# Patient Record
Sex: Female | Born: 1945
Health system: Southern US, Community
[De-identification: ages and names within clinical notes are randomized; demographics above are authoritative.]

## PROBLEM LIST (undated history)

## (undated) DIAGNOSIS — F32A Depression, unspecified: Secondary | ICD-10-CM

## (undated) DIAGNOSIS — H269 Unspecified cataract: Secondary | ICD-10-CM

## (undated) DIAGNOSIS — R569 Unspecified convulsions: Secondary | ICD-10-CM

## (undated) DIAGNOSIS — R519 Headache, unspecified: Secondary | ICD-10-CM

## (undated) DIAGNOSIS — M8580 Other specified disorders of bone density and structure, unspecified site: Secondary | ICD-10-CM

## (undated) DIAGNOSIS — F419 Anxiety disorder, unspecified: Secondary | ICD-10-CM

## (undated) DIAGNOSIS — N189 Chronic kidney disease, unspecified: Secondary | ICD-10-CM

## (undated) DIAGNOSIS — R51 Headache: Secondary | ICD-10-CM

## (undated) DIAGNOSIS — M1711 Unilateral primary osteoarthritis, right knee: Secondary | ICD-10-CM

## (undated) DIAGNOSIS — F329 Major depressive disorder, single episode, unspecified: Secondary | ICD-10-CM

## (undated) DIAGNOSIS — E785 Hyperlipidemia, unspecified: Secondary | ICD-10-CM

## (undated) DIAGNOSIS — I10 Essential (primary) hypertension: Secondary | ICD-10-CM

## (undated) DIAGNOSIS — T7840XA Allergy, unspecified, initial encounter: Secondary | ICD-10-CM

## (undated) HISTORY — DX: Unspecified convulsions: R56.9

## (undated) HISTORY — DX: Unspecified cataract: H26.9

## (undated) HISTORY — DX: Chronic kidney disease, unspecified: N18.9

## (undated) HISTORY — DX: Allergy, unspecified, initial encounter: T78.40XA

## (undated) HISTORY — PX: EYE SURGERY: SHX253

## (undated) HISTORY — PX: FRACTURE SURGERY: SHX138

## (undated) HISTORY — PX: LASIK: SHX215

## (undated) HISTORY — PX: JOINT REPLACEMENT: SHX530

## (undated) HISTORY — PX: BREAST EXCISIONAL BIOPSY: SUR124

---

## 1978-02-13 HISTORY — PX: TUBAL LIGATION: SHX77

## 1995-02-14 HISTORY — PX: BREAST LUMPECTOMY: SHX2

## 2009-02-13 HISTORY — PX: MENISCUS REPAIR: SHX5179

## 2013-02-13 HISTORY — PX: CARPAL TUNNEL RELEASE: SHX101

## 2013-02-13 HISTORY — PX: OTHER SURGICAL HISTORY: SHX169

## 2013-02-25 DIAGNOSIS — J069 Acute upper respiratory infection, unspecified: Secondary | ICD-10-CM | POA: Diagnosis not present

## 2013-04-22 DIAGNOSIS — J0100 Acute maxillary sinusitis, unspecified: Secondary | ICD-10-CM | POA: Diagnosis not present

## 2013-06-11 DIAGNOSIS — L821 Other seborrheic keratosis: Secondary | ICD-10-CM | POA: Diagnosis not present

## 2013-06-11 DIAGNOSIS — D2330 Other benign neoplasm of skin of unspecified part of face: Secondary | ICD-10-CM | POA: Diagnosis not present

## 2013-06-30 DIAGNOSIS — M19049 Primary osteoarthritis, unspecified hand: Secondary | ICD-10-CM | POA: Diagnosis not present

## 2013-06-30 DIAGNOSIS — K219 Gastro-esophageal reflux disease without esophagitis: Secondary | ICD-10-CM | POA: Diagnosis not present

## 2013-06-30 DIAGNOSIS — G5600 Carpal tunnel syndrome, unspecified upper limb: Secondary | ICD-10-CM | POA: Diagnosis not present

## 2013-06-30 DIAGNOSIS — I10 Essential (primary) hypertension: Secondary | ICD-10-CM | POA: Diagnosis not present

## 2013-06-30 DIAGNOSIS — Z01810 Encounter for preprocedural cardiovascular examination: Secondary | ICD-10-CM | POA: Diagnosis not present

## 2013-07-01 DIAGNOSIS — K219 Gastro-esophageal reflux disease without esophagitis: Secondary | ICD-10-CM | POA: Diagnosis not present

## 2013-07-01 DIAGNOSIS — M6520 Calcific tendinitis, unspecified site: Secondary | ICD-10-CM | POA: Diagnosis not present

## 2013-07-01 DIAGNOSIS — M19049 Primary osteoarthritis, unspecified hand: Secondary | ICD-10-CM | POA: Diagnosis not present

## 2013-07-01 DIAGNOSIS — G5600 Carpal tunnel syndrome, unspecified upper limb: Secondary | ICD-10-CM | POA: Diagnosis not present

## 2013-07-01 DIAGNOSIS — I10 Essential (primary) hypertension: Secondary | ICD-10-CM | POA: Diagnosis not present

## 2013-09-16 DIAGNOSIS — J069 Acute upper respiratory infection, unspecified: Secondary | ICD-10-CM | POA: Diagnosis not present

## 2013-10-31 DIAGNOSIS — Z23 Encounter for immunization: Secondary | ICD-10-CM | POA: Diagnosis not present

## 2014-02-24 DIAGNOSIS — D235 Other benign neoplasm of skin of trunk: Secondary | ICD-10-CM | POA: Diagnosis not present

## 2014-03-02 DIAGNOSIS — Z1231 Encounter for screening mammogram for malignant neoplasm of breast: Secondary | ICD-10-CM | POA: Diagnosis not present

## 2014-06-21 LAB — LIPID PANEL
CHOLESTEROL: 132 mg/dL (ref 0–200)
HDL: 51 mg/dL (ref 35–70)
LDL CALC: 61 mg/dL

## 2014-06-21 LAB — BASIC METABOLIC PANEL
BUN: 15 mg/dL (ref 4–21)
CREATININE: 1 mg/dL (ref 0.5–1.1)
GLUCOSE: 102 mg/dL

## 2014-06-21 LAB — HEPATIC FUNCTION PANEL
ALT: 30 U/L (ref 7–35)
AST: 32 U/L (ref 13–35)

## 2014-07-01 DIAGNOSIS — J309 Allergic rhinitis, unspecified: Secondary | ICD-10-CM | POA: Diagnosis not present

## 2014-07-01 DIAGNOSIS — F418 Other specified anxiety disorders: Secondary | ICD-10-CM | POA: Diagnosis not present

## 2014-07-01 DIAGNOSIS — I10 Essential (primary) hypertension: Secondary | ICD-10-CM | POA: Diagnosis not present

## 2014-07-01 DIAGNOSIS — M150 Primary generalized (osteo)arthritis: Secondary | ICD-10-CM | POA: Diagnosis not present

## 2014-07-01 DIAGNOSIS — Z23 Encounter for immunization: Secondary | ICD-10-CM | POA: Diagnosis not present

## 2014-07-01 DIAGNOSIS — E785 Hyperlipidemia, unspecified: Secondary | ICD-10-CM | POA: Diagnosis not present

## 2014-07-01 DIAGNOSIS — G4700 Insomnia, unspecified: Secondary | ICD-10-CM | POA: Diagnosis not present

## 2014-07-01 DIAGNOSIS — K219 Gastro-esophageal reflux disease without esophagitis: Secondary | ICD-10-CM | POA: Diagnosis not present

## 2014-08-05 DIAGNOSIS — Z124 Encounter for screening for malignant neoplasm of cervix: Secondary | ICD-10-CM | POA: Diagnosis not present

## 2014-10-26 DIAGNOSIS — Z23 Encounter for immunization: Secondary | ICD-10-CM | POA: Diagnosis not present

## 2014-11-12 DIAGNOSIS — J0100 Acute maxillary sinusitis, unspecified: Secondary | ICD-10-CM | POA: Diagnosis not present

## 2014-11-12 DIAGNOSIS — R030 Elevated blood-pressure reading, without diagnosis of hypertension: Secondary | ICD-10-CM | POA: Diagnosis not present

## 2014-11-19 DIAGNOSIS — K219 Gastro-esophageal reflux disease without esophagitis: Secondary | ICD-10-CM | POA: Diagnosis not present

## 2014-11-19 DIAGNOSIS — F418 Other specified anxiety disorders: Secondary | ICD-10-CM | POA: Diagnosis not present

## 2014-11-19 DIAGNOSIS — M150 Primary generalized (osteo)arthritis: Secondary | ICD-10-CM | POA: Diagnosis not present

## 2014-11-19 DIAGNOSIS — J0101 Acute recurrent maxillary sinusitis: Secondary | ICD-10-CM | POA: Diagnosis not present

## 2014-11-19 DIAGNOSIS — J301 Allergic rhinitis due to pollen: Secondary | ICD-10-CM | POA: Diagnosis not present

## 2014-11-19 DIAGNOSIS — G4700 Insomnia, unspecified: Secondary | ICD-10-CM | POA: Diagnosis not present

## 2014-12-04 DIAGNOSIS — F3341 Major depressive disorder, recurrent, in partial remission: Secondary | ICD-10-CM | POA: Diagnosis not present

## 2014-12-04 DIAGNOSIS — F411 Generalized anxiety disorder: Secondary | ICD-10-CM | POA: Diagnosis not present

## 2014-12-16 DIAGNOSIS — F411 Generalized anxiety disorder: Secondary | ICD-10-CM | POA: Diagnosis not present

## 2014-12-16 DIAGNOSIS — F3341 Major depressive disorder, recurrent, in partial remission: Secondary | ICD-10-CM | POA: Diagnosis not present

## 2014-12-25 DIAGNOSIS — F3341 Major depressive disorder, recurrent, in partial remission: Secondary | ICD-10-CM | POA: Diagnosis not present

## 2014-12-25 DIAGNOSIS — F411 Generalized anxiety disorder: Secondary | ICD-10-CM | POA: Diagnosis not present

## 2015-01-01 DIAGNOSIS — J301 Allergic rhinitis due to pollen: Secondary | ICD-10-CM | POA: Diagnosis not present

## 2015-01-01 DIAGNOSIS — M8580 Other specified disorders of bone density and structure, unspecified site: Secondary | ICD-10-CM | POA: Diagnosis not present

## 2015-01-01 DIAGNOSIS — G4700 Insomnia, unspecified: Secondary | ICD-10-CM | POA: Diagnosis not present

## 2015-01-01 DIAGNOSIS — M150 Primary generalized (osteo)arthritis: Secondary | ICD-10-CM | POA: Diagnosis not present

## 2015-01-01 DIAGNOSIS — K219 Gastro-esophageal reflux disease without esophagitis: Secondary | ICD-10-CM | POA: Diagnosis not present

## 2015-01-01 DIAGNOSIS — E785 Hyperlipidemia, unspecified: Secondary | ICD-10-CM | POA: Diagnosis not present

## 2015-01-01 DIAGNOSIS — N951 Menopausal and female climacteric states: Secondary | ICD-10-CM | POA: Diagnosis not present

## 2015-01-01 DIAGNOSIS — I10 Essential (primary) hypertension: Secondary | ICD-10-CM | POA: Diagnosis not present

## 2015-01-01 DIAGNOSIS — F418 Other specified anxiety disorders: Secondary | ICD-10-CM | POA: Diagnosis not present

## 2015-03-10 DIAGNOSIS — I10 Essential (primary) hypertension: Secondary | ICD-10-CM | POA: Diagnosis not present

## 2015-03-10 DIAGNOSIS — K219 Gastro-esophageal reflux disease without esophagitis: Secondary | ICD-10-CM | POA: Diagnosis not present

## 2015-03-10 DIAGNOSIS — E785 Hyperlipidemia, unspecified: Secondary | ICD-10-CM | POA: Diagnosis not present

## 2015-03-10 DIAGNOSIS — M25561 Pain in right knee: Secondary | ICD-10-CM | POA: Diagnosis not present

## 2015-03-10 DIAGNOSIS — G4700 Insomnia, unspecified: Secondary | ICD-10-CM | POA: Diagnosis not present

## 2015-03-10 DIAGNOSIS — M150 Primary generalized (osteo)arthritis: Secondary | ICD-10-CM | POA: Diagnosis not present

## 2015-06-14 ENCOUNTER — Ambulatory Visit (INDEPENDENT_AMBULATORY_CARE_PROVIDER_SITE_OTHER): Payer: Medicare Other | Admitting: Family Medicine

## 2015-06-14 ENCOUNTER — Emergency Department (INDEPENDENT_AMBULATORY_CARE_PROVIDER_SITE_OTHER)
Admission: EM | Admit: 2015-06-14 | Discharge: 2015-06-14 | Payer: Medicare Other | Source: Home / Self Care | Attending: Emergency Medicine | Admitting: Emergency Medicine

## 2015-06-14 ENCOUNTER — Encounter: Payer: Self-pay | Admitting: Emergency Medicine

## 2015-06-14 ENCOUNTER — Encounter: Payer: Self-pay | Admitting: Family Medicine

## 2015-06-14 ENCOUNTER — Emergency Department (INDEPENDENT_AMBULATORY_CARE_PROVIDER_SITE_OTHER): Payer: Medicare Other

## 2015-06-14 VITALS — BP 151/84 | HR 86 | Wt 193.0 lb

## 2015-06-14 DIAGNOSIS — F419 Anxiety disorder, unspecified: Secondary | ICD-10-CM | POA: Insufficient documentation

## 2015-06-14 DIAGNOSIS — M25561 Pain in right knee: Secondary | ICD-10-CM | POA: Diagnosis not present

## 2015-06-14 DIAGNOSIS — G4700 Insomnia, unspecified: Secondary | ICD-10-CM | POA: Diagnosis not present

## 2015-06-14 DIAGNOSIS — F5105 Insomnia due to other mental disorder: Secondary | ICD-10-CM

## 2015-06-14 HISTORY — DX: Hyperlipidemia, unspecified: E78.5

## 2015-06-14 HISTORY — DX: Other specified disorders of bone density and structure, unspecified site: M85.80

## 2015-06-14 HISTORY — DX: Essential (primary) hypertension: I10

## 2015-06-14 MED ORDER — TRAZODONE HCL 50 MG PO TABS: 25.0000 mg | ORAL_TABLET | Freq: Every evening | ORAL | Status: DC | PRN

## 2015-06-14 NOTE — Progress Notes (Signed)
Subjective:    I'm seeing this patient as a consultation for:  Marcene Brawn PA-C  CC: Right knee pain  HPI: Patient is a 4 month history of right medial knee pain associated with swelling. She denies any injury but notes that her pain started after she moved house and a lot of packing bending and squatting pushing and pulling. She last had a knee injection in January and [place] at her former PCPs office. She's tried some over-the-counter medicines which helped only a little. She also notes a history of medial degenerative meniscus tear with arthroscopic partial meniscectomy. This helped for a little while as well. He denies any locking catching or giving way. Pain is daily and interfere with her ability to do normal activities.  Past medical history, Surgical history, Family history not pertinant except as noted below, Social history, Allergies, and medications have been entered into the medical record, reviewed, and no changes needed.   Review of Systems: No headache, visual changes, nausea, vomiting, diarrhea, constipation, dizziness, abdominal pain, skin rash, fevers, chills, night sweats, weight loss, swollen lymph nodes, body aches, joint swelling, muscle aches, chest pain, shortness of breath, mood changes, visual or auditory hallucinations.   Objective:    Filed Vitals:   06/14/15 0943  BP: 151/84  Pulse: 86   General: Well Developed, well nourished, and in no acute distress.  Neuro/Psych: Alert and oriented x3, extra-ocular muscles intact, able to move all 4 extremities, sensation grossly intact. Skin: Warm and dry, no rashes noted.  Respiratory: Not using accessory muscles, speaking in full sentences, trachea midline.  Cardiovascular: Pulses palpable, no extremity edema. Abdomen: Does not appear distended. MSK: Right knee is normal-appearing with mild effusion. Range of motion 0-120 with 1+ retropatellar crepitations. Tender to palpation medial joint line. Mildly  positive medial McMurray's test. Stable valgus and varus stress. Stable and negative anterior posterior drawer test. Normal gait.  Procedure: Real-time Ultrasound Guided Injection of right knee  Device: GE Logiq E  Images permanently stored and available for review in the ultrasound unit. Verbal informed consent obtained. Discussed risks and benefits of procedure. Warned about infection bleeding damage to structures skin hypopigmentation and fat atrophy among others. Patient expresses understanding and agreement Time-out conducted.  Noted no overlying erythema, induration, or other signs of local infection.  Skin prepped in a sterile fashion.  Local anesthesia: Topical Ethyl chloride.  With sterile technique and under real time ultrasound guidance: 80 mg of Kenalog and 4 mL of Marcaine injected easily.  Completed without difficulty  Pain immediately resolved suggesting accurate placement of the medication.  Advised to call if fevers/chills, erythema, induration, drainage, or persistent bleeding.  Images permanently stored and available for review in the ultrasound unit.  Impression: Technically successful ultrasound guided injection.    No results found for this or any previous visit (from the past 24 hour(s)). Dg Knee Complete 4 Views Right  06/14/2015  CLINICAL DATA:  RIGHT knee pain for a few months since packing and unpacking her house in January, history of meniscal tear and fracture EXAM: RIGHT KNEE - COMPLETE 4+ VIEW COMPARISON:  None FINDINGS: Osseous demineralization. Tricompartmental osteoarthritic changes with joint space narrowing and spur formation particularly at medial compartment. No acute fracture, dislocation or bone destruction. Calcified ossicle posteriorly at the knee joint on lateral view, likely at lateral aspect of medial compartment, question ununited ossicle versus old fracture fragment. No knee joint effusion or regional soft tissue abnormalities.  IMPRESSION: Osseous demineralization with tricompartmental osteoarthritic changes  as above. No acute abnormalities. Electronically Signed   By: Lavonia Dana M.D.   On: 06/14/2015 09:44    Impression and Recommendations:   This case required medical decision making of moderate complexity.

## 2015-06-14 NOTE — ED Notes (Signed)
Rt knee pain, Had meniscus repair 5 yrs ago, in January this year was packing boxes for move here, had injection for pain helped temporarily but hurts intermittently, affecting her life, painful, slightly swollen, feels full, 5/10

## 2015-06-14 NOTE — ED Provider Notes (Signed)
CSN: [id number]     Arrival date & time 06/14/15  [id number] History   First MD Initiated Contact with Patient 06/14/15 [phone]     Chief Complaint  Patient presents with  . Knee Pain   (Consider location/radiation/quality/duration/timing/severity/associated sxs/prior Treatment) Patient is a 70 y.o. female presenting with knee pain. The history is provided by the patient. No language interpreter was used.  Knee Pain Location:  Knee Time since incident:  4 months Injury: no   Knee location:  R knee Pain details:    Quality:  Aching   Radiates to:  Does not radiate   Severity:  Moderate   Onset quality:  Gradual   Timing:  Constant Chronicity:  New Dislocation: no   Foreign body present:  No foreign bodies Relieved by:  Nothing Worsened by:  Nothing tried Ineffective treatments:  None tried Associated symptoms: decreased ROM and stiffness   Risk factors: no concern for non-accidental trauma and no recent illness   pain is interferring with walking and activities.  Pt previously had a cortisone shot at her MD in [place].  Pt moved here.  Past Medical History  Diagnosis Date  . Hypertension   . Hyperlipidemia   . Osteopenia    Past Surgical History  Procedure Laterality Date  . Meniscus repair    . Trigger thumb    . Carpal tunnel release    . Breast lumpectomy     Family History  Problem Relation Age of Onset  . Heart failure Mother    Social History  Substance Use Topics  . Smoking status: Never Smoker   . Smokeless tobacco: None  . Alcohol Use: Yes   OB History    No data available     Review of Systems  Musculoskeletal: Positive for stiffness.  All other systems reviewed and are negative.   Allergies  Penicillins and Sulfa antibiotics  Home Medications   Prior to Admission medications   Medication Sig Start Date End Date Taking? Authorizing Provider  acetaminophen (TYLENOL) 325 MG tablet Take 650 mg by mouth every 6 (six) hours as needed.   Yes Historical  Provider, MD  atorvastatin (LIPITOR) 20 MG tablet Take 20 mg by mouth daily.   Yes Historical Provider, MD  fluticasone (FLONASE) 50 MCG/ACT nasal spray Place into both nostrils daily.   Yes Historical Provider, MD  ibuprofen (ADVIL,MOTRIN) 200 MG tablet Take 200 mg by mouth every 6 (six) hours as needed.   Yes Historical Provider, MD  raloxifene (EVISTA) 60 MG tablet Take 60 mg by mouth daily.   Yes Historical Provider, MD  triamterene-hydrochlorothiazide (DYAZIDE) 37.5-25 MG capsule Take 1 capsule by mouth daily.   Yes Historical Provider, MD  venlafaxine XR (EFFEXOR-XR) 150 MG 24 hr capsule Take 150 mg by mouth daily with breakfast.   Yes Historical Provider, MD   Meds Ordered and Administered this Visit  Medications - No data to display  BP 145/75 mmHg  Pulse 95  Temp(Src) 97.8 F (36.6 C) (Oral)  Ht 5\' 3"  (1.6 m)  Wt 190 lb (86.183 kg)  BMI 33.67 kg/m2  SpO2 98% No data found.   Physical Exam  Constitutional: She appears well-developed and well-nourished.  HENT:  Head: Normocephalic.  Musculoskeletal: Normal range of motion. She exhibits tenderness.  Neurological: She is alert.  Skin: Skin is warm.  Psychiatric: She has a normal mood and affect.  Nursing note and vitals reviewed.   ED Course  Procedures (including critical care time)  Labs Review Labs  Reviewed - No data to display  Imaging Review Dg Knee Complete 4 Views Right  06/14/2015  CLINICAL DATA:  RIGHT knee pain for a few months since packing and unpacking her house in January, history of meniscal tear and fracture EXAM: RIGHT KNEE - COMPLETE 4+ VIEW COMPARISON:  None FINDINGS: Osseous demineralization. Tricompartmental osteoarthritic changes with joint space narrowing and spur formation particularly at medial compartment. No acute fracture, dislocation or bone destruction. Calcified ossicle posteriorly at the knee joint on lateral view, likely at lateral aspect of medial compartment, question ununited ossicle  versus old fracture fragment. No knee joint effusion or regional soft tissue abnormalities. IMPRESSION: Osseous demineralization with tricompartmental osteoarthritic changes as above. No acute abnormalities. Electronically Signed   By: Lavonia Dana M.D.   On: 06/14/2015 09:44     Visual Acuity Review  Right Eye Distance:   Left Eye Distance:   Bilateral Distance:    Right Eye Near:   Left Eye Near:    Bilateral Near:         MDM Xray obtained,  Dr. Georgina Snell has appointment this am.   Pt to see Dr. Georgina Snell for evaluation now   1. Right knee pain    Discharge by Dr. Alphonzo Cruise, PA-C 06/14/15 [phone]

## 2015-06-14 NOTE — Assessment & Plan Note (Signed)
I believe the majority of the pain is due to exacerbation of his existing DJD. This highly possible that she has a degenerative medial meniscus tear but given her age and the existence of significant DJD I am doubtful that she would benefit much from arthroscopic partial meniscectomy. Plan for a trial of steroid injection today. Additionally will work on quad strengthening exercises. Recheck in about a month. If not better would consider viscous supplementation trial.

## 2015-06-14 NOTE — Patient Instructions (Signed)
Thank you for coming in today. Take trazodone for insomnia as needed.  Do the exercises for you leg.  Return in 1 month for recheck.  Follow up with Me or Dr Sheppard Coil to establish care for your medical problems.   Call or go to the ER if you develop a large red swollen joint with extreme pain or oozing puss.

## 2015-06-28 ENCOUNTER — Telehealth: Payer: Self-pay | Admitting: Family Medicine

## 2015-06-28 ENCOUNTER — Encounter: Payer: Self-pay | Admitting: Family Medicine

## 2015-06-28 ENCOUNTER — Ambulatory Visit (INDEPENDENT_AMBULATORY_CARE_PROVIDER_SITE_OTHER): Payer: Medicare Other | Admitting: Family Medicine

## 2015-06-28 VITALS — BP 139/83 | HR 100 | Wt 195.0 lb

## 2015-06-28 DIAGNOSIS — M25561 Pain in right knee: Secondary | ICD-10-CM | POA: Diagnosis not present

## 2015-06-28 NOTE — Patient Instructions (Signed)
Thank you for coming in today. We will try orthovisc.  Expect a call this week about the when it will be done.   Sodium Hyaluronate intra-articular injection What is this medicine? SODIUM HYALURONATE (SOE dee um hye al yoor ON ate) is used to treat pain in the knee due to osteoarthritis. This medicine may be used for other purposes; ask your health care provider or pharmacist if you have questions. What should I tell my health care provider before I take this medicine? They need to know if you have any of these conditions: -bleeding disorders -glaucoma -infection in the knee joint -skin conditions or sensitivity -skin infection -an unusual allergic reaction to sodium hyaluronate, other medicines, foods, dyes, or preservatives. Different brands of sodium hyaluronate contain different allergens. Some may contain egg. Talk to your doctor about your allergies to make sure that you get the right product. -pregnant or trying to get pregnant -breast-feeding How should I use this medicine? This medicine is for injection into the knee joint. It is given by a health care professional in a hospital or clinic setting. Talk to your pediatrician regarding the use of this medicine in children. Special care may be needed. Overdosage: If you think you have taken too much of this medicine contact a poison control center or emergency room at once. NOTE: This medicine is only for you. Do not share this medicine with others. What if I miss a dose? This does not apply. What may interact with this medicine? Interactions are not expected. This list may not describe all possible interactions. Give your health care provider a list of all the medicines, herbs, non-prescription drugs, or dietary supplements you use. Also tell them if you smoke, drink alcohol, or use illegal drugs. Some items may interact with your medicine. What should I watch for while using this medicine? Tell your doctor or healthcare  professional if your symptoms do not start to get better or if they get worse. If receiving this medicine for osteoarthritis, limit your activity after you receive your injection. Avoid physical activity for 48 hours following your injection to keep your knee from swelling. Do not stand on your feet for more than 1 hour at a time during the first 48 hours following your injection. Ask your doctor or healthcare professional about when you can begin major physical activity again. What side effects may I notice from receiving this medicine? Side effects that you should report to your doctor or health care professional as soon as possible: -allergic reactions like skin rash, itching or hives, swelling of the face, lips, or tongue -dizziness -facial flushing -pain, tingling, numbness in the hands or feet -vision changes if received this medicine during eye surgery Side effects that usually do not require medical attention (Report these to your doctor or health care professional if they continue or are bothersome.): -back pain -bruising at site where injected -chills -diarrhea -fever -headache -joint pain -joint stiffness -joint swelling -muscle cramps -muscle pain -nausea, vomiting -pain, redness, or irritation at site where injected -weak or tired This list may not describe all possible side effects. Call your doctor for medical advice about side effects. You may report side effects to FDA at 1-800-FDA-1088. Where should I keep my medicine? This drug is given in a hospital or clinic and will not be stored at home. NOTE: This sheet is a summary. It may not cover all possible information. If you have questions about this medicine, talk to your doctor, pharmacist, or health care  provider.    2016, Elsevier/Gold Standard. (2014-03-10 11:10:34)

## 2015-06-28 NOTE — Telephone Encounter (Signed)
Submitted for approval on Orthovisc. Awaiting confirmation.  

## 2015-06-28 NOTE — Progress Notes (Signed)
       [name] is a 70 y.o. female who presents to [facility] today for right knee pain. Patient was seen in early May for right knee pain due to DJD. She received a steroid injection which had very little benefit. She notes chronic daily pain is interfering with her quality of life.   Past Medical History  Diagnosis Date  . Hypertension   . Hyperlipidemia   . Osteopenia    Past Surgical History  Procedure Laterality Date  . Meniscus repair    . Trigger thumb    . Carpal tunnel release    . Breast lumpectomy     Social History  Substance Use Topics  . Smoking status: Never Smoker   . Smokeless tobacco: Not on file  . Alcohol Use: Yes   family history includes Heart failure in her mother.  ROS as above Medications: Current Outpatient Prescriptions  Medication Sig Dispense Refill  . acetaminophen (TYLENOL) 325 MG tablet Take 650 mg by mouth every 6 (six) hours as needed.    Marland Kitchen atorvastatin (LIPITOR) 20 MG tablet Take 20 mg by mouth daily.    . fluticasone (FLONASE) 50 MCG/ACT nasal spray Place into both nostrils daily.    Marland Kitchen ibuprofen (ADVIL,MOTRIN) 200 MG tablet Take 200 mg by mouth every 6 (six) hours as needed.    . raloxifene (EVISTA) 60 MG tablet Take 60 mg by mouth daily.    . traZODone (DESYREL) 50 MG tablet Take 0.5-1 tablets (25-50 mg total) by mouth at bedtime as needed for sleep. 30 tablet 3  . triamterene-hydrochlorothiazide (DYAZIDE) 37.5-25 MG capsule Take 1 capsule by mouth daily.    Marland Kitchen venlafaxine XR (EFFEXOR-XR) 150 MG 24 hr capsule Take 150 mg by mouth daily with breakfast.     No current facility-administered medications for this visit.   Allergies  Allergen Reactions  . Penicillins   . Sulfa Antibiotics      Exam:  BP 139/83 mmHg  Pulse 100  Wt 195 lb (88.451 kg) Gen: Well NAD Right knee normal-appearing no effusion. Normal range of motion  without patellar crepitations. Normal gait.  X-ray shows significant tricompartmental DJD.  No results found for this or any previous visit (from the past 24 hour(s)). No results found.   23-year-old woman with right knee pain due to DJD likely. Patient has failed steroid injection. Plan for trial of Orthovisc. Return in the near future for Orthovisc injection series.

## 2015-06-29 NOTE — Telephone Encounter (Signed)
Received the following information from OV benefits investigation:   AS PER MEDICARE GUIDELINES, A STANDARD COURSE OF TREATMENT FOR ORTHOVISC is 3-4 INJECTIONS PER KNEE IN A 6 MONTH PERIOD. ADDITIONAL INJECTIONS MAY NOT BE CONSIDERED REASONABLE AND NECESSARY AND MAY DENY. IN ORDER FOR SERVICES TO BE APPROVED THERE MUST BE RADIOLOGICAL EVIDENCE TO SUPPORT THE DIAGNOSIS OF OSTEOARTHRITIS; AND PATIENT MUST HAVE TRIED AND FAILED WITH CONSERVATIVE FORMS OF TREATMENT. PLAN MAY REQUEST MEDICAL NOTES UPON RECEIPT OF CLAIM. FOLLOWS ALL MEDICARE GUIDELINES.   Left information on husband Fritz Pickerel) VM. The phone number we have on file for Pt is not working.

## 2015-06-29 NOTE — Telephone Encounter (Signed)
Pt is OK to schedule for OV injections, can start anytime.

## 2015-07-02 ENCOUNTER — Encounter: Payer: Self-pay | Admitting: Family Medicine

## 2015-07-02 ENCOUNTER — Ambulatory Visit (INDEPENDENT_AMBULATORY_CARE_PROVIDER_SITE_OTHER): Payer: Medicare Other | Admitting: Family Medicine

## 2015-07-02 VITALS — BP 157/74 | HR 101 | Wt 196.0 lb

## 2015-07-02 DIAGNOSIS — M25561 Pain in right knee: Secondary | ICD-10-CM | POA: Diagnosis not present

## 2015-07-02 NOTE — Patient Instructions (Signed)
Thank you for coming in today. Return in 1 week.  Call or go to the ER if you develop a large red swollen joint with extreme pain or oozing puss.   

## 2015-07-02 NOTE — Progress Notes (Signed)
Patient presents to clinic today for previously arranged Orthovisc injection.  Procedure: Real-time Ultrasound Guided Injection of right knee Orthovisc 1/4 Device: GE Logiq E  Images permanently stored and available for review in the ultrasound unit. Verbal informed consent obtained. Discussed risks and benefits of procedure. Warned about infection bleeding damage to structures skin hypopigmentation and fat atrophy among others. Patient expresses understanding and agreement Time-out conducted.  Noted no overlying erythema, induration, or other signs of local infection.  Skin prepped in a sterile fashion.  Local anesthesia: Topical Ethyl chloride.  With sterile technique and under real time ultrasound guidance: Orthovisc injected easily.  Completed without difficulty   Advised to call if fevers/chills, erythema, induration, drainage, or persistent bleeding.  Images permanently stored and available for review in the ultrasound unit.  Impression: Technically successful ultrasound guided injection.  Ultrasound guidance needed due to body habitus and to ensure accurate placement of medication.  Return to clinic in one week for Orthovisc injection 2/4.

## 2015-07-09 ENCOUNTER — Encounter: Payer: Self-pay | Admitting: Family Medicine

## 2015-07-09 ENCOUNTER — Ambulatory Visit (INDEPENDENT_AMBULATORY_CARE_PROVIDER_SITE_OTHER): Payer: Medicare Other | Admitting: Family Medicine

## 2015-07-09 VITALS — BP 131/78 | HR 96 | Wt 195.0 lb

## 2015-07-09 DIAGNOSIS — M25561 Pain in right knee: Secondary | ICD-10-CM | POA: Diagnosis not present

## 2015-07-09 NOTE — Progress Notes (Signed)
  Patient presents to clinic today for previously arranged Orthovisc injection.  Procedure: Real-time Ultrasound Guided Injection of right knee Orthovisc 2/4 Device: GE Logiq E  Images permanently stored and available for review in the ultrasound unit. Verbal informed consent obtained. Discussed risks and benefits of procedure. Warned about infection bleeding damage to structures skin hypopigmentation and fat atrophy among others. Patient expresses understanding and agreement Time-out conducted.  Noted no overlying erythema, induration, or other signs of local infection.  Skin prepped in a sterile fashion.  Local anesthesia: Topical Ethyl chloride.  With sterile technique and under real time ultrasound guidance: Orthovisc injected easily.  Completed without difficulty   Advised to call if fevers/chills, erythema, induration, drainage, or persistent bleeding.  Images permanently stored and available for review in the ultrasound unit.  Impression: Technically successful ultrasound guided injection.  Ultrasound guidance needed due to body habitus and to ensure accurate placement of medication.  Return to clinic in one week for Orthovisc injection 3/4.

## 2015-07-09 NOTE — Patient Instructions (Addendum)
Thank you for coming in today. Call or go to the ER if you develop a large red swollen joint with extreme pain or oozing puss.  Return in 1 week for injection 3/4

## 2015-07-15 ENCOUNTER — Ambulatory Visit: Payer: Medicare Other | Admitting: Family Medicine

## 2015-07-16 ENCOUNTER — Ambulatory Visit (INDEPENDENT_AMBULATORY_CARE_PROVIDER_SITE_OTHER): Payer: Medicare Other | Admitting: Family Medicine

## 2015-07-16 DIAGNOSIS — M25561 Pain in right knee: Secondary | ICD-10-CM

## 2015-07-16 NOTE — Patient Instructions (Signed)
Thank you for coming in today. You should hear from [facility] soon.   Total Knee Replacement Total knee replacement is a procedure to replace your knee joint with an artificial knee joint (prosthetic knee joint). The purpose of this surgery is to reduce pain and improve your knee function. LET [facility] CARE PROVIDER KNOW ABOUT:   Any allergies you have.  All medicines you are taking, including vitamins, herbs, eye drops, creams, and over-the-counter medicines.  Previous problems you or members of your family have had with the use of anesthetics.  Any blood disorders you have.  Previous surgeries you have had.  Medical conditions you have. RISKS AND COMPLICATIONS  Generally, total knee replacement is a safe procedure. However, problems can occur, including:  Loss of range of motion of the knee or instability of the knee.  Loosening of the prosthesis.  Infection.  Persistent pain. BEFORE THE PROCEDURE   Plan to have someone take you home after the procedure.  Do not eat or drink anything after midnight on the night before the procedure or as directed by your health care provider.  Ask your health care provider about:  Changing or stopping your regular medicines. This is especially important if you are taking diabetes medicines or blood thinners.  Taking medicines such as aspirin and ibuprofen. These medicines can thin your blood. Do not take these medicines before your procedure if your health care provider asks you not to.  Ask your health care provider about how your surgical site will be marked or identified.  You may be given antibiotic medicines to help prevent infection. PROCEDURE   To reduce your risk of infection:  Your health care team will wash or sanitize their hands.  Your skin will be washed with soap.  An IV tube will be inserted into one of your veins. You will be given one or more of the following:  A medicine that makes you drowsy  (sedative).  A medicine that makes you fall asleep (general anesthetic).  A medicine injected into your spine that numbs your body below the waist (spinal anesthetic).  A medicine to block feeling in your leg (nerve block) to help ease pain after surgery.  An incision will be made in your knee. Your surgeon will take out any damaged cartilage and bone by sawing off the damaged surfaces.  The surgeon will then put a new metal liner over the sawed-off portion of your thigh bone (femur) and a plastic liner over the sawed-off portion of one of the bones of your lower leg (tibia). This is to restore alignment and function to your knee. A plastic piece is often used to restore the surface of your knee cap. AFTER THE PROCEDURE   You will stay in a recovery area until your medicines wear off.  You may have drainage tubes to drain excess fluid from your knee. These tubes attach to a device that removes these fluids.  Once you are awake, stable, and taking fluids well, you will be taken to your hospital room.  You may be directed to take actions to help prevent blood clots. These may include:  Walking shortly after surgery, with someone assisting you. Moving around after surgery helps to improve blood flow.  Wearing compression stockings or using different types of devices.  You will receive physical therapy as prescribed by your health care provider.   This information is not intended to replace advice given to you by your health care provider. Make sure  you discuss any questions you have with your health care provider.   Document Released: 05/08/2000 Document Revised: 10/21/2014 Document Reviewed: 03/12/2011 Elsevier Interactive Patient Education [employer].

## 2015-07-18 NOTE — Progress Notes (Signed)
       [name] is a 70 y.o. female who presents to [facility]: [place] today for right knee pain.   Patient has had 2/4 injections for orthovisc for left knee pain though to be due to DJD. She has failed steroid injection and feels that she is not ANY better with the orthovisc injection series. She would like to stop the injections because they are not helping.    Past Medical History  Diagnosis Date  . Hypertension   . Hyperlipidemia   . Osteopenia    Past Surgical History  Procedure Laterality Date  . Meniscus repair    . Trigger thumb    . Carpal tunnel release    . Breast lumpectomy     Social History  Substance Use Topics  . Smoking status: Never Smoker   . Smokeless tobacco: Not on file  . Alcohol Use: Yes   family history includes Heart failure in her mother.  ROS as above:  Medications: Current Outpatient Prescriptions  Medication Sig Dispense Refill  . acetaminophen (TYLENOL) 325 MG tablet Take 650 mg by mouth every 6 (six) hours as needed.    Marland Kitchen atorvastatin (LIPITOR) 20 MG tablet Take 20 mg by mouth daily.    . fluticasone (FLONASE) 50 MCG/ACT nasal spray Place into both nostrils daily.    Marland Kitchen ibuprofen (ADVIL,MOTRIN) 200 MG tablet Take 200 mg by mouth every 6 (six) hours as needed.    . raloxifene (EVISTA) 60 MG tablet Take 60 mg by mouth daily.    . traZODone (DESYREL) 50 MG tablet Take 0.5-1 tablets (25-50 mg total) by mouth at bedtime as needed for sleep. 30 tablet 3  . triamterene-hydrochlorothiazide (DYAZIDE) 37.5-25 MG capsule Take 1 capsule by mouth daily.    Marland Kitchen venlafaxine XR (EFFEXOR-XR) 150 MG 24 hr capsule Take 150 mg by mouth daily with breakfast.     No current facility-administered medications for this visit.   Allergies  Allergen Reactions  . Penicillins   . Sulfa Antibiotics      Exam:  Gen: Well NAD Right knee normal-appearing  no effusion. Normal range of motion without patellar crepitations. Normal gait.  X-ray shows significant tricompartmental DJD.  No results found for this or any previous visit (from the past 24 hour(s)). No results found.    Assessment and Plan: 70 y.o. female with Right knee pain. Likely due to DJD. Pt failed trial of conservative management including steroid and viscosupplementation injections. Plan to refer to orthopedics for consultation regarding joint replacement.  Discussed warning signs or symptoms. Please see discharge instructions. Patient expresses understanding.

## 2015-07-20 ENCOUNTER — Encounter: Payer: Self-pay | Admitting: Family Medicine

## 2015-07-21 ENCOUNTER — Ambulatory Visit: Payer: Self-pay | Admitting: Internal Medicine

## 2015-07-27 DIAGNOSIS — M1711 Unilateral primary osteoarthritis, right knee: Secondary | ICD-10-CM | POA: Diagnosis not present

## 2015-07-28 ENCOUNTER — Ambulatory Visit: Payer: Medicare Other | Admitting: Osteopathic Medicine

## 2015-07-30 DIAGNOSIS — M25561 Pain in right knee: Secondary | ICD-10-CM | POA: Diagnosis not present

## 2015-08-02 DIAGNOSIS — M25561 Pain in right knee: Secondary | ICD-10-CM | POA: Diagnosis not present

## 2015-08-04 ENCOUNTER — Encounter: Payer: Self-pay | Admitting: Osteopathic Medicine

## 2015-08-04 ENCOUNTER — Ambulatory Visit (INDEPENDENT_AMBULATORY_CARE_PROVIDER_SITE_OTHER): Payer: Medicare Other | Admitting: Osteopathic Medicine

## 2015-08-04 VITALS — BP 139/72 | HR 84 | Ht 63.0 in | Wt 197.0 lb

## 2015-08-04 DIAGNOSIS — F418 Other specified anxiety disorders: Secondary | ICD-10-CM

## 2015-08-04 DIAGNOSIS — E785 Hyperlipidemia, unspecified: Secondary | ICD-10-CM | POA: Diagnosis not present

## 2015-08-04 DIAGNOSIS — G4700 Insomnia, unspecified: Secondary | ICD-10-CM

## 2015-08-04 DIAGNOSIS — I10 Essential (primary) hypertension: Secondary | ICD-10-CM | POA: Insufficient documentation

## 2015-08-04 DIAGNOSIS — M8580 Other specified disorders of bone density and structure, unspecified site: Secondary | ICD-10-CM | POA: Diagnosis not present

## 2015-08-04 DIAGNOSIS — M25561 Pain in right knee: Secondary | ICD-10-CM

## 2015-08-04 MED ORDER — TEMAZEPAM 30 MG PO CAPS: 30.0000 mg | ORAL_CAPSULE | Freq: Every evening | ORAL | Status: DC | PRN

## 2015-08-04 MED ORDER — RALOXIFENE HCL 60 MG PO TABS: 60.0000 mg | ORAL_TABLET | Freq: Every day | ORAL | Status: DC

## 2015-08-04 MED ORDER — CLONAZEPAM 0.5 MG PO TABS: 0.5000 mg | ORAL_TABLET | Freq: Two times a day (BID) | ORAL | Status: DC | PRN

## 2015-08-04 NOTE — Patient Instructions (Signed)
DR. Redgie Grayer IMPORTANT  INFORMATION FOR NEW PATIENTS!  PLEASE REVIEW CAREFULLY!    FOLLOW-UP!   Let's plan to follow-up in the office in October/November for [facility].   Long term, let's plan to follow-up every 4 - 6 months for management/monitoring of chronic medical issues such as high blood pressure, as well as management of your prescription medications.   Please don't hesitate to make an appointment sooner if you're having any acute concerns or problems!  REGARDING PRESCRIPTION MEDICATIONS  Please let your pharmacy know when you are running low on medications/refills (do not wait until you are out of medicines). Your pharmacy will send our office a request for the appropriate medications. Please allow our office 2-3 business days to process the needed refills.   For controlled substances, you may be asked to schedule an office visit and/or undergo a random urine drug screen for continuation of such medications.   REGARDING ANY CARE YOU RECEIVE OUTSIDE OUR OFFICE  At any visits to any specialists, or if you receive vaccines anywhere outside our office, please provide our clinic information so that they can forward [place] any records, including any tests which are done, changes to your medications, or new vaccinations.   Also, if you are ever treated in an emergency room or if you are admitted to the hospital, please contact our office after your discharge. We encourage our patients to schedule a follow-up visit with their PCP any time they are treated for a serious illness or injury.   This allows all your physicians to communicate effectively, putting your primary care doctor at the center of your medical care and allowing [place] to effectively coordinate your care.  REGARDING PREVENTIVE CARE & WELLNESS, AKA "ANNUAL PHYSICAL"  Let's plan to follow-up here in the office in October, 2017 months for [place].   This visit is very important to make  sure we talk with our patients about all recommended cancer screenings, vaccines, birth control (if desired) and screenings for other diseases based on your personal/family history. Plus, this visit should be completely covered under your insurance!   We also like to talk about any changes to your health over the past year and make sure any chronic conditions are well-controlled.    If you would like to, you can get routine lab work done 2 - 3 days before your physical so that we can go over the results in person at your appointment. Please call our office a week before that appointment so we can make sure the lab has the appropriate orders for your blood draw.   Please note: insurance should completely cover one preventive care visit annually, and they should completely cover most tests associated with preventive care such as routine labs, mammograms, etc. If you have any other medical concerns to address, you may be asked to reschedule your annual physical, or schedule a separate visit to address other medical concerns. Or, you may be billed for care related to "problem-based visit" in addition to your "preventive care visit." If you have questions about this, please contact our office, your insurance company, or [facility] billing department.   REGARDING ANY FORMS NEEDING YOUR DOCTOR'S SIGNATURE  If you ever have any paperwork which needs to be completed by your PCP, you may be asked to come to the office if that paperwork requires a complex review of your medical history - we want to make sure everything is completed to your satisfaction and is completed  correctly the first time, particularly with FMLA or other employment/legal matters!    Please let us know if there is anything else we can do for you. Take care! -Dr. Loni Muse.

## 2015-08-04 NOTE — Progress Notes (Signed)
HPI: [name] is a 70 y.o. female who presents to [facility] today for chief complaint of:  Chief Complaint  Patient presents with  . Establish Care    INSOMNIA - On Temazepam for sleep and anxiety problems. Has previously been on other medications (perhaps barbiturates, definitely Ambien but didn't like this, other meds since her 25s). Clonazepam for anxiety Rx bid prn    DEPRESSION/ANXIETY - 20+ years ago was on Prozac, currently on Effexor, Zoloft caused dry mouth. Effexor now, was off for about a year and had mood issues so went back on this. Still having irritability/mood issues.   HYPERTENSION - fairly good control on current meds, no CP/SOB. Pain in knee makes BP go up/  OSTEOPENIA - On Vitamin D3 and Raloxifene.   HYPERLIPIDEMIA - On statin. Takes baby ASA.    Past medical, social and family history reviewed: Past Medical History  Diagnosis Date  . Hypertension   . Hyperlipidemia   . Osteopenia    Past Surgical History  Procedure Laterality Date  . Meniscus repair    . Trigger thumb    . Carpal tunnel release    . Breast lumpectomy     Social History  Substance Use Topics  . Smoking status: Never Smoker   . Smokeless tobacco: Not on file  . Alcohol Use: Yes   Family History  Problem Relation Age of Onset  . Heart failure Mother     Current Outpatient Prescriptions  Medication Sig Dispense Refill  . atorvastatin (LIPITOR) 20 MG tablet Take 20 mg by mouth daily.    . fluticasone (FLONASE) 50 MCG/ACT nasal spray Place into both nostrils daily.    Marland Kitchen ibuprofen (ADVIL,MOTRIN) 200 MG tablet Take 200 mg by mouth every 6 (six) hours as needed.    . raloxifene (EVISTA) 60 MG tablet Take 60 mg by mouth daily.    Marland Kitchen triamterene-hydrochlorothiazide (DYAZIDE) 37.5-25 MG capsule Take 1 capsule by mouth daily.    Marland Kitchen venlafaxine XR (EFFEXOR-XR) 150 MG 24 hr capsule Take 150 mg by mouth daily with breakfast.    . amLODipine-benazepril  (LOTREL) 5-20 MG capsule     . clonazePAM (KLONOPIN) 0.5 MG tablet     . predniSONE (DELTASONE) 5 MG tablet     . temazepam (RESTORIL) 30 MG capsule      No current facility-administered medications for this visit.   Allergies  Allergen Reactions  . Penicillins   . Sulfa Antibiotics       Review of Systems: CONSTITUTIONAL:  No  fever, no chills, No recent illness, No unintentional weight changes HEAD/EYES/EARS/NOSE/THROAT: No  headache, no vision change, no hearing change, No sore throat, No  sinus pressure CARDIAC: No  chest pain, No  pressure, No palpitations, No  orthopnea RESPIRATORY: No  cough, No  shortness of breath/wheeze GASTROINTESTINAL: No  nausea, No  vomiting, No  abdominal pain, No  blood in stool, No  diarrhea, No  constipation  MUSCULOSKELETAL: No  myalgia/arthralgia GENITOURINARY: No  incontinence, No  abnormal genital bleeding/discharge SKIN: No  rash/wounds/concerning lesions HEM/ONC: No  easy bruising/bleeding, No  abnormal lymph node ENDOCRINE: No polyuria/polydipsia/polyphagia, No  heat/cold intolerance  NEUROLOGIC: No  weakness, No  dizziness, No  slurred speech PSYCHIATRIC: No  concerns with depression, No  concerns with anxiety, No sleep problems  Exam:  BP 139/72 mmHg  Pulse 84  Ht 5\' 3"  (1.6 m)  Wt 197 lb (89.359 kg)  BMI 34.91 kg/m2 Constitutional: VS see above.  General Appearance: alert, well-developed, well-nourished, NAD Eyes: Normal lids and conjunctive Ears, Nose, Mouth, Throat: MMM, Normal external inspection ears/nares/mouth/lips/gums Neck: No masses, trachea midline. No thyroid enlargement. No tenderness/mass appreciated. No lymphadenopathy Respiratory: Normal respiratory effort. no wheeze, no rhonchi, no rales Cardiovascular: S1/S2 normal, no murmur, no rub/gallop auscultated. RRR. No lower extremity edema. Skin: warm, dry, intact. No rash/ulcer. Psychiatric: Normal judgment/insight. Normal mood and affect. Oriented x3.      ASSESSMENT/PLAN: Advised on long-term use of benzos and cautions re: sedation, delirium, dependence. Patient reports that these are the only meds which will help with her panic and insomnia and she has been taking them for many years. I'm ok to continue for now, but once recovered from upcoming surgery will encourage consideration of other medication options and possibly referral to psychiatry. If needs anything for pre-surgical clearance, let me know. Await previous records re: labs and other preventive care measures.   Insomnia - Plan: temazepam (RESTORIL) 30 MG capsule  Essential hypertension - Plan: amLODipine-benazepril (LOTREL) 5-20 MG capsule, aspirin 81 MG tablet  Hyperlipidemia  Osteopenia - Plan: raloxifene (EVISTA) 60 MG tablet  Anxiety associated with depression - Plan: clonazePAM (KLONOPIN) 0.5 MG tablet  Right knee pain - Plan: predniSONE (DELTASONE) 5 MG tablet     All questions were answered. Visit summary with medication list and pertinent instructions was printed for patient to review. ER/RTC precautions were reviewed with the patient. Return in about 4 months (around 12/04/2015), or sooner if needed, for [place] / LABS.

## 2015-08-12 ENCOUNTER — Telehealth: Payer: Self-pay | Admitting: Osteopathic Medicine

## 2015-08-12 NOTE — Telephone Encounter (Signed)
I received a request from orthopedic doctors for surgical clearance, I did not ever receive previous records to review this patient's labs or old EKG. If patient desires, we can redraw labs and then she will need to come to the office for a visit to discuss surgical clearance and have EKG done.

## 2015-08-13 NOTE — Telephone Encounter (Signed)
Spoke to patient advised her of this information and transferred her up front to schedule  appointment. Rhonda Cunningham,CMA

## 2015-08-16 ENCOUNTER — Ambulatory Visit (INDEPENDENT_AMBULATORY_CARE_PROVIDER_SITE_OTHER): Payer: Medicare Other | Admitting: Osteopathic Medicine

## 2015-08-16 VITALS — BP 134/78 | HR 90 | Ht 63.0 in | Wt 201.0 lb

## 2015-08-16 DIAGNOSIS — Z7189 Other specified counseling: Secondary | ICD-10-CM | POA: Diagnosis not present

## 2015-08-16 DIAGNOSIS — I10 Essential (primary) hypertension: Secondary | ICD-10-CM

## 2015-08-16 NOTE — Progress Notes (Signed)
HPI: [name] is a 70 y.o. female who presents to [facility] today for chief complaint of:  Chief Complaint  Patient presents with  . Other    Preoperative risk assessment requested by Dr. Elsie Saas for right total knee replacement    Planned Surgery and Indication: R total knee replacement for R knee OA Surgeon/Practice:Dr. Elsie Saas, [facility] Planned Date of Surgery: 09/20/2015  Previous surgical history: see below. Previous reactions to anesthesia: no previous complications   Predictors of major cardiac complications  High-risk type of surgery - no  History of ischemic heart disease - none, EKG consistent with possible old anteroseptal infarct, no significant Q waves, no angina/claudication symptoms, no known MI or coronary revascularization  History of heart failure - no  History of cerebrovascular disease  - no  Diabetes mellitus requiring treatment with insulin - no  Preoperative serum creatinine >2.0 mg/dL (177 micromol/L) - labs pending  Cardiac functional status based on estimation of metabolic equivalents, patient activity is limited due to significant knee pain, she denies shortness of breath going upstairs or up slight incline, is able to take care of all activities of daily living: Can do fairly heavy work around the house, climb two flights of stairs (between 4 and 10 METs)     Past medical, social and family history reviewed: Past Medical History  Diagnosis Date  . Hypertension   . Hyperlipidemia   . Osteopenia    Past Surgical History  Procedure Laterality Date  . Meniscus repair    . Trigger thumb    . Carpal tunnel release    . Breast lumpectomy     Social History  Substance Use Topics  . Smoking status: Never Smoker   . Smokeless tobacco: Not on file  . Alcohol Use: Yes   Family History  Problem Relation Age of Onset  . Heart failure Mother   . Alcohol abuse Maternal Aunt   .  Heart attack Mother   . Heart attack Father     Current Outpatient Prescriptions  Medication Sig Dispense Refill  . amLODipine-benazepril (LOTREL) 5-20 MG capsule     . aspirin 81 MG tablet Take 81 mg by mouth daily.    Marland Kitchen atorvastatin (LIPITOR) 20 MG tablet Take 20 mg by mouth daily.    . clonazePAM (KLONOPIN) 0.5 MG tablet Take 1 tablet (0.5 mg total) by mouth 2 (two) times daily as needed for anxiety. 60 tablet 0  . fluticasone (FLONASE) 50 MCG/ACT nasal spray Place into both nostrils daily.    . Multiple Vitamin (MULTIVITAMIN) tablet Take 1 tablet by mouth daily.    . predniSONE (DELTASONE) 5 MG tablet Take 5 mg by mouth daily.    . raloxifene (EVISTA) 60 MG tablet Take 1 tablet (60 mg total) by mouth daily. 90 tablet 3  . [START ON 08/30/2015] temazepam (RESTORIL) 30 MG capsule Take 1 capsule (30 mg total) by mouth at bedtime as needed for sleep. 30 capsule 0  . triamterene-hydrochlorothiazide (DYAZIDE) 37.5-25 MG capsule Take 1 capsule by mouth daily.    Marland Kitchen venlafaxine XR (EFFEXOR-XR) 150 MG 24 hr capsule Take 150 mg by mouth daily with breakfast.     No current facility-administered medications for this visit.   Allergies  Allergen Reactions  . Penicillins   . Sulfa Antibiotics       Review of Systems: CONSTITUTIONAL:  No  fever, no chills, No recent illness HEAD/EYES/EARS/NOSE/THROAT: No  headache, no vision change CARDIAC:  No  chest pain, No  pressure, No palpitations, No  orthopnea RESPIRATORY: No  cough, No  shortness of breath/wheeze GASTROINTESTINAL: No  nausea, No  vomiting, No  abdominal pain MUSCULOSKELETAL: No  Myalgia/arthralgia other than knee pain SKIN: No  rash/wounds/concerning lesions HEM/ONC: No  easy bruising/bleeding, No  abnormal lymph node   Exam:  BP 134/78 mmHg  Pulse 90  Ht '5\' 3"'  (1.6 m)  Wt 201 lb (91.173 kg)  BMI 35.61 kg/m2  Constitutional: VS see above. General Appearance: alert, well-developed, well-nourished, NAD Eyes: Normal lids and  conjunctive, non-icteric sclera Ears, Nose, Mouth, Throat: MMM, Normal external inspection ears/nares/mouth/lips/gums Neck: No masses, trachea midline. No thyroid enlargement. No tenderness/mass appreciated. No lymphadenopathy Respiratory: Normal respiratory effort. no wheeze, no rhonchi, no rales Cardiovascular: S1/S2 normal, no murmur, no rub/gallop auscultated. RRR. No carotid bruit or JVD. No lower extremity edema. Musculoskeletal: Gait antalgic, using cane to ambulate. No clubbing/cyanosis of digits.  Skin: warm, dry, intact. No rash/ulcer. No concerning nevi or subq nodules on limited exam.   Psychiatric: Normal judgment/insight. Normal mood and affect. Oriented x3.    EKG interpretation: Rate: 82 Rhythm: sinus No ST/T changes concerning for acute ischemia/infarct  Inverted T waves V1, V2, no significant Q waves noted   Recent Results (from the past 2160 hour(s))  CBC with Differential/Platelet     Status: Abnormal   Collection Time: 08/16/15  1:43 PM  Result Value Ref Range   WBC 10.4 3.8 - 10.8 K/uL   RBC 4.55 3.80 - 5.10 MIL/uL   Hemoglobin 12.3 11.7 - 15.5 g/dL   HCT 37.7 35.0 - 45.0 %   MCV 82.9 80.0 - 100.0 fL   MCH 27.0 27.0 - 33.0 pg   MCHC 32.6 32.0 - 36.0 g/dL   RDW 16.1 (H) 11.0 - 15.0 %   Platelets 474 (H) 140 - 400 K/uL   MPV 9.1 7.5 - 12.5 fL   Neutro Abs 7696 1500 - 7800 cells/uL   Lymphs Abs 2080 850 - 3900 cells/uL   Monocytes Absolute 416 200 - 950 cells/uL   Eosinophils Absolute 104 15 - 500 cells/uL   Basophils Absolute 104 0 - 200 cells/uL   Neutrophils Relative % 74 %   Lymphocytes Relative 20 %   Monocytes Relative 4 %   Eosinophils Relative 1 %   Basophils Relative 1 %   Smear Review Criteria for review not met     Comment: ** Please note change in unit of measure and reference range(s). **  COMPLETE METABOLIC PANEL WITH GFR     Status: Abnormal   Collection Time: 08/16/15  1:43 PM  Result Value Ref Range   Sodium 138 135 - 146 mmol/L    Potassium 5.1 3.5 - 5.3 mmol/L   Chloride 100 98 - 110 mmol/L   CO2 25 20 - 31 mmol/L   Glucose, Bld 98 65 - 99 mg/dL   BUN 19 7 - 25 mg/dL   Creat 0.95 (H) 0.60 - 0.93 mg/dL    Comment:   For patients > or = 71 years of age: The upper reference limit for Creatinine is approximately 13% higher for people identified as African-American.      Total Bilirubin 0.4 0.2 - 1.2 mg/dL   Alkaline Phosphatase 82 33 - 130 U/L   AST 25 10 - 35 U/L   ALT 36 (H) 6 - 29 U/L   Total Protein 7.2 6.1 - 8.1 g/dL   Albumin 4.7 3.6 - 5.1 g/dL  Calcium 9.9 8.6 - 10.4 mg/dL   GFR, Est African American 70 >=60 mL/min   GFR, Est Non African American 61 >=60 mL/min      ASSESSMENT/PLAN:  Cardiac risk counseling - Patient is low risk for low risk procedure, pending labs.   Essential hypertension - Plan: CBC with Differential/Platelet, COMPLETE METABOLIC PANEL WITH GFR   Rate of cardiac death, nonfatal myocardial infarction, and nonfatal cardiac arrest according to the number of predictors: One risk factor based on mild abnormality of EKG, no prior EKGs to compare- 1.0% (95% CI: 0.5-1.4). Rate of myocardial infarction, pulmonary edema, ventricular fibrillation, primary cardiac arrest, and complete heart block: One risk factor based on mild EKG abnormality - 1.3% (95% CI: 0.7-2.1)   All questions were answered. Visit summary with medication list and pertinent instructions was printed for patient to review. ER/RTC precautions were reviewed with the patient. Return sooner if needed, for [place] when due (sometime this fall) .

## 2015-08-17 LAB — COMPLETE METABOLIC PANEL WITH GFR
ALBUMIN: 4.7 g/dL (ref 3.6–5.1)
ALK PHOS: 82 U/L (ref 33–130)
ALT: 36 U/L — ABNORMAL HIGH (ref 6–29)
AST: 25 U/L (ref 10–35)
BUN: 19 mg/dL (ref 7–25)
CALCIUM: 9.9 mg/dL (ref 8.6–10.4)
CHLORIDE: 100 mmol/L (ref 98–110)
CO2: 25 mmol/L (ref 20–31)
CREATININE: 0.95 mg/dL — AB (ref 0.60–0.93)
GFR, EST AFRICAN AMERICAN: 70 mL/min (ref >=60)
GFR, Est Non African American: 61 mL/min (ref >=60)
Glucose, Bld: 98 mg/dL (ref 65–99)
POTASSIUM: 5.1 mmol/L (ref 3.5–5.3)
Sodium: 138 mmol/L (ref 135–146)
Total Bilirubin: 0.4 mg/dL (ref 0.2–1.2)
Total Protein: 7.2 g/dL (ref 6.1–8.1)

## 2015-08-17 LAB — CBC WITH DIFFERENTIAL/PLATELET
BASOS ABS: 104 {cells}/uL (ref 0–200)
Basophils Relative: 1 %
EOS ABS: 104 {cells}/uL (ref 15–500)
Eosinophils Relative: 1 %
HEMATOCRIT: 37.7 % (ref 35.0–45.0)
Hemoglobin: 12.3 g/dL (ref 11.7–15.5)
LYMPHS PCT: 20 %
Lymphs Abs: 2080 cells/uL (ref 850–3900)
MCH: 27 pg (ref 27.0–33.0)
MCHC: 32.6 g/dL (ref 32.0–36.0)
MCV: 82.9 fL (ref 80.0–100.0)
MONO ABS: 416 {cells}/uL (ref 200–950)
MPV: 9.1 fL (ref 7.5–12.5)
Monocytes Relative: 4 %
NEUTROS PCT: 74 %
Neutro Abs: 7696 cells/uL (ref 1500–7800)
Platelets: 474 10*3/uL — ABNORMAL HIGH (ref 140–400)
RBC: 4.55 MIL/uL (ref 3.80–5.10)
RDW: 16.1 % — AB (ref 11.0–15.0)
WBC: 10.4 10*3/uL (ref 3.8–10.8)

## 2015-09-07 ENCOUNTER — Encounter (HOSPITAL_COMMUNITY): Payer: Self-pay | Admitting: Physician Assistant

## 2015-09-07 DIAGNOSIS — M1711 Unilateral primary osteoarthritis, right knee: Secondary | ICD-10-CM | POA: Diagnosis present

## 2015-09-07 DIAGNOSIS — M25561 Pain in right knee: Secondary | ICD-10-CM | POA: Diagnosis not present

## 2015-09-07 NOTE — H&P (Signed)
TOTAL KNEE ADMISSION H&P  Patient is being admitted for right total knee arthroplasty.  Subjective:  Chief Complaint:right knee pain.  HPI: [name], 70 y.o. female, has a history of pain and functional disability in the right knee due to arthritis and has failed non-surgical conservative treatments for greater than 12 weeks to includeNSAID's and/or analgesics, corticosteriod injections, viscosupplementation injections, flexibility and strengthening excercises, supervised PT with diminished ADL's post treatment, use of assistive devices and activity modification.  Onset of symptoms was gradual, starting 6 years ago with gradually worsening course since that time. The patient noted prior procedures on the knee to include  arthroscopy and menisectomy on the right knee(s).  Patient currently rates pain in the right knee(s) at 10 out of 10 with activity. Patient has night pain, worsening of pain with activity and weight bearing, pain that interferes with activities of daily living, crepitus and joint swelling.  Patient has evidence of subchondral sclerosis, periarticular osteophytes and joint space narrowing by imaging studies.  There is no active infection.  Patient Active Problem List   Diagnosis Date Noted  . Primary localized osteoarthritis of right knee   . Cardiac risk counseling 08/16/2015  . Essential hypertension 08/04/2015  . Hyperlipidemia 08/04/2015  . Osteopenia 08/04/2015  . Anxiety associated with depression 08/04/2015  . Right knee pain 06/14/2015  . Insomnia 06/14/2015   Past Medical History:  Diagnosis Date  . Hyperlipidemia   . Hypertension   . Osteopenia   . Primary localized osteoarthritis of right knee     Past Surgical History:  Procedure Laterality Date  . BREAST LUMPECTOMY Right 1997   Done in [place]  . CARPAL TUNNEL RELEASE Right 2015   Ortho in [place]  . MENISCUS REPAIR Right 2011   Orthopedic in [place]  . Trigger Thumb Right 2015   Done in  [facility] at the same time as the carpal tunnel release  . TUBAL LIGATION  1980    No current facility-administered medications for this encounter.   Current Outpatient Prescriptions:  .  amLODipine-benazepril (LOTREL) 5-20 MG capsule, , Disp: , Rfl:  .  aspirin 81 MG tablet, Take 81 mg by mouth daily., Disp: , Rfl:  .  atorvastatin (LIPITOR) 20 MG tablet, Take 20 mg by mouth daily., Disp: , Rfl:  .  clonazePAM (KLONOPIN) 0.5 MG tablet, Take 1 tablet (0.5 mg total) by mouth 2 (two) times daily as needed for anxiety., Disp: 60 tablet, Rfl: 0 .  fluticasone (FLONASE) 50 MCG/ACT nasal spray, Place into both nostrils daily., Disp: , Rfl:  .  HYDROcodone-acetaminophen (NORCO/VICODIN) 5-325 MG tablet, Take 1-2 tablets by mouth every 4 (four) hours as needed for moderate pain. , Disp: , Rfl:  .  Multiple Vitamin (MULTIVITAMIN) tablet, Take 1 tablet by mouth daily., Disp: , Rfl:  .  predniSONE (DELTASONE) 5 MG tablet, Take 5 mg by mouth daily., Disp: , Rfl:  .  raloxifene (EVISTA) 60 MG tablet, Take 1 tablet (60 mg total) by mouth daily., Disp: 90 tablet, Rfl: 3 .  temazepam (RESTORIL) 30 MG capsule, Take 1 capsule (30 mg total) by mouth at bedtime as needed for sleep., Disp: 30 capsule, Rfl: 0 .  triamterene-hydrochlorothiazide (DYAZIDE) 37.5-25 MG capsule, Take 1 capsule by mouth daily., Disp: , Rfl:  .  venlafaxine XR (EFFEXOR-XR) 150 MG 24 hr capsule, Take 150 mg by mouth daily with breakfast., Disp: , Rfl:     Allergies  Allergen Reactions  . Penicillins   . Sulfa Antibiotics  Social History  Substance Use Topics  . Smoking status: Never Smoker  . Smokeless tobacco: Never Used  . Alcohol use Yes     Comment: 1 glass a month    Family History  Problem Relation Age of Onset  . Heart failure Mother   . Heart attack Mother   . Alcohol abuse Maternal Aunt   . Heart attack Father   . Irregular heart beat Sister   . Hypertension Sister   . Heart disease Brother      Review of  Systems  Constitutional: Negative.   HENT: Negative.   Eyes: Negative.   Respiratory: Negative.   Cardiovascular: Negative.   Gastrointestinal: Negative.   Genitourinary: Negative.   Musculoskeletal: Positive for back pain and joint pain.  Skin: Negative.   Neurological: Negative.   Endo/Heme/Allergies: Negative.   Psychiatric/Behavioral: Negative.     Objective:  Physical Exam  Constitutional: She is oriented to person, place, and time. She appears well-developed and well-nourished.  HENT:  Head: Normocephalic and atraumatic.  Mouth/Throat: Oropharynx is clear and moist.  Eyes: Conjunctivae and EOM are normal. Pupils are equal, round, and reactive to light.  Neck: Neck supple.  Cardiovascular: Normal rate, regular rhythm and normal heart sounds.   Respiratory: Effort normal and breath sounds normal.  GI: Soft. Bowel sounds are normal.  Genitourinary:  Genitourinary Comments: Not pertinent to current symptomatology therefore not examined.  Musculoskeletal:  Examination of her right knee reveals pain medially and laterally.  1+ crepitation.  1+ synovitis.  Full range of motion.  Knee is stable with normal patella tracking.  Examination of her left knee reveals full range of motion without pain, swelling, weakness or instability.  Vascular exam: Pulses are 2+ and symmetric.    Neurological: She is alert and oriented to person, place, and time.  Skin: Skin is warm and dry.  Psychiatric: She has a normal mood and affect. Her behavior is normal.    Vital signs in last 24 hours: Temp:  [98.2 F (36.8 C)] 98.2 F (36.8 C) (07/25 1400) Pulse Rate:  [96] 96 (07/25 1400) BP: (166)/(75) 166/75 (07/25 1400) SpO2:  [96 %] 96 % (07/25 1400) Weight:  [93 kg (205 lb)] 93 kg (205 lb) (07/25 1400)  Labs:   Estimated body mass index is 36.31 kg/m as calculated from the following:   Height as of this encounter: 5\' 3"  (1.6 m).   Weight as of this encounter: 93 kg (205 lb).   Imaging  Review Plain radiographs demonstrate severe degenerative joint disease of the right knee(s). The overall alignment ismild varus. The bone quality appears to be good for age and reported activity level.  Assessment/Plan:  End stage arthritis, right knee  Principal Problem:   Primary localized osteoarthritis of right knee Active Problems:   Insomnia   Essential hypertension   Hyperlipidemia   Osteopenia   Anxiety associated with depression   The patient history, physical examination, clinical judgment of the provider and imaging studies are consistent with end stage degenerative joint disease of the right knee(s) and total knee arthroplasty is deemed medically necessary. The treatment options including medical management, injection therapy arthroscopy and arthroplasty were discussed at length. The risks and benefits of total knee arthroplasty were presented and reviewed. The risks due to aseptic loosening, infection, stiffness, patella tracking problems, thromboembolic complications and other imponderables were discussed. The patient acknowledged the explanation, agreed to proceed with the plan and consent was signed. Patient is being admitted for inpatient treatment for  surgery, pain control, PT, OT, prophylactic antibiotics, VTE prophylaxis, progressive ambulation and ADL's and discharge planning. The patient is planning to be discharged home with home health services

## 2015-09-10 ENCOUNTER — Encounter (HOSPITAL_COMMUNITY)
Admission: RE | Admit: 2015-09-10 | Discharge: 2015-09-10 | Disposition: A | Discharge status: Home / Self Care | Payer: Medicare Other | Source: Ambulatory Visit | Attending: Orthopedic Surgery | Admitting: Orthopedic Surgery

## 2015-09-10 ENCOUNTER — Encounter (HOSPITAL_COMMUNITY): Payer: Self-pay

## 2015-09-10 ENCOUNTER — Other Ambulatory Visit: Payer: Self-pay | Admitting: Osteopathic Medicine

## 2015-09-10 DIAGNOSIS — Z882 Allergy status to sulfonamides status: Secondary | ICD-10-CM | POA: Diagnosis not present

## 2015-09-10 DIAGNOSIS — R9431 Abnormal electrocardiogram [ECG] [EKG]: Secondary | ICD-10-CM | POA: Diagnosis not present

## 2015-09-10 DIAGNOSIS — E785 Hyperlipidemia, unspecified: Secondary | ICD-10-CM | POA: Insufficient documentation

## 2015-09-10 DIAGNOSIS — F418 Other specified anxiety disorders: Secondary | ICD-10-CM

## 2015-09-10 DIAGNOSIS — Z79899 Other long term (current) drug therapy: Secondary | ICD-10-CM | POA: Insufficient documentation

## 2015-09-10 DIAGNOSIS — I10 Essential (primary) hypertension: Secondary | ICD-10-CM | POA: Insufficient documentation

## 2015-09-10 DIAGNOSIS — Z880 Allergy status to penicillin: Secondary | ICD-10-CM | POA: Diagnosis not present

## 2015-09-10 DIAGNOSIS — Z01818 Encounter for other preprocedural examination: Secondary | ICD-10-CM | POA: Diagnosis not present

## 2015-09-10 DIAGNOSIS — M1711 Unilateral primary osteoarthritis, right knee: Secondary | ICD-10-CM | POA: Insufficient documentation

## 2015-09-10 DIAGNOSIS — Z7982 Long term (current) use of aspirin: Secondary | ICD-10-CM | POA: Insufficient documentation

## 2015-09-10 DIAGNOSIS — Z01812 Encounter for preprocedural laboratory examination: Secondary | ICD-10-CM | POA: Insufficient documentation

## 2015-09-10 DIAGNOSIS — M8580 Other specified disorders of bone density and structure, unspecified site: Secondary | ICD-10-CM | POA: Diagnosis not present

## 2015-09-10 DIAGNOSIS — Z0183 Encounter for blood typing: Secondary | ICD-10-CM | POA: Insufficient documentation

## 2015-09-10 DIAGNOSIS — G4700 Insomnia, unspecified: Secondary | ICD-10-CM | POA: Diagnosis not present

## 2015-09-10 HISTORY — DX: Major depressive disorder, single episode, unspecified: F32.9

## 2015-09-10 HISTORY — DX: Depression, unspecified: F32.A

## 2015-09-10 HISTORY — DX: Headache, unspecified: R51.9

## 2015-09-10 HISTORY — DX: Headache: R51

## 2015-09-10 HISTORY — DX: Anxiety disorder, unspecified: F41.9

## 2015-09-10 LAB — COMPREHENSIVE METABOLIC PANEL
ALBUMIN: 4 g/dL (ref 3.5–5.0)
ALK PHOS: 82 U/L (ref 38–126)
ALT: 25 U/L (ref 14–54)
AST: 25 U/L (ref 15–41)
Anion gap: 12 (ref 5–15)
BILIRUBIN TOTAL: 0.4 mg/dL (ref 0.3–1.2)
BUN: 20 mg/dL (ref 6–20)
CALCIUM: 9.3 mg/dL (ref 8.9–10.3)
CO2: 23 mmol/L (ref 22–32)
CREATININE: 0.93 mg/dL (ref 0.44–1.00)
Chloride: 105 mmol/L (ref 101–111)
GFR calc Af Amer: >60 mL/min (ref >60)
GFR calc non Af Amer: >60 mL/min (ref >60)
GLUCOSE: 111 mg/dL — AB (ref 65–99)
Potassium: 4.2 mmol/L (ref 3.5–5.1)
SODIUM: 140 mmol/L (ref 135–145)
Total Protein: 6.8 g/dL (ref 6.5–8.1)

## 2015-09-10 LAB — CBC WITH DIFFERENTIAL/PLATELET
BASOS PCT: 0 %
Basophils Absolute: 0 10*3/uL (ref 0.0–0.1)
EOS ABS: 0 10*3/uL (ref 0.0–0.7)
Eosinophils Relative: 0 %
HEMATOCRIT: 36.7 % (ref 36.0–46.0)
HEMOGLOBIN: 11.6 g/dL — AB (ref 12.0–15.0)
LYMPHS ABS: 2.3 10*3/uL (ref 0.7–4.0)
Lymphocytes Relative: 18 %
MCH: 26.8 pg (ref 26.0–34.0)
MCHC: 31.6 g/dL (ref 30.0–36.0)
MCV: 84.8 fL (ref 78.0–100.0)
Monocytes Absolute: 0.5 10*3/uL (ref 0.1–1.0)
Monocytes Relative: 4 %
NEUTROS ABS: 9.7 10*3/uL — AB (ref 1.7–7.7)
NEUTROS PCT: 78 %
Platelets: 426 10*3/uL — ABNORMAL HIGH (ref 150–400)
RBC: 4.33 MIL/uL (ref 3.87–5.11)
RDW: 16.2 % — ABNORMAL HIGH (ref 11.5–15.5)
WBC: 12.6 10*3/uL — AB (ref 4.0–10.5)

## 2015-09-10 LAB — SURGICAL PCR SCREEN
MRSA, PCR: NEGATIVE
STAPHYLOCOCCUS AUREUS: POSITIVE — AB

## 2015-09-10 LAB — PROTIME-INR
INR: 1.03
Prothrombin Time: 13.6 seconds (ref 11.4–15.2)

## 2015-09-10 LAB — TYPE AND SCREEN
ABO/RH(D): A POS
Antibody Screen: NEGATIVE

## 2015-09-10 LAB — ABO/RH: ABO/RH(D): A POS

## 2015-09-10 LAB — MRSA PCR SCREENING: MRSA BY PCR: NEGATIVE

## 2015-09-10 LAB — APTT: aPTT: 25 seconds (ref 24–36)

## 2015-09-10 MED ORDER — CHLORHEXIDINE GLUCONATE CLOTH 2 % EX PADS: 6.0000 | MEDICATED_PAD | Freq: Every day | CUTANEOUS | Status: DC

## 2015-09-10 MED ORDER — MUPIROCIN 2 % EX OINT: 1.0000 "application " | TOPICAL_OINTMENT | Freq: Two times a day (BID) | CUTANEOUS | Status: DC

## 2015-09-10 MED ORDER — CLONAZEPAM 0.5 MG PO TABS: 0.5000 mg | ORAL_TABLET | Freq: Two times a day (BID) | ORAL | 0 refills | Status: DC | PRN

## 2015-09-10 NOTE — Progress Notes (Signed)
   09/10/15 1218  OBSTRUCTIVE SLEEP APNEA  Have you ever been diagnosed with sleep apnea through a sleep study? No  Do you snore loudly (loud enough to be heard through closed doors)?  1  Do you often feel tired, fatigued, or sleepy during the daytime (such as falling asleep during driving or talking to someone)? 0  Has anyone observed you stop breathing during your sleep? 0  Do you have, or are you being treated for high blood pressure? 1  BMI more than 35 kg/m2? 1  Age > 50 (1-yes) 1  Neck circumference greater than:Female 16 inches or larger, Female 17inches or larger? 1  Female Gender (Yes=1) 0  Obstructive Sleep Apnea Score 5  Score 5 or greater  Results sent to PCP

## 2015-09-10 NOTE — Pre-Procedure Instructions (Signed)
[name]  09/10/2015      [facility] [address] [place] [address] Phone: [phone] Fax: [phone]    Your procedure is scheduled on Monday 09/20/15  Report to [facility] at 1020 A.M.  Call this number if you have problems the morning of surgery:  [phone]   Remember:  Do not eat food or drink liquids after midnight.  Take these medicines the morning of surgery with A SIP OF WATER   CLONAZEPAM (KLONOPIN), HYDROCODONE, PREDNISONE, VENLAFAXINE (EFFEXOR)    (STOP 7 DAYS PRIOR TO SURGERY ASPIRIN, MULTIVITAMIN, IBUPROFEN/ ADVIL/ MOTRIN, ALEVE, GOODY POWDERS/ BCS, HERBAL MEDICINES)     Do not wear jewelry, make-up or nail polish.  Do not wear lotions, powders, or perfumes.  You may wear deoderant.  Do not shave 48 hours prior to surgery.  Men may shave face and neck.  Do not bring valuables to the hospital.  [facility] is not responsible for any belongings or valuables.  Contacts, dentures or bridgework may not be worn into surgery.  Leave your suitcase in the car.  After surgery it may be brought to your room.  For patients admitted to the hospital, discharge time will be determined by your treatment team.  Patients discharged the day of surgery will not be allowed to drive home.   Name and phone number of your driver:  Special instructions:  [facility] - Preparing for Surgery  Before surgery, you can play an important role.  Because skin is not sterile, your skin needs to be as free of germs as possible.  You can reduce the number of germs on you skin by washing with CHG (chlorahexidine gluconate) soap before surgery.  CHG is an antiseptic cleaner which kills germs and bonds with the skin to continue killing germs even after washing.  Please DO NOT use if you have an allergy to CHG or antibacterial soaps.  If your skin becomes reddened/irritated stop using the CHG and inform  your nurse when you arrive at Short Stay.  Do not shave (including legs and underarms) for at least 48 hours prior to the first CHG shower.  You may shave your face.  Please follow these instructions carefully:   1.  Shower with CHG Soap the night before surgery and the                                morning of Surgery.  2.  If you choose to wash your hair, wash your hair first as usual with your       normal shampoo.  3.  After you shampoo, rinse your hair and body thoroughly to remove the                      Shampoo.  4.  Use CHG as you would any other liquid soap.  You can apply chg directly       to the skin and wash gently with scrungie or a clean washcloth.  5.  Apply the CHG Soap to your body ONLY FROM THE NECK DOWN.        Do not use on open wounds or open sores.  Avoid contact with your eyes,       ears, mouth and genitals (private parts).  Wash genitals (private parts)  with your normal soap.  6.  Wash thoroughly, paying special attention to the area where your surgery        will be performed.  7.  Thoroughly rinse your body with warm water from the neck down.  8.  DO NOT shower/wash with your normal soap after using and rinsing off       the CHG Soap.  9.  Pat yourself dry with a clean towel.            10.  Wear clean pajamas.            11.  Place clean sheets on your bed the night of your first shower and do not        sleep with pets.  Day of Surgery  Do not apply any lotions/deoderants the morning of surgery.  Please wear clean clothes to the hospital/surgery center.    Please read over the following fact sheets that you were given. Pain Booklet, Coughing and Deep Breathing, Blood Transfusion Information, MRSA Information and Surgical Site Infection Prevention

## 2015-09-11 LAB — URINE CULTURE

## 2015-09-13 ENCOUNTER — Encounter: Payer: Self-pay | Admitting: Osteopathic Medicine

## 2015-09-15 NOTE — Addendum Note (Signed)
Addended by: Huel Cote on: 09/15/2015 08:50 AM   Modules accepted: Orders

## 2015-09-19 MED ORDER — CEFAZOLIN SODIUM-DEXTROSE 2-4 GM/100ML-% IV SOLN
2.0000 g | INTRAVENOUS | Status: DC
  Filled 2015-09-19: qty 100

## 2015-09-19 NOTE — Anesthesia Preprocedure Evaluation (Addendum)
Anesthesia Evaluation  Patient identified by MRN, date of birth, ID band Patient awake    Reviewed: Allergy & Precautions, NPO status , Patient's Chart, lab work & pertinent test results  History of Anesthesia Complications Negative for: history of anesthetic complications  Airway Mallampati: I  TM Distance: >3 FB     Dental   Pulmonary neg pulmonary ROS,    Pulmonary exam normal        Cardiovascular hypertension, Pt. on medications Normal cardiovascular exam     Neuro/Psych  Headaches, PSYCHIATRIC DISORDERS Anxiety Depression    GI/Hepatic   Endo/Other  Morbid obesity  Renal/GU      Musculoskeletal  (+) Arthritis , Osteoarthritis,  Chronic prednisone use, 5mg /day   Abdominal (+) + obese,   Peds  Hematology   Anesthesia Other Findings   Reproductive/Obstetrics negative OB ROS                           BP Readings from Last 3 Encounters:  09/10/15 (!) 164/74  08/16/15 134/78  08/04/15 139/72   Lab Results  Component Value Date   WBC 12.6 (H) 09/10/2015   HGB 11.6 (L) 09/10/2015   HCT 36.7 09/10/2015   MCV 84.8 09/10/2015   PLT 426 (H) 09/10/2015     Chemistry      Component Value Date/Time   NA 140 09/10/2015 1240   K 4.2 09/10/2015 1240   CL 105 09/10/2015 1240   CO2 23 09/10/2015 1240   BUN 20 09/10/2015 1240   CREATININE 0.93 09/10/2015 1240   CREATININE 0.95 (H) 08/16/2015 1343      Component Value Date/Time   CALCIUM 9.3 09/10/2015 1240   ALKPHOS 82 09/10/2015 1240   AST 25 09/10/2015 1240   ALT 25 09/10/2015 1240   BILITOT 0.4 09/10/2015 1240     Lab Results  Component Value Date   INR 1.03 09/10/2015   EKG 09/10/15: Normal sinus rhythm Low voltage QRS Septal infarct , age undetermined Left atrial enlargement    Anesthesia Physical Anesthesia Plan  ASA: II  Anesthesia Plan: General   Post-op Pain Management:    Induction: Intravenous  Airway  Management Planned: Oral ETT and LMA  Additional Equipment:   Intra-op Plan:   Post-operative Plan: Extubation in OR  Informed Consent: I have reviewed the patients History and Physical, chart, labs and discussed the procedure including the risks, benefits and alternatives for the proposed anesthesia with the patient or authorized representative who has indicated his/her understanding and acceptance.     Plan Discussed with: CRNA, Anesthesiologist and Surgeon  Anesthesia Plan Comments:         Anesthesia Quick Evaluation

## 2015-09-20 ENCOUNTER — Encounter (HOSPITAL_COMMUNITY)
Admission: RE | Disposition: A | Discharge status: Home Health | Payer: Self-pay | Source: Ambulatory Visit | Attending: Orthopedic Surgery

## 2015-09-20 ENCOUNTER — Inpatient Hospital Stay (HOSPITAL_COMMUNITY): Payer: Medicare Other | Admitting: Certified Registered Nurse Anesthetist

## 2015-09-20 ENCOUNTER — Encounter (HOSPITAL_COMMUNITY): Payer: Self-pay | Admitting: Anesthesiology

## 2015-09-20 ENCOUNTER — Inpatient Hospital Stay (HOSPITAL_COMMUNITY)
Admission: RE | Admit: 2015-09-20 | Discharge: 2015-09-22 | DRG: 470 | Disposition: A | Discharge status: Home Health | Payer: Medicare Other | Source: Ambulatory Visit | Attending: Orthopedic Surgery | Admitting: Orthopedic Surgery

## 2015-09-20 DIAGNOSIS — Z8249 Family history of ischemic heart disease and other diseases of the circulatory system: Secondary | ICD-10-CM

## 2015-09-20 DIAGNOSIS — M25561 Pain in right knee: Secondary | ICD-10-CM | POA: Diagnosis not present

## 2015-09-20 DIAGNOSIS — Z7982 Long term (current) use of aspirin: Secondary | ICD-10-CM | POA: Diagnosis not present

## 2015-09-20 DIAGNOSIS — E785 Hyperlipidemia, unspecified: Secondary | ICD-10-CM | POA: Diagnosis present

## 2015-09-20 DIAGNOSIS — G8918 Other acute postprocedural pain: Secondary | ICD-10-CM | POA: Diagnosis not present

## 2015-09-20 DIAGNOSIS — I10 Essential (primary) hypertension: Secondary | ICD-10-CM | POA: Diagnosis not present

## 2015-09-20 DIAGNOSIS — F418 Other specified anxiety disorders: Secondary | ICD-10-CM | POA: Diagnosis present

## 2015-09-20 DIAGNOSIS — Z881 Allergy status to other antibiotic agents status: Secondary | ICD-10-CM

## 2015-09-20 DIAGNOSIS — Z79899 Other long term (current) drug therapy: Secondary | ICD-10-CM

## 2015-09-20 DIAGNOSIS — M1711 Unilateral primary osteoarthritis, right knee: Secondary | ICD-10-CM | POA: Diagnosis not present

## 2015-09-20 DIAGNOSIS — Z7952 Long term (current) use of systemic steroids: Secondary | ICD-10-CM | POA: Diagnosis not present

## 2015-09-20 DIAGNOSIS — Z6836 Body mass index (BMI) 36.0-36.9, adult: Secondary | ICD-10-CM | POA: Diagnosis not present

## 2015-09-20 DIAGNOSIS — Z880 Allergy status to penicillin: Secondary | ICD-10-CM | POA: Diagnosis not present

## 2015-09-20 DIAGNOSIS — M179 Osteoarthritis of knee, unspecified: Secondary | ICD-10-CM | POA: Diagnosis not present

## 2015-09-20 DIAGNOSIS — F5105 Insomnia due to other mental disorder: Secondary | ICD-10-CM

## 2015-09-20 DIAGNOSIS — M8580 Other specified disorders of bone density and structure, unspecified site: Secondary | ICD-10-CM | POA: Diagnosis not present

## 2015-09-20 DIAGNOSIS — F419 Anxiety disorder, unspecified: Secondary | ICD-10-CM | POA: Diagnosis present

## 2015-09-20 DIAGNOSIS — G4700 Insomnia, unspecified: Secondary | ICD-10-CM | POA: Diagnosis present

## 2015-09-20 HISTORY — DX: Unilateral primary osteoarthritis, right knee: M17.11

## 2015-09-20 HISTORY — PX: TOTAL KNEE ARTHROPLASTY: SHX125

## 2015-09-20 LAB — CBC
HEMATOCRIT: 36.5 % (ref 36.0–46.0)
HEMOGLOBIN: 11.5 g/dL — AB (ref 12.0–15.0)
MCH: 26.9 pg (ref 26.0–34.0)
MCHC: 31.5 g/dL (ref 30.0–36.0)
MCV: 85.3 fL (ref 78.0–100.0)
Platelets: 378 10*3/uL (ref 150–400)
RBC: 4.28 MIL/uL (ref 3.87–5.11)
RDW: 16.1 % — ABNORMAL HIGH (ref 11.5–15.5)
WBC: 11.7 10*3/uL — AB (ref 4.0–10.5)

## 2015-09-20 LAB — CREATININE, SERUM
Creatinine, Ser: 0.89 mg/dL (ref 0.44–1.00)
GFR calc non Af Amer: >60 mL/min (ref >60)

## 2015-09-20 SURGERY — ARTHROPLASTY, KNEE, TOTAL
Anesthesia: Monitor Anesthesia Care | Site: Knee | Laterality: Right

## 2015-09-20 MED ORDER — DEXAMETHASONE SODIUM PHOSPHATE 10 MG/ML IJ SOLN
INTRAMUSCULAR | Status: AC
  Filled 2015-09-20: qty 1

## 2015-09-20 MED ORDER — BENAZEPRIL HCL 20 MG PO TABS
20.0000 mg | ORAL_TABLET | Freq: Every day | ORAL | Status: DC
  Administered 2015-09-21 – 2015-09-22 (×2): 20 mg via ORAL
  Filled 2015-09-20 (×2): qty 1

## 2015-09-20 MED ORDER — FENTANYL CITRATE (PF) 250 MCG/5ML IJ SOLN
INTRAMUSCULAR | Status: AC
  Filled 2015-09-20: qty 5

## 2015-09-20 MED ORDER — ALUM & MAG HYDROXIDE-SIMETH 200-200-20 MG/5ML PO SUSP: 30.0000 mL | ORAL | Status: DC | PRN

## 2015-09-20 MED ORDER — SUCCINYLCHOLINE CHLORIDE 200 MG/10ML IV SOSY
PREFILLED_SYRINGE | INTRAVENOUS | Status: AC
  Filled 2015-09-20: qty 10

## 2015-09-20 MED ORDER — EPHEDRINE 5 MG/ML INJ
INTRAVENOUS | Status: AC
  Filled 2015-09-20: qty 10

## 2015-09-20 MED ORDER — AMLODIPINE BESYLATE 5 MG PO TABS
5.0000 mg | ORAL_TABLET | Freq: Every day | ORAL | Status: DC
  Administered 2015-09-21 – 2015-09-22 (×2): 5 mg via ORAL
  Filled 2015-09-20 (×2): qty 1

## 2015-09-20 MED ORDER — POTASSIUM CHLORIDE IN NACL 20-0.9 MEQ/L-% IV SOLN
INTRAVENOUS | Status: DC
  Administered 2015-09-20: 14:00:00 via INTRAVENOUS
  Filled 2015-09-20 (×2): qty 1000

## 2015-09-20 MED ORDER — CLONAZEPAM 0.5 MG PO TABS
0.5000 mg | ORAL_TABLET | Freq: Three times a day (TID) | ORAL | Status: DC | PRN
  Administered 2015-09-20 – 2015-09-22 (×2): 0.5 mg via ORAL
  Filled 2015-09-20 (×2): qty 1

## 2015-09-20 MED ORDER — BUPIVACAINE-EPINEPHRINE (PF) 0.25% -1:200000 IJ SOLN
INTRAMUSCULAR | Status: AC
  Filled 2015-09-20: qty 30

## 2015-09-20 MED ORDER — BUPIVACAINE-EPINEPHRINE 0.25% -1:200000 IJ SOLN
INTRAMUSCULAR | Status: DC | PRN
  Administered 2015-09-20: 30 mL

## 2015-09-20 MED ORDER — FLUTICASONE PROPIONATE 50 MCG/ACT NA SUSP
2.0000 | Freq: Every day | NASAL | Status: DC
  Administered 2015-09-21 – 2015-09-22 (×2): 2 via NASAL
  Filled 2015-09-20: qty 16

## 2015-09-20 MED ORDER — METOCLOPRAMIDE HCL 5 MG/ML IJ SOLN: 5.0000 mg | Freq: Three times a day (TID) | INTRAMUSCULAR | Status: DC | PRN

## 2015-09-20 MED ORDER — GLYCOPYRROLATE 0.2 MG/ML IV SOSY
PREFILLED_SYRINGE | INTRAVENOUS | Status: AC
  Filled 2015-09-20: qty 3

## 2015-09-20 MED ORDER — POLYETHYLENE GLYCOL 3350 17 G PO PACK
17.0000 g | PACK | Freq: Two times a day (BID) | ORAL | Status: DC
  Administered 2015-09-20 – 2015-09-22 (×5): 17 g via ORAL
  Filled 2015-09-20 (×5): qty 1

## 2015-09-20 MED ORDER — PHENYLEPHRINE HCL 10 MG/ML IJ SOLN
INTRAMUSCULAR | Status: DC | PRN
  Administered 2015-09-20: 20 ug/min via INTRAVENOUS

## 2015-09-20 MED ORDER — PHENOL 1.4 % MT LIQD: 1.0000 | OROMUCOSAL | Status: DC | PRN

## 2015-09-20 MED ORDER — LACTATED RINGERS IV SOLN
INTRAVENOUS | Status: DC
  Administered 2015-09-20 (×2): via INTRAVENOUS

## 2015-09-20 MED ORDER — TEMAZEPAM 15 MG PO CAPS
30.0000 mg | ORAL_CAPSULE | Freq: Every evening | ORAL | Status: DC | PRN
  Administered 2015-09-21: 30 mg via ORAL
  Filled 2015-09-20: qty 2

## 2015-09-20 MED ORDER — PROPOFOL 10 MG/ML IV BOLUS
INTRAVENOUS | Status: DC | PRN
  Administered 2015-09-20 (×2): 20 mg via INTRAVENOUS

## 2015-09-20 MED ORDER — ONDANSETRON HCL 4 MG/2ML IJ SOLN
INTRAMUSCULAR | Status: AC
  Filled 2015-09-20: qty 2

## 2015-09-20 MED ORDER — CEFAZOLIN SODIUM 1 G IJ SOLR
INTRAMUSCULAR | Status: AC
  Filled 2015-09-20: qty 20

## 2015-09-20 MED ORDER — OXYCODONE HCL 5 MG PO TABS
ORAL_TABLET | ORAL | Status: AC
  Filled 2015-09-20: qty 1

## 2015-09-20 MED ORDER — MIDAZOLAM HCL 5 MG/5ML IJ SOLN
INTRAMUSCULAR | Status: DC | PRN
Start: 2015-09-20 — End: 2015-09-20
  Administered 2015-09-20 (×2): 1 mg via INTRAVENOUS

## 2015-09-20 MED ORDER — OXYCODONE HCL 5 MG PO TABS
5.0000 mg | ORAL_TABLET | ORAL | Status: DC | PRN
  Administered 2015-09-20: 10 mg via ORAL
  Administered 2015-09-20: 5 mg via ORAL
  Administered 2015-09-20 – 2015-09-22 (×9): 10 mg via ORAL
  Filled 2015-09-20 (×10): qty 2

## 2015-09-20 MED ORDER — ONDANSETRON HCL 4 MG PO TABS: 4.0000 mg | ORAL_TABLET | Freq: Four times a day (QID) | ORAL | Status: DC | PRN

## 2015-09-20 MED ORDER — ACETAMINOPHEN 650 MG RE SUPP: 650.0000 mg | Freq: Four times a day (QID) | RECTAL | Status: DC | PRN

## 2015-09-20 MED ORDER — 0.9 % SODIUM CHLORIDE (POUR BTL) OPTIME
TOPICAL | Status: DC | PRN
  Administered 2015-09-20: 1000 mL

## 2015-09-20 MED ORDER — VANCOMYCIN HCL IN DEXTROSE 1-5 GM/200ML-% IV SOLN
1000.0000 mg | INTRAVENOUS | Status: AC
  Administered 2015-09-20: 1000 mg via INTRAVENOUS
  Filled 2015-09-20: qty 200

## 2015-09-20 MED ORDER — ONDANSETRON HCL 4 MG/2ML IJ SOLN: 4.0000 mg | Freq: Once | INTRAMUSCULAR | Status: DC | PRN

## 2015-09-20 MED ORDER — AMLODIPINE BESY-BENAZEPRIL HCL 5-20 MG PO CAPS: 1.0000 | ORAL_CAPSULE | Freq: Every day | ORAL | Status: DC

## 2015-09-20 MED ORDER — DOCUSATE SODIUM 100 MG PO CAPS
100.0000 mg | ORAL_CAPSULE | Freq: Two times a day (BID) | ORAL | Status: DC
  Administered 2015-09-20 – 2015-09-22 (×5): 100 mg via ORAL
  Filled 2015-09-20 (×5): qty 1

## 2015-09-20 MED ORDER — ATORVASTATIN CALCIUM 20 MG PO TABS
20.0000 mg | ORAL_TABLET | Freq: Every day | ORAL | Status: DC
  Administered 2015-09-21 – 2015-09-22 (×2): 20 mg via ORAL
  Filled 2015-09-20 (×2): qty 1

## 2015-09-20 MED ORDER — DIPHENHYDRAMINE HCL 12.5 MG/5ML PO ELIX: 12.5000 mg | ORAL_SOLUTION | ORAL | Status: DC | PRN

## 2015-09-20 MED ORDER — DEXAMETHASONE SODIUM PHOSPHATE 10 MG/ML IJ SOLN
INTRAMUSCULAR | Status: DC | PRN
  Administered 2015-09-20: 10 mg via INTRAVENOUS

## 2015-09-20 MED ORDER — ROCURONIUM BROMIDE 10 MG/ML (PF) SYRINGE
PREFILLED_SYRINGE | INTRAVENOUS | Status: AC
  Filled 2015-09-20: qty 10

## 2015-09-20 MED ORDER — SODIUM CHLORIDE 0.9 % IR SOLN
Status: DC | PRN
  Administered 2015-09-20: 3000 mL

## 2015-09-20 MED ORDER — POVIDONE-IODINE 7.5 % EX SOLN
Freq: Once | CUTANEOUS | Status: DC
  Filled 2015-09-20: qty 118

## 2015-09-20 MED ORDER — LIDOCAINE 2% (20 MG/ML) 5 ML SYRINGE
INTRAMUSCULAR | Status: AC
  Filled 2015-09-20: qty 5

## 2015-09-20 MED ORDER — ENOXAPARIN SODIUM 30 MG/0.3ML ~~LOC~~ SOLN
30.0000 mg | Freq: Two times a day (BID) | SUBCUTANEOUS | Status: DC
  Administered 2015-09-21 – 2015-09-22 (×3): 30 mg via SUBCUTANEOUS
  Filled 2015-09-20 (×3): qty 0.3

## 2015-09-20 MED ORDER — METOCLOPRAMIDE HCL 5 MG PO TABS: 5.0000 mg | ORAL_TABLET | Freq: Three times a day (TID) | ORAL | Status: DC | PRN

## 2015-09-20 MED ORDER — PHENYLEPHRINE 40 MCG/ML (10ML) SYRINGE FOR IV PUSH (FOR BLOOD PRESSURE SUPPORT)
PREFILLED_SYRINGE | INTRAVENOUS | Status: AC
  Filled 2015-09-20: qty 10

## 2015-09-20 MED ORDER — ACETAMINOPHEN 325 MG PO TABS
650.0000 mg | ORAL_TABLET | Freq: Four times a day (QID) | ORAL | Status: DC | PRN
  Administered 2015-09-22: 650 mg via ORAL
  Filled 2015-09-20: qty 2

## 2015-09-20 MED ORDER — PROPOFOL 500 MG/50ML IV EMUL
INTRAVENOUS | Status: DC | PRN
  Administered 2015-09-20: 09:00:00 via INTRAVENOUS
  Administered 2015-09-20: 50 ug/kg/min via INTRAVENOUS

## 2015-09-20 MED ORDER — MENTHOL 3 MG MT LOZG: 1.0000 | LOZENGE | OROMUCOSAL | Status: DC | PRN

## 2015-09-20 MED ORDER — PREDNISONE 5 MG PO TABS
5.0000 mg | ORAL_TABLET | Freq: Every day | ORAL | Status: DC
  Administered 2015-09-21 – 2015-09-22 (×2): 5 mg via ORAL
  Filled 2015-09-20 (×2): qty 1

## 2015-09-20 MED ORDER — MIDAZOLAM HCL 2 MG/2ML IJ SOLN
INTRAMUSCULAR | Status: AC
  Filled 2015-09-20: qty 2

## 2015-09-20 MED ORDER — CHLORHEXIDINE GLUCONATE 4 % EX LIQD: 60.0000 mL | Freq: Once | CUTANEOUS | Status: DC

## 2015-09-20 MED ORDER — ONDANSETRON HCL 4 MG/2ML IJ SOLN
INTRAMUSCULAR | Status: DC | PRN
  Administered 2015-09-20: 4 mg via INTRAVENOUS

## 2015-09-20 MED ORDER — ONDANSETRON HCL 4 MG/2ML IJ SOLN: 4.0000 mg | Freq: Four times a day (QID) | INTRAMUSCULAR | Status: DC | PRN

## 2015-09-20 MED ORDER — VENLAFAXINE HCL ER 150 MG PO CP24
150.0000 mg | ORAL_CAPSULE | Freq: Every day | ORAL | Status: DC
  Administered 2015-09-21 – 2015-09-22 (×2): 150 mg via ORAL
  Filled 2015-09-20 (×2): qty 1

## 2015-09-20 MED ORDER — HYDROMORPHONE HCL 1 MG/ML IJ SOLN
1.0000 mg | INTRAMUSCULAR | Status: DC | PRN
  Administered 2015-09-20 – 2015-09-21 (×4): 1 mg via INTRAVENOUS
  Filled 2015-09-20 (×4): qty 1

## 2015-09-20 MED ORDER — HYDROMORPHONE HCL 1 MG/ML IJ SOLN: 0.5000 mg | INTRAMUSCULAR | Status: DC | PRN

## 2015-09-20 MED ORDER — DEXAMETHASONE SODIUM PHOSPHATE 10 MG/ML IJ SOLN
10.0000 mg | Freq: Three times a day (TID) | INTRAMUSCULAR | Status: AC
  Administered 2015-09-20 – 2015-09-21 (×4): 10 mg via INTRAVENOUS
  Filled 2015-09-20 (×4): qty 1

## 2015-09-20 MED ORDER — VANCOMYCIN HCL IN DEXTROSE 1-5 GM/200ML-% IV SOLN
1000.0000 mg | Freq: Two times a day (BID) | INTRAVENOUS | Status: AC
  Administered 2015-09-20: 1000 mg via INTRAVENOUS
  Filled 2015-09-20: qty 200

## 2015-09-20 SURGICAL SUPPLY — 63 items
BANDAGE ESMARK 6X9 LF (GAUZE/BANDAGES/DRESSINGS) ×1 IMPLANT
BENZOIN TINCTURE PRP APPL 2/3 (GAUZE/BANDAGES/DRESSINGS) ×2 IMPLANT
BLADE SAGITTAL 25.0X1.19X90 (BLADE) ×2 IMPLANT
BLADE SAW SGTL 13X75X1.27 (BLADE) ×2 IMPLANT
BLADE SURG 10 STRL SS (BLADE) ×4 IMPLANT
BNDG ELASTIC 6X15 VLCR STRL LF (GAUZE/BANDAGES/DRESSINGS) ×2 IMPLANT
BNDG ESMARK 6X9 LF (GAUZE/BANDAGES/DRESSINGS) ×2
BOWL SMART MIX CTS (DISPOSABLE) ×2 IMPLANT
CAPT KNEE TOTAL 3 ATTUNE ×2 IMPLANT
CEMENT HV SMART SET (Cement) ×4 IMPLANT
COVER SURGICAL LIGHT HANDLE (MISCELLANEOUS) ×2 IMPLANT
CUFF TOURNIQUET SINGLE 34IN LL (TOURNIQUET CUFF) ×2 IMPLANT
CUFF TOURNIQUET SINGLE 44IN (TOURNIQUET CUFF) IMPLANT
DECANTER SPIKE VIAL GLASS SM (MISCELLANEOUS) IMPLANT
DRAPE EXTREMITY T 121X128X90 (DRAPE) ×2 IMPLANT
DRAPE INCISE IOBAN 66X45 STRL (DRAPES) ×2 IMPLANT
DRAPE PROXIMA HALF (DRAPES) ×2 IMPLANT
DRAPE U-SHAPE 47X51 STRL (DRAPES) ×2 IMPLANT
DRSG AQUACEL AG ADV 3.5X14 (GAUZE/BANDAGES/DRESSINGS) ×2 IMPLANT
DURAPREP 26ML APPLICATOR (WOUND CARE) ×2 IMPLANT
ELECT CAUTERY BLADE 6.4 (BLADE) ×2 IMPLANT
ELECT REM PT RETURN 9FT ADLT (ELECTROSURGICAL) ×2
ELECTRODE REM PT RTRN 9FT ADLT (ELECTROSURGICAL) ×1 IMPLANT
FACESHIELD WRAPAROUND (MASK) ×2 IMPLANT
GLOVE BIO SURGEON STRL SZ7 (GLOVE) ×2 IMPLANT
GLOVE BIOGEL PI IND STRL 7.0 (GLOVE) ×1 IMPLANT
GLOVE BIOGEL PI IND STRL 7.5 (GLOVE) ×1 IMPLANT
GLOVE BIOGEL PI INDICATOR 7.0 (GLOVE) ×1
GLOVE BIOGEL PI INDICATOR 7.5 (GLOVE) ×1
GLOVE SS BIOGEL STRL SZ 7.5 (GLOVE) ×1 IMPLANT
GLOVE SUPERSENSE BIOGEL SZ 7.5 (GLOVE) ×1
GOWN STRL REUS W/ TWL LRG LVL3 (GOWN DISPOSABLE) ×1 IMPLANT
GOWN STRL REUS W/ TWL XL LVL3 (GOWN DISPOSABLE) ×2 IMPLANT
GOWN STRL REUS W/TWL LRG LVL3 (GOWN DISPOSABLE) ×1
GOWN STRL REUS W/TWL XL LVL3 (GOWN DISPOSABLE) ×2
HANDPIECE INTERPULSE COAX TIP (DISPOSABLE) ×1
HOOD PEEL AWAY FACE SHEILD DIS (HOOD) ×4 IMPLANT
IMMOBILIZER KNEE 22 UNIV (SOFTGOODS) ×2 IMPLANT
KIT BASIN OR (CUSTOM PROCEDURE TRAY) ×2 IMPLANT
KIT ROOM TURNOVER OR (KITS) ×2 IMPLANT
MANIFOLD NEPTUNE II (INSTRUMENTS) ×2 IMPLANT
MARKER SKIN DUAL TIP RULER LAB (MISCELLANEOUS) ×2 IMPLANT
NEEDLE 18GX1X1/2 (RX/OR ONLY) (NEEDLE) ×2 IMPLANT
NS IRRIG 1000ML POUR BTL (IV SOLUTION) ×2 IMPLANT
PACK TOTAL JOINT (CUSTOM PROCEDURE TRAY) ×2 IMPLANT
PAD ARMBOARD 7.5X6 YLW CONV (MISCELLANEOUS) ×4 IMPLANT
SET HNDPC FAN SPRY TIP SCT (DISPOSABLE) ×1 IMPLANT
STRIP CLOSURE SKIN 1/2X4 (GAUZE/BANDAGES/DRESSINGS) ×2 IMPLANT
SUCTION FRAZIER HANDLE 10FR (MISCELLANEOUS) ×1
SUCTION TUBE FRAZIER 10FR DISP (MISCELLANEOUS) ×1 IMPLANT
SUT MNCRL AB 3-0 PS2 18 (SUTURE) ×2 IMPLANT
SUT VIC AB 0 CT1 27 (SUTURE) ×3
SUT VIC AB 0 CT1 27XBRD ANBCTR (SUTURE) ×3 IMPLANT
SUT VIC AB 1 CT1 27 (SUTURE) ×1
SUT VIC AB 1 CT1 27XBRD ANBCTR (SUTURE) ×1 IMPLANT
SUT VIC AB 2-0 CT1 27 (SUTURE) ×3
SUT VIC AB 2-0 CT1 TAPERPNT 27 (SUTURE) ×3 IMPLANT
SYR 30ML LL (SYRINGE) ×2 IMPLANT
TOWEL OR 17X24 6PK STRL BLUE (TOWEL DISPOSABLE) ×2 IMPLANT
TOWEL OR 17X26 10 PK STRL BLUE (TOWEL DISPOSABLE) ×2 IMPLANT
TRAY FOLEY CATH 16FR SILVER (SET/KITS/TRAYS/PACK) ×2 IMPLANT
TUBE CONNECTING 12X1/4 (SUCTIONS) ×2 IMPLANT
YANKAUER SUCT BULB TIP NO VENT (SUCTIONS) ×2 IMPLANT

## 2015-09-20 NOTE — Interval H&P Note (Signed)
History and Physical Interval Note:  09/20/2015 7:11 AM  [name]  has presented today for surgery, with the diagnosis of Primary localized OA right knee  The various methods of treatment have been discussed with the patient and family. After consideration of risks, benefits and other options for treatment, the patient has consented to  Procedure(s): TOTAL KNEE ARTHROPLASTY (Right) as a surgical intervention .  The patient's history has been reviewed, patient examined, no change in status, stable for surgery.  I have reviewed the patient's chart and labs.  Questions were answered to the patient's satisfaction.     Elsie Saas A

## 2015-09-20 NOTE — Evaluation (Signed)
Physical Therapy Evaluation Patient Details Name: [name] MRN: [id number] DOB: 05/24/1945 Today's Date: 09/20/2015   History of Present Illness  Pt admitted 8/7 for elective R TKA.  Clinical Impression  Pt is s/p TKA resulting in the deficits listed below (see PT Problem List). Pt tolerated OOB well for first time getting up. Pt will benefit from skilled PT to increase their independence and safety with mobility to allow discharge to the venue listed below.      Follow Up Recommendations Home health PT;Supervision/Assistance - 24 hour    Equipment Recommendations  3in1 (PT)    Recommendations for Other Services       Precautions / Restrictions Precautions Precautions: Knee Precaution Booklet Issued: Yes (comment) Required Braces or Orthoses: Knee Immobilizer - Right Knee Immobilizer - Right: On when out of bed or walking Restrictions Weight Bearing Restrictions: Yes RLE Weight Bearing: Weight bearing as tolerated      Mobility  Bed Mobility Overal bed mobility: Needs Assistance Bed Mobility: Supine to Sit     Supine to sit: Min assist     General bed mobility comments: max directional v/c's, HOB elevated, increased time, assist to scoot hips to EOB  Transfers Overall transfer level: Needs assistance   Transfers: Sit to/from Stand Sit to Stand: Min assist         General transfer comment: max directional v/c's for hand placement, minA to steady pt during hand transition  Ambulation/Gait Ambulation/Gait assistance: Min assist Ambulation Distance (Feet): 2 Feet (to chair) Assistive device: Rolling walker (2 wheeled) Gait Pattern/deviations: Step-to pattern;Decreased step length - right;Decreased stance time - right Gait velocity: decreased Gait velocity interpretation: Below normal speed for age/gender General Gait Details: v/c's for sequencing, pt with "head sweat" pt reports this to be normal  Stairs            Wheelchair Mobility     Modified Rankin (Stroke Patients Only)       Balance Overall balance assessment: Needs assistance (just had surgery and needs RW for standing/ambualtion)                                           Pertinent Vitals/Pain Pain Assessment: 0-10 Pain Score: 3  Pain Location: R knee Pain Descriptors / Indicators: Aching Pain Intervention(s): Premedicated before session    Home Living Family/patient expects to be discharged to:: Private residence Living Arrangements: Spouse/significant other Available Help at Discharge: Family;Available 24 hours/day Type of Home: House Home Access: Stairs to enter Entrance Stairs-Rails: Right Entrance Stairs-Number of Steps: 3 (shallow steps) Home Layout: One level Home Equipment: Walker - 2 wheels;Cane - single point;Shower seat      Prior Function Level of Independence: Needs assistance   Gait / Transfers Assistance Needed: used cane  ADL's / Homemaking Assistance Needed: spouse doing housework and grocery shopping        Hand Dominance        Extremity/Trunk Assessment   Upper Extremity Assessment: Overall WFL for tasks assessed           Lower Extremity Assessment: RLE deficits/detail RLE Deficits / Details: pt able to complete quad set and hold and toelrate 50% WBing through it    Cervical / Trunk Assessment: Normal  Communication   Communication: No difficulties  Cognition Arousal/Alertness: Awake/alert Behavior During Therapy: Anxious (nervous) Overall Cognitive Status: Within Functional Limits for tasks assessed  General Comments      Exercises Total Joint Exercises Ankle Circles/Pumps: AROM;Both;10 reps;Supine Quad Sets: AROM;Right;10 reps;Supine Gluteal Sets: AROM;Both;10 reps;Seated Long Arc Quad: AROM;5 reps;Seated      Assessment/Plan    PT Assessment Patient needs continued PT services  PT Diagnosis Difficulty walking;Generalized weakness;Acute pain    PT Problem List Decreased strength;Decreased range of motion;Decreased activity tolerance;Decreased balance;Decreased coordination;Decreased mobility  PT Treatment Interventions DME instruction;Gait training;Stair training;Functional mobility training;Therapeutic activities;Therapeutic exercise   PT Goals (Current goals can be found in the Care Plan section) Acute Rehab PT Goals Patient Stated Goal: home PT Goal Formulation: With patient Time For Goal Achievement: 09/27/15 Potential to Achieve Goals: Good    Frequency 7X/week   Barriers to discharge        Co-evaluation               End of Session Equipment Utilized During Treatment: Gait belt Activity Tolerance: Patient tolerated treatment well Patient left: in chair;with call bell/phone within reach Nurse Communication: Mobility status         Time: [id number] PT Time Calculation (min) (ACUTE ONLY): 35 min   Charges:   PT Evaluation $PT Eval Moderate Complexity: 1 Procedure PT Treatments $Gait Training: 8-22 mins   PT G CodesKingsley [name] 09/20/2015, 4:46 PM  [name], PT, DPT Pager #: [phone] Office #: [phone]

## 2015-09-20 NOTE — Anesthesia Postprocedure Evaluation (Signed)
Anesthesia Post Note  Patient: [name]  Procedure(s) Performed: Procedure(s) (LRB): TOTAL KNEE ARTHROPLASTY (Right)  Patient location during evaluation: PACU Anesthesia Type: Spinal Level of consciousness: awake and alert and patient cooperative Pain management: pain level controlled Vital Signs Assessment: post-procedure vital signs reviewed and stable Respiratory status: spontaneous breathing Cardiovascular status: stable Postop Assessment: spinal receding Anesthetic complications: no    Last Vitals:  Vitals:   09/20/15 1030 09/20/15 1045  BP: (!) 110/45 (!) 118/59  Pulse: 97 100  Resp: 18 17  Temp:      Last Pain:  Vitals:   09/20/15 1030  TempSrc:   PainSc: 0-No pain                 Dashan Chizmar EDWARD

## 2015-09-20 NOTE — Anesthesia Procedure Notes (Signed)
Anesthesia Regional Block:  Adductor canal block  Pre-Anesthetic Checklist: ,, timeout performed, Correct Patient, Correct Site, Correct Laterality, Correct Procedure, Correct Position, site marked, Risks and benefits discussed,  Surgical consent,  Pre-op evaluation,  At surgeon's request and post-op pain management  Laterality: Right  Prep: Maximum Sterile Barrier Precautions used, chloraprep       Needles:  Injection technique: Single-shot  Needle Type: Stimulator Needle - 80          Additional Needles:  Procedures: ultrasound guided (picture in chart) Adductor canal block Narrative:  Start time: 09/20/2015 9:24 AM End time: 09/20/2015 9:28 AM Injection made incrementally with aspirations every 5 mL.  Performed by: Personally   Additional Notes: Pt accepts procedure w/ risks. Procedure w/o difficulty. GES

## 2015-09-20 NOTE — Op Note (Signed)
MRN:     [id number] DOB/AGE:    05/14/1945 / 70 y.o.       OPERATIVE REPORT    DATE OF PROCEDURE:  09/20/2015       PREOPERATIVE DIAGNOSIS:   Primary localized OA right knee      Estimated body mass index is 36.14 kg/m as calculated from the following:   Height as of 09/10/15: 5\' 3"  (1.6 m).   Weight as of this encounter: 92.5 kg (204 lb).                                                        POSTOPERATIVE DIAGNOSIS:   same                                                                      PROCEDURE:  Procedure(s): TOTAL KNEE ARTHROPLASTY Using Depuy Attune RP implants #4 Femur, #4Tibia, 43mm  RP bearing, 32 Patella     SURGEON: Shiv Shuey A    ASSISTANT:  Kirstin Shepperson PA-C   (Present and scrubbed throughout the case, critical for assistance with exposure, retraction, instrumentation, and closure.)         ANESTHESIA: Spinal with Adductor Nerve Block     TOURNIQUET TIME: [id number]   COMPLICATIONS:  None     SPECIMENS: None   INDICATIONS FOR PROCEDURE: The patient has  djd right knee, varus deformities, XR shows bone on bone arthritis. Patient has failed all conservative measures including anti-inflammatory medicines, narcotics, attempts at  exercise and weight loss, cortisone injections and viscosupplementation.  Risks and benefits of surgery have been discussed, questions answered.   DESCRIPTION OF PROCEDURE: The patient identified by armband, received  right femoral nerve block and IV antibiotics, in the holding area at [facility]. Patient taken to the operating room, appropriate anesthetic  monitors were attached General endotracheal anesthesia induced with  the patient in supine position, Foley catheter was inserted. Tourniquet  applied high to the operative thigh. Lateral post and foot positioner  applied to the table, the lower extremity was then prepped and draped  in usual sterile fashion from the ankle to the tourniquet. Time-out procedure was performed.  The limb was wrapped with an Esmarch bandage and the tourniquet inflated to 365 mmHg. We began the operation by making the anterior midline incision starting at handbreadth above the patella going over the patella 1 cm medial to and  4 cm distal to the tibial tubercle. Small bleeders in the skin and the  subcutaneous tissue identified and cauterized. Transverse retinaculum was incised and reflected medially and a medial parapatellar arthrotomy was accomplished. the patella was everted and theprepatellar fat pad resected. The superficial medial collateral  ligament was then elevated from anterior to posterior along the proximal  flare of the tibia and anterior half of the menisci resected. The knee was hyperflexed exposing bone on bone arthritis. Peripheral and notch osteophytes as well as the cruciate ligaments were then resected. We continued to  work our way around posteriorly along the proximal tibia, and externally  rotated the tibia subluxing it out  from underneath the femur. A McHale  retractor was placed through the notch and a lateral Hohmann retractor  placed, and we then drilled through the proximal tibia in line with the  axis of the tibia followed by an intramedullary guide rod and 2-degree  posterior slope cutting guide. The tibial cutting guide was pinned into place  allowing resection of 4 mm of bone medially and about 6 mm of bone  laterally because of her varus deformity. Satisfied with the tibial resection, we then  entered the distal femur 2 mm anterior to the PCL origin with the  intramedullary guide rod and applied the distal femoral cutting guide  set at 18mm, with 5 degrees of valgus. This was pinned along the  epicondylar axis. At this point, the distal femoral cut was accomplished without difficulty. We then sized for a #4 femoral component and pinned the guide in 3 degrees of external rotation.The chamfer cutting guide was pinned into place. The anterior, posterior, and  chamfer cuts were accomplished without difficulty followed by  the  RP box cutting guide and the box cut. We also removed posterior osteophytes from the posterior femoral condyles. At this  time, the knee was brought into full extension. We checked our  extension and flexion gaps and found them symmetric at 19mm.  The patella thickness measured at 21 mm. We set the cutting guide at 13 and removed the posterior 8 mm  of the patella sized for 32 button and drilled the lollipop. The knee  was then once again hyperflexed exposing the proximal tibia. We sized for a #4 tibial base plate, applied the smokestack and the conical reamer followed by the the Delta fin keel punch. We then hammered into place the  RP trial femoral component, inserted a 1 trial bearing, trial patellar button, and took the knee through range of motion from 0-130 degrees. No thumb pressure was required for patellar  tracking. At this point, all trial components were removed, a double batch of DePuy HV cement  was mixed and applied to all bony metallic mating surfaces except for the posterior condyles of the femur itself. In order, we  hammered into place the tibial tray and removed excess cement, the femoral component and removed excess cement, a 69mm  RP bearing  was inserted, and the knee brought to full extension with compression.  The patellar button was clamped into place, and excess cement  removed. While the cement cured the wound was irrigated out with normal saline solution pulse lavage.. Ligament stability and patellar tracking were checked and found to be excellent.. The parapatellar arthrotomy was closed with  #1 Vicryl suture. The subcutaneous tissue with 0 and 2-0 undyed  Vicryl suture, and 4-0 Monocryl.. A dressing of Aquaseal,  4 x 4, dressing sponges, Webril, and Ace wrap applied. Needle and sponge count were correct times 2.The patient awakened, extubated, and taken to recovery room without difficulty. Vascular status  was normal, pulses 2+ and symmetric.   [name] A 09/20/2015, 9:03 AM

## 2015-09-20 NOTE — Transfer of Care (Signed)
Immediate Anesthesia Transfer of Care Note  Patient: [name]  Procedure(s) Performed: Procedure(s): TOTAL KNEE ARTHROPLASTY (Right)  Patient Location: PACU  Anesthesia Type:Spinal and MAC combined with regional for post-op pain  Level of Consciousness: patient cooperative, lethargic and responds to stimulation  Airway & Oxygen Therapy: Patient Spontanous Breathing and Patient connected to nasal cannula oxygen  Post-op Assessment: Report given to RN and Post -op Vital signs reviewed and stable  Post vital signs: Reviewed and stable  Last Vitals:  Vitals:   09/07/15 1400 09/20/15 0603  BP: (!) 166/75 (!) 166/79  Pulse: 96 96  Resp:  20  Temp: 36.8 C 36.8 C    Last Pain:  Vitals:   09/20/15 0603  TempSrc: Oral      Patients Stated Pain Goal: 3 ([id number])  Complications: No apparent anesthesia complications

## 2015-09-20 NOTE — Progress Notes (Signed)
Orthopedic Tech Progress Note Patient Details:  [name] [birth year] [id number]  CPM Right Knee CPM Right Knee: On Right Knee Flexion (Degrees): 90 Right Knee Extension (Degrees): 0 Additional Comments: Trapeze bar and foot roll   [name] 09/20/2015, 10:11 AM

## 2015-09-20 NOTE — Anesthesia Procedure Notes (Signed)
Spinal  Start time: 09/20/2015 7:20 AM End time: 09/20/2015 7:24 AM Staffing Anesthesiologist: Kate Sable Performed: anesthesiologist  Preanesthetic Checklist Completed: patient identified, site marked, surgical consent, pre-op evaluation, timeout performed, IV checked, risks and benefits discussed and monitors and equipment checked Spinal Block Patient position: right lateral decubitus Prep: ChloraPrep Patient monitoring: cardiac monitor, continuous pulse ox, blood pressure and heart rate Approach: midline Location: L3-4 Injection technique: single-shot Needle Needle type: Whitacre  Needle gauge: 22 G Needle length: 9 cm Needle insertion depth: 4 cm Assessment Sensory level: T10 Additional Notes Pt accepts procedure w/ risks. CSF clear free flow 12mg  Marcaine w/ epi w/o difficulty. GES

## 2015-09-20 NOTE — Anesthesia Procedure Notes (Signed)
Procedure Name: MAC Date/Time: 09/20/2015 7:18 AM Performed by: Mervyn Gay Pre-anesthesia Checklist: Patient identified, Patient being monitored, Timeout performed, Emergency Drugs available and Suction available Patient Re-evaluated:Patient Re-evaluated prior to inductionOxygen Delivery Method: Simple face mask and Nasal cannula Preoxygenation: Pre-oxygenation with 100% oxygen Number of attempts: 1 Placement Confirmation: positive ETCO2 Dental Injury: Teeth and Oropharynx as per pre-operative assessment

## 2015-09-21 ENCOUNTER — Encounter (HOSPITAL_COMMUNITY): Payer: Self-pay | Admitting: Orthopedic Surgery

## 2015-09-21 LAB — CBC
HCT: 32.5 % — ABNORMAL LOW (ref 36.0–46.0)
Hemoglobin: 10.1 g/dL — ABNORMAL LOW (ref 12.0–15.0)
MCH: 26.3 pg (ref 26.0–34.0)
MCHC: 31.1 g/dL (ref 30.0–36.0)
MCV: 84.6 fL (ref 78.0–100.0)
Platelets: 385 10*3/uL (ref 150–400)
RBC: 3.84 MIL/uL — ABNORMAL LOW (ref 3.87–5.11)
RDW: 16 % — AB (ref 11.5–15.5)
WBC: 15.4 10*3/uL — ABNORMAL HIGH (ref 4.0–10.5)

## 2015-09-21 LAB — BASIC METABOLIC PANEL
Anion gap: 9 (ref 5–15)
BUN: 15 mg/dL (ref 6–20)
CALCIUM: 8.5 mg/dL — AB (ref 8.9–10.3)
CO2: 24 mmol/L (ref 22–32)
CREATININE: 0.89 mg/dL (ref 0.44–1.00)
Chloride: 106 mmol/L (ref 101–111)
GFR calc non Af Amer: >60 mL/min (ref >60)
Glucose, Bld: 143 mg/dL — ABNORMAL HIGH (ref 65–99)
Potassium: 4.2 mmol/L (ref 3.5–5.1)
SODIUM: 139 mmol/L (ref 135–145)

## 2015-09-21 MED ORDER — CEPHALEXIN 500 MG PO CAPS
500.0000 mg | ORAL_CAPSULE | Freq: Four times a day (QID) | ORAL | Status: DC
  Administered 2015-09-21 – 2015-09-22 (×4): 500 mg via ORAL
  Filled 2015-09-21 (×4): qty 1

## 2015-09-21 NOTE — Evaluation (Signed)
Occupational Therapy Evaluation and Discharge Patient Details Name: [name] MRN: [id number] DOB: [birth year] Today's Date: 09/21/2015    History of Present Illness Pt admitted 8/7 for elective R TKA.   Clinical Impression   Pt was independent in self care and ambulating with a cane prior to admission. She presents with moderate post operative pain, generalized weakness and impaired balance. Educated pt in use of AE and compensatory techniques  for LB bathing and dressing, multiple uses of 3 in 1, shower transfers, safe footwear and transporting items safely with RW. Pt indicating understanding of all education. No further OT needs.    Follow Up Recommendations  No OT follow up    Equipment Recommendations  None recommended by OT (3 in 1 in room)    Recommendations for Other Services       Precautions / Restrictions Precautions Precautions: Knee;Fall Precaution Booklet Issued: Yes (comment) Required Braces or Orthoses: Knee Immobilizer - Right Knee Immobilizer - Right: On when out of bed or walking Restrictions Weight Bearing Restrictions: Yes RLE Weight Bearing: Weight bearing as tolerated      Mobility Bed Mobility               General bed mobility comments: OOB in chair upon arrival  Transfers Overall transfer level: Needs assistance Equipment used: Rolling walker (2 wheeled) Transfers: Sit to/from Stand Sit to Stand: Min guard         General transfer comment: cues for safe hand placement and technique    Balance                                            ADL Overall ADL's : Needs assistance/impaired Eating/Feeding: Independent;Sitting   Grooming: Wash/dry hands;Set up;Sitting   Upper Body Bathing: Set up;Sitting   Lower Body Bathing: Minimal assistance;Sit to/from stand Lower Body Bathing Details (indicate cue type and reason): recommended long handled bath sponge Upper Body Dressing : Set up;Sitting   Lower Body  Dressing: Sit to/from stand;Moderate assistance Lower Body Dressing Details (indicate cue type and reason): instructed to use her reacher to doff socks and assist with starting pants over her feet dressing operated leg first, educated in safe footwear Toilet Transfer: Min guard;RW;Ambulation;BSC (over toilet)   Toileting- Water quality scientist and Hygiene: Min guard;Sit to/from Nurse, children's Details (indicate cue type and reason): educated in technique, placement of walker Functional mobility during ADLs: Min Scientist, research (physical sciences)     Praxis      Pertinent Vitals/Pain Pain Assessment: 0-10 Pain Score: 5  Pain Location: R knee Pain Descriptors / Indicators: Sore;Operative site guarding Pain Intervention(s): Monitored during session;Ice applied;Repositioned     Hand Dominance Right   Extremity/Trunk Assessment Upper Extremity Assessment Upper Extremity Assessment: Overall WFL for tasks assessed   Lower Extremity Assessment Lower Extremity Assessment: Defer to PT evaluation   Cervical / Trunk Assessment Cervical / Trunk Assessment: Normal   Communication Communication Communication: No difficulties   Cognition Arousal/Alertness: Awake/alert Behavior During Therapy: WFL for tasks assessed/performed Overall Cognitive Status: Within Functional Limits for tasks assessed                     General Comments       Exercises      Shoulder Instructions  Home Living Family/patient expects to be discharged to:: Private residence Living Arrangements: Spouse/significant other Available Help at Discharge: Family;Available 24 hours/day Type of Home: House Home Access: Stairs to enter [employer] of Steps: 3 Entrance Stairs-Rails: Right Home Layout: One level     Bathroom Shower/Tub: Occupational psychologist: Standard     Home Equipment: Environmental consultant - 2 wheels;Cane - single point;Shower  seat;Adaptive equipment Adaptive Equipment: Reacher        Prior Functioning/Environment Level of Independence: Needs assistance  Gait / Transfers Assistance Needed: used cane ADL's / Homemaking Assistance Needed: spouse doing housework and grocery shopping        OT Diagnosis: Generalized weakness;Acute pain   OT Problem List:     OT Treatment/Interventions:      OT Goals(Current goals can be found in the care plan section) Acute Rehab OT Goals Patient Stated Goal: home OT Goal Formulation: With patient  OT Frequency:     Barriers to D/C:            Co-evaluation              End of Session Equipment Utilized During Treatment: Gait belt;Rolling walker;Right knee immobilizer   Activity Tolerance: Patient tolerated treatment well Patient left: in chair;with call bell/phone within reach (in bone foam)   [id number] OT Time Calculation (min): 26 min Charges:  OT Evaluation $OT Eval Low Complexity: 1 Procedure OT Treatments $Self Care/Home Management : 8-22 mins G-Codes:    Malka So 09/21/2015, 2:33 PM  [phone]

## 2015-09-21 NOTE — Progress Notes (Signed)
Physical Therapy Treatment Patient Details Name: [name] MRN: [id number] DOB: [birth year] Today's Date: 09/21/2015    History of Present Illness Pt admitted 8/7 for elective R TKA.    PT Comments    Patient is progressing well toward mobility goals. Improved gait mechanics and WB on R LE this session. Tolerated gait/stair training well. Husband present for session. Current plan remains appropriate.   Follow Up Recommendations  Home health PT;Supervision/Assistance - 24 hour     Equipment Recommendations  3in1 (PT)    Recommendations for Other Services       Precautions / Restrictions Precautions Precautions: Knee Precaution Booklet Issued: Yes (comment) Required Braces or Orthoses: Knee Immobilizer - Right Knee Immobilizer - Right: On when out of bed or walking Restrictions Weight Bearing Restrictions: Yes RLE Weight Bearing: Weight bearing as tolerated    Mobility  Bed Mobility Overal bed mobility: Needs Assistance Bed Mobility: Sit to Supine       Sit to supine: Min guard   General bed mobility comments: OOB in chair upon arrival; min guard for safety to return to supine; no physical assist needed  Transfers Overall transfer level: Needs assistance Equipment used: Rolling walker (2 wheeled) Transfers: Sit to/from Stand Sit to Stand: Min guard         General transfer comment: carry over of safe hand placement  Ambulation/Gait Ambulation/Gait assistance: Supervision Ambulation Distance (Feet): 180 Feet Assistive device: Rolling walker (2 wheeled) Gait Pattern/deviations: Step-through pattern;Decreased weight shift to right;Decreased stance time - left;Decreased stride length Gait velocity: decreased   General Gait Details: pt with improved gait mechanics and increased WB on R LE this session; cues for R heel strike and flexion during swing phase   Stairs Stairs: Yes Stairs assistance: Min guard Stair Management: One rail Right;Sideways Number  of Stairs: 3 General stair comments: educated pt and husband on sequencing and technique; R hand rail at home; good safety awareness; min guard for safety; no physical assist needed  Wheelchair Mobility    Modified Rankin (Stroke Patients Only)       Balance Overall balance assessment: Needs assistance   Sitting balance-Leahy Scale: Good       Standing balance-Leahy Scale: Fair Standing balance comment:  (static stand without UE support)                    Cognition Arousal/Alertness: Awake/alert Behavior During Therapy: Anxious (nervous) Overall Cognitive Status: Within Functional Limits for tasks assessed                      Exercises Total Joint Exercises Quad Sets: AROM;Right;10 reps;Seated Heel Slides: AROM;Right;10 reps;Seated Straight Leg Raises: AROM;Right;10 reps;Seated Knee Flexion: AROM;Right;Seated;5 reps;Other (comment) (10 sec hold) Goniometric ROM: 3-60    General Comments        Pertinent Vitals/Pain Pain Assessment: 0-10 Pain Score: 3  Pain Location: R knee Pain Descriptors / Indicators: Sore Pain Intervention(s): Limited activity within patient's tolerance;Monitored during session;Premedicated before session;Repositioned    Home Living Family/patient expects to be discharged to:: Private residence Living Arrangements: Spouse/significant other Available Help at Discharge: Family;Available 24 hours/day Type of Home: House Home Access: Stairs to enter Entrance Stairs-Rails: Right Home Layout: One level Home Equipment: Walker - 2 wheels;Cane - single point;Shower seat;Adaptive equipment      Prior Function Level of Independence: Needs assistance  Gait / Transfers Assistance Needed: used cane ADL's / Homemaking Assistance Needed: spouse doing housework and grocery shopping     PT  Goals (current goals can now be found in the care plan section) Acute Rehab PT Goals Patient Stated Goal: home PT Goal Formulation: With  patient Time For Goal Achievement: 09/27/15 Potential to Achieve Goals: Good Progress towards PT goals: Progressing toward goals    Frequency  7X/week    PT Plan Current plan remains appropriate    Co-evaluation             End of Session Equipment Utilized During Treatment: Gait belt Activity Tolerance: Patient tolerated treatment well Patient left: with call bell/phone within reach;with family/visitor present;in bed;in CPM     Time: [id number] PT Time Calculation (min) (ACUTE ONLY): 28 min  Charges:  $Gait Training: 8-22 mins $Therapeutic Activity: 8-22 mins                    G Codes:      [place] April, PTA Pager: [phone]   09/21/2015, 3:53 PM

## 2015-09-21 NOTE — Progress Notes (Signed)
Orthopedic Tech Progress Note Patient Details:  [name] 13-Oct-1945 [id number]  Patient ID: Virl Cagey, female   DOB: 1945/03/17, 70 y.o.   MRN: [id number] Applied cpm 0-40. Pt had to much pain to go higher than 0-40. Asked rn to raise to 0-60 after giving pain meds.  Karolee Stamps 09/21/2015, 6:03 AM

## 2015-09-21 NOTE — Progress Notes (Signed)
Subjective: 1 Day Post-Op Procedure(s) (LRB): TOTAL KNEE ARTHROPLASTY (Right) Patient reports pain as 8 on 0-10 scale.    Objective: Vital signs in last 24 hours: Temp:  [97.5 F (36.4 C)-98.4 F (36.9 C)] 98.2 F (36.8 C) (08/08 0541) Pulse Rate:  [71-101] 81 (08/08 0541) Resp:  [15-20] 18 (08/08 0541) BP: (103-153)/(43-70) 131/64 (08/08 0541) SpO2:  [92 %-100 %] 92 % (08/08 0541)  Intake/Output from previous day: 08/07 0701 - 08/08 0700 In: 2120 [P.O.:720; I.V.:1400] Out: 2625 [Urine:2575; Blood:50] Intake/Output this shift: No intake/output data recorded.   Recent Labs  09/20/15 1121 09/21/15 0326  HGB 11.5* 10.1*    Recent Labs  09/20/15 1121 09/21/15 0326  WBC 11.7* 15.4*  RBC 4.28 3.84*  HCT 36.5 32.5*  PLT 378 385    Recent Labs  09/20/15 1121 09/21/15 0326  NA  --  139  K  --  4.2  CL  --  106  CO2  --  24  BUN  --  15  CREATININE 0.89 0.89  GLUCOSE  --  143*  CALCIUM  --  8.5*   No results for input(s): LABPT, INR in the last 72 hours.  ABD soft Neurovascular intact Sensation intact distally Intact pulses distally Incision: dressing C/D/I  Assessment/Plan: 1 Day Post-Op Procedure(s) (LRB): TOTAL KNEE ARTHROPLASTY (Right)  Principal Problem:   Primary localized osteoarthritis of right knee Active Problems:   Insomnia   Essential hypertension   Hyperlipidemia   Osteopenia   Anxiety associated with depression  Advance diet Up with therapy Plan for discharge tomorrow Discharge home with home health  [name] 09/21/2015, 9:17 AM

## 2015-09-21 NOTE — Progress Notes (Signed)
Orthopedic Tech Progress Note Patient Details:  [name] 05-13-1945 [id number]  CPM Left Knee CPM Left Knee: On CPM Right Knee CPM Right Knee: Off Right Knee Flexion (Degrees): 40 Right Knee Extension (Degrees): 0 Additional Comments: Trapeze bar and foot roll   [name] 09/21/2015, 1:43 PM

## 2015-09-21 NOTE — Care Management Important Message (Signed)
Important Message  Patient Details  Name: [name] MRN: [id number] Date of Birth: [birth year]   Medicare Important Message Given:  Yes    Loann Quill 09/21/2015, 8:37 AM

## 2015-09-21 NOTE — Discharge Planning (Signed)
Patient seen on rounds with PA. Doing very well. Up to bathroom and ambulated in the hall. Pain is biggest issue this am.  Patient is set up with [facility] for [place].  She has a rolling walker at home and 3n1 is to be delivered to her room from [facility].  Her follow appointment with Dr. Noemi Chapel is set for 10/04/15 @ 215p. She is scheduled to begin OPPT at [facility] on [facility] 10/04/15 @ 3p.  No other needs at this time.  Will follow as outpatient.   Ladell Heads, [place]  [facility]  [phone]

## 2015-09-21 NOTE — Progress Notes (Signed)
Orthopedic Tech Progress Note Patient Details:  [name] [birth year] [id number] Ortho visit put on cpm at 1825  Patient ID: [name], female   DOB: Mar 12, 1945, 70 y.o.   MRN: [id number]   [name] 09/21/2015, 6:27 PM

## 2015-09-21 NOTE — Progress Notes (Signed)
   09/21/15 1630  Clinical Encounter Type  Visited With Patient  Visit Type Initial  Spiritual Encounters  Spiritual Needs Emotional  chaplain visited patient on afternoon rounds.  Patient indicated she was feeling a little wonky.  Chaplain made further support available if needed.

## 2015-09-21 NOTE — Progress Notes (Signed)
Physical Therapy Treatment Patient Details Name: [name] MRN: [id number] DOB: 07/20/1945 Today's Date: 09/21/2015    History of Present Illness Pt admitted 8/7 for elective R TKA.    PT Comments    Patient is progressing toward mobility goals. Tolerated gait training and therex well. Continue to progress as tolerated with anticipated d/c home with HHPT.   Follow Up Recommendations  Home health PT;Supervision/Assistance - 24 hour     Equipment Recommendations  3in1 (PT)    Recommendations for Other Services       Precautions / Restrictions Precautions Precautions: Knee Precaution Booklet Issued: Yes (comment) Required Braces or Orthoses: Knee Immobilizer - Right Knee Immobilizer - Right: On when out of bed or walking Restrictions Weight Bearing Restrictions: Yes RLE Weight Bearing: Weight bearing as tolerated    Mobility  Bed Mobility               General bed mobility comments: OOB in chair upon arrival  Transfers Overall transfer level: Needs assistance Equipment used: Rolling walker (2 wheeled) Transfers: Sit to/from Stand Sit to Stand: Min guard         General transfer comment: cues for safe hand placement and technique  Ambulation/Gait Ambulation/Gait assistance: Min guard Ambulation Distance (Feet): 100 Feet Assistive device: Rolling walker (2 wheeled) Gait Pattern/deviations: Step-through pattern;Decreased stance time - right;Decreased step length - left;Decreased stride length Gait velocity: decreased   General Gait Details: slow, steady gait; cues for posture, sequencing, and increased knee flexion during swing phase; encouraged pt to increased WB on R LE   Stairs            Wheelchair Mobility    Modified Rankin (Stroke Patients Only)       Balance                                    Cognition Arousal/Alertness: Awake/alert Behavior During Therapy: Anxious (nervous) Overall Cognitive Status: Within  Functional Limits for tasks assessed                      Exercises Total Joint Exercises Quad Sets: AROM;Right;10 reps;Seated Heel Slides: AROM;Right;10 reps;Seated Straight Leg Raises: AROM;Right;10 reps;Seated Knee Flexion: AROM;Right;Seated;5 reps;Other (comment) (10 sec hold) Goniometric ROM: 3-60    General Comments        Pertinent Vitals/Pain Pain Assessment: 0-10 Pain Score: 6  Pain Location: R knee Pain Descriptors / Indicators: Grimacing;Guarding;Sore Pain Intervention(s): Limited activity within patient's tolerance;Monitored during session;Premedicated before session;Repositioned;Ice applied    Home Living                      Prior Function            PT Goals (current goals can now be found in the care plan section) Acute Rehab PT Goals Patient Stated Goal: home PT Goal Formulation: With patient Time For Goal Achievement: 09/27/15 Potential to Achieve Goals: Good Progress towards PT goals: Progressing toward goals    Frequency  7X/week    PT Plan Current plan remains appropriate    Co-evaluation             End of Session Equipment Utilized During Treatment: Gait belt Activity Tolerance: Patient tolerated treatment well Patient left: in chair;with call bell/phone within reach;with family/visitor present     Time: [id number] PT Time Calculation (min) (ACUTE ONLY): 26 min  Charges:  $Gait Training: 8-22  mins $Therapeutic Exercise: 8-22 mins                    G Codes:      Salina April, PTA Pager: [phone]   09/21/2015, 2:14 PM

## 2015-09-22 LAB — CBC
HCT: 36 % (ref 36.0–46.0)
Hemoglobin: 11.2 g/dL — ABNORMAL LOW (ref 12.0–15.0)
MCH: 26 pg (ref 26.0–34.0)
MCHC: 31.1 g/dL (ref 30.0–36.0)
MCV: 83.7 fL (ref 78.0–100.0)
PLATELETS: 319 10*3/uL (ref 150–400)
RBC: 4.3 MIL/uL (ref 3.87–5.11)
RDW: 16.2 % — ABNORMAL HIGH (ref 11.5–15.5)
WBC: 15.5 10*3/uL — ABNORMAL HIGH (ref 4.0–10.5)

## 2015-09-22 LAB — BASIC METABOLIC PANEL
Anion gap: 13 (ref 5–15)
BUN: 22 mg/dL — AB (ref 6–20)
CALCIUM: 8.7 mg/dL — AB (ref 8.9–10.3)
CO2: 26 mmol/L (ref 22–32)
CREATININE: 0.81 mg/dL (ref 0.44–1.00)
Chloride: 103 mmol/L (ref 101–111)
GFR calc Af Amer: >60 mL/min (ref >60)
Glucose, Bld: 138 mg/dL — ABNORMAL HIGH (ref 65–99)
POTASSIUM: 4.1 mmol/L (ref 3.5–5.1)
SODIUM: 142 mmol/L (ref 135–145)

## 2015-09-22 MED ORDER — OXYCODONE HCL 5 MG PO TABS: ORAL_TABLET | ORAL | 0 refills | Status: DC

## 2015-09-22 MED ORDER — DOCUSATE SODIUM 100 MG PO CAPS: ORAL_CAPSULE | ORAL | 0 refills | Status: DC

## 2015-09-22 MED ORDER — ENOXAPARIN SODIUM 30 MG/0.3ML ~~LOC~~ SOLN: SUBCUTANEOUS | 0 refills | Status: DC

## 2015-09-22 MED ORDER — CEPHALEXIN 500 MG PO CAPS: 500.0000 mg | ORAL_CAPSULE | Freq: Four times a day (QID) | ORAL | 0 refills | Status: DC

## 2015-09-22 MED ORDER — POLYETHYLENE GLYCOL 3350 17 G PO PACK: PACK | ORAL | 0 refills | Status: DC

## 2015-09-22 NOTE — Progress Notes (Signed)
Physical Therapy Treatment Patient Details Name: [name] MRN: [id number] DOB: May 18, 1945 Today's Date: 09/22/2015    History of Present Illness Pt admitted 8/7 for elective R TKA.    PT Comments    Patient is making good progress with PT.  From a mobility standpoint anticipate patient will be ready for DC home when medically ready.     Follow Up Recommendations  Home health PT;Supervision/Assistance - 24 hour     Equipment Recommendations  3in1 (PT)    Recommendations for Other Services       Precautions / Restrictions Precautions Precautions: Knee Precaution Booklet Issued: Yes (comment) Restrictions Weight Bearing Restrictions: Yes RLE Weight Bearing: Weight bearing as tolerated    Mobility  Bed Mobility Overal bed mobility: Modified Independent Bed Mobility: Supine to Sit       Sit to supine: Supervision   General bed mobility comments: increased time  Transfers Overall transfer level: Needs assistance Equipment used: Rolling walker (2 wheeled) Transfers: Sit to/from Stand Sit to Stand: Supervision         General transfer comment: safe hand placement and technique  Ambulation/Gait Ambulation/Gait assistance: Supervision Ambulation Distance (Feet): 24 Feet Assistive device: Rolling walker (2 wheeled) Gait Pattern/deviations: Step-through pattern;Decreased stride length Gait velocity: decreased   General Gait Details: pt continues to demo improved gait mechanics and with decreased reliance on UE support   Stairs            Wheelchair Mobility    Modified Rankin (Stroke Patients Only)       Balance     Sitting balance-Leahy Scale: Good       Standing balance-Leahy Scale: Fair                      Cognition Arousal/Alertness: Awake/alert Behavior During Therapy: Anxious (nervous) Overall Cognitive Status: Within Functional Limits for tasks assessed                      Exercises Total Joint  Exercises Quad Sets: AROM;Right;10 reps;Seated Heel Slides: AROM;Right;10 reps;Supine Hip ABduction/ADduction: AROM;Right;10 reps;Supine Straight Leg Raises: AROM;Right;10 reps;Supine Long Arc Quad: AROM;Right;10 reps;Seated Knee Flexion: AROM;Right;5 reps;Seated Goniometric ROM: 0-75    General Comments        Pertinent Vitals/Pain Pain Assessment: 0-10 Pain Score: 3  Pain Location: R knee with therex Pain Descriptors / Indicators: Sore Pain Intervention(s): Monitored during session;Premedicated before session;Repositioned;Limited activity within patient's tolerance    Home Living                      Prior Function            PT Goals (current goals can now be found in the care plan section) Acute Rehab PT Goals Patient Stated Goal: home PT Goal Formulation: With patient Time For Goal Achievement: 09/27/15 Potential to Achieve Goals: Good Progress towards PT goals: Progressing toward goals    Frequency  7X/week    PT Plan Current plan remains appropriate    Co-evaluation             End of Session Equipment Utilized During Treatment: Gait belt Activity Tolerance: Patient tolerated treatment well Patient left: with call bell/phone within reach;in bed;in CPM     Time: [id number] PT Time Calculation (min) (ACUTE ONLY): 28 min  Charges:   $Therapeutic Exercise: 8-22 mins $Therapeutic Activity: 8-22 mins  G Codes:      Salina April, PTA Pager: [phone]   09/22/2015, 11:30 AM

## 2015-09-22 NOTE — Progress Notes (Signed)
Orthopedic Tech Progress Note Patient Details:  [name] 1945-07-20 [id number]  Patient ID: [name], female   DOB: [birth year], 70 y.o.   MRN: [id number] Pt wants to wait till after pain meds to get in cpm.   [name] 09/22/2015, 5:57 AM

## 2015-09-22 NOTE — Progress Notes (Signed)
Pt discharge education and instructions completed with pt and spouse at bedside; both voices understanding and denies any questions. Pt IV removed; pt home DME equipments 3in1 delivered to pt at beside; pt right knee aquacel dsg remains clean, dry and intact; TEDs on; pt prescriptions handed to pt spouse by PA Elnita Maxwell. Pt discharge home with husband to transport her home. Pt declined pain medication prior to discharge when offered. Pt transported off unit via wheelchair with belongings to the side alone with spouse. Delia Heady RN

## 2015-09-22 NOTE — Discharge Summary (Signed)
Patient ID: [name] MRN: [id number] DOB/AGE: 1945-06-25 70 y.o.  Admit date: 09/20/2015 Discharge date: 09/22/2015  Admission Diagnoses:  Principal Problem:   Primary localized osteoarthritis of right knee Active Problems:   Insomnia   Essential hypertension   Hyperlipidemia   Osteopenia   Anxiety associated with depression   Discharge Diagnoses:  Same  Past Medical History:  Diagnosis Date  . Anxiety   . Depression   . Headache    SINUS  . Hyperlipidemia   . Hypertension   . Osteopenia   . Primary localized osteoarthritis of right knee     Surgeries: Procedure(s): TOTAL KNEE ARTHROPLASTY on 09/20/2015   Consultants:   Discharged Condition: Improved  Hospital Course: [name] is an 70 y.o. female who was admitted 09/20/2015 for operative treatment ofPrimary localized osteoarthritis of right knee. Patient has severe unremitting pain that affects sleep, daily activities, and work/hobbies. After pre-op clearance the patient was taken to the operating room on 09/20/2015 and underwent  Procedure(s): TOTAL KNEE ARTHROPLASTY.    Patient was given perioperative antibiotics: Anti-infectives    Start     Dose/Rate Route Frequency Ordered Stop   09/22/15 0000  cephALEXin (KEFLEX) 500 MG capsule     500 mg Oral Every 6 hours 09/22/15 0859     09/21/15 1800  cephALEXin (KEFLEX) capsule 500 mg     500 mg Oral Every 6 hours 09/21/15 1559     09/20/15 1945  vancomycin (VANCOCIN) IVPB 1000 mg/200 mL premix     1,000 mg 200 mL/hr over 60 Minutes Intravenous Every 12 hours 09/20/15 1144 09/20/15 2222   09/20/15 0745  vancomycin (VANCOCIN) IVPB 1000 mg/200 mL premix     1,000 mg 200 mL/hr over 60 Minutes Intravenous To Surgery 09/20/15 0738 09/20/15 0840   09/20/15 0700  ceFAZolin (ANCEF) IVPB 2g/100 mL premix  Status:  Discontinued     2 g 200 mL/hr over 30 Minutes Intravenous To ShortStay Surgical 09/19/15 1324 09/20/15 1143       Patient was given sequential compression  devices, early ambulation, and chemoprophylaxis to prevent DVT.  Patient benefited maximally from hospital stay and there were no complications.    Recent vital signs: Patient Vitals for the past 24 hrs:  BP Temp Temp src Pulse Resp SpO2  09/22/15 0817 (!) 145/62 98.8 F (37.1 C) Oral 90 18 96 %  09/22/15 0400 (!) 164/65 - - 92 - 98 %  09/21/15 2030 (!) 157/68 98.5 F (36.9 C) Oral (!) 110 - 98 %  09/21/15 1400 (!) 148/57 99.1 F (37.3 C) - 93 16 94 %     Recent laboratory studies:  Recent Labs  09/21/15 0326 09/22/15 0459  WBC 15.4* 15.5*  HGB 10.1* 11.2*  HCT 32.5* 36.0  PLT 385 319  NA 139 142  K 4.2 4.1  CL 106 103  CO2 24 26  BUN 15 22*  CREATININE 0.89 0.81  GLUCOSE 143* 138*  CALCIUM 8.5* 8.7*     Discharge Medications:     Medication List    STOP taking these medications   aspirin 81 MG tablet   HYDROcodone-acetaminophen 5-325 MG tablet Commonly known as:  NORCO/VICODIN     TAKE these medications   amLODipine-benazepril 5-20 MG capsule Commonly known as:  LOTREL   atorvastatin 20 MG tablet Commonly known as:  LIPITOR Take 20 mg by mouth daily.   cephALEXin 500 MG capsule Commonly known as:  KEFLEX Take 1 capsule (500 mg total) by mouth every  6 (six) hours.   clonazePAM 0.5 MG tablet Commonly known as:  KLONOPIN Take 1 tablet (0.5 mg total) by mouth 2 (two) times daily as needed for anxiety. DO NOT take with Temazepam   docusate sodium 100 MG capsule Commonly known as:  COLACE 1 tab 2 times a day while on narcotics.  STOOL SOFTENER   enoxaparin 30 MG/0.3ML injection Commonly known as:  LOVENOX 1 injection sub q every 12 hrs at 9 am and 9 pm to prevent blood clots   fluticasone 50 MCG/ACT nasal spray Commonly known as:  FLONASE Place into both nostrils daily.   multivitamin tablet Take 1 tablet by mouth daily.   oxyCODONE 5 MG immediate release tablet Commonly known as:  Oxy IR/ROXICODONE 1-2 tablets every 4-6 hrs as needed for  pain   polyethylene glycol packet Commonly known as:  MIRALAX / GLYCOLAX 17grams in 6 oz of water twice a day until bowel movement.  LAXITIVE.  Restart if two days since last bowel movement   predniSONE 5 MG tablet Commonly known as:  DELTASONE Take 5 mg by mouth daily.   raloxifene 60 MG tablet Commonly known as:  EVISTA Take 1 tablet (60 mg total) by mouth daily.   temazepam 30 MG capsule Commonly known as:  RESTORIL Take 1 capsule (30 mg total) by mouth at bedtime as needed for sleep.   triamterene-hydrochlorothiazide 37.5-25 MG capsule Commonly known as:  DYAZIDE Take 1 capsule by mouth daily.   venlafaxine XR 150 MG 24 hr capsule Commonly known as:  EFFEXOR-XR Take 150 mg by mouth daily with breakfast.       Diagnostic Studies: No results found.  Disposition: ED Dismiss - Diverted Elsewhere  Discharge Instructions    CPM    Complete by:  As directed   Continuous passive motion machine (CPM):      Use the CPM from 0 to 90 for 6 hours per day.       You may break it up into 2 or 3 sessions per day.      Use CPM for 2 weeks or until you are told to stop.   Call MD / Call 911    Complete by:  As directed   If you experience chest pain or shortness of breath, CALL 911 and be transported to the hospital emergency room.  If you develope a fever above 101 F, pus (white drainage) or increased drainage or redness at the wound, or calf pain, call your surgeon's office.   Change dressing    Complete by:  As directed   Change the gauze dressing daily with sterile 4 x 4 inch gauze and apply TED hose.  DO NOT REMOVE BANDAGE OVER SURGICAL INCISION.  [place] WHOLE LEG INCLUDING OVER THE WATERPROOF BANDAGE WITH SOAP AND WATER EVERY DAY.   Constipation Prevention    Complete by:  As directed   Drink plenty of fluids.  Prune juice may be helpful.  You may use a stool softener, such as Colace (over the counter) 100 mg twice a day.  Use MiraLax (over the counter) for constipation as needed.    Diet - low sodium heart healthy    Complete by:  As directed   Discharge instructions    Complete by:  As directed   INSTRUCTIONS AFTER JOINT REPLACEMENT   Remove items at home which could result in a fall. This includes throw rugs or furniture in walking pathways ICE to the affected joint every three hours while awake  for 30 minutes at a time, for at least the first 3-5 days, and then as needed for pain and swelling.  Continue to use ice for pain and swelling. You may notice swelling that will progress down to the foot and ankle.  This is normal after surgery.  Elevate your leg when you are not up walking on it.   Continue to use the breathing machine you got in the hospital (incentive spirometer) which will help keep your temperature down.  It is common for your temperature to cycle up and down following surgery, especially at night when you are not up moving around and exerting yourself.  The breathing machine keeps your lungs expanded and your temperature down.   DIET:  As you were doing prior to hospitalization, we recommend a well-balanced diet.  DRESSING / WOUND CARE / SHOWERING  Keep the surgical dressing until follow up.  The dressing is water proof, so you can shower without any extra covering.  IF THE DRESSING FALLS OFF or the wound gets wet inside, change the dressing with sterile gauze.  Please use good hand washing techniques before changing the dressing.  Do not use any lotions or creams on the incision until instructed by your surgeon.    ACTIVITY  Increase activity slowly as tolerated, but follow the weight bearing instructions below.   No driving for 6 weeks or until further direction given by your physician.  You cannot drive while taking narcotics.  No lifting or carrying greater than 10 lbs. until further directed by your surgeon. Avoid periods of inactivity such as sitting longer than an hour when not asleep. This helps prevent blood clots.  You may return to work once  you are authorized by your doctor.     WEIGHT BEARING   Weight bearing as tolerated with assist device (walker, cane, etc) as directed, use it as long as suggested by your surgeon or therapist, typically at least 2-3 weeks.   EXERCISES  Results after joint replacement surgery are often greatly improved when you follow the exercise, range of motion and muscle strengthening exercises prescribed by your doctor. Safety measures are also important to protect the joint from further injury. Any time any of these exercises cause you to have increased pain or swelling, decrease what you are doing until you are comfortable again and then slowly increase them. If you have problems or questions, call your caregiver or physical therapist for advice.   Rehabilitation is important following a joint replacement. After just a few days of immobilization, the muscles of the leg can become weakened and shrink (atrophy).  These exercises are designed to build up the tone and strength of the thigh and leg muscles and to improve motion. Often times heat used for twenty to thirty minutes before working out will loosen up your tissues and help with improving the range of motion but do not use heat for the first two weeks following surgery (sometimes heat can increase post-operative swelling).   These exercises can be done on a training (exercise) mat, on the floor, on a table or on a bed. Use whatever works the best and is most comfortable for you.    Use music or television while you are exercising so that the exercises are a pleasant break in your day. This will make your life better with the exercises acting as a break in your routine that you can look forward to.   Perform all exercises about fifteen times, three times per day  or as directed.  You should exercise both the operative leg and the other leg as well.   Exercises include:  Quad Sets - Tighten up the muscle on the front of the thigh (Quad) and hold for 5-10  seconds.   Straight Leg Raises - With your knee straight (if you were given a brace, keep it on), lift the leg to 60 degrees, hold for 3 seconds, and slowly lower the leg.  Perform this exercise against resistance later as your leg gets stronger.  Leg Slides: Lying on your back, slowly slide your foot toward your buttocks, bending your knee up off the floor (only go as far as is comfortable). Then slowly slide your foot back down until your leg is flat on the floor again.  Angel Wings: Lying on your back spread your legs to the side as far apart as you can without causing discomfort.  Hamstring Strength:  Lying on your back, push your heel against the floor with your leg straight by tightening up the muscles of your buttocks.  Repeat, but this time bend your knee to a comfortable angle, and push your heel against the floor.  You may put a pillow under the heel to make it more comfortable if necessary.   A rehabilitation program following joint replacement surgery can speed recovery and prevent re-injury in the future due to weakened muscles. Contact your doctor or a physical therapist for more information on knee rehabilitation.    CONSTIPATION  Constipation is defined medically as fewer than three stools per week and severe constipation as less than one stool per week.  Even if you have a regular bowel pattern at home, your normal regimen is likely to be disrupted due to multiple reasons following surgery.  Combination of anesthesia, postoperative narcotics, change in appetite and fluid intake all can affect your bowels.   YOU MUST use at least one of the following options; they are listed in order of increasing strength to get the job done.  They are all available over the counter, and you may need to use some, POSSIBLY even all of these options:    Drink plenty of fluids (prune juice may be helpful) and high fiber foods Colace 100 mg by mouth twice a day  Senokot for constipation as directed and  as needed Dulcolax (bisacodyl), take with full glass of water  Miralax (polyethylene glycol) once or twice a day as needed.  If you have tried all these things and are unable to have a bowel movement in the first 3-4 days after surgery call either your surgeon or your primary doctor.    If you experience loose stools or diarrhea, hold the medications until you stool forms back up.  If your symptoms do not get better within 1 week or if they get worse, check with your doctor.  If you experience "the worst abdominal pain ever" or develop nausea or vomiting, please contact the office immediately for further recommendations for treatment.   ITCHING:  If you experience itching with your medications, try taking only a single pain pill, or even half a pain pill at a time.  You can also use Benadryl over the counter for itching or also to help with sleep.   TED HOSE STOCKINGS:  Use stockings on both legs until for at least 2 weeks or as directed by physician office. They may be removed at night for sleeping.  MEDICATIONS:  See your medication summary on the "After Visit Summary" that nursing  will review with you.  You may have some home medications which will be placed on hold until you complete the course of blood thinner medication.  It is important for you to complete the blood thinner medication as prescribed.  PRECAUTIONS:  If you experience chest pain or shortness of breath - call 911 immediately for transfer to the hospital emergency department.   If you develop a fever greater that 101 F, purulent drainage from wound, increased redness or drainage from wound, foul odor from the wound/dressing, or calf pain - CONTACT YOUR SURGEON.                                                   FOLLOW-UP APPOINTMENTS:  If you do not already have a post-op appointment, please call the office for an appointment to be seen by your surgeon.  Guidelines for how soon to be seen are listed in your "After Visit Summary",  but are typically between 1-4 weeks after surgery.  OTHER INSTRUCTIONS:   Knee Replacement:  Do not place pillow under knee, focus on keeping the knee straight while resting. CPM instructions: 0-90 degrees, 2 hours in the morning, 2 hours in the afternoon, and 2 hours in the evening. Place foam block, curve side up under heel at all times except when in CPM or when walking.  DO NOT modify, tear, cut, or change the foam block in any way.  MAKE SURE YOU:  Understand these instructions.  Get help right away if you are not doing well or get worse.    Thank you for letting us be a part of your medical care team.  It is a privilege we respect greatly.  We hope these instructions will help you stay on track for a fast and full recovery!   Do not put a pillow under the knee. Place it under the heel.    Complete by:  As directed   Place gray foam block, curve side up under heel at all times except when in CPM or when walking.  DO NOT modify, tear, cut, or change in any way the gray foam block.   Increase activity slowly as tolerated    Complete by:  As directed   Patient may shower    Complete by:  As directed   Aquacel dressing is water proof    Wash over it and the whole leg with soap and water at the end of your shower   TED hose    Complete by:  As directed   Use stockings (TED hose) for 2 weeks on both leg(s).  You may remove them at night for sleeping.      Follow-up Information    Lorn Junes, MD Follow up on 10/04/2015.   Specialty:  Orthopedic Surgery Why:  appt time 2:15 pm Contact information: [place] [address] [phone]            Signed: Linda Hedges 09/22/2015, 9:09 AM

## 2015-09-22 NOTE — Care Management Note (Signed)
Case Management Note  Patient Details  Name: [name] MRN: [id number] Date of Birth: 1945/04/12  Subjective/Objective:      Right total knee arthroplasty             Action/Plan: Discharge Planning: AVS reviewed:  NCM spoke to pt. Pt preoperatively arranged with [facility] for [facility]. Pt agreeable to [facility] for [facility]. Requesting 3n1 for home. Contacted [facility] DME rep for 3n1. Has RW at home. [employer] has delivered CPM to home. [facility] aware of scheduled dc home with [facility] RN/PT. Pt states she does know how to administer Lovenox injections.    Expected Discharge Date:  09/22/2015              Expected Discharge Plan:  [place]  In-House Referral:  NA  Discharge planning Services  CM Consult  Post Acute Care Choice:  Home Health Choice offered to:  Patient  DME Arranged:  3-N-1 DME Agency:  [place]:  PT, OT [place] Agency:  [facility] (now [facility])  Status of Service:  Completed, signed off  If discussed at [place] of Stay Meetings, dates discussed:    Additional Comments:  Erenest Rasher, RN 09/22/2015, 1:05 PM

## 2015-09-22 NOTE — Progress Notes (Signed)
Physical Therapy Treatment Patient Details Name: Aneisa Blanche MRN: [id number] DOB: 04-Jun-1945 Today's Date: 09/22/2015    History of Present Illness Pt admitted 8/7 for elective R TKA.    PT Comments    Pt continues to progress toward PT goals. Tolerated gait training and therex well with no increase in pain. Continue to progress as tolerated with anticipated d/c home with HHPT.   Follow Up Recommendations  Home health PT;Supervision/Assistance - 24 hour     Equipment Recommendations  3in1 (PT)    Recommendations for Other Services       Precautions / Restrictions Precautions Precautions: Knee Precaution Booklet Issued: Yes (comment) Restrictions Weight Bearing Restrictions: Yes RLE Weight Bearing: Weight bearing as tolerated    Mobility  Bed Mobility Overal bed mobility: Needs Assistance Bed Mobility: Sit to Supine       Sit to supine: Supervision   General bed mobility comments: in chair upon arrival; supervision for return to supine  Transfers Overall transfer level: Needs assistance Equipment used: Rolling walker (2 wheeled) Transfers: Sit to/from Stand Sit to Stand: Supervision         General transfer comment: safe hand placement and technique  Ambulation/Gait Ambulation/Gait assistance: Supervision Ambulation Distance (Feet): 150 Feet Assistive device: Rolling walker (2 wheeled) Gait Pattern/deviations: Step-through pattern;Decreased stride length Gait velocity: decreased   General Gait Details: improved heel strike, knee extension, and WB this session; cues for increased R knee flexion during swing phase with some improvement noted   Stairs            Wheelchair Mobility    Modified Rankin (Stroke Patients Only)       Balance     Sitting balance-Leahy Scale: Good       Standing balance-Leahy Scale: Fair                      Cognition Arousal/Alertness: Awake/alert Behavior During Therapy: Anxious  (nervous) Overall Cognitive Status: Within Functional Limits for tasks assessed                      Exercises Total Joint Exercises Quad Sets: AROM;Right;10 reps;Seated Heel Slides: AROM;AAROM;Right;10 reps;Seated Straight Leg Raises: AROM;Right;10 reps;Seated Goniometric ROM: 0-75    General Comments        Pertinent Vitals/Pain Pain Assessment: 0-10 Pain Score: 4  Pain Location: R knee with mobility Pain Descriptors / Indicators: Sore Pain Intervention(s): Limited activity within patient's tolerance;Monitored during session;Repositioned;Patient requesting pain meds-RN notified;Premedicated before session    Home Living                      Prior Function            PT Goals (current goals can now be found in the care plan section) Acute Rehab PT Goals Patient Stated Goal: home PT Goal Formulation: With patient Time For Goal Achievement: 09/27/15 Potential to Achieve Goals: Good Progress towards PT goals: Progressing toward goals    Frequency  7X/week    PT Plan Current plan remains appropriate    Co-evaluation             End of Session Equipment Utilized During Treatment: Gait belt Activity Tolerance: Patient tolerated treatment well Patient left: with call bell/phone within reach;in bed;in CPM     Time: [id number] PT Time Calculation (min) (ACUTE ONLY): 26 min  Charges:  $Gait Training: 8-22 mins $Therapeutic Exercise: 8-22 mins  G Codes:      Salina April, PTA Pager: [phone]   09/22/2015, 9:56 AM

## 2015-09-24 DIAGNOSIS — M8580 Other specified disorders of bone density and structure, unspecified site: Secondary | ICD-10-CM | POA: Diagnosis not present

## 2015-09-24 DIAGNOSIS — I10 Essential (primary) hypertension: Secondary | ICD-10-CM | POA: Diagnosis not present

## 2015-09-24 DIAGNOSIS — Z471 Aftercare following joint replacement surgery: Secondary | ICD-10-CM | POA: Diagnosis not present

## 2015-09-24 DIAGNOSIS — F418 Other specified anxiety disorders: Secondary | ICD-10-CM | POA: Diagnosis not present

## 2015-09-24 DIAGNOSIS — Z96651 Presence of right artificial knee joint: Secondary | ICD-10-CM | POA: Diagnosis not present

## 2015-09-27 DIAGNOSIS — I10 Essential (primary) hypertension: Secondary | ICD-10-CM | POA: Diagnosis not present

## 2015-09-27 DIAGNOSIS — F418 Other specified anxiety disorders: Secondary | ICD-10-CM | POA: Diagnosis not present

## 2015-09-27 DIAGNOSIS — Z471 Aftercare following joint replacement surgery: Secondary | ICD-10-CM | POA: Diagnosis not present

## 2015-09-27 DIAGNOSIS — M8580 Other specified disorders of bone density and structure, unspecified site: Secondary | ICD-10-CM | POA: Diagnosis not present

## 2015-09-27 DIAGNOSIS — Z96651 Presence of right artificial knee joint: Secondary | ICD-10-CM | POA: Diagnosis not present

## 2015-09-29 ENCOUNTER — Other Ambulatory Visit: Payer: Self-pay | Admitting: Osteopathic Medicine

## 2015-09-29 DIAGNOSIS — G4700 Insomnia, unspecified: Secondary | ICD-10-CM

## 2015-09-29 DIAGNOSIS — Z471 Aftercare following joint replacement surgery: Secondary | ICD-10-CM | POA: Diagnosis not present

## 2015-09-29 DIAGNOSIS — I10 Essential (primary) hypertension: Secondary | ICD-10-CM | POA: Diagnosis not present

## 2015-09-29 DIAGNOSIS — M8580 Other specified disorders of bone density and structure, unspecified site: Secondary | ICD-10-CM | POA: Diagnosis not present

## 2015-09-29 DIAGNOSIS — Z96651 Presence of right artificial knee joint: Secondary | ICD-10-CM | POA: Diagnosis not present

## 2015-09-29 DIAGNOSIS — F418 Other specified anxiety disorders: Secondary | ICD-10-CM | POA: Diagnosis not present

## 2015-09-30 DIAGNOSIS — Z471 Aftercare following joint replacement surgery: Secondary | ICD-10-CM | POA: Diagnosis not present

## 2015-09-30 DIAGNOSIS — Z96651 Presence of right artificial knee joint: Secondary | ICD-10-CM | POA: Diagnosis not present

## 2015-09-30 DIAGNOSIS — M8580 Other specified disorders of bone density and structure, unspecified site: Secondary | ICD-10-CM | POA: Diagnosis not present

## 2015-09-30 DIAGNOSIS — I10 Essential (primary) hypertension: Secondary | ICD-10-CM | POA: Diagnosis not present

## 2015-09-30 DIAGNOSIS — F418 Other specified anxiety disorders: Secondary | ICD-10-CM | POA: Diagnosis not present

## 2015-09-30 MED ORDER — TEMAZEPAM 30 MG PO CAPS: 30.0000 mg | ORAL_CAPSULE | Freq: Every evening | ORAL | 0 refills | Status: DC | PRN

## 2015-10-01 DIAGNOSIS — I10 Essential (primary) hypertension: Secondary | ICD-10-CM | POA: Diagnosis not present

## 2015-10-01 DIAGNOSIS — Z471 Aftercare following joint replacement surgery: Secondary | ICD-10-CM | POA: Diagnosis not present

## 2015-10-01 DIAGNOSIS — Z96651 Presence of right artificial knee joint: Secondary | ICD-10-CM | POA: Diagnosis not present

## 2015-10-01 DIAGNOSIS — M8580 Other specified disorders of bone density and structure, unspecified site: Secondary | ICD-10-CM | POA: Diagnosis not present

## 2015-10-01 DIAGNOSIS — F418 Other specified anxiety disorders: Secondary | ICD-10-CM | POA: Diagnosis not present

## 2015-10-04 DIAGNOSIS — M25561 Pain in right knee: Secondary | ICD-10-CM | POA: Diagnosis not present

## 2015-10-04 DIAGNOSIS — M25661 Stiffness of right knee, not elsewhere classified: Secondary | ICD-10-CM | POA: Diagnosis not present

## 2015-10-04 DIAGNOSIS — M6281 Muscle weakness (generalized): Secondary | ICD-10-CM | POA: Diagnosis not present

## 2015-10-04 DIAGNOSIS — R262 Difficulty in walking, not elsewhere classified: Secondary | ICD-10-CM | POA: Diagnosis not present

## 2015-10-04 DIAGNOSIS — M1711 Unilateral primary osteoarthritis, right knee: Secondary | ICD-10-CM | POA: Diagnosis not present

## 2015-10-06 ENCOUNTER — Encounter: Payer: Self-pay | Admitting: Osteopathic Medicine

## 2015-10-06 DIAGNOSIS — M25561 Pain in right knee: Secondary | ICD-10-CM | POA: Diagnosis not present

## 2015-10-06 DIAGNOSIS — M6281 Muscle weakness (generalized): Secondary | ICD-10-CM | POA: Diagnosis not present

## 2015-10-06 DIAGNOSIS — M25661 Stiffness of right knee, not elsewhere classified: Secondary | ICD-10-CM | POA: Diagnosis not present

## 2015-10-06 DIAGNOSIS — R262 Difficulty in walking, not elsewhere classified: Secondary | ICD-10-CM | POA: Diagnosis not present

## 2015-10-08 DIAGNOSIS — M25661 Stiffness of right knee, not elsewhere classified: Secondary | ICD-10-CM | POA: Diagnosis not present

## 2015-10-08 DIAGNOSIS — M6281 Muscle weakness (generalized): Secondary | ICD-10-CM | POA: Diagnosis not present

## 2015-10-08 DIAGNOSIS — R262 Difficulty in walking, not elsewhere classified: Secondary | ICD-10-CM | POA: Diagnosis not present

## 2015-10-08 DIAGNOSIS — M25561 Pain in right knee: Secondary | ICD-10-CM | POA: Diagnosis not present

## 2015-10-11 DIAGNOSIS — M6281 Muscle weakness (generalized): Secondary | ICD-10-CM | POA: Diagnosis not present

## 2015-10-11 DIAGNOSIS — M25561 Pain in right knee: Secondary | ICD-10-CM | POA: Diagnosis not present

## 2015-10-11 DIAGNOSIS — R262 Difficulty in walking, not elsewhere classified: Secondary | ICD-10-CM | POA: Diagnosis not present

## 2015-10-11 DIAGNOSIS — M25661 Stiffness of right knee, not elsewhere classified: Secondary | ICD-10-CM | POA: Diagnosis not present

## 2015-10-13 DIAGNOSIS — M1711 Unilateral primary osteoarthritis, right knee: Secondary | ICD-10-CM | POA: Diagnosis not present

## 2015-10-13 DIAGNOSIS — M6281 Muscle weakness (generalized): Secondary | ICD-10-CM | POA: Diagnosis not present

## 2015-10-13 DIAGNOSIS — M25661 Stiffness of right knee, not elsewhere classified: Secondary | ICD-10-CM | POA: Diagnosis not present

## 2015-10-13 DIAGNOSIS — M25561 Pain in right knee: Secondary | ICD-10-CM | POA: Diagnosis not present

## 2015-10-15 DIAGNOSIS — M25661 Stiffness of right knee, not elsewhere classified: Secondary | ICD-10-CM | POA: Diagnosis not present

## 2015-10-15 DIAGNOSIS — M25561 Pain in right knee: Secondary | ICD-10-CM | POA: Diagnosis not present

## 2015-10-15 DIAGNOSIS — R262 Difficulty in walking, not elsewhere classified: Secondary | ICD-10-CM | POA: Diagnosis not present

## 2015-10-15 DIAGNOSIS — M6281 Muscle weakness (generalized): Secondary | ICD-10-CM | POA: Diagnosis not present

## 2015-10-19 ENCOUNTER — Telehealth: Payer: Self-pay | Admitting: Osteopathic Medicine

## 2015-10-19 DIAGNOSIS — M25561 Pain in right knee: Secondary | ICD-10-CM | POA: Diagnosis not present

## 2015-10-19 DIAGNOSIS — R262 Difficulty in walking, not elsewhere classified: Secondary | ICD-10-CM | POA: Diagnosis not present

## 2015-10-19 DIAGNOSIS — M6281 Muscle weakness (generalized): Secondary | ICD-10-CM | POA: Diagnosis not present

## 2015-10-19 DIAGNOSIS — M25661 Stiffness of right knee, not elsewhere classified: Secondary | ICD-10-CM | POA: Diagnosis not present

## 2015-10-19 DIAGNOSIS — I10 Essential (primary) hypertension: Secondary | ICD-10-CM

## 2015-10-19 MED ORDER — AMLODIPINE BESY-BENAZEPRIL HCL 5-20 MG PO CAPS: 1.0000 | ORAL_CAPSULE | Freq: Every day | ORAL | 1 refills | Status: DC

## 2015-10-19 NOTE — Telephone Encounter (Signed)
Signed Rx

## 2015-10-19 NOTE — Telephone Encounter (Signed)
BP pl last visit, ok to refill

## 2015-10-26 DIAGNOSIS — M25561 Pain in right knee: Secondary | ICD-10-CM | POA: Diagnosis not present

## 2015-10-26 DIAGNOSIS — M25661 Stiffness of right knee, not elsewhere classified: Secondary | ICD-10-CM | POA: Diagnosis not present

## 2015-10-26 DIAGNOSIS — M6281 Muscle weakness (generalized): Secondary | ICD-10-CM | POA: Diagnosis not present

## 2015-10-26 DIAGNOSIS — R262 Difficulty in walking, not elsewhere classified: Secondary | ICD-10-CM | POA: Diagnosis not present

## 2015-10-28 DIAGNOSIS — M6281 Muscle weakness (generalized): Secondary | ICD-10-CM | POA: Diagnosis not present

## 2015-10-28 DIAGNOSIS — M25561 Pain in right knee: Secondary | ICD-10-CM | POA: Diagnosis not present

## 2015-10-28 DIAGNOSIS — M25661 Stiffness of right knee, not elsewhere classified: Secondary | ICD-10-CM | POA: Diagnosis not present

## 2015-10-28 DIAGNOSIS — R262 Difficulty in walking, not elsewhere classified: Secondary | ICD-10-CM | POA: Diagnosis not present

## 2015-10-31 ENCOUNTER — Other Ambulatory Visit: Payer: Self-pay | Admitting: Osteopathic Medicine

## 2015-10-31 DIAGNOSIS — G4700 Insomnia, unspecified: Secondary | ICD-10-CM

## 2015-11-01 ENCOUNTER — Other Ambulatory Visit: Payer: Self-pay | Admitting: Osteopathic Medicine

## 2015-11-01 DIAGNOSIS — G4700 Insomnia, unspecified: Secondary | ICD-10-CM

## 2015-11-01 MED ORDER — TEMAZEPAM 30 MG PO CAPS: 30.0000 mg | ORAL_CAPSULE | Freq: Every evening | ORAL | 0 refills | Status: DC | PRN

## 2015-11-02 DIAGNOSIS — M25661 Stiffness of right knee, not elsewhere classified: Secondary | ICD-10-CM | POA: Diagnosis not present

## 2015-11-02 DIAGNOSIS — M6281 Muscle weakness (generalized): Secondary | ICD-10-CM | POA: Diagnosis not present

## 2015-11-02 DIAGNOSIS — M25561 Pain in right knee: Secondary | ICD-10-CM | POA: Diagnosis not present

## 2015-11-02 DIAGNOSIS — R262 Difficulty in walking, not elsewhere classified: Secondary | ICD-10-CM | POA: Diagnosis not present

## 2015-11-04 DIAGNOSIS — R262 Difficulty in walking, not elsewhere classified: Secondary | ICD-10-CM | POA: Diagnosis not present

## 2015-11-04 DIAGNOSIS — M6281 Muscle weakness (generalized): Secondary | ICD-10-CM | POA: Diagnosis not present

## 2015-11-04 DIAGNOSIS — M25561 Pain in right knee: Secondary | ICD-10-CM | POA: Diagnosis not present

## 2015-11-04 DIAGNOSIS — M25661 Stiffness of right knee, not elsewhere classified: Secondary | ICD-10-CM | POA: Diagnosis not present

## 2015-11-08 ENCOUNTER — Other Ambulatory Visit: Payer: Self-pay | Admitting: Osteopathic Medicine

## 2015-11-08 DIAGNOSIS — M25561 Pain in right knee: Secondary | ICD-10-CM | POA: Diagnosis not present

## 2015-11-08 DIAGNOSIS — F418 Other specified anxiety disorders: Secondary | ICD-10-CM

## 2015-11-08 MED ORDER — CLONAZEPAM 0.5 MG PO TABS: 0.5000 mg | ORAL_TABLET | Freq: Two times a day (BID) | ORAL | 0 refills | Status: DC | PRN

## 2015-11-09 ENCOUNTER — Encounter: Payer: Self-pay | Admitting: Osteopathic Medicine

## 2015-11-11 DIAGNOSIS — M6281 Muscle weakness (generalized): Secondary | ICD-10-CM | POA: Diagnosis not present

## 2015-11-11 DIAGNOSIS — M25561 Pain in right knee: Secondary | ICD-10-CM | POA: Diagnosis not present

## 2015-11-11 DIAGNOSIS — M25661 Stiffness of right knee, not elsewhere classified: Secondary | ICD-10-CM | POA: Diagnosis not present

## 2015-11-11 DIAGNOSIS — R262 Difficulty in walking, not elsewhere classified: Secondary | ICD-10-CM | POA: Diagnosis not present

## 2015-11-15 DIAGNOSIS — M1711 Unilateral primary osteoarthritis, right knee: Secondary | ICD-10-CM | POA: Diagnosis not present

## 2015-11-16 DIAGNOSIS — M25661 Stiffness of right knee, not elsewhere classified: Secondary | ICD-10-CM | POA: Diagnosis not present

## 2015-11-16 DIAGNOSIS — M6281 Muscle weakness (generalized): Secondary | ICD-10-CM | POA: Diagnosis not present

## 2015-11-16 DIAGNOSIS — R262 Difficulty in walking, not elsewhere classified: Secondary | ICD-10-CM | POA: Diagnosis not present

## 2015-11-16 DIAGNOSIS — M25561 Pain in right knee: Secondary | ICD-10-CM | POA: Diagnosis not present

## 2015-11-22 DIAGNOSIS — M25561 Pain in right knee: Secondary | ICD-10-CM | POA: Diagnosis not present

## 2015-11-29 ENCOUNTER — Other Ambulatory Visit: Payer: Self-pay | Admitting: Osteopathic Medicine

## 2015-11-29 DIAGNOSIS — G4700 Insomnia, unspecified: Secondary | ICD-10-CM

## 2015-11-30 ENCOUNTER — Other Ambulatory Visit: Payer: Self-pay | Admitting: Osteopathic Medicine

## 2015-11-30 DIAGNOSIS — G4700 Insomnia, unspecified: Secondary | ICD-10-CM

## 2015-12-01 ENCOUNTER — Ambulatory Visit: Payer: Self-pay | Admitting: Osteopathic Medicine

## 2015-12-01 MED ORDER — TEMAZEPAM 30 MG PO CAPS
30.0000 mg | ORAL_CAPSULE | Freq: Every evening | ORAL | 0 refills | Status: DC | PRN
Start: 2015-12-01 — End: 2015-12-07

## 2015-12-06 ENCOUNTER — Ambulatory Visit: Payer: Medicare Other | Admitting: Osteopathic Medicine

## 2015-12-07 ENCOUNTER — Encounter: Payer: Self-pay | Admitting: Osteopathic Medicine

## 2015-12-07 ENCOUNTER — Ambulatory Visit (INDEPENDENT_AMBULATORY_CARE_PROVIDER_SITE_OTHER): Payer: Medicare Other | Admitting: Osteopathic Medicine

## 2015-12-07 VITALS — BP 135/86 | HR 100 | Wt 201.0 lb

## 2015-12-07 DIAGNOSIS — Z23 Encounter for immunization: Secondary | ICD-10-CM

## 2015-12-07 DIAGNOSIS — I10 Essential (primary) hypertension: Secondary | ICD-10-CM

## 2015-12-07 DIAGNOSIS — F418 Other specified anxiety disorders: Secondary | ICD-10-CM

## 2015-12-07 DIAGNOSIS — M8580 Other specified disorders of bone density and structure, unspecified site: Secondary | ICD-10-CM

## 2015-12-07 DIAGNOSIS — IMO0001 Reserved for inherently not codable concepts without codable children: Secondary | ICD-10-CM | POA: Insufficient documentation

## 2015-12-07 DIAGNOSIS — F605 Obsessive-compulsive personality disorder: Secondary | ICD-10-CM

## 2015-12-07 DIAGNOSIS — G4700 Insomnia, unspecified: Secondary | ICD-10-CM | POA: Diagnosis not present

## 2015-12-07 LAB — CBC WITH DIFFERENTIAL/PLATELET
BASOS PCT: 0 %
Basophils Absolute: 0 cells/uL (ref 0–200)
Eosinophils Absolute: 89 cells/uL (ref 15–500)
Eosinophils Relative: 1 %
HCT: 37.3 % (ref 35.0–45.0)
Hemoglobin: 12.5 g/dL (ref 11.7–15.5)
LYMPHS PCT: 28 %
Lymphs Abs: 2492 cells/uL (ref 850–3900)
MCH: 27.1 pg (ref 27.0–33.0)
MCHC: 33.5 g/dL (ref 32.0–36.0)
MCV: 80.7 fL (ref 80.0–100.0)
MONO ABS: 534 {cells}/uL (ref 200–950)
MONOS PCT: 6 %
MPV: 9.1 fL (ref 7.5–12.5)
NEUTROS ABS: 5785 {cells}/uL (ref 1500–7800)
Neutrophils Relative %: 65 %
PLATELETS: 501 10*3/uL — AB (ref 140–400)
RBC: 4.62 MIL/uL (ref 3.80–5.10)
RDW: 15.3 % — AB (ref 11.0–15.0)
WBC: 8.9 10*3/uL (ref 3.8–10.8)

## 2015-12-07 LAB — COMPLETE METABOLIC PANEL WITH GFR
ALT: 16 U/L (ref 6–29)
AST: 21 U/L (ref 10–35)
Albumin: 4.5 g/dL (ref 3.6–5.1)
Alkaline Phosphatase: 100 U/L (ref 33–130)
BUN: 19 mg/dL (ref 7–25)
CHLORIDE: 102 mmol/L (ref 98–110)
CO2: 24 mmol/L (ref 20–31)
Calcium: 9.9 mg/dL (ref 8.6–10.4)
Creat: 0.99 mg/dL — ABNORMAL HIGH (ref 0.60–0.93)
GFR, Est African American: 66 mL/min (ref >=60)
GFR, Est Non African American: 58 mL/min — ABNORMAL LOW (ref >=60)
GLUCOSE: 105 mg/dL — AB (ref 65–99)
Potassium: 4.5 mmol/L (ref 3.5–5.3)
Sodium: 140 mmol/L (ref 135–146)
TOTAL PROTEIN: 7.3 g/dL (ref 6.1–8.1)
Total Bilirubin: 0.3 mg/dL (ref 0.2–1.2)

## 2015-12-07 LAB — TSH: TSH: 1.93 mIU/L

## 2015-12-07 MED ORDER — SUVOREXANT 20 MG PO TABS: 20.0000 mg | ORAL_TABLET | Freq: Every day | ORAL | 0 refills | Status: DC

## 2015-12-07 MED ORDER — SUVOREXANT 10 MG PO TABS: 10.0000 mg | ORAL_TABLET | Freq: Every day | ORAL | 0 refills | Status: DC

## 2015-12-07 NOTE — Patient Instructions (Addendum)
Hold temazepam for now.  Can continue clonazepam.  Trying new medicine for sleep: Belsomra (suvorexant)  Start at 10 mg at night for at least 3 - 5 nights  If not sleeping well, can increase to 20 mg   If not sleeping well after 1 - 2 weeks on the Belsomra, call me and we can try alternative medication. The next step after that would be referral to sleep medicine if a new prescription is still not helpful for you in addition to counseling.   Referral placed for counseling for insomnia as well as obsessive-compulsive

## 2015-12-07 NOTE — Progress Notes (Signed)
HPI: [name] is a 70 y.o. female  who presents to [facility] today, 12/07/15,  for chief complaint of:  Chief Complaint  Patient presents with  . Sleeping Problem    Insomnia:  Long-standing insomnia, ongoing since patient was in her 76s. Sleep onset as well as maintenance issues. Stress exacerbates these. She gets obsessive thoughts also about her insomnia to where she worries that she doesn't sleep, so she sleeps even worse. Has been hospitalized for this issue in the 1980s/1990s. At that time was placed on temazepam and clonazepam. Previously following with psychiatry. Now seems that the temazepam isn't working as well as it used to be.  Depression:  Previously on Prozac, then stopped, then was on  (+)FH depression and mood problems. Preivously on she thinks Mitrazapine but gained weight on this.   HTN:  Currently taking one half tablet of the triamterene/hydrochlorothiazide, our list appears inaccurate. Blood pressure well controlled today, no chest pain, pressure, shortness of breath.  Past medical, surgical, social and family history reviewed: Past Medical History:  Diagnosis Date  . Anxiety   . Depression   . Headache    SINUS  . Hyperlipidemia   . Hypertension   . Osteopenia   . Primary localized osteoarthritis of right knee    Past Surgical History:  Procedure Laterality Date  . BREAST LUMPECTOMY Right 1997   Done in [place]  . CARPAL TUNNEL RELEASE Right 2015   Ortho in [place]  . LASIK    . MENISCUS REPAIR Right 2011   Orthopedic in [place]  . TOTAL KNEE ARTHROPLASTY Right 09/20/2015  . TOTAL KNEE ARTHROPLASTY Right 09/20/2015   Procedure: TOTAL KNEE ARTHROPLASTY;  Surgeon: Elsie Saas, MD;  Location: [facility];  Service: Orthopedics;  Laterality: Right;  . Trigger Thumb Right 2015   Done in [facility] at the same time as the carpal tunnel release  . TUBAL LIGATION  1980   Social History  Substance Use Topics  .  Smoking status: Never Smoker  . Smokeless tobacco: Never Used  . Alcohol use Yes     Comment: 1 glass a month   Family History  Problem Relation Age of Onset  . Heart failure Mother   . Heart attack Mother   . Alcohol abuse Maternal Aunt   . Heart attack Father   . Irregular heart beat Sister   . Hypertension Sister   . Heart disease Brother      Current medication list and allergy/intolerance information reviewed:   Current Outpatient Prescriptions on File Prior to Visit  Medication Sig Dispense Refill  . amLODipine-benazepril (LOTREL) 5-20 MG capsule Take 1 capsule by mouth daily. 90 capsule 1  . atorvastatin (LIPITOR) 20 MG tablet Take 20 mg by mouth daily.    . cephALEXin (KEFLEX) 500 MG capsule Take 1 capsule (500 mg total) by mouth every 6 (six) hours. 40 capsule 0  . clonazePAM (KLONOPIN) 0.5 MG tablet Take 1 tablet (0.5 mg total) by mouth 2 (two) times daily as needed for anxiety. DO NOT take with Temazepam 60 tablet 0  . docusate sodium (COLACE) 100 MG capsule 1 tab 2 times a day while on narcotics.  STOOL SOFTENER 60 capsule 0  . enoxaparin (LOVENOX) 30 MG/0.3ML injection 1 injection sub q every 12 hrs at 9 am and 9 pm to prevent blood clots 30 Syringe 0  . fluticasone (FLONASE) 50 MCG/ACT nasal spray Place into both nostrils daily.    . Multiple Vitamin (MULTIVITAMIN)  tablet Take 1 tablet by mouth daily.    Marland Kitchen oxyCODONE (OXY IR/ROXICODONE) 5 MG immediate release tablet 1-2 tablets every 4-6 hrs as needed for pain 84 tablet 0  . polyethylene glycol (MIRALAX / GLYCOLAX) packet 17grams in 6 oz of water twice a day until bowel movement.  LAXITIVE.  Restart if two days since last bowel movement 14 each 0  . predniSONE (DELTASONE) 5 MG tablet Take 5 mg by mouth daily.    . raloxifene (EVISTA) 60 MG tablet Take 1 tablet (60 mg total) by mouth daily. 90 tablet 3  . temazepam (RESTORIL) 30 MG capsule Take 1 capsule (30 mg total) by mouth at bedtime as needed for sleep. 30 capsule 0   . triamterene-hydrochlorothiazide (DYAZIDE) 37.5-25 MG capsule Take 1 capsule by mouth daily.    Marland Kitchen venlafaxine XR (EFFEXOR-XR) 150 MG 24 hr capsule Take 150 mg by mouth daily with breakfast.     No current facility-administered medications on file prior to visit.    Allergies  Allergen Reactions  . Penicillins Other (See Comments)    UNSPECIFIED   . Sulfa Antibiotics Other (See Comments)    UNSPECIFIED       Review of Systems:  Constitutional: No recent illness  HEENT: No  headache, no vision change  Cardiac: No  chest pain, No  pressure, No palpitations  Respiratory:  No  shortness of breath. No  Cough  Neurologic: No  weakness, No  Dizziness  Psychiatric: +concerns with depression, +concerns with anxiety, +sleep problems  Exam:  BP 135/86   Pulse 100   Wt 201 lb (91.2 kg)   BMI 35.61 kg/m   Constitutional: VS see above. General Appearance: alert, well-developed, well-nourished, NAD  Eyes: Normal lids and conjunctive, non-icteric sclera  Ears, Nose, Mouth, Throat: MMM, Normal external inspection ears/nares/mouth/lips/gums.  Neck: No masses, trachea midline.   Respiratory: Normal respiratory effort. no wheeze, no rhonchi, no rales  Cardiovascular: S1/S2 normal, no murmur, no rub/gallop auscultated. RRR.   Musculoskeletal: Gait normal. Symmetric and independent movement of all extremities  Neurological: Normal balance/coordination. No tremor.  Skin: warm, dry, intact.   Psychiatric: Normal judgment/insight. Normal mood and affect. Oriented x3.      ASSESSMENT/PLAN: Risks versus benefits as sedatives, benzodiazepines, alternative medications as well as behavioral modifications were discussed at length with the patient. If current benzo regimen isn't working for sleep, would continue, as a p.m. during the day to prevent withdrawal symptoms, can stop the time of the pen and will initiate suvorexant as below. Consider Ambien CR or other if not helpful. May  need to consider sleep study/specialist referral given complicated history.  Insomnia, unspecified type - Plan: Suvorexant (BELSOMRA) 10 MG TABS, Suvorexant (BELSOMRA) 20 MG TABS, Ambulatory referral to Psychiatry, CBC with Differential/Platelet, COMPLETE METABOLIC PANEL WITH GFR, TSH  Obsessive-compulsive personality trait - Plan: Ambulatory referral to Psychiatry  Need for prophylactic vaccination and inoculation against influenza - Plan: Flu Vaccine QUAD 36+ mos IM  Osteopenia, unspecified location - Plan: Vitamin D (25 hydroxy)  Essential hypertension  Anxiety associated with depression    Patient Instructions  Hold temazepam for now.  Can continue clonazepam.  Trying new medicine for sleep: Belsomra (suvorexant)  Start at 10 mg at night for at least 3 - 5 nights  If not sleeping well, can increase to 20 mg   If not sleeping well after 1 - 2 weeks on the Belsomra, call me and we can try alternative medication. The next step after that would  be referral to sleep medicine if a new prescription is still not helpful for you in addition to counseling.   Referral placed for counseling for insomnia as well as obsessive-compulsive     Visit summary with medication list and pertinent instructions was printed for patient to review. All questions at time of visit were answered - patient instructed to contact office with any additional concerns. ER/RTC precautions were reviewed with the patient. Follow-up plan: Return in about 4 weeks (around 01/04/2016) for follow-up insomnia if no better.

## 2015-12-08 ENCOUNTER — Other Ambulatory Visit: Payer: Self-pay | Admitting: *Deleted

## 2015-12-08 ENCOUNTER — Ambulatory Visit: Payer: Medicare Other | Admitting: Osteopathic Medicine

## 2015-12-08 DIAGNOSIS — F418 Other specified anxiety disorders: Secondary | ICD-10-CM

## 2015-12-08 LAB — VITAMIN D 25 HYDROXY (VIT D DEFICIENCY, FRACTURES): VIT D 25 HYDROXY: 45 ng/mL (ref 30–100)

## 2015-12-08 MED ORDER — CLONAZEPAM 0.5 MG PO TABS: 0.5000 mg | ORAL_TABLET | Freq: Two times a day (BID) | ORAL | 0 refills | Status: DC | PRN

## 2015-12-09 ENCOUNTER — Encounter: Payer: Self-pay | Admitting: Osteopathic Medicine

## 2015-12-10 ENCOUNTER — Telehealth: Payer: Self-pay

## 2015-12-10 MED ORDER — ZOLPIDEM TARTRATE 5 MG PO TABS
5.0000 mg | ORAL_TABLET | Freq: Every evening | ORAL | 1 refills | Status: DC | PRN
Start: 2015-12-10 — End: 2015-12-27

## 2015-12-10 NOTE — Telephone Encounter (Signed)
Patient has been informed and Rx has been faxed to [facility]. Chenay Nesmith,CMA

## 2015-12-10 NOTE — Telephone Encounter (Signed)
Prescription printed, we can fax this over.

## 2015-12-10 NOTE — Telephone Encounter (Signed)
Patient called several times stated that the [place] is making her shaky and she is having night mares. Patient is requesting a Rx for Ambien. Please advise. Vickey Boak,CMA

## 2015-12-14 ENCOUNTER — Ambulatory Visit (INDEPENDENT_AMBULATORY_CARE_PROVIDER_SITE_OTHER): Payer: Medicare Other | Admitting: Licensed Clinical Social Worker

## 2015-12-14 DIAGNOSIS — F419 Anxiety disorder, unspecified: Secondary | ICD-10-CM | POA: Diagnosis not present

## 2015-12-14 DIAGNOSIS — F5105 Insomnia due to other mental disorder: Secondary | ICD-10-CM | POA: Diagnosis not present

## 2015-12-15 NOTE — Progress Notes (Signed)
Comprehensive Clinical Assessment (CCA) Note  12/15/2015 Hedy Siegrist [id number]  Visit Diagnosis:      ICD-9-CM ICD-10-CM   1. Hyposomnia, insomnia or sleeplessness associated with anxiety 293.84 F51.05    327.02 F41.9       CCA Part One  Part One has been completed on paper by the patient.  (See scanned document in Chart Review)  CCA Part Two A  Intake/Chief Complaint:  CCA Intake With Chief Complaint CCA Part Two Date: 12/14/15 CCA Part Two Time: 1411 Chief Complaint/Presenting Problem: Has had problems with Insomnia throughout most of her life.  Reports it has worsened in the past month and a half.  Tends to get stuck on worries about being able to fall asleep.  Patients Currently Reported Symptoms/Problems: When she doesn't sleep she feels "lousy" the next day.  Reports "It makes my depression worse...causes irritability."  Admits to vague SI at times when the insomnia is severe.  For example, thoughts like "If things get too bad there is a way out."  Denies intent or plan.  Individual's Strengths: "I'm pretty bright.  I get along with people well, although I'm kind of shy."  "I'm the one that always takes care of people."  Meticulous, organized.  "I dearly love my husband and grandchildren."  Able to admit when she needs help. Individual's Preferences: Wants to get better quality sleep and worry less about sleep Type of Services Patient Feels Are Needed: therapy Initial Clinical Notes/Concerns: Saw a therapist in the 1980s.  Not particularly helpful.  Indicated that it mostly involved exploring potential sources of trauma from her past.  Had to be hospitalized in 1990 after not being able to sleep for 4-5 days.      Mental Health Symptoms Depression:  Depression: Sleep (too much or little), Fatigue, Worthlessness, Hopelessness, Difficulty Concentrating, Increase/decrease in appetite, Irritability  Mania:  Mania: N/A  Anxiety:   Anxiety: Worrying, Tension, Sleep, Irritability,  Fatigue, Difficulty concentrating, Restlessness  Psychosis:  Psychosis: N/A  Trauma:  Trauma: N/A  Obsessions:  Obsessions: N/A  Compulsions:  Compulsions: N/A  Inattention:  Inattention: N/A  Hyperactivity/Impulsivity:  Hyperactivity/Impulsivity: N/A  Oppositional/Defiant Behaviors:  Oppositional/Defiant Behaviors: N/A  Borderline Personality:  Emotional Irregularity: N/A  Other Mood/Personality Symptoms:      Mental Status Exam Appearance and self-care  Stature:  Stature: Average  Weight:  Weight: Overweight  Clothing:  Clothing: Neat/clean  Grooming:  Grooming: Well-groomed  Cosmetic use:  Cosmetic Use: Age appropriate  Posture/gait:  Posture/Gait: Normal  Motor activity:  Motor Activity: Not Remarkable  Sensorium  Attention:  Attention: Normal  Concentration:  Concentration: Normal  Orientation:  Orientation: X5  Recall/memory:  Recall/Memory: Normal  Affect and Mood  Affect:  Affect: Appropriate  Mood:  Mood: Euthymic  Relating  Eye contact:  Eye Contact: Normal  Facial expression:  Facial Expression: Responsive  Attitude toward examiner:  Attitude Toward Examiner: Cooperative  Thought and Language  Speech flow: Speech Flow: Normal  Thought content:  Thought Content: Appropriate to mood and circumstances  Preoccupation:     Hallucinations:     Organization:     Transport planner of Knowledge:  Fund of Knowledge: Average  Intelligence:  Intelligence: Average  Abstraction:  Abstraction: Normal  Judgement:  Judgement: Normal  Reality Testing:  Reality Testing: Adequate  Insight:  Insight: Good  Decision Making:  Decision Making: Normal  Social Functioning  Social Maturity:  Social Maturity:  (No friends locally yet)  Social Judgement:  Social Judgement: Normal  Stress  Stressors:  Stressors: Transitions (Moved to [place] in February 2017.  Had knee replacement in August.)  Coping Ability:  Coping Ability: Exhausted  Skill Deficits:     Supports:      Family  and Psychosocial History: Family history Marital status: Married Number of Years Married: 63 What types of issues is patient dealing with in the relationship?: "We get along fine."   Additional relationship information: Planning to take a cruise for their 50th anniversary   Are you sexually active?: Yes What is your sexual orientation?: heterosexual Has your sexual activity been affected by drugs, alcohol, medication, or emotional stress?: no Does patient have children?: Yes How many children?: 1 How is patient's relationship with their children?: Son, Ysidro Evert (42) Very close.  Teaches at [employer].  He has 3 children (15F,65M, 40F)   She says he is "high strung" like she is.  Childhood History:  Childhood History By whom was/is the patient raised?: Both parents Additional childhood history information: "They were older than a lot of other parents."   Description of patient's relationship with caregiver when they were a child: "I always tagged along with my dad.  He was a sweetheart."  Mom-"She was a difficult woman.  She would have mood swings.  Had insomnia.  I've always understood her."  Both had a love of reading. Patient's description of current relationship with people who raised him/her: Dad died of a heart attack when she was 15.  Mom died in 7.    Does patient have siblings?: Yes Number of Siblings: 4 Description of patient's current relationship with siblings: 3 were older half-brothers from her dad's 49st marriage.  Sister, Baldo Ash (76)- "My sister and I are opposites."  Upset with sister for taking advantage of others financially.  She lives in [place], [place]. Did patient suffer any verbal/emotional/physical/sexual abuse as a child?: No Did patient suffer from severe childhood neglect?: No Has patient ever been sexually abused/assaulted/raped as an adolescent or adult?: No Was the patient ever a victim of a crime or a disaster?: No Witnessed domestic violence?: No Has  patient been effected by domestic violence as an adult?: No  CCA Part Two B  Employment/Work Situation: Employment / Work Copywriter, advertising Employment situation: Retired (Former Banker) Has patient ever been in the [employer]?: No Are There Guns or Chiropractor in [place]?: No  Education: Education Did Teacher, adult education From [employer]?: Yes Did Physicist, medical?: Yes What Type of College Degree Do you Have?: BA English/Library Did Heritage manager?: Yes What is Your Press photographer?: Masters in [employer] Did You Have Any Difficulty At [employer]?: No  Religion: Religion/Spirituality Are You A Religious Person?: No (I'd like to be more spiritual.  I guess you'd say I'm agnostic.)  Leisure/Recreation: Leisure / Recreation Leisure and Hobbies: Loves to read, takes walks, spend time with grandchildren  Exercise/Diet: Exercise/Diet Do You Exercise?: Yes What Type of Exercise Do You Do?: Run/Walk Have You Gained or Lost A Significant Amount of Weight in the Past Six Months?: No Do You Follow a Special Diet?: No Do You Have Any Trouble Sleeping?: Yes Explanation of Sleeping Difficulties: Trouble falling asleep  CCA Part Two C  Alcohol/Drug Use: Alcohol / Drug Use History of alcohol / drug use?: No history of alcohol / drug abuse                      CCA Part Three  ASAM's:  Six  Dimensions of Multidimensional Assessment  Dimension 1:  Acute Intoxication and/or Withdrawal Potential:     Dimension 2:  Biomedical Conditions and Complications:     Dimension 3:  Emotional, Behavioral, or Cognitive Conditions and Complications:     Dimension 4:  Readiness to Change:     Dimension 5:  Relapse, Continued use, or Continued Problem Potential:     Dimension 6:  Recovery/Living Environment:      Substance use Disorder (SUD)    Social Function:  Social Functioning Social Maturity:  (No friends locally yet) Social Judgement: Normal  Stress:   Stress Stressors: Transitions (Moved to [place] in February 2017.  Had knee replacement in August.) Coping Ability: Exhausted Patient Takes Medications The Way The Doctor Instructed?: Yes  Risk Assessment- Self-Harm Potential: Risk Assessment For Self-Harm Potential Thoughts of Self-Harm: No current thoughts Additional Comments for Self-Harm Potential: Denies any history of harm to self or active SI  Risk Assessment -Dangerous to Others Potential: Risk Assessment For Dangerous to Others Potential Method: No Plan Additional Comments for Danger to Others Potential: Denies any history of harm to others  DSM5 Diagnoses: Patient Active Problem List   Diagnosis Date Noted  . Obsessive-compulsive personality trait 12/07/2015  . Morbid obesity ([place]) 09/19/2015  . Primary localized osteoarthritis of right knee   . Cardiac risk counseling 08/16/2015  . Essential hypertension 08/04/2015  . Hyperlipidemia 08/04/2015  . Osteopenia 08/04/2015  . Anxiety associated with depression 08/04/2015  . Right knee pain 06/14/2015  . Hyposomnia, insomnia or sleeplessness associated with anxiety 06/14/2015      Recommendations for Services/Supports/Treatments: Recommendations for Services/Supports/Treatments Recommendations For Services/Supports/Treatments: Individual Therapy, Other (Comment) (Refer for sleep study)    Garnette Scheuermann

## 2015-12-16 ENCOUNTER — Encounter: Payer: Self-pay | Admitting: Osteopathic Medicine

## 2015-12-24 ENCOUNTER — Encounter: Payer: Self-pay | Admitting: Osteopathic Medicine

## 2015-12-27 ENCOUNTER — Other Ambulatory Visit: Payer: Self-pay | Admitting: Osteopathic Medicine

## 2015-12-27 MED ORDER — ZOLPIDEM TARTRATE 10 MG PO TABS: 5.0000 mg | ORAL_TABLET | Freq: Every evening | ORAL | 3 refills | Status: DC | PRN

## 2015-12-29 ENCOUNTER — Ambulatory Visit (INDEPENDENT_AMBULATORY_CARE_PROVIDER_SITE_OTHER): Payer: Medicare Other | Admitting: Licensed Clinical Social Worker

## 2015-12-29 DIAGNOSIS — F5105 Insomnia due to other mental disorder: Secondary | ICD-10-CM

## 2015-12-29 DIAGNOSIS — F419 Anxiety disorder, unspecified: Secondary | ICD-10-CM

## 2015-12-30 NOTE — Progress Notes (Signed)
   THERAPIST PROGRESS NOTE  Session Time: 1:05pm-2:00pm  Participation Level: Active  Behavioral Response: NeatAlertEuthymic  Type of Therapy: Individual Therapy  Treatment Goals addressed: Improve sleep and reduce anxiety related to sleep  Interventions: Treatment planning, sleep hygiene, mindfulness  Suicidal/Homicidal: Denied both  Therapist Interventions: Collaborated with patient to develop her treatment plan.   Reviewed a worksheet listing tips for better sleep.  Encouraged patient to implement the ones she has not tried before. Introduced patient to mindfulness.  Provided a definition.  Explained how it can be a useful way to cope with unhelpful thoughts.  Provided patient with 4 pages to read for homework about mindfulness.   Summary:  Developed the following treatment goal: [name] will report being able to fall asleep without the use of medication and a significant decrease in anxiety related to sleep. Reported that she has been getting more sleep lately as a result of her doctor increasing the dosage of her Ambien.  Getting approximately 6-7 hours on average.   Does have an appointment scheduled on January 6th with a sleep specialist. Has tried a variety of interventions for sleep including cutting off caffeine for the past year, seeing someone for accupuncture for a few sessions, avoiding alcohol, and implementing a relaxing bedtime routine.  Acknowledged that she has also practiced different relaxation exercises at times and those have been somewhat helpful.   Expressed interest in learning more about mindfulness and how to practice it.     Plan: Will expand on mindfulness and teach the 4-7-8 Breathing exercise  Diagnosis: Insomnia or sleeplessness associated with anxiety    Garnette Scheuermann, LCSW 12/29/2015

## 2016-01-02 ENCOUNTER — Encounter: Payer: Self-pay | Admitting: Osteopathic Medicine

## 2016-01-03 ENCOUNTER — Other Ambulatory Visit: Payer: Self-pay

## 2016-01-03 MED ORDER — TRIAMTERENE-HCTZ 37.5-25 MG PO CAPS: 1.0000 | ORAL_CAPSULE | Freq: Every day | ORAL | 3 refills | Status: DC

## 2016-01-03 NOTE — Telephone Encounter (Signed)
PATIENT REQUEST REFILL FOR tRIAMTERENE 37.5-25 MG. #90 3 REFILLS SENT TO PHARMACY. Jarissa Sheriff,CMA

## 2016-01-05 ENCOUNTER — Other Ambulatory Visit: Payer: Self-pay | Admitting: *Deleted

## 2016-01-05 DIAGNOSIS — F418 Other specified anxiety disorders: Secondary | ICD-10-CM

## 2016-01-05 MED ORDER — CLONAZEPAM 0.5 MG PO TABS: 0.5000 mg | ORAL_TABLET | Freq: Two times a day (BID) | ORAL | 0 refills | Status: DC | PRN

## 2016-01-18 ENCOUNTER — Ambulatory Visit (INDEPENDENT_AMBULATORY_CARE_PROVIDER_SITE_OTHER): Payer: Medicare Other | Admitting: Osteopathic Medicine

## 2016-01-18 ENCOUNTER — Encounter: Payer: Self-pay | Admitting: Osteopathic Medicine

## 2016-01-18 VITALS — BP 164/61 | HR 90 | Ht 63.0 in | Wt 201.0 lb

## 2016-01-18 DIAGNOSIS — I10 Essential (primary) hypertension: Secondary | ICD-10-CM

## 2016-01-18 DIAGNOSIS — G4700 Insomnia, unspecified: Secondary | ICD-10-CM | POA: Diagnosis not present

## 2016-01-18 DIAGNOSIS — F605 Obsessive-compulsive personality disorder: Secondary | ICD-10-CM

## 2016-01-18 DIAGNOSIS — M1990 Unspecified osteoarthritis, unspecified site: Secondary | ICD-10-CM

## 2016-01-18 DIAGNOSIS — F418 Other specified anxiety disorders: Secondary | ICD-10-CM

## 2016-01-18 DIAGNOSIS — IMO0001 Reserved for inherently not codable concepts without codable children: Secondary | ICD-10-CM

## 2016-01-18 MED ORDER — ZOLPIDEM TARTRATE ER 6.25 MG PO TBCR: 6.2500 mg | EXTENDED_RELEASE_TABLET | Freq: Every evening | ORAL | 1 refills | Status: DC | PRN

## 2016-01-18 NOTE — Progress Notes (Signed)
HPI: [name] is a 70 y.o. female  who presents to [facility] today, 01/18/16,  for chief complaint of:  Chief Complaint  Patient presents with  . Depression    Insomnia:   History and previous evaluation - Long-standing insomnia, ongoing since patient was in her 57s. Sleep onset as well as maintenance issues. Stress exacerbates these. She gets obsessive thoughts also about her insomnia to where she worries that she doesn't sleep, so she sleeps even worse. Has been hospitalized for this issue in the 1980s/1990s. At that time was placed on temazepam and clonazepam. Previously following with psychiatry.   Temazepam wasn't working as well as it used to, so at previous visit we initiated suvorexant but pt unable to tolerate the side effects of this so we initiated Ambien.   Followed up with psychology/therapist 12/14/15 for CBT insomnia and other OCD traits.   Sleep specialist referral was ordered 12/16/2015, has appointment upcoming in 02/2016  A lot of anxiety surrounding insomnia issues. Patient gets anxious about not sleeping, and then his anxiety seems to keep her awake, unfortunate cycle/positive feedback loop ensues. Reports that Ambien was working for a few nights but then she has been waking up in the middle of the night and having panic problems, not able to get back to sleep.  Depression:  Previously on Prozac, then stopped, then was on  (+)FH depression and mood problems. Preivously on she thinks Mitrazapine but gained weight on this. Currently on effexor, clonazepam. Psych referral placed 12/07/15 for CBT for this and insomnia, visit on 12/14/15 notes reviewed  HTN:  Currently taking one half tablet of the triamterene/hydrochlorothiazide. Blood pressure elevated today, pt thinks d/t anxiety/insomnia, no chest pain, pressure, shortness of breath.  Arthritis:  Hand have been hurting. Generic OTC medication tried, these "were sending me to  the bathroom." Advil and Tylenol weren't Causing side effects really helping either.    Past medical, surgical, social and family history reviewed: Past Medical History:  Diagnosis Date  . Anxiety   . Depression   . Headache    SINUS  . Hyperlipidemia   . Hypertension   . Osteopenia   . Primary localized osteoarthritis of right knee    Past Surgical History:  Procedure Laterality Date  . BREAST LUMPECTOMY Right 1997   Done in [place]  . CARPAL TUNNEL RELEASE Right 2015   Ortho in [place]  . LASIK    . MENISCUS REPAIR Right 2011   Orthopedic in [place]  . TOTAL KNEE ARTHROPLASTY Right 09/20/2015  . TOTAL KNEE ARTHROPLASTY Right 09/20/2015   Procedure: TOTAL KNEE ARTHROPLASTY;  Surgeon: Elsie Saas, MD;  Location: [facility];  Service: Orthopedics;  Laterality: Right;  . Trigger Thumb Right 2015   Done in [facility] at the same time as the carpal tunnel release  . TUBAL LIGATION  1980   Social History  Substance Use Topics  . Smoking status: Never Smoker  . Smokeless tobacco: Never Used  . Alcohol use Yes     Comment: 1 glass a month   Family History  Problem Relation Age of Onset  . Heart failure Mother   . Heart attack Mother   . Alcohol abuse Maternal Aunt   . Heart attack Father   . Irregular heart beat Sister   . Hypertension Sister   . Heart disease Brother      Current medication list and allergy/intolerance information reviewed:   Current Outpatient Prescriptions on File Prior to Visit  Medication Sig Dispense Refill  . amLODipine-benazepril (LOTREL) 5-20 MG capsule Take 1 capsule by mouth daily. 90 capsule 1  . atorvastatin (LIPITOR) 20 MG tablet Take 20 mg by mouth daily.    . clonazePAM (KLONOPIN) 0.5 MG tablet Take 1 tablet (0.5 mg total) by mouth 2 (two) times daily as needed for anxiety. DO NOT take with Temazepam; can fill on or after 01/08/16 60 tablet 0  . fluticasone (FLONASE) 50 MCG/ACT nasal spray Place into both nostrils daily.    . raloxifene  (EVISTA) 60 MG tablet Take 1 tablet (60 mg total) by mouth daily. 90 tablet 3  . triamterene-hydrochlorothiazide (DYAZIDE) 37.5-25 MG capsule Take 1 each (1 capsule total) by mouth daily. 90 capsule 3  . venlafaxine XR (EFFEXOR-XR) 150 MG 24 hr capsule Take 150 mg by mouth daily with breakfast.    . zolpidem (AMBIEN) 10 MG tablet Take 0.5-1 tablets (5-10 mg total) by mouth at bedtime as needed for sleep. Caution: do not take with opioid pain medications or other sedating substances 30 tablet 3   No current facility-administered medications on file prior to visit.    Allergies  Allergen Reactions  . Penicillins Other (See Comments)    UNSPECIFIED   . Sulfa Antibiotics Other (See Comments)    UNSPECIFIED       Review of Systems:  Constitutional: No recent illness  HEENT: No  headache, no vision change  Cardiac: No  chest pain, No  pressure, No palpitations  Respiratory:  No  shortness of breath. No  Cough  Neurologic: No  weakness, No  Dizziness  Psychiatric: +concerns with depression, +concerns with anxiety, +sleep problems  Exam:  BP (!) 164/61   Pulse 90   Ht 5\' 3"  (1.6 m)   Wt 201 lb (91.2 kg)   BMI 35.61 kg/m   Constitutional: VS see above. General Appearance: alert, well-developed, well-nourished, NAD  Neck: No masses, trachea midline.   Respiratory: Normal respiratory effort.    Musculoskeletal: Gait normal. Symmetric and independent movement of all extremities  Neurological: Normal balance/coordination. No tremor.  Skin: warm, dry, intact.   Psychiatric: Normal judgment/insight. Anxious mood and affect. Oriented x3.      ASSESSMENT/PLAN: Risks versus benefits as sedatives, benzodiazepines, alternative medications as well as behavioral modifications were discussed at length with the patient. Sleep specialist referral is in place. Patient is going to behavioral health downstairs for CBT/counseling regarding insomnia.  Insomnia, unspecified type -  Discontinue immediate release Ambien, initiate extended release. Cautioned patient, use lowest effective dose, certainly start at lowest dose.  Obsessive-compulsive personality trait - Following with counseling  Essential hypertension - Recheck at next visit, patient reports significant anxiety/insomnia today.  Anxiety associated with depression - Treating insomnia, may consider augmentation of effexor. Consider psychiatric consultation.   Osteoarthritis, unspecified osteoarthritis type, unspecified site - Advised can combine Tylenol/NSAID, keep at lower dose. Consider prescription anti-inflammatory.      Visit summary with medication list and pertinent instructions was printed for patient to review. All questions at time of visit were answered - patient instructed to contact office with any additional concerns. ER/RTC precautions were reviewed with the patient. Follow-up plan: Return in about 4 weeks (around 02/15/2016) for [place] .

## 2016-01-20 ENCOUNTER — Ambulatory Visit (HOSPITAL_COMMUNITY): Payer: Self-pay | Admitting: Licensed Clinical Social Worker

## 2016-02-01 ENCOUNTER — Emergency Department (INDEPENDENT_AMBULATORY_CARE_PROVIDER_SITE_OTHER)
Admission: EM | Admit: 2016-02-01 | Discharge: 2016-02-01 | Disposition: A | Discharge status: Home / Self Care | Payer: Medicare Other | Source: Home / Self Care | Attending: Family Medicine | Admitting: Family Medicine

## 2016-02-01 ENCOUNTER — Encounter: Payer: Self-pay | Admitting: *Deleted

## 2016-02-01 DIAGNOSIS — J0100 Acute maxillary sinusitis, unspecified: Secondary | ICD-10-CM | POA: Diagnosis not present

## 2016-02-01 MED ORDER — CEPHALEXIN 500 MG PO CAPS: 500.0000 mg | ORAL_CAPSULE | Freq: Three times a day (TID) | ORAL | 0 refills | Status: DC

## 2016-02-01 NOTE — Discharge Instructions (Signed)
Take plain guaifenesin (1200mg  extended release tabs such as Mucinex) twice daily, with plenty of water, for cough and congestion.  If blood pressure is not affected, may add Pseudoephedrine (30mg , one or two every 4 to 6 hours) for sinus congestion.  Get adequate rest.   May use Afrin nasal spray (or generic oxymetazoline) twice daily for about 5 days and then discontinue.  Also recommend using saline nasal spray several times daily and saline nasal irrigation (AYR is a common brand).  Use Flonase nasal spray each morning after using Afrin nasal spray and saline nasal irrigation. Try warm salt water gargles for sore throat.  Stop all antihistamines for now, and other non-prescription cough/cold preparations. May take Delsym Cough Suppressant at bedtime for nighttime cough.  Follow-up with family doctor if not improving about10 days.

## 2016-02-01 NOTE — ED Triage Notes (Signed)
Pt c/o productive cough and ears feeling clogged x 10 days. Denies fever. Reports some chills. She had a knee replacement in 8/17'.

## 2016-02-01 NOTE — ED Provider Notes (Signed)
[name]    CSN: [id number] Arrival date & time: 02/01/16  1423     History   Chief Complaint Chief Complaint  Patient presents with  . Cough    HPI November [name] is a 70 y.o. female.   Patient complains of 1.5 week history of sinus congestion that persists.  She has had a productive cough, facial pressure, and her ears feel clogged.  She has had chills during the past 3 days.   The history is provided by the patient.    Past Medical History:  Diagnosis Date  . Anxiety   . Depression   . Headache    SINUS  . Hyperlipidemia   . Hypertension   . Osteopenia   . Primary localized osteoarthritis of right knee     Patient Active Problem List   Diagnosis Date Noted  . Obsessive-compulsive personality trait 12/07/2015  . Morbid obesity ([place]) 09/19/2015  . Primary localized osteoarthritis of right knee   . Cardiac risk counseling 08/16/2015  . Essential hypertension 08/04/2015  . Hyperlipidemia 08/04/2015  . Osteopenia 08/04/2015  . Anxiety associated with depression 08/04/2015  . Right knee pain 06/14/2015  . Hyposomnia, insomnia or sleeplessness associated with anxiety 06/14/2015    Past Surgical History:  Procedure Laterality Date  . BREAST LUMPECTOMY Right 1997   Done in [place]  . CARPAL TUNNEL RELEASE Right 2015   Ortho in [place]  . LASIK    . MENISCUS REPAIR Right 2011   Orthopedic in [place]  . TOTAL KNEE ARTHROPLASTY Right 09/20/2015  . TOTAL KNEE ARTHROPLASTY Right 09/20/2015   Procedure: TOTAL KNEE ARTHROPLASTY;  Surgeon: Elsie Saas, MD;  Location: [facility];  Service: Orthopedics;  Laterality: Right;  . Trigger Thumb Right 2015   Done in [facility] at the same time as the carpal tunnel release  . TUBAL LIGATION  1980    OB History    No data available       Home Medications    Prior to Admission medications   Medication Sig Start Date End Date Taking? Authorizing Provider  amLODipine-benazepril (LOTREL) 5-20 MG capsule  Take 1 capsule by mouth daily. 10/19/15   Emeterio Reeve, DO  atorvastatin (LIPITOR) 20 MG tablet Take 20 mg by mouth daily.    Historical Provider, MD  cephALEXin (KEFLEX) 500 MG capsule Take 1 capsule (500 mg total) by mouth 3 (three) times daily. 02/01/16   Kandra Nicolas, MD  clonazePAM (KLONOPIN) 0.5 MG tablet Take 1 tablet (0.5 mg total) by mouth 2 (two) times daily as needed for anxiety. DO NOT take with Temazepam; can fill on or after 01/08/16 01/05/16   Emeterio Reeve, DO  fluticasone [facility]) 50 MCG/ACT nasal spray Place into both nostrils daily.    Historical Provider, MD  raloxifene (EVISTA) 60 MG tablet Take 1 tablet (60 mg total) by mouth daily. 08/04/15   Emeterio Reeve, DO  triamterene-hydrochlorothiazide (DYAZIDE) 37.5-25 MG capsule Take 1 each (1 capsule total) by mouth daily. 01/03/16   Emeterio Reeve, DO  venlafaxine XR (EFFEXOR-XR) 150 MG 24 hr capsule Take 150 mg by mouth daily with breakfast.    Historical Provider, MD  zolpidem (AMBIEN CR) 6.25 MG CR tablet Take 1-2 tablets (6.25-12.5 mg total) by mouth at bedtime as needed for sleep. Start with 6.25 mg at night. Use lowest effective dose. 01/18/16   Emeterio Reeve, DO    Family History Family History  Problem Relation Age of Onset  . Heart failure Mother   .  Heart attack Mother   . Alcohol abuse Maternal Aunt   . Heart attack Father   . Irregular heart beat Sister   . Hypertension Sister   . Heart disease Brother     Social History Social History  Substance Use Topics  . Smoking status: Never Smoker  . Smokeless tobacco: Never Used  . Alcohol use Yes     Comment: 1 glass a month     Allergies   Penicillins and Sulfa antibiotics   Review of Systems Review of Systems No sore throat + cough No pleuritic pain No wheezing + nasal congestion + post-nasal drainage + sinus pain/pressure No itchy/red eyes ? earache No hemoptysis No SOB No fever, + chills No nausea No vomiting No  abdominal pain No diarrhea No urinary symptoms No skin rash + fatigue No myalgias No headache Used OTC meds without relief   Physical Exam Triage Vital Signs ED Triage Vitals  Enc Vitals Group     BP 02/01/16 1457 147/86     Pulse Rate 02/01/16 1457 95     Resp 02/01/16 1457 18     Temp 02/01/16 1457 98.1 F (36.7 C)     Temp Source 02/01/16 1457 Oral     SpO2 02/01/16 1457 98 %     Weight 02/01/16 1458 200 lb (90.7 kg)     Height --      Head Circumference --      Peak Flow --      Pain Score 02/01/16 1459 0     Pain Loc --      Pain Edu? --      Excl. in [place]? --    No data found.   Updated Vital Signs BP 147/86 (BP Location: Left Arm)   Pulse 95   Temp 98.1 F (36.7 C) (Oral)   Resp 18   Wt 200 lb (90.7 kg)   SpO2 98%   BMI 35.43 kg/m   Visual Acuity Right Eye Distance:   Left Eye Distance:   Bilateral Distance:    Right Eye Near:   Left Eye Near:    Bilateral Near:     Physical Exam Nursing notes and Vital Signs reviewed. Appearance:  Patient appears stated age, and in no acute distress Eyes:  Pupils are equal, round, and reactive to light and accomodation.  Extraocular movement is intact.  Conjunctivae are not inflamed  Ears:  Canals normal.  Tympanic membranes normal.  Nose:   Congested turbinates, especially on the right.  Right maxillary sinus tenderness is present.  Pharynx:  Normal Neck:  Supple.  Tender enlarged posterior/lateral nodes are palpated bilaterally  Lungs:  Clear to auscultation.  Breath sounds are equal.  Moving air well. Heart:  Regular rate and rhythm without murmurs, rubs, or gallops.  Abdomen:  Nontender without masses or hepatosplenomegaly.  Bowel sounds are present.  No CVA or flank tenderness.  Extremities:  No edema.  Skin:  No rash present.    UC Treatments / Results  Labs (all labs ordered are listed, but only abnormal results are displayed) Labs Reviewed - No data to display Tympanometry:  Right ear tympanogram  "noisy," but otherwise normal; Left ear tympanogram normal EKG  EKG Interpretation None       Radiology No results found.  Procedures Procedures (including critical care time)  Medications Ordered in UC Medications - No data to display   Initial Impression / Assessment and Plan / UC Course  I have reviewed the triage vital  signs and the nursing notes.  Pertinent labs & imaging results that were available during my care of the patient were reviewed by me and considered in my medical decision making (see chart for details).  Clinical Course   Begin Keflex 500mg  TID (patient notes that she has had no adverse effects from Keflex). Take plain guaifenesin (1200mg  extended release tabs such as Mucinex) twice daily, with plenty of water, for cough and congestion.  If blood pressure is not affected, may add Pseudoephedrine (30mg , one or two every 4 to 6 hours) for sinus congestion.  Get adequate rest.   May use Afrin nasal spray (or generic oxymetazoline) twice daily for about 5 days and then discontinue.  Also recommend using saline nasal spray several times daily and saline nasal irrigation (AYR is a common brand).  Use Flonase nasal spray each morning after using Afrin nasal spray and saline nasal irrigation. Try warm salt water gargles for sore throat.  Stop all antihistamines for now, and other non-prescription cough/cold preparations. May take Delsym Cough Suppressant at bedtime for nighttime cough.  Follow-up with family doctor if not improving about10 days.      Final Clinical Impressions(s) / UC Diagnoses   Final diagnoses:  Acute maxillary sinusitis, recurrence not specified    New Prescriptions New Prescriptions   CEPHALEXIN (KEFLEX) 500 MG CAPSULE    Take 1 capsule (500 mg total) by mouth 3 (three) times daily.     Kandra Nicolas, MD 02/13/16 [phone]

## 2016-02-05 ENCOUNTER — Other Ambulatory Visit: Payer: Self-pay | Admitting: Osteopathic Medicine

## 2016-02-05 DIAGNOSIS — F418 Other specified anxiety disorders: Secondary | ICD-10-CM

## 2016-02-09 ENCOUNTER — Encounter: Payer: Self-pay | Admitting: Osteopathic Medicine

## 2016-02-09 ENCOUNTER — Ambulatory Visit (INDEPENDENT_AMBULATORY_CARE_PROVIDER_SITE_OTHER): Payer: Medicare Other | Admitting: Licensed Clinical Social Worker

## 2016-02-09 DIAGNOSIS — F5105 Insomnia due to other mental disorder: Secondary | ICD-10-CM

## 2016-02-09 DIAGNOSIS — F419 Anxiety disorder, unspecified: Secondary | ICD-10-CM | POA: Diagnosis not present

## 2016-02-09 NOTE — Progress Notes (Signed)
   THERAPIST PROGRESS NOTE  Session Time: 1:00pm-1:45pm  Participation Level: Active  Behavioral Response: NeatAlertEuthymic  Type of Therapy: Individual Therapy  Treatment Goals addressed: Improve sleep and reduce anxiety related to sleep  Interventions: Mindfulness  Suicidal/Homicidal: Denied both  Therapist Interventions: Expanded on the concept of mindfulness.  Talked about how you can learn to recognize unhelpful thinking and choose to let go of those thoughts and shift your focus to something in the present.   Recommended a particular breathing exercise called the 4-7-8 Breath.  Provided patient with a handout about the technique.     Summary:  Reported she has been having more success with falling asleep in a reasonable amount of time lately.  Her primary care doctor has her taking extended release Ambien.  Still has trouble getting back to sleep when she gets up in the middle of the night to use the bathroom. Reported feeling more relaxed overall.  Noted she has been doing meditation almost daily and setting aside a particular time each day to write about the worries on her mind.  Indicated that she has already been practicing a fairly effective breathing exercise which sounds similar to the one recommended.         Appointment with sleep specialist has been rescheduled for late January.    Plan: Patient would like to continue to work on incorporating the techniques she has learned about on her own.  Says she will call to make another appointment if she thinks she needs one.    Diagnosis: Insomnia or sleeplessness associated with anxiety    Armandina Stammer 02/09/2016

## 2016-02-15 ENCOUNTER — Encounter: Payer: Self-pay | Admitting: Osteopathic Medicine

## 2016-02-15 ENCOUNTER — Ambulatory Visit (INDEPENDENT_AMBULATORY_CARE_PROVIDER_SITE_OTHER): Payer: Medicare Other | Admitting: Osteopathic Medicine

## 2016-02-15 VITALS — BP 132/81 | HR 96 | Ht 65.0 in | Wt 157.0 lb

## 2016-02-15 DIAGNOSIS — F605 Obsessive-compulsive personality disorder: Secondary | ICD-10-CM | POA: Diagnosis not present

## 2016-02-15 DIAGNOSIS — F5101 Primary insomnia: Secondary | ICD-10-CM | POA: Diagnosis not present

## 2016-02-15 DIAGNOSIS — F418 Other specified anxiety disorders: Secondary | ICD-10-CM | POA: Diagnosis not present

## 2016-02-15 DIAGNOSIS — IMO0001 Reserved for inherently not codable concepts without codable children: Secondary | ICD-10-CM

## 2016-02-15 MED ORDER — TEMAZEPAM 30 MG PO CAPS: 30.0000 mg | ORAL_CAPSULE | Freq: Every day | ORAL | 0 refills | Status: DC

## 2016-02-15 NOTE — Patient Instructions (Addendum)
Plan:   Stop the Ambien medications  Will get back on the Temazepam   Will place referral for psychiatry  Plan to keep currently scheduled appointment with Sleep

## 2016-02-15 NOTE — Progress Notes (Signed)
HPI: [name] is a 71 y.o. female  who presents to [name] today, 02/15/16,  for chief complaint of:  Chief Complaint  Patient presents with  . Follow-up    ANXIETY AND SLEEP    Insomnia:   History and previous evaluation - Long-standing insomnia, ongoing since patient was in her 72s. Sleep onset as well as maintenance issues. Stress exacerbates these. She gets obsessive thoughts also about her insomnia to where she worries that she doesn't sleep, so she sleeps even worse. Has been hospitalized for this issue in the 1980s/1990s. At that time was placed on temazepam and clonazepam. Previously following with psychiatry.   A lot of anxiety surrounding insomnia issues. Patient gets anxious about not sleeping, and then his anxiety seems to keep her awake, unfortunate cycle/positive feedback loop ensues. Reports that Ambien was working for a few nights but then she has been waking up in the middle of the night and having panic problems, not able to get back to sleep. Since seeing me...  Temazepam wasn't working as well as it used to, so at 12/10/15 visit we initiated suvorexant but pt unable to tolerate the side effects of this so we initiated Ambien.   Followed up with psychology/therapist 12/14/15 for CBT insomnia and other OCD. Has been following since and found therapy moderately helpful  Sleep specialist referral was ordered 12/16/2015, has appointment upcoming in 02/2016  01/18/16 tried Ambien CR, was finding she was waking up even on this and taking a second one and finding there were problems with grogginess when she took two, so she went back to regular ambien. Requests to get back on the Temazepam at 02/15/15 visit    Depression:  Previously on Prozac, then stopped, then was on  (+)FH depression and mood problems. Preivously on she thinks Mitrazapine but gained weight on this. Currently on effexor, clonazepam. Psych referral placed 12/07/15  for CBT for this and insomnia, has helped somewhat but pt requests referral to psychiatrist      Past medical, surgical, social and family history reviewed: Past Medical History:  Diagnosis Date  . Anxiety   . Depression   . Headache    SINUS  . Hyperlipidemia   . Hypertension   . Osteopenia   . Primary localized osteoarthritis of right knee    Past Surgical History:  Procedure Laterality Date  . BREAST LUMPECTOMY Right 1997   Done in [place]  . CARPAL TUNNEL RELEASE Right 2015   Ortho in [place]  . LASIK    . MENISCUS REPAIR Right 2011   Orthopedic in [place]  . TOTAL KNEE ARTHROPLASTY Right 09/20/2015  . TOTAL KNEE ARTHROPLASTY Right 09/20/2015   Procedure: TOTAL KNEE ARTHROPLASTY;  Surgeon: Elsie Saas, MD;  Location: [facility];  Service: Orthopedics;  Laterality: Right;  . Trigger Thumb Right 2015   Done in [facility] at the same time as the carpal tunnel release  . TUBAL LIGATION  1980   Social History  Substance Use Topics  . Smoking status: Never Smoker  . Smokeless tobacco: Never Used  . Alcohol use Yes     Comment: 1 glass a month   Family History  Problem Relation Age of Onset  . Heart failure Mother   . Heart attack Mother   . Alcohol abuse Maternal Aunt   . Heart attack Father   . Irregular heart beat Sister   . Hypertension Sister   . Heart disease Brother      Current  medication list and allergy/intolerance information reviewed:   Current Outpatient Prescriptions on File Prior to Visit  Medication Sig Dispense Refill  . amLODipine-benazepril (LOTREL) 5-20 MG capsule Take 1 capsule by mouth daily. 90 capsule 1  . atorvastatin (LIPITOR) 20 MG tablet Take 20 mg by mouth daily.    . cephALEXin (KEFLEX) 500 MG capsule Take 1 capsule (500 mg total) by mouth 3 (three) times daily. 30 capsule 0  . clonazePAM (KLONOPIN) 0.5 MG tablet TAKE ONE TABLET BY MOUTH TWICE A  DAY AS NEEDED FOR ANXIETY. DO NOT TAKE WITH TEMAZEPAM 60 tablet 0  . fluticasone (FLONASE)  50 MCG/ACT nasal spray Place into both nostrils daily.    . raloxifene (EVISTA) 60 MG tablet Take 1 tablet (60 mg total) by mouth daily. 90 tablet 3  . triamterene-hydrochlorothiazide (DYAZIDE) 37.5-25 MG capsule Take 1 each (1 capsule total) by mouth daily. 90 capsule 3  . venlafaxine XR (EFFEXOR-XR) 150 MG 24 hr capsule Take 150 mg by mouth daily with breakfast.    . zolpidem (AMBIEN CR) 6.25 MG CR tablet Take 1-2 tablets (6.25-12.5 mg total) by mouth at bedtime as needed for sleep. Start with 6.25 mg at night. Use lowest effective dose. 30 tablet 1   No current facility-administered medications on file prior to visit.    Allergies  Allergen Reactions  . Penicillins Other (See Comments)    UNSPECIFIED   . Sulfa Antibiotics Other (See Comments)    UNSPECIFIED       Review of Systems:  Constitutional: No recent illness  Cardiac: No  chest pain, No  pressure, No palpitations  Respiratory:  No  shortness of breath. No  Cough  Neurologic: No  weakness, No  Dizziness  Psychiatric: +concerns with depression, +concerns with anxiety, +sleep problems  Exam:  BP 132/81   Pulse 96   Ht 5\' 5"  (1.651 m)   Wt 157 lb (71.2 kg)   BMI 26.13 kg/m   Constitutional: VS see above. General Appearance: alert, well-developed, well-nourished, NAD  Neck: No masses, trachea midline.   Respiratory: Normal respiratory effort.    Neurological: Normal balance/coordination. No tremor.  Skin: warm, dry, intact.   Psychiatric: Normal judgment/insight. Anxious mood and affect. Oriented x3.      ASSESSMENT/PLAN: Sleep specialist referral is in place. Patient is going to behavioral health downstairs for CBT/counseling regarding insomnia. Referral for psychiatry placed today.   Primary insomnia - Plan: temazepam (RESTORIL) 30 MG capsule, Ambulatory referral to Psychiatry  Obsessive-compulsive personality trait  Anxiety associated with depression      Visit summary with medication list and  pertinent instructions was printed for patient to review. All questions at time of visit were answered - patient instructed to contact office with any additional concerns. ER/RTC precautions were reviewed with the patient. Follow-up plan: Return for as needed follow-up after psychiatry and sleep medicine evaluation .

## 2016-02-21 ENCOUNTER — Encounter: Payer: Self-pay | Admitting: Osteopathic Medicine

## 2016-02-22 ENCOUNTER — Institutional Professional Consult (permissible substitution): Payer: Self-pay | Admitting: Pulmonary Disease

## 2016-03-06 ENCOUNTER — Encounter: Payer: Self-pay | Admitting: Osteopathic Medicine

## 2016-03-06 ENCOUNTER — Institutional Professional Consult (permissible substitution): Payer: Self-pay | Admitting: Pulmonary Disease

## 2016-03-06 MED ORDER — VENLAFAXINE HCL ER 150 MG PO CP24: 150.0000 mg | ORAL_CAPSULE | Freq: Every day | ORAL | 3 refills | Status: DC

## 2016-03-14 ENCOUNTER — Other Ambulatory Visit: Payer: Self-pay | Admitting: Osteopathic Medicine

## 2016-03-14 DIAGNOSIS — F5101 Primary insomnia: Secondary | ICD-10-CM

## 2016-03-23 DIAGNOSIS — M25561 Pain in right knee: Secondary | ICD-10-CM | POA: Diagnosis not present

## 2016-03-24 ENCOUNTER — Telehealth: Payer: Self-pay | Admitting: *Deleted

## 2016-03-24 NOTE — Telephone Encounter (Signed)
Key: [id number] pa submitted through covermymed

## 2016-03-29 ENCOUNTER — Ambulatory Visit (INDEPENDENT_AMBULATORY_CARE_PROVIDER_SITE_OTHER): Payer: Medicare Other | Admitting: Osteopathic Medicine

## 2016-03-29 VITALS — BP 159/65 | HR 83 | Temp 98.1°F | Wt 203.0 lb

## 2016-03-29 DIAGNOSIS — J40 Bronchitis, not specified as acute or chronic: Secondary | ICD-10-CM

## 2016-03-29 MED ORDER — IPRATROPIUM-ALBUTEROL 20-100 MCG/ACT IN AERS: 1.0000 | INHALATION_SPRAY | Freq: Four times a day (QID) | RESPIRATORY_TRACT | 5 refills | Status: DC | PRN

## 2016-03-29 MED ORDER — BENZONATATE 200 MG PO CAPS: 200.0000 mg | ORAL_CAPSULE | Freq: Three times a day (TID) | ORAL | 0 refills | Status: DC | PRN

## 2016-03-29 MED ORDER — AZITHROMYCIN 250 MG PO TABS: ORAL_TABLET | ORAL | 0 refills | Status: DC

## 2016-03-29 NOTE — Patient Instructions (Signed)

## 2016-03-29 NOTE — Progress Notes (Signed)
HPI: [name] is a 71 y.o. female who presents to [facility] 03/29/16 for chief complaint of:  Chief Complaint  Patient presents with  . Cough    Acute Illness: . Context: coughing, last week had cold-like symptoms . Location/Quality: feeling wheezing like it's moving into the chest    . Assoc signs/symptoms: see ROS . Duration: >7 days . Modifying factors: has tried the following OTC/Rx medications: OTC cold/flu formulations   Past medical, social and family history reviewed. Immune compromising conditions or other risk factors: HTN but no serious comorbid disease or history of respiratory problem  Current medications and allergies reviewed.     Review of Systems:  Constitutional: Yes  fever/chills  HEENT: Yes  headache, No  sore throat, No  swollen glands  Cardiovascular: No chest pain  Respiratory:Yes  cough, No  shortness of breath  Gastrointestinal: No  nausea, No  vomiting,  No  diarrhea  Musculoskeletal:   No  myalgia/arthralgia  Skin/Integument:  No  rash   Detailed Exam:  BP (!) 159/65   Pulse 83   Temp 98.1 F (36.7 C) (Oral)   Wt 203 lb (92.1 kg)   BMI 33.78 kg/m   Constitutional:   VSS, see above.   General Appearance: alert, well-developed, well-nourished, NAD  Eyes:   Normal lids and conjunctive, non-icteric sclera  Ears, Nose, Mouth, Throat:   Normal external inspection ears/nares  Normal mouth/lips/gums, MMM  normal TM  posterior pharynx without erythema, without exudate  nasal mucosa normal  Skin:  Normal inspection, no rash or concerning lesions noted on limited exam  Neck:   No masses, trachea midline. normal lymph nodes  Respiratory:   Normal respiratory effort.   Faint coarse breath sounds, no rhonchi/rales  Cardiovascular:   S1/S2 normal, no murmur/rub/gallop auscultated. RRR.    ASSESSMENT/PLAN: Mild abnormal breath sounds which get better with continued deep  breathing, however given chills and prolonged symptoms we'll go ahead and treat with antibiotics. Patient has had a flu shot this season  Bronchitis - Plan: benzonatate (TESSALON) 200 MG capsule, azithromycin (ZITHROMAX) 250 MG tablet, Ipratropium-Albuterol (COMBIVENT) 20-100 MCG/ACT AERS respimat     Patient Instructions  Acute Bronchitis, Adult Acute bronchitis is when air tubes (bronchi) in the lungs suddenly get swollen. The condition can make it hard to breathe. It can also cause these symptoms:  A cough.  Coughing up clear, yellow, or green mucus.  Wheezing.  Chest congestion.  Shortness of breath.  A fever.  Body aches.  Chills.  A sore throat. Follow these instructions at home: Medicines  Take over-the-counter and prescription medicines only as told by your doctor.  If you were prescribed an antibiotic medicine, take it as told by your doctor. Do not stop taking the antibiotic even if you start to feel better. General instructions  Rest.  Drink enough fluids to keep your pee (urine) clear or pale yellow.  Avoid smoking and secondhand smoke. If you smoke and you need help quitting, ask your doctor. Quitting will help your lungs heal faster.  Use an inhaler, cool mist vaporizer, or humidifier as told by your doctor.  Keep all follow-up visits as told by your doctor. This is important. How is this prevented? To lower your risk of getting this condition again:  Wash your hands often with soap and water. If you cannot use soap and water, use hand sanitizer.  Avoid contact with people who have cold symptoms.  Try not to touch your  hands to your mouth, nose, or eyes.  Make sure to get the flu shot every year. Contact a doctor if:  Your symptoms do not get better in 2 weeks. Get help right away if:  You cough up blood.  You have chest pain.  You have very bad shortness of breath.  You become dehydrated.  You faint (pass out) or keep feeling like you  are going to pass out.  You keep throwing up (vomiting).  You have a very bad headache.  Your fever or chills gets worse. This information is not intended to replace advice given to you by your health care provider. Make sure you discuss any questions you have with your health care provider. Document Released: 07/19/2007 Document Revised: 09/08/2015 Document Reviewed: 07/21/2015 Elsevier Interactive Patient Education  2017 [place].     Visit summary ws printed for the patient with medications and pertinent instructions for patient to review. ER/RTC precautions reviewed. All questions answered. Return if symptoms worsen or fail to improve.

## 2016-04-04 ENCOUNTER — Ambulatory Visit (INDEPENDENT_AMBULATORY_CARE_PROVIDER_SITE_OTHER): Payer: Medicare Other

## 2016-04-04 ENCOUNTER — Ambulatory Visit (INDEPENDENT_AMBULATORY_CARE_PROVIDER_SITE_OTHER): Payer: Medicare Other | Admitting: Osteopathic Medicine

## 2016-04-04 ENCOUNTER — Encounter: Payer: Self-pay | Admitting: Osteopathic Medicine

## 2016-04-04 VITALS — BP 134/65 | HR 94 | Ht 63.0 in | Wt 203.0 lb

## 2016-04-04 DIAGNOSIS — J40 Bronchitis, not specified as acute or chronic: Secondary | ICD-10-CM | POA: Diagnosis not present

## 2016-04-04 DIAGNOSIS — R05 Cough: Secondary | ICD-10-CM | POA: Diagnosis not present

## 2016-04-04 DIAGNOSIS — J019 Acute sinusitis, unspecified: Secondary | ICD-10-CM

## 2016-04-04 MED ORDER — DOXYCYCLINE HYCLATE 100 MG PO TABS: 100.0000 mg | ORAL_TABLET | Freq: Two times a day (BID) | ORAL | 0 refills | Status: DC

## 2016-04-04 NOTE — Progress Notes (Signed)
HPI: [name] is a 71 y.o. female who presents to [facility] 04/04/16 for chief complaint of:  Chief Complaint  Patient presents with  . Follow-up    BRONCHITIS    Acute Illness: . Context: coughing, 2 week ago had cold-like symptoms sinus symptoms, last week coughing/wheezing, now feels like still in ehst and throat, productive cough . Location/Quality: feeling wheezing like it's moving into the chest    . Assoc signs/symptoms: see ROS . Duration: >14 days . Modifying factors: has tried the following OTC/Rx medications: OTC cold/flu formulations. Treated with Z-pack last week, di dnot get nebulizer filled    Past medical, social and family history reviewed. Immune compromising conditions or other risk factors: HTN but no serious comorbid disease or history of respiratory problem  Current medications and allergies reviewed.     Review of Systems:  Constitutional: No fever/chills  HEENT: Yes  headache, No  sore throat, No  swollen glands, Yes sinus pressure  Cardiovascular: No chest pain  Respiratory:Yes  cough, No  shortness of breath    Detailed Exam:  BP 134/65   Pulse 94   Ht 5\' 3"  (1.6 m)   Wt 203 lb (92.1 kg)   BMI 35.96 kg/m   Constitutional:   VSS, see above.   General Appearance: alert, well-developed, well-nourished, NAD  Eyes:   Normal lids and conjunctive, non-icteric sclera  Ears, Nose, Mouth, Throat:   Normal external inspection ears/nares  Normal mouth/lips/gums, MMM  Neck:   No masses, trachea midline. normal lymph nodes  Respiratory:   Normal respiratory effort.   Faint coarse breath sounds, +wheeze RUL, no rhonchi/rales  Cardiovascular:   S1/S2 normal, no murmur/rub/gallop auscultated. RRR.   No results found.  Personal review of chest x-ray reveals nothing concerning, no mass/infiltrate noted.   ASSESSMENT/PLAN: Suspect possible lingering sinusitis, postviral cough syndrome also  possibility. Did not respond to azithromycin, I have low suspicion for serious bacterial infection but will cover for community-acquired pneumonia as well as persistent sinusitis just in case. Patient was advised of risks versus benefits of antibiotics and side effects that are possible, please let us know if any problems.  Bronchitis - Plan: DG Chest 2 View  Subacute sinusitis, unspecified location - Plan: doxycycline (VIBRA-TABS) 100 MG tablet    Visit summary ws printed for the patient with medications and pertinent instructions for patient to review. ER/RTC precautions reviewed. All questions answered. Return if symptoms worsen or fail to improve.

## 2016-04-06 ENCOUNTER — Other Ambulatory Visit: Payer: Self-pay

## 2016-04-06 ENCOUNTER — Other Ambulatory Visit: Payer: Self-pay | Admitting: Osteopathic Medicine

## 2016-04-06 DIAGNOSIS — F418 Other specified anxiety disorders: Secondary | ICD-10-CM

## 2016-04-06 DIAGNOSIS — I10 Essential (primary) hypertension: Secondary | ICD-10-CM

## 2016-04-06 MED ORDER — ATORVASTATIN CALCIUM 20 MG PO TABS: 20.0000 mg | ORAL_TABLET | Freq: Every day | ORAL | 3 refills | Status: DC

## 2016-04-06 NOTE — Telephone Encounter (Signed)
PATIENT REQUEST REFILL FOR ATORVASTAN 20 MG #90 3 REFILLS SENT TO [facility]. Rhonda Cunningham,CMA

## 2016-04-13 ENCOUNTER — Other Ambulatory Visit: Payer: Self-pay | Admitting: Osteopathic Medicine

## 2016-04-13 DIAGNOSIS — F5101 Primary insomnia: Secondary | ICD-10-CM

## 2016-04-25 ENCOUNTER — Institutional Professional Consult (permissible substitution): Payer: Self-pay | Admitting: Pulmonary Disease

## 2016-05-04 ENCOUNTER — Other Ambulatory Visit: Payer: Self-pay | Admitting: Osteopathic Medicine

## 2016-05-04 DIAGNOSIS — F418 Other specified anxiety disorders: Secondary | ICD-10-CM

## 2016-05-10 ENCOUNTER — Encounter: Payer: Self-pay | Admitting: Osteopathic Medicine

## 2016-05-10 ENCOUNTER — Other Ambulatory Visit: Payer: Self-pay | Admitting: Osteopathic Medicine

## 2016-05-10 DIAGNOSIS — F5101 Primary insomnia: Secondary | ICD-10-CM

## 2016-05-11 ENCOUNTER — Other Ambulatory Visit: Payer: Self-pay | Admitting: Osteopathic Medicine

## 2016-05-11 DIAGNOSIS — F5101 Primary insomnia: Secondary | ICD-10-CM

## 2016-05-26 ENCOUNTER — Encounter: Payer: Self-pay | Admitting: Osteopathic Medicine

## 2016-05-26 MED ORDER — ZOLPIDEM TARTRATE 10 MG PO TABS: 5.0000 mg | ORAL_TABLET | Freq: Every evening | ORAL | 0 refills | Status: DC | PRN

## 2016-06-04 ENCOUNTER — Other Ambulatory Visit: Payer: Self-pay | Admitting: Osteopathic Medicine

## 2016-06-04 DIAGNOSIS — F418 Other specified anxiety disorders: Secondary | ICD-10-CM

## 2016-06-05 ENCOUNTER — Other Ambulatory Visit: Payer: Self-pay | Admitting: Osteopathic Medicine

## 2016-06-05 DIAGNOSIS — F418 Other specified anxiety disorders: Secondary | ICD-10-CM

## 2016-06-06 ENCOUNTER — Other Ambulatory Visit: Payer: Self-pay | Admitting: Osteopathic Medicine

## 2016-06-06 DIAGNOSIS — F418 Other specified anxiety disorders: Secondary | ICD-10-CM

## 2016-06-09 ENCOUNTER — Ambulatory Visit: Payer: Self-pay | Admitting: Osteopathic Medicine

## 2016-06-11 ENCOUNTER — Other Ambulatory Visit: Payer: Self-pay | Admitting: Osteopathic Medicine

## 2016-06-11 DIAGNOSIS — F5101 Primary insomnia: Secondary | ICD-10-CM

## 2016-06-12 ENCOUNTER — Encounter: Payer: Self-pay | Admitting: Osteopathic Medicine

## 2016-06-12 ENCOUNTER — Ambulatory Visit (INDEPENDENT_AMBULATORY_CARE_PROVIDER_SITE_OTHER): Payer: Medicare Other | Admitting: Osteopathic Medicine

## 2016-06-12 VITALS — BP 155/67 | HR 86 | Ht 63.0 in | Wt 198.0 lb

## 2016-06-12 DIAGNOSIS — Z1211 Encounter for screening for malignant neoplasm of colon: Secondary | ICD-10-CM

## 2016-06-12 DIAGNOSIS — I10 Essential (primary) hypertension: Secondary | ICD-10-CM

## 2016-06-12 DIAGNOSIS — F5101 Primary insomnia: Secondary | ICD-10-CM | POA: Diagnosis not present

## 2016-06-12 DIAGNOSIS — F321 Major depressive disorder, single episode, moderate: Secondary | ICD-10-CM

## 2016-06-12 MED ORDER — MIRTAZAPINE 15 MG PO TABS: 7.5000 mg | ORAL_TABLET | Freq: Every day | ORAL | 1 refills | Status: DC

## 2016-06-12 NOTE — Progress Notes (Signed)
HPI: [name] is a 71 y.o. female  who presents to [facility] today, 06/12/16,  for chief complaint of:  Chief Complaint  Patient presents with  . Depression    Insomnia: Temazepam had quit working, we put her back on Ambien again which she used for a week and this helped but she is back on the Temazepam qhs since the Ambien causes daytime drowsiness. She remembers being on Mirtazapine in the past which helped a bit too but caused weight gain, she would like to get back on that medication if possible. Reports that Ambien was working for a few nights but then she has been waking up in the middle of the night and having panic problems, not able to get back to sleep. Had to cancel recent appt w/ psych but is set up to see them next month. Going on vacation to [place] soon and is very anxious about this trip.   History and previous evaluation - Long-standing insomnia, ongoing since patient was in her 80s. Sleep onset as well as maintenance issues. Stress exacerbates these. She gets obsessive thoughts also about her insomnia to where she worries that she doesn't sleep, so she sleeps even worse. Has been hospitalized for this issue in the 1980s/1990s. At that time was placed on temazepam and clonazepam. Previously following with psychiatry.   A lot of anxiety surrounding insomnia issues. Patient gets anxious about not sleeping, and then his anxiety seems to keep her awake, unfortunate cycle/positive feedback loop ensues.  Since seeing me...  Temazepam wasn't working as well as it used to, so at 12/10/15 visit we initiated suvorexant but pt unable to tolerate the side effects of this so we initiated Ambien.   Followed up with psychology/therapist 12/14/15 for CBT insomnia and other OCD. Has been following since and found therapy moderately helpful  Sleep specialist referral was ordered 12/16/2015, has appointment upcoming in 02/2016  01/18/16 tried Ambien CR,  was finding she was waking up even on this and taking a second one and finding there were problems with grogginess when she took two, so she went back to regular ambien. Requests to get back on the Temazepam at 02/15/15 visit  Depression: She has been using counseling/behavioral therapy techniques but these aren't helping much. Recognizes need for help. Reports she sometimes feels like with this insomnia isn't worth living but she has no intention or thoughts of suicide, just desperate for help.   Previously on Prozac, then stopped  (+)FH depression and mood problems.   Preivously on Mitrazapine but gained weight on this.   Currently on effexor, clonazepam. Psych referral placed 12/07/15 for CBT for this and insomnia, has helped somewhat.       Past medical, surgical, social and family history reviewed: Past Medical History:  Diagnosis Date  . Anxiety   . Depression   . Headache    SINUS  . Hyperlipidemia   . Hypertension   . Osteopenia   . Primary localized osteoarthritis of right knee    Past Surgical History:  Procedure Laterality Date  . BREAST LUMPECTOMY Right 1997   Done in [place]  . CARPAL TUNNEL RELEASE Right 2015   Ortho in [place]  . LASIK    . MENISCUS REPAIR Right 2011   Orthopedic in [place]  . TOTAL KNEE ARTHROPLASTY Right 09/20/2015  . TOTAL KNEE ARTHROPLASTY Right 09/20/2015   Procedure: TOTAL KNEE ARTHROPLASTY;  Surgeon: Elsie Saas, MD;  Location: [facility];  Service: Orthopedics;  Laterality:  Right;  . Trigger Thumb Right 2015   Done in [facility] at the same time as the carpal tunnel release  . TUBAL LIGATION  1980   Social History  Substance Use Topics  . Smoking status: Never Smoker  . Smokeless tobacco: Never Used  . Alcohol use Yes     Comment: 1 glass a month   Family History  Problem Relation Age of Onset  . Heart failure Mother   . Heart attack Mother   . Alcohol abuse Maternal Aunt   . Heart attack Father   . Irregular heart beat Sister    . Hypertension Sister   . Heart disease Brother      Current medication list and allergy/intolerance information reviewed:   Current Outpatient Prescriptions on File Prior to Visit  Medication Sig Dispense Refill  . amLODipine-benazepril (LOTREL) 5-20 MG capsule TAKE ONE CAPSULE BY MOUTH DAILY 90 capsule 0  . atorvastatin (LIPITOR) 20 MG tablet Take 1 tablet (20 mg total) by mouth daily. 90 tablet 3  . clonazePAM (KLONOPIN) 0.5 MG tablet TAKE ONE TABLET BY MOUTH TWO TIMES A DAY AS NEEDED FOR ANXIETY. DO NOT TAKE WITH TEMAZEPAM 60 tablet 0  . fluticasone (FLONASE) 50 MCG/ACT nasal spray Place into both nostrils daily.    . raloxifene (EVISTA) 60 MG tablet Take 1 tablet (60 mg total) by mouth daily. 90 tablet 3  . temazepam (RESTORIL) 30 MG capsule TAKE ONE CAPSULE BY MOUTH AT BEDTIME AS NEEDED FOR SLEEP. DO NOT TAKE WITH CLONAZEPAM. 30 capsule 0  . triamterene-hydrochlorothiazide (DYAZIDE) 37.5-25 MG capsule Take 1 each (1 capsule total) by mouth daily. 90 capsule 3  . venlafaxine XR (EFFEXOR-XR) 150 MG 24 hr capsule Take 1 capsule (150 mg total) by mouth daily with breakfast. 90 capsule 3  . zolpidem (AMBIEN) 10 MG tablet Take 0.5-1 tablets (5-10 mg total) by mouth at bedtime as needed for sleep. DO NOT take with Temazepam or Clonazepam 30 tablet 0   No current facility-administered medications on file prior to visit.    Allergies  Allergen Reactions  . Penicillins Other (See Comments)    UNSPECIFIED   . Sulfa Antibiotics Other (See Comments)    UNSPECIFIED       Review of Systems:  Constitutional: No recent illness  Cardiac: No  chest pain, No  pressure, No palpitations  Respiratory:  No  shortness of breath. No  Cough  Neurologic: No  weakness, No  Dizziness  Psychiatric: +concerns with depression, +concerns with anxiety, +sleep problems  Exam:  BP (!) 155/67   Pulse 86   Ht 5\' 3"  (1.6 m)   Wt 198 lb (89.8 kg)   BMI 35.07 kg/m   Constitutional: VS see above.  General Appearance: alert, well-developed, well-nourished, NAD  Neck: No masses, trachea midline.   Respiratory: Normal respiratory effort.    Neurological: Normal balance/coordination. No tremor.  Skin: warm, dry, intact.   Psychiatric: Normal judgment/insight. Anxious mood and affect. Oriented x3.      ASSESSMENT/PLAN: Has appt w/ psych set up. Ok to continue following regimen:  Effexor daily, adding Mirtazapine at low dose w caution - pt educated on s/s serotonin syndrome  Clonazepam in daytime, Tempazepam OR Ambien qhs - pt is aware not to take both together, no hx accidental or intentional OD  Blood pressure borderline, patient reports significant stress and lack of sleep, will address further at future visit. May need to consider home blood pressure cuff.   Depression, major, single episode, moderate (  [place]) - Plan: mirtazapine (REMERON) 15 MG tablet  Primary insomnia  Colon cancer screening - Plan: Cologuard  Essential hypertension      Visit summary with medication list and pertinent instructions was printed for patient to review. All questions at time of visit were answered - patient instructed to contact office with any additional concerns. ER/RTC precautions were reviewed with the patient. Follow-up plan: Return if symptoms worsen or fail to improve.

## 2016-06-12 NOTE — Patient Instructions (Signed)
Serotonin Syndrome Serotonin is a brain chemical that regulates the nervous system, which includes the brain, spinal cord, and nerves. Serotonin appears to play a role in all types of behavior, including appetite, emotions, movement, thinking, and response to stress. Excessively high levels of serotonin in the body can cause serotonin syndrome, which is a very dangerous condition. What are the causes? This condition can be caused by taking medicines or drugs that increase the level of serotonin in your body. These include:  Antidepressant medicines.  Migraine medicines.  Certain pain medicines.  Certain recreational drugs, including ecstasy, LSD, cocaine, and amphetamines.  Over-the-counter cough or cold medicines that contain dextromethorphan.  Certain herbal supplements, including St. John's wort, ginseng, and nutmeg. This condition usually occurs when you take these medicines or drugs in combination, but it can also happen with a high dose of a single medicine or drug. What increases the risk? This condition is more likely to develop in:  People who have recently increased the dosage of medicine that increases the serotonin level.  People who just started taking medicine that increases the serotonin level. What are the signs or symptoms? Symptoms of this condition usually happens within several hours of a medicine change. Symptoms include:  Headache.  Muscle twitching or stiffness.  Diarrhea.  Confusion.  Restlessness or agitation.  Shivering or goose bumps.  Loss of muscle coordination.  Rapid heart rate.  Sweating. Severe cases of serotonin syndromecan cause:  Irregular heartbeat.  Seizures.  Loss of consciousness.  High fever. How is this diagnosed? This condition is diagnosed with a medical history and physical exam. You will be asked aboutyour symptoms and your use of medicines and recreational drugs. Your health care provider may also order lab work or  additional tests to rule out other causes of your symptoms. How is this treated? The treatment for this condition depends on the severity of your symptoms. For mild cases, stopping the medicine that caused your condition is usually all that is needed. For moderate to severe cases, hospitalization is required to monitor you and to prevent further muscle damage. Follow these instructions at home:  Take over-the-counter and prescription medicines only as told by your health care provider. This is important.  Check with your health care provider before you start taking any new prescriptions, over-the-counter medicines, herbs, or supplements.  Avoid combining any medicines that can cause this condition to occur.  Keep all follow-up visits as told by your health care provider.This is important.  Maintain a healthy lifestyle.  Eat healthy foods.  Get plenty of sleep.  Exercise regularly.  Do not drink alcohol.  Do not use recreational drugs. Contact a health care provider if:  Medicines do not seem to be helping.  Your symptoms do not improve or they get worse.  You have trouble taking care of yourself. Get help right away if:  You have worsening confusion, severe headache, chest pain, high fever, seizures, or loss of consciousness.  You have serious thoughts about hurting yourself or others.  You experience serious side effects of medicine, such as swelling of your face, lips, tongue, or throat. This information is not intended to replace advice given to you by your health care provider. Make sure you discuss any questions you have with your health care provider. Document Released: 03/09/2004 Document Revised: 09/25/2015 Document Reviewed: 02/12/2014 Elsevier Interactive Patient Education  2017 [employer]  

## 2016-07-02 ENCOUNTER — Other Ambulatory Visit: Payer: Self-pay | Admitting: Osteopathic Medicine

## 2016-07-02 DIAGNOSIS — F418 Other specified anxiety disorders: Secondary | ICD-10-CM

## 2016-07-03 ENCOUNTER — Other Ambulatory Visit: Payer: Self-pay | Admitting: Osteopathic Medicine

## 2016-07-03 DIAGNOSIS — I10 Essential (primary) hypertension: Secondary | ICD-10-CM

## 2016-07-04 DIAGNOSIS — Z1211 Encounter for screening for malignant neoplasm of colon: Secondary | ICD-10-CM | POA: Diagnosis not present

## 2016-07-04 DIAGNOSIS — Z1212 Encounter for screening for malignant neoplasm of rectum: Secondary | ICD-10-CM | POA: Diagnosis not present

## 2016-07-05 ENCOUNTER — Ambulatory Visit (INDEPENDENT_AMBULATORY_CARE_PROVIDER_SITE_OTHER): Payer: Medicare Other | Admitting: Psychiatry

## 2016-07-05 ENCOUNTER — Encounter (HOSPITAL_COMMUNITY): Payer: Self-pay | Admitting: Psychiatry

## 2016-07-05 VITALS — BP 124/70 | HR 88 | Resp 16 | Ht 62.0 in | Wt 200.0 lb

## 2016-07-05 DIAGNOSIS — Z818 Family history of other mental and behavioral disorders: Secondary | ICD-10-CM

## 2016-07-05 DIAGNOSIS — Z880 Allergy status to penicillin: Secondary | ICD-10-CM | POA: Diagnosis not present

## 2016-07-05 DIAGNOSIS — F419 Anxiety disorder, unspecified: Secondary | ICD-10-CM | POA: Diagnosis not present

## 2016-07-05 DIAGNOSIS — F418 Other specified anxiety disorders: Secondary | ICD-10-CM

## 2016-07-05 DIAGNOSIS — F5105 Insomnia due to other mental disorder: Secondary | ICD-10-CM

## 2016-07-05 DIAGNOSIS — Z882 Allergy status to sulfonamides status: Secondary | ICD-10-CM

## 2016-07-05 DIAGNOSIS — Z79899 Other long term (current) drug therapy: Secondary | ICD-10-CM | POA: Diagnosis not present

## 2016-07-05 DIAGNOSIS — F320 Major depressive disorder, single episode, mild: Secondary | ICD-10-CM | POA: Diagnosis not present

## 2016-07-05 MED ORDER — BUSPIRONE HCL 7.5 MG PO TABS: 7.5000 mg | ORAL_TABLET | Freq: Three times a day (TID) | ORAL | 0 refills | Status: DC

## 2016-07-05 NOTE — Patient Instructions (Signed)
Avoid reading in bed or lying down Worry time during the day Warm milk and meditation Refer to sleep study

## 2016-07-05 NOTE — Progress Notes (Signed)
Psychiatric Initial Adult Assessment   Patient Identification: [name] MRN:  [id number] Date of Evaluation:  07/05/2016 Referral Source: primary care Chief Complaint:   Chief Complaint    Establish Care     Visit Diagnosis:    ICD-9-CM ICD-10-CM   1. Anxiety associated with depression 300.4 F41.8   2. Hyposomnia, insomnia or sleeplessness associated with anxiety 293.84 F51.05    327.02 F41.9     History of Present Illness:  71 years old married Caucasian female referred by primary care physician for management of anxiety and insomnia  Patient has had insomnia for the last 40-50 years has difficulty falling asleep mind races at nighttime she worries about things and then she worries mostly about her sleep and if she can't sleep much she worries more about it the next day other anxiety is related to difficult relationship with her sister who went crossroad due to trying to get money out of her mom's account when she was in the nursing home She is otherwise happily married supportive husband who has been alarmed at the both are retired There has been history of depression and anxiety but mostly her depression is also related not able to sleep and then havinga miserable day next time Have tried different meds, currently on temazapam, effexor and remeron. 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 She is worried about having a sleep study that he recommend so that we can rule out any of the possibility for insomnia During the day she keeps herself busy Working in therapy  Modifying factor: husband. Son   Associated Signs/Symptoms: Depression Symptoms:  insomnia, fatigue, (Hypo) Manic Symptoms:  Distractibility, Anxiety Symptoms:  Excessive Worry, Psychotic Symptoms:  denies PTSD Symptoms: NA  Past Psychiatric History: anxiety, insomnia   Previous Psychotropic  Medications: Yes   Substance Abuse History in the last 12 months:  No.  Consequences of Substance Abuse: NA  Past Medical History:  Past Medical History:  Diagnosis Date  . Anxiety   . Depression   . Headache    SINUS  . Hyperlipidemia   . Hypertension   . Osteopenia   . Primary localized osteoarthritis of right knee     Past Surgical History:  Procedure Laterality Date  . BREAST LUMPECTOMY Right 1997   Done in [place]  . CARPAL TUNNEL RELEASE Right 2015   Ortho in [place]  . LASIK    . MENISCUS REPAIR Right 2011   Orthopedic in [place]  . TOTAL KNEE ARTHROPLASTY Right 09/20/2015  . TOTAL KNEE ARTHROPLASTY Right 09/20/2015   Procedure: TOTAL KNEE ARTHROPLASTY;  Surgeon: Elsie Saas, MD;  Location: [facility];  Service: Orthopedics;  Laterality: Right;  . Trigger Thumb Right 2015   Done in [facility] at the same time as the carpal tunnel release  . TUBAL LIGATION  1980    Family Psychiatric History: mom : anxiety, insomnia. worries  Family History:  Family History  Problem Relation Age of Onset  . Heart failure Mother   . Heart attack Mother   . Alcohol abuse Maternal Aunt   . Heart attack Father   . Irregular heart beat Sister   . Hypertension Sister   . Heart disease Brother     Social History:   Social History   Social History  . Marital status: Married    Spouse name: N/A  . Number of children: N/A  . Years of  education: N/A   Occupational History  . retired     Licensed conveyancer   Social History Main Topics  . Smoking status: Never Smoker  . Smokeless tobacco: Never Used  . Alcohol use No     Comment: 1 glass a month  . Drug use: No  . Sexual activity: Not Currently   Other Topics Concern  . None   Social History Narrative  . None    Additional Social History: grew up with her parents no trauma or abuse. Married for 50 years. One grown son. Retired as a a Licensed conveyancer. No legal issues   Allergies:   Allergies  Allergen Reactions  . Penicillins  Other (See Comments)    UNSPECIFIED   . Sulfa Antibiotics Other (See Comments)    UNSPECIFIED     Metabolic Disorder Labs: No results found for: HGBA1C, MPG No results found for: PROLACTIN Lab Results  Component Value Date   CHOL 132 06/21/2014   HDL 51 06/21/2014   LDLCALC 61 06/21/2014     Current Medications: Current Outpatient Prescriptions  Medication Sig Dispense Refill  . amLODipine-benazepril (LOTREL) 5-20 MG capsule TAKE ONE CAPSULE BY MOUTH DAILY 90 capsule 0  . atorvastatin (LIPITOR) 20 MG tablet Take 1 tablet (20 mg total) by mouth daily. 90 tablet 3  . clonazePAM (KLONOPIN) 0.5 MG tablet TAKE ONE TABLET BY MOUTH TWO TIMES A DAY AS NEEDED FOR ANXIETY. DO NOT TAKE WITH TEMAZEPAM 60 tablet 0  . fluticasone (FLONASE) 50 MCG/ACT nasal spray Place into both nostrils daily.    . mirtazapine (REMERON) 15 MG tablet Take 0.5-1 tablets (7.5-15 mg total) by mouth at bedtime. 30 tablet 1  . raloxifene (EVISTA) 60 MG tablet Take 1 tablet (60 mg total) by mouth daily. 90 tablet 3  . temazepam (RESTORIL) 30 MG capsule TAKE ONE CAPSULE BY MOUTH AT BEDTIME AS NEEDED FOR SLEEP. DO NOT TAKE WITH CLONAZEPAM. 30 capsule 0  . triamterene-hydrochlorothiazide (DYAZIDE) 37.5-25 MG capsule Take 1 each (1 capsule total) by mouth daily. 90 capsule 3  . venlafaxine XR (EFFEXOR-XR) 150 MG 24 hr capsule Take 1 capsule (150 mg total) by mouth daily with breakfast. 90 capsule 3  . busPIRone (BUSPAR) 7.5 MG tablet Take 1 tablet (7.5 mg total) by mouth 3 (three) times daily. 30 tablet 0   No current facility-administered medications for this visit.     Neurologic: Headache: No Seizure: No Paresthesias:No  Musculoskeletal: Strength & Muscle Tone: within normal limits Gait & Station: normal Patient leans: no lean  Psychiatric Specialty Exam: Review of Systems  Cardiovascular: Negative for chest pain.  Skin: Negative for rash.  Psychiatric/Behavioral: The patient is nervous/anxious and has  insomnia.     Blood pressure 124/70, pulse 88, resp. rate 16, height 5\' 2"  (1.575 m), weight 200 lb (90.7 kg), SpO2 98 %.Body mass index is 36.58 kg/m.  General Appearance: Casual  Eye Contact:  Fair  Speech:  Normal Rate  Volume:  Normal  Mood:  Anxious  Affect:  Congruent  Thought Process:  Goal Directed  Orientation:  Full (Time, Place, and Person)  Thought Content:  Rumination  Suicidal Thoughts:  No  Homicidal Thoughts:  No  Memory:  Immediate;   Fair Recent;   Fair  Judgement:  Fair  Insight:  Fair  Psychomotor Activity:  Decreased  Concentration:  Concentration: Fair and Attention Span: Fair  Recall:  [employer] of Knowledge:Fair  Language: Fair  Akathisia:  Negative  Handed:  Right  AIMS (if  indicated):    Assets:  Desire for Improvement  ADL's:  Intact  Cognition: WNL  Sleep:  poor    Treatment Plan Summary: Medication management and Plan as follows  1. Anxiety with depression/ major depressive disorder mild: continue effexor, remeron 2. Insomnia. Primary: continue restoril Reviewed sleep hygiene in detail Avoid lying in bed and readin Refer to sleep study to rule out sleep apnea Assign worry time during the  Day Add buspirone 7.5mg  qd prn or half bid for anxiety  Continue therapy FU 4 weeks. Patient need to work on changing the habits to help her sleep    Merian Capron, MD 5/23/20189:39 AM

## 2016-07-09 LAB — COLOGUARD: Cologuard: NEGATIVE

## 2016-07-10 ENCOUNTER — Other Ambulatory Visit: Payer: Self-pay | Admitting: Osteopathic Medicine

## 2016-07-10 DIAGNOSIS — F5101 Primary insomnia: Secondary | ICD-10-CM

## 2016-07-13 ENCOUNTER — Other Ambulatory Visit (HOSPITAL_COMMUNITY): Payer: Self-pay | Admitting: Psychiatry

## 2016-07-17 ENCOUNTER — Encounter: Payer: Self-pay | Admitting: Pulmonary Disease

## 2016-07-17 ENCOUNTER — Telehealth (HOSPITAL_COMMUNITY): Payer: Self-pay | Admitting: Psychiatry

## 2016-07-17 MED ORDER — BUSPIRONE HCL 7.5 MG PO TABS: 7.5000 mg | ORAL_TABLET | Freq: Every day | ORAL | 0 refills | Status: DC

## 2016-07-17 NOTE — Telephone Encounter (Signed)
Have prescribed last visit 7.5mg  buspar prn . Can send again if not received

## 2016-07-17 NOTE — Telephone Encounter (Signed)
Pt needs refill on buspar.  Pharmacy sent request on 5/31 and has not heard anything.   Please advise.

## 2016-07-17 NOTE — Telephone Encounter (Signed)
Per Dr. De Nurse, Buspar 7.5mg , #30 was authorize. Prescription was sent to pharmacy. Called and informed patient of refill status. Patient's next apt is schedule on 07/24/16. Patient verbalizes understanding.

## 2016-07-20 ENCOUNTER — Ambulatory Visit (INDEPENDENT_AMBULATORY_CARE_PROVIDER_SITE_OTHER): Payer: Medicare Other | Admitting: Pulmonary Disease

## 2016-07-20 ENCOUNTER — Encounter: Payer: Self-pay | Admitting: Pulmonary Disease

## 2016-07-20 VITALS — BP 132/74 | HR 85 | Ht 62.0 in | Wt 201.2 lb

## 2016-07-20 DIAGNOSIS — G4700 Insomnia, unspecified: Secondary | ICD-10-CM | POA: Diagnosis not present

## 2016-07-20 DIAGNOSIS — F5105 Insomnia due to other mental disorder: Secondary | ICD-10-CM

## 2016-07-20 DIAGNOSIS — F419 Anxiety disorder, unspecified: Secondary | ICD-10-CM | POA: Diagnosis not present

## 2016-07-20 DIAGNOSIS — R0683 Snoring: Secondary | ICD-10-CM

## 2016-07-20 MED ORDER — FLUTICASONE-UMECLIDIN-VILANT 100-62.5-25 MCG/INH IN AEPB: 1.0000 | INHALATION_SPRAY | Freq: Every day | RESPIRATORY_TRACT | 0 refills | Status: DC

## 2016-07-20 NOTE — Patient Instructions (Signed)
Home sleep study will be scheduled   Rules of sleep hygiene were discussed  - light exercise -avoid caffeinated beverages - no more than 20 mins staying awake in bed, if not asleep, get out of bed & reading or light music - No TV or computer games at bedtime.  -Trial of melatonin 3-5 mg at bedtime  Referral to cognitive behavioral therapist for sleep restriction therapy

## 2016-07-20 NOTE — Assessment & Plan Note (Signed)
Predominantly, psychophysiologic-long-standing mostly insomnia of sleep onset,     Rules of sleep hygiene were discussed  - light exercise -avoid caffeinated beverages - no more than 20 mins staying awake in bed, if not asleep, get out of bed & reading or light music - No TV or computer games at bedtime.  -Trial of melatonin 3-5 mg at bedtime  Referral to cognitive behavioral therapist for sleep restriction therapy

## 2016-07-20 NOTE — Assessment & Plan Note (Signed)
Given , narrow pharyngeal exam, witnessed apneas & loud snoring, obstructive sleep apnea is possible & an overnight polysomnogram will be scheduled as a home study. The pathophysiology of obstructive sleep apnea , it's cardiovascular consequences & modes of treatment including CPAP were discused with the patient in detail & they evidenced understanding.

## 2016-07-20 NOTE — Progress Notes (Signed)
Subjective:    Patient ID: [name], female    DOB: 1945-05-21, 71 y.o.   MRN: [id number]  HPI  71 year old hypertensive presents for evaluation of insomnia and snoring. She has been referred by her psychiatrist for evaluation of obstructive sleep apnea with a sleep study. She reports long-standing anxiety and insomnia since her 4s. She relates this to stressors at work when she was working and Training and development officer in [place]. 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 She underwent total knee replacement in 09/2015. She has been on temazepam for more than 10 years due to insomnia of sleep onset. She is maintained on Remeron for depression and also clonazepam as listed for anxiety on her medications as she does not seem to be taking this on a daily basis. She denies excessive daytime somnolence, Epworth sleepiness score is 2. Bedtime is between 11 PM and midnight, sleep latency is variable and long sometimes lasting hours. She does have a TV in the bedroom and greens in bed. She sleeps on her side side with one pillow, reports usually one bathroom visit and is out of bed by 9 AM although many times she is awake by 8 AM but stays in bed. She does have a long period of uninterrupted sleep when she finally is able to fall asleep. She denies dryness of mouth, headaches on waking up. She's gained about 15 pounds to her current weight of 201 pounds. Her husband has OSA and uses a CPAP machine and has noted loud snoring and occasional pauses in her breathing  There is no history suggestive of cataplexy, sleep paralysis or parasomnias  She admits to a sedentary lifestyle   Past Medical History:  Diagnosis Date  . Anxiety   . Depression   . Headache    SINUS  . Hyperlipidemia   . Hypertension   . Osteopenia   . Primary localized osteoarthritis of right knee    Past Surgical History:  Procedure Laterality Date  .  BREAST LUMPECTOMY Right 1997   Done in [place]  . CARPAL TUNNEL RELEASE Right 2015   Ortho in [place]  . LASIK    . MENISCUS REPAIR Right 2011   Orthopedic in [place]  . TOTAL KNEE ARTHROPLASTY Right 09/20/2015  . TOTAL KNEE ARTHROPLASTY Right 09/20/2015   Procedure: TOTAL KNEE ARTHROPLASTY;  Surgeon: Elsie Saas, MD;  Location: [facility];  Service: Orthopedics;  Laterality: Right;  . Trigger Thumb Right 2015   Done in [facility] at the same time as the carpal tunnel release  . TUBAL LIGATION  1980    Allergies  Allergen Reactions  . Penicillins Other (See Comments)    UNSPECIFIED   . Sulfa Antibiotics Other (See Comments)    UNSPECIFIED       Social History   Social History  . Marital status: Married    Spouse name: N/A  . Number of children: N/A  . Years of education: N/A   Occupational History  . retired     Licensed conveyancer   Social History Main Topics  . Smoking status: Never Smoker  . Smokeless tobacco: Never Used  . Alcohol use No     Comment: 1 glass a month  . Drug use: No  . Sexual activity: Not Currently   Other Topics Concern  . Not on file   Social History Narrative  . No narrative on file  Family History  Problem Relation Age of Onset  . Heart failure Mother   . Heart attack Mother   . Alcohol abuse Maternal Aunt   . Heart attack Father   . Irregular heart beat Sister   . Hypertension Sister   . Heart disease Brother      Review of Systems Constitutional: negative for anorexia, fevers and sweats  Eyes: negative for irritation, redness and visual disturbance  Ears, nose, mouth, throat, and face: negative for earaches, epistaxis, nasal congestion and sore throat  Respiratory: negative for cough, dyspnea on exertion, sputum and wheezing  Cardiovascular: negative for chest pain, dyspnea, lower extremity edema, orthopnea, palpitations and syncope  Gastrointestinal: negative for abdominal pain, constipation, diarrhea, melena, nausea and  vomiting  Genitourinary:negative for dysuria, frequency and hematuria  Hematologic/lymphatic: negative for bleeding, easy bruising and lymphadenopathy  Musculoskeletal:negative for arthralgias, muscle weakness and stiff joints  Neurological: negative for coordination problems, gait problems, headaches and weakness  Endocrine: negative for diabetic symptoms including polydipsia, polyuria and weight loss     Objective:   Physical Exam  Gen. Pleasant, obese, in no distress, normal affect ENT - no lesions, no post nasal drip, class 2 airway Neck: No JVD, no thyromegaly, no carotid bruits Lungs: no use of accessory muscles, no dullness to percussion, decreased without rales or rhonchi  Cardiovascular: Rhythm regular, heart sounds  normal, no murmurs or gallops, no peripheral edema Abdomen: soft and non-tender, no hepatosplenomegaly, BS normal. Musculoskeletal: No deformities, no cyanosis or clubbing Neuro:  alert, non focal, no tremors       Assessment & Plan:

## 2016-07-21 ENCOUNTER — Encounter: Payer: Self-pay | Admitting: Osteopathic Medicine

## 2016-07-21 NOTE — Progress Notes (Signed)
07/09/2016 

## 2016-07-24 ENCOUNTER — Ambulatory Visit (INDEPENDENT_AMBULATORY_CARE_PROVIDER_SITE_OTHER): Payer: Medicare Other | Admitting: Psychiatry

## 2016-07-24 ENCOUNTER — Encounter (HOSPITAL_COMMUNITY): Payer: Self-pay | Admitting: Psychiatry

## 2016-07-24 VITALS — BP 120/72 | HR 100 | Resp 18 | Ht 62.0 in | Wt 200.0 lb

## 2016-07-24 DIAGNOSIS — F418 Other specified anxiety disorders: Secondary | ICD-10-CM | POA: Diagnosis not present

## 2016-07-24 DIAGNOSIS — Z818 Family history of other mental and behavioral disorders: Secondary | ICD-10-CM

## 2016-07-24 DIAGNOSIS — F5105 Insomnia due to other mental disorder: Secondary | ICD-10-CM | POA: Diagnosis not present

## 2016-07-24 DIAGNOSIS — F329 Major depressive disorder, single episode, unspecified: Secondary | ICD-10-CM

## 2016-07-24 DIAGNOSIS — F419 Anxiety disorder, unspecified: Secondary | ICD-10-CM

## 2016-07-24 MED ORDER — BUSPIRONE HCL 7.5 MG PO TABS: 7.5000 mg | ORAL_TABLET | Freq: Every day | ORAL | 1 refills | Status: DC

## 2016-07-24 NOTE — Progress Notes (Signed)
[facility] Outpatient Follow up visit   Patient Identification: [name] MRN:  [id number] Date of Evaluation:  07/24/2016 Referral Source: primary care Chief Complaint:   Chief Complaint    Follow-up     Visit Diagnosis:    ICD-10-CM   1. Anxiety associated with depression F41.8   2. Hyposomnia, insomnia or sleeplessness associated with anxiety F51.05    F41.9     History of Present Illness:  71 years old married Caucasian female initially referred by primary care physician for management of anxiety and insomnia  As per initial note " Patient has had insomnia for the last 40-50 years has difficulty falling asleep mind races at nighttime she worries about things and then she worries mostly about her sleep and if she can't sleep "  We talked about sleep hygiene in detail referred her for a sleep study. 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  Consequences of Substance Abuse: NA  Past Medical History:  Past Medical History:  Diagnosis Date  . Anxiety   . Depression   . Headache    SINUS  . Hyperlipidemia   . Hypertension   . Osteopenia   . Primary localized osteoarthritis of right knee     Past Surgical History:  Procedure Laterality Date  . BREAST LUMPECTOMY Right 1997   Done in [place]  . CARPAL TUNNEL RELEASE Right 2015   Ortho in [place]  . LASIK    . MENISCUS REPAIR Right 2011   Orthopedic in [place]  . TOTAL KNEE ARTHROPLASTY Right 09/20/2015  . TOTAL KNEE  ARTHROPLASTY Right 09/20/2015   Procedure: TOTAL KNEE ARTHROPLASTY;  Surgeon: Elsie Saas, MD;  Location: [facility];  Service: Orthopedics;  Laterality: Right;  . Trigger Thumb Right 2015   Done in [facility] at the same time as the carpal tunnel release  . TUBAL LIGATION  1980    Family Psychiatric History: mom : anxiety, insomnia. worries  Family History:  Family History  Problem Relation Age of Onset  . Heart failure Mother   . Heart attack Mother   . Alcohol abuse Maternal Aunt   . Heart attack Father   . Irregular heart beat Sister   . Hypertension Sister   . Heart disease Brother     Social History:   Social History   Social History  . Marital status: Married    Spouse name: N/A  . Number of children: N/A  . Years of education: N/A   Occupational History  . retired     Licensed conveyancer   Social History Main Topics  . Smoking status: Never Smoker  . Smokeless tobacco: Never Used  . Alcohol use No     Comment: 1 glass a month  . Drug use: No  . Sexual activity: Not Currently   Other Topics Concern  . None   Social History Narrative  . None      Allergies:   Allergies  Allergen Reactions  . Penicillins Other (  See Comments)    UNSPECIFIED   . Sulfa Antibiotics Other (See Comments)    UNSPECIFIED     Metabolic Disorder Labs: No results found for: HGBA1C, MPG No results found for: PROLACTIN Lab Results  Component Value Date   CHOL 132 06/21/2014   HDL 51 06/21/2014   LDLCALC 61 06/21/2014     Current Medications: Current Outpatient Prescriptions  Medication Sig Dispense Refill  . amLODipine-benazepril (LOTREL) 5-20 MG capsule TAKE ONE CAPSULE BY MOUTH DAILY 90 capsule 0  . atorvastatin (LIPITOR) 20 MG tablet Take 1 tablet (20 mg total) by mouth daily. 90 tablet 3  . busPIRone (BUSPAR) 7.5 MG tablet Take 1 tablet (7.5 mg total) by mouth daily. 30 tablet 1  . clonazePAM (KLONOPIN) 0.5 MG tablet TAKE ONE TABLET BY MOUTH TWO TIMES A DAY AS NEEDED FOR  ANXIETY. DO NOT TAKE WITH TEMAZEPAM 60 tablet 0  . fluticasone (FLONASE) 50 MCG/ACT nasal spray Place into both nostrils daily.    . mirtazapine (REMERON) 15 MG tablet Take 0.5-1 tablets (7.5-15 mg total) by mouth at bedtime. 30 tablet 1  . raloxifene (EVISTA) 60 MG tablet Take 1 tablet (60 mg total) by mouth daily. 90 tablet 3  . temazepam (RESTORIL) 30 MG capsule TAKE ONE CAPSULE BY MOUTH AT BEDTIME AS NEEDED FOR SLEEP. DO NOT TAKE WITH CLONAZEPAM 30 capsule 0  . triamterene-hydrochlorothiazide (DYAZIDE) 37.5-25 MG capsule Take 1 each (1 capsule total) by mouth daily. 90 capsule 3  . venlafaxine XR (EFFEXOR-XR) 150 MG 24 hr capsule Take 1 capsule (150 mg total) by mouth daily with breakfast. 90 capsule 3   No current facility-administered medications for this visit.       Psychiatric Specialty Exam: Review of Systems  Cardiovascular: Negative for palpitations.  Skin: Negative for rash.  Psychiatric/Behavioral: The patient has insomnia.     Blood pressure 120/72, pulse 100, resp. rate 18, height 5\' 2"  (1.575 m), weight 200 lb (90.7 kg), SpO2 96 %.Body mass index is 36.58 kg/m.  General Appearance: Casual  Eye Contact:  Fair  Speech:  Normal Rate  Volume:  Normal  Mood:  Less anxious  Affect:  Fair and more reactive  Thought Process:  Goal Directed  Orientation:  Full (Time, Place, and Person)  Thought Content:  Rumination  Suicidal Thoughts:  No  Homicidal Thoughts:  No  Memory:  Immediate;   Fair Recent;   Fair  Judgement:  Fair  Insight:  Fair  Psychomotor Activity:  Decreased  Concentration:  Concentration: Fair and Attention Span: Fair  Recall:  [employer] of Knowledge:Fair  Language: Fair  Akathisia:  Negative  Handed:  Right  AIMS (if indicated):    Assets:  Desire for Improvement  ADL's:  Intact  Cognition: WNL  Sleep:  poor    Treatment Plan Summary: Medication management and Plan as follows  1. Anxiety with depression/ major depressive disorder mild: not  worse. Continue effexor and buspar.  2. Insomnia. Primary: improved. Continue working on sleep hygiene  Reviewed sleep hygiene again. Sleep study pending. Side effects reviewed FU 2 months  Merian Capron, MD 6/11/201811:22 AM

## 2016-08-03 ENCOUNTER — Other Ambulatory Visit: Payer: Self-pay | Admitting: Osteopathic Medicine

## 2016-08-03 DIAGNOSIS — F418 Other specified anxiety disorders: Secondary | ICD-10-CM

## 2016-08-10 ENCOUNTER — Encounter: Payer: Self-pay | Admitting: Osteopathic Medicine

## 2016-08-10 ENCOUNTER — Other Ambulatory Visit: Payer: Self-pay | Admitting: Osteopathic Medicine

## 2016-08-10 DIAGNOSIS — F5101 Primary insomnia: Secondary | ICD-10-CM

## 2016-08-10 DIAGNOSIS — F321 Major depressive disorder, single episode, moderate: Secondary | ICD-10-CM

## 2016-08-11 ENCOUNTER — Telehealth: Payer: Self-pay | Admitting: Osteopathic Medicine

## 2016-08-11 NOTE — Telephone Encounter (Signed)
To staff - do we know where this Rx might be and whether it's been faxed? See below Temazepam - I signed off on this yesterday    Our fax machines were not working yesterday due to phone lines being down near the office. I did sign the prescription and I will see if I can track it down and find out if it's been faxed today, we were still having some fax problems last I heard. If fax not working, we can leave the prescription up front for you to pick up. Someone should call you by end of day.  Apologies for our technical difficulties!   ===View-only below this line===   ----- Message -----    From: [name]    Sent: 08/10/2016  6:05 PM EDT      To: Emeterio Reeve, DO Subject: Non-Urgent Medical Question  I called in two prescriptions last night.  The mirtazaine was refilled and the pharmacy said they had received a message that a new prescription for temazepam waas being sent.  They never received if. Can you sort this out? Thank you. [name].

## 2016-08-11 NOTE — Telephone Encounter (Signed)
Called pt's pharmacy and spoke w/ben he stated that her medication is ready for p/u.[name]  Called pt and lvm informing her of this.[name]'

## 2016-08-18 ENCOUNTER — Other Ambulatory Visit: Payer: Self-pay | Admitting: Osteopathic Medicine

## 2016-08-18 DIAGNOSIS — M8580 Other specified disorders of bone density and structure, unspecified site: Secondary | ICD-10-CM

## 2016-08-21 DIAGNOSIS — G4733 Obstructive sleep apnea (adult) (pediatric): Secondary | ICD-10-CM | POA: Diagnosis not present

## 2016-08-25 ENCOUNTER — Telehealth: Payer: Self-pay | Admitting: Pulmonary Disease

## 2016-08-25 ENCOUNTER — Other Ambulatory Visit: Payer: Self-pay | Admitting: *Deleted

## 2016-08-25 DIAGNOSIS — G4733 Obstructive sleep apnea (adult) (pediatric): Secondary | ICD-10-CM | POA: Diagnosis not present

## 2016-08-25 DIAGNOSIS — G4700 Insomnia, unspecified: Secondary | ICD-10-CM

## 2016-08-25 NOTE — Telephone Encounter (Signed)
Per RA, HST showed on average 10 events per hour. Treatment options include weight loss alone or CPAP therapy or oral appliance. Will need office visit if she wants to discuss anything further.

## 2016-08-25 NOTE — Telephone Encounter (Signed)
Spoke with patient. She is aware of her results. She stated that she will work on her weight loss at this time.

## 2016-08-30 ENCOUNTER — Other Ambulatory Visit: Payer: Self-pay | Admitting: Osteopathic Medicine

## 2016-08-30 DIAGNOSIS — F418 Other specified anxiety disorders: Secondary | ICD-10-CM

## 2016-09-07 ENCOUNTER — Other Ambulatory Visit: Payer: Self-pay | Admitting: Osteopathic Medicine

## 2016-09-07 DIAGNOSIS — F321 Major depressive disorder, single episode, moderate: Secondary | ICD-10-CM

## 2016-09-07 DIAGNOSIS — F5101 Primary insomnia: Secondary | ICD-10-CM

## 2016-09-15 ENCOUNTER — Ambulatory Visit (HOSPITAL_COMMUNITY): Payer: Self-pay | Admitting: Psychiatry

## 2016-09-21 DIAGNOSIS — M25561 Pain in right knee: Secondary | ICD-10-CM | POA: Diagnosis not present

## 2016-09-27 ENCOUNTER — Other Ambulatory Visit: Payer: Self-pay | Admitting: Osteopathic Medicine

## 2016-09-27 DIAGNOSIS — I10 Essential (primary) hypertension: Secondary | ICD-10-CM

## 2016-09-28 ENCOUNTER — Other Ambulatory Visit: Payer: Self-pay | Admitting: Osteopathic Medicine

## 2016-09-28 DIAGNOSIS — F418 Other specified anxiety disorders: Secondary | ICD-10-CM

## 2016-10-04 ENCOUNTER — Other Ambulatory Visit: Payer: Self-pay | Admitting: Osteopathic Medicine

## 2016-10-04 DIAGNOSIS — F321 Major depressive disorder, single episode, moderate: Secondary | ICD-10-CM

## 2016-10-06 ENCOUNTER — Other Ambulatory Visit: Payer: Self-pay | Admitting: Osteopathic Medicine

## 2016-10-06 DIAGNOSIS — F321 Major depressive disorder, single episode, moderate: Secondary | ICD-10-CM

## 2016-10-11 ENCOUNTER — Other Ambulatory Visit (HOSPITAL_COMMUNITY): Payer: Self-pay | Admitting: Psychiatry

## 2016-10-22 ENCOUNTER — Encounter: Payer: Self-pay | Admitting: Osteopathic Medicine

## 2016-10-23 ENCOUNTER — Other Ambulatory Visit: Payer: Self-pay | Admitting: Osteopathic Medicine

## 2016-10-23 MED ORDER — ZOLPIDEM TARTRATE 10 MG PO TABS: 5.0000 mg | ORAL_TABLET | Freq: Every evening | ORAL | 3 refills | Status: DC | PRN

## 2016-10-23 NOTE — Progress Notes (Signed)
Faxing Ambien

## 2016-10-26 ENCOUNTER — Other Ambulatory Visit: Payer: Self-pay

## 2016-10-26 DIAGNOSIS — F418 Other specified anxiety disorders: Secondary | ICD-10-CM

## 2016-10-26 MED ORDER — CLONAZEPAM 0.5 MG PO TABS: ORAL_TABLET | ORAL | 0 refills | Status: DC

## 2016-10-26 NOTE — Telephone Encounter (Signed)
Patient request  Refill for Clonazepam 0.5 mg. #50 0 refills sent to [facility]. Rhonda Cunningham,CMA

## 2016-10-31 ENCOUNTER — Other Ambulatory Visit (HOSPITAL_COMMUNITY): Payer: Self-pay | Admitting: Psychiatry

## 2016-11-01 NOTE — Telephone Encounter (Signed)
Medication refill- received fax from [facility] requesting a refill for Buspar. Per Dr. De Nurse, refill request is denied. Pt will need an apt. Lvm for pt to contact office.

## 2016-11-02 ENCOUNTER — Other Ambulatory Visit: Payer: Self-pay | Admitting: Osteopathic Medicine

## 2016-11-02 DIAGNOSIS — F321 Major depressive disorder, single episode, moderate: Secondary | ICD-10-CM

## 2016-11-14 ENCOUNTER — Other Ambulatory Visit: Payer: Self-pay | Admitting: Osteopathic Medicine

## 2016-11-14 DIAGNOSIS — M8580 Other specified disorders of bone density and structure, unspecified site: Secondary | ICD-10-CM

## 2016-11-25 ENCOUNTER — Other Ambulatory Visit: Payer: Self-pay | Admitting: Osteopathic Medicine

## 2016-11-25 DIAGNOSIS — F418 Other specified anxiety disorders: Secondary | ICD-10-CM

## 2016-11-27 ENCOUNTER — Other Ambulatory Visit: Payer: Self-pay | Admitting: Osteopathic Medicine

## 2016-11-27 DIAGNOSIS — F418 Other specified anxiety disorders: Secondary | ICD-10-CM

## 2016-11-28 ENCOUNTER — Other Ambulatory Visit: Payer: Self-pay | Admitting: Osteopathic Medicine

## 2016-11-28 ENCOUNTER — Encounter: Payer: Self-pay | Admitting: Osteopathic Medicine

## 2016-11-28 ENCOUNTER — Other Ambulatory Visit (HOSPITAL_COMMUNITY): Payer: Self-pay | Admitting: Psychiatry

## 2016-11-28 DIAGNOSIS — F418 Other specified anxiety disorders: Secondary | ICD-10-CM

## 2016-12-01 ENCOUNTER — Other Ambulatory Visit: Payer: Self-pay | Admitting: Osteopathic Medicine

## 2016-12-01 DIAGNOSIS — F321 Major depressive disorder, single episode, moderate: Secondary | ICD-10-CM

## 2016-12-06 ENCOUNTER — Ambulatory Visit: Payer: Medicare Other | Admitting: Physician Assistant

## 2016-12-14 DIAGNOSIS — Z23 Encounter for immunization: Secondary | ICD-10-CM | POA: Diagnosis not present

## 2016-12-18 DIAGNOSIS — H04123 Dry eye syndrome of bilateral lacrimal glands: Secondary | ICD-10-CM | POA: Diagnosis not present

## 2016-12-18 DIAGNOSIS — H2512 Age-related nuclear cataract, left eye: Secondary | ICD-10-CM | POA: Diagnosis not present

## 2016-12-18 DIAGNOSIS — H5212 Myopia, left eye: Secondary | ICD-10-CM | POA: Diagnosis not present

## 2016-12-18 DIAGNOSIS — H5202 Hypermetropia, left eye: Secondary | ICD-10-CM | POA: Diagnosis not present

## 2016-12-23 ENCOUNTER — Other Ambulatory Visit: Payer: Self-pay | Admitting: Osteopathic Medicine

## 2016-12-23 DIAGNOSIS — I10 Essential (primary) hypertension: Secondary | ICD-10-CM

## 2016-12-25 ENCOUNTER — Other Ambulatory Visit: Payer: Self-pay | Admitting: Osteopathic Medicine

## 2016-12-25 DIAGNOSIS — I10 Essential (primary) hypertension: Secondary | ICD-10-CM

## 2016-12-26 ENCOUNTER — Other Ambulatory Visit: Payer: Self-pay | Admitting: Osteopathic Medicine

## 2016-12-26 DIAGNOSIS — F418 Other specified anxiety disorders: Secondary | ICD-10-CM

## 2016-12-28 ENCOUNTER — Other Ambulatory Visit: Payer: Self-pay | Admitting: Osteopathic Medicine

## 2016-12-28 ENCOUNTER — Ambulatory Visit (INDEPENDENT_AMBULATORY_CARE_PROVIDER_SITE_OTHER): Payer: Medicare Other | Admitting: Osteopathic Medicine

## 2016-12-28 ENCOUNTER — Encounter: Payer: Self-pay | Admitting: Osteopathic Medicine

## 2016-12-28 VITALS — BP 154/69 | HR 100 | Temp 98.6°F | Ht 62.0 in | Wt 203.0 lb

## 2016-12-28 DIAGNOSIS — J0100 Acute maxillary sinusitis, unspecified: Secondary | ICD-10-CM

## 2016-12-28 DIAGNOSIS — F418 Other specified anxiety disorders: Secondary | ICD-10-CM

## 2016-12-28 MED ORDER — IPRATROPIUM BROMIDE 0.06 % NA SOLN: 2.0000 | Freq: Four times a day (QID) | NASAL | 1 refills | Status: DC

## 2016-12-28 MED ORDER — CEPHALEXIN 500 MG PO CAPS: 500.0000 mg | ORAL_CAPSULE | Freq: Three times a day (TID) | ORAL | 0 refills | Status: DC

## 2016-12-28 NOTE — Progress Notes (Signed)
HPI: [name] is a 71 y.o. female who  has a past medical history of Anxiety, Depression, Headache, Hyperlipidemia, Hypertension, Osteopenia, and Primary localized osteoarthritis of right knee.  she presents to [facility] today, 12/28/16,  for chief complaint of:  Chief Complaint  Patient presents with  . Sinus Problem    Sinus congestion for about 2 weeks total. Initially cold type symptoms but sinus pressure and drainage seem to be persisting. No fever/chills. Over-the-counter medications have not helped.   Past medical, surgical, social and family history reviewed:  Patient Active Problem List   Diagnosis Date Noted  . Snoring 07/20/2016  . Obsessive-compulsive personality trait ([place]) 12/07/2015  . Morbid obesity ([place]) 09/19/2015  . Primary localized osteoarthritis of right knee   . Cardiac risk counseling 08/16/2015  . Essential hypertension 08/04/2015  . Hyperlipidemia 08/04/2015  . Osteopenia 08/04/2015  . Anxiety associated with depression 08/04/2015  . Right knee pain 06/14/2015  . Hyposomnia, insomnia or sleeplessness associated with anxiety 06/14/2015    Past Surgical History:  Procedure Laterality Date  . BREAST LUMPECTOMY Right 1997   Done in [place]  . CARPAL TUNNEL RELEASE Right 2015   Ortho in [place]  . LASIK    . MENISCUS REPAIR Right 2011   Orthopedic in [place]  . TOTAL KNEE ARTHROPLASTY Right 09/20/2015  . TOTAL KNEE ARTHROPLASTY Right 09/20/2015   Procedure: TOTAL KNEE ARTHROPLASTY;  Surgeon: Elsie Saas, MD;  Location: [facility];  Service: Orthopedics;  Laterality: Right;  . Trigger Thumb Right 2015   Done in [facility] at the same time as the carpal tunnel release  . TUBAL LIGATION  1980    Social History   Tobacco Use  . Smoking status: Never Smoker  . Smokeless tobacco: Never Used  Substance Use Topics  . Alcohol use: No    Comment: 1 glass a month    Family History  Problem Relation Age of Onset   . Heart failure Mother   . Heart attack Mother   . Alcohol abuse Maternal Aunt   . Heart attack Father   . Irregular heart beat Sister   . Hypertension Sister   . Heart disease Brother      Current medication list and allergy/intolerance information reviewed:    Current Outpatient Medications  Medication Sig Dispense Refill  . amLODipine-benazepril (LOTREL) 5-20 MG capsule TAKE ONE CAPSULE BY MOUTH DAILY 90 capsule 0  . atorvastatin (LIPITOR) 20 MG tablet Take 1 tablet (20 mg total) by mouth daily. 90 tablet 3  . busPIRone (BUSPAR) 7.5 MG tablet Take 1 tablet (7.5 mg total) by mouth daily. 30 tablet 1  . clonazePAM (KLONOPIN) 0.5 MG tablet TAKE ONE TABLET BY MOUTH TWICE A DAY AS NEEDED FOR ANXIETY *DO NOT TAKE WITH TEMAZEPAM* 60 tablet 0  . fluticasone (FLONASE) 50 MCG/ACT nasal spray Place into both nostrils daily.    . mirtazapine (REMERON) 15 MG tablet Take 0.5-1 tablets (7.5-15 mg total) by mouth at bedtime. Due for follow up visit 15 tablet 0  . raloxifene (EVISTA) 60 MG tablet TAKE ONE TABLET BY MOUTH DAILY 90 tablet 0  . temazepam (RESTORIL) 30 MG capsule TAKE ONE CAPSULE BY MOUTH AT BEDTIME AS NEEDED FOR SLEEP *DO NOT TAKE WITH AMBIEN* 30 capsule 0  . triamterene-hydrochlorothiazide (DYAZIDE) 37.5-25 MG capsule TAKE ONE CAPSULE BY MOUTH DAILY 83 capsule 2  . venlafaxine XR (EFFEXOR-XR) 150 MG 24 hr capsule Take 1 capsule (150 mg total) by mouth daily with breakfast.  90 capsule 3  . zolpidem (AMBIEN) 10 MG tablet Take 0.5-1 tablets (5-10 mg total) by mouth at bedtime as needed for sleep. Caution: do not take with opioid pain medications or other sedating substances 30 tablet 3   No current facility-administered medications for this visit.     Allergies  Allergen Reactions  . Penicillins Other (See Comments)    UNSPECIFIED   . Sulfa Antibiotics Other (See Comments)    UNSPECIFIED       Review of Systems:  Constitutional:  No  fever, +chills, +recent illness, No  unintentional weight changes. No significant fatigue.   HEENT: No  headache, no vision change, no hearing change, No sore throat, +sinus pressure  Cardiac: No  chest pain, No  pressure, No palpitations, No  Orthopnea  Respiratory:  No  shortness of breath. +mild Cough  Neurologic: No  weakness, No  dizziness  Exam:  BP (!) 154/69   Pulse 100   Temp 98.6 F (37 C) (Oral)   Ht 5\' 2"  (1.575 m)   Wt 203 lb (92.1 kg)   BMI 37.13 kg/m   Constitutional: VS see above. General Appearance: alert, well-developed, well-nourished, NAD  Eyes: Normal lids and conjunctive, non-icteric sclera  Ears, Nose, Mouth, Throat: MMM, Normal external inspection ears/nares/mouth/lips/gums.  Pharynx/tonsils no erythema, no exudate. Nasal mucosa normal.   Neck: No masses, trachea midline. No thyroid enlargement. No tenderness/mass appreciated. No lymphadenopathy  Respiratory: Normal respiratory effort. no wheeze, no rhonchi, no rales  Cardiovascular: S1/S2 normal, no murmur, no rub/gallop auscultated. RRR.   Musculoskeletal: Gait normal.   Neurological: Normal balance/coordination. No tremor.  Psychiatric: Normal judgment/insight. Normal mood and affect. Oriented x3.     ASSESSMENT/PLAN:   Acute maxillary sinusitis, recurrence not specified - Plan: ipratropium (ATROVENT) 0.06 % nasal spray, cephALEXin (KEFLEX) 500 MG capsule    Patient Instructions  Over-the-Counter Medications & Home Remedies for Upper Respiratory Illness  Note: the following list assumes no pregnancy, normal liver & kidney function and no other drug interactions. Dr. Sheppard Coil has highlighted medications which are safe for you to use, but these may not be appropriate for everyone. Always ask a pharmacist or qualified medical provider if you have any questions!   Aches/Pains, Fever, Headache Acetaminophen (Tylenol) 500 mg tablets - take max 2 tablets (1000 mg) every 6 hours (4 times per day)  Ibuprofen (Motrin) 200 mg tablets  - take max 4 tablets (800 mg) every 6 hours*  Sinus Congestion Prescription Atrovent as directed Nasal Saline if desired Oxymetolazone (Afrin, others) sparing use due to rebound congestion, NEVER use in kids Phenylephrine (Sudafed) 10 mg tablets every 4 hours (or the 12-hour formulation)* Diphenhydramine (Benadryl) 25 mg tablets - take max 2 tablets every 4 hours  Cough & Sore Throat Prescription cough pills or syrups as directed Dextromethorphan (Robitussin, others) - cough suppressant Guaifenesin (Robitussin, Mucinex, others) - expectorant (helps cough up mucus) (Dextromethorphan and Guaifenesin also come in a combination tablet) Lozenges w/ Benzocaine + Menthol (Cepacol) Honey - as much as you want! Teas which "coat the throat" - look for ingredients Elm Bark, Licorice Root, Marshmallow Root  Other Antibiotics if these are prescribed - take ALL, even if you're feeling better  Zinc Lozenges within 24 hours of symptoms onset - mixed evidence this shortens the duration of the common cold Don't waste your money on Vitamin C or Echinacea  *Caution in patients with high blood pressure       Visit summary with medication list and pertinent instructions  was printed for patient to review. All questions at time of visit were answered - patient instructed to contact office with any additional concerns. ER/RTC precautions were reviewed with the patient. Follow-up plan: Return if symptoms worsen or fail to improve.    Please note: voice recognition software was used to produce this document, and typos may escape review. Please contact Dr. Sheppard Coil for any needed clarifications.

## 2016-12-28 NOTE — Patient Instructions (Signed)
Over-the-Counter Medications & Home Remedies for Upper Respiratory Illness  Note: the following list assumes no pregnancy, normal liver & kidney function and no other drug interactions. Dr. Sharea Guinther has highlighted medications which are safe for you to use, but these may not be appropriate for everyone. Always ask a pharmacist or qualified medical provider if you have any questions!   Aches/Pains, Fever, Headache Acetaminophen (Tylenol) 500 mg tablets - take max 2 tablets (1000 mg) every 6 hours (4 times per day)  Ibuprofen (Motrin) 200 mg tablets - take max 4 tablets (800 mg) every 6 hours*  Sinus Congestion Prescription Atrovent as directed Nasal Saline if desired Oxymetolazone (Afrin, others) sparing use due to rebound congestion, NEVER use in kids Phenylephrine (Sudafed) 10 mg tablets every 4 hours (or the 12-hour formulation)* Diphenhydramine (Benadryl) 25 mg tablets - take max 2 tablets every 4 hours  Cough & Sore Throat Prescription cough pills or syrups as directed Dextromethorphan (Robitussin, others) - cough suppressant Guaifenesin (Robitussin, Mucinex, others) - expectorant (helps cough up mucus) (Dextromethorphan and Guaifenesin also come in a combination tablet) Lozenges w/ Benzocaine + Menthol (Cepacol) Honey - as much as you want! Teas which "coat the throat" - look for ingredients Elm Bark, Licorice Root, Marshmallow Root  Other Antibiotics if these are prescribed - take ALL, even if you're feeling better  Zinc Lozenges within 24 hours of symptoms onset - mixed evidence this shortens the duration of the common cold Don't waste your money on Vitamin C or Echinacea  *Caution in patients with high blood pressure   

## 2016-12-29 ENCOUNTER — Other Ambulatory Visit: Payer: Self-pay

## 2016-12-29 MED ORDER — BUSPIRONE HCL 7.5 MG PO TABS: 7.5000 mg | ORAL_TABLET | Freq: Every day | ORAL | 1 refills | Status: DC

## 2016-12-29 NOTE — Telephone Encounter (Signed)
Patient requested refill for Buspar 7.5 mg. #30 1 refill sent to [facility]. Nimco Bivens,CMA

## 2017-01-01 ENCOUNTER — Ambulatory Visit (INDEPENDENT_AMBULATORY_CARE_PROVIDER_SITE_OTHER): Payer: Medicare Other | Admitting: Osteopathic Medicine

## 2017-01-01 ENCOUNTER — Encounter: Payer: Self-pay | Admitting: Osteopathic Medicine

## 2017-01-01 VITALS — BP 124/71 | HR 92 | Temp 98.7°F | Resp 18 | Wt 205.9 lb

## 2017-01-01 DIAGNOSIS — Z0000 Encounter for general adult medical examination without abnormal findings: Secondary | ICD-10-CM | POA: Diagnosis not present

## 2017-01-01 DIAGNOSIS — L821 Other seborrheic keratosis: Secondary | ICD-10-CM

## 2017-01-01 DIAGNOSIS — M8588 Other specified disorders of bone density and structure, other site: Secondary | ICD-10-CM

## 2017-01-01 DIAGNOSIS — R0609 Other forms of dyspnea: Secondary | ICD-10-CM

## 2017-01-01 DIAGNOSIS — M1991 Primary osteoarthritis, unspecified site: Secondary | ICD-10-CM | POA: Diagnosis not present

## 2017-01-01 MED ORDER — DICLOFENAC SODIUM 1 % TD GEL: 2.0000 g | Freq: Four times a day (QID) | TRANSDERMAL | 11 refills | Status: DC

## 2017-01-01 MED ORDER — ZOSTER VAC RECOMB ADJUVANTED 50 MCG/0.5ML IM SUSR: 0.5000 mL | Freq: Once | INTRAMUSCULAR | 1 refills | Status: AC

## 2017-01-01 NOTE — Progress Notes (Signed)
HPI: [name] is a 71 y.o. female who  has a past medical history of Anxiety, Depression, Headache, Hyperlipidemia, Hypertension, Osteopenia, and Primary localized osteoarthritis of right knee.  she presents to [facility] today, 01/01/17,  for chief complaint of:  Chief Complaint  Patient presents with  . Annual Exam    No Medicare Wellness      Patient here for annual physical / wellness exam.  See preventive care reviewed as below.  Recent labs reviewed in detail with the patient.    Additional concerns today include:   Requests Stress test - new complaint. concerned since she is starting an exercise regimen and feeling a lot of shortness of breath when she is exercising, no shortness of breath with minimal activity/daily activities such as climbing a flight of stairs, walking across a parking lot. She has been going to the [facility]. No chest pain on exertion. No lightheadedness or falling/LOC. Her dad had a lot of heart problems so she is concerned.  Arthritis: Getting a bit worse especially in the hands/fingers, wants know if there is anything else that she can take for this.  Skin concern: Rough patch of skin on the left side of the face, new complaint. Not irritated/itchy or otherwise bothersome. No discoloration. She states at some point it was frozen off but seems to have recurred. She is nervous to get a wider biopsy, would like to avoid severe scarring.     Past medical, surgical, social and family history reviewed:  Patient Active Problem List   Diagnosis Date Noted  . Snoring 07/20/2016  . Obsessive-compulsive personality trait ([place]) 12/07/2015  . Morbid obesity ([place]) 09/19/2015  . Primary localized osteoarthritis of right knee   . Cardiac risk counseling 08/16/2015  . Essential hypertension 08/04/2015  . Hyperlipidemia 08/04/2015  . Osteopenia 08/04/2015  . Anxiety associated with depression 08/04/2015  . Right knee pain  06/14/2015  . Hyposomnia, insomnia or sleeplessness associated with anxiety 06/14/2015    Past Surgical History:  Procedure Laterality Date  . BREAST LUMPECTOMY Right 1997   Done in [place]  . CARPAL TUNNEL RELEASE Right 2015   Ortho in [place]  . LASIK    . MENISCUS REPAIR Right 2011   Orthopedic in [place]  . TOTAL KNEE ARTHROPLASTY Right 09/20/2015  . TOTAL KNEE ARTHROPLASTY Right 09/20/2015   Performed by Elsie Saas, MD at [place]  . Trigger Thumb Right 2015   Done in [facility] at the same time as the carpal tunnel release  . TUBAL LIGATION  1980    Social History   Tobacco Use  . Smoking status: Never Smoker  . Smokeless tobacco: Never Used  Substance Use Topics  . Alcohol use: No    Comment: 1 glass a month    Family History  Problem Relation Age of Onset  . Heart failure Mother   . Heart attack Mother   . Alcohol abuse Maternal Aunt   . Heart attack Father   . Irregular heart beat Sister   . Hypertension Sister   . Heart disease Brother      Current medication list and allergy/intolerance information reviewed:    Current Outpatient Medications  Medication Sig Dispense Refill  . amLODipine-benazepril (LOTREL) 5-20 MG capsule TAKE ONE CAPSULE BY MOUTH DAILY 90 capsule 0  . atorvastatin (LIPITOR) 20 MG tablet Take 1 tablet (20 mg total) by mouth daily. 90 tablet 3  . cephALEXin (KEFLEX) 500 MG capsule Take 1 capsule (500 mg  total) 3 (three) times daily by mouth. 21 capsule 0  . clonazePAM (KLONOPIN) 0.5 MG tablet TAKE ONE TABLET BY MOUTH TWICE A DAY AS NEEDED FOR ANXIETY *DO NOT TAKE WITH TEMAZEPAM* 60 tablet 0  . fluticasone (FLONASE) 50 MCG/ACT nasal spray Place into both nostrils daily.    Marland Kitchen FLUZONE HIGH-DOSE 0.5 ML injection Inject 0.5 mLs as directed into the muscle.  0  . ipratropium (ATROVENT) 0.06 % nasal spray Place 2 sprays 4 (four) times daily into both nostrils. As needed for rhinitis 15 mL 1  . mirtazapine (REMERON) 15 MG tablet Take 0.5-1  tablets (7.5-15 mg total) by mouth at bedtime. Due for follow up visit 15 tablet 0  . raloxifene (EVISTA) 60 MG tablet TAKE ONE TABLET BY MOUTH DAILY 90 tablet 0  . temazepam (RESTORIL) 30 MG capsule TAKE ONE CAPSULE BY MOUTH AT BEDTIME AS NEEDED FOR SLEEP *DO NOT TAKE WITH AMBIEN* 30 capsule 0  . triamterene-hydrochlorothiazide (DYAZIDE) 37.5-25 MG capsule TAKE ONE CAPSULE BY MOUTH DAILY 83 capsule 2  . venlafaxine XR (EFFEXOR-XR) 150 MG 24 hr capsule Take 1 capsule (150 mg total) by mouth daily with breakfast. 90 capsule 3  . zolpidem (AMBIEN) 10 MG tablet Take 0.5-1 tablets (5-10 mg total) by mouth at bedtime as needed for sleep. Caution: do not take with opioid pain medications or other sedating substances 30 tablet 3   No current facility-administered medications for this visit.     Allergies  Allergen Reactions  . Penicillins Other (See Comments)    UNSPECIFIED   . Sulfa Antibiotics Other (See Comments)    UNSPECIFIED       Review of Systems:  Constitutional:  No  fever, no chills, No recent illness, No unintentional weight changes. No significant fatigue.   HEENT: No  headache, no vision change, no hearing change, No sore throat, No  sinus pressure  Cardiac: No  chest pain, No  pressure, No palpitations, No  Orthopnea  Respiratory:  +shortness of breath. No  Cough  Gastrointestinal: No  abdominal pain, No  nausea  Musculoskeletal: No new myalgia/arthralgia  Skin: +Rash, No other wounds/concerning lesions  Hem/Onc: No  easy bruising/bleeding  Endocrine: No cold intolerance,  No heat intolerance  Neurologic: No  weakness, No  dizziness, No  slurred speech/focal weakness/facial droop  Psychiatric: No  concerns with depression, No  concerns with anxiety, No sleep problems, No mood problems  Exam:  BP 124/71 (BP Location: Left Arm, Patient Position: Sitting, Cuff Size: Large)   Pulse 92   Temp 98.7 F (37.1 C) (Oral)   Resp 18   Wt 205 lb 14.4 oz (93.4 kg)   SpO2  99%   BMI 37.66 kg/m   Constitutional: VS see above. General Appearance: alert, well-developed, well-nourished, NAD  Eyes: Normal lids and conjunctive, non-icteric sclera  Ears, Nose, Mouth, Throat: MMM, Normal external inspection ears/nares/mouth/lips/gums.  Neck: No masses, trachea midline. No thyroid enlargement. No tenderness/mass appreciated. No lymphadenopathy  Respiratory: Normal respiratory effort. no wheeze, no rhonchi, no rales  Cardiovascular: S1/S2 normal, no murmur, no rub/gallop auscultated. RRR. No lower extremity edema. Pedal pulse II/IV bilaterally DP and PT.   Gastrointestinal: Nontender, no masses. No hepatomegaly, no splenomegaly.   Musculoskeletal: Gait normal.  Neurological: Normal balance/coordination. No tremor  Skin: warm, dry, intact. Left cheek small rough macule bit more consistent with seborrheic keratosis then anything concerning for malignancy or other neoplasm  Psychiatric: Normal judgment/insight. Normal mood and affect. Oriented x3.    ASSESSMENT/PLAN:  Physical exam - Preventive care reviewed as below. Patient got flu shot today, will think about updating shingles vaccine. Not yet due for mammogram, bone density - Plan: FLUZONE HIGH-DOSE 0.5 ML injection, CBC, COMPLETE METABOLIC PANEL WITH GFR, Lipid panel, TSH, VITAMIN D 25 Hydroxy (Vit-D Deficiency, Fractures), Zoster Vaccine Adjuvanted [facility]) injection  Dyspnea on exertion - No angina, claudication or other concerning symptoms. We'll go ahead and get exercise stress test and refer to cardiology if any abnormality - Plan: Exercise Tolerance Test  Osteopenia of lumbar spine - Advise calcium/vitamin D, not yet due for repeat bone density test - Plan: VITAMIN D 25 Hydroxy (Vit-D Deficiency, Fractures)  Seborrheic keratoses - Offered referral to dermatology for second opinion/biopsy if needed, patient will think about this  Primary osteoarthritis, unspecified site - Plan: diclofenac sodium  (VOLTAREN) 1 % GEL     FEMALE PREVENTIVE CARE Updated 01/01/17   ANNUAL SCREENING/COUNSELING  Diet/Exercise - HEALTHY HABITS DISCUSSED TO DECREASE CV RISK Social History   Tobacco Use  Smoking Status Never Smoker  Smokeless Tobacco Never Used   Social History   Substance and Sexual Activity  Alcohol Use No   Comment: 1 glass a month   Depression screen PHQ 2/9 12/28/2016  Decreased Interest 1  Down, Depressed, Hopeless 1  PHQ - 2 Score 2  Altered sleeping 1  Tired, decreased energy 1  Change in appetite 0  Feeling bad or failure about yourself  1  Trouble concentrating 0  Moving slowly or fidgety/restless 0  Suicidal thoughts 0  PHQ-9 Score 5  Difficult doing work/chores -  Some encounter information is confidential and restricted. Go to Review Flowsheets activity to see all data.    Domestic violence concerns - no  HTN SCREENING - SEE VITALS  SEXUAL HEALTH  Need/want STI testing today? - no  Concerns about libido or pain with sex? - no  INFECTIOUS DISEASE SCREENING  HIV - does not need  GC/CT - does not need  HepC - DOB [birth year] - needs - declined  TB - does not need  DISEASE SCREENING  Lipid - needs  DM2 - needs  Osteoporosis - women age 80+ - does not need  CANCER SCREENING  Cervical - does not need  Breast - does not need  Lung - does not need  Colon - does not need  ADULT VACCINATION  Influenza - annual vaccine recommended  Td - booster every 10 years   Zoster - Shingrix recommended 50+  PCV13 - already has  PPSV23 - already has Immunization History  Administered Date(s) Administered  . Influenza,inj,Quad PF,6+ Mos 12/07/2015  . Pneumococcal Conjugate-13 07/01/2014  . Pneumococcal Polysaccharide-23 07/07/2010  . Tdap 08/18/2008, 12/05/2011  . Zoster 08/22/2010    OTHER  Fall - exercise and Vit D age 71+ - does not need     Visit summary with medication list and pertinent instructions was printed for patient to  review. All questions at time of visit were answered - patient instructed to contact office with any additional concerns. ER/RTC precautions were reviewed with the patient. Follow-up plan: Return in about 6 months (around 07/01/2017) for recheck chronic medical conditions, BP, insomnia.  Note: Total time spent on problem base portion of the encounter was 25 minutes, greater than 50% of the visit was spent face-to-face counseling and coordinating care for the following: The primary encounter diagnosis was Dyspnea on exertion. Diagnoses of , Osteopenia of lumbar spine, Seborrheic keratoses, and Primary osteoarthritis, unspecified site were also pertinent to  this visit.Marland Kitchen  Please note: voice recognition software was used to produce this document, and typos may escape review. Please contact Dr. Sheppard Coil for any needed clarifications.

## 2017-01-08 DIAGNOSIS — D649 Anemia, unspecified: Secondary | ICD-10-CM | POA: Diagnosis not present

## 2017-01-08 DIAGNOSIS — Z0000 Encounter for general adult medical examination without abnormal findings: Secondary | ICD-10-CM | POA: Diagnosis not present

## 2017-01-08 DIAGNOSIS — M8588 Other specified disorders of bone density and structure, other site: Secondary | ICD-10-CM | POA: Diagnosis not present

## 2017-01-08 DIAGNOSIS — R739 Hyperglycemia, unspecified: Secondary | ICD-10-CM | POA: Diagnosis not present

## 2017-01-10 ENCOUNTER — Other Ambulatory Visit: Payer: Self-pay | Admitting: Osteopathic Medicine

## 2017-01-10 DIAGNOSIS — D509 Iron deficiency anemia, unspecified: Secondary | ICD-10-CM

## 2017-01-10 DIAGNOSIS — R7301 Impaired fasting glucose: Secondary | ICD-10-CM

## 2017-01-10 NOTE — Progress Notes (Signed)
Added labs

## 2017-01-11 ENCOUNTER — Telehealth (HOSPITAL_COMMUNITY): Payer: Self-pay

## 2017-01-11 NOTE — Telephone Encounter (Signed)
Encounter complete. 

## 2017-01-12 ENCOUNTER — Encounter: Payer: Self-pay | Admitting: Osteopathic Medicine

## 2017-01-12 DIAGNOSIS — R7303 Prediabetes: Secondary | ICD-10-CM | POA: Insufficient documentation

## 2017-01-12 LAB — IRON, TOTAL/TOTAL IRON BINDING CAP
%SAT: 14 % (ref 11–50)
Iron: 58 ug/dL (ref 45–160)
TIBC: 418 mcg/dL (calc) (ref 250–450)

## 2017-01-12 LAB — COMPLETE METABOLIC PANEL WITH GFR
AG Ratio: 1.5 (calc) (ref 1.0–2.5)
ALBUMIN MSPROF: 4.2 g/dL (ref 3.6–5.1)
ALKALINE PHOSPHATASE (APISO): 94 U/L (ref 33–130)
ALT: 17 U/L (ref 6–29)
AST: 18 U/L (ref 10–35)
BUN / CREAT RATIO: 23 (calc) — AB (ref 6–22)
BUN: 24 mg/dL (ref 7–25)
CO2: 24 mmol/L (ref 20–32)
CREATININE: 1.03 mg/dL — AB (ref 0.60–0.93)
Calcium: 9.2 mg/dL (ref 8.6–10.4)
Chloride: 104 mmol/L (ref 98–110)
GFR, EST AFRICAN AMERICAN: 63 mL/min/{1.73_m2} (ref >=60)
GFR, Est Non African American: 54 mL/min/{1.73_m2} — ABNORMAL LOW (ref >=60)
GLUCOSE: 105 mg/dL — AB (ref 65–99)
Globulin: 2.8 g/dL (calc) (ref 1.9–3.7)
Potassium: 4.3 mmol/L (ref 3.5–5.3)
Sodium: 141 mmol/L (ref 135–146)
TOTAL PROTEIN: 7 g/dL (ref 6.1–8.1)
Total Bilirubin: 0.3 mg/dL (ref 0.2–1.2)

## 2017-01-12 LAB — TEST AUTHORIZATION

## 2017-01-12 LAB — CBC
HEMATOCRIT: 34.1 % — AB (ref 35.0–45.0)
HEMOGLOBIN: 11.3 g/dL — AB (ref 11.7–15.5)
MCH: 26.3 pg — ABNORMAL LOW (ref 27.0–33.0)
MCHC: 33.1 g/dL (ref 32.0–36.0)
MCV: 79.5 fL — AB (ref 80.0–100.0)
MPV: 10.2 fL (ref 7.5–12.5)
Platelets: 440 10*3/uL — ABNORMAL HIGH (ref 140–400)
RBC: 4.29 10*6/uL (ref 3.80–5.10)
RDW: 15.6 % — AB (ref 11.0–15.0)
WBC: 7.5 10*3/uL (ref 3.8–10.8)

## 2017-01-12 LAB — LIPID PANEL
CHOL/HDL RATIO: 3.2 (calc) (ref <5.0)
CHOLESTEROL: 180 mg/dL (ref <200)
HDL: 56 mg/dL (ref >50)
LDL CHOLESTEROL (CALC): 97 mg/dL
Non-HDL Cholesterol (Calc): 124 mg/dL (calc) (ref <130)
TRIGLYCERIDES: 168 mg/dL — AB (ref <150)

## 2017-01-12 LAB — TSH: TSH: 4.01 m[IU]/L (ref 0.40–4.50)

## 2017-01-12 LAB — FERRITIN: FERRITIN: 32 ng/mL (ref 20–288)

## 2017-01-12 LAB — HEMOGLOBIN A1C W/OUT EAG: HEMOGLOBIN A1C: 6.1 %{Hb} — AB (ref <5.7)

## 2017-01-12 LAB — VITAMIN D 25 HYDROXY (VIT D DEFICIENCY, FRACTURES): Vit D, 25-Hydroxy: 35 ng/mL (ref 30–100)

## 2017-01-16 ENCOUNTER — Ambulatory Visit (HOSPITAL_COMMUNITY)
Admission: RE | Admit: 2017-01-16 | Discharge: 2017-01-16 | Disposition: A | Discharge status: Home / Self Care | Payer: Medicare Other | Source: Ambulatory Visit | Attending: Cardiology | Admitting: Cardiology

## 2017-01-16 DIAGNOSIS — R0609 Other forms of dyspnea: Secondary | ICD-10-CM

## 2017-01-16 DIAGNOSIS — R0600 Dyspnea, unspecified: Secondary | ICD-10-CM | POA: Insufficient documentation

## 2017-01-16 LAB — EXERCISE TOLERANCE TEST
CHL CUP RESTING HR STRESS: 107 {beats}/min
CHL RATE OF PERCEIVED EXERTION: 18
CSEPEW: 7 METS
Exercise duration (min): 5 min
Exercise duration (sec): 26 s
MPHR: 148 {beats}/min
Peak HR: 153 {beats}/min
Percent HR: 103 %

## 2017-01-18 ENCOUNTER — Other Ambulatory Visit: Payer: Self-pay

## 2017-01-24 ENCOUNTER — Other Ambulatory Visit: Payer: Self-pay | Admitting: Osteopathic Medicine

## 2017-01-24 DIAGNOSIS — F418 Other specified anxiety disorders: Secondary | ICD-10-CM

## 2017-01-24 DIAGNOSIS — F321 Major depressive disorder, single episode, moderate: Secondary | ICD-10-CM

## 2017-02-07 ENCOUNTER — Encounter: Payer: Self-pay | Admitting: Osteopathic Medicine

## 2017-02-07 MED ORDER — ZOLPIDEM TARTRATE 10 MG PO TABS: 5.0000 mg | ORAL_TABLET | Freq: Every evening | ORAL | 3 refills | Status: DC | PRN

## 2017-02-21 ENCOUNTER — Other Ambulatory Visit: Payer: Self-pay | Admitting: Osteopathic Medicine

## 2017-02-21 DIAGNOSIS — M8580 Other specified disorders of bone density and structure, unspecified site: Secondary | ICD-10-CM

## 2017-03-06 ENCOUNTER — Encounter: Payer: Self-pay | Admitting: Osteopathic Medicine

## 2017-03-07 ENCOUNTER — Encounter: Payer: Self-pay | Admitting: Osteopathic Medicine

## 2017-03-07 MED ORDER — VENLAFAXINE HCL ER 150 MG PO CP24: 150.0000 mg | ORAL_CAPSULE | Freq: Every day | ORAL | 3 refills | Status: DC

## 2017-03-15 ENCOUNTER — Encounter: Payer: Self-pay | Admitting: Osteopathic Medicine

## 2017-03-16 ENCOUNTER — Encounter: Payer: Self-pay | Admitting: Osteopathic Medicine

## 2017-03-16 DIAGNOSIS — F5101 Primary insomnia: Secondary | ICD-10-CM

## 2017-03-16 MED ORDER — TEMAZEPAM 30 MG PO CAPS: ORAL_CAPSULE | ORAL | 2 refills | Status: DC

## 2017-03-20 ENCOUNTER — Encounter: Payer: Self-pay | Admitting: Physician Assistant

## 2017-03-20 ENCOUNTER — Ambulatory Visit (INDEPENDENT_AMBULATORY_CARE_PROVIDER_SITE_OTHER): Payer: Medicare Other | Admitting: Physician Assistant

## 2017-03-20 VITALS — BP 137/78 | HR 89 | Temp 98.1°F | Wt 209.0 lb

## 2017-03-20 DIAGNOSIS — J019 Acute sinusitis, unspecified: Secondary | ICD-10-CM

## 2017-03-20 MED ORDER — CEPHALEXIN 500 MG PO CAPS: 500.0000 mg | ORAL_CAPSULE | Freq: Two times a day (BID) | ORAL | 0 refills | Status: AC

## 2017-03-20 NOTE — Patient Instructions (Signed)

## 2017-03-20 NOTE — Progress Notes (Signed)
HPI:                                                                [name] is a 72 y.o. female who presents to [place]: [place] today for sinus problem  Sinus Problem  This is a recurrent problem. The current episode started 1 to 4 weeks ago. The problem is unchanged. There has been no fever. Associated symptoms include chills, congestion and sinus pressure. Pertinent negatives include no coughing or shortness of breath. (+ PND) Past treatments include oral decongestants. The treatment provided no relief.      Depression screen [facility] 2/9 12/28/2016 02/15/2016 01/18/2016 12/07/2015  Decreased Interest 1 1 1  0  Down, Depressed, Hopeless 1 2 2  0  PHQ - 2 Score 2 3 3  0  Altered sleeping 1 3 3  -  Tired, decreased energy 1 2 3  -  Change in appetite 0 1 2 -  Feeling bad or failure about yourself  1 1 3  -  Trouble concentrating 0 1 1 -  Moving slowly or fidgety/restless 0 0 0 -  Suicidal thoughts 0 1 1 -  PHQ-9 Score 5 12 16  -  Difficult doing work/chores - Very difficult - -  Some encounter information is confidential and restricted. Go to Review Flowsheets activity to see all data.    GAD 7 : Generalized Anxiety Score 02/15/2016 01/18/2016  Nervous, Anxious, on Edge 2 3  Control/stop worrying 2 3  Worry too much - different things 2 3  Trouble relaxing 2 3  Restless 1 2  Easily annoyed or irritable 1 2  Afraid - awful might happen 1 3  Total GAD 7 Score 11 19  Anxiety Difficulty Somewhat difficult Very difficult  Some encounter information is confidential and restricted. Go to Review Flowsheets activity to see all data.      Past Medical History:  Diagnosis Date  . Anxiety   . Depression   . Headache    SINUS  . Hyperlipidemia   . Hypertension   . Osteopenia   . Primary localized osteoarthritis of right knee    Past Surgical History:  Procedure Laterality Date  . BREAST LUMPECTOMY Right 1997   Done in [place]  .  CARPAL TUNNEL RELEASE Right 2015   Ortho in [place]  . LASIK    . MENISCUS REPAIR Right 2011   Orthopedic in [place]  . TOTAL KNEE ARTHROPLASTY Right 09/20/2015  . TOTAL KNEE ARTHROPLASTY Right 09/20/2015   Procedure: TOTAL KNEE ARTHROPLASTY;  Surgeon: Elsie Saas, MD;  Location: [place];  Service: Orthopedics;  Laterality: Right;  . Trigger Thumb Right 2015   Done in [facility] at the same time as the carpal tunnel release  . TUBAL LIGATION  1980   Social History   Tobacco Use  . Smoking status: Never Smoker  . Smokeless tobacco: Never Used  Substance Use Topics  . Alcohol use: No    Comment: 1 glass a month   family history includes Alcohol abuse in her maternal aunt; Heart attack in her father and mother; Heart disease in her brother; Heart failure in her mother; Hypertension in her sister; Irregular heart beat in her sister.    ROS: negative except as noted in the HPI  Medications: Current Outpatient Medications  Medication Sig Dispense Refill  . amLODipine-benazepril (LOTREL) 5-20 MG capsule TAKE ONE CAPSULE BY MOUTH DAILY 90 capsule 0  . atorvastatin (LIPITOR) 20 MG tablet Take 1 tablet (20 mg total) by mouth daily. 90 tablet 3  . clonazePAM (KLONOPIN) 0.5 MG tablet TAKE ONE TABLET BY MOUTH TWICE A DAY AS NEEDED FOR ANXIETY *DO NOT TAKE WITH TEMAZEPAM* 60 tablet 3  . diclofenac sodium (VOLTAREN) 1 % GEL Apply 2-4 g 4 (four) times daily topically. 100 g 11  . fluticasone (FLONASE) 50 MCG/ACT nasal spray Place into both nostrils daily.    Marland Kitchen FLUZONE HIGH-DOSE 0.5 ML injection Inject 0.5 mLs as directed into the muscle.  0  . ipratropium (ATROVENT) 0.06 % nasal spray Place 2 sprays 4 (four) times daily into both nostrils. As needed for rhinitis 15 mL 1  . mirtazapine (REMERON) 15 MG tablet TAKE ONE-HALF TO ONE TABLET BY MOUTH AT BEDTIME 90 tablet 3  . raloxifene (EVISTA) 60 MG tablet TAKE ONE TABLET BY MOUTH DAILY 90 tablet 1  . temazepam (RESTORIL) 30 MG capsule TAKE ONE CAPSULE  BY MOUTH AT BEDTIME AS NEEDED FOR SLEEP *DO NOT TAKE WITH AMBIEN* 30 capsule 2  . triamterene-hydrochlorothiazide (DYAZIDE) 37.5-25 MG capsule TAKE ONE CAPSULE BY MOUTH DAILY 83 capsule 2  . venlafaxine XR (EFFEXOR-XR) 150 MG 24 hr capsule Take 1 capsule (150 mg total) by mouth daily with breakfast. 90 capsule 3  . zolpidem (AMBIEN) 10 MG tablet Take 0.5-1 tablets (5-10 mg total) by mouth at bedtime as needed for sleep. Caution: do not take with other sedating substances 30 tablet 3   No current facility-administered medications for this visit.    Allergies  Allergen Reactions  . Penicillins Other (See Comments)    UNSPECIFIED   . Sulfa Antibiotics Other (See Comments)    UNSPECIFIED        Objective:  BP (!) 145/77   Pulse 89   Temp 98.1 F (36.7 C) (Oral)   Wt 209 lb (94.8 kg)   SpO2 98%   BMI 38.23 kg/m  Gen:  alert, not ill-appearing, no distress, appropriate for age 5: head normocephalic without obvious abnormality, conjunctiva and cornea clear, nasal mucosa edematous, scant amount of dried blood in the right nasal septum, there is b/l frontal sinus tenderness, oropharynx clear, moist mucous membranes, neck supple, no adenopathy, trachea midline Pulm: Normal work of breathing, normal phonation, clear to auscultation bilaterally, no wheezes, rales or rhonchi CV: Normal rate, regular rhythm, s1 and s2 distinct, no murmurs, clicks or rubs  Neuro: alert and oriented x 3, no tremor MSK: extremities atraumatic, normal gait and station Skin: intact, no rashes on exposed skin, no jaundice, no cyanosis   No results found for this or any previous visit (from the past 72 hour(s)). No results found.    Assessment and Plan: 72 y.o. female with   1. Acute non-recurrent sinusitis, unspecified location - continue Flonase - cephALEXin (KEFLEX) 500 MG capsule; Take 1 capsule (500 mg total) by mouth 2 (two) times daily for 10 days.  Dispense: 20 capsule; Refill: 0   Patient  education and anticipatory guidance given Patient agrees with treatment plan Follow-up as needed if symptoms worsen or fail to improve  Darlyne Russian PA-C

## 2017-04-08 ENCOUNTER — Other Ambulatory Visit: Payer: Self-pay | Admitting: Osteopathic Medicine

## 2017-04-08 DIAGNOSIS — I10 Essential (primary) hypertension: Secondary | ICD-10-CM

## 2017-04-20 ENCOUNTER — Ambulatory Visit (INDEPENDENT_AMBULATORY_CARE_PROVIDER_SITE_OTHER): Payer: Medicare Other | Admitting: Osteopathic Medicine

## 2017-04-20 ENCOUNTER — Encounter: Payer: Self-pay | Admitting: Osteopathic Medicine

## 2017-04-20 VITALS — BP 154/60 | HR 90 | Temp 98.2°F | Wt 206.0 lb

## 2017-04-20 DIAGNOSIS — J0100 Acute maxillary sinusitis, unspecified: Secondary | ICD-10-CM

## 2017-04-20 MED ORDER — BENZONATATE 200 MG PO CAPS: 200.0000 mg | ORAL_CAPSULE | Freq: Three times a day (TID) | ORAL | 0 refills | Status: DC | PRN

## 2017-04-20 MED ORDER — DOXYCYCLINE HYCLATE 100 MG PO TABS: 100.0000 mg | ORAL_TABLET | Freq: Two times a day (BID) | ORAL | 0 refills | Status: DC

## 2017-04-20 MED ORDER — IPRATROPIUM BROMIDE 0.06 % NA SOLN: 2.0000 | Freq: Four times a day (QID) | NASAL | 1 refills | Status: DC

## 2017-04-20 NOTE — Progress Notes (Signed)
HPI: [name] is a 72 y.o. female who  has a past medical history of Anxiety, Depression, Headache, Hyperlipidemia, Hypertension, Osteopenia, and Primary localized osteoarthritis of right knee.  she presents to [facility] today, 04/20/17,  for chief complaint of: Sinus concern  Came to clinic about a month ago for sinus concerns, was given abx and these helped some but sinus congestion never really resolved. Coughing now, occasionally productive, worse with deep breaths. No fever. No SOB. Feels worse on L side of sinuses/face.      Past medical, surgical, social and family history reviewed:  Patient Active Problem List   Diagnosis Date Noted  . Prediabetes 01/12/2017  . Snoring 07/20/2016  . Obsessive-compulsive personality trait ([place]) 12/07/2015  . Morbid obesity ([place]) 09/19/2015  . Primary localized osteoarthritis of right knee   . Cardiac risk counseling 08/16/2015  . Essential hypertension 08/04/2015  . Hyperlipidemia 08/04/2015  . Osteopenia 08/04/2015  . Anxiety associated with depression 08/04/2015  . Right knee pain 06/14/2015  . Hyposomnia, insomnia or sleeplessness associated with anxiety 06/14/2015    Past Surgical History:  Procedure Laterality Date  . BREAST LUMPECTOMY Right 1997   Done in [place]  . CARPAL TUNNEL RELEASE Right 2015   Ortho in [place]  . LASIK    . MENISCUS REPAIR Right 2011   Orthopedic in [place]  . TOTAL KNEE ARTHROPLASTY Right 09/20/2015  . TOTAL KNEE ARTHROPLASTY Right 09/20/2015   Procedure: TOTAL KNEE ARTHROPLASTY;  Surgeon: Elsie Saas, MD;  Location: [place];  Service: Orthopedics;  Laterality: Right;  . Trigger Thumb Right 2015   Done in [facility] at the same time as the carpal tunnel release  . TUBAL LIGATION  1980    Social History   Tobacco Use  . Smoking status: Never Smoker  . Smokeless tobacco: Never Used  Substance Use Topics  . Alcohol use: No    Comment: 1 glass a month     Family History  Problem Relation Age of Onset  . Heart failure Mother   . Heart attack Mother   . Alcohol abuse Maternal Aunt   . Heart attack Father   . Irregular heart beat Sister   . Hypertension Sister   . Heart disease Brother      Current medication list and allergy/intolerance information reviewed:    Current Outpatient Medications  Medication Sig Dispense Refill  . amLODipine-benazepril (LOTREL) 5-20 MG capsule TAKE ONE CAPSULE BY MOUTH DAILY 90 capsule 0  . amLODipine-benazepril (LOTREL) 5-20 MG capsule TAKE ONE CAPSULE BY MOUTH DAILY 90 capsule 0  . atorvastatin (LIPITOR) 20 MG tablet Take 1 tablet (20 mg total) by mouth daily. 90 tablet 3  . clonazePAM (KLONOPIN) 0.5 MG tablet TAKE ONE TABLET BY MOUTH TWICE A DAY AS NEEDED FOR ANXIETY *DO NOT TAKE WITH TEMAZEPAM* 60 tablet 3  . fluticasone (FLONASE) 50 MCG/ACT nasal spray Place into both nostrils daily.    . mirtazapine (REMERON) 15 MG tablet TAKE ONE-HALF TO ONE TABLET BY MOUTH AT BEDTIME 90 tablet 3  . raloxifene (EVISTA) 60 MG tablet TAKE ONE TABLET BY MOUTH DAILY 90 tablet 1  . temazepam (RESTORIL) 30 MG capsule TAKE ONE CAPSULE BY MOUTH AT BEDTIME AS NEEDED FOR SLEEP *DO NOT TAKE WITH AMBIEN* 30 capsule 2  . triamterene-hydrochlorothiazide (DYAZIDE) 37.5-25 MG capsule TAKE ONE CAPSULE BY MOUTH DAILY 83 capsule 2  . venlafaxine XR (EFFEXOR-XR) 150 MG 24 hr capsule Take 1 capsule (150 mg total) by mouth daily  with breakfast. 90 capsule 3  . zolpidem (AMBIEN) 10 MG tablet Take 0.5-1 tablets (5-10 mg total) by mouth at bedtime as needed for sleep. Caution: do not take with other sedating substances 30 tablet 3   No current facility-administered medications for this visit.     Allergies  Allergen Reactions  . Penicillins Hives    Tolerates Cephalosporins   . Sulfa Antibiotics Hives           Review of Systems:  Constitutional:  No  fever, no chills, +recent illness, No unintentional weight changes.  +significant fatigue.   HEENT: No  headache, no vision change, no hearing change, No sore throat, +sinus pressure  Cardiac: No  chest pain, No  pressure, No palpitations  Respiratory:  No  shortness of breath. +Cough  Gastrointestinal: No  abdominal pain, No  nausea, No  vomiting,  No  blood in stool, No  diarrhea  Musculoskeletal: No new myalgia/arthralgia  Skin: No  Rash  Neurologic: No  weakness, No  dizziness  Exam:  BP (!) 154/60   Pulse 90   Temp 98.2 F (36.8 C) (Oral)   Wt 206 lb 0.6 oz (93.5 kg)   BMI 37.69 kg/m   Constitutional: VS see above. General Appearance: alert, well-developed, well-nourished, NAD  Head, Eyes: Normal lids and conjunctive, non-icteric sclera. (+)tenderness L maxillary/frontal sinuses   Ears, Nose, Mouth, Throat: MMM, Normal external inspection ears/nares/mouth/lips/gums. TM normal bilaterally. Pharynx/tonsils no erythema, no exudate. Nasal mucosa normal.   Neck: No masses, trachea midline. No tenderness/mass appreciated. No lymphadenopathy  Respiratory: Normal respiratory effort. no wheeze, no rhonchi, no rales  Cardiovascular: S1/S2 normal, no murmur, no rub/gallop auscultated. RRR. No lower extremity edema.   Gastrointestinal: Nontender, no masses. Bowel sounds normal.  Musculoskeletal: Gait normal.   Neurological: Normal balance/coordination. No tremor.   Skin: warm, dry, intact.   Psychiatric: Normal judgment/insight. Normal mood and affect.     ASSESSMENT/PLAN:   Acute maxillary sinusitis, recurrence not specified   Meds ordered this encounter  Medications  . doxycycline (VIBRA-TABS) 100 MG tablet    Sig: Take 1 tablet (100 mg total) by mouth 2 (two) times daily.    Dispense:  14 tablet    Refill:  0  . ipratropium (ATROVENT) 0.06 % nasal spray    Sig: Place 2 sprays into both nostrils 4 (four) times daily.    Dispense:  15 mL    Refill:  1  . benzonatate (TESSALON) 200 MG capsule    Sig: Take 1 capsule (200 mg  total) by mouth 3 (three) times daily as needed for cough.    Dispense:  30 capsule    Refill:  0       Visit summary with medication list and pertinent instructions was printed for patient to review. All questions at time of visit were answered - patient instructed to contact office with any additional concerns. ER/RTC precautions were reviewed with the patient.   Follow-up plan: Return if symptoms worsen or fail to improve, for medicare annual wellness chekcup in 3 months or so.    Please note: voice recognition software was used to produce this document, and typos may escape review. Please contact Dr. Sheppard Coil for any needed clarifications.

## 2017-05-14 ENCOUNTER — Other Ambulatory Visit: Payer: Self-pay | Admitting: Osteopathic Medicine

## 2017-05-14 ENCOUNTER — Encounter: Payer: Self-pay | Admitting: Osteopathic Medicine

## 2017-05-14 DIAGNOSIS — M8580 Other specified disorders of bone density and structure, unspecified site: Secondary | ICD-10-CM

## 2017-05-14 DIAGNOSIS — Z780 Asymptomatic menopausal state: Secondary | ICD-10-CM | POA: Insufficient documentation

## 2017-05-14 DIAGNOSIS — Z1231 Encounter for screening mammogram for malignant neoplasm of breast: Secondary | ICD-10-CM

## 2017-05-14 DIAGNOSIS — Z13820 Encounter for screening for osteoporosis: Secondary | ICD-10-CM

## 2017-05-16 ENCOUNTER — Other Ambulatory Visit: Payer: Self-pay | Admitting: Osteopathic Medicine

## 2017-05-16 DIAGNOSIS — F418 Other specified anxiety disorders: Secondary | ICD-10-CM

## 2017-05-21 ENCOUNTER — Telehealth: Payer: Self-pay | Admitting: Osteopathic Medicine

## 2017-05-21 DIAGNOSIS — Z13820 Encounter for screening for osteoporosis: Secondary | ICD-10-CM

## 2017-05-21 NOTE — Telephone Encounter (Signed)
DEXA scan

## 2017-05-27 ENCOUNTER — Encounter: Payer: Self-pay | Admitting: Osteopathic Medicine

## 2017-05-27 ENCOUNTER — Other Ambulatory Visit: Payer: Self-pay | Admitting: Osteopathic Medicine

## 2017-05-28 ENCOUNTER — Encounter: Payer: Self-pay | Admitting: Osteopathic Medicine

## 2017-05-28 MED ORDER — ATORVASTATIN CALCIUM 20 MG PO TABS: 20.0000 mg | ORAL_TABLET | Freq: Every day | ORAL | 2 refills | Status: DC

## 2017-05-28 NOTE — Telephone Encounter (Signed)
Left a brief vm msg for pt regarding med refill. Call back information provided.

## 2017-05-28 NOTE — Telephone Encounter (Signed)
[place] requesting med refill for zolpidem.

## 2017-06-04 ENCOUNTER — Ambulatory Visit: Payer: Self-pay

## 2017-06-09 ENCOUNTER — Other Ambulatory Visit: Payer: Self-pay

## 2017-06-09 ENCOUNTER — Emergency Department (INDEPENDENT_AMBULATORY_CARE_PROVIDER_SITE_OTHER)
Admission: EM | Admit: 2017-06-09 | Discharge: 2017-06-09 | Disposition: A | Discharge status: Home / Self Care | Payer: Medicare Other | Source: Home / Self Care

## 2017-06-09 DIAGNOSIS — J324 Chronic pansinusitis: Secondary | ICD-10-CM | POA: Diagnosis not present

## 2017-06-09 DIAGNOSIS — J069 Acute upper respiratory infection, unspecified: Secondary | ICD-10-CM

## 2017-06-09 MED ORDER — CEFDINIR 300 MG PO CAPS: 600.0000 mg | ORAL_CAPSULE | Freq: Every day | ORAL | 0 refills | Status: DC

## 2017-06-09 MED ORDER — HYDROCODONE-HOMATROPINE 5-1.5 MG/5ML PO SYRP: 5.0000 mL | ORAL_SOLUTION | Freq: Three times a day (TID) | ORAL | 0 refills | Status: DC

## 2017-06-09 MED ORDER — PREDNISONE 20 MG PO TABS: 40.0000 mg | ORAL_TABLET | Freq: Every day | ORAL | 0 refills | Status: DC

## 2017-06-09 NOTE — ED Provider Notes (Signed)
[facility]    CSN: [id number] Arrival date & time: 06/09/17  8676   History   Chief Complaint Chief Complaint  Patient presents with  . Nasal Congestion  . Cough  . Facial Pain    HPI [name] is a 72 y.o. female.  Presents today with a complaint of recurrent sinus infection and a barky type cough.  She reports that since December she has had intermittent sinusitis and has been treated with Keflex and doxycycline just only temporarily relieved symptoms.  She denies any known history of environmental or seasonal allergies, asthma, or COPD. She reports facial pressure, persistent dull headache, sore throat, and occasionally productive cough she characterizes as barky. She endorses chills and sweats. Although has been negative of fever, nausea, vomiting, diarrhea, abdominal pain, body aches, wheezing, shortness of breath, or chest tightness.  She has tried over-the-counter antihistamine preparations without relief of congestion and  prescribed benzonatate for cough without improvement of symptoms. Past Medical History:  Diagnosis Date  . Anxiety   . Depression   . Headache    SINUS  . Hyperlipidemia   . Hypertension   . Osteopenia   . Primary localized osteoarthritis of right knee     Patient Active Problem List   Diagnosis Date Noted  . Post-menopausal 05/14/2017  . Prediabetes 01/12/2017  . Snoring 07/20/2016  . Obsessive-compulsive personality trait 12/07/2015  . Morbid obesity ([place]) 09/19/2015  . Primary localized osteoarthritis of right knee   . Cardiac risk counseling 08/16/2015  . Essential hypertension 08/04/2015  . Hyperlipidemia 08/04/2015  . Osteopenia 08/04/2015  . Anxiety associated with depression 08/04/2015  . Right knee pain 06/14/2015  . Hyposomnia, insomnia or sleeplessness associated with anxiety 06/14/2015    Past Surgical History:  Procedure Laterality Date  . BREAST LUMPECTOMY Right 1997   Done in [place]  . CARPAL TUNNEL  RELEASE Right 2015   Ortho in [place]  . LASIK    . MENISCUS REPAIR Right 2011   Orthopedic in [place]  . TOTAL KNEE ARTHROPLASTY Right 09/20/2015  . TOTAL KNEE ARTHROPLASTY Right 09/20/2015   Procedure: TOTAL KNEE ARTHROPLASTY;  Surgeon: Elsie Saas, MD;  Location: [facility];  Service: Orthopedics;  Laterality: Right;  . Trigger Thumb Right 2015   Done in [facility] at the same time as the carpal tunnel release  . TUBAL LIGATION  1980    OB History   None      Home Medications    Prior to Admission medications   Medication Sig Start Date End Date Taking? Authorizing Provider  amLODipine-benazepril (LOTREL) 5-20 MG capsule TAKE ONE CAPSULE BY MOUTH DAILY 12/26/16   Emeterio Reeve, DO  amLODipine-benazepril (LOTREL) 5-20 MG capsule TAKE ONE CAPSULE BY MOUTH DAILY 04/09/17   Emeterio Reeve, DO  atorvastatin (LIPITOR) 20 MG tablet Take 1 tablet (20 mg total) by mouth daily. 05/28/17   Emeterio Reeve, DO  benzonatate (TESSALON) 200 MG capsule Take 1 capsule (200 mg total) by mouth 3 (three) times daily as needed for cough. 04/20/17   Emeterio Reeve, DO  clonazePAM (KLONOPIN) 0.5 MG tablet TAKE 1 TABLET BY MOUTH TWICE DAILY AS NEEDED FOR ANXIETY( DO NOT TAKE WITH TEMAZEPAM) 05/17/17   Silverio Decamp, MD  doxycycline (VIBRA-TABS) 100 MG tablet Take 1 tablet (100 mg total) by mouth 2 (two) times daily. 04/20/17   Emeterio Reeve, DO  fluticasone [facility]) 50 MCG/ACT nasal spray Place into both nostrils daily.    [provider]  ipratropium (ATROVENT) 0.06 %  nasal spray Place 2 sprays into both nostrils 4 (four) times daily. 04/20/17   Emeterio Reeve, DO  mirtazapine (REMERON) 15 MG tablet TAKE ONE-HALF TO ONE TABLET BY MOUTH AT BEDTIME 01/24/17   Emeterio Reeve, DO  raloxifene (EVISTA) 60 MG tablet TAKE ONE TABLET BY MOUTH DAILY 02/21/17   Emeterio Reeve, DO  temazepam (RESTORIL) 30 MG capsule TAKE ONE CAPSULE BY MOUTH AT BEDTIME AS NEEDED FOR SLEEP *DO NOT  TAKE WITH AMBIEN* 03/16/17   Emeterio Reeve, DO  triamterene-hydrochlorothiazide (DYAZIDE) 37.5-25 MG capsule TAKE ONE CAPSULE BY MOUTH DAILY 12/26/16   Emeterio Reeve, DO  venlafaxine XR (EFFEXOR-XR) 150 MG 24 hr capsule Take 1 capsule (150 mg total) by mouth daily with breakfast. 03/07/17   Emeterio Reeve, DO  zolpidem (AMBIEN) 10 MG tablet TAKE ONE-HALF TO ONE TABLET BY MOUTH AT BEDTIME AS NEEDED FOR SLEEP **CAUTION: DO NOT TAKE WITH OTHER SEDATING SUBSTANCES** 05/28/17   Emeterio Reeve, DO    Family History Family History  Problem Relation Age of Onset  . Heart failure Mother   . Heart attack Mother   . Alcohol abuse Maternal Aunt   . Heart attack Father   . Irregular heart beat Sister   . Hypertension Sister   . Heart disease Brother     Social History Social History   Tobacco Use  . Smoking status: Never Smoker  . Smokeless tobacco: Never Used  Substance Use Topics  . Alcohol use: No    Comment: 1 glass a month  . Drug use: No     Allergies   Penicillins and Sulfa antibiotics   Review of Systems Review of Systems  Constitutional: Positive for chills and fatigue.  HENT: Positive for congestion, postnasal drip, rhinorrhea, sinus pressure, sneezing and sore throat.   Respiratory: Positive for cough. Negative for chest tightness, shortness of breath and wheezing.   Cardiovascular: Negative.   Gastrointestinal: Negative.   Musculoskeletal: Negative.   Skin: Negative.   Neurological: Positive for headaches.  Hematological: Negative.   Psychiatric/Behavioral: Negative.   Physical Exam Triage Vital Signs ED Triage Vitals  Enc Vitals Group     BP 06/09/17 0923 139/78     Pulse Rate 06/09/17 0923 86     Resp 06/09/17 0923 18     Temp 06/09/17 0923 98.4 F (36.9 C)     Temp Source 06/09/17 0923 Oral     SpO2 06/09/17 0923 97 %     Weight 06/09/17 0925 200 lb (90.7 kg)     Height 06/09/17 0925 5\' 2"  (1.575 m)     Head Circumference --      Peak Flow  --      Pain Score 06/09/17 0924 2     Pain Loc --      Pain Edu? --      Excl. in [place]? --    No data found.  Updated Vital Signs BP 139/78 (BP Location: Right Arm)   Pulse 86   Temp 98.4 F (36.9 C) (Oral)   Resp 18   Ht 5\' 2"  (1.575 m)   Wt 200 lb (90.7 kg)   SpO2 97%   BMI 36.58 kg/m   Visual Acuity Right Eye Distance:   Left Eye Distance:   Bilateral Distance:    Right Eye Near:   Left Eye Near:    Bilateral Near:     Physical Exam  Constitutional: She is oriented to person, place, and time. She appears well-nourished. She appears ill. No distress.  HENT:  Head: Normocephalic and atraumatic.  Right Ear: External ear normal.  Left Ear: External ear normal.  Nose: Mucosal edema and rhinorrhea present. Right sinus exhibits maxillary sinus tenderness. Left sinus exhibits maxillary sinus tenderness.  Mouth/Throat: Oropharynx is clear and moist.  Cardiovascular: Normal rate, regular rhythm and normal heart sounds.  Pulmonary/Chest: Effort normal. No respiratory distress. She has no wheezes. She exhibits no tenderness.  Coarse lungs sound noted at bases of lungs bilaterally. Harsh barky non-productive cough noted on exam  Abdominal: Soft. Bowel sounds are normal.  Musculoskeletal: Normal range of motion.  Lymphadenopathy:    She has no cervical adenopathy.  Neurological: She is alert and oriented to person, place, and time.  Skin: Skin is warm and dry. She is not diaphoretic.   UC Treatments / Results  Labs (all labs ordered are listed, but only abnormal results are displayed) Labs Reviewed - No data to display  EKG None Radiology No results found.  Procedures Procedures (including critical care time)  Medications Ordered in UC Medications - No data to display   Initial Impression / Assessment and Plan / UC Course  I have reviewed the triage vital signs and the nursing notes.  Pertinent labs & imaging results that were available during my care of the  patient were reviewed by me and considered in my medical decision making (see chart for details).   Patient appears nontoxic although is ill-appearing.  She has suffered from sinus infections periodically over the course of the last 5 months and has been treated with 2 rounds of broad-spectrum antibiotics including Keflex and doxycycline.  Last treatment occurred in early March of this year.  Patient had relief after antibiotic treatment however symptoms return. Suspect patient has chronic sinusitis which is exacerbated by current high levels of pollen.  She is also experiencing a worrisome cough is occasionally productive and on auscultation of lungs I did note rhonchi and coarse lung sounds which I suspect secondary to persistent postnasal drainage. Coarseness of lung sounds cleared with cough.  She has remained afebrile although complains of some chills and mild fatigue, although severity is not interfering with routine ADLs. At this point I am not concerned for pneumonia or influenza. Coarseness of the lung sounds I suspect patient could be developing acute bronchitis, therefore I will add prednisone 40 mg with breakfast x5 days.  However due to the chronicity of sinus symptoms I will trial again of broad-spectrum antibiotic-cefdinir 300 mg twice daily x10 days. Patient has a penicillin allergy although reports that she can tolerate cephalosporins and she has taken Keflex within the last 5 months without issue. The patient was given clear instructions to go to ER or return to medical center if symptoms do not improve, worsen or new problems develop. The patient verbalized understanding. Final Clinical Impressions(s) / UC Diagnoses   Final diagnoses:  Chronic pansinusitis  Viral upper respiratory tract infection    ED Discharge Orders        Ordered    cefdinir (OMNICEF) 300 MG capsule  Daily     06/09/17 0934    predniSONE (DELTASONE) 20 MG tablet  Daily with breakfast     06/09/17 0934     HYDROcodone-homatropine (HYCODAN) 5-1.5 MG/5ML syrup  Every 8 hours     06/09/17 0934       Controlled Substance Prescriptions [place] Controlled Substance Registry consulted? Yes, I have consulted the  Controlled Substances Registry for this patient, and feel the risk/benefit ratio today is favorable for  proceeding with this prescription for a controlled substance.   Scot Jun, FNP 06/09/17 1045

## 2017-06-09 NOTE — Discharge Instructions (Addendum)
If symptoms worsens or do not improve, follow-up immediately with your PCP or return here for care. Take medication as prescribed. Rest and hydrate well with water.

## 2017-06-09 NOTE — ED Triage Notes (Signed)
Reports 6 days of congestion, facial pressure, cough, chills and some nausea. No OTC today.

## 2017-06-13 ENCOUNTER — Ambulatory Visit
Admission: RE | Admit: 2017-06-13 | Discharge: 2017-06-13 | Disposition: A | Discharge status: Home / Self Care | Payer: Medicare Other | Source: Ambulatory Visit | Attending: Physician Assistant | Admitting: Physician Assistant

## 2017-06-13 ENCOUNTER — Ambulatory Visit
Admission: RE | Admit: 2017-06-13 | Discharge: 2017-06-13 | Disposition: A | Discharge status: Home / Self Care | Payer: Medicare Other | Source: Ambulatory Visit | Attending: Osteopathic Medicine | Admitting: Osteopathic Medicine

## 2017-06-13 ENCOUNTER — Other Ambulatory Visit: Payer: Self-pay | Admitting: Sports Medicine

## 2017-06-13 DIAGNOSIS — Z1231 Encounter for screening mammogram for malignant neoplasm of breast: Secondary | ICD-10-CM | POA: Diagnosis not present

## 2017-06-13 DIAGNOSIS — F418 Other specified anxiety disorders: Secondary | ICD-10-CM

## 2017-06-13 DIAGNOSIS — M8589 Other specified disorders of bone density and structure, multiple sites: Secondary | ICD-10-CM | POA: Diagnosis not present

## 2017-06-14 ENCOUNTER — Encounter: Payer: Self-pay | Admitting: Osteopathic Medicine

## 2017-06-14 ENCOUNTER — Ambulatory Visit (INDEPENDENT_AMBULATORY_CARE_PROVIDER_SITE_OTHER): Payer: Medicare Other | Admitting: Osteopathic Medicine

## 2017-06-14 ENCOUNTER — Ambulatory Visit (INDEPENDENT_AMBULATORY_CARE_PROVIDER_SITE_OTHER): Payer: Medicare Other

## 2017-06-14 VITALS — BP 121/59 | HR 83 | Temp 98.5°F | Ht 62.0 in | Wt 204.0 lb

## 2017-06-14 DIAGNOSIS — J0100 Acute maxillary sinusitis, unspecified: Secondary | ICD-10-CM

## 2017-06-14 DIAGNOSIS — R05 Cough: Secondary | ICD-10-CM

## 2017-06-14 DIAGNOSIS — R059 Cough, unspecified: Secondary | ICD-10-CM

## 2017-06-14 DIAGNOSIS — J329 Chronic sinusitis, unspecified: Secondary | ICD-10-CM | POA: Diagnosis not present

## 2017-06-14 MED ORDER — HYDROCODONE-HOMATROPINE 5-1.5 MG/5ML PO SYRP: 5.0000 mL | ORAL_SOLUTION | Freq: Four times a day (QID) | ORAL | 0 refills | Status: DC | PRN

## 2017-06-14 MED ORDER — LEVOFLOXACIN 500 MG PO TABS: 500.0000 mg | ORAL_TABLET | Freq: Every day | ORAL | 0 refills | Status: DC

## 2017-06-14 NOTE — Patient Instructions (Addendum)
Plan:  Stop Omnicef antibiotic  Start Levofloxacin antibiotic   Continue cough medicine and home remedies as below  Chest Xray today to rule out pneumonia  CT sinuses to evaluate possible causes of recurrent infection  ENT referral for further evaluation based on CT results  Continue daily Flonase

## 2017-06-14 NOTE — Progress Notes (Signed)
HPI: [name] is a 72 y.o. female who  has a past medical history of Anxiety, Depression, Headache, Hyperlipidemia, Hypertension, Osteopenia, and Primary localized osteoarthritis of right knee.  she presents to [facility] today, 06/14/17,  for chief complaint of:  Persistent sinus illness  06/09/17 was in urgent care for similar. Treated w/ omnicef, prednisone, hycodan. No CXR was needed.  Presents today with persistent sinus issues and cough, UC treatments seem to not have made any difference. No fever/chills. Cough and sinus pressure still bothering her.   Has been having sinus problems on and off since this winter. Abx have provided some relief but symptoms persist/recur.     Past medical history, surgical history, and family history reviewed.  Current medication list and allergy/intolerance information reviewed.   (See remainder of HPI, as well as ROS, PE below)   CXR reviewed:  Possible LLL PNA, await radiology over-read   Dg Chest 2 View  Result Date: 06/14/2017 CLINICAL DATA:  Cough for 5 days, history of recurrent sinusitis EXAM: CHEST - 2 VIEW COMPARISON:  Chest x-ray of 04/04/2016 FINDINGS: No active infiltrate or effusion is seen. There is some peribronchial thickening however which may indicate bronchitis. Mediastinal and hilar contours are unremarkable. The heart is within upper limits of normal. No acute bony abnormality is seen. IMPRESSION: 1. No pneumonia or effusion. 2. Cannot exclude bronchitis. Electronically Signed   By: Ivar Drape M.D.   On: 06/14/2017 11:40      ASSESSMENT/PLAN: The primary encounter diagnosis was Recurrent sinus infections. Diagnoses of Acute maxillary sinusitis, recurrence not specified and Cough were also pertinent to this visit.  CXR ok per radiology, levaquin will cover PNA either way.    Meds ordered this encounter  Medications  . HYDROcodone-homatropine (HYCODAN) 5-1.5 MG/5ML syrup    Sig:  Take 5 mLs by mouth every 6 (six) hours as needed for cough.    Dispense:  120 mL    Refill:  0  . levofloxacin (LEVAQUIN) 500 MG tablet    Sig: Take 1 tablet (500 mg total) by mouth daily.    Dispense:  7 tablet    Refill:  0    Patient Instructions  Plan:  Stop Omnicef antibiotic  Start Levofloxacin antibiotic   Continue cough medicine and home remedies as below  Chest Xray today to rule out pneumonia  CT sinuses to evaluate possible causes of recurrent infection  ENT referral for further evaluation based on CT results  Continue daily Flonase    Follow-up plan: Return if symptoms worsen or fail to improve.          ################################ ################################ ################################   Outpatient Encounter Medications as of 06/14/2017  Medication Sig  . amLODipine-benazepril (LOTREL) 5-20 MG capsule TAKE ONE CAPSULE BY MOUTH DAILY  . amLODipine-benazepril (LOTREL) 5-20 MG capsule TAKE ONE CAPSULE BY MOUTH DAILY  . atorvastatin (LIPITOR) 20 MG tablet Take 1 tablet (20 mg total) by mouth daily.  . cefdinir (OMNICEF) 300 MG capsule Take 2 capsules (600 mg total) by mouth daily.  . clonazePAM (KLONOPIN) 0.5 MG tablet TAKE 1 TABLET BY MOUTH TWICE DAILY AS NEEDED FOR ANXIETY. DO NOT TAKE WITH TEMAZEPAM  . fluticasone (FLONASE) 50 MCG/ACT nasal spray Place into both nostrils daily.  Marland Kitchen HYDROcodone-homatropine (HYCODAN) 5-1.5 MG/5ML syrup Take 5 mLs by mouth every 8 (eight) hours.  Marland Kitchen ipratropium (ATROVENT) 0.06 % nasal spray Place 2 sprays into both nostrils 4 (four) times daily.  . mirtazapine (REMERON) 15  MG tablet TAKE ONE-HALF TO ONE TABLET BY MOUTH AT BEDTIME  . predniSONE (DELTASONE) 20 MG tablet Take 2 tablets (40 mg total) by mouth daily with breakfast for 5 days.  . raloxifene (EVISTA) 60 MG tablet TAKE ONE TABLET BY MOUTH DAILY  . temazepam (RESTORIL) 30 MG capsule TAKE ONE CAPSULE BY MOUTH AT BEDTIME AS NEEDED FOR SLEEP *DO NOT  TAKE WITH AMBIEN*  . triamterene-hydrochlorothiazide (DYAZIDE) 37.5-25 MG capsule TAKE ONE CAPSULE BY MOUTH DAILY  . venlafaxine XR (EFFEXOR-XR) 150 MG 24 hr capsule Take 1 capsule (150 mg total) by mouth daily with breakfast.  . zolpidem (AMBIEN) 10 MG tablet TAKE ONE-HALF TO ONE TABLET BY MOUTH AT BEDTIME AS NEEDED FOR SLEEP **CAUTION: DO NOT TAKE WITH OTHER SEDATING SUBSTANCES**   No facility-administered encounter medications on file as of 06/14/2017.    Allergies  Allergen Reactions  . Penicillins Hives    Tolerates Cephalosporins   . Sulfa Antibiotics Hives           Review of Systems:  Constitutional: +recent illness  HEENT: +headache, no vision change, +ENT issues as per HPI  Cardiac: No  chest pain, No  pressure, No palpitations  Respiratory:  No  shortness of breath. +Cough  Gastrointestinal: No  abdominal pain, no change on bowel habits  Musculoskeletal: No new myalgia/arthralgia  Skin: No  Rash   Exam:  BP (!) 121/59   Pulse 83   Temp 98.5 F (36.9 C)   Ht 5\' 2"  (1.575 m)   Wt 204 lb (92.5 kg)   SpO2 97%   BMI 37.31 kg/m   Constitutional: VS see above. General Appearance: alert, well-developed, well-nourished, NAD  Eyes: Normal lids and conjunctive, non-icteric sclera  Ears, Nose, Mouth, Throat: MMM, Normal external inspection ears/nares/mouth/lips/gums. TM normal bilateally. Pharynx normal.   Neck: No masses, trachea midline. Ny lymphadenopathy.   Respiratory: Normal respiratory effort. no wheeze, no rhonchi, no rales. Coarse breath sounds very faint bilaterally.   Cardiovascular: S1/S2 normal, no murmur, no rub/gallop auscultated. RRR.   Musculoskeletal: Gait normal. Symmetric and independent movement of all extremities  Neurological: Normal balance/coordination. No tremor.  Skin: warm, dry, intact.   Psychiatric: Normal judgment/insight. Normal mood and affect. Oriented x3.   Visit summary with medication list and pertinent instructions  was printed for patient to review, alert [place] if any changes needed. All questions at time of visit were answered - patient instructed to contact office with any additional concerns. ER/RTC precautions were reviewed with the patient and understanding verbalized.   Follow-up plan: Return if symptoms worsen or fail to improve.   Please note: voice recognition software was used to produce this document, and typos may escape review. Please contact Dr. Sheppard Coil for any needed clarifications.

## 2017-06-14 NOTE — Telephone Encounter (Signed)
To PCP

## 2017-06-15 NOTE — Addendum Note (Signed)
Addended by: Maryla Morrow on: 06/15/2017 12:50 PM   Modules accepted: Orders

## 2017-06-21 DIAGNOSIS — J322 Chronic ethmoidal sinusitis: Secondary | ICD-10-CM | POA: Diagnosis not present

## 2017-06-21 DIAGNOSIS — J320 Chronic maxillary sinusitis: Secondary | ICD-10-CM | POA: Diagnosis not present

## 2017-06-21 DIAGNOSIS — J410 Simple chronic bronchitis: Secondary | ICD-10-CM | POA: Diagnosis not present

## 2017-06-22 ENCOUNTER — Other Ambulatory Visit: Payer: Self-pay | Admitting: Osteopathic Medicine

## 2017-06-26 DIAGNOSIS — J410 Simple chronic bronchitis: Secondary | ICD-10-CM | POA: Diagnosis not present

## 2017-06-26 DIAGNOSIS — J322 Chronic ethmoidal sinusitis: Secondary | ICD-10-CM | POA: Diagnosis not present

## 2017-06-26 DIAGNOSIS — J320 Chronic maxillary sinusitis: Secondary | ICD-10-CM | POA: Diagnosis not present

## 2017-06-28 ENCOUNTER — Other Ambulatory Visit: Payer: Self-pay | Admitting: Osteopathic Medicine

## 2017-06-28 ENCOUNTER — Encounter: Payer: Self-pay | Admitting: Osteopathic Medicine

## 2017-06-28 DIAGNOSIS — F5101 Primary insomnia: Secondary | ICD-10-CM

## 2017-06-28 NOTE — Telephone Encounter (Signed)
Left a detailed vm msg for pt re: med RF. Call back information provided.

## 2017-06-28 NOTE — Telephone Encounter (Signed)
[facility] pharmacy requesting med RF for temazepam.

## 2017-07-15 ENCOUNTER — Other Ambulatory Visit: Payer: Self-pay | Admitting: Osteopathic Medicine

## 2017-07-15 DIAGNOSIS — F418 Other specified anxiety disorders: Secondary | ICD-10-CM

## 2017-07-16 NOTE — Telephone Encounter (Signed)
[facility] requesting RF on pt's Clonazepam.   Last refilled 06-14-17 for #60 with no RF.  RX pended, please review and send if appropriate.  Thanks!

## 2017-07-16 NOTE — Telephone Encounter (Signed)
Sent. Thanks.   

## 2017-07-26 ENCOUNTER — Other Ambulatory Visit: Payer: Self-pay | Admitting: Osteopathic Medicine

## 2017-07-26 DIAGNOSIS — F5101 Primary insomnia: Secondary | ICD-10-CM

## 2017-07-26 NOTE — Telephone Encounter (Signed)
[facility] pharmacy requesting med RFs for temazepam. Previous request in May was denied. Pls advise, thanks.

## 2017-07-27 NOTE — Telephone Encounter (Signed)
Pt has been updated.  

## 2017-08-12 ENCOUNTER — Other Ambulatory Visit: Payer: Self-pay | Admitting: Osteopathic Medicine

## 2017-08-12 DIAGNOSIS — F418 Other specified anxiety disorders: Secondary | ICD-10-CM

## 2017-08-22 ENCOUNTER — Other Ambulatory Visit: Payer: Self-pay | Admitting: Osteopathic Medicine

## 2017-08-22 DIAGNOSIS — F5101 Primary insomnia: Secondary | ICD-10-CM

## 2017-08-22 NOTE — Telephone Encounter (Signed)
[facility] requesting RF on Temazepam 30mg .   Last RX sent 07-26-17 with no RF.  Pt also has Clonazepam and Ambien on her med list.   RX pended. Please review and send refill if appropriate

## 2017-08-24 ENCOUNTER — Other Ambulatory Visit: Payer: Self-pay | Admitting: Osteopathic Medicine

## 2017-08-24 DIAGNOSIS — M8580 Other specified disorders of bone density and structure, unspecified site: Secondary | ICD-10-CM

## 2017-09-10 ENCOUNTER — Encounter: Payer: Self-pay | Admitting: Osteopathic Medicine

## 2017-09-10 MED ORDER — ZOLPIDEM TARTRATE 10 MG PO TABS: 10.0000 mg | ORAL_TABLET | Freq: Every evening | ORAL | 2 refills | Status: DC | PRN

## 2017-09-11 ENCOUNTER — Other Ambulatory Visit: Payer: Self-pay | Admitting: Osteopathic Medicine

## 2017-09-11 DIAGNOSIS — F418 Other specified anxiety disorders: Secondary | ICD-10-CM

## 2017-09-17 ENCOUNTER — Ambulatory Visit (INDEPENDENT_AMBULATORY_CARE_PROVIDER_SITE_OTHER): Payer: Medicare Other | Admitting: Osteopathic Medicine

## 2017-09-17 ENCOUNTER — Encounter: Payer: Self-pay | Admitting: Osteopathic Medicine

## 2017-09-17 VITALS — BP 133/53 | HR 73 | Temp 98.5°F | Wt 202.7 lb

## 2017-09-17 DIAGNOSIS — M8580 Other specified disorders of bone density and structure, unspecified site: Secondary | ICD-10-CM | POA: Diagnosis not present

## 2017-09-17 DIAGNOSIS — M19049 Primary osteoarthritis, unspecified hand: Secondary | ICD-10-CM | POA: Diagnosis not present

## 2017-09-17 DIAGNOSIS — D509 Iron deficiency anemia, unspecified: Secondary | ICD-10-CM

## 2017-09-17 DIAGNOSIS — F418 Other specified anxiety disorders: Secondary | ICD-10-CM | POA: Diagnosis not present

## 2017-09-17 DIAGNOSIS — I10 Essential (primary) hypertension: Secondary | ICD-10-CM

## 2017-09-17 DIAGNOSIS — F5101 Primary insomnia: Secondary | ICD-10-CM | POA: Diagnosis not present

## 2017-09-17 DIAGNOSIS — R7301 Impaired fasting glucose: Secondary | ICD-10-CM | POA: Diagnosis not present

## 2017-09-17 NOTE — Patient Instructions (Signed)
OK to increase Ibuprofen to 400 mg few times per day  OK to come off Mirtazapine  Let me know if you need another shingles vaccine Rx Will monitor basic labs today

## 2017-09-17 NOTE — Progress Notes (Signed)
HPI: [name] is a 72 y.o. female who  has a past medical history of Anxiety, Depression, Headache, Hyperlipidemia, Hypertension, Osteopenia, and Primary localized osteoarthritis of right knee.  she presents to [facility] today, 09/17/17,  for chief complaint of:  Recheck insomnia, continue medications  See headings below  Insomnia: Currently controlled on medications as below, switching back and forth between Ambien and temazepam seems to be working pretty well for her.  Depression/weight gain: Would like to come off of the Remeron.  She has been tapering down a bit on this already.  Hand arthritis: Has been taking 200 mg of ibuprofen a few times a day, this is helping a bit but she would like to know she can go up on the dose of this medicine.  Recheck sinuses:  CT Sinus 06/14/17 slight maxillary edema but nothing else pertinent on imaging. ENT referral at that time.  She was able to get set up with ENT, no major issues since  Other:  We were keeping an eye on mild anemia/iron levels and pre-DM range A1C.      Past medical history, surgical history, and family history reviewed.  Current medication list and allergy/intolerance information reviewed.   (See remainder of HPI, ROS, Phys Exam below)    ASSESSMENT/PLAN: The primary encounter diagnosis was Primary insomnia. Diagnoses of Elevated fasting glucose, Microcytic anemia, Osteopenia, unspecified location, Essential hypertension, Anxiety associated with depression, and Hand arthritis were also pertinent to this visit.  Orders Placed This Encounter  Procedures  . Hemoglobin A1c  . COMPLETE METABOLIC PANEL WITH GFR  . CBC  . Fe+TIBC+Fer    Patient Instructions  OK to increase Ibuprofen to 400 mg few times per day  OK to come off Mirtazapine  Let me know if you need another shingles vaccine Rx Will monitor basic labs today    Follow-up plan: Return in about 6 months (around  03/20/2018) for annual physical, sooner if needed. Flu shot recommneded in the fall .                ##################################################### ###################################################### ####################################################### ########################################################    Outpatient Encounter Medications as of 09/17/2017  Medication Sig  . amLODipine-benazepril (LOTREL) 5-20 MG capsule TAKE ONE CAPSULE BY MOUTH DAILY  . atorvastatin (LIPITOR) 20 MG tablet Take 1 tablet (20 mg total) by mouth daily.  . clonazePAM (KLONOPIN) 0.5 MG tablet TAKE 1 TABLET BY MOUTH TWICE DAILY AS NEEDED FOR ANXIETY. DO NOT TAKE WITH TEMAZEPAM  . fluticasone (FLONASE) 50 MCG/ACT nasal spray Place into both nostrils daily.  . mirtazapine (REMERON) 15 MG tablet TAKE ONE-HALF TO ONE TABLET BY MOUTH AT BEDTIME  . raloxifene (EVISTA) 60 MG tablet TAKE ONE TABLET BY MOUTH DAILY  . temazepam (RESTORIL) 30 MG capsule TAKE 1 CAPSULE BY MOUTH AT BEDTIME AS NEEDED FOR SLEEP. DO NOT TAKE WITH AMBIEN!!!  . triamterene-hydrochlorothiazide (DYAZIDE) 37.5-25 MG capsule TAKE ONE CAPSULE BY MOUTH DAILY  . venlafaxine XR (EFFEXOR-XR) 150 MG 24 hr capsule Take 1 capsule (150 mg total) by mouth daily with breakfast.  . zolpidem (AMBIEN) 10 MG tablet Take 1 tablet (10 mg total) by mouth at bedtime as needed for sleep.  . [DISCONTINUED] cefdinir (OMNICEF) 300 MG capsule Take 2 capsules (600 mg total) by mouth daily. (Patient not taking: Reported on 09/17/2017)  . [DISCONTINUED] HYDROcodone-homatropine (HYCODAN) 5-1.5 MG/5ML syrup Take 5 mLs by mouth every 6 (six) hours as needed for cough. (Patient not taking: Reported on 09/17/2017)  . [  DISCONTINUED] ipratropium (ATROVENT) 0.06 % nasal spray Place 2 sprays into both nostrils 4 (four) times daily. (Patient not taking: Reported on 09/17/2017)  . [DISCONTINUED] levofloxacin (LEVAQUIN) 500 MG tablet Take 1 tablet (500 mg total) by  mouth daily. (Patient not taking: Reported on 09/17/2017)   No facility-administered encounter medications on file as of 09/17/2017.    Allergies  Allergen Reactions  . Penicillins Hives    Tolerates Cephalosporins   . Sulfa Antibiotics Hives           Review of Systems:  Constitutional: No recent illness, + weight gain   HEENT: No  headache  Cardiac: No  chest pain, No  pressure, No palpitations  Respiratory:  No  shortness of breath. No  Cough  Gastrointestinal: No  abdominal pain  Musculoskeletal: + myalgia/arthralgia  Skin: No  Rash  Neurologic: No  weakness, No  Dizziness  Psychiatric: No  concerns with depression, No  concerns with anxiety, sleeping problems are controlled  Exam:  BP (!) 133/53 (BP Location: Left Arm, Patient Position: Sitting, Cuff Size: Large)   Pulse 73   Temp 98.5 F (36.9 C) (Oral)   Wt 202 lb 11.2 oz (91.9 kg)   BMI 37.07 kg/m   Constitutional: VS see above. General Appearance: alert, well-developed, well-nourished, NAD  Eyes: Normal lids and conjunctive, non-icteric sclera  Ears, Nose, Mouth, Throat: MMM, Normal external inspection ears/nares/mouth/lips/gums.  Neck: No masses, trachea midline.   Respiratory: Normal respiratory effort. no wheeze, no rhonchi, no rales  Cardiovascular: S1/S2 normal, no murmur, no rub/gallop auscultated. RRR.   Musculoskeletal: Gait normal. Symmetric and independent movement of all extremities  Neurological: Normal balance/coordination. No tremor.  Skin: warm, dry, intact.   Psychiatric: Normal judgment/insight. Normal mood and affect. Oriented x3.   Visit summary with medication list and pertinent instructions was printed for patient to review, advised to alert [place] if any changes needed. All questions at time of visit were answered - patient instructed to contact office with any additional concerns. ER/RTC precautions were reviewed with the patient and understanding verbalized.   Follow-up plan:  Return in about 6 months (around 03/20/2018) for annual physical, sooner if needed. Flu shot recommneded in the fall .  Note: Total time spent 25 minutes, greater than 50% of the visit was spent face-to-face counseling and coordinating care for the following: The primary encounter diagnosis was Primary insomnia. Diagnoses of Elevated fasting glucose, Microcytic anemia, Osteopenia, unspecified location, Essential hypertension, Anxiety associated with depression, and Hand arthritis were also pertinent to this visit.Marland Kitchen  Please note: voice recognition software was used to produce this document, and typos may escape review. Please contact Dr. Sheppard Coil for any needed clarifications.

## 2017-09-18 LAB — COMPLETE METABOLIC PANEL WITH GFR
AG RATIO: 1.5 (calc) (ref 1.0–2.5)
ALBUMIN MSPROF: 4.5 g/dL (ref 3.6–5.1)
ALKALINE PHOSPHATASE (APISO): 98 U/L (ref 33–130)
ALT: 16 U/L (ref 6–29)
AST: 17 U/L (ref 10–35)
BILIRUBIN TOTAL: 0.3 mg/dL (ref 0.2–1.2)
BUN/Creatinine Ratio: 20 (calc) (ref 6–22)
BUN: 24 mg/dL (ref 7–25)
CHLORIDE: 101 mmol/L (ref 98–110)
CO2: 26 mmol/L (ref 20–32)
Calcium: 9.9 mg/dL (ref 8.6–10.4)
Creat: 1.23 mg/dL — ABNORMAL HIGH (ref 0.60–0.93)
GFR, Est African American: 51 mL/min/{1.73_m2} — ABNORMAL LOW (ref >=60)
GFR, Est Non African American: 44 mL/min/{1.73_m2} — ABNORMAL LOW (ref >=60)
GLOBULIN: 3 g/dL (ref 1.9–3.7)
Glucose, Bld: 101 mg/dL — ABNORMAL HIGH (ref 65–99)
POTASSIUM: 4.3 mmol/L (ref 3.5–5.3)
SODIUM: 141 mmol/L (ref 135–146)
Total Protein: 7.5 g/dL (ref 6.1–8.1)

## 2017-09-18 LAB — IRON,TIBC AND FERRITIN PANEL
%SAT: 10 % (calc) — ABNORMAL LOW (ref 16–45)
Ferritin: 39 ng/mL (ref 16–288)
IRON: 43 ug/dL — AB (ref 45–160)
TIBC: 425 mcg/dL (calc) (ref 250–450)

## 2017-09-18 LAB — HEMOGLOBIN A1C
Hgb A1c MFr Bld: 6.3 % of total Hgb — ABNORMAL HIGH (ref <5.7)
Mean Plasma Glucose: 134 (calc)
eAG (mmol/L): 7.4 (calc)

## 2017-09-18 LAB — CBC
HCT: 34 % — ABNORMAL LOW (ref 35.0–45.0)
HEMOGLOBIN: 11.1 g/dL — AB (ref 11.7–15.5)
MCH: 26.7 pg — AB (ref 27.0–33.0)
MCHC: 32.6 g/dL (ref 32.0–36.0)
MCV: 81.7 fL (ref 80.0–100.0)
MPV: 10 fL (ref 7.5–12.5)
Platelets: 456 10*3/uL — ABNORMAL HIGH (ref 140–400)
RBC: 4.16 10*6/uL (ref 3.80–5.10)
RDW: 15.3 % — ABNORMAL HIGH (ref 11.0–15.0)
WBC: 9.3 10*3/uL (ref 3.8–10.8)

## 2017-09-20 ENCOUNTER — Other Ambulatory Visit: Payer: Self-pay | Admitting: Osteopathic Medicine

## 2017-09-20 DIAGNOSIS — I10 Essential (primary) hypertension: Secondary | ICD-10-CM

## 2017-09-21 ENCOUNTER — Encounter: Payer: Self-pay | Admitting: Osteopathic Medicine

## 2017-10-10 ENCOUNTER — Other Ambulatory Visit: Payer: Self-pay | Admitting: Osteopathic Medicine

## 2017-10-10 DIAGNOSIS — F418 Other specified anxiety disorders: Secondary | ICD-10-CM

## 2017-10-10 NOTE — Telephone Encounter (Signed)
[facility] pharmacy requesting med RF for clonazepam.

## 2017-10-11 NOTE — Telephone Encounter (Signed)
Pt has been updated.  

## 2017-10-25 ENCOUNTER — Other Ambulatory Visit: Payer: Self-pay | Admitting: Osteopathic Medicine

## 2017-10-25 ENCOUNTER — Encounter: Payer: Self-pay | Admitting: Osteopathic Medicine

## 2017-10-25 DIAGNOSIS — F418 Other specified anxiety disorders: Secondary | ICD-10-CM

## 2017-11-23 ENCOUNTER — Other Ambulatory Visit: Payer: Self-pay | Admitting: Osteopathic Medicine

## 2017-11-23 DIAGNOSIS — F418 Other specified anxiety disorders: Secondary | ICD-10-CM

## 2017-11-23 DIAGNOSIS — M8580 Other specified disorders of bone density and structure, unspecified site: Secondary | ICD-10-CM

## 2017-11-23 NOTE — Telephone Encounter (Signed)
This pt seen at [facility] not [place] at [facility].  Rx request

## 2017-11-23 NOTE — Telephone Encounter (Signed)
Rx request  This pt seen at [facility] not [place] at [facility].

## 2017-11-23 NOTE — Telephone Encounter (Signed)
[facility] for clonazepam.

## 2017-11-23 NOTE — Telephone Encounter (Signed)
Left a brief vm for pt regarding med refill sent to local pharmacy. Call back info provided.

## 2017-11-28 ENCOUNTER — Ambulatory Visit (INDEPENDENT_AMBULATORY_CARE_PROVIDER_SITE_OTHER): Payer: Medicare Other | Admitting: Osteopathic Medicine

## 2017-11-28 ENCOUNTER — Encounter: Payer: Self-pay | Admitting: Osteopathic Medicine

## 2017-11-28 DIAGNOSIS — G5602 Carpal tunnel syndrome, left upper limb: Secondary | ICD-10-CM

## 2017-11-28 DIAGNOSIS — R7301 Impaired fasting glucose: Secondary | ICD-10-CM | POA: Diagnosis not present

## 2017-11-28 DIAGNOSIS — Z23 Encounter for immunization: Secondary | ICD-10-CM | POA: Diagnosis not present

## 2017-11-28 DIAGNOSIS — I10 Essential (primary) hypertension: Secondary | ICD-10-CM

## 2017-11-28 DIAGNOSIS — D509 Iron deficiency anemia, unspecified: Secondary | ICD-10-CM | POA: Diagnosis not present

## 2017-11-28 DIAGNOSIS — F418 Other specified anxiety disorders: Secondary | ICD-10-CM | POA: Diagnosis not present

## 2017-11-28 DIAGNOSIS — F5101 Primary insomnia: Secondary | ICD-10-CM | POA: Diagnosis not present

## 2017-11-28 DIAGNOSIS — M1991 Primary osteoarthritis, unspecified site: Secondary | ICD-10-CM | POA: Diagnosis not present

## 2017-11-28 MED ORDER — TEMAZEPAM 30 MG PO CAPS: 30.0000 mg | ORAL_CAPSULE | Freq: Every evening | ORAL | 2 refills | Status: DC | PRN

## 2017-11-28 MED ORDER — MELOXICAM 7.5 MG PO TABS: 7.5000 mg | ORAL_TABLET | Freq: Every day | ORAL | 0 refills | Status: DC

## 2017-11-28 NOTE — Progress Notes (Signed)
HPI: [name] is a 72 y.o. female who  has a past medical history of Anxiety, Depression, Headache, Hyperlipidemia, Hypertension, Osteopenia, and Primary localized osteoarthritis of right knee.  she presents to [facility] today, 11/28/17,  for chief complaint of:  Recheck insomnia, continue medications  See headings below  Insomnia: Currently controlled on medications as below, switching back and forth between Ambien and temazepam for some time now, seems to be working pretty well for her.  Depression/weight gain: Would like to come off of the Remeron.  She has been tapering down a bit on this already.  She is at half a tablet daily.  Noticed some weight gain on this.  About possibly coming off of the Effexor; when she decreases Remeron too much she has increased crying spells.  She has previously been on Zoloft and Prozac and did not find significant relief with this medicine either  Hand arthritis/carpal tunnel: Had been taking 200 mg of ibuprofen a few times a day, at this point has titrated up to 800 mg 3 times a day.  Would like to try different prescription anti-inflammatory if possible and requests referral for EMG prior to carpal tunnel surgery on the left hand.  Right hand has already been repaired in the left is starting to get bad.  Iron deficiency: Not tolerating oral iron tablets very well, has altered diet a bit to consume more iron rich foods and would like to recheck levels.  Other:  We were keeping an eye on mild anemia/iron levels and pre-DM range A1C.      Past medical history, surgical history, and family history reviewed.  Current medication list and allergy/intolerance information reviewed.   (See remainder of HPI, ROS, Phys Exam below)    ASSESSMENT/PLAN: Diagnoses of Needs flu shot, Microcytic anemia, Primary insomnia, Anxiety associated with depression, Essential hypertension, Elevated fasting glucose, Primary  osteoarthritis, unspecified site, and Carpal tunnel syndrome on left were pertinent to this visit.   Referral for EMG/carpal tunnel Hold ibuprofen, start Mobic Consider taper off Remeron Advised let me know if want to stop Effexor, will taper off  Consider starting augmentation (abilify, other) or switch    Orders Placed This Encounter  Procedures  . Flu vaccine HIGH DOSE PF (Fluzone High dose)  . CBC  . Hemoglobin A1c  . Fe+TIBC+Fer  . BASIC METABOLIC PANEL WITH GFR  . Nerve conduction test    Scheduling Instructions:     Electromyography, nerve conduction study left upper extremity for carpal tunnel syndrome    Order Specific Question:   Where should this test be performed?    Answer:   [facility]    Meds ordered this encounter  Medications  . temazepam (RESTORIL) 30 MG capsule    Sig: Take 1 capsule (30 mg total) by mouth at bedtime as needed for sleep.    Dispense:  30 capsule    Refill:  2  . meloxicam (MOBIC) 7.5 MG tablet    Sig: Take 1-2 tablets (7.5-15 mg total) by mouth daily. For pain    Dispense:  60 tablet    Refill:  0     Follow-up plan: Return in about 3 months (around 02/28/2018) for Maintain prescription medications.  Lab visit only in 6 weeks, recheck iron, sugars, kidneys.                ##################################################### ###################################################### ####################################################### ########################################################    Outpatient Encounter Medications as of 11/28/2017  Medication Sig  . amLODipine-benazepril (LOTREL)  5-20 MG capsule TAKE ONE CAPSULE BY MOUTH DAILY  . atorvastatin (LIPITOR) 20 MG tablet Take 1 tablet (20 mg total) by mouth daily.  . clonazePAM (KLONOPIN) 0.5 MG tablet TAKE 1 TABLET TWICE DAILY AS NEEDED FOR ANXIETY. DO NOT TAKE WITH TEMAZEPAM  . fluticasone (FLONASE) 50 MCG/ACT nasal spray Place into both nostrils daily.  . mirtazapine  (REMERON) 15 MG tablet TAKE ONE-HALF TO ONE TABLET BY MOUTH AT BEDTIME  . raloxifene (EVISTA) 60 MG tablet TAKE ONE TABLET BY MOUTH DAILY  . temazepam (RESTORIL) 30 MG capsule Take 1 capsule (30 mg total) by mouth at bedtime as needed for sleep.  Marland Kitchen triamterene-hydrochlorothiazide (DYAZIDE) 37.5-25 MG capsule TAKE ONE CAPSULE BY MOUTH DAILY  . venlafaxine XR (EFFEXOR-XR) 150 MG 24 hr capsule Take 1 capsule (150 mg total) by mouth daily with breakfast.  . zolpidem (AMBIEN) 10 MG tablet Take 1 tablet (10 mg total) by mouth at bedtime as needed for sleep.  . [DISCONTINUED] temazepam (RESTORIL) 30 MG capsule TAKE 1 CAPSULE BY MOUTH AT BEDTIME AS NEEDED FOR SLEEP. DO NOT TAKE WITH AMBIEN!!!  . meloxicam (MOBIC) 7.5 MG tablet Take 1-2 tablets (7.5-15 mg total) by mouth daily. For pain   No facility-administered encounter medications on file as of 11/28/2017.    Allergies  Allergen Reactions  . Penicillins Hives    Tolerates Cephalosporins   . Sulfa Antibiotics Hives           Review of Systems:  Constitutional: No recent illness, + weight gain   HEENT: No  headache  Cardiac: No  chest pain, No  pressure, No palpitations  Respiratory:  No  shortness of breath. No  Cough  Gastrointestinal: No  abdominal pain  Musculoskeletal: + myalgia/arthralgia  Skin: No  Rash  Neurologic: No  weakness, No  Dizziness  Psychiatric: No  concerns with depression, No  concerns with anxiety, sleeping problems are controlled  Exam:  BP (!) 156/55   Pulse 97   Temp 98.1 F (36.7 C) (Oral)   Wt 201 lb (91.2 kg)   BMI 36.76 kg/m   Constitutional: VS see above. General Appearance: alert, well-developed, well-nourished, NAD  Eyes: Normal lids and conjunctive, non-icteric sclera  Ears, Nose, Mouth, Throat: MMM, Normal external inspection ears/nares/mouth/lips/gums.  Neck: No masses, trachea midline.   Respiratory: Normal respiratory effort. no wheeze, no rhonchi, no rales  Cardiovascular:  S1/S2 normal, no murmur, no rub/gallop auscultated. RRR.   Musculoskeletal: Gait normal. Symmetric and independent movement of all extremities  Neurological: Normal balance/coordination. No tremor.  Skin: warm, dry, intact.   Psychiatric: Normal judgment/insight. Normal mood and affect. Oriented x3.   Visit summary with medication list and pertinent instructions was printed for patient to review, advised to alert [place] if any changes needed. All questions at time of visit were answered - patient instructed to contact office with any additional concerns. ER/RTC precautions were reviewed with the patient and understanding verbalized.   Follow-up plan: Return in about 3 months (around 02/28/2018) for Maintain prescription medications.  Lab visit only in 6 weeks, recheck iron, sugars, kidneys.  Note: Total time spent 25 minutes, greater than 50% of the visit was spent face-to-face counseling and coordinating care for the following: Diagnoses of Needs flu shot, Microcytic anemia, Primary insomnia, Anxiety associated with depression, Essential hypertension, Elevated fasting glucose, Primary osteoarthritis, unspecified site, and Carpal tunnel syndrome on left were pertinent to this visit.Marland Kitchen  Please note: voice recognition software was used to produce this document, and typos may  escape review. Please contact Dr. Sheppard Coil for any needed clarifications.

## 2017-11-29 LAB — BASIC METABOLIC PANEL WITH GFR
BUN/Creatinine Ratio: 18 (calc) (ref 6–22)
BUN: 26 mg/dL — AB (ref 7–25)
CALCIUM: 9.8 mg/dL (ref 8.6–10.4)
CO2: 23 mmol/L (ref 20–32)
CREATININE: 1.42 mg/dL — AB (ref 0.60–0.93)
Chloride: 101 mmol/L (ref 98–110)
GFR, EST AFRICAN AMERICAN: 42 mL/min/{1.73_m2} — AB (ref >=60)
GFR, EST NON AFRICAN AMERICAN: 37 mL/min/{1.73_m2} — AB (ref >=60)
Glucose, Bld: 112 mg/dL — ABNORMAL HIGH (ref 65–99)
POTASSIUM: 4 mmol/L (ref 3.5–5.3)
Sodium: 138 mmol/L (ref 135–146)

## 2017-11-29 LAB — HEMOGLOBIN A1C
HEMOGLOBIN A1C: 6.1 %{Hb} — AB (ref <5.7)
MEAN PLASMA GLUCOSE: 128 (calc)
eAG (mmol/L): 7.1 (calc)

## 2017-11-29 LAB — IRON,TIBC AND FERRITIN PANEL
%SAT: 11 % (calc) — ABNORMAL LOW (ref 16–45)
Ferritin: 35 ng/mL (ref 16–288)
Iron: 51 ug/dL (ref 45–160)
TIBC: 470 mcg/dL (calc) — ABNORMAL HIGH (ref 250–450)

## 2017-11-29 LAB — CBC
HCT: 35.4 % (ref 35.0–45.0)
Hemoglobin: 11.9 g/dL (ref 11.7–15.5)
MCH: 26.8 pg — ABNORMAL LOW (ref 27.0–33.0)
MCHC: 33.6 g/dL (ref 32.0–36.0)
MCV: 79.7 fL — ABNORMAL LOW (ref 80.0–100.0)
MPV: 10 fL (ref 7.5–12.5)
PLATELETS: 468 10*3/uL — AB (ref 140–400)
RBC: 4.44 10*6/uL (ref 3.80–5.10)
RDW: 15.2 % — ABNORMAL HIGH (ref 11.0–15.0)
WBC: 7.9 10*3/uL (ref 3.8–10.8)

## 2017-12-19 ENCOUNTER — Other Ambulatory Visit: Payer: Self-pay | Admitting: Osteopathic Medicine

## 2017-12-19 DIAGNOSIS — I10 Essential (primary) hypertension: Secondary | ICD-10-CM

## 2017-12-20 ENCOUNTER — Other Ambulatory Visit: Payer: Self-pay | Admitting: Osteopathic Medicine

## 2017-12-20 DIAGNOSIS — F418 Other specified anxiety disorders: Secondary | ICD-10-CM

## 2017-12-22 ENCOUNTER — Other Ambulatory Visit: Payer: Self-pay | Admitting: Osteopathic Medicine

## 2017-12-22 DIAGNOSIS — F418 Other specified anxiety disorders: Secondary | ICD-10-CM

## 2017-12-24 NOTE — Telephone Encounter (Signed)
Ok will disregard refill request then

## 2017-12-24 NOTE — Telephone Encounter (Signed)
Yes, pharmacy did get Rx from 12/20/17 and Pt picked up on 12/22/17.

## 2017-12-24 NOTE — Telephone Encounter (Signed)
Refill already sent last week 12/20/17 to [facility] - can we confirm the pharmacy received this?

## 2018-01-02 ENCOUNTER — Other Ambulatory Visit: Payer: Self-pay

## 2018-01-08 ENCOUNTER — Ambulatory Visit: Payer: Self-pay

## 2018-01-15 ENCOUNTER — Other Ambulatory Visit: Payer: Self-pay | Admitting: Osteopathic Medicine

## 2018-01-18 ENCOUNTER — Other Ambulatory Visit: Payer: Self-pay

## 2018-01-18 DIAGNOSIS — F418 Other specified anxiety disorders: Secondary | ICD-10-CM

## 2018-01-18 MED ORDER — CLONAZEPAM 0.5 MG PO TABS: ORAL_TABLET | ORAL | 0 refills | Status: DC

## 2018-01-18 NOTE — Telephone Encounter (Signed)
Pt advised RX sent.

## 2018-01-18 NOTE — Telephone Encounter (Signed)
Requesting RF on Clonazepam  Last RX sent 12/20/17 for #60 no RF  RX pended, please send if appropriate

## 2018-01-24 ENCOUNTER — Other Ambulatory Visit: Payer: Self-pay | Admitting: Osteopathic Medicine

## 2018-01-24 DIAGNOSIS — F321 Major depressive disorder, single episode, moderate: Secondary | ICD-10-CM

## 2018-02-12 NOTE — Progress Notes (Signed)
Subjective:   [name] is a 72 y.o. female who presents for an Initial Medicare Annual Wellness Visit.  Review of Systems    No ROS.  Medicare Wellness Visit. Additional risk factors are reflected in the social history.   Cardiac Risk Factors include: advanced age (>1men, >53 women);dyslipidemia;hypertension;sedentary lifestyle Sleep patterns: Getting 6-7 hours of sleep at night. Wakes up 1-2 times a night to go to the bathroom. Wakes up feeling relaxed and ready to go.   Home Safety/Smoke Alarms: Feels safe in home. Smoke alarms in place.  Living environment; Lives with husband in a 1 story home steps have a handrail in place. Shower is a walk in shower and grab bars are in place. Seat Belt Safety/Bike Helmet: Wears seat belt.   Female:   Pap- aged out unless necessary      Mammo-   utd    Dexa scan-  utd      CCS- utd       Objective:    There were no vitals filed for this visit. There is no height or weight on file to calculate BMI.  Advanced Directives 02/18/2018 09/20/2015 09/10/2015 08/04/2015  Does Patient Have a Medical Advance Directive? Yes Yes Yes Yes  Type of Paramedic of [place];Out of facility DNR (pink MOST or yellow form);Living will [place];Living will [place];Living will Out of facility DNR (pink MOST or yellow form);Living will;Healthcare Power of Attorney  Does patient want to make changes to medical advance directive? No - Patient declined - - No - Patient declined  Copy of [place] in Chart? No - copy requested Yes No - copy requested No - copy requested    Current Medications (verified) Outpatient Encounter Medications as of 02/18/2018  Medication Sig  . amLODipine-benazepril (LOTREL) 5-20 MG capsule TAKE ONE CAPSULE BY MOUTH DAILY  . atorvastatin (LIPITOR) 20 MG tablet Take 1 tablet (20 mg total) by mouth daily.  . clonazePAM (KLONOPIN) 0.5 MG tablet TAKE 1 TABLET  BY MOUTH TWICE DAILY AS NEEDED FOR ANXIETY. DO NOT TAKE WITH TEMAZEPAM.  . fluticasone (FLONASE) 50 MCG/ACT nasal spray Place into both nostrils daily.  . meloxicam (MOBIC) 7.5 MG tablet TAKE ONE TO TWO TABLET BY MOUTH DAILY FOR PAIN  . mirtazapine (REMERON) 15 MG tablet TAKE ONE-HALF TO ONE TABLET BY MOUTH AT BEDTIME  . raloxifene (EVISTA) 60 MG tablet TAKE ONE TABLET BY MOUTH DAILY  . temazepam (RESTORIL) 30 MG capsule Take 1 capsule (30 mg total) by mouth at bedtime as needed for sleep.  Marland Kitchen triamterene-hydrochlorothiazide (DYAZIDE) 37.5-25 MG capsule TAKE ONE CAPSULE BY MOUTH DAILY  . triamterene-hydrochlorothiazide (DYAZIDE) 37.5-25 MG capsule TAKE ONE CAPSULE BY MOUTH DAILY  . venlafaxine XR (EFFEXOR-XR) 150 MG 24 hr capsule TAKE ONE CAPSULE BY MOUTH EVERY MORNING WITH BREAKFAST  . zolpidem (AMBIEN) 10 MG tablet Take 1 tablet (10 mg total) by mouth at bedtime as needed for sleep.  . [DISCONTINUED] clonazePAM (KLONOPIN) 0.5 MG tablet TAKE 1 TABLET BY MOUTH TWICE DAILY AS NEEDED FOR ANXIETY. DO NOT TAKE WITH TEMAZEPAM  . [DISCONTINUED] meloxicam (MOBIC) 7.5 MG tablet TAKE ONE OR TWO TABLETS BY MOUTH DAILY FOR PAIN  . [DISCONTINUED] venlafaxine XR (EFFEXOR-XR) 150 MG 24 hr capsule Take 1 capsule (150 mg total) by mouth daily with breakfast.  . [DISCONTINUED] zolpidem (AMBIEN) 10 MG tablet Take 1 tablet (10 mg total) by mouth at bedtime as needed for sleep.   No facility-administered  encounter medications on file as of 02/18/2018.     Allergies (verified) Penicillins and Sulfa antibiotics   History: Past Medical History:  Diagnosis Date  . Anxiety   . Depression   . Headache    SINUS  . Hyperlipidemia   . Hypertension   . Osteopenia   . Primary localized osteoarthritis of right knee    Past Surgical History:  Procedure Laterality Date  . BREAST LUMPECTOMY Right 1997   Done in [place]  . CARPAL TUNNEL RELEASE Right 2015   Ortho in [place]  . LASIK    . MENISCUS REPAIR Right  2011   Orthopedic in [place]  . TOTAL KNEE ARTHROPLASTY Right 09/20/2015  . TOTAL KNEE ARTHROPLASTY Right 09/20/2015   Procedure: TOTAL KNEE ARTHROPLASTY;  Surgeon: Elsie Saas, MD;  Location: [facility];  Service: Orthopedics;  Laterality: Right;  . Trigger Thumb Right 2015   Done in [facility] at the same time as the carpal tunnel release  . TUBAL LIGATION  1980   Family History  Problem Relation Age of Onset  . Heart failure Mother   . Heart attack Mother   . Alcohol abuse Maternal Aunt   . Heart attack Father   . Irregular heart beat Sister   . Hypertension Sister   . Heart disease Brother    Social History   Socioeconomic History  . Marital status: Married    Spouse name: Purcell Nails  . Number of children: 1  . Years of education: 17.5  . Highest education level: Master's degree (e.g., MA, MS, MEng, MEd, MSW, MBA)  Occupational History  . Occupation: retired    Comment: Automotive engineer Needs  . Financial resource strain: Not hard at all  . Food insecurity:    Worry: Never true    Inability: Never true  . Transportation needs:    Medical: No    Non-medical: No  Tobacco Use  . Smoking status: Never Smoker  . Smokeless tobacco: Never Used  Substance and Sexual Activity  . Alcohol use: No    Comment: 1 glass a month  . Drug use: No  . Sexual activity: Yes  Lifestyle  . Physical activity:    Days per week: 3 days    Minutes per session: 30 min  . Stress: Not at all  Relationships  . Social connections:    Talks on phone: More than three times a week    Gets together: Twice a week    Attends religious service: Never    Active member of club or organization: Yes    Attends meetings of clubs or organizations: More than 4 times per year    Relationship status: Married  Other Topics Concern  . Not on file  Social History Narrative   Reads paper, walks, housework errands. Caffeine use daily    Tobacco Counseling Counseling given: Not Answered   Clinical  Intake:  Pre-visit preparation completed: Yes  Pain : No/denies pain     Nutritional Risks: None Diabetes: No  How often do you need to have someone help you when you read instructions, pamphlets, or other written materials from your doctor or pharmacy?: 1 - Never What is the last grade level you completed in school?: 17.5  Interpreter Needed?: No  Information entered by :: Orlie Dakin, LPN   Activities of Daily Living In your present state of health, do you have any difficulty performing the following activities: 02/18/2018  Hearing? N  Comment wearing hearing aids daily  Vision? N  Difficulty concentrating or making decisions? N  Walking or climbing stairs? N  Dressing or bathing? N  Doing errands, shopping? N  Preparing Food and eating ? N  Using the Toilet? N  In the past six months, have you accidently leaked urine? N  Do you have problems with loss of bowel control? N  Managing your Medications? N  Managing your Finances? N  Housekeeping or managing your Housekeeping? N  Some recent data might be hidden     Immunizations and Health Maintenance Immunization History  Administered Date(s) Administered  . Influenza, High Dose Seasonal PF 12/14/2016, 11/28/2017  . Influenza,inj,Quad PF,6+ Mos 12/07/2015  . Pneumococcal Conjugate-13 07/01/2014  . Pneumococcal Polysaccharide-23 07/07/2010  . Tdap 08/18/2008, 12/05/2011  . Zoster 08/22/2010   Health Maintenance Due  Topic Date Due  . Hepatitis C Screening  01/27/1946    Patient Care Team: Emeterio Reeve, DO as PCP - General (Osteopathic Medicine)  Indicate any recent Medical Services you may have received from other than [facility] providers in the past year (date may be approximate).     Assessment:   This is a routine wellness examination for [name].Physical assessment deferred to PCP.   Hearing/Vision screen  Visual Acuity Screening   Right eye Left eye Both eyes  Without correction: 20/40 20/25 20/25    With correction:     Hearing Screening Comments: Whisper test done and patient reported all 3 words back correctly.  Dietary issues and exercise activities discussed: Current Exercise Habits: Home exercise routine, Type of exercise: walking, Time (Minutes): 30, Frequency (Times/Week): 3, Weekly Exercise (Minutes/Week): 90, Intensity: Mild, Exercise limited by: cardiac condition(s) Diet  Eating a healthy diet for the most part. Does snack. Breakfast: Bagel and fruit Lunch: 1/2 sandwich, fruit and kettle chipsDinner:     Dinner: meat, 2 vegetables, rice or potato. Try to watch your salt intake  Goals    . Weight (lb) < 200 lb (90.7 kg)     Wants to loose around 20-25 lbs. Is a member of weight watchers      Depression Screen PHQ 2/9 Scores 02/18/2018 09/17/2017 12/28/2016 02/15/2016 01/18/2016 12/07/2015  PHQ - 2 Score 0 1 2 3 3  0  PHQ- 9 Score - 4 5 12 16  -  Some encounter information is confidential and restricted. Go to Review Flowsheets activity to see all data.    Fall Risk Fall Risk  02/18/2018 01/02/2018 01/18/2017 12/28/2016 12/07/2015  Falls in the past year? 0 0 No No No  Comment - Emmi Telephone Survey: data to providers prior to load Emmi Telephone Survey: data to providers prior to load - -  Follow up Falls evaluation completed - - - -    Is the patient's home free of loose throw rugs in walkways, pet beds, electrical cords, etc?   yes      Grab bars in the bathroom? yes      Handrails on the stairs?   yes      Adequate lighting?   yes   Cognitive Function:     6CIT Screen 02/18/2018  What Year? 0 points  What month? 0 points  What time? 0 points  Count back from 20 2 points  Months in reverse 0 points  Repeat phrase 0 points  Total Score 2    Screening Tests Health Maintenance  Topic Date Due  . Hepatitis C Screening  May 03, 1945  . MAMMOGRAM  06/14/2019  . Fecal DNA (Cologuard)  07/10/2019  . TETANUS/TDAP  12/04/2021  .  INFLUENZA VACCINE  Completed  . DEXA  SCAN  Completed  . PNA vac Low Risk Adult  Completed      Plan:      [name] , Thank you for taking time to come for your Medicare Wellness Visit. I appreciate your ongoing commitment to your health goals. Please review the following plan we discussed and let me know if I can assist you in the future.  Please schedule your next medicare wellness visit with me in 1 yr. Continue doing brain stimulating activities (puzzles, reading, adult coloring books, staying active) to keep memory sharp.  Bring a copy of your living will and/or healthcare power of attorney to your next office visit.   These are the goals we discussed: Goals    . Weight (lb) < 200 lb (90.7 kg)     Wants to loose around 20-25 lbs. Is a member of weight watchers       This is a list of the screening recommended for you and due dates:  Health Maintenance  Topic Date Due  .  Hepatitis C: One time screening is recommended by Center for Disease Control  (CDC) for  adults born from [birth year] through 1966.   1946-02-07  . Mammogram  06/14/2019  . Cologuard (Stool DNA test)  07/10/2019  . Tetanus Vaccine  12/04/2021  . Flu Shot  Completed  . DEXA scan (bone density measurement)  Completed  . Pneumonia vaccines  Completed     I have personally reviewed and noted the following in the patient's chart:   . Medical and social history . Use of alcohol, tobacco or illicit drugs  . Current medications and supplements . Functional ability and status . Nutritional status . Physical activity . Advanced directives . List of other physicians . Hospitalizations, surgeries, and ER visits in previous 12 months . Vitals . Screenings to include cognitive, depression, and falls . Referrals and appointments  In addition, I have reviewed and discussed with patient certain preventive protocols, quality metrics, and best practice recommendations. A written personalized care plan for preventive services as well as general preventive  health recommendations were provided to patient.     Joanne Chars, LPN   08/18/1605

## 2018-02-13 ENCOUNTER — Other Ambulatory Visit: Payer: Self-pay | Admitting: Osteopathic Medicine

## 2018-02-13 ENCOUNTER — Encounter: Payer: Self-pay | Admitting: Osteopathic Medicine

## 2018-02-13 DIAGNOSIS — F418 Other specified anxiety disorders: Secondary | ICD-10-CM

## 2018-02-14 MED ORDER — ZOLPIDEM TARTRATE 10 MG PO TABS: 10.0000 mg | ORAL_TABLET | Freq: Every evening | ORAL | 2 refills | Status: DC | PRN

## 2018-02-14 NOTE — Telephone Encounter (Signed)
[facility] store requesting med refill for clonazepam. Pt has an upcoming appt on 02/18/18 for f/u w/provider.

## 2018-02-14 NOTE — Telephone Encounter (Signed)
Pt has been updated. No other inquiries during call.  

## 2018-02-15 ENCOUNTER — Telehealth: Payer: Self-pay | Admitting: Family Medicine

## 2018-02-15 NOTE — Telephone Encounter (Signed)
Talked with Tahjanae's husband Fritz Pickerel about MMR titers. He is going to get the titer.

## 2018-02-18 ENCOUNTER — Ambulatory Visit (INDEPENDENT_AMBULATORY_CARE_PROVIDER_SITE_OTHER): Payer: Medicare Other | Admitting: *Deleted

## 2018-02-18 VITALS — BP 128/60 | HR 76 | Ht 62.0 in | Wt 200.0 lb

## 2018-02-18 DIAGNOSIS — Z0000 Encounter for general adult medical examination without abnormal findings: Secondary | ICD-10-CM

## 2018-02-18 NOTE — Patient Instructions (Signed)
Ms. Rode , Thank you for taking time to come for your Medicare Wellness Visit. I appreciate your ongoing commitment to your health goals. Please review the following plan we discussed and let me know if I can assist you in the future.  Please schedule your next medicare wellness visit with me in 1 yr. Continue doing brain stimulating activities (puzzles, reading, adult coloring books, staying active) to keep memory sharp.  Bring a copy of your living will and/or healthcare power of attorney to your next office visit. These are the goals we discussed: Goals    . Weight (lb) < 200 lb (90.7 kg)     Wants to loose around 20-25 lbs. Is a member of weight watchers

## 2018-02-19 ENCOUNTER — Other Ambulatory Visit: Payer: Self-pay | Admitting: Osteopathic Medicine

## 2018-02-25 DIAGNOSIS — H25013 Cortical age-related cataract, bilateral: Secondary | ICD-10-CM | POA: Diagnosis not present

## 2018-02-25 DIAGNOSIS — H35033 Hypertensive retinopathy, bilateral: Secondary | ICD-10-CM | POA: Diagnosis not present

## 2018-02-25 DIAGNOSIS — H2513 Age-related nuclear cataract, bilateral: Secondary | ICD-10-CM | POA: Diagnosis not present

## 2018-02-25 DIAGNOSIS — H43811 Vitreous degeneration, right eye: Secondary | ICD-10-CM | POA: Diagnosis not present

## 2018-03-07 ENCOUNTER — Encounter: Payer: Self-pay | Admitting: Osteopathic Medicine

## 2018-03-07 MED ORDER — ESZOPICLONE 2 MG PO TABS: 1.0000 mg | ORAL_TABLET | Freq: Every evening | ORAL | 0 refills | Status: DC | PRN

## 2018-03-15 ENCOUNTER — Other Ambulatory Visit: Payer: Self-pay | Admitting: Osteopathic Medicine

## 2018-03-15 DIAGNOSIS — F418 Other specified anxiety disorders: Secondary | ICD-10-CM

## 2018-03-15 NOTE — Telephone Encounter (Signed)
Last RF 02/14/18  Last OV 11/28/2017  No upcoming appt scheduled  RX pended with note stating pt need appt for further refills.. please review and send if appropriate

## 2018-03-26 ENCOUNTER — Other Ambulatory Visit: Payer: Self-pay | Admitting: Osteopathic Medicine

## 2018-04-01 ENCOUNTER — Encounter: Payer: Self-pay | Admitting: Osteopathic Medicine

## 2018-04-01 DIAGNOSIS — G5600 Carpal tunnel syndrome, unspecified upper limb: Secondary | ICD-10-CM

## 2018-04-04 ENCOUNTER — Other Ambulatory Visit: Payer: Self-pay | Admitting: Osteopathic Medicine

## 2018-04-04 NOTE — Telephone Encounter (Signed)
[facility] requesting med refill for [facility].

## 2018-04-05 NOTE — Telephone Encounter (Signed)
Pt has been updated that med refill sent to local pharmacy. No other inquiries during call.  

## 2018-04-09 ENCOUNTER — Encounter (INDEPENDENT_AMBULATORY_CARE_PROVIDER_SITE_OTHER): Payer: Self-pay | Admitting: Orthopaedic Surgery

## 2018-04-09 ENCOUNTER — Ambulatory Visit (INDEPENDENT_AMBULATORY_CARE_PROVIDER_SITE_OTHER): Payer: Medicare Other | Admitting: Orthopaedic Surgery

## 2018-04-09 VITALS — Ht 62.0 in | Wt 200.0 lb

## 2018-04-09 DIAGNOSIS — G5602 Carpal tunnel syndrome, left upper limb: Secondary | ICD-10-CM | POA: Diagnosis not present

## 2018-04-09 DIAGNOSIS — M65321 Trigger finger, right index finger: Secondary | ICD-10-CM | POA: Diagnosis not present

## 2018-04-09 MED ORDER — METHYLPREDNISOLONE ACETATE 40 MG/ML IJ SUSP
13.3300 mg | INTRAMUSCULAR | Status: AC | PRN
  Administered 2018-04-09: 13.33 mg

## 2018-04-09 MED ORDER — LIDOCAINE HCL 1 % IJ SOLN
0.3000 mL | INTRAMUSCULAR | Status: AC | PRN
  Administered 2018-04-09: .3 mL

## 2018-04-09 MED ORDER — BUPIVACAINE HCL 0.5 % IJ SOLN
0.3300 mL | INTRAMUSCULAR | Status: AC | PRN
  Administered 2018-04-09: .33 mL

## 2018-04-09 NOTE — Progress Notes (Signed)
Office Visit Note   Patient: [name]           Date of Birth: 07-28-1945           MRN: [id number] Visit Date: 04/09/2018              Requested by: Emeterio Reeve, [address] [place], [place] [id number] PCP: Emeterio Reeve, DO   Assessment & Plan: Visit Diagnoses:  1. Trigger finger, right index finger   2. Left carpal tunnel syndrome     Plan: Impression is right index trigger finger and suspected left carpal tunnel syndrome.  We injected the right index trigger finger today which patient tolerated well.  We will obtain nerve conduction studies to evaluate for the left carpal tunnel syndrome.  We will see her back to review the results of this nerve conduction study and to check the response to the injection.  Follow-Up Instructions: Return if symptoms worsen or fail to improve.   Orders:  Orders Placed This Encounter  Procedures  . Ambulatory referral to Physical Medicine Rehab   No orders of the defined types were placed in this encounter.     Procedures: Hand/UE Inj: R index A1 for trigger finger on 04/09/2018 11:14 AM Indications: pain Details: 25 G needle Medications: 0.3 mL lidocaine 1 %; 0.33 mL bupivacaine 0.5 %; 13.33 mg methylPREDNISolone acetate 40 MG/ML Outcome: tolerated well, no immediate complications Consent was given by the patient. Patient was prepped and draped in the usual sterile fashion.       Clinical Data: No additional findings.   Subjective: Chief Complaint  Patient presents with  . Right Hand - Pain    Index finger triggering   . Left Wrist - Pain    [name] is a 73 year old female comes in with 2 separate issues.  The first issue is her right index trigger finger.  She previously had a right trigger thumb which was treated surgically and did well from this.  She states that the index finger is triggering on a daily basis that is worse in the morning and with use of the right hand.  She denies history  of rheumatoid arthritis.  Her second issue is left carpal tunnel syndrome.  She had a right carpal tunnel release about 5 years ago and did well but now she is experiencing some symptoms reminiscent of this in her left hand.  She endorses constant pins-and-needles with prolonged use of the left hand.  She does not endorse any significant pain.   Review of Systems  Constitutional: Negative.   HENT: Negative.   Eyes: Negative.   Respiratory: Negative.   Cardiovascular: Negative.   Endocrine: Negative.   Musculoskeletal: Negative.   Neurological: Negative.   Hematological: Negative.   Psychiatric/Behavioral: Negative.   All other systems reviewed and are negative.    Objective: Vital Signs: Ht 5\' 2"  (1.575 m)   Wt 200 lb (90.7 kg)   BMI 36.58 kg/m   Physical Exam Vitals signs and nursing note reviewed.  Constitutional:      Appearance: She is well-developed.  HENT:     Head: Normocephalic and atraumatic.  Neck:     Musculoskeletal: Neck supple.  Pulmonary:     Effort: Pulmonary effort is normal.  Abdominal:     Palpations: Abdomen is soft.  Skin:    General: Skin is warm.     Capillary Refill: Capillary refill takes less than 2 seconds.  Neurological:     Mental  Status: She is alert and oriented to person, place, and time.  Psychiatric:        Behavior: Behavior normal.        Thought Content: Thought content normal.        Judgment: Judgment normal.     Ortho Exam Right hand exam shows a triggering right index finger.  No soft tissue crepitus. Left hand exam shows negative carpal tunnel compressive signs.  No atrophy of thenar muscles. Specialty Comments:  No specialty comments available.  Imaging: No results found.   PMFS History: Patient Active Problem List   Diagnosis Date Noted  . Post-menopausal 05/14/2017  . Prediabetes 01/12/2017  . Snoring 07/20/2016  . Obsessive-compulsive personality trait 12/07/2015  . Morbid obesity ([place]) 09/19/2015  .  Primary localized osteoarthritis of right knee   . Cardiac risk counseling 08/16/2015  . Essential hypertension 08/04/2015  . Hyperlipidemia 08/04/2015  . Osteopenia 08/04/2015  . Anxiety associated with depression 08/04/2015  . Right knee pain 06/14/2015  . Hyposomnia, insomnia or sleeplessness associated with anxiety 06/14/2015   Past Medical History:  Diagnosis Date  . Anxiety   . Depression   . Headache    SINUS  . Hyperlipidemia   . Hypertension   . Osteopenia   . Primary localized osteoarthritis of right knee     Family History  Problem Relation Age of Onset  . Heart failure Mother   . Heart attack Mother   . Alcohol abuse Maternal Aunt   . Heart attack Father   . Irregular heart beat Sister   . Hypertension Sister   . Heart disease Brother     Past Surgical History:  Procedure Laterality Date  . BREAST LUMPECTOMY Right 1997   Done in [place]  . CARPAL TUNNEL RELEASE Right 2015   Ortho in [place]  . LASIK    . MENISCUS REPAIR Right 2011   Orthopedic in [place]  . TOTAL KNEE ARTHROPLASTY Right 09/20/2015  . TOTAL KNEE ARTHROPLASTY Right 09/20/2015   Procedure: TOTAL KNEE ARTHROPLASTY;  Surgeon: Elsie Saas, MD;  Location: [facility];  Service: Orthopedics;  Laterality: Right;  . Trigger Thumb Right 2015   Done in [facility] at the same time as the carpal tunnel release  . TUBAL LIGATION  1980   Social History   Occupational History  . Occupation: retired    Comment: Licensed conveyancer  Tobacco Use  . Smoking status: Never Smoker  . Smokeless tobacco: Never Used  Substance and Sexual Activity  . Alcohol use: No    Comment: 1 glass a month  . Drug use: No  . Sexual activity: Yes

## 2018-04-12 ENCOUNTER — Other Ambulatory Visit: Payer: Self-pay | Admitting: Osteopathic Medicine

## 2018-04-12 DIAGNOSIS — F418 Other specified anxiety disorders: Secondary | ICD-10-CM

## 2018-04-12 NOTE — Telephone Encounter (Signed)
[facility] store requesting med refill for clonazepam.

## 2018-04-15 NOTE — Telephone Encounter (Signed)
Left a detailed vm msg for pt regarding med refill sent to local pharmacy on file. Call back info provided.

## 2018-04-25 ENCOUNTER — Encounter (INDEPENDENT_AMBULATORY_CARE_PROVIDER_SITE_OTHER): Payer: Medicare Other | Admitting: Physical Medicine and Rehabilitation

## 2018-04-25 ENCOUNTER — Encounter (INDEPENDENT_AMBULATORY_CARE_PROVIDER_SITE_OTHER): Payer: Self-pay

## 2018-04-28 ENCOUNTER — Other Ambulatory Visit: Payer: Self-pay | Admitting: Osteopathic Medicine

## 2018-04-29 NOTE — Telephone Encounter (Signed)
Pt has been updated of med refills for multiple meds. No other inquiries during call.

## 2018-04-29 NOTE — Telephone Encounter (Signed)
[name] requesting med refills for lunesta and meloxicam.

## 2018-04-30 ENCOUNTER — Ambulatory Visit (INDEPENDENT_AMBULATORY_CARE_PROVIDER_SITE_OTHER): Payer: Self-pay | Admitting: Orthopaedic Surgery

## 2018-05-12 ENCOUNTER — Other Ambulatory Visit: Payer: Self-pay | Admitting: Osteopathic Medicine

## 2018-05-12 ENCOUNTER — Encounter: Payer: Self-pay | Admitting: Osteopathic Medicine

## 2018-05-12 DIAGNOSIS — F321 Major depressive disorder, single episode, moderate: Secondary | ICD-10-CM

## 2018-05-12 DIAGNOSIS — F418 Other specified anxiety disorders: Secondary | ICD-10-CM

## 2018-05-17 ENCOUNTER — Other Ambulatory Visit: Payer: Self-pay | Admitting: Osteopathic Medicine

## 2018-05-17 DIAGNOSIS — M8580 Other specified disorders of bone density and structure, unspecified site: Secondary | ICD-10-CM

## 2018-06-02 ENCOUNTER — Other Ambulatory Visit: Payer: Self-pay | Admitting: Osteopathic Medicine

## 2018-06-02 ENCOUNTER — Encounter: Payer: Self-pay | Admitting: Osteopathic Medicine

## 2018-06-04 ENCOUNTER — Other Ambulatory Visit: Payer: Self-pay | Admitting: Osteopathic Medicine

## 2018-06-04 DIAGNOSIS — F5101 Primary insomnia: Secondary | ICD-10-CM

## 2018-06-04 MED ORDER — TEMAZEPAM 30 MG PO CAPS: 30.0000 mg | ORAL_CAPSULE | Freq: Every evening | ORAL | 2 refills | Status: DC | PRN

## 2018-06-10 ENCOUNTER — Other Ambulatory Visit: Payer: Self-pay | Admitting: Osteopathic Medicine

## 2018-06-10 DIAGNOSIS — F418 Other specified anxiety disorders: Secondary | ICD-10-CM

## 2018-06-10 NOTE — Telephone Encounter (Signed)
Please advise 

## 2018-06-17 ENCOUNTER — Other Ambulatory Visit: Payer: Self-pay | Admitting: Osteopathic Medicine

## 2018-06-17 DIAGNOSIS — I10 Essential (primary) hypertension: Secondary | ICD-10-CM

## 2018-07-01 ENCOUNTER — Other Ambulatory Visit: Payer: Self-pay | Admitting: Osteopathic Medicine

## 2018-07-01 DIAGNOSIS — M8580 Other specified disorders of bone density and structure, unspecified site: Secondary | ICD-10-CM

## 2018-07-01 NOTE — Telephone Encounter (Signed)
Please review for refill- patient at [facility] 

## 2018-07-08 ENCOUNTER — Other Ambulatory Visit: Payer: Self-pay | Admitting: Osteopathic Medicine

## 2018-07-08 DIAGNOSIS — F418 Other specified anxiety disorders: Secondary | ICD-10-CM

## 2018-07-08 DIAGNOSIS — F321 Major depressive disorder, single episode, moderate: Secondary | ICD-10-CM

## 2018-07-08 NOTE — Telephone Encounter (Signed)
Is this okay to refill? 

## 2018-07-09 ENCOUNTER — Other Ambulatory Visit: Payer: Self-pay | Admitting: Osteopathic Medicine

## 2018-07-09 DIAGNOSIS — F321 Major depressive disorder, single episode, moderate: Secondary | ICD-10-CM

## 2018-07-09 NOTE — Telephone Encounter (Signed)
Last RX Clonazepam sent 06/11/18 for #[id number]  RX pended, please advise

## 2018-07-17 ENCOUNTER — Ambulatory Visit (INDEPENDENT_AMBULATORY_CARE_PROVIDER_SITE_OTHER): Payer: Medicare Other | Admitting: Osteopathic Medicine

## 2018-07-17 ENCOUNTER — Encounter: Payer: Self-pay | Admitting: Osteopathic Medicine

## 2018-07-17 VITALS — BP 148/80 | HR 84 | Temp 98.4°F | Wt 206.2 lb

## 2018-07-17 DIAGNOSIS — R7301 Impaired fasting glucose: Secondary | ICD-10-CM | POA: Diagnosis not present

## 2018-07-17 DIAGNOSIS — F418 Other specified anxiety disorders: Secondary | ICD-10-CM

## 2018-07-17 DIAGNOSIS — R7303 Prediabetes: Secondary | ICD-10-CM

## 2018-07-17 DIAGNOSIS — F5101 Primary insomnia: Secondary | ICD-10-CM | POA: Diagnosis not present

## 2018-07-17 DIAGNOSIS — I10 Essential (primary) hypertension: Secondary | ICD-10-CM

## 2018-07-17 LAB — POCT GLYCOSYLATED HEMOGLOBIN (HGB A1C): Hemoglobin A1C: 5.9 % — AB (ref 4.0–5.6)

## 2018-07-17 NOTE — Progress Notes (Signed)
HPI: [name] is a 73 y.o. female who  has a past medical history of Anxiety, Depression, Headache, Hyperlipidemia, Hypertension, Osteopenia, and Primary localized osteoarthritis of right knee.  she presents to [facility] today, 07/17/18,  for chief complaint of:  Medications - insomnia   PDMP reviewed: Clonazepam: consistently every month or so. 6 fills in 2020 Alternating between Ambien, Lunesta, Temazepam qhs for sleep   Has tried to come off Remeron but having issues with this, would really like to stop it however given concerns about weight gain.    At today's visit 07/17/18 ... PMH, PSH, FH reviewed and updated as needed.  Current medication list and allergy/intolerance hx reviewed and updated as needed. (See remainder of HPI, ROS, Phys Exam below)   No results found.  Results for orders placed or performed in visit on 07/17/18 (from the past 72 hour(s))  POCT HgB A1C     Status: Abnormal   Collection Time: 07/17/18 11:13 AM  Result Value Ref Range   Hemoglobin A1C 5.9 (A) 4.0 - 5.6 %   HbA1c POC (<> result, manual entry)     HbA1c, POC (prediabetic range)     HbA1c, POC (controlled diabetic range)            ASSESSMENT/PLAN: The primary encounter diagnosis was Primary insomnia. Diagnoses of Elevated fasting glucose, Anxiety associated with depression, Prediabetes, and Essential hypertension were also pertinent to this visit.   Orders Placed This Encounter  Procedures  . POCT HgB A1C     No orders of the defined types were placed in this encounter.   Patient Instructions  Half dose Remeron for a month Then try stopping this   Can see how you do coming off this, can consider Ozempic Rx for weight loss/ sugars.         Follow-up plan: Return in about 6 months (around 01/16/2019) for annual / labs w/ Dr Sheppard Coil. Sooner if needed.  .                                                 ################################################# ################################################# ################################################# #################################################    No outpatient medications have been marked as taking for the 07/17/18 encounter (Office Visit) with Emeterio Reeve, DO.    Allergies  Allergen Reactions  . Penicillins Hives    Tolerates Cephalosporins   . Sulfa Antibiotics Hives            Review of Systems:  Constitutional: No recent illness, +fatigue, +weight gain   HEENT: No  headache, no vision change  Cardiac: No  chest pain, No  pressure, No palpitations  Respiratory:  No  shortness of breath. No  Cough  Gastrointestinal: No  abdominal pain,  Musculoskeletal: No new myalgia/arthralgia  Psychiatric: +concerns with depression, No  concerns with anxiety  Exam:  BP (!) 148/80 (BP Location: Left Arm, Patient Position: Sitting, Cuff Size: Large)   Pulse 84   Temp 98.4 F (36.9 C) (Oral)   Wt 206 lb 3.2 oz (93.5 kg)   BMI 37.71 kg/m   Constitutional: VS see above. General Appearance: alert, well-developed, well-nourished, NAD  Eyes: Normal lids and conjunctive, non-icteric sclera  Ears, Nose, Mouth, Throat: MMM, Normal external inspection ears/nares/mouth/lips/gums.  Neck: No masses, trachea midline.   Respiratory: Normal respiratory effort. no wheeze, no rhonchi, no rales  Cardiovascular:  S1/S2 normal, no murmur, no rub/gallop auscultated. RRR.   Musculoskeletal: Gait normal. Symmetric and independent movement of all extremities  Neurological: Normal balance/coordination. No tremor.  Skin: warm, dry, intact.   Psychiatric: Normal judgment/insight. Normal mood and affect. Oriented x3.       Visit summary with medication list and pertinent instructions was printed for patient to review, patient was  advised to alert [place] if any updates are needed. All questions at time of visit were answered - patient instructed to contact office with any additional concerns. ER/RTC precautions were reviewed with the patient and understanding verbalized.   Note: Total time spent 25 minutes, greater than 50% of the visit was spent face-to-face counseling and coordinating care for the following: The primary encounter diagnosis was Primary insomnia. Diagnoses of Elevated fasting glucose, Anxiety associated with depression, Prediabetes, and Essential hypertension were also pertinent to this visit.Marland Kitchen  Please note: voice recognition software was used to produce this document, and typos may escape review. Please contact Dr. Sheppard Coil for any needed clarifications.    Follow up plan: Return in about 6 months (around 01/16/2019) for annual / labs w/ Dr Sheppard Coil. Sooner if needed. Marland Kitchen

## 2018-07-17 NOTE — Patient Instructions (Signed)
Half dose Remeron for a month Then try stopping this   Can see how you do coming off this, can consider Ozempic Rx for weight loss/ sugars.

## 2018-07-19 ENCOUNTER — Encounter: Payer: Self-pay | Admitting: Osteopathic Medicine

## 2018-07-20 ENCOUNTER — Other Ambulatory Visit: Payer: Self-pay | Admitting: Osteopathic Medicine

## 2018-07-20 DIAGNOSIS — M8580 Other specified disorders of bone density and structure, unspecified site: Secondary | ICD-10-CM

## 2018-08-07 ENCOUNTER — Encounter: Payer: Self-pay | Admitting: Physical Medicine and Rehabilitation

## 2018-08-07 ENCOUNTER — Ambulatory Visit (INDEPENDENT_AMBULATORY_CARE_PROVIDER_SITE_OTHER): Payer: Medicare Other | Admitting: Physical Medicine and Rehabilitation

## 2018-08-07 ENCOUNTER — Other Ambulatory Visit: Payer: Self-pay

## 2018-08-07 DIAGNOSIS — R202 Paresthesia of skin: Secondary | ICD-10-CM

## 2018-08-07 NOTE — Progress Notes (Signed)
  Numeric Pain Rating Scale and Functional Assessment Average Pain 3   In the last MONTH (on 0-10 scale) has pain interfered with the following?  1. General activity like being  able to carry out your everyday physical activities such as walking, climbing stairs, carrying groceries, or moving a chair?  Rating(2)

## 2018-08-08 ENCOUNTER — Other Ambulatory Visit: Payer: Self-pay | Admitting: Osteopathic Medicine

## 2018-08-08 DIAGNOSIS — F321 Major depressive disorder, single episode, moderate: Secondary | ICD-10-CM

## 2018-08-08 NOTE — Telephone Encounter (Signed)
Forwarding medication refill to PCP for review. 

## 2018-08-12 NOTE — Procedures (Signed)
EMG & NCV Findings: All nerve conduction studies (as indicated in the following tables) were within normal limits.    All examined muscles (as indicated in the following table) showed no evidence of electrical instability.    Impression: Essentially NORMAL electrodiagnostic study of the left upper limb.    There is no significant electrodiagnostic evidence of nerve entrapment, brachial plexopathy or cervical radiculopathy.  As you know, purely sensory or demyelinating radiculopathies and chemical radiculitis may not be detected with this particular electrodiagnostic study.  Recommendations: 1.  Follow-up with referring physician. 2.  Continue current management of symptoms. 3.  Continue use of resting splint at night-time and as needed during the day.  ___________________________ Wonda Olds Board Certified, American Board of Physical Medicine and Rehabilitation    Nerve Conduction Studies Anti Sensory Summary Table   Stim Site NR Peak (ms) Norm Peak (ms) P-T Amp (V) Norm P-T Amp Site1 Site2 Delta-P (ms) Dist (cm) Vel (m/s) Norm Vel (m/s)  Left Median Acr Palm Anti Sensory (2nd Digit)  32.5C  Wrist    3.6 <3.6 25.6 >10 Wrist Palm 1.8 0.0    Palm    1.8 <2.0 25.1         Left Radial Anti Sensory (Base 1st Digit)  31.6C  Wrist    2.2 <3.1 35.9  Wrist Base 1st Digit 2.2 0.0    Left Ulnar Anti Sensory (5th Digit)  32.7C  Wrist    3.2 <3.7 20.6 >15.0 Wrist 5th Digit 3.2 14.0 44 >38   Motor Summary Table   Stim Site NR Onset (ms) Norm Onset (ms) O-P Amp (mV) Norm O-P Amp Site1 Site2 Delta-0 (ms) Dist (cm) Vel (m/s) Norm Vel (m/s)  Left Median Motor (Abd Poll Brev)  32.1C  Wrist    3.4 <4.2 5.5 >5 Elbow Wrist 3.9 19.5 50 >50  Elbow    7.3  1.5         Left Ulnar Motor (Abd Dig Min)  32C  Wrist    2.9 <4.2 9.5 >3 B Elbow Wrist 3.1 18.5 60 >53  B Elbow    6.0  7.8  A Elbow B Elbow 1.4 9.5 68 >53  A Elbow    7.4  10.3          EMG   Side Muscle Nerve Root Ins Act Fibs  Psw Amp Dur Poly Recrt Int Fraser Din Comment  Left Abd Poll Brev Median C8-T1 Nml Nml Nml Nml Nml 0 Nml Nml   Left 1stDorInt Ulnar C8-T1 Nml Nml Nml Nml Nml 0 Nml Nml   Left PronatorTeres Median C6-7 Nml Nml Nml Nml Nml 0 Nml Nml   Left Biceps Musculocut C5-6 Nml Nml Nml Nml Nml 0 Nml Nml   Left Deltoid Axillary C5-6 Nml Nml Nml Nml Nml 0 Nml Nml     Nerve Conduction Studies Anti Sensory Left/Right Comparison   Stim Site L Lat (ms) R Lat (ms) L-R Lat (ms) L Amp (V) R Amp (V) L-R Amp (%) Site1 Site2 L Vel (m/s) R Vel (m/s) L-R Vel (m/s)  Median Acr Palm Anti Sensory (2nd Digit)  32.5C  Wrist 3.6   25.6   Wrist Palm     Palm 1.8   25.1         Radial Anti Sensory (Base 1st Digit)  31.6C  Wrist 2.2   35.9   Wrist Base 1st Digit     Ulnar Anti Sensory (5th Digit)  32.7C  Wrist 3.2  20.6   Wrist 5th Digit 44     Motor Left/Right Comparison   Stim Site L Lat (ms) R Lat (ms) L-R Lat (ms) L Amp (mV) R Amp (mV) L-R Amp (%) Site1 Site2 L Vel (m/s) R Vel (m/s) L-R Vel (m/s)  Median Motor (Abd Poll Brev)  32.1C  Wrist 3.4   5.5   Elbow Wrist 50    Elbow 7.3   1.5         Ulnar Motor (Abd Dig Min)  32C  Wrist 2.9   9.5   B Elbow Wrist 60    B Elbow 6.0   7.8   A Elbow B Elbow 68    A Elbow 7.4   10.3            Waveforms:

## 2018-08-12 NOTE — Progress Notes (Signed)
[name] - 73 y.o. female MRN [id number]  Date of birth: [birth year]  Office Visit Note: Visit Date: 08/07/2018 PCP: Emeterio Reeve, DO Referred by: Emeterio Reeve, DO  Subjective: Chief Complaint  Patient presents with  . Left Hand - Numbness   HPI:  [name] is a 73 y.o. female who comes in today At the request of Dr. Eduard Roux for electrodiagnostic study of the left upper limb.  Patient is right-hand dominant with a history of prior right carpal tunnel release in 2015 in [place].  She reports similar symptoms now on the left.  No frank radicular symptoms.  No history of diabetes.  She endorses tingling and numbness with sitting with her arms on armrest.  She also gets symptoms when she first wakes up in the morning.  If she really had to pick its the second finger on the left that is worse than the rest of the fingers.  She also has a lot of pain from arthritis.  ROS Otherwise per HPI.  Assessment & Plan: Visit Diagnoses:  1. Paresthesia of skin     Plan:   Meds & Orders: No orders of the defined types were placed in this encounter.   Orders Placed This Encounter  Procedures  . NCV with EMG (electromyography)    Follow-up: Return if symptoms worsen or fail to improve, for  Eduard Roux, M.D..   Procedures: No procedures performed  EMG & NCV Findings: All nerve conduction studies (as indicated in the following tables) were within normal limits.    All examined muscles (as indicated in the following table) showed no evidence of electrical instability.    Impression: Essentially NORMAL electrodiagnostic study of the left upper limb.    There is no significant electrodiagnostic evidence of nerve entrapment, brachial plexopathy or cervical radiculopathy.  As you know, purely sensory or demyelinating radiculopathies and chemical radiculitis may not be detected with this particular electrodiagnostic study.  Recommendations: 1.  Follow-up with  referring physician. 2.  Continue current management of symptoms. 3.  Continue use of resting splint at night-time and as needed during the day.  ___________________________ Wonda Olds Board Certified, American Board of Physical Medicine and Rehabilitation    Nerve Conduction Studies Anti Sensory Summary Table   Stim Site NR Peak (ms) Norm Peak (ms) P-T Amp (V) Norm P-T Amp Site1 Site2 Delta-P (ms) Dist (cm) Vel (m/s) Norm Vel (m/s)  Left Median Acr Palm Anti Sensory (2nd Digit)  32.5C  Wrist    3.6 <3.6 25.6 >10 Wrist Palm 1.8 0.0    Palm    1.8 <2.0 25.1         Left Radial Anti Sensory (Base 1st Digit)  31.6C  Wrist    2.2 <3.1 35.9  Wrist Base 1st Digit 2.2 0.0    Left Ulnar Anti Sensory (5th Digit)  32.7C  Wrist    3.2 <3.7 20.6 >15.0 Wrist 5th Digit 3.2 14.0 44 >38   Motor Summary Table   Stim Site NR Onset (ms) Norm Onset (ms) O-P Amp (mV) Norm O-P Amp Site1 Site2 Delta-0 (ms) Dist (cm) Vel (m/s) Norm Vel (m/s)  Left Median Motor (Abd Poll Brev)  32.1C  Wrist    3.4 <4.2 5.5 >5 Elbow Wrist 3.9 19.5 50 >50  Elbow    7.3  1.5         Left Ulnar Motor (Abd Dig Min)  32C  Wrist    2.9 <4.2 9.5 >3 B Elbow Wrist  3.1 18.5 60 >53  B Elbow    6.0  7.8  A Elbow B Elbow 1.4 9.5 68 >53  A Elbow    7.4  10.3          EMG   Side Muscle Nerve Root Ins Act Fibs Psw Amp Dur Poly Recrt Int Fraser Din Comment  Left Abd Poll Brev Median C8-T1 Nml Nml Nml Nml Nml 0 Nml Nml   Left 1stDorInt Ulnar C8-T1 Nml Nml Nml Nml Nml 0 Nml Nml   Left PronatorTeres Median C6-7 Nml Nml Nml Nml Nml 0 Nml Nml   Left Biceps Musculocut C5-6 Nml Nml Nml Nml Nml 0 Nml Nml   Left Deltoid Axillary C5-6 Nml Nml Nml Nml Nml 0 Nml Nml     Nerve Conduction Studies Anti Sensory Left/Right Comparison   Stim Site L Lat (ms) R Lat (ms) L-R Lat (ms) L Amp (V) R Amp (V) L-R Amp (%) Site1 Site2 L Vel (m/s) R Vel (m/s) L-R Vel (m/s)  Median Acr Palm Anti Sensory (2nd Digit)  32.5C  Wrist 3.6   25.6   Wrist  Palm     Palm 1.8   25.1         Radial Anti Sensory (Base 1st Digit)  31.6C  Wrist 2.2   35.9   Wrist Base 1st Digit     Ulnar Anti Sensory (5th Digit)  32.7C  Wrist 3.2   20.6   Wrist 5th Digit 44     Motor Left/Right Comparison   Stim Site L Lat (ms) R Lat (ms) L-R Lat (ms) L Amp (mV) R Amp (mV) L-R Amp (%) Site1 Site2 L Vel (m/s) R Vel (m/s) L-R Vel (m/s)  Median Motor (Abd Poll Brev)  32.1C  Wrist 3.4   5.5   Elbow Wrist 50    Elbow 7.3   1.5         Ulnar Motor (Abd Dig Min)  32C  Wrist 2.9   9.5   B Elbow Wrist 60    B Elbow 6.0   7.8   A Elbow B Elbow 68    A Elbow 7.4   10.3            Waveforms:            Clinical History: No specialty comments available.     Objective:  VS:  HT:    WT:   BMI:     BP:   HR: bpm  TEMP: ( )  RESP:  Physical Exam Musculoskeletal:        General: No swelling, tenderness or deformity.     Comments: Inspection reveals no atrophy of the bilateral APB or FDI or hand intrinsics. There is no swelling, color changes, allodynia or dystrophic changes. There is 5 out of 5 strength in the bilateral wrist extension, finger abduction and long finger flexion. There is intact sensation to light touch in all dermatomal and peripheral nerve distributions.  There is a negative Hoffmann's test bilaterally.  Skin:    General: Skin is warm and dry.     Findings: No erythema or rash.  Neurological:     General: No focal deficit present.     Mental Status: She is alert and oriented to person, place, and time.     Motor: No weakness or abnormal muscle tone.     Coordination: Coordination normal.  Psychiatric:        Mood and Affect: Mood normal.  Behavior: Behavior normal.     Ortho Exam Imaging: No results found.

## 2018-08-13 ENCOUNTER — Ambulatory Visit: Payer: Medicare Other | Admitting: Orthopaedic Surgery

## 2018-08-19 ENCOUNTER — Other Ambulatory Visit: Payer: Self-pay | Admitting: Osteopathic Medicine

## 2018-08-20 NOTE — Telephone Encounter (Signed)
Please advise 

## 2018-08-22 ENCOUNTER — Encounter: Payer: Self-pay | Admitting: Osteopathic Medicine

## 2018-08-22 MED ORDER — ZOLPIDEM TARTRATE 10 MG PO TABS: 10.0000 mg | ORAL_TABLET | Freq: Every evening | ORAL | 2 refills | Status: DC | PRN

## 2018-09-04 ENCOUNTER — Other Ambulatory Visit: Payer: Self-pay

## 2018-09-04 ENCOUNTER — Encounter: Payer: Self-pay | Admitting: Orthopaedic Surgery

## 2018-09-04 ENCOUNTER — Ambulatory Visit (INDEPENDENT_AMBULATORY_CARE_PROVIDER_SITE_OTHER): Payer: Medicare Other | Admitting: Orthopaedic Surgery

## 2018-09-04 DIAGNOSIS — M65321 Trigger finger, right index finger: Secondary | ICD-10-CM | POA: Diagnosis not present

## 2018-09-04 DIAGNOSIS — G5602 Carpal tunnel syndrome, left upper limb: Secondary | ICD-10-CM

## 2018-09-04 MED ORDER — METHYLPREDNISOLONE ACETATE 40 MG/ML IJ SUSP
13.3300 mg | INTRAMUSCULAR | Status: AC | PRN
  Administered 2018-09-04: 13.33 mg

## 2018-09-04 MED ORDER — LIDOCAINE HCL 1 % IJ SOLN
0.3000 mL | INTRAMUSCULAR | Status: AC | PRN
  Administered 2018-09-04: .3 mL

## 2018-09-04 MED ORDER — BUPIVACAINE HCL 0.5 % IJ SOLN
0.3300 mL | INTRAMUSCULAR | Status: AC | PRN
  Administered 2018-09-04: .33 mL

## 2018-09-04 NOTE — Progress Notes (Signed)
Office Visit Note   Patient: [name]           Date of Birth: [birth year]           MRN: [id number] Visit Date: 09/04/2018              Requested by: Emeterio Reeve, [address] [place],  [place] [id number] PCP: Emeterio Reeve, DO   Assessment & Plan: Visit Diagnoses:  1. Trigger finger, right index finger   2. Left carpal tunnel syndrome     Plan: Impression is recurrent right index trigger finger and questionable mild left carpal tunnel syndrome.  For the trigger finger we repeated the injection today.  She understands that if the injections fail to provide her with long-term relief that surgery would be the more definitive solution.  For the left hand she would like to start with nighttime bracing.  She politely declined a carpal tunnel injection today.  We will see her back as needed.  Follow-Up Instructions: Return if symptoms worsen or fail to improve.   Orders:  No orders of the defined types were placed in this encounter.  No orders of the defined types were placed in this encounter.     Procedures: Hand/UE Inj: R index A1 for trigger finger on 09/04/2018 5:28 PM Indications: pain Details: 25 G needle Medications: 0.3 mL lidocaine 1 %; 0.33 mL bupivacaine 0.5 %; 13.33 mg methylPREDNISolone acetate 40 MG/ML Outcome: tolerated well, no immediate complications Consent was given by the patient. Patient was prepped and draped in the usual sterile fashion.       Clinical Data: No additional findings.   Subjective: Chief Complaint  Patient presents with  . Left Arm - Follow-up    [name] follows up today for nerve conduction studies and recurrent right trigger finger.  She has had 1 previous injection back in February but this has recurred.  Her nerve conduction studies were negative but she is states that she does endorse burning numbing pain that is worse at night and in the morning.   Review of Systems  Constitutional: Negative.    HENT: Negative.   Eyes: Negative.   Respiratory: Negative.   Cardiovascular: Negative.   Endocrine: Negative.   Musculoskeletal: Negative.   Neurological: Negative.   Hematological: Negative.   Psychiatric/Behavioral: Negative.   All other systems reviewed and are negative.    Objective: Vital Signs: There were no vitals taken for this visit.  Physical Exam Vitals signs and nursing note reviewed.  Constitutional:      Appearance: She is well-developed.  Pulmonary:     Effort: Pulmonary effort is normal.  Skin:    General: Skin is warm.     Capillary Refill: Capillary refill takes less than 2 seconds.  Neurological:     Mental Status: She is alert and oriented to person, place, and time.  Psychiatric:        Behavior: Behavior normal.        Thought Content: Thought content normal.        Judgment: Judgment normal.     Ortho Exam Right hand exam shows tender nodule near the A1 pulley of the index finger.  There is reproducible triggering. Left hand exam is unchanged. Specialty Comments:  No specialty comments available.  Imaging: No results found.   PMFS History: Patient Active Problem List   Diagnosis Date Noted  . Post-menopausal 05/14/2017  . Prediabetes 01/12/2017  . Snoring 07/20/2016  . Obsessive-compulsive personality trait  12/07/2015  . Morbid obesity ([place]) 09/19/2015  . Primary localized osteoarthritis of right knee   . Cardiac risk counseling 08/16/2015  . Essential hypertension 08/04/2015  . Hyperlipidemia 08/04/2015  . Osteopenia 08/04/2015  . Anxiety associated with depression 08/04/2015  . Right knee pain 06/14/2015  . Hyposomnia, insomnia or sleeplessness associated with anxiety 06/14/2015   Past Medical History:  Diagnosis Date  . Anxiety   . Depression   . Headache    SINUS  . Hyperlipidemia   . Hypertension   . Osteopenia   . Primary localized osteoarthritis of right knee     Family History  Problem Relation Age of Onset  .  Heart failure Mother   . Heart attack Mother   . Alcohol abuse Maternal Aunt   . Heart attack Father   . Irregular heart beat Sister   . Hypertension Sister   . Heart disease Brother     Past Surgical History:  Procedure Laterality Date  . BREAST LUMPECTOMY Right 1997   Done in [place]  . CARPAL TUNNEL RELEASE Right 2015   Ortho in [place]  . LASIK    . MENISCUS REPAIR Right 2011   Orthopedic in [place]  . TOTAL KNEE ARTHROPLASTY Right 09/20/2015  . TOTAL KNEE ARTHROPLASTY Right 09/20/2015   Procedure: TOTAL KNEE ARTHROPLASTY;  Surgeon: Elsie Saas, MD;  Location: [facility];  Service: Orthopedics;  Laterality: Right;  . Trigger Thumb Right 2015   Done in [facility] at the same time as the carpal tunnel release  . TUBAL LIGATION  1980   Social History   Occupational History  . Occupation: retired    Comment: Licensed conveyancer  Tobacco Use  . Smoking status: Never Smoker  . Smokeless tobacco: Never Used  Substance and Sexual Activity  . Alcohol use: No    Comment: 1 glass a month  . Drug use: No  . Sexual activity: Yes

## 2018-09-15 ENCOUNTER — Other Ambulatory Visit: Payer: Self-pay | Admitting: Osteopathic Medicine

## 2018-09-15 DIAGNOSIS — I10 Essential (primary) hypertension: Secondary | ICD-10-CM

## 2018-09-18 ENCOUNTER — Other Ambulatory Visit: Payer: Self-pay | Admitting: Osteopathic Medicine

## 2018-09-19 ENCOUNTER — Telehealth: Payer: Medicare Other | Admitting: Physician Assistant

## 2018-09-19 DIAGNOSIS — B9689 Other specified bacterial agents as the cause of diseases classified elsewhere: Secondary | ICD-10-CM

## 2018-09-19 DIAGNOSIS — J019 Acute sinusitis, unspecified: Secondary | ICD-10-CM | POA: Diagnosis not present

## 2018-09-19 MED ORDER — DOXYCYCLINE HYCLATE 100 MG PO CAPS: 100.0000 mg | ORAL_CAPSULE | Freq: Two times a day (BID) | ORAL | 0 refills | Status: DC

## 2018-09-19 NOTE — Progress Notes (Signed)
I have spent 5 minutes in review of e-visit questionnaire, review and updating patient chart, medical decision making and response to patient.   Torin Modica Cody Abbrielle Batts, PA-C    

## 2018-09-19 NOTE — Progress Notes (Signed)

## 2018-09-26 ENCOUNTER — Other Ambulatory Visit: Payer: Self-pay | Admitting: Osteopathic Medicine

## 2018-09-26 DIAGNOSIS — I10 Essential (primary) hypertension: Secondary | ICD-10-CM

## 2018-09-26 DIAGNOSIS — F321 Major depressive disorder, single episode, moderate: Secondary | ICD-10-CM

## 2018-09-26 NOTE — Telephone Encounter (Signed)
Please advise 

## 2018-10-03 ENCOUNTER — Encounter: Payer: Self-pay | Admitting: Osteopathic Medicine

## 2018-10-03 ENCOUNTER — Ambulatory Visit (INDEPENDENT_AMBULATORY_CARE_PROVIDER_SITE_OTHER): Payer: Medicare Other | Admitting: Osteopathic Medicine

## 2018-10-03 ENCOUNTER — Other Ambulatory Visit: Payer: Self-pay

## 2018-10-03 VITALS — Temp 97.7°F

## 2018-10-03 DIAGNOSIS — J019 Acute sinusitis, unspecified: Secondary | ICD-10-CM | POA: Diagnosis not present

## 2018-10-03 DIAGNOSIS — F418 Other specified anxiety disorders: Secondary | ICD-10-CM

## 2018-10-03 MED ORDER — CLONAZEPAM 0.5 MG PO TABS: 0.2500 mg | ORAL_TABLET | Freq: Two times a day (BID) | ORAL | 2 refills | Status: DC | PRN

## 2018-10-03 MED ORDER — LEVOFLOXACIN 500 MG PO TABS: 500.0000 mg | ORAL_TABLET | Freq: Every day | ORAL | 0 refills | Status: DC

## 2018-10-03 MED ORDER — IPRATROPIUM BROMIDE 0.06 % NA SOLN: 2.0000 | Freq: Four times a day (QID) | NASAL | 12 refills | Status: DC

## 2018-10-03 NOTE — Patient Instructions (Signed)
  Antibiotics sent to pharmacy.  Instead of refilling doxycycline, will try Levaquin.  If this is not helpful after 4 to 5 days, please let me know and we may need to repeat a CT scan of the sinuses.  Can use the ipratropium/Atrovent as needed for sinus drainage/congestion, can use nasal saline spray to rinse.  Any questions or concerns, please let me know!

## 2018-10-03 NOTE — Progress Notes (Signed)
Virtual Visit via Video (App used: Doximity) Note  I connected with      [name] on 10/03/18 at 9:50 AM by a telemedicine application and verified that I am speaking with the correct person using two identifiers.  Patient is at home I am in office    I discussed the limitations of evaluation and management by telemedicine and the availability of in person appointments. The patient expressed understanding and agreed to proceed.  History of Present Illness: [name] is a 73 y.o. female who would like to discuss sinus concern    . Location: sinuses bilaterally, pain also behind R eye  . Quality: soreness/pressure, drainage  . Duration: about a month . Modifying factors: course of doxycycline helped but now congestion has returned  . Had CT of the sinuses 06/2016, showed some edema in the mucosa but no other anatomic pathology      Observations/Objective: Temp 97.7 F (36.5 C)  BP Readings from Last 3 Encounters:  07/17/18 (!) 148/80  02/18/18 128/60  11/28/17 (!) 156/55   Exam: Normal Speech.  NAD  Lab and Radiology Results No results found for this or any previous visit (from the past 72 hour(s)). No results found.     Assessment and Plan: 73 y.o. female with The primary encounter diagnosis was Subacute sinusitis, unspecified location. A diagnosis of Anxiety associated with depression was also pertinent to this visit.   PDMP not reviewed this encounter. No orders of the defined types were placed in this encounter.  Meds ordered this encounter  Medications  . clonazePAM (KLONOPIN) 0.5 MG tablet    Sig: Take 0.5-1 tablets (0.25-0.5 mg total) by mouth 2 (two) times daily as needed for anxiety. DO NOT TAKE WITH TEMAZEPAM OR AMBIEN    Dispense:  60 tablet    Refill:  2  . levofloxacin (LEVAQUIN) 500 MG tablet    Sig: Take 1 tablet (500 mg total) by mouth daily.    Dispense:  7 tablet    Refill:  0  . ipratropium (ATROVENT) 0.06 % nasal spray     Sig: Place 2 sprays into both nostrils 4 (four) times daily.    Dispense:  15 mL    Refill:  12   Patient Instructions   Antibiotics sent to pharmacy.  Instead of refilling doxycycline, will try Levaquin.  If this is not helpful after 4 to 5 days, please let me know and we may need to repeat a CT scan of the sinuses.  Can use the ipratropium/Atrovent as needed for sinus drainage/congestion, can use nasal saline spray to rinse.  Any questions or concerns, please let me know!    Instructions sent via MyChart. If MyChart not available, pt was given option for info via personal e-mail w/ no guarantee of protected health info over unsecured e-mail communication, and MyChart sign-up instructions were included.   Follow Up Instructions: Return if symptoms worsen or fail to improve.    I discussed the assessment and treatment plan with the patient. The patient was provided an opportunity to ask questions and all were answered. The patient agreed with the plan and demonstrated an understanding of the instructions.   The patient was advised to call back or seek an in-person evaluation if any new concerns, if symptoms worsen or if the condition fails to improve as anticipated.  25 minutes of non-face-to-face time was provided during this encounter.  Historical information moved to improve visibility of documentation.  Past Medical History:  Diagnosis Date  . Anxiety   . Depression   . Headache    SINUS  . Hyperlipidemia   . Hypertension   . Osteopenia   . Primary localized osteoarthritis of right knee    Past Surgical History:  Procedure Laterality Date  . BREAST LUMPECTOMY Right 1997   Done in [place]  . CARPAL TUNNEL RELEASE Right 2015   Ortho in [place]  . LASIK    . MENISCUS REPAIR Right 2011   Orthopedic in [place]  . TOTAL KNEE ARTHROPLASTY Right 09/20/2015  . TOTAL KNEE ARTHROPLASTY Right 09/20/2015   Procedure: TOTAL KNEE  ARTHROPLASTY;  Surgeon: Elsie Saas, MD;  Location: [facility];  Service: Orthopedics;  Laterality: Right;  . Trigger Thumb Right 2015   Done in [facility] at the same time as the carpal tunnel release  . TUBAL LIGATION  1980   Social History   Tobacco Use  . Smoking status: Never Smoker  . Smokeless tobacco: Never Used  Substance Use Topics  . Alcohol use: No    Comment: 1 glass a month   family history includes Alcohol abuse in her maternal aunt; Heart attack in her father and mother; Heart disease in her brother; Heart failure in her mother; Hypertension in her sister; Irregular heart beat in her sister.  Medications: Current Outpatient Medications  Medication Sig Dispense Refill  . amLODipine-benazepril (LOTREL) 5-20 MG capsule TAKE ONE CAPSULE BY MOUTH DAILY 90 capsule 1  . atorvastatin (LIPITOR) 20 MG tablet Take 1 tablet (20 mg total) by mouth daily. Needs lab work 90 tablet 0  . clonazePAM (KLONOPIN) 0.5 MG tablet Take 0.5-1 tablets (0.25-0.5 mg total) by mouth 2 (two) times daily as needed for anxiety. DO NOT TAKE WITH TEMAZEPAM OR AMBIEN 60 tablet 2  . doxycycline (VIBRAMYCIN) 100 MG capsule Take 1 capsule (100 mg total) by mouth 2 (two) times daily. 20 capsule 0  . fluticasone (FLONASE) 50 MCG/ACT nasal spray Place into both nostrils daily.    . meloxicam (MOBIC) 7.5 MG tablet TAKE 1 TO 2 TABLETS BY MOUTH DAILY AS DIRECTED FOR PAIN 60 tablet 3  . mirtazapine (REMERON) 15 MG tablet TAKE ONE-HALF TO ONE TABLET BY MOUTH AT BEDTIME 90 tablet 1  . raloxifene (EVISTA) 60 MG tablet TAKE ONE TABLET BY MOUTH DAILY NEED F/U VISIT WITH PCP 90 tablet 1  . temazepam (RESTORIL) 30 MG capsule Take 1 capsule (30 mg total) by mouth at bedtime as needed for sleep. 30 capsule 2  . venlafaxine XR (EFFEXOR-XR) 150 MG 24 hr capsule TAKE ONE CAPSULE BY MOUTH EVERY MORNING WITH BREAKFAST 90 capsule 2  . zolpidem (AMBIEN) 10 MG tablet Take 1 tablet (10 mg total) by mouth at bedtime as needed for sleep. 30  tablet 2  . ipratropium (ATROVENT) 0.06 % nasal spray Place 2 sprays into both nostrils 4 (four) times daily. 15 mL 12  . levofloxacin (LEVAQUIN) 500 MG tablet Take 1 tablet (500 mg total) by mouth daily. 7 tablet 0   No current facility-administered medications for this visit.    Allergies  Allergen Reactions  . Penicillins Hives    Tolerates Cephalosporins   . Sulfa Antibiotics Hives         PDMP not reviewed this encounter. No orders of the defined types were placed in this encounter.  Meds ordered this encounter  Medications  . clonazePAM (KLONOPIN) 0.5 MG tablet    Sig:  Take 0.5-1 tablets (0.25-0.5 mg total) by mouth 2 (two) times daily as needed for anxiety. DO NOT TAKE WITH TEMAZEPAM OR AMBIEN    Dispense:  60 tablet    Refill:  2  . levofloxacin (LEVAQUIN) 500 MG tablet    Sig: Take 1 tablet (500 mg total) by mouth daily.    Dispense:  7 tablet    Refill:  0  . ipratropium (ATROVENT) 0.06 % nasal spray    Sig: Place 2 sprays into both nostrils 4 (four) times daily.    Dispense:  15 mL    Refill:  12

## 2018-10-16 ENCOUNTER — Other Ambulatory Visit: Payer: Self-pay | Admitting: Osteopathic Medicine

## 2018-10-30 ENCOUNTER — Ambulatory Visit (INDEPENDENT_AMBULATORY_CARE_PROVIDER_SITE_OTHER): Payer: Medicare Other | Admitting: Osteopathic Medicine

## 2018-10-30 DIAGNOSIS — Z23 Encounter for immunization: Secondary | ICD-10-CM

## 2018-11-04 ENCOUNTER — Encounter: Payer: Self-pay | Admitting: Osteopathic Medicine

## 2018-11-14 ENCOUNTER — Encounter: Payer: Self-pay | Admitting: Osteopathic Medicine

## 2018-11-14 DIAGNOSIS — F5101 Primary insomnia: Secondary | ICD-10-CM

## 2018-11-15 MED ORDER — TEMAZEPAM 30 MG PO CAPS: 30.0000 mg | ORAL_CAPSULE | Freq: Every evening | ORAL | 0 refills | Status: DC | PRN

## 2018-11-17 ENCOUNTER — Other Ambulatory Visit: Payer: Self-pay | Admitting: Osteopathic Medicine

## 2018-12-12 ENCOUNTER — Encounter: Payer: Self-pay | Admitting: Osteopathic Medicine

## 2018-12-13 MED ORDER — ZOLPIDEM TARTRATE 10 MG PO TABS: 10.0000 mg | ORAL_TABLET | Freq: Every evening | ORAL | 2 refills | Status: DC | PRN

## 2018-12-26 ENCOUNTER — Other Ambulatory Visit: Payer: Self-pay | Admitting: Osteopathic Medicine

## 2018-12-26 DIAGNOSIS — F418 Other specified anxiety disorders: Secondary | ICD-10-CM

## 2018-12-26 NOTE — Telephone Encounter (Signed)
Requested medication (s) are due for refill today: yes  Requested medication (s) are on the active medication list: yes  Last refill:  11/28/2018  Future visit scheduled: yes  Notes to clinic:  Refill cannot be delegated    Requested Prescriptions  Pending Prescriptions Disp Refills   clonazePAM (KLONOPIN) 0.5 MG tablet [Pharmacy Med Name: clonazePAM 0.5 MG TABLET] 60 tablet 1    Sig: TAKE ONE-HALF TO ONE TABLET BY MOUTH TWO TIMES A DAY AS NEEDED FOR ANXIETY. DO NOT TAKE WITH TEMAZEPAM OR AMBIEN     Not Delegated - Psychiatry:  Anxiolytics/Hypnotics Failed - 12/26/2018 11:22 AM      Failed - This refill cannot be delegated      Failed - Urine Drug Screen completed in last 360 days.      Failed - Valid encounter within last 6 months    Recent Outpatient Visits          2 months ago Subacute sinusitis, unspecified location   [facility], Lanelle Bal, DO   5 months ago Primary insomnia   [facility], Lanelle Bal, DO   1 year ago Needs flu shot   [facility], Lanelle Bal, DO   1 year ago Primary insomnia   [facility], Lanelle Bal, DO   1 year ago Recurrent sinus infections   [facility] [facility], Lanelle Bal, DO      Future Appointments            In 2 months [facility]

## 2019-01-12 ENCOUNTER — Other Ambulatory Visit: Payer: Self-pay | Admitting: Osteopathic Medicine

## 2019-01-13 NOTE — Telephone Encounter (Signed)
Needs office visit- BP

## 2019-01-14 ENCOUNTER — Other Ambulatory Visit: Payer: Self-pay | Admitting: Osteopathic Medicine

## 2019-01-14 DIAGNOSIS — F419 Anxiety disorder, unspecified: Secondary | ICD-10-CM

## 2019-01-14 DIAGNOSIS — E782 Mixed hyperlipidemia: Secondary | ICD-10-CM

## 2019-01-14 DIAGNOSIS — F418 Other specified anxiety disorders: Secondary | ICD-10-CM

## 2019-01-14 DIAGNOSIS — I10 Essential (primary) hypertension: Secondary | ICD-10-CM

## 2019-01-14 DIAGNOSIS — M8580 Other specified disorders of bone density and structure, unspecified site: Secondary | ICD-10-CM

## 2019-01-14 DIAGNOSIS — R7303 Prediabetes: Secondary | ICD-10-CM

## 2019-01-14 DIAGNOSIS — Z0000 Encounter for general adult medical examination without abnormal findings: Secondary | ICD-10-CM

## 2019-01-14 DIAGNOSIS — Z8639 Personal history of other endocrine, nutritional and metabolic disease: Secondary | ICD-10-CM

## 2019-01-14 NOTE — Progress Notes (Signed)
Lab orders are in

## 2019-01-16 ENCOUNTER — Encounter: Payer: Self-pay | Admitting: Osteopathic Medicine

## 2019-01-16 ENCOUNTER — Ambulatory Visit (INDEPENDENT_AMBULATORY_CARE_PROVIDER_SITE_OTHER): Payer: Medicare Other | Admitting: Osteopathic Medicine

## 2019-01-16 VITALS — Temp 98.1°F | Wt 212.0 lb

## 2019-01-16 DIAGNOSIS — E782 Mixed hyperlipidemia: Secondary | ICD-10-CM

## 2019-01-16 DIAGNOSIS — F419 Anxiety disorder, unspecified: Secondary | ICD-10-CM | POA: Diagnosis not present

## 2019-01-16 DIAGNOSIS — F5105 Insomnia due to other mental disorder: Secondary | ICD-10-CM | POA: Diagnosis not present

## 2019-01-16 DIAGNOSIS — R7303 Prediabetes: Secondary | ICD-10-CM

## 2019-01-16 DIAGNOSIS — I10 Essential (primary) hypertension: Secondary | ICD-10-CM | POA: Diagnosis not present

## 2019-01-16 DIAGNOSIS — F418 Other specified anxiety disorders: Secondary | ICD-10-CM

## 2019-01-16 NOTE — Patient Instructions (Addendum)
Plan:  Okay to stop the mirtazapine.  If you are not feeling well off of this over the next month, we do not need to make any changes.  If you are feeling worse mentally, please call the office to schedule a visit so that we can discuss alternative medications to help with depression/anxiety.  If you would like me to place a referral for a therapist/counselor, please let me know!  Okay to stop the meloxicam.  Okay to take ibuprofen as needed, can also add Tylenol to this, maximum dose of Tylenol is 1000 mg up to 4 times per day.  Get labs done and will see what A1c/prediabetes levels are looking like.  We will probably go ahead and start Metformin.   Work on reduced carbohydrate/reduced fat diet and try to increase exercise as tolerated/able!  Return for RECHECK PENDING LAB RESULTS /pressure follow-up in 6 months (around 07/17/2019) .

## 2019-01-16 NOTE — Progress Notes (Signed)
Virtual Visit via Video (App used: Doximity) Note  I connected with      [name] on 01/16/19 at 11:30 by a telemedicine application and verified that I am speaking with the correct person using two identifiers.  Patient is at home I am in office   I discussed the limitations of evaluation and management by telemedicine and the availability of in person appointments. The patient expressed understanding and agreed to proceed.  History of Present Illness: [name] is a 73 y.o. female who would like to discuss medications - insomnia, depression    Patient overall doing well since last visit, we have been switching back and forth between Ambien and temazepam for sleep, patient has chronic debilitating insomnia.  She would like to stop the Remeron, this does not seem to be making much difference in her mood or sleep, she is concerned it might be causing some weight gain.   Arthritis has been bothering her a bit more lately.  She does not think the meloxicam is really doing much to help.  Ibuprofen here and there helped somewhat.    Observations/Objective: Temp 98.1 F (36.7 C) (Oral)   Wt 212 lb (96.2 kg)   BMI 38.78 kg/m  BP Readings from Last 3 Encounters:  07/17/18 (!) 148/80  02/18/18 128/60  11/28/17 (!) 156/55   Exam: Normal Speech.  NAD  Lab and Radiology Results No results found for this or any previous visit (from the past 72 hour(s)). No results found.     Assessment and Plan: 73 y.o. female with The primary encounter diagnosis was Prediabetes. Diagnoses of Mixed hyperlipidemia, Essential hypertension, Anxiety associated with depression, and Hyposomnia, insomnia or sleeplessness associated with anxiety were also pertinent to this visit.   PDMP not reviewed this encounter. No orders of the defined types were placed in this encounter.  No orders of the defined types were placed in this encounter.  Patient Instructions  Plan:  Okay to stop the  mirtazapine.  If you are not feeling well off of this over the next month, we do not need to make any changes.  If you are feeling worse mentally, please call the office to schedule a visit so that we can discuss alternative medications to help with depression/anxiety.  If you would like me to place a referral for a therapist/counselor, please let me know!  Okay to stop the meloxicam.  Okay to take ibuprofen as needed, can also add Tylenol to this, maximum dose of Tylenol is 1000 mg up to 4 times per day.  Get labs done and will see what A1c/prediabetes levels are looking like.  We will probably go ahead and start Metformin.   Work on reduced carbohydrate/reduced fat diet and try to increase exercise as tolerated/able!     Instructions sent via MyChart. If MyChart not available, pt was given option for info via personal e-mail w/ no guarantee of protected health info over unsecured e-mail communication, and MyChart sign-up instructions were sent to patient.   Follow Up Instructions: Return for RECHECK PENDING LAB RESULTS /pressure follow-up in 6 months (around 07/17/2019) .    I discussed the assessment and treatment plan with the patient. The patient was provided an opportunity to ask questions and all were answered. The patient agreed with the plan and demonstrated an understanding of the instructions.   The patient was advised to call back or seek an in-person evaluation if any new concerns, if symptoms worsen or if the condition fails  to improve as anticipated.  25 minutes of non-face-to-face time was provided during this encounter.      . . . . . . . . . . . . . Marland Kitchen                   Historical information moved to improve visibility of documentation.  Past Medical History:  Diagnosis Date  . Anxiety   . Depression   . Headache    SINUS  . Hyperlipidemia   . Hypertension   . Osteopenia   . Primary localized osteoarthritis of right knee    Past  Surgical History:  Procedure Laterality Date  . BREAST LUMPECTOMY Right 1997   Done in [place]  . CARPAL TUNNEL RELEASE Right 2015   Ortho in [place]  . LASIK    . MENISCUS REPAIR Right 2011   Orthopedic in [place]  . TOTAL KNEE ARTHROPLASTY Right 09/20/2015  . TOTAL KNEE ARTHROPLASTY Right 09/20/2015   Procedure: TOTAL KNEE ARTHROPLASTY;  Surgeon: Elsie Saas, MD;  Location: [facility];  Service: Orthopedics;  Laterality: Right;  . Trigger Thumb Right 2015   Done in [facility] at the same time as the carpal tunnel release  . TUBAL LIGATION  1980   Social History   Tobacco Use  . Smoking status: Never Smoker  . Smokeless tobacco: Never Used  Substance Use Topics  . Alcohol use: No    Comment: 1 glass a month   family history includes Alcohol abuse in her maternal aunt; Heart attack in her father and mother; Heart disease in her brother; Heart failure in her mother; Hypertension in her sister; Irregular heart beat in her sister.  Medications: Current Outpatient Medications  Medication Sig Dispense Refill  . amLODipine-benazepril (LOTREL) 5-20 MG capsule TAKE ONE CAPSULE BY MOUTH DAILY 90 capsule 1  . atorvastatin (LIPITOR) 20 MG tablet TAKE ONE TABLET BY MOUTH DAILY. *NEEDS LAB WORK* 90 tablet 0  . clonazePAM (KLONOPIN) 0.5 MG tablet TAKE ONE-HALF TO ONE TABLET BY MOUTH TWO TIMES A DAY AS NEEDED FOR ANXIETY. DO NOT TAKE WITH TEMAZEPAM OR AMBIEN 60 tablet 1  . fluticasone (FLONASE) 50 MCG/ACT nasal spray Place into both nostrils daily.    . meloxicam (MOBIC) 7.5 MG tablet TAKE 1 TO 2 TABLETS BY MOUTH DAILY AS DIRECTED FOR PAIN 60 tablet 3  . raloxifene (EVISTA) 60 MG tablet TAKE ONE TABLET BY MOUTH DAILY 90 tablet 3  . temazepam (RESTORIL) 30 MG capsule Take 1 capsule (30 mg total) by mouth at bedtime as needed for sleep. 30 capsule 0  . triamterene-hydrochlorothiazide (DYAZIDE) 37.5-25 MG capsule Take 1 each (1 capsule total) by mouth daily. NEEDS OFFICE VISIT 30 capsule 0  .  venlafaxine XR (EFFEXOR-XR) 150 MG 24 hr capsule TAKE ONE CAPSULE BY MOUTH EVERY MORNING WITH BREAKFAST 90 capsule 2  . zolpidem (AMBIEN) 10 MG tablet Take 1 tablet (10 mg total) by mouth at bedtime as needed for sleep. 30 tablet 2   No current facility-administered medications for this visit.    Allergies  Allergen Reactions  . Penicillins Hives    Tolerates Cephalosporins   . Sulfa Antibiotics Hives

## 2019-01-18 LAB — CBC
HCT: 34.3 % — ABNORMAL LOW (ref 35.0–45.0)
Hemoglobin: 11.2 g/dL — ABNORMAL LOW (ref 11.7–15.5)
MCH: 26.6 pg — ABNORMAL LOW (ref 27.0–33.0)
MCHC: 32.7 g/dL (ref 32.0–36.0)
MCV: 81.5 fL (ref 80.0–100.0)
MPV: 10 fL (ref 7.5–12.5)
Platelets: 440 10*3/uL — ABNORMAL HIGH (ref 140–400)
RBC: 4.21 10*6/uL (ref 3.80–5.10)
RDW: 15.6 % — ABNORMAL HIGH (ref 11.0–15.0)
WBC: 8.7 10*3/uL (ref 3.8–10.8)

## 2019-01-18 LAB — COMPLETE METABOLIC PANEL WITH GFR
AG Ratio: 1.7 (calc) (ref 1.0–2.5)
ALT: 21 U/L (ref 6–29)
AST: 22 U/L (ref 10–35)
Albumin: 4.4 g/dL (ref 3.6–5.1)
Alkaline phosphatase (APISO): 105 U/L (ref 37–153)
BUN/Creatinine Ratio: 22 (calc) (ref 6–22)
BUN: 27 mg/dL — ABNORMAL HIGH (ref 7–25)
CO2: 25 mmol/L (ref 20–32)
Calcium: 9.4 mg/dL (ref 8.6–10.4)
Chloride: 103 mmol/L (ref 98–110)
Creat: 1.23 mg/dL — ABNORMAL HIGH (ref 0.60–0.93)
GFR, Est African American: 50 mL/min/{1.73_m2} — ABNORMAL LOW (ref >=60)
GFR, Est Non African American: 43 mL/min/{1.73_m2} — ABNORMAL LOW (ref >=60)
Globulin: 2.6 g/dL (calc) (ref 1.9–3.7)
Glucose, Bld: 103 mg/dL — ABNORMAL HIGH (ref 65–99)
Potassium: 4.2 mmol/L (ref 3.5–5.3)
Sodium: 142 mmol/L (ref 135–146)
Total Bilirubin: 0.3 mg/dL (ref 0.2–1.2)
Total Protein: 7 g/dL (ref 6.1–8.1)

## 2019-01-18 LAB — HEMOGLOBIN A1C
Hgb A1c MFr Bld: 6.2 % of total Hgb — ABNORMAL HIGH (ref <5.7)
Mean Plasma Glucose: 131 (calc)
eAG (mmol/L): 7.3 (calc)

## 2019-01-18 LAB — LIPID PANEL
Cholesterol: 158 mg/dL (ref <200)
HDL: 56 mg/dL (ref >=50)
LDL Cholesterol (Calc): 81 mg/dL (calc)
Non-HDL Cholesterol (Calc): 102 mg/dL (calc) (ref <130)
Total CHOL/HDL Ratio: 2.8 (calc) (ref <5.0)
Triglycerides: 110 mg/dL (ref <150)

## 2019-01-21 ENCOUNTER — Encounter: Payer: Self-pay | Admitting: Osteopathic Medicine

## 2019-01-22 MED ORDER — METFORMIN HCL ER 500 MG PO TB24: 500.0000 mg | ORAL_TABLET | Freq: Every day | ORAL | 1 refills | Status: DC

## 2019-02-11 ENCOUNTER — Other Ambulatory Visit: Payer: Self-pay | Admitting: Osteopathic Medicine

## 2019-02-11 NOTE — Telephone Encounter (Signed)
Please advise if ok to fill?  

## 2019-02-11 NOTE — Telephone Encounter (Signed)
Requested medication (s) are due for refill today: yes  Requested medication (s) are on the active medication list: yes  Last refill:  01/14/2019  Future visit scheduled: yes  Notes to clinic:  review for refill   Requested Prescriptions  Pending Prescriptions Disp Refills   triamterene-hydrochlorothiazide (DYAZIDE) 37.5-25 MG capsule [Pharmacy Med Name: TRIAMTERENE-HCTZ 37.5-25 MG CP] 30 capsule 0    Sig: TAKE ONE CAPSULE BY MOUTH DAILY      Cardiovascular: Diuretic Combos Failed - 02/11/2019  2:44 PM      Failed - Cr in normal range and within 360 days    Creat  Date Value Ref Range Status  01/17/2019 1.23 (H) 0.60 - 0.93 mg/dL Final    Comment:    For patients >64 years of age, the reference limit for Creatinine is approximately 13% higher for people identified as African-American. .           Failed - Last BP in normal range    BP Readings from Last 1 Encounters:  07/17/18 (!) 148/80          Failed - Valid encounter within last 6 months    Recent Outpatient Visits           3 weeks ago Prediabetes   [facility] [facility], [place], DO   4 months ago Subacute sinusitis, unspecified location   [facility], Lanelle Bal, DO   6 months ago Primary insomnia   [place] [facility], Lanelle Bal, DO   1 year ago Needs flu shot   [facility], Lanelle Bal, DO   1 year ago Primary insomnia   [place] [facility], Lanelle Bal, DO       Future Appointments             In 1 week  [facility] At [place] in normal range and within 360 days    Potassium  Date Value Ref Range Status  01/17/2019 4.2 3.5 - 5.3 mmol/L Final          Passed - Na in normal range and within 360 days    Sodium  Date Value Ref Range Status  01/17/2019 142  135 - 146 mmol/L Final          Passed - Ca in normal range and within 360 days    Calcium  Date Value Ref Range Status  01/17/2019 9.4 8.6 - 10.4 mg/dL Final

## 2019-02-13 ENCOUNTER — Other Ambulatory Visit: Payer: Self-pay | Admitting: Osteopathic Medicine

## 2019-02-13 DIAGNOSIS — F321 Major depressive disorder, single episode, moderate: Secondary | ICD-10-CM

## 2019-02-13 NOTE — Telephone Encounter (Signed)
Requested medication (s) are due for refill today:yes  Requested medication (s) are on the active medication list: yes  Last refill:  12/1/202  Future visit scheduled:yes  Notes to clinic:  review medication for refill  Requested Prescriptions  Pending Prescriptions Disp Refills   mirtazapine (REMERON) 15 MG tablet [Pharmacy Med Name: MIRTAZAPINE 15 MG TABLET] 90 tablet 0    Sig: TAKE ONE-HALF TO [name] AT BEDTIME      Psychiatry: Antidepressants - mirtazapine Failed - 02/13/2019 11:21 AM      Failed - Valid encounter within last 6 months    Recent Outpatient Visits           4 weeks ago Prediabetes   [place] [facility], Lanelle Bal, DO   4 months ago Subacute sinusitis, unspecified location   [facility], Lanelle Bal, DO   7 months ago Primary insomnia   [place], Lanelle Bal, DO   1 year ago Needs flu shot   [place], Lanelle Bal, DO   1 year ago Primary insomnia   [place] [facility], Lanelle Bal, DO       Future Appointments             In 1 week [place] - AST in normal range and within 360 days    AST  Date Value Ref Range Status  01/17/2019 22 10 - 35 U/L Final          Passed - ALT in normal range and within 360 days    ALT  Date Value Ref Range Status  01/17/2019 21 6 - 29 U/L Final          Passed - Triglycerides in normal range and within 360 days    Triglycerides  Date Value Ref Range Status  01/17/2019 110 <150 mg/dL Final          Passed - Total Cholesterol in normal range and within 360 days    Cholesterol  Date Value Ref Range Status  01/17/2019 158 <200 mg/dL Final          Passed - WBC in normal range and within 360 days    WBC  Date Value Ref Range Status   01/17/2019 8.7 3.8 - 10.8 Thousand/uL Final          Passed - Completed PHQ-2 or PHQ-9 in the last 360 days.        triamterene-hydrochlorothiazide (DYAZIDE) 37.5-25 MG capsule [Pharmacy Med Name: TRIAMTERENE-HCTZ 37.5-25 MG CP] 30 capsule 0    Sig: TAKE ONE CAPSULE BY MOUTH DAILY      Cardiovascular: Diuretic Combos Failed - 02/13/2019 11:21 AM      Failed - Cr in normal range and within 360 days    Creat  Date Value Ref Range Status  01/17/2019 1.23 (H) 0.60 - 0.93 mg/dL Final    Comment:    For patients >11 years of age, the reference limit for Creatinine is approximately 13% higher for people identified as African-American. .           Failed - Last BP in normal range    BP Readings from Last 1 Encounters:  07/17/18 (!) 148/80          Failed - Valid encounter within  last 6 months    Recent Outpatient Visits           4 weeks ago Prediabetes   [place] [facility], [place], DO   4 months ago Subacute sinusitis, unspecified location   [facility], Lanelle Bal, DO   7 months ago Primary insomnia   [place] [facility], Lanelle Bal, DO   1 year ago Needs flu shot   [facility], Lanelle Bal, DO   1 year ago Primary insomnia   [place] [facility], Lanelle Bal, DO       Future Appointments             In 1 week  [facility] [place] in normal range and within 360 days    Potassium  Date Value Ref Range Status  01/17/2019 4.2 3.5 - 5.3 mmol/L Final          Passed - Na in normal range and within 360 days    Sodium  Date Value Ref Range Status  01/17/2019 142 135 - 146 mmol/L Final          Passed - Ca in normal range and within 360 days    Calcium  Date Value Ref Range Status  01/17/2019 9.4 8.6 - 10.4 mg/dL  Final

## 2019-02-17 DIAGNOSIS — H35033 Hypertensive retinopathy, bilateral: Secondary | ICD-10-CM | POA: Diagnosis not present

## 2019-02-17 DIAGNOSIS — H43392 Other vitreous opacities, left eye: Secondary | ICD-10-CM | POA: Diagnosis not present

## 2019-02-17 DIAGNOSIS — H43811 Vitreous degeneration, right eye: Secondary | ICD-10-CM | POA: Diagnosis not present

## 2019-02-17 DIAGNOSIS — H2513 Age-related nuclear cataract, bilateral: Secondary | ICD-10-CM | POA: Diagnosis not present

## 2019-02-17 NOTE — Progress Notes (Deleted)
Subjective:   [name] is a 74 y.o. female who presents for Medicare Annual (Subsequent) preventive examination.  Review of Systems:  No ROS.  Medicare Wellness Virtual Visit.  Visual/audio telehealth visit, UTA vital signs.   See social history for additional risk factors.      Sleep patterns:    Home Safety/Smoke Alarms: Feels safe in home. Smoke alarms in place.  Living environment;  Seat Belt Safety/Bike Helmet: Wears seat belt.   Female:   Pap- Aged out      Mammo-  UTD     Dexa scan- UTD       CCS- UTD     Objective:     Vitals: There were no vitals taken for this visit.  There is no height or weight on file to calculate BMI.  Advanced Directives 02/18/2018 09/20/2015 09/10/2015 08/04/2015  Does Patient Have a Medical Advance Directive? Yes Yes Yes Yes  Type of Paramedic of [place];Out of facility DNR (pink MOST or yellow form);Living will [place];Living will [place];Living will Out of facility DNR (pink MOST or yellow form);Living will;Healthcare Power of Attorney  Does patient want to make changes to medical advance directive? No - Patient declined - - No - Patient declined  Copy of [place] in Chart? No - copy requested Yes No - copy requested No - copy requested    Tobacco Social History   Tobacco Use  Smoking Status Never Smoker  Smokeless Tobacco Never Used     Counseling given: Not Answered   Clinical Intake:                       Past Medical History:  Diagnosis Date  . Anxiety   . Depression   . Headache    SINUS  . Hyperlipidemia   . Hypertension   . Osteopenia   . Primary localized osteoarthritis of right knee    Past Surgical History:  Procedure Laterality Date  . BREAST LUMPECTOMY Right 1997   Done in [place]  . CARPAL TUNNEL RELEASE Right 2015   Ortho in [place]  . LASIK    . MENISCUS REPAIR Right 2011   Orthopedic in [place]   . TOTAL KNEE ARTHROPLASTY Right 09/20/2015  . TOTAL KNEE ARTHROPLASTY Right 09/20/2015   Procedure: TOTAL KNEE ARTHROPLASTY;  Surgeon: Elsie Saas, MD;  Location: [facility];  Service: Orthopedics;  Laterality: Right;  . Trigger Thumb Right 2015   Done in [facility] at the same time as the carpal tunnel release  . TUBAL LIGATION  1980   Family History  Problem Relation Age of Onset  . Heart failure Mother   . Heart attack Mother   . Alcohol abuse Maternal Aunt   . Heart attack Father   . Irregular heart beat Sister   . Hypertension Sister   . Heart disease Brother    Social History   Socioeconomic History  . Marital status: Married    Spouse name: Purcell Nails  . Number of children: 1  . Years of education: 17.5  . Highest education level: Master's degree (e.g., MA, MS, MEng, MEd, MSW, MBA)  Occupational History  . Occupation: retired    Comment: Licensed conveyancer  Tobacco Use  . Smoking status: Never Smoker  . Smokeless tobacco: Never Used  Substance and Sexual Activity  . Alcohol use: No    Comment: 1 glass a month  . Drug use: No  . Sexual  activity: Yes  Other Topics Concern  . Not on file  Social History Narrative   Reads paper, walks, housework errands. Caffeine use daily   Social Determinants of Health   Financial Resource Strain: Low Risk   . Difficulty of Paying Living Expenses: Not hard at all  Food Insecurity: No Food Insecurity  . Worried About Charity fundraiser in the Last Year: Never true  . Ran Out of Food in the Last Year: Never true  Transportation Needs: No Transportation Needs  . Lack of Transportation (Medical): No  . Lack of Transportation (Non-Medical): No  Physical Activity: Insufficiently Active  . Days of Exercise per Week: 3 days  . Minutes of Exercise per Session: 30 min  Stress: No Stress Concern Present  . Feeling of Stress : Not at all  Social Connections: Slightly Isolated  . Frequency of Communication with Friends and Family: More than three  times a week  . Frequency of Social Gatherings with Friends and Family: Twice a week  . Attends Religious Services: Never  . Active Member of Clubs or Organizations: Yes  . Attends Archivist Meetings: More than 4 times per year  . Marital Status: Married    Outpatient Encounter Medications as of 02/24/2019  Medication Sig  . triamterene-hydrochlorothiazide (DYAZIDE) 37.5-25 MG capsule TAKE ONE CAPSULE BY MOUTH DAILY  . amLODipine-benazepril (LOTREL) 5-20 MG capsule TAKE ONE CAPSULE BY MOUTH DAILY  . atorvastatin (LIPITOR) 20 MG tablet TAKE ONE TABLET BY MOUTH DAILY. *NEEDS LAB WORK*  . clonazePAM (KLONOPIN) 0.5 MG tablet TAKE ONE-HALF TO ONE TABLET BY MOUTH TWO TIMES A DAY AS NEEDED FOR ANXIETY. DO NOT TAKE WITH TEMAZEPAM OR AMBIEN  . fluticasone (FLONASE) 50 MCG/ACT nasal spray Place into both nostrils daily.  . meloxicam (MOBIC) 7.5 MG tablet TAKE 1 TO 2 TABLETS BY MOUTH DAILY AS DIRECTED FOR PAIN  . metFORMIN (GLUCOPHAGE-XR) 500 MG 24 hr tablet Take 1 tablet (500 mg total) by mouth daily with breakfast.  . raloxifene (EVISTA) 60 MG tablet TAKE ONE TABLET BY MOUTH DAILY  . temazepam (RESTORIL) 30 MG capsule Take 1 capsule (30 mg total) by mouth at bedtime as needed for sleep.  Marland Kitchen venlafaxine XR (EFFEXOR-XR) 150 MG 24 hr capsule TAKE ONE CAPSULE BY MOUTH EVERY MORNING WITH BREAKFAST  . zolpidem (AMBIEN) 10 MG tablet Take 1 tablet (10 mg total) by mouth at bedtime as needed for sleep.   No facility-administered encounter medications on file as of 02/24/2019.    Activities of Daily Living In your present state of health, do you have any difficulty performing the following activities: 02/18/2018  Hearing? N  Comment wearing hearing aids daily  Vision? N  Difficulty concentrating or making decisions? N  Walking or climbing stairs? N  Dressing or bathing? N  Doing errands, shopping? N  Preparing Food and eating ? N  Using the Toilet? N  In the past six months, have you  accidently leaked urine? N  Do you have problems with loss of bowel control? N  Managing your Medications? N  Managing your Finances? N  Housekeeping or managing your Housekeeping? N  Some recent data might be hidden    Patient Care Team: Emeterio Reeve, DO as PCP - General (Osteopathic Medicine)    Assessment:   This is a routine wellness examination for [place].Physical assessment deferred to PCP.   Exercise Activities and Dietary recommendations   Diet  Breakfast: Lunch:  Dinner:       Goals    .  Weight (lb) < 200 lb (90.7 kg)     Wants to loose around 20-25 lbs. Is a member of weight watchers       Fall Risk Fall Risk  02/18/2018 01/02/2018 01/18/2017 12/28/2016 12/07/2015  Falls in the past year? 0 0 No No No  Comment - Emmi Telephone Survey: data to providers prior to load Emmi Telephone Survey: data to providers prior to load - -  Follow up Falls evaluation completed - - - -   Is the patient's home free of loose throw rugs in walkways, pet beds, electrical cords, etc?   {Blank single:19197::"yes","no"}      Grab bars in the bathroom? {Blank single:19197::"yes","no"}      Handrails on the stairs?   {Blank single:19197::"yes","no"}      Adequate lighting?   {Blank single:19197::"yes","no"}   Depression Screen PHQ 2/9 Scores 02/18/2018 09/17/2017 12/28/2016 02/15/2016  PHQ - 2 Score 0 1 2 3   PHQ- 9 Score - 4 5 12   Some encounter information is confidential and restricted. Go to Review Flowsheets activity to see all data.     Cognitive Function     6CIT Screen 02/18/2018  What Year? 0 points  What month? 0 points  What time? 0 points  Count back from 20 2 points  Months in reverse 0 points  Repeat phrase 0 points  Total Score 2    Immunization History  Administered Date(s) Administered  . Fluad Quad(high Dose 65+) 10/30/2018  . Influenza, High Dose Seasonal PF 12/14/2016, 11/28/2017  . Influenza,inj,Quad PF,6+ Mos 12/07/2015  . Pneumococcal Conjugate-13  07/01/2014  . Pneumococcal Polysaccharide-23 07/07/2010  . Tdap 08/18/2008, 12/05/2011  . Zoster 08/22/2010  . Zoster Recombinat (Shingrix) 09/22/2017, 03/11/2018    Screening Tests Health Maintenance  Topic Date Due  . Hepatitis C Screening  Jul 19, 1945  . MAMMOGRAM  06/14/2019  . Fecal DNA (Cologuard)  07/10/2019  . TETANUS/TDAP  12/04/2021  . INFLUENZA VACCINE  Completed  . DEXA SCAN  Completed  . PNA vac Low Risk Adult  Completed       Plan:   ***   I have personally reviewed and noted the following in the patient's chart:   . Medical and social history . Use of alcohol, tobacco or illicit drugs  . Current medications and supplements . Functional ability and status . Nutritional status . Physical activity . Advanced directives . List of other physicians . Hospitalizations, surgeries, and ER visits in previous 12 months . Vitals . Screenings to include cognitive, depression, and falls . Referrals and appointments  In addition, I have reviewed and discussed with patient certain preventive protocols, quality metrics, and best practice recommendations. A written personalized care plan for preventive services as well as general preventive health recommendations were provided to patient.     Joanne Chars, LPN  [id number]

## 2019-02-19 ENCOUNTER — Other Ambulatory Visit: Payer: Self-pay | Admitting: Osteopathic Medicine

## 2019-02-19 DIAGNOSIS — F418 Other specified anxiety disorders: Secondary | ICD-10-CM

## 2019-02-19 NOTE — Telephone Encounter (Signed)
Requested medication (s) are due for refill today: yes  Requested medication (s) are on the active medication list: yes  Last refill:  11/18/2018  Future visit scheduled: yes  Notes to clinic:  review for refill   Requested Prescriptions  Pending Prescriptions Disp Refills   atorvastatin (LIPITOR) 20 MG tablet [Pharmacy Med Name: ATORVASTATIN 20 MG TABLET] 90 tablet 0    Sig: TAKE ONE TABLET BY MOUTH DAILY      Cardiovascular:  Antilipid - Statins Failed - 02/19/2019  4:01 AM      Failed - Valid encounter within last 12 months    Recent Outpatient Visits           1 month ago Prediabetes   [place] [facility], Lanelle Bal, DO   4 months ago Subacute sinusitis, unspecified location   [place], Lanelle Bal, DO   7 months ago Primary insomnia   [place], Lanelle Bal, DO   1 year ago Needs flu shot   [place], Lanelle Bal, DO   1 year ago Primary insomnia   [place] [facility], Lanelle Bal, DO       Future Appointments             In 5 days [place] [facility] - Total Cholesterol in normal range and within 360 days    Cholesterol  Date Value Ref Range Status  01/17/2019 158 <200 mg/dL Final          Passed - LDL in normal range and within 360 days    LDL Cholesterol (Calc)  Date Value Ref Range Status  01/17/2019 81 mg/dL (calc) Final    Comment:    Reference range: <100 . Desirable range <100 mg/dL for primary prevention;   <70 mg/dL for patients with CHD or diabetic patients  with > or = 2 CHD risk factors. Marland Kitchen LDL-C is now calculated using the Martin-Hopkins  calculation, which is a validated novel method providing  better accuracy than the Friedewald equation in the  estimation of LDL-C.  Cresenciano Genre et al. Annamaria Helling.  [id number]): [id number]  (http://education.[employer].com/faq/FAQ164)           Passed - HDL in normal range and within 360 days    HDL  Date Value Ref Range Status  01/17/2019 56 > OR = 50 mg/dL Final          Passed - Triglycerides in normal range and within 360 days    Triglycerides  Date Value Ref Range Status  01/17/2019 110 <150 mg/dL Final          Passed - Patient is not pregnant        clonazePAM (KLONOPIN) 0.5 MG tablet [Pharmacy Med Name: clonazePAM 0.5 MG TABLET] 60 tablet 0    Sig: TAKE ONE-HALF TO ONE TABLET BY MOUTH TWO TIMES A DAY AS NEEDED FOR ANXIETY. DO NOT TAKE WITH TEMAZEPAM OR AMBIEN      Not Delegated - Psychiatry:  Anxiolytics/Hypnotics Failed - 02/19/2019  4:01 AM      Failed - This refill cannot be delegated      Failed - Urine Drug Screen completed in last 360 days.      Failed - Valid encounter within last 6 months    Recent Outpatient Visits  1 month ago Prediabetes   [place] [facility], [place], DO   4 months ago Subacute sinusitis, unspecified location   [facility], Lanelle Bal, DO   7 months ago Primary insomnia    [facility], Lanelle Bal, DO   1 year ago Needs flu shot   [facility], Lanelle Bal, DO   1 year ago Primary insomnia    [facility] [place], Lanelle Bal, DO       Future Appointments             In 5 days [facility]

## 2019-02-24 ENCOUNTER — Ambulatory Visit: Payer: Self-pay

## 2019-03-07 ENCOUNTER — Other Ambulatory Visit: Payer: Self-pay | Admitting: Osteopathic Medicine

## 2019-03-11 ENCOUNTER — Other Ambulatory Visit: Payer: Self-pay | Admitting: Osteopathic Medicine

## 2019-03-11 DIAGNOSIS — I10 Essential (primary) hypertension: Secondary | ICD-10-CM

## 2019-03-11 NOTE — Telephone Encounter (Signed)
Requested medication (s) are due for refill today: yes  Requested medication (s) are on the active medication list: yes  Last refill:  01/15/2019  Future visit scheduled: no  Notes to clinic:  review for refill   Requested Prescriptions  Pending Prescriptions Disp Refills   amLODipine-benazepril (LOTREL) 5-20 MG capsule [Pharmacy Med Name: amLODIPine-BENAZEPRIL 5-20 MG CAP] 90 capsule 0    Sig: TAKE ONE CAPSULE BY MOUTH DAILY      Cardiovascular: CCB + ACEI Combos Failed - 03/11/2019 12:20 PM      Failed - Cr in normal range and within 180 days    Creat  Date Value Ref Range Status  01/17/2019 1.23 (H) 0.60 - 0.93 mg/dL Final    Comment:    For patients >61 years of age, the reference limit for Creatinine is approximately 13% higher for people identified as African-American. .           Failed - Last BP in normal range    BP Readings from Last 1 Encounters:  07/17/18 (!) 148/80          Failed - Valid encounter within last 6 months    Recent Outpatient Visits           1 month ago Prediabetes   [place] [facility], [place], DO   5 months ago Subacute sinusitis, unspecified location   [facility], Lanelle Bal, DO   7 months ago Primary insomnia   [place] [facility] [place], Lanelle Bal, DO   1 year ago Needs flu shot   [facility], Lanelle Bal, DO   1 year ago Primary insomnia   [place] [facility], Lanelle Bal, DO              Passed - K in normal range and within 180 days    Potassium  Date Value Ref Range Status  01/17/2019 4.2 3.5 - 5.3 mmol/L Final          Passed - Patient is not pregnant

## 2019-03-16 ENCOUNTER — Encounter: Payer: Self-pay | Admitting: Osteopathic Medicine

## 2019-03-16 DIAGNOSIS — F321 Major depressive disorder, single episode, moderate: Secondary | ICD-10-CM

## 2019-03-17 MED ORDER — MIRTAZAPINE 15 MG PO TABS: ORAL_TABLET | ORAL | 1 refills | Status: DC

## 2019-03-19 ENCOUNTER — Other Ambulatory Visit: Payer: Self-pay | Admitting: Physician Assistant

## 2019-03-19 DIAGNOSIS — F418 Other specified anxiety disorders: Secondary | ICD-10-CM

## 2019-04-08 ENCOUNTER — Encounter: Payer: Self-pay | Admitting: Orthopaedic Surgery

## 2019-04-08 ENCOUNTER — Ambulatory Visit (INDEPENDENT_AMBULATORY_CARE_PROVIDER_SITE_OTHER): Payer: Medicare Other | Admitting: Orthopaedic Surgery

## 2019-04-08 ENCOUNTER — Other Ambulatory Visit: Payer: Self-pay

## 2019-04-08 DIAGNOSIS — M65321 Trigger finger, right index finger: Secondary | ICD-10-CM | POA: Diagnosis not present

## 2019-04-08 DIAGNOSIS — M65342 Trigger finger, left ring finger: Secondary | ICD-10-CM | POA: Diagnosis not present

## 2019-04-08 MED ORDER — LIDOCAINE HCL 1 % IJ SOLN
1.0000 mL | INTRAMUSCULAR | Status: AC | PRN
  Administered 2019-04-08: 13:00:00 1 mL

## 2019-04-08 MED ORDER — BUPIVACAINE HCL 0.25 % IJ SOLN
0.3300 mL | INTRAMUSCULAR | Status: AC | PRN
  Administered 2019-04-08: 13:00:00 .33 mL

## 2019-04-08 MED ORDER — METHYLPREDNISOLONE ACETATE 40 MG/ML IJ SUSP
13.3300 mg | INTRAMUSCULAR | Status: AC | PRN
  Administered 2019-04-08: 13:00:00 13.33 mg

## 2019-04-08 NOTE — Progress Notes (Signed)
Office Visit Note   Patient: [name]           Date of Birth: Nov 25, 1945           MRN: [id number] Visit Date: 04/08/2019              Requested by: Emeterio Reeve, [place],  [place] [id number] PCP: Emeterio Reeve, DO   Assessment & Plan: Visit Diagnoses:  1. Trigger finger, left ring finger   2. Trigger index finger of right hand     Plan: Impression is left ring and right index trigger fingers.  In regards to the left ring finger, we will inject this with cortisone today.  In regards to the right index finger, she is already had 2 of these injections but is not quite ready to proceed with surgical intervention.  She thinks she will be ready in the next 1 to 2 months.  She will follow-up with [place] at that point for surgical scheduling.  Follow-Up Instructions: Return if symptoms worsen or fail to improve.   Orders:  Orders Placed This Encounter  Procedures  . Hand/UE Inj: L ring A1   No orders of the defined types were placed in this encounter.     Procedures: Hand/UE Inj: L ring A1 for trigger finger on 04/08/2019 1:24 PM Indications: pain Details: 25 G needle Medications: 1 mL lidocaine 1 %; 0.33 mL bupivacaine 0.25 %; 13.33 mg methylPREDNISolone acetate 40 MG/ML      Clinical Data: No additional findings.   Subjective: Chief Complaint  Patient presents with  . Left Hand - Pain    HPI patient is a pleasant 74 year old female who comes in today with recurrent right index trigger finger and new onset left ring trigger finger.  In regards to the right index finger, she has had this injected in February 2020 and then again in July 2020.  Great relief of symptoms until recently.  Her triggering has started to return.  She is aware that we are only able to proceed with 2 trigger finger injections before recommending surgical intervention.  She is not quite ready for that at this point.  In regards to the left ring finger, she  recently noticed pain and triggering there.  No previous cortisone injection.  Of note, she is not diabetic.  Review of Systems as detailed in HPI.  All others reviewed and are negative.   Objective: Vital Signs: There were no vitals taken for this visit.  Physical Exam well-developed and well-nourished female in no acute distress.  Alert and oriented x3.  Ortho Exam examination of the knee right index and left ring fingers show tender palpable nodules A1 pulley.  She does have active triggering.  Fingers are warm and well-perfused.  She is neurovascular intact distally.  Specialty Comments:  No specialty comments available.  Imaging: No new imaging   PMFS History: Patient Active Problem List   Diagnosis Date Noted  . Post-menopausal 05/14/2017  . Prediabetes 01/12/2017  . Snoring 07/20/2016  . Obsessive-compulsive personality trait 12/07/2015  . Morbid obesity ([place]) 09/19/2015  . Primary localized osteoarthritis of right knee   . Cardiac risk counseling 08/16/2015  . Essential hypertension 08/04/2015  . Hyperlipidemia 08/04/2015  . Osteopenia 08/04/2015  . Anxiety associated with depression 08/04/2015  . Right knee pain 06/14/2015  . Hyposomnia, insomnia or sleeplessness associated with anxiety 06/14/2015   Past Medical History:  Diagnosis Date  . Anxiety   . Depression   .  Headache    SINUS  . Hyperlipidemia   . Hypertension   . Osteopenia   . Primary localized osteoarthritis of right knee     Family History  Problem Relation Age of Onset  . Heart failure Mother   . Heart attack Mother   . Alcohol abuse Maternal Aunt   . Heart attack Father   . Irregular heart beat Sister   . Hypertension Sister   . Heart disease Brother     Past Surgical History:  Procedure Laterality Date  . BREAST LUMPECTOMY Right 1997   Done in [place]  . CARPAL TUNNEL RELEASE Right 2015   Ortho in [place]  . LASIK    . MENISCUS REPAIR Right 2011   Orthopedic in [place]  .  TOTAL KNEE ARTHROPLASTY Right 09/20/2015  . TOTAL KNEE ARTHROPLASTY Right 09/20/2015   Procedure: TOTAL KNEE ARTHROPLASTY;  Surgeon: Elsie Saas, MD;  Location: [place];  Service: Orthopedics;  Laterality: Right;  . Trigger Thumb Right 2015   Done in [facility] at the same time as the carpal tunnel release  . TUBAL LIGATION  1980   Social History   Occupational History  . Occupation: retired    Comment: Licensed conveyancer  Tobacco Use  . Smoking status: Never Smoker  . Smokeless tobacco: Never Used  Substance and Sexual Activity  . Alcohol use: No    Comment: 1 glass a month  . Drug use: No  . Sexual activity: Yes

## 2019-04-18 ENCOUNTER — Other Ambulatory Visit: Payer: Self-pay | Admitting: Osteopathic Medicine

## 2019-04-18 DIAGNOSIS — F418 Other specified anxiety disorders: Secondary | ICD-10-CM

## 2019-04-18 NOTE — Telephone Encounter (Signed)
[name] is requesting med refill for clonazepam.

## 2019-05-02 ENCOUNTER — Other Ambulatory Visit: Payer: Self-pay | Admitting: Osteopathic Medicine

## 2019-05-02 DIAGNOSIS — Z1231 Encounter for screening mammogram for malignant neoplasm of breast: Secondary | ICD-10-CM

## 2019-05-07 ENCOUNTER — Encounter: Payer: Self-pay | Admitting: Osteopathic Medicine

## 2019-05-07 DIAGNOSIS — F5101 Primary insomnia: Secondary | ICD-10-CM

## 2019-05-07 MED ORDER — TEMAZEPAM 30 MG PO CAPS: 30.0000 mg | ORAL_CAPSULE | Freq: Every evening | ORAL | 2 refills | Status: DC | PRN

## 2019-05-16 ENCOUNTER — Other Ambulatory Visit: Payer: Self-pay | Admitting: Physician Assistant

## 2019-05-16 ENCOUNTER — Other Ambulatory Visit: Payer: Self-pay | Admitting: Osteopathic Medicine

## 2019-05-16 DIAGNOSIS — F418 Other specified anxiety disorders: Secondary | ICD-10-CM

## 2019-05-19 ENCOUNTER — Other Ambulatory Visit: Payer: Self-pay

## 2019-05-19 DIAGNOSIS — F418 Other specified anxiety disorders: Secondary | ICD-10-CM

## 2019-05-19 MED ORDER — VENLAFAXINE HCL ER 150 MG PO CP24: 150.0000 mg | ORAL_CAPSULE | Freq: Every day | ORAL | 1 refills | Status: DC

## 2019-05-19 NOTE — Telephone Encounter (Signed)
[place] requesting med refill for clonazepam. Rx pended.

## 2019-05-20 ENCOUNTER — Ambulatory Visit (INDEPENDENT_AMBULATORY_CARE_PROVIDER_SITE_OTHER): Payer: Medicare Other | Admitting: Osteopathic Medicine

## 2019-05-20 ENCOUNTER — Other Ambulatory Visit: Payer: Self-pay

## 2019-05-20 ENCOUNTER — Encounter: Payer: Self-pay | Admitting: Osteopathic Medicine

## 2019-05-20 VITALS — BP 171/81 | HR 96 | Ht 62.0 in | Wt 212.0 lb

## 2019-05-20 DIAGNOSIS — I10 Essential (primary) hypertension: Secondary | ICD-10-CM | POA: Diagnosis not present

## 2019-05-20 DIAGNOSIS — R7303 Prediabetes: Secondary | ICD-10-CM

## 2019-05-20 DIAGNOSIS — E8881 Metabolic syndrome: Secondary | ICD-10-CM | POA: Diagnosis not present

## 2019-05-20 DIAGNOSIS — M545 Low back pain, unspecified: Secondary | ICD-10-CM

## 2019-05-20 DIAGNOSIS — M25569 Pain in unspecified knee: Secondary | ICD-10-CM

## 2019-05-20 DIAGNOSIS — Z13820 Encounter for screening for osteoporosis: Secondary | ICD-10-CM

## 2019-05-20 DIAGNOSIS — E669 Obesity, unspecified: Secondary | ICD-10-CM

## 2019-05-20 MED ORDER — CLONAZEPAM 0.5 MG PO TABS: ORAL_TABLET | ORAL | 0 refills | Status: DC

## 2019-05-20 MED ORDER — OZEMPIC (0.25 OR 0.5 MG/DOSE) 2 MG/1.5ML ~~LOC~~ SOPN: PEN_INJECTOR | SUBCUTANEOUS | 3 refills | Status: DC

## 2019-05-20 NOTE — Patient Instructions (Addendum)
If back/knee pain is not better or if it gets worse, I would recommend follow-up with one of sports medicine specialist (Dr. Patton Salles Dr. Darene Lamer) for further evaluation in 4 weeks. Just call our office and ask for an appointment for sports medicine!   In the meantime, let's try physical therapy, let me know if medications needed.   Will work on weight loss. Let's try to get Ozempic covered for you.   Watch blood pressure! Goal <140/90.

## 2019-05-20 NOTE — Addendum Note (Signed)
Addended by: Maryla Morrow on: 05/20/2019 04:17 PM   Modules accepted: Orders

## 2019-05-20 NOTE — Progress Notes (Signed)
[name] is a 74 y.o. female who presents to  [place] at [facility]  today, 05/20/19, seeking care for the following: Chief Complaint  Patient presents with  . Back Pain    back and bilateral knee pain when walking around the block  Would like to hold off on pain Rx Working on weight loss, remeron works well for mental health but increases appetite         ASSESSMENT & PLAN with other pertinent history/findings:  The primary encounter diagnosis was Low back pain, unspecified back pain laterality, unspecified chronicity, unspecified whether sciatica present. Diagnoses of Knee pain, unspecified chronicity, unspecified laterality, Abdominal obesity and metabolic syndrome, Essential hypertension, and Prediabetes were also pertinent to this visit.    Patient Instructions  If back/knee pain is not better or if it gets worse, I would recommend follow-up with one of sports medicine specialist (Dr. Patton Salles Dr. Darene Lamer) for further evaluation in 4 weeks. Just call our office and ask for an appointment for sports medicine!   In the meantime, let's try physical therapy, let me know if medications needed.   Will work on weight loss. Let's try to get Ozempic covered for you.   Watch blood pressure! Goal <140/90.       No orders of the defined types were placed in this encounter.   Meds ordered this encounter  Medications  . Semaglutide,0.25 or 0.5MG /DOS, (OZEMPIC, 0.25 OR 0.5 MG/DOSE,) 2 MG/1.5ML SOPN    Sig: Inject 0.25 mg into the skin once a week for 28 days, THEN 0.5 mg once a week.    Dispense:  2 pen    Refill:  3       Follow-up instructions: Return if symptoms worsen or fail to improve, for VISIT WITH SPORTS MEDICINE FOR ORTHOPEDIC ISSUE.                                         BP (!) 171/81   Pulse 96   Ht 5\' 2"  (1.575 m)   Wt 212 lb (96.2 kg)   SpO2 99%   BMI 38.78 kg/m    Current Meds  Medication Sig  . amLODipine-benazepril (LOTREL) 5-20 MG capsule TAKE ONE CAPSULE BY MOUTH DAILY  . atorvastatin (LIPITOR) 20 MG tablet Take 1 tablet (20 mg total) by mouth daily. Needs appt and labs.  . clonazePAM (KLONOPIN) 0.5 MG tablet TAKE 1/2 TO 1 TABLET BY MOUTH TWO TIMES A DAY AS NEEDED FOR ANXIETY. DO NOT TAKE WITH TEMAZEPAM OR AMBIEN.  . fluticasone (FLONASE) 50 MCG/ACT nasal spray Place into both nostrils daily.  . mirtazapine (REMERON) 15 MG tablet TAKE ONE-HALF TO ONE TABLET BY MOUTH AT BEDTIME  . raloxifene (EVISTA) 60 MG tablet TAKE ONE TABLET BY MOUTH DAILY  . temazepam (RESTORIL) 30 MG capsule Take 1 capsule (30 mg total) by mouth at bedtime as needed for sleep.  Marland Kitchen triamterene-hydrochlorothiazide (DYAZIDE) 37.5-25 MG capsule TAKE ONE CAPSULE BY MOUTH DAILY  . venlafaxine XR (EFFEXOR-XR) 150 MG 24 hr capsule Take 1 capsule (150 mg total) by mouth daily with breakfast.    No results found for this or any previous visit (from the past 72 hour(s)).  No results found.  Depression screen [facility] 2/9 05/20/2019 02/18/2018 09/17/2017  Decreased Interest 0 0 0  Down, Depressed, Hopeless 0 0 1  PHQ - 2 Score 0  0 1  Altered sleeping - - 1  Tired, decreased energy - - 1  Change in appetite - - 0  Feeling bad or failure about yourself  - - 1  Trouble concentrating - - 0  Moving slowly or fidgety/restless - - 0  Suicidal thoughts - - 0  PHQ-9 Score - - 4  Difficult doing work/chores - - Not difficult at all  Some encounter information is confidential and restricted. Go to Review Flowsheets activity to see all data.    GAD 7 : Generalized Anxiety Score 09/17/2017 02/15/2016 01/18/2016  Nervous, Anxious, on Edge 1 2 3   Control/stop worrying 0 2 3  Worry too much - different things 0 2 3  Trouble relaxing 1 2 3   Restless 0 1 2  Easily annoyed or irritable 0 1 2  Afraid - awful might happen 0 1 3  Total GAD 7 Score 2 11 19   Anxiety Difficulty Not difficult at all Somewhat  difficult Very difficult  Some encounter information is confidential and restricted. Go to Review Flowsheets activity to see all data.      All questions at time of visit were answered - patient instructed to contact office with any additional concerns or updates.  ER/RTC precautions were reviewed with the patient.  Please note: voice recognition software was used to produce this document, and typos may escape review. Please contact Dr. Sheppard Coil for any needed clarifications.

## 2019-05-28 ENCOUNTER — Ambulatory Visit
Admission: RE | Admit: 2019-05-28 | Discharge: 2019-05-28 | Disposition: A | Discharge status: Home / Self Care | Payer: Medicare Other | Source: Ambulatory Visit | Attending: Osteopathic Medicine | Admitting: Osteopathic Medicine

## 2019-05-28 ENCOUNTER — Other Ambulatory Visit: Payer: Self-pay

## 2019-05-28 DIAGNOSIS — Z1231 Encounter for screening mammogram for malignant neoplasm of breast: Secondary | ICD-10-CM | POA: Diagnosis not present

## 2019-06-05 ENCOUNTER — Encounter: Payer: Self-pay | Admitting: Osteopathic Medicine

## 2019-06-05 DIAGNOSIS — G8929 Other chronic pain: Secondary | ICD-10-CM

## 2019-06-05 DIAGNOSIS — M79606 Pain in leg, unspecified: Secondary | ICD-10-CM

## 2019-06-05 NOTE — Telephone Encounter (Signed)
Routing to provider  

## 2019-06-17 ENCOUNTER — Other Ambulatory Visit: Payer: Self-pay | Admitting: Osteopathic Medicine

## 2019-06-17 DIAGNOSIS — F418 Other specified anxiety disorders: Secondary | ICD-10-CM

## 2019-06-18 ENCOUNTER — Other Ambulatory Visit: Payer: Self-pay | Admitting: Physician Assistant

## 2019-06-30 ENCOUNTER — Ambulatory Visit (INDEPENDENT_AMBULATORY_CARE_PROVIDER_SITE_OTHER): Payer: Medicare Other | Admitting: Osteopathic Medicine

## 2019-06-30 ENCOUNTER — Other Ambulatory Visit: Payer: Self-pay

## 2019-06-30 ENCOUNTER — Encounter: Payer: Self-pay | Admitting: Osteopathic Medicine

## 2019-06-30 VITALS — BP 136/75 | HR 89 | Temp 98.1°F | Wt 206.1 lb

## 2019-06-30 DIAGNOSIS — F5101 Primary insomnia: Secondary | ICD-10-CM | POA: Diagnosis not present

## 2019-06-30 DIAGNOSIS — I10 Essential (primary) hypertension: Secondary | ICD-10-CM | POA: Diagnosis not present

## 2019-06-30 DIAGNOSIS — R7303 Prediabetes: Secondary | ICD-10-CM | POA: Diagnosis not present

## 2019-06-30 DIAGNOSIS — E8881 Metabolic syndrome: Secondary | ICD-10-CM | POA: Diagnosis not present

## 2019-06-30 DIAGNOSIS — E669 Obesity, unspecified: Secondary | ICD-10-CM

## 2019-06-30 LAB — LIPID PANEL
Cholesterol: 157 mg/dL (ref <200)
HDL: 56 mg/dL (ref >=50)
LDL Cholesterol (Calc): 75 mg/dL (calc)
Non-HDL Cholesterol (Calc): 101 mg/dL (calc) (ref <130)
Total CHOL/HDL Ratio: 2.8 (calc) (ref <5.0)
Triglycerides: 161 mg/dL — ABNORMAL HIGH (ref <150)

## 2019-06-30 LAB — POCT GLYCOSYLATED HEMOGLOBIN (HGB A1C): Hemoglobin A1C: 6.3 % — AB (ref 4.0–5.6)

## 2019-06-30 LAB — COMPLETE METABOLIC PANEL WITH GFR
AG Ratio: 1.7 (calc) (ref 1.0–2.5)
ALT: 19 U/L (ref 6–29)
AST: 20 U/L (ref 10–35)
Albumin: 4.5 g/dL (ref 3.6–5.1)
Alkaline phosphatase (APISO): 110 U/L (ref 37–153)
BUN/Creatinine Ratio: 21 (calc) (ref 6–22)
BUN: 26 mg/dL — ABNORMAL HIGH (ref 7–25)
CO2: 21 mmol/L (ref 20–32)
Calcium: 9.5 mg/dL (ref 8.6–10.4)
Chloride: 101 mmol/L (ref 98–110)
Creat: 1.25 mg/dL — ABNORMAL HIGH (ref 0.60–0.93)
GFR, Est African American: 49 mL/min/{1.73_m2} — ABNORMAL LOW (ref >=60)
GFR, Est Non African American: 42 mL/min/{1.73_m2} — ABNORMAL LOW (ref >=60)
Globulin: 2.7 g/dL (calc) (ref 1.9–3.7)
Glucose, Bld: 117 mg/dL — ABNORMAL HIGH (ref 65–99)
Potassium: 3.8 mmol/L (ref 3.5–5.3)
Sodium: 140 mmol/L (ref 135–146)
Total Bilirubin: 0.4 mg/dL (ref 0.2–1.2)
Total Protein: 7.2 g/dL (ref 6.1–8.1)

## 2019-06-30 MED ORDER — ZOLPIDEM TARTRATE ER 12.5 MG PO TBCR: 12.5000 mg | EXTENDED_RELEASE_TABLET | Freq: Every evening | ORAL | 0 refills | Status: DC | PRN

## 2019-06-30 NOTE — Progress Notes (Signed)
[name] is a 74 y.o. female who presents to  [place] at [facility]  today, 06/30/19, seeking care for the following: . Follow-up prediabetes, insomnia, weight - exercise is a challenge given leg/hip pain. Weight better. Insomnia still an issue - longstanding, see previous notes. Alternating between Temazepam and Ambien. Very bad few days of insomnia last week.  . Ozempic causing nausea since she went up from 0.25 to 0.5 mg, has had 3 doses, more mild/shorter duration past dose.    BP Readings from Last 3 Encounters:  06/30/19 136/75  05/20/19 (!) 171/81  07/17/18 (!) 148/80   Wt Readings from Last 3 Encounters:  06/30/19 206 lb 1.9 oz (93.5 kg)  05/20/19 212 lb (96.2 kg)  01/16/19 212 lb (96.2 kg)   Labs stable 01/2019: Lipids, CBC, CMP see chart      ASSESSMENT & PLAN with other pertinent history/findings:  The primary encounter diagnosis was Prediabetes. Diagnoses of Essential hypertension, Primary insomnia, and Abdominal obesity and metabolic syndrome were also pertinent to this visit.  Routine labs Will trial Ambien CR for severe symptoms    Patient Instructions   A1C looking good Today 6.3 Last check   Weight trending downward:  Wt Readings from Last 3 Encounters:  06/30/19 206 lb 1.9 oz (93.5 kg)  05/20/19 212 lb (96.2 kg)  01/16/19 212 lb (96.2 kg)   Blood pressure looking better!  BP Readings from Last 3 Encounters:  06/30/19 136/75  05/20/19 (!) 171/81  07/17/18 (!) 148/80   Will keep on the Ozmepic for now - let me know if still causing nausea problems  Will try Ambien Cr 12.5 mg as needed for severe insomnia - let mw know how it works!        Orders Placed This Encounter  Procedures  . COMPLETE METABOLIC PANEL WITH GFR  . Lipid panel  . POCT HgB A1C    Meds ordered this encounter  Medications  . zolpidem (AMBIEN CR) 12.5 MG CR tablet    Sig: Take 1 tablet (12.5 mg total) by mouth at  bedtime as needed (severe insomnia).    Dispense:  30 tablet    Refill:  0       Follow-up instructions: Return in about 6 months (around 12/31/2019) for ANNUAL (call week prior to visit for lab orders).                                         BP 136/75 (BP Location: Left Arm, Patient Position: Sitting, Cuff Size: Large)   Pulse 89   Temp 98.1 F (36.7 C) (Oral)   Wt 206 lb 1.9 oz (93.5 kg)   BMI 37.70 kg/m   Current Meds  Medication Sig  . amLODipine-benazepril (LOTREL) 5-20 MG capsule TAKE ONE CAPSULE BY MOUTH DAILY  . atorvastatin (LIPITOR) 20 MG tablet Take 1 tablet (20 mg total) by mouth daily. **PATIENT NEEDS OFFICE VISIT FOR ADDITIONAL REFILLS**  . clonazePAM (KLONOPIN) 0.5 MG tablet TAKE 1/2 TO 1 TABLET BY MOUTH TWO TIMES A DAY AS NEEDED FOR ANXIETY. DO NOT TAKE WITH TEMAZEPAM OR AMBIEN  . fluticasone (FLONASE) 50 MCG/ACT nasal spray Place into both nostrils daily.  . mirtazapine (REMERON) 15 MG tablet TAKE ONE-HALF TO ONE TABLET BY MOUTH AT BEDTIME  . raloxifene (EVISTA) 60 MG tablet TAKE ONE TABLET BY MOUTH DAILY  . Semaglutide,0.25  or 0.5MG /DOS, (OZEMPIC, 0.25 OR 0.5 MG/DOSE,) 2 MG/1.5ML SOPN Inject 0.25 mg into the skin once a week for 28 days, THEN 0.5 mg once a week.  . temazepam (RESTORIL) 30 MG capsule Take 1 capsule (30 mg total) by mouth at bedtime as needed for sleep.  Marland Kitchen triamterene-hydrochlorothiazide (DYAZIDE) 37.5-25 MG capsule TAKE ONE CAPSULE BY MOUTH DAILY  . venlafaxine XR (EFFEXOR-XR) 150 MG 24 hr capsule Take 1 capsule (150 mg total) by mouth daily with breakfast.  . zolpidem (AMBIEN) 10 MG tablet TAKE ONE TABLET BY MOUTH EVERY NIGHT AT BEDTIME AS NEEDED FOR SLEEP    Results for orders placed or performed in visit on 06/30/19 (from the past 72 hour(s))  POCT HgB A1C     Status: Abnormal   Collection Time: 06/30/19 11:06 AM  Result Value Ref Range   Hemoglobin A1C 6.3 (A) 4.0 - 5.6 %   HbA1c POC (<> result, manual  entry)     HbA1c, POC (prediabetic range)     HbA1c, POC (controlled diabetic range)      No results found.  Depression screen [facility] 2/9 05/20/2019 02/18/2018 09/17/2017  Decreased Interest 0 0 0  Down, Depressed, Hopeless 0 0 1  PHQ - 2 Score 0 0 1  Altered sleeping - - 1  Tired, decreased energy - - 1  Change in appetite - - 0  Feeling bad or failure about yourself  - - 1  Trouble concentrating - - 0  Moving slowly or fidgety/restless - - 0  Suicidal thoughts - - 0  PHQ-9 Score - - 4  Difficult doing work/chores - - Not difficult at all  Some encounter information is confidential and restricted. Go to Review Flowsheets activity to see all data.    GAD 7 : Generalized Anxiety Score 09/17/2017 02/15/2016 01/18/2016  Nervous, Anxious, on Edge 1 2 3   Control/stop worrying 0 2 3  Worry too much - different things 0 2 3  Trouble relaxing 1 2 3   Restless 0 1 2  Easily annoyed or irritable 0 1 2  Afraid - awful might happen 0 1 3  Total GAD 7 Score 2 11 19   Anxiety Difficulty Not difficult at all Somewhat difficult Very difficult  Some encounter information is confidential and restricted. Go to Review Flowsheets activity to see all data.      All questions at time of visit were answered - patient instructed to contact office with any additional concerns or updates.  ER/RTC precautions were reviewed with the patient.  Please note: voice recognition software was used to produce this document, and typos may escape review. Please contact Dr. Sheppard Coil for any needed clarifications.

## 2019-06-30 NOTE — Patient Instructions (Addendum)
A1C looking good Today 6.3 Last check   Weight trending downward:  Wt Readings from Last 3 Encounters:  06/30/19 206 lb 1.9 oz (93.5 kg)  05/20/19 212 lb (96.2 kg)  01/16/19 212 lb (96.2 kg)   Blood pressure looking better!  BP Readings from Last 3 Encounters:  06/30/19 136/75  05/20/19 (!) 171/81  07/17/18 (!) 148/80   Will keep on the Ozmepic for now - let me know if still causing nausea problems  Will try Ambien Cr 12.5 mg as needed for severe insomnia - let mw know how it works!

## 2019-07-11 ENCOUNTER — Other Ambulatory Visit: Payer: Self-pay | Admitting: Medical-Surgical

## 2019-07-11 ENCOUNTER — Other Ambulatory Visit: Payer: Self-pay

## 2019-07-11 MED ORDER — ATORVASTATIN CALCIUM 20 MG PO TABS: 20.0000 mg | ORAL_TABLET | Freq: Every day | ORAL | 1 refills | Status: DC

## 2019-07-11 NOTE — Telephone Encounter (Signed)
Routing to PCP

## 2019-07-14 ENCOUNTER — Encounter: Payer: Self-pay | Admitting: Osteopathic Medicine

## 2019-07-16 ENCOUNTER — Other Ambulatory Visit: Payer: Self-pay | Admitting: Medical-Surgical

## 2019-07-16 DIAGNOSIS — F418 Other specified anxiety disorders: Secondary | ICD-10-CM

## 2019-07-16 MED ORDER — ZOLPIDEM TARTRATE 10 MG PO TABS: ORAL_TABLET | ORAL | 5 refills | Status: DC

## 2019-07-16 NOTE — Telephone Encounter (Signed)
Routing to PCP

## 2019-07-22 ENCOUNTER — Ambulatory Visit: Payer: Medicare Other

## 2019-08-01 ENCOUNTER — Encounter: Payer: Self-pay | Admitting: Osteopathic Medicine

## 2019-08-04 NOTE — Telephone Encounter (Signed)
Let's try calling again If no answer, will need to contact [facility] and get safety check

## 2019-08-04 NOTE — Telephone Encounter (Signed)
I called [phone] and reached patient.  I explained to patient we had been trying to reach her after the My Chart message she sent with the number provided; [phone].  Patient is doing better but still has concerns about this circular motion she is on with no sleeping.  Patient advised she wanted to speak with someone now concerning the issue.  I advised the patient to go the our new [facility] located at [address], [place], [place]  [address] before 5pm today (there hours are 8-5pm).  She told me she may not be able to go today before 5pm but she would definitely go tomorrow.  I ask her to call us back after she went.

## 2019-08-05 ENCOUNTER — Other Ambulatory Visit: Payer: Self-pay

## 2019-08-05 ENCOUNTER — Ambulatory Visit
Admission: RE | Admit: 2019-08-05 | Discharge: 2019-08-05 | Disposition: A | Discharge status: Home / Self Care | Payer: Medicare Other | Source: Ambulatory Visit | Attending: Osteopathic Medicine | Admitting: Osteopathic Medicine

## 2019-08-05 ENCOUNTER — Ambulatory Visit (HOSPITAL_COMMUNITY)
Admission: EM | Admit: 2019-08-05 | Discharge: 2019-08-05 | Disposition: A | Discharge status: Home / Self Care | Payer: Medicare Other | Attending: Psychiatry | Admitting: Psychiatry

## 2019-08-05 DIAGNOSIS — G4700 Insomnia, unspecified: Secondary | ICD-10-CM | POA: Diagnosis not present

## 2019-08-05 DIAGNOSIS — R45851 Suicidal ideations: Secondary | ICD-10-CM | POA: Insufficient documentation

## 2019-08-05 DIAGNOSIS — Z79899 Other long term (current) drug therapy: Secondary | ICD-10-CM | POA: Insufficient documentation

## 2019-08-05 DIAGNOSIS — F419 Anxiety disorder, unspecified: Secondary | ICD-10-CM | POA: Insufficient documentation

## 2019-08-05 DIAGNOSIS — F411 Generalized anxiety disorder: Secondary | ICD-10-CM

## 2019-08-05 MED ORDER — QUETIAPINE FUMARATE 25 MG PO TABS
25.0000 mg | ORAL_TABLET | Freq: Every day | ORAL | 0 refills | Status: DC
Start: 2019-08-05 — End: 2020-01-01

## 2019-08-05 NOTE — BH Assessment (Signed)
Comprehensive Clinical Assessment (CCA) Note  08/05/2019 [name] [id number]  Patient is a 74 year old female with a significant history of insomnia, that has worsened recently causing significant anxiety and vague SI.  Patient was hospitalized once in 1989 after 4-5 nights of no sleep.  She was prescribed an antidepressant at that time.  Patient reports this current episode of insomnia seems "worse" and she is experiencing significant anxiety as a result.  She states she is hyper focused on not being able to get to sleep and then fears falling asleep.  She denies current SI, however states she does not want to live like this.  She denies having a plan or intent to harm herself. She denies HI and AVH.    Per [name], NP patient does not meet criteria for inpatient treatment.   The recommendation is follow up with [name].  Patient was provided with an Rx for seroquel and she has been scheduled in [name] for outpatient treatment.  Appt info included in the AVS to be provided to pt upon d/c.     Visit Diagnosis:      ICD-10-CM   1. Insomnia, unspecified type  G47.00   2. Generalized anxiety disorder  F41.1       CCA Screening, Triage and Referral (STR)  Patient Reported Information How did you hear about [place]? Self  Referral name: No data recorded Referral phone number: No data recorded  Whom do you see for routine medical problems? Primary Care  Practice[facility]Facility Name: [facility]  Practice/Facility Phone Number: [phone]  Name of Contact: Dr. Anne Fu Number: same  Contact Fax Number: Unknown  Prescriber Name: Same  Prescriber Address (if known): Same   What Is the Reason for Your Visit/Call Today? Insomnia, worsening anxiety  How Long Has This Been Causing You Problems? 1-6 months  What Do You Feel Would Help You the Most Today? Medication;Therapy   Have You Recently Been in Any Inpatient Treatment  (Hospital/Detox/Crisis Center/28-Day Program)? No  Name/Location of Program/Hospital:No data recorded How Long Were You There? No data recorded When Were You Discharged? No data recorded  Have You Ever Received Services From [facility] Before? No  Who Do You See at [facility]? No data recorded  Have You Recently Had Any Thoughts About Hurting Yourself? Yes (vague, intermittent SI)  Are You Planning to Commit Suicide/Harm Yourself At This time? No   Have you Recently Had Thoughts About [place]? No  Explanation: No data recorded  Have You Used Any Alcohol or Drugs in the Past 24 Hours? No  How Long Ago Did You Use Drugs or Alcohol? No data recorded What Did You Use and How Much? No data recorded  Do You Currently Have a Therapist/Psychiatrist? No  Name of Therapist/Psychiatrist: No data recorded  Have You Been Recently Discharged From Any Office Practice or Programs? No  Explanation of Discharge From Practice/Program: No data recorded    CCA Screening Triage Referral Assessment Type of Contact: Face-to-Face  Is this Initial or Reassessment? No data recorded Date Telepsych consult ordered in [facility]:  No data recorded Time Telepsych consult ordered in [facility]:  No data recorded  Patient Reported Information Reviewed? Yes  Patient Left Without Being Seen? No data recorded Reason for Not Completing Assessment: No data recorded  Collateral Involvement: N/A   Does Patient Have a Court Appointed Legal Guardian? No data recorded Name and Contact of Legal Guardian: No data recorded If  Minor and Not Living with Parent(s), Who has Custody? No data recorded Is CPS involved or ever been involved? Never  Is APS involved or ever been involved? Never   Patient Determined To Be At Risk for Harm To Self or Others Based on Review of Patient Reported Information or Presenting Complaint? No  Method: No data recorded Availability of Means: No data recorded Intent: No data  recorded Notification Required: No data recorded Additional Information for Danger to Others Potential: No data recorded Additional Comments for Danger to Others Potential: No data recorded Are There Guns or Other Weapons in Your Home? No data recorded Types of Guns/Weapons: No data recorded Are These Weapons Safely Secured?                            No data recorded Who Could Verify You Are Able To Have These Secured: No data recorded Do You Have any Outstanding Charges, Pending Court Dates, Parole/Probation? No data recorded Contacted To Inform of Risk of Harm To Self or Others: No data recorded  Location of Assessment: [facility] Assessment Services   Does Patient Present under Involuntary Commitment? No  IVC Papers Initial File Date: No data recorded  [place] of Residence: [place]   Patient Currently Receiving the Following Services: Not Receiving Services   Determination of Need: Routine (7 days)   Options For Referral: Medication Management;Outpatient Therapy     CCA Biopsychosocial  Intake/Chief Complaint:  CCA Intake With Chief Complaint CCA Part Two Date: 08/05/19 CCA Part Two Time: 1213 Chief Complaint/Presenting Problem: Patient has battled insomnia much of her life, on and off.  She reports it has worsened recently and is causing worsening anxiety and vague SI. Patient's Currently Reported Symptoms/Problems: Patient states insomnia is causing anxiety and depression, with vague fleeting SI. Individual's Strengths: N/A Individual's Preferences: Wants to improve sleep to improve overall mood. Individual's Abilities: N/A Type of Services Patient Feels Are Needed: Medication management, therapy Initial Clinical Notes/Concerns: Patient has had outpatient treatment in 1980s, however she didn't find it helpful.  She was hospitalized once in 1990 due to no sleep for 4-5 days.  Mental Health Symptoms Depression:  Depression: Sleep (too much or little)  Mania:  Mania: N/A   Anxiety:   Anxiety: Worrying, Tension, Sleep, Difficulty concentrating  Psychosis:  Psychosis: None  Trauma:  Trauma: None  Obsessions:  Obsessions: None  Compulsions:  Compulsions: None  Inattention:     Hyperactivity/Impulsivity:  Hyperactivity/Impulsivity: N/A  Oppositional/Defiant Behaviors:  Oppositional/Defiant Behaviors: N/A  Emotional Irregularity:  Emotional Irregularity: N/A  Other Mood/Personality Symptoms:      Mental Status Exam Appearance and self-care  Stature:  Stature: Average  Weight:  Weight: Overweight  Clothing:  Clothing: Neat/clean  Grooming:  Grooming: Well-groomed  Cosmetic use:  Cosmetic Use: Age appropriate  Posture/gait:  Posture/Gait: Normal  Motor activity:  Motor Activity: Not Remarkable  Sensorium  Attention:  Attention: Normal  Concentration:  Concentration: Normal  Orientation:  Orientation: X5  Recall/memory:  Recall/Memory: Normal  Affect and Mood  Affect:  Affect: Appropriate  Mood:  Mood: Anxious  Relating  Eye contact:  Eye Contact: Normal  Facial expression:  Facial Expression: Responsive  Attitude toward examiner:  Attitude Toward Examiner: Cooperative  Thought and Language  Speech flow: Speech Flow: Normal  Thought content:  Thought Content: Appropriate to Mood and Circumstances  Preoccupation:  Preoccupations: None  Hallucinations:  Hallucinations: None  Organization:     Community education officer  Fund of Knowledge:  Fund of Knowledge: Average  Intelligence:  Intelligence: Average  Abstraction:  Abstraction: Normal  Judgement:  Judgement: Normal  Reality Testing:  Reality Testing: Adequate  Insight:  Insight: Good  Decision Making:  Decision Making: Normal  Social Functioning  Social Maturity:  Social Maturity: Responsible  Social Judgement:  Social Judgement: Normal  Stress  Stressors:  Stressors:  (sleep problems)  Coping Ability:  Coping Ability: English as a second language teacher Deficits:  Skill Deficits: None  Supports:  Supports:  Family     Religion: Religion/Spirituality Are You A Religious Person?: No  Leisure/Recreation:    Exercise/Diet: Exercise/Diet Do You Exercise?: No Have You Gained or Lost A Significant Amount of Weight in the Past Six Months?: No Do You Follow a Special Diet?: No Do You Have Any Trouble Sleeping?: Yes Explanation of Sleeping Difficulties: Trouble falling asleep and staying asleep.   CCA Employment/Education  Employment/Work Situation: Employment / Work Copywriter, advertising Employment situation: Retired Archivist job has been impacted by current illness: No What is the longest time patient has a held a job?: N/A Where was the patient employed at that time?: N/A Has patient ever been in the [employer]?: No  Education: Education Is Patient Currently Attending School?: No Did Teacher, adult education From [employer]?: Yes Did Physicist, medical?: Yes What Type of College Degree Do you Have?: BA English/Library Did You Attend Graduate School?: Yes What is Your Press photographer?: Master's 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, contracting Did You Have An Individualized Education Program (IIEP): No Did You Have Any Difficulty At [employer]?: No Patient's Education Has Been Impacted by Current Illness: No   CCA Family/Childhood History  Family and Relationship History: Family history Marital status: Married Number of Years Married: 39 What types of issues is patient dealing with in the relationship?: N/A Additional relationship information: N/A What is your sexual orientation?: heterosexual Has your sexual activity been affected by drugs, alcohol, medication, or emotional stress?: no Does patient have children?: Yes How many children?: 1 How is patient's relationship with their children?: 19 y.o.son, close relationshiop.  3 grandchilren.  Childhood History:  Childhood History By whom was/is the patient raised?: Both parents Description of patient's relationship with caregiver when they were a  child: N/A Patient's description of current relationship with people who raised him/her: N/A How were you disciplined when you got in trouble as a child/adolescent?: N/A Did patient suffer any verbal/emotional/physical/sexual abuse as a child?: No Has patient ever been sexually abused/assaulted/raped as an adolescent or adult?: No Was the patient ever a victim of a crime or a disaster?: No Witnessed domestic violence?: No Has patient been affected by domestic violence as an adult?: No  Child/Adolescent Assessment:     CCA Substance Use  Alcohol/Drug Use: Alcohol / Drug Use Pain Medications: None Prescriptions: None Over the Counter: None History of alcohol / drug use?: No history of alcohol / drug abuse       ASAM's:  Six Dimensions of Multidimensional Assessment  Dimension 1:  Acute Intoxication and/or Withdrawal Potential:      Dimension 2:  Biomedical Conditions and Complications:      Dimension 3:  Emotional, Behavioral, or Cognitive Conditions and Complications:     Dimension 4:  Readiness to Change:     Dimension 5:  Relapse, Continued use, or Continued Problem Potential:     Dimension 6:  Recovery/Living Environment:     ASAM Severity Score:    ASAM Recommended Level of Treatment:     Substance  use Disorder (SUD) None    Recommendations for Services/Supports/Treatments:    DSM5 Diagnoses: Patient Active Problem List   Diagnosis Date Noted  . Post-menopausal 05/14/2017  . Prediabetes 01/12/2017  . Snoring 07/20/2016  . Obsessive-compulsive personality trait 12/07/2015  . Morbid obesity ([place]) 09/19/2015  . Primary localized osteoarthritis of right knee   . Cardiac risk counseling 08/16/2015  . Essential hypertension 08/04/2015  . Hyperlipidemia 08/04/2015  . Osteopenia 08/04/2015  . Anxiety associated with depression 08/04/2015  . Right knee pain 06/14/2015  . Hyposomnia, insomnia or sleeplessness associated with anxiety 06/14/2015    Patient  Centered Plan: Patient is on the following Treatment Plan(s):  Anxiety   Disposition: Per [name], NP patient does not meet inpatient treatment criteria.  The recommendation is follow up with [facility].  She was provided with an Rx for seroquel and she has been scheduled in [facility] for outpatient treatment.  Appt info included in the AVS to be provided to pt upon d/c.    Laurell Roof, [facility]

## 2019-08-05 NOTE — Discharge Instructions (Addendum)
Start Seroquel 25 mg by mouth at bedtime Do not use Ambien and Seroquel at the same time unless directed to do so by your outpatient provider

## 2019-08-05 NOTE — ED Provider Notes (Signed)
Behavioral Health Medical Screening Exam  [name] is a 74 y.o. female, married, who presented with complaints of insomnia, anxiety, passive suicidal thoughts, guilt, and anxiety. She reported a history of insomnia for 50 years. She was hospitalized in 1989 for "really bad" insomnia and was started on an antidepressant. She reports having increase anxiety because she cannot sleep. She reports focusing on sleep at night, feeling afraid to go to sleep and feeling intense because she's unable to sleep. She endorses passive suicidal thoughts, stating "I don't want to live this way." She denies having  a suicide plan or intent. She is able to contract for safety if she's able to get some help today. Patient lives at home with her husband of 74 years and he is very supportive. Patient continues to state that she is not going to harm herself and just wants some sleep. Denies any firearms in the house.  Per the patient, she is currently prescribed Klonopin, Ambien, and Effexor by her PCP.   Total Time spent with patient: 30 minutes  Psychiatric Specialty Exam   Presentation  General Appearance:Appropriate for Environment  Eye Contact:Good  Speech:Clear and Coherent;Normal Rate  Speech Volume:Normal  Handedness:Right   Mood and Affect  Mood:Anxious  Affect:Congruent   Thought Process  Thought Processes:Coherent  Descriptions of Associations:Intact  Orientation:Full (Time, Place and Person)  Thought Content:WDL  Hallucinations:None  Ideas of Reference:None  Suicidal Thoughts:Yes, Passive ("I don't want to live this way but I feel safe if I can get some help.") Without Intent;Without Plan  Homicidal Thoughts:No   Sensorium  Memory:Immediate Fair;Recent Fair;Remote Fair  Judgment:Fair  Insight:No data recorded  Executive Functions  Concentration:Fair  Attention Span:Fair  [place]   Psychomotor Activity  Psychomotor  Activity:Normal   Assets  Assets:Communication Skills;Desire for Improvement;Financial Resources/Insurance;Housing;Intimacy;Leisure Time;Transportation;Physical Health   Sleep  Sleep:Poor  Number of hours: No data recorded  Physical Exam: Physical Exam Vitals and nursing note reviewed.  Constitutional:      Appearance: She is well-developed.  Cardiovascular:     Rate and Rhythm: Normal rate.  Pulmonary:     Effort: Pulmonary effort is normal.  Musculoskeletal:        General: Normal range of motion.  Skin:    General: Skin is warm.  Neurological:     Mental Status: She is alert and oriented to person, place, and time.    Review of Systems  Constitutional: Negative.   HENT: Negative.   Eyes: Negative.   Respiratory: Negative.   Cardiovascular: Negative.   Gastrointestinal: Negative.   Genitourinary: Negative.   Musculoskeletal: Negative.   Skin: Negative.   Neurological: Negative.   Endo/Heme/Allergies: Negative.   Psychiatric/Behavioral: Positive for suicidal ideas. The patient is nervous/anxious and has insomnia.    Blood pressure (!) 154/70, pulse (!) 105, temperature 98.1 F (36.7 C), temperature source Oral, resp. rate 18, SpO2 98 %. There is no height or weight on file to calculate BMI.  Musculoskeletal: Strength & Muscle Tone: within normal limits Gait & Station: normal Patient leans: Right   Recommendations:  Based on my evaluation the patient does not appear to have an emergency medical condition.  Start Seroquel 25 mg PO at bedtime for insomnia. Do not take Ambien while taking the Seroquel.  Follow up with psychiatry for outpatient services for medication management and therapy. Appointment has been made.   H[place], FNP 08/05/2019, 11:55 AM

## 2019-08-06 ENCOUNTER — Encounter: Payer: Self-pay | Admitting: Osteopathic Medicine

## 2019-08-08 ENCOUNTER — Ambulatory Visit (INDEPENDENT_AMBULATORY_CARE_PROVIDER_SITE_OTHER): Payer: Medicare Other | Admitting: Orthopaedic Surgery

## 2019-08-08 ENCOUNTER — Encounter: Payer: Self-pay | Admitting: Orthopaedic Surgery

## 2019-08-08 VITALS — Ht 62.0 in | Wt 195.0 lb

## 2019-08-08 DIAGNOSIS — M65342 Trigger finger, left ring finger: Secondary | ICD-10-CM | POA: Diagnosis not present

## 2019-08-08 MED ORDER — BUPIVACAINE HCL 0.5 % IJ SOLN
0.3300 mL | INTRAMUSCULAR | Status: AC | PRN
  Administered 2019-08-08: .33 mL

## 2019-08-08 MED ORDER — METHYLPREDNISOLONE ACETATE 40 MG/ML IJ SUSP
13.3300 mg | INTRAMUSCULAR | Status: AC | PRN
  Administered 2019-08-08: 13.33 mg

## 2019-08-08 MED ORDER — LIDOCAINE HCL 1 % IJ SOLN
0.3000 mL | INTRAMUSCULAR | Status: AC | PRN
Start: 2019-08-08 — End: 2019-08-08
  Administered 2019-08-08: .3 mL

## 2019-08-08 NOTE — Progress Notes (Signed)
Office Visit Note   Patient: [name]           Date of Birth: 08-18-1945           MRN: [id number] Visit Date: 08/08/2019              Requested by: Emeterio Reeve, [address] [place],  [place] [address] PCP: Emeterio Reeve, DO   Assessment & Plan: Visit Diagnoses:  1. Trigger finger, left ring finger     Plan: We repeated the trigger finger injection today into her left ring finger.  We will see her back as needed.  Follow-Up Instructions: No follow-ups on file.   Orders:  No orders of the defined types were placed in this encounter.  No orders of the defined types were placed in this encounter.     Procedures: Hand/UE Inj: L ring A1 for trigger finger on 08/08/2019 10:19 PM Indications: pain Details: 25 G needle Medications: 0.3 mL lidocaine 1 %; 0.33 mL bupivacaine 0.5 %; 13.33 mg methylPREDNISolone acetate 40 MG/ML Outcome: tolerated well, no immediate complications Consent was given by the patient. Patient was prepped and draped in the usual sterile fashion.       Clinical Data: No additional findings.   Subjective: Chief Complaint  Patient presents with  . Left Hand - Pain    Left ring trigger finger    [name] returns today for recurrent left ring trigger finger.  Previous injection was 4 months ago with good relief but the started bothering her last week.  She would like to have another injection.   Review of Systems   Objective: Vital Signs: Ht 5\' 2"  (1.575 m)   Wt 195 lb (88.5 kg)   BMI 35.67 kg/m   Physical Exam  Ortho Exam Left hand exam is unchanged and shows recurrent ring trigger finger Specialty Comments:  No specialty comments available.  Imaging: No results found.   PMFS History: Patient Active Problem List   Diagnosis Date Noted  . Trigger finger, left ring finger 08/08/2019  . Post-menopausal 05/14/2017  . Prediabetes 01/12/2017  . Snoring 07/20/2016  . Obsessive-compulsive  personality trait 12/07/2015  . Morbid obesity ([place]) 09/19/2015  . Primary localized osteoarthritis of right knee   . Cardiac risk counseling 08/16/2015  . Essential hypertension 08/04/2015  . Hyperlipidemia 08/04/2015  . Osteopenia 08/04/2015  . Anxiety associated with depression 08/04/2015  . Right knee pain 06/14/2015  . Hyposomnia, insomnia or sleeplessness associated with anxiety 06/14/2015   Past Medical History:  Diagnosis Date  . Anxiety   . Depression   . Headache    SINUS  . Hyperlipidemia   . Hypertension   . Osteopenia   . Primary localized osteoarthritis of right knee     Family History  Problem Relation Age of Onset  . Heart failure Mother   . Heart attack Mother   . Alcohol abuse Maternal Aunt   . Heart attack Father   . Irregular heart beat Sister   . Hypertension Sister   . Heart disease Brother     Past Surgical History:  Procedure Laterality Date  . BREAST EXCISIONAL BIOPSY Right    benign cyst removal   . CARPAL TUNNEL RELEASE Right 2015   Ortho in [place]  . LASIK    . MENISCUS REPAIR Right 2011   Orthopedic in [place]  . TOTAL KNEE ARTHROPLASTY Right 09/20/2015  . TOTAL KNEE ARTHROPLASTY Right 09/20/2015   Procedure: TOTAL KNEE ARTHROPLASTY;  Surgeon: Elsie Saas, MD;  Location: [facility];  Service: Orthopedics;  Laterality: Right;  . Trigger Thumb Right 2015   Done in [facility] at the same time as the carpal tunnel release  . TUBAL LIGATION  1980   Social History   Occupational History  . Occupation: retired    Comment: Licensed conveyancer  Tobacco Use  . Smoking status: Never Smoker  . Smokeless tobacco: Never Used  Vaping Use  . Vaping Use: Never used  Substance and Sexual Activity  . Alcohol use: No    Comment: 1 glass a month  . Drug use: No  . Sexual activity: Yes

## 2019-08-10 ENCOUNTER — Other Ambulatory Visit: Payer: Self-pay | Admitting: Osteopathic Medicine

## 2019-08-10 DIAGNOSIS — I10 Essential (primary) hypertension: Secondary | ICD-10-CM

## 2019-08-11 ENCOUNTER — Other Ambulatory Visit: Payer: Self-pay | Admitting: Osteopathic Medicine

## 2019-08-11 ENCOUNTER — Telehealth (HOSPITAL_COMMUNITY): Payer: Self-pay | Admitting: *Deleted

## 2019-08-11 ENCOUNTER — Telehealth: Payer: Self-pay | Admitting: Orthopaedic Surgery

## 2019-08-11 NOTE — Telephone Encounter (Signed)
Call from patient stating the Seroquel she was prescribed thru urgent care is not sedating as planned but is making her very anxious so she stopped taking it. Called her back, she stopped the Seroquel on Sat HS and resumed taking her Ambien on Sunday night, and slept. She has enough of the Ambien for now. She has an appt with a therapist on 7/2 and PA on 7/20. She is aware of them both and intends to keep them both. Encouraged to call back if any further problems.

## 2019-08-11 NOTE — Telephone Encounter (Signed)
error 

## 2019-08-12 ENCOUNTER — Other Ambulatory Visit: Payer: Self-pay | Admitting: Osteopathic Medicine

## 2019-08-12 DIAGNOSIS — F418 Other specified anxiety disorders: Secondary | ICD-10-CM

## 2019-08-12 NOTE — Telephone Encounter (Signed)
Last refill- 07/16/2019 Last Ov-06/30/2019

## 2019-08-15 ENCOUNTER — Other Ambulatory Visit: Payer: Self-pay

## 2019-08-15 ENCOUNTER — Ambulatory Visit (INDEPENDENT_AMBULATORY_CARE_PROVIDER_SITE_OTHER): Payer: Medicare Other | Admitting: Clinical

## 2019-08-15 DIAGNOSIS — F419 Anxiety disorder, unspecified: Secondary | ICD-10-CM | POA: Diagnosis not present

## 2019-08-15 DIAGNOSIS — F5101 Primary insomnia: Secondary | ICD-10-CM | POA: Diagnosis not present

## 2019-08-16 DIAGNOSIS — F419 Anxiety disorder, unspecified: Secondary | ICD-10-CM | POA: Insufficient documentation

## 2019-08-16 DIAGNOSIS — F5101 Primary insomnia: Secondary | ICD-10-CM | POA: Insufficient documentation

## 2019-08-16 NOTE — Progress Notes (Signed)
   THERAPIST PROGRESS NOTE  Session Time: 40 minutes  Participation Level: Active  Behavioral Response: CasualAlertEuthymic  Type of Therapy: Individual Therapy  Treatment Goals addressed: Anxiety  Interventions: CBT  Summary:  [name] is a 74 y.o. female who presents with symptoms of insomnia and anxiety. Client is a discharge from [facility] for anxiety related to inability to sleep. Client presented oriented times five, appropriately dressed, and friendly. Client denied hallucinations and delusions. Client reported a history of issues related to insomnia since the 90's. Client reported her mother also had issues related to insomnia.  Client reported in earlier years she and her husband moved frequently due to her husbands work with the government. 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 Client reported other treatments for her anxiety due did not find it to be helpful.  Client reported leading up to her treatment in urgent care her anxiety worsened because she was focused on not being able to sleep. Client stated, "I get focused on not sleeping and it gets worse". Client reported she was tried on Seroquel but it "hyped me up". Client reported she was switched to Ambien and it seems to be working better for her. Client reported she currently is doing well and does not have other stressors affecting her symptoms. Client reported she enjoys her time at home doing her hobbies and spending time with her grandchildren. Client reported she has existing coping skills are breathing skills and listening to meditation CD's.        Suicidal/Homicidal: Nowithout intent/plan  Therapist Response:  Therapist reviewed the clients assessment from the [facility] with the client and the information that was provided. Therapist discussed confidentiality. Therapist made introductions. Therapist allowed time to focus on the clients thoughts and feelings related  to how she is currently doing. Therapist collaborated with the client to discuss her history with anxiety and insomnia.  Therapist assessed the clients current medication compliance and current symptoms. Therapist completed treatment plan for anxiety.    Plan: Return again for medication management as scheduled. Therapist offered therapy but the client declined. Client reported she doe not have contributing factors that affect her insomnia that she would need addressed in therapy.     Diagnosis: Anxiety disorder,unspecified   Insomnia   Birdena Jubilee Josephina Melcher, LCSW 08/16/2019

## 2019-09-02 ENCOUNTER — Encounter (HOSPITAL_COMMUNITY): Payer: Self-pay | Admitting: Psychiatry

## 2019-09-02 ENCOUNTER — Other Ambulatory Visit: Payer: Self-pay

## 2019-09-02 ENCOUNTER — Ambulatory Visit (INDEPENDENT_AMBULATORY_CARE_PROVIDER_SITE_OTHER): Payer: Medicare Other | Admitting: Psychiatry

## 2019-09-02 DIAGNOSIS — F5105 Insomnia due to other mental disorder: Secondary | ICD-10-CM

## 2019-09-02 DIAGNOSIS — F419 Anxiety disorder, unspecified: Secondary | ICD-10-CM

## 2019-09-02 DIAGNOSIS — F3340 Major depressive disorder, recurrent, in remission, unspecified: Secondary | ICD-10-CM | POA: Insufficient documentation

## 2019-09-02 DIAGNOSIS — F329 Major depressive disorder, single episode, unspecified: Secondary | ICD-10-CM | POA: Insufficient documentation

## 2019-09-02 DIAGNOSIS — F331 Major depressive disorder, recurrent, moderate: Secondary | ICD-10-CM

## 2019-09-02 DIAGNOSIS — F411 Generalized anxiety disorder: Secondary | ICD-10-CM | POA: Insufficient documentation

## 2019-09-02 MED ORDER — VENLAFAXINE HCL ER 37.5 MG PO CP24: 37.5000 mg | ORAL_CAPSULE | Freq: Every day | ORAL | 1 refills | Status: DC

## 2019-09-02 MED ORDER — ZOLPIDEM TARTRATE 10 MG PO TABS: ORAL_TABLET | ORAL | 0 refills | Status: DC

## 2019-09-02 NOTE — Progress Notes (Signed)
Psychiatric Initial Adult Assessment   Patient Identification: [name] MRN:  [id number] Date of Evaluation:  09/02/2019 Referral Source: [facility] Chief Complaint:   Visit Diagnosis:    ICD-10-CM   1. Hyposomnia, insomnia or sleeplessness associated with anxiety  F51.05 zolpidem (AMBIEN) 10 MG tablet   F41.9   2. Moderate episode of recurrent major depressive disorder (HCC)  F33.1 venlafaxine XR (EFFEXOR-XR) 37.5 MG 24 hr capsule    History of Present Illness: 74 year old female seen today for initial psychiatric evaluation.  Patient was referred to outpatient psychiatry by [facility] where she was seen on 08/05/2019.  At that time she was complaining of symptoms of insomnia, anxiety, and passive SI.  She has a psychiatric history of insomnia, anxiety, depression, and passive SI.  She is currently being managed on Seroquel 25 mg at bedtime, Effexor 150 mg daily, Klonopin 0.5 twice daily (perscribed by PCP), and Remeron 15 mg at bedtime.  She notes that Seroquel was ineffective in managing her sleep and notes that she discontinued after taking it for 4 days.  She notes that Seroquel worsened her insomnia.  She informed Probation officer that she restarted taking Ambien and notes that it has been more effective.  Patient also reports that she dislikes Remeron because it decreases her appetite/cause weight gain and she is wanting to discontinue the medication at this time.  Today patient endorses depressive symptoms such as insomnia, fatigue, feelings of guilt, anxiety, weight gain, and passive SI.  She informed Probation officer that when she has not slept for 2 to 3 days she feels that it would be better off if she was dead.  At this time she contracts for safety and notes that she does not have a plan.  She reports that she could never end her life because she cares too much about her husband, son, and grandchildren.  Patient reports that her insomnia drives anxiety.  She informed provider that over the last week  she had difficulty sleeping and doubled her Ambien dose.  She notes that now she is running low and is in need of a refill as her prescriptions will not be filled until the July 30th.  She informed Probation officer that her sleep hygiene is to take a warm shower, read, do deep breathing, and occasionally drink Diet Coke prior to getting a bed.  Writer informed patient to try to avoid caffeinated beverages before bed, exercise during the day, avoiding television/stimulating activities, and eat a balanced meal to enhance sleep hygiene.  She endorsed understanding and agreed.  She is agreeable to increasing her Effexor from 150 to 187.5 mg to help with symptoms of depression and anxiety. She has a refills on Effexor 150 mg for three months. Provider prescribed Effexor 37.5 mg and sent to preferred pharmacy.  She is also agreeable to discontinuing Seroquel and restarting Ambien. Provider gave patient partial refill because patient has a prescription that will be filled on 09/12/2019.  Patient is also also agreeable to discontinue  Remeron. She will continue receiving her Klonopin for her PCP.  No other concerns noted at this time.    Associated Signs/Symptoms: Depression Symptoms:  insomnia, fatigue, feelings of worthlessness/guilt, recurrent thoughts of death, weight gain, (Hypo) Manic Symptoms:  Denies Anxiety Symptoms:  Excessive Worry, Psychotic Symptoms:  Denies PTSD Symptoms: NA  Past Psychiatric History: Insomnia, Depression, and anxiety  Previous Psychotropic Medications: Yes trialed zoloft and prozac  Substance Abuse History in the last 12 months:  No.  Consequences of Substance Abuse: NA  Past Medical History:  Past Medical History:  Diagnosis Date  . Anxiety   . Depression   . Headache    SINUS  . Hyperlipidemia   . Hypertension   . Osteopenia   . Primary localized osteoarthritis of right knee     Past Surgical History:  Procedure Laterality Date  . BREAST EXCISIONAL BIOPSY Right     benign cyst removal   . CARPAL TUNNEL RELEASE Right 2015   Ortho in [place]  . LASIK    . MENISCUS REPAIR Right 2011   Orthopedic in [place]  . TOTAL KNEE ARTHROPLASTY Right 09/20/2015  . TOTAL KNEE ARTHROPLASTY Right 09/20/2015   Procedure: TOTAL KNEE ARTHROPLASTY;  Surgeon: Elsie Saas, MD;  Location: [facility];  Service: Orthopedics;  Laterality: Right;  . Trigger Thumb Right 2015   Done in [facility] at the same time as the carpal tunnel release  . TUBAL LIGATION  1980    Family Psychiatric History: Mother anxiety, maternal aunts alcohol use, maternal aunt anorexia  Family History:  Family History  Problem Relation Age of Onset  . Heart failure Mother   . Heart attack Mother   . Alcohol abuse Maternal Aunt   . Heart attack Father   . Irregular heart beat Sister   . Hypertension Sister   . Heart disease Brother     Social History:   Social History   Socioeconomic History  . Marital status: Married    Spouse name: Purcell Nails  . Number of children: 1  . Years of education: 17.5  . Highest education level: Master's degree (e.g., MA, MS, MEng, MEd, MSW, MBA)  Occupational History  . Occupation: retired    Comment: Licensed conveyancer  Tobacco Use  . Smoking status: Never Smoker  . Smokeless tobacco: Never Used  Vaping Use  . Vaping Use: Never used  Substance and Sexual Activity  . Alcohol use: No    Comment: 1 glass a month  . Drug use: No  . Sexual activity: Yes  Other Topics Concern  . Not on file  Social History Narrative   Reads paper, walks, housework errands. Caffeine use daily   Social Determinants of Health   Financial Resource Strain:   . Difficulty of Paying Living Expenses:   Food Insecurity:   . Worried About Charity fundraiser in the Last Year:   . Arboriculturist in the Last Year:   Transportation Needs:   . Film/video editor (Medical):   Marland Kitchen Lack of Transportation (Non-Medical):   Physical Activity:   . Days of Exercise per Week:   . Minutes of  Exercise per Session:   Stress:   . Feeling of Stress :   Social Connections:   . Frequency of Communication with Friends and Family:   . Frequency of Social Gatherings with Friends and Family:   . Attends Religious Services:   . Active Member of Clubs or Organizations:   . Attends Archivist Meetings:   Marland Kitchen Marital Status:     Additional Social History: Patient resided in [place] with her husband.  She has 1 son.  She is a retired Licensed conveyancer.  She denies alcohol, tobacco, or illicit drug use.  Allergies:   Allergies  Allergen Reactions  . Penicillins Hives    Tolerates Cephalosporins   . Sulfa Antibiotics Hives         Metabolic Disorder Labs: Lab Results  Component Value Date   HGBA1C 6.3 (A) 06/30/2019   MPG 131 01/17/2019  MPG 128 11/28/2017   No results found for: PROLACTIN Lab Results  Component Value Date   CHOL 157 06/30/2019   TRIG 161 (H) 06/30/2019   HDL 56 06/30/2019   CHOLHDL 2.8 06/30/2019   LDLCALC 75 06/30/2019   [place] 81 01/17/2019   Lab Results  Component Value Date   TSH 4.01 01/08/2017    Therapeutic Level Labs: No results found for: LITHIUM No results found for: CBMZ No results found for: VALPROATE  Current Medications: Current Outpatient Medications  Medication Sig Dispense Refill  . amLODipine-benazepril (LOTREL) 5-20 MG capsule TAKE ONE CAPSULE BY MOUTH DAILY 30 capsule 0  . atorvastatin (LIPITOR) 20 MG tablet TAKE ONE TABLET BY MOUTH DAILY **OFFICE VISIT NEEDED FOR ADDITIONAL REFILLS** 90 tablet 3  . atorvastatin (LIPITOR) 20 MG tablet Take 1 tablet (20 mg total) by mouth daily. 90 tablet 1  . clonazePAM (KLONOPIN) 0.5 MG tablet TAKE 1/2 TO 1 TABLET BY MOUTH TWO TIMES A DAY AS NEEDED FOR ANXIETY. DO NOT TAKE WITH TEMAZEPAM OR AMBIEN. #60 for 30 days 60 tablet 0  . fluticasone (FLONASE) 50 MCG/ACT nasal spray Place into both nostrils daily.    . QUEtiapine (SEROQUEL) 25 MG tablet Take 1 tablet (25 mg total) by mouth at  bedtime. 30 tablet 0  . raloxifene (EVISTA) 60 MG tablet TAKE ONE TABLET BY MOUTH DAILY 90 tablet 3  . Semaglutide,0.25 or 0.5MG /DOS, (OZEMPIC, 0.25 OR 0.5 MG/DOSE,) 2 MG/1.5ML SOPN Inject 0.25 mg into the skin once a week for 28 days, THEN 0.5 mg once a week. 2 pen 3  . triamterene-hydrochlorothiazide (DYAZIDE) 37.5-25 MG capsule TAKE ONE CAPSULE BY MOUTH DAILY 90 capsule 0  . venlafaxine XR (EFFEXOR-XR) 150 MG 24 hr capsule Take 1 capsule (150 mg total) by mouth daily with breakfast. 90 capsule 1  . venlafaxine XR (EFFEXOR-XR) 37.5 MG 24 hr capsule Take 1 capsule (37.5 mg total) by mouth daily with breakfast. 30 capsule 1  . zolpidem (AMBIEN) 10 MG tablet TAKE ONE TABLET BY MOUTH EVERY NIGHT AT BEDTIME AS NEEDED FOR SLEEP 10 tablet 0   No current facility-administered medications for this visit.    Musculoskeletal: Strength & Muscle Tone: within normal limits Gait & Station: normal Patient leans: N/A  Psychiatric Specialty Exam: Review of Systems  There were no vitals taken for this visit.There is no height or weight on file to calculate BMI.  General Appearance: Well Groomed  Eye Contact:  Good  Speech:  Clear and Coherent and Normal Rate  Volume:  Normal  Mood:  Anxious and Depressed  Affect:  Congruent  Thought Process:  Coherent, Goal Directed and Linear  Orientation:  Full (Time, Place, and Person)  Thought Content:  WDL and Logical  Suicidal Thoughts:  No  Homicidal Thoughts:  No  Memory:  Immediate;   Good Recent;   Good Remote;   Good  Judgement:  Good  Insight:  Good  Psychomotor Activity:  Normal  Concentration:  Concentration: Good and Attention Span: Good  Recall:  Good  Fund of Knowledge:Good  Language: Good  Akathisia:  No  Handed:  Right  AIMS (if indicated):  Not Done  Assets:  Communication Skills Desire for Improvement Financial Resources/Insurance Housing Social Support  ADL's:  Intact  Cognition: WNL  Sleep:  Fair   Screenings: GAD-7      Office Visit from 09/17/2017 in [facility] Office Visit from 02/15/2016 in [place] Office Visit  from 01/18/2016 in [place] Visit from 12/14/2015 in [place]  Total GAD-7 Score 2 11 19 13     PHQ2-9     Office Visit from 05/20/2019 in [place] from 02/18/2018 in [place] Visit from 09/17/2017 in [place] Office Visit from 12/28/2016 in [place] Office Visit from 02/15/2016 in [place]  PHQ-2 Total Score 0 0 1 2 3   PHQ-9 Total Score -- -- 4 5 12       Assessment and Plan: Patient endorses symptoms anxiety and depression. She is agreeable to increasing her Effexor from 150 to 187.5 mg. She has a refills on Effexor 150 mg for three months. Provider prescribed Effexor 37.5 mg and sent to preferred pharmacy.  She notes that Seroquel was ineffective in managing her sleep and reports that she restarted Ambien. Patient notes that she took extra doses of Ambien and is in need of a refill. Provider gave patient partial refill.  Patient also notes that Remeron caused her to eat excessively. She would like to DC the medication at this time. She will continue receiving her Klonopin for her PCP.  1. Moderate episode of recurrent major depressive disorder (HCC)  Increased- venlafaxine XR (EFFEXOR-XR) 37.5 MG 24 hr capsule; Take 1 capsule (37.5 mg total) by mouth daily with breakfast.  Dispense: 30 capsule; Refill: 1  2. Hyposomnia, insomnia or sleeplessness associated with anxiety  Increased- zolpidem (AMBIEN) 10 MG tablet; TAKE ONE TABLET BY MOUTH EVERY NIGHT AT BEDTIME AS NEEDED FOR SLEEP  Dispense: 10 tablet; Refill: 0    Follow-up in 2  months     Salley Slaughter, NP 7/20/202111:39 AM

## 2019-09-04 ENCOUNTER — Encounter: Payer: Self-pay | Admitting: Osteopathic Medicine

## 2019-09-04 DIAGNOSIS — Z1211 Encounter for screening for malignant neoplasm of colon: Secondary | ICD-10-CM

## 2019-09-08 ENCOUNTER — Other Ambulatory Visit: Payer: Self-pay | Admitting: Osteopathic Medicine

## 2019-09-08 DIAGNOSIS — F321 Major depressive disorder, single episode, moderate: Secondary | ICD-10-CM

## 2019-09-08 DIAGNOSIS — I10 Essential (primary) hypertension: Secondary | ICD-10-CM

## 2019-09-09 ENCOUNTER — Other Ambulatory Visit: Payer: Self-pay | Admitting: Osteopathic Medicine

## 2019-09-09 DIAGNOSIS — F418 Other specified anxiety disorders: Secondary | ICD-10-CM

## 2019-09-14 ENCOUNTER — Other Ambulatory Visit: Payer: Self-pay | Admitting: Osteopathic Medicine

## 2019-09-14 DIAGNOSIS — F321 Major depressive disorder, single episode, moderate: Secondary | ICD-10-CM

## 2019-09-23 ENCOUNTER — Encounter: Payer: Self-pay | Admitting: Osteopathic Medicine

## 2019-09-23 ENCOUNTER — Encounter: Payer: Self-pay | Admitting: Physical Therapy

## 2019-09-23 ENCOUNTER — Other Ambulatory Visit: Payer: Self-pay

## 2019-09-23 ENCOUNTER — Ambulatory Visit: Payer: Medicare Other | Attending: Osteopathic Medicine | Admitting: Physical Therapy

## 2019-09-23 ENCOUNTER — Telehealth (INDEPENDENT_AMBULATORY_CARE_PROVIDER_SITE_OTHER): Payer: Medicare Other | Admitting: Osteopathic Medicine

## 2019-09-23 VITALS — Wt 195.0 lb

## 2019-09-23 DIAGNOSIS — G8929 Other chronic pain: Secondary | ICD-10-CM | POA: Insufficient documentation

## 2019-09-23 DIAGNOSIS — R293 Abnormal posture: Secondary | ICD-10-CM | POA: Insufficient documentation

## 2019-09-23 DIAGNOSIS — J0110 Acute frontal sinusitis, unspecified: Secondary | ICD-10-CM | POA: Diagnosis not present

## 2019-09-23 DIAGNOSIS — M545 Low back pain: Secondary | ICD-10-CM | POA: Diagnosis not present

## 2019-09-23 DIAGNOSIS — M6281 Muscle weakness (generalized): Secondary | ICD-10-CM | POA: Diagnosis not present

## 2019-09-23 MED ORDER — AZITHROMYCIN 250 MG PO TABS: ORAL_TABLET | ORAL | 0 refills | Status: DC

## 2019-09-23 NOTE — Therapy (Signed)
[facility] [address] [place], [place], [address] Phone: [phone]   Fax:  [phone]  Physical Therapy Evaluation  Patient Details  Name: [name] MRN: [id number] Date of Birth: 19-May-1945 Referring Provider (PT): Emeterio Reeve, DO   Encounter Date: 09/23/2019   PT End of Session - 09/23/19 1437    Visit Number 1    Date for PT Re-Evaluation 11/18/19    PT Start Time 1101    PT Stop Time 1143    PT Time Calculation (min) 42 min    Activity Tolerance Patient tolerated treatment well    Behavior During Therapy [facility] for tasks assessed/performed           Past Medical History:  Diagnosis Date  . Anxiety   . Depression   . Headache    SINUS  . Hyperlipidemia   . Hypertension   . Osteopenia   . Primary localized osteoarthritis of right knee     Past Surgical History:  Procedure Laterality Date  . BREAST EXCISIONAL BIOPSY Right    benign cyst removal   . CARPAL TUNNEL RELEASE Right 2015   Ortho in [place]  . LASIK    . MENISCUS REPAIR Right 2011   Orthopedic in [place]  . TOTAL KNEE ARTHROPLASTY Right 09/20/2015  . TOTAL KNEE ARTHROPLASTY Right 09/20/2015   Procedure: TOTAL KNEE ARTHROPLASTY;  Surgeon: Elsie Saas, MD;  Location: [place];  Service: Orthopedics;  Laterality: Right;  . Trigger Thumb Right 2015   Done in [facility] at the same time as the carpal tunnel release  . TUBAL LIGATION  1980    There were no vitals filed for this visit.    Subjective Assessment - 09/23/19 1103    Subjective Pt states she mostly walks around the house and in stores.  Pt states she was walking daily but it has been about a year. Pt states that losing the stomach would help with the pain because it feels like a lot because it pulls on the back.    Pertinent History Rt TKA    How long can you sit comfortably? no pain    How long can you stand comfortably? 30-45 min    How long can you walk  comfortably? 30-45 min    Patient Stated Goals be able to walk more and know how to strengthen and stretch to reduce    Currently in Pain? Yes    Pain Score 6     Pain Location Back    Pain Orientation Lower;Right;Left    Pain Descriptors / Indicators Tightness;Aching    Pain Onset More than a month ago    Pain Frequency Intermittent    Aggravating Factors  walking or doing a lot of house work    Pain Relieving Factors sitting    Multiple Pain Sites No              [facility] PT Assessment - 09/23/19 0001      Assessment   Medical Diagnosis M79.606 (ICD-10-CM) - Pain of lower extremity, unspecified laterality;M54.5,G89.29 (ICD-10-CM) - Chronic bilateral low back pain without sciatica    Referring Provider (PT) Emeterio Reeve, DO      Precautions   Precautions None      Restrictions   Weight Bearing Restrictions No      Balance Screen   Has the patient fallen in the past 6 months No      [place] residence  Living Arrangements Spouse/significant other      Prior Function   Level of Independence Independent    Vocation Retired      Associate Professor   Overall Cognitive Status Within Functional Limits for tasks assessed      Observation/Other Assessments   Focus on Therapeutic Outcomes (FOTO)  53% limited      Posture/Postural Control   Posture/Postural Control Postural limitations    Postural Limitations Rounded Shoulders;Forward head;Increased thoracic kyphosis;Anterior pelvic tilt      ROM / Strength   AROM / PROM / Strength AROM;Strength;PROM      AROM   Overall AROM Comments lumbar flexion 75%       PROM   Overall PROM Comments hip IR 50% limited Rt      Strength   Overall Strength Comments hip abduction 4/5 bil      Flexibility   Soft Tissue Assessment /Muscle Length yes    Hamstrings Lt 75%; Rt 85%      Palpation   Palpation comment lumbar erectors and gluteals tight bil      Special Tests   Other special tests  ASLR difficulty stabilizing pelvis      Ambulation/Gait   Gait Pattern Decreased stride length                      Objective measurements completed on examination: See above findings.               PT Education - 09/23/19 1447    Education Details Access Code: [email or web address]    Person(s) Educated Patient    Methods Explanation;Demonstration;Verbal cues;Handout    Comprehension Verbalized understanding;Returned demonstration            PT Short Term Goals - 09/23/19 1603      PT SHORT TERM GOAL #1   Title STG=LTG             PT Long Term Goals - 09/23/19 1603      PT LONG TERM GOAL #1   Title Pt will be ind with advnaced HEP    Time 8    Period Weeks    Status New    Target Date 11/18/19      PT LONG TERM GOAL #2   Title Pt will report she can go for 10 min walk in the neighborhood without increased pain    Time 8    Period Weeks    Status New    Target Date 11/18/19      PT LONG TERM GOAL #3   Title Pt will be able to feel confident to increase her walking distance as part of her healthy routine    Time 8    Period Weeks    Status New    Target Date 11/18/19      PT LONG TERM GOAL #4   Title Pt will report overall 50% less pain    Time 8    Period Weeks    Status New    Target Date 11/18/19                  Plan - 09/23/19 1552    Clinical Impression Statement Pt presented to clinic today due to chronic back pain.  It was feeling better but has returned.  Pt reports it is even worse now due to gaining weight around her midsection.  Pt feels better with lumbar flexion stretches.  pt has tension in lumbar fascia and extensor and gluteal  muscles bil.  pt demonstrates some hip weakness bil as mentioned above.  She has posture abnormalities as mentioned.  Pt has the goal of stretching and strengthening at home so she can return to walking without pain for weight management.  pt will benefit from skilled PT to address these  impairments and return to healthy activities.    Personal Factors and Comorbidities Fitness    Examination-Activity Limitations Stand    Examination-Participation Restrictions Community Activity;Cleaning    Stability/Clinical Decision Making Stable/Uncomplicated    Clinical Decision Making Low    Rehab Potential Excellent    PT Frequency 2x / week    PT Duration 8 weeks    PT Treatment/Interventions ADLs/Self Care Home Management;Biofeedback;Cryotherapy;Electrical Stimulation;Moist Heat;Traction;Therapeutic activities;Neuromuscular re-education;Therapeutic exercise;Patient/family education;Manual techniques;Passive range of motion;Dry needling;Taping    PT Next Visit Plan review intitial HEP, lumbar and gluteal STM and ROM    PT Home Exercise Plan Access Code: [email or web address]    Consulted and Agree with Plan of Care Patient           Patient will benefit from skilled therapeutic intervention in order to improve the following deficits and impairments:  Difficulty walking, Increased muscle spasms, Pain, Postural dysfunction, Impaired flexibility, Increased fascial restricitons, Decreased strength  Visit Diagnosis: Abnormal posture  Muscle weakness (generalized)  Chronic low back pain, unspecified back pain laterality, unspecified whether sciatica present     Problem List Patient Active Problem List   Diagnosis Date Noted  . Moderate episode of recurrent major depressive disorder ([place]) 09/02/2019  . Generalized anxiety disorder 09/02/2019  . Anxiety disorder 08/16/2019  . Primary insomnia 08/16/2019  . Trigger finger, left ring finger 08/08/2019  . Post-menopausal 05/14/2017  . Prediabetes 01/12/2017  . Snoring 07/20/2016  . Obsessive-compulsive personality trait 12/07/2015  . Morbid obesity ([place]) 09/19/2015  . Primary localized osteoarthritis of right knee   . Cardiac risk counseling 08/16/2015  . Essential hypertension 08/04/2015  . Hyperlipidemia 08/04/2015  . Osteopenia  08/04/2015  . Anxiety associated with depression 08/04/2015  . Right knee pain 06/14/2015  . Hyposomnia, insomnia or sleeplessness associated with anxiety 06/14/2015    Jule Ser, PT 09/23/2019, 4:15 PM  [facility] [address], [place], [place], [address] Phone: [phone]   Fax:  [phone]  Name: [name] MRN: [id number] Date of Birth: 1945/02/29

## 2019-09-23 NOTE — Patient Instructions (Addendum)
Constipation:   Basic lifestyle modifications: hydration and exercise!   High fiber diet and/or fiber supplementation with Metamucil, Citrucel or similar   MiraLax daily if needed   Senokot stimulat laxative 1-2 times per week, AVOID daily use   Sinus  Antibiotics sent   Let us know if no better

## 2019-09-23 NOTE — Patient Instructions (Signed)
Access Code: [email or web address] URL: [email or web address] Date: 09/23/2019 Prepared by: Jari Favre  Exercises Supine Figure 4 Piriformis Stretch - 1 x daily - 7 x weekly - 3 reps - 1 sets - 30 sec hold Seated Hamstring Stretch - 1 x daily - 7 x weekly - 3 reps - 1 sets - 30 sec hold Standing Lumbar Spine Flexion Stretch Counter - 1 x daily - 7 x weekly - 5 reps - 1 sets - 10 hold Seated Flexion Stretch - 1 x daily - 7 x weekly - 3 sets - 10 reps

## 2019-09-23 NOTE — Therapy (Signed)
[facility] [address] [place], [place], [address] Phone: [phone]   Fax:  [phone]  Physical Therapy Evaluation  Patient Details  Name: [name] MRN: [id number] Date of Birth: [birth year] Referring Provider (PT): Emeterio Reeve, DO   Encounter Date: 09/23/2019   PT End of Session - 09/23/19 1437    Visit Number 1    Date for PT Re-Evaluation 11/18/19    PT Start Time 1101    PT Stop Time 1143    PT Time Calculation (min) 42 min    Activity Tolerance Patient tolerated treatment well    Behavior During Therapy [facility] for tasks assessed/performed           Past Medical History:  Diagnosis Date  . Anxiety   . Depression   . Headache    SINUS  . Hyperlipidemia   . Hypertension   . Osteopenia   . Primary localized osteoarthritis of right knee     Past Surgical History:  Procedure Laterality Date  . BREAST EXCISIONAL BIOPSY Right    benign cyst removal   . CARPAL TUNNEL RELEASE Right 2015   Ortho in [place]  . LASIK    . MENISCUS REPAIR Right 2011   Orthopedic in [place]  . TOTAL KNEE ARTHROPLASTY Right 09/20/2015  . TOTAL KNEE ARTHROPLASTY Right 09/20/2015   Procedure: TOTAL KNEE ARTHROPLASTY;  Surgeon: Elsie Saas, MD;  Location: [place];  Service: Orthopedics;  Laterality: Right;  . Trigger Thumb Right 2015   Done in [facility] at the same time as the carpal tunnel release  . TUBAL LIGATION  1980    There were no vitals filed for this visit.    Subjective Assessment - 09/23/19 1103    Subjective Pt states she mostly walks around the house and in stores.  Pt states she was walking daily but it has been about a year. Pt states that losing the stomach would help with the pain because it feels like a lot because it pulls on the back.    Pertinent History Rt TKA    How long can you sit comfortably? no pain    How long can you stand comfortably? 30-45 min    How long can you walk  comfortably? 30-45 min    Patient Stated Goals be able to walk more and know how to strengthen and stretch to reduce    Currently in Pain? Yes    Pain Score 6     Pain Location Back    Pain Orientation Lower;Right;Left    Pain Descriptors / Indicators Tightness;Aching    Pain Onset More than a month ago    Pain Frequency Intermittent    Aggravating Factors  walking or doing a lot of house work    Pain Relieving Factors sitting    Multiple Pain Sites No              [facility] PT Assessment - 09/23/19 0001      Assessment   Medical Diagnosis M79.606 (ICD-10-CM) - Pain of lower extremity, unspecified laterality;M54.5,G89.29 (ICD-10-CM) - Chronic bilateral low back pain without sciatica    Referring Provider (PT) Emeterio Reeve, DO      Precautions   Precautions None      Restrictions   Weight Bearing Restrictions No      Balance Screen   Has the patient fallen in the past 6 months No      [place] residence  Living Arrangements Spouse/significant other      Prior Function   Level of Independence Independent    Vocation Retired      Associate Professor   Overall Cognitive Status Within Functional Limits for tasks assessed                      Objective measurements completed on examination: See above findings.               PT Education - 09/23/19 1447    Education Details Access Code: [email or web address]    Person(s) Educated Patient    Methods Explanation;Demonstration;Verbal cues;Handout    Comprehension Verbalized understanding;Returned demonstration            PT Short Term Goals - 09/23/19 1603      PT SHORT TERM GOAL #1   Title STG=LTG             PT Long Term Goals - 09/23/19 1603      PT LONG TERM GOAL #1   Title Pt will be ind with advnaced HEP    Time 8    Period Weeks    Status New    Target Date 11/18/19      PT LONG TERM GOAL #2   Title Pt will report she can go for 10 min walk in the  neighborhood without increased pain    Time 8    Period Weeks    Status New    Target Date 11/18/19      PT LONG TERM GOAL #3   Title Pt will be able to feel confident to increase her walking distance as part of her healthy routine    Time 8    Period Weeks    Status New    Target Date 11/18/19      PT LONG TERM GOAL #4   Title Pt will report overall 50% less pain    Time 8    Period Weeks    Status New    Target Date 11/18/19                  Plan - 09/23/19 1552    Clinical Impression Statement Pt presented to clinic today due to chronic back pain.  It was feeling better but has returned.  Pt reports it is even worse now due to gaining weight around her midsection.  Pt feels better with lumbar flexion stretches.  pt has tension in lumbar fascia and extensor and gluteal muscles bil.  pt demonstrates some hip weakness bil as mentioned above.  She has posture abnormalities as mentioned.  Pt has the goal of stretching and strengthening at home so she can return to walking without pain for weight management.  pt will benefit from skilled PT to address these impairments and return to healthy activities.    Personal Factors and Comorbidities Fitness    Examination-Activity Limitations Stand    Examination-Participation Restrictions Community Activity;Cleaning    Stability/Clinical Decision Making Stable/Uncomplicated    Clinical Decision Making Low    Rehab Potential Excellent    PT Frequency 2x / week    PT Duration 8 weeks    PT Treatment/Interventions ADLs/Self Care Home Management;Biofeedback;Cryotherapy;Electrical Stimulation;Moist Heat;Traction;Therapeutic activities;Neuromuscular re-education;Therapeutic exercise;Patient/family education;Manual techniques;Passive range of motion;Dry needling;Taping    PT Next Visit Plan review intitial HEP, lumbar and gluteal STM and ROM    PT Home Exercise Plan Access Code: [email or web address]    Consulted and Agree with Plan of Care Patient  Patient will benefit from skilled therapeutic intervention in order to improve the following deficits and impairments:  Difficulty walking, Increased muscle spasms, Pain, Postural dysfunction, Impaired flexibility, Increased fascial restricitons, Decreased strength  Visit Diagnosis: Abnormal posture  Muscle weakness (generalized)  Chronic low back pain, unspecified back pain laterality, unspecified whether sciatica present     Problem List Patient Active Problem List   Diagnosis Date Noted  . Moderate episode of recurrent major depressive disorder ([place]) 09/02/2019  . Generalized anxiety disorder 09/02/2019  . Anxiety disorder 08/16/2019  . Primary insomnia 08/16/2019  . Trigger finger, left ring finger 08/08/2019  . Post-menopausal 05/14/2017  . Prediabetes 01/12/2017  . Snoring 07/20/2016  . Obsessive-compulsive personality trait 12/07/2015  . Morbid obesity ([place]) 09/19/2015  . Primary localized osteoarthritis of right knee   . Cardiac risk counseling 08/16/2015  . Essential hypertension 08/04/2015  . Hyperlipidemia 08/04/2015  . Osteopenia 08/04/2015  . Anxiety associated with depression 08/04/2015  . Right knee pain 06/14/2015  . Hyposomnia, insomnia or sleeplessness associated with anxiety 06/14/2015    Jule Ser, PT 09/23/2019, 4:07 PM  [facility] [address] [place], [place], [address] Phone: [phone]   Fax:  [phone]  Name: [name] MRN: [id number] Date of Birth: 01-24-1946

## 2019-09-23 NOTE — Addendum Note (Signed)
Addended by: Su Hoff on: 09/23/2019 04:16 PM   Modules accepted: Orders

## 2019-09-23 NOTE — Progress Notes (Signed)
Virtual Visit via Video (App used: MyChart) Note  I connected with      [name] on 37/[birth year] at 2:49 PM  by a telemedicine application and verified that I am speaking with the correct person using two identifiers.  Patient is at home I am in office   I discussed the limitations of evaluation and management by telemedicine and the availability of in person appointments. The patient expressed understanding and agreed to proceed.  History of Present Illness: [name] is a 74 y.o. female who would like to discuss sinus problem and GI issues - constipation since on Ozempic   Sinus problem x2.5 weeks Pressure around eyes Hx recurrent sinus infections, was referred to ENT in the past  Has had few doses Keflex at dentist recently over the past few months       Observations/Objective: Wt 195 lb (88.5 kg)   BMI 35.67 kg/m  BP Readings from Last 3 Encounters:  06/30/19 136/75  05/20/19 (!) 171/81  07/17/18 (!) 148/80   Exam: Normal Speech.  NAD  Lab and Radiology Results No results found for this or any previous visit (from the past 72 hour(s)). No results found.     Assessment and Plan: 74 y.o. female with The encounter diagnosis was Subacute frontal sinusitis.   PDMP not reviewed this encounter. No orders of the defined types were placed in this encounter.  Meds ordered this encounter  Medications  . azithromycin (ZITHROMAX) 250 MG tablet    Sig: 2 tabs po x1 on Day 1, then 1 tab po daily on Days 2 - 5    Dispense:  6 tablet    Refill:  0   Patient Instructions  Constipation:   Basic lifestyle modifications: hydration and exercise!   High fiber diet and/or fiber supplementation with Metamucil, Citrucel or similar   MiraLax daily if needed   Senokot stimulat laxative 1-2 times per week, AVOID daily use   Sinus  Antibiotics sent   Let us know if no better      Instructions sent via MyChart. If MyChart not available, pt was  given option for info via personal e-mail w/ no guarantee of protected health info over unsecured e-mail communication, and MyChart sign-up instructions were sent to patient.   Follow Up Instructions: Return if symptoms worsen or fail to improve.    I discussed the assessment and treatment plan with the patient. The patient was provided an opportunity to ask questions and all were answered. The patient agreed with the plan and demonstrated an understanding of the instructions.   The patient was advised to call back or seek an in-person evaluation if any new concerns, if symptoms worsen or if the condition fails to improve as anticipated.  30 minutes of non-face-to-face time was provided during this encounter.      . . . . . . . . . . . . . Marland Kitchen                   Historical information moved to improve visibility of documentation.  Past Medical History:  Diagnosis Date  . Anxiety   . Depression   . Headache    SINUS  . Hyperlipidemia   . Hypertension   . Osteopenia   . Primary localized osteoarthritis of right knee    Past Surgical History:  Procedure Laterality Date  . BREAST EXCISIONAL BIOPSY Right    benign cyst removal   . CARPAL TUNNEL RELEASE  Right 2015   Ortho in [place]  . LASIK    . MENISCUS REPAIR Right 2011   Orthopedic in [place]  . TOTAL KNEE ARTHROPLASTY Right 09/20/2015  . TOTAL KNEE ARTHROPLASTY Right 09/20/2015   Procedure: TOTAL KNEE ARTHROPLASTY;  Surgeon: Elsie Saas, MD;  Location: [facility];  Service: Orthopedics;  Laterality: Right;  . Trigger Thumb Right 2015   Done in [facility] at the same time as the carpal tunnel release  . TUBAL LIGATION  1980   Social History   Tobacco Use  . Smoking status: Never Smoker  . Smokeless tobacco: Never Used  Substance Use Topics  . Alcohol use: No    Comment: 1 glass a month   family history includes Alcohol abuse in her maternal aunt; Heart attack in her father and mother; Heart  disease in her brother; Heart failure in her mother; Hypertension in her sister; Irregular heart beat in her sister.  Medications: Current Outpatient Medications  Medication Sig Dispense Refill  . amLODipine-benazepril (LOTREL) 5-20 MG capsule TAKE ONE CAPSULE BY MOUTH DAILY 30 capsule 0  . atorvastatin (LIPITOR) 20 MG tablet TAKE ONE TABLET BY MOUTH DAILY **OFFICE VISIT NEEDED FOR ADDITIONAL REFILLS** 90 tablet 3  . clindamycin (CLEOCIN) 300 MG capsule Take 300 mg by mouth 2 (two) times daily.    . clonazePAM (KLONOPIN) 0.5 MG tablet TAKE 1/2 TO 1 TABLET BY MOUTH TWO TIMES A DAY AS NEEDED FOR ANXIETY (DO NOT TAKE WITH TEMAZEPAM OR AMBIEN) 60 tablet 0  . fluticasone (FLONASE) 50 MCG/ACT nasal spray Place into both nostrils daily.    . raloxifene (EVISTA) 60 MG tablet TAKE ONE TABLET BY MOUTH DAILY 90 tablet 3  . triamterene-hydrochlorothiazide (DYAZIDE) 37.5-25 MG capsule TAKE ONE CAPSULE BY MOUTH DAILY 90 capsule 0  . venlafaxine XR (EFFEXOR-XR) 150 MG 24 hr capsule Take 1 capsule (150 mg total) by mouth daily with breakfast. 90 capsule 1  . venlafaxine XR (EFFEXOR-XR) 37.5 MG 24 hr capsule Take 1 capsule (37.5 mg total) by mouth daily with breakfast. 30 capsule 1  . zolpidem (AMBIEN) 10 MG tablet TAKE ONE TABLET BY MOUTH EVERY NIGHT AT BEDTIME AS NEEDED FOR SLEEP 10 tablet 0  . atorvastatin (LIPITOR) 20 MG tablet Take 1 tablet (20 mg total) by mouth daily. 90 tablet 1  . azithromycin (ZITHROMAX) 250 MG tablet 2 tabs po x1 on Day 1, then 1 tab po daily on Days 2 - 5 6 tablet 0  . QUEtiapine (SEROQUEL) 25 MG tablet Take 1 tablet (25 mg total) by mouth at bedtime. 30 tablet 0   No current facility-administered medications for this visit.   Allergies  Allergen Reactions  . Penicillins Hives    Tolerates Cephalosporins   . Sulfa Antibiotics Hives

## 2019-09-29 DIAGNOSIS — H25013 Cortical age-related cataract, bilateral: Secondary | ICD-10-CM | POA: Diagnosis not present

## 2019-09-29 DIAGNOSIS — H2513 Age-related nuclear cataract, bilateral: Secondary | ICD-10-CM | POA: Diagnosis not present

## 2019-09-29 DIAGNOSIS — H524 Presbyopia: Secondary | ICD-10-CM | POA: Diagnosis not present

## 2019-09-30 ENCOUNTER — Encounter: Payer: Medicare Other | Admitting: Physical Therapy

## 2019-10-02 ENCOUNTER — Encounter: Payer: Self-pay | Admitting: Physical Therapy

## 2019-10-02 ENCOUNTER — Other Ambulatory Visit: Payer: Self-pay

## 2019-10-02 ENCOUNTER — Ambulatory Visit: Payer: Medicare Other | Admitting: Physical Therapy

## 2019-10-02 DIAGNOSIS — M6281 Muscle weakness (generalized): Secondary | ICD-10-CM

## 2019-10-02 DIAGNOSIS — G8929 Other chronic pain: Secondary | ICD-10-CM | POA: Diagnosis not present

## 2019-10-02 DIAGNOSIS — M545 Low back pain, unspecified: Secondary | ICD-10-CM

## 2019-10-02 DIAGNOSIS — R293 Abnormal posture: Secondary | ICD-10-CM

## 2019-10-02 NOTE — Therapy (Signed)
[facility] [address] [place], [place], [address] Phone: [phone]   Fax:  [phone]  Physical Therapy Treatment  Patient Details  Name: [name] MRN: [id number] Date of Birth: 1945-03-21 Referring Provider (PT): Emeterio Reeve, DO   Encounter Date: 10/02/2019   PT End of Session - 10/02/19 0931    Visit Number 2    Date for PT Re-Evaluation 11/18/19    PT Start Time 0928    PT Stop Time 1008    PT Time Calculation (min) 40 min    Activity Tolerance Patient tolerated treatment well    Behavior During Therapy [facility] for tasks assessed/performed           Past Medical History:  Diagnosis Date  . Anxiety   . Depression   . Headache    SINUS  . Hyperlipidemia   . Hypertension   . Osteopenia   . Primary localized osteoarthritis of right knee     Past Surgical History:  Procedure Laterality Date  . BREAST EXCISIONAL BIOPSY Right    benign cyst removal   . CARPAL TUNNEL RELEASE Right 2015   Ortho in [place]  . LASIK    . MENISCUS REPAIR Right 2011   Orthopedic in [place]  . TOTAL KNEE ARTHROPLASTY Right 09/20/2015  . TOTAL KNEE ARTHROPLASTY Right 09/20/2015   Procedure: TOTAL KNEE ARTHROPLASTY;  Surgeon: Elsie Saas, MD;  Location: [facility];  Service: Orthopedics;  Laterality: Right;  . Trigger Thumb Right 2015   Done in [facility] at the same time as the carpal tunnel release  . TUBAL LIGATION  1980    There were no vitals filed for this visit.   Subjective Assessment - 10/02/19 0929    Subjective Pt states have been doing    Patient Stated Goals be able to walk more and know how to strengthen and stretch to reduce    Currently in Pain? Yes    Pain Score 3     Pain Location Back    Pain Orientation Right;Left;Lower    Pain Descriptors / Indicators Tightness;Aching    Pain Type Chronic pain    Pain Onset More than a month ago    Pain Frequency Intermittent    Multiple Pain Sites  No                             [facility] Adult PT Treatment/Exercise - 10/02/19 0001      Exercises   Exercises Lumbar      Lumbar Exercises: Stretches   Active Hamstring Stretch Right;Left;1 rep;20 seconds      Lumbar Exercises: Seated   Long Arc Quad on Chair Strengthening;Both;20 reps    Hip Flexion on Ball Strengthening;20 reps    Other Seated Lumbar Exercises ball squeeze - 20x      Lumbar Exercises: Supine   Other Supine Lumbar Exercises leg lengthening - 10 x 5 sec      Manual Therapy   Manual Therapy Soft tissue mobilization    Soft tissue mobilization bil gluteals and lumbar                  PT Education - 10/02/19 1007    Education Details Access Code: [email or web address]    Methods Explanation;Demonstration;Verbal cues;Handout;Tactile cues    Comprehension Verbalized understanding;Returned demonstration            PT Short Term Goals - 09/23/19 1603  PT SHORT TERM GOAL #1   Title STG=LTG             PT Long Term Goals - 09/23/19 1603      PT LONG TERM GOAL #1   Title Pt will be ind with advnaced HEP    Time 8    Period Weeks    Status New    Target Date 11/18/19      PT LONG TERM GOAL #2   Title Pt will report she can go for 10 min walk in the neighborhood without increased pain    Time 8    Period Weeks    Status New    Target Date 11/18/19      PT LONG TERM GOAL #3   Title Pt will be able to feel confident to increase her walking distance as part of her healthy routine    Time 8    Period Weeks    Status New    Target Date 11/18/19      PT LONG TERM GOAL #4   Title Pt will report overall 50% less pain    Time 8    Period Weeks    Status New    Target Date 11/18/19                 Plan - 10/02/19 1012    Clinical Impression Statement Pt did well with initial HEP.  She responded well to exercises today with cues to activate core.  Pt will benefit from skilled PT to continue working on core strength    PT  Treatment/Interventions ADLs/Self Care Home Management;Biofeedback;Cryotherapy;Electrical Stimulation;Moist Heat;Traction;Therapeutic activities;Neuromuscular re-education;Therapeutic exercise;Patient/family education;Manual techniques;Passive range of motion;Dry needling;Taping    PT Next Visit Plan HEP, STM to gluteal and lumbar if needed; progress lumbar and hip ROM and stretch and core strength; posture    PT Home Exercise Plan Access Code: [email or web address]    Consulted and Agree with Plan of Care Patient           Patient will benefit from skilled therapeutic intervention in order to improve the following deficits and impairments:  Difficulty walking, Increased muscle spasms, Pain, Postural dysfunction, Impaired flexibility, Increased fascial restricitons, Decreased strength  Visit Diagnosis: Abnormal posture  Muscle weakness (generalized)  Chronic low back pain, unspecified back pain laterality, unspecified whether sciatica present     Problem List Patient Active Problem List   Diagnosis Date Noted  . Moderate episode of recurrent major depressive disorder ([place]) 09/02/2019  . Generalized anxiety disorder 09/02/2019  . Anxiety disorder 08/16/2019  . Primary insomnia 08/16/2019  . Trigger finger, left ring finger 08/08/2019  . Post-menopausal 05/14/2017  . Prediabetes 01/12/2017  . Snoring 07/20/2016  . Obsessive-compulsive personality trait 12/07/2015  . Morbid obesity ([place]) 09/19/2015  . Primary localized osteoarthritis of right knee   . Cardiac risk counseling 08/16/2015  . Essential hypertension 08/04/2015  . Hyperlipidemia 08/04/2015  . Osteopenia 08/04/2015  . Anxiety associated with depression 08/04/2015  . Right knee pain 06/14/2015  . Hyposomnia, insomnia or sleeplessness associated with anxiety 06/14/2015    Jule Ser, PT 10/02/2019, 11:09 AM  [facility] [address] [place], [place],  [address] Phone: [phone]   Fax:  [phone]  Name: [name] MRN: [id number] Date of Birth: [birth year]

## 2019-10-02 NOTE — Patient Instructions (Signed)
Access Code: [email or web address] URL: [email or web address] Date: 10/02/2019 Prepared by: Jari Favre  Exercises Supine Figure 4 Piriformis Stretch - 1 x daily - 7 x weekly - 3 reps - 1 sets - 30 sec hold Seated Hamstring Stretch - 1 x daily - 7 x weekly - 3 reps - 1 sets - 30 sec hold Standing Lumbar Spine Flexion Stretch Counter - 1 x daily - 7 x weekly - 5 reps - 1 sets - 10 hold Seated Flexion Stretch - 1 x daily - 7 x weekly - 3 sets - 10 reps Seated Hip Adduction Isometrics with Ball - 1 x daily - 7 x weekly - 3 sets - 10 reps Seated Long Arc Quad - 1 x daily - 7 x weekly - 3 sets - 10 reps Seated Hip Abduction with Resistance - 1 x daily - 7 x weekly - 3 sets - 10 reps Supine Quad Set - 1 x daily - 7 x weekly - 1 sets - 10 reps - 5 hold Hooklying Small March - 1 x daily - 7 x weekly - 10 reps - 2 sets

## 2019-10-07 ENCOUNTER — Ambulatory Visit: Payer: Medicare Other | Admitting: Physical Therapy

## 2019-10-07 ENCOUNTER — Encounter: Payer: Self-pay | Admitting: Physical Therapy

## 2019-10-07 ENCOUNTER — Other Ambulatory Visit: Payer: Self-pay

## 2019-10-07 DIAGNOSIS — G8929 Other chronic pain: Secondary | ICD-10-CM

## 2019-10-07 DIAGNOSIS — M6281 Muscle weakness (generalized): Secondary | ICD-10-CM

## 2019-10-07 DIAGNOSIS — R293 Abnormal posture: Secondary | ICD-10-CM | POA: Diagnosis not present

## 2019-10-07 DIAGNOSIS — M545 Low back pain, unspecified: Secondary | ICD-10-CM

## 2019-10-07 NOTE — Therapy (Signed)
[facility] [address], [place], [place], [address] Phone: [phone]   Fax:  [phone]  Physical Therapy Treatment  Patient Details  Name: [name] MRN: [id number] Date of Birth: 1945-07-07 Referring Provider (PT): Emeterio Reeve, DO   Encounter Date: 10/07/2019   PT End of Session - 10/07/19 1109    Visit Number 3    Date for PT Re-Evaluation 11/18/19    PT Start Time 1103    PT Stop Time 1143    PT Time Calculation (min) 40 min    Activity Tolerance Patient tolerated treatment well    Behavior During Therapy [facility] for tasks assessed/performed           Past Medical History:  Diagnosis Date  . Anxiety   . Depression   . Headache    SINUS  . Hyperlipidemia   . Hypertension   . Osteopenia   . Primary localized osteoarthritis of right knee     Past Surgical History:  Procedure Laterality Date  . BREAST EXCISIONAL BIOPSY Right    benign cyst removal   . CARPAL TUNNEL RELEASE Right 2015   Ortho in [place]  . LASIK    . MENISCUS REPAIR Right 2011   Orthopedic in [place]  . TOTAL KNEE ARTHROPLASTY Right 09/20/2015  . TOTAL KNEE ARTHROPLASTY Right 09/20/2015   Procedure: TOTAL KNEE ARTHROPLASTY;  Surgeon: Elsie Saas, MD;  Location: [place];  Service: Orthopedics;  Laterality: Right;  . Trigger Thumb Right 2015   Done in [facility] at the same time as the carpal tunnel release  . TUBAL LIGATION  1980    There were no vitals filed for this visit.   Subjective Assessment - 10/07/19 1106    Subjective Pt states she has been doing better.  Pt denies pain currently just feels it more at the end of the day.    Patient Stated Goals be able to walk more and know how to strengthen and stretch to reduce    Currently in Pain? No/denies                             [facility] Adult PT Treatment/Exercise - 10/07/19 0001      Neuro Re-ed    Neuro Re-ed Details  TrA activation       Lumbar Exercises: Stretches   Active Hamstring Stretch Right;Left;1 rep;20 seconds      Lumbar Exercises: Aerobic   UBE (Upper Arm Bike) L1 3/3      Lumbar Exercises: Standing   Row Strengthening;20 reps;Theraband    Row Limitations green band    Shoulder Extension Strengthening;Theraband;20 reps    Shoulder Extension Limitations red band    Other Standing Lumbar Exercises march and hip abduction 10x each side    Other Standing Lumbar Exercises yellow band hold at sides      Lumbar Exercises: Seated   Sit to Stand 10 reps   elevated mat table     Lumbar Exercises: Supine   Ab Set 10 reps    Clam 20 reps    Clam Limitations yellow loop    Bent Knee Raise 20 reps    Bent Knee Raise Limitations yellow loop                    PT Short Term Goals - 09/23/19 1603      PT SHORT TERM GOAL #1   Title STG=LTG  PT Long Term Goals - 09/23/19 1603      PT LONG TERM GOAL #1   Title Pt will be ind with advnaced HEP    Time 8    Period Weeks    Status New    Target Date 11/18/19      PT LONG TERM GOAL #2   Title Pt will report she can go for 10 min walk in the neighborhood without increased pain    Time 8    Period Weeks    Status New    Target Date 11/18/19      PT LONG TERM GOAL #3   Title Pt will be able to feel confident to increase her walking distance as part of her healthy routine    Time 8    Period Weeks    Status New    Target Date 11/18/19      PT LONG TERM GOAL #4   Title Pt will report overall 50% less pain    Time 8    Period Weeks    Status New    Target Date 11/18/19                 Plan - 10/07/19 1723    Clinical Impression Statement Pt states she is feeling better and stronger. She was able to progress exercises today. Pt needs cues throughout to activate core and keep pelvis neutral.  She will benefit from skilled PT to continue to progress core strength    PT Treatment/Interventions ADLs/Self Care Home  Management;Biofeedback;Cryotherapy;Electrical Stimulation;Moist Heat;Traction;Therapeutic activities;Neuromuscular re-education;Therapeutic exercise;Patient/family education;Manual techniques;Passive range of motion;Dry needling;Taping    PT Next Visit Plan HEP, STM to gluteal and lumbar if needed; progress lumbar and hip ROM and stretch and core strength; posture    PT Home Exercise Plan Access Code: [email or web address]    Consulted and Agree with Plan of Care Patient           Patient will benefit from skilled therapeutic intervention in order to improve the following deficits and impairments:  Difficulty walking, Increased muscle spasms, Pain, Postural dysfunction, Impaired flexibility, Increased fascial restricitons, Decreased strength  Visit Diagnosis: Abnormal posture  Muscle weakness (generalized)  Chronic low back pain, unspecified back pain laterality, unspecified whether sciatica present     Problem List Patient Active Problem List   Diagnosis Date Noted  . Moderate episode of recurrent major depressive disorder ([place]) 09/02/2019  . Generalized anxiety disorder 09/02/2019  . Anxiety disorder 08/16/2019  . Primary insomnia 08/16/2019  . Trigger finger, left ring finger 08/08/2019  . Post-menopausal 05/14/2017  . Prediabetes 01/12/2017  . Snoring 07/20/2016  . Obsessive-compulsive personality trait 12/07/2015  . Morbid obesity ([place]) 09/19/2015  . Primary localized osteoarthritis of right knee   . Cardiac risk counseling 08/16/2015  . Essential hypertension 08/04/2015  . Hyperlipidemia 08/04/2015  . Osteopenia 08/04/2015  . Anxiety associated with depression 08/04/2015  . Right knee pain 06/14/2015  . Hyposomnia, insomnia or sleeplessness associated with anxiety 06/14/2015    Jule Ser, PT 10/07/2019, 5:28 PM  [facility] [address] [place], [place], [address] Phone: [phone]   Fax:   [phone]  Name: [name] MRN: [id number] Date of Birth: March 19, 1945

## 2019-10-08 ENCOUNTER — Other Ambulatory Visit (HOSPITAL_COMMUNITY): Payer: Self-pay | Admitting: Psychiatry

## 2019-10-08 ENCOUNTER — Other Ambulatory Visit: Payer: Self-pay | Admitting: Osteopathic Medicine

## 2019-10-08 DIAGNOSIS — I10 Essential (primary) hypertension: Secondary | ICD-10-CM

## 2019-10-08 DIAGNOSIS — F418 Other specified anxiety disorders: Secondary | ICD-10-CM

## 2019-10-08 DIAGNOSIS — F331 Major depressive disorder, recurrent, moderate: Secondary | ICD-10-CM

## 2019-10-08 NOTE — Telephone Encounter (Signed)
Medication refill request  Last Refill 09/10/2019 Last Ov- 06/30/2019

## 2019-10-09 ENCOUNTER — Encounter: Payer: Self-pay | Admitting: Osteopathic Medicine

## 2019-10-09 DIAGNOSIS — H25013 Cortical age-related cataract, bilateral: Secondary | ICD-10-CM | POA: Diagnosis not present

## 2019-10-09 DIAGNOSIS — E119 Type 2 diabetes mellitus without complications: Secondary | ICD-10-CM | POA: Diagnosis not present

## 2019-10-09 DIAGNOSIS — H2513 Age-related nuclear cataract, bilateral: Secondary | ICD-10-CM | POA: Diagnosis not present

## 2019-10-09 DIAGNOSIS — H04123 Dry eye syndrome of bilateral lacrimal glands: Secondary | ICD-10-CM | POA: Diagnosis not present

## 2019-10-09 LAB — HM DIABETES EYE EXAM

## 2019-10-16 ENCOUNTER — Encounter: Payer: Self-pay | Admitting: Physical Therapy

## 2019-10-16 ENCOUNTER — Other Ambulatory Visit: Payer: Self-pay

## 2019-10-16 ENCOUNTER — Ambulatory Visit: Payer: Medicare Other | Attending: Osteopathic Medicine | Admitting: Physical Therapy

## 2019-10-16 DIAGNOSIS — M545 Low back pain, unspecified: Secondary | ICD-10-CM

## 2019-10-16 DIAGNOSIS — R293 Abnormal posture: Secondary | ICD-10-CM

## 2019-10-16 DIAGNOSIS — G8929 Other chronic pain: Secondary | ICD-10-CM | POA: Diagnosis not present

## 2019-10-16 DIAGNOSIS — M6281 Muscle weakness (generalized): Secondary | ICD-10-CM | POA: Diagnosis not present

## 2019-10-16 NOTE — Therapy (Signed)
[facility] [address] [place], [place], [address] Phone: [phone]   Fax:  [phone]  Physical Therapy Treatment  Patient Details  Name: [name] MRN: [id number] Date of Birth: September 03, 1945 Referring Provider (PT): Emeterio Reeve, DO   Encounter Date: 10/16/2019   PT End of Session - 10/16/19 1312    Visit Number 4    Date for PT Re-Evaluation 11/18/19    PT Start Time 8841    PT Stop Time 1312    PT Time Calculation (min) 41 min    Activity Tolerance Patient tolerated treatment well    Behavior During Therapy [facility] for tasks assessed/performed           Past Medical History:  Diagnosis Date  . Anxiety   . Depression   . Headache    SINUS  . Hyperlipidemia   . Hypertension   . Osteopenia   . Primary localized osteoarthritis of right knee     Past Surgical History:  Procedure Laterality Date  . BREAST EXCISIONAL BIOPSY Right    benign cyst removal   . CARPAL TUNNEL RELEASE Right 2015   Ortho in [place]  . LASIK    . MENISCUS REPAIR Right 2011   Orthopedic in [place]  . TOTAL KNEE ARTHROPLASTY Right 09/20/2015  . TOTAL KNEE ARTHROPLASTY Right 09/20/2015   Procedure: TOTAL KNEE ARTHROPLASTY;  Surgeon: Elsie Saas, MD;  Location: [place];  Service: Orthopedics;  Laterality: Right;  . Trigger Thumb Right 2015   Done in [facility] at the same time as the carpal tunnel release  . TUBAL LIGATION  1980    There were no vitals filed for this visit.   Subjective Assessment - 10/16/19 1236    Subjective Pt states she is feeling better accept when doing housework like vacuuming and lifting thing.  It gets up to 5/10 but taking breaks helps    Patient Stated Goals be able to walk more and know how to strengthen and stretch to reduce    Currently in Pain? No/denies                             [facility] Adult PT Treatment/Exercise - 10/16/19 0001      Lumbar Exercises:  Stretches   Active Hamstring Stretch Right;Left;1 rep;20 seconds      Lumbar Exercises: Aerobic   UBE (Upper Arm Bike) L1 3/3 - PT present and cue for posutre and activation of core    Nustep L3 x 6 min for endurance end of session      Lumbar Exercises: Standing   Other Standing Lumbar Exercises pelvic tilt at thewall    Other Standing Lumbar Exercises hip hinge - was using lumbar for hinging mostly      Lumbar Exercises: Seated   Other Seated Lumbar Exercises fwd flexion stretch      Lumbar Exercises: Supine   Pelvic Tilt 20 reps;5 seconds    Bent Knee Raise 20 reps   with pelvic tilt   Bridge 20 reps    Bridge Limitations pelvic tilt                  PT Education - 10/16/19 1254    Education Details Access Code: [email or web address]    Person(s) Educated Patient    Methods Explanation;Demonstration;Tactile cues;Verbal cues;Handout    Comprehension Verbalized understanding;Returned demonstration            PT Short  Term Goals - 09/23/19 1603      PT SHORT TERM GOAL #1   Title STG=LTG             PT Long Term Goals - 09/23/19 1603      PT LONG TERM GOAL #1   Title Pt will be ind with advnaced HEP    Time 8    Period Weeks    Status New    Target Date 11/18/19      PT LONG TERM GOAL #2   Title Pt will report she can go for 10 min walk in the neighborhood without increased pain    Time 8    Period Weeks    Status New    Target Date 11/18/19      PT LONG TERM GOAL #3   Title Pt will be able to feel confident to increase her walking distance as part of her healthy routine    Time 8    Period Weeks    Status New    Target Date 11/18/19      PT LONG TERM GOAL #4   Title Pt will report overall 50% less pain    Time 8    Period Weeks    Status New    Target Date 11/18/19                 Plan - 10/16/19 1254    Clinical Impression Statement Pt is making steady progress.  Today's session focused on posterior pelvic tilt for improved posture.  She  had a hard time doing this in standing today but was able to progress with supine exercises.  Pt will benefit from skilled PT to continue working on functional movements such as lifting with imporved posture so she can return to functoinal activities with less pain and greater safety due to osteopenia.    PT Treatment/Interventions ADLs/Self Care Home Management;Biofeedback;Cryotherapy;Electrical Stimulation;Moist Heat;Traction;Therapeutic activities;Neuromuscular re-education;Therapeutic exercise;Patient/family education;Manual techniques;Passive range of motion;Dry needling;Taping    PT Next Visit Plan pelvic tilt and hip hinge progressions; HEP, RE-EVAL    PT Home Exercise Plan Access Code: [email or web address]    Consulted and Agree with Plan of Care Patient           Patient will benefit from skilled therapeutic intervention in order to improve the following deficits and impairments:  Difficulty walking, Increased muscle spasms, Pain, Postural dysfunction, Impaired flexibility, Increased fascial restricitons, Decreased strength  Visit Diagnosis: Abnormal posture  Muscle weakness (generalized)  Chronic low back pain, unspecified back pain laterality, unspecified whether sciatica present     Problem List Patient Active Problem List   Diagnosis Date Noted  . Moderate episode of recurrent major depressive disorder ([place]) 09/02/2019  . Generalized anxiety disorder 09/02/2019  . Anxiety disorder 08/16/2019  . Primary insomnia 08/16/2019  . Trigger finger, left ring finger 08/08/2019  . Post-menopausal 05/14/2017  . Prediabetes 01/12/2017  . Snoring 07/20/2016  . Obsessive-compulsive personality trait 12/07/2015  . Morbid obesity ([place]) 09/19/2015  . Primary localized osteoarthritis of right knee   . Cardiac risk counseling 08/16/2015  . Essential hypertension 08/04/2015  . Hyperlipidemia 08/04/2015  . Osteopenia 08/04/2015  . Anxiety associated with depression 08/04/2015  . Right knee pain  06/14/2015  . Hyposomnia, insomnia or sleeplessness associated with anxiety 06/14/2015    Jule Ser, PT 10/16/2019, 1:13 PM  [facility] [address], [place], [place], [address] Phone: [phone]   Fax:  [phone]  Name:  [name] MRN: [id number] Date of Birth: 1945-07-25

## 2019-10-16 NOTE — Patient Instructions (Signed)
Access Code: [email or web address] URL: [email or web address] Date: 10/16/2019 Prepared by: Jari Favre  Exercises Supine Figure 4 Piriformis Stretch - 1 x daily - 7 x weekly - 3 reps - 1 sets - 30 sec hold Seated Hamstring Stretch - 1 x daily - 7 x weekly - 3 reps - 1 sets - 30 sec hold Standing Lumbar Spine Flexion Stretch Counter - 1 x daily - 7 x weekly - 5 reps - 1 sets - 10 hold Seated Flexion Stretch - 1 x daily - 7 x weekly - 3 sets - 10 reps Seated Hip Adduction Isometrics with Ball - 1 x daily - 7 x weekly - 3 sets - 10 reps Seated Long Arc Quad - 1 x daily - 7 x weekly - 3 sets - 10 reps Seated Hip Abduction with Resistance - 1 x daily - 7 x weekly - 3 sets - 10 reps Supine Quad Set - 1 x daily - 7 x weekly - 1 sets - 10 reps - 5 hold Hooklying Small March - 1 x daily - 7 x weekly - 10 reps - 2 sets Supine Lower Trunk Rotation - 1 x daily - 7 x weekly - 3 sets - 10 reps Sit to Stand - 1 x daily - 7 x weekly - 2 sets - 10 reps Shoulder extension with resistance - Neutral - 1 x daily - 7 x weekly - 3 sets - 10 reps Supine Posterior Pelvic Tilt - 1 x daily - 7 x weekly - 3 sets - 10 reps - 5 sec hold

## 2019-10-21 ENCOUNTER — Ambulatory Visit: Payer: Medicare Other | Admitting: Physical Therapy

## 2019-10-21 ENCOUNTER — Encounter: Payer: Self-pay | Admitting: Physical Therapy

## 2019-10-21 ENCOUNTER — Other Ambulatory Visit: Payer: Self-pay

## 2019-10-21 DIAGNOSIS — M545 Low back pain, unspecified: Secondary | ICD-10-CM

## 2019-10-21 DIAGNOSIS — G8929 Other chronic pain: Secondary | ICD-10-CM | POA: Diagnosis not present

## 2019-10-21 DIAGNOSIS — R293 Abnormal posture: Secondary | ICD-10-CM | POA: Diagnosis not present

## 2019-10-21 DIAGNOSIS — M6281 Muscle weakness (generalized): Secondary | ICD-10-CM

## 2019-10-21 NOTE — Therapy (Signed)
[facility] [address] [place], [place], [address] Phone: [phone]   Fax:  [phone]  Physical Therapy Treatment  Patient Details  Name: [name] MRN: [id number] Date of Birth: 12/11/1945 Referring Provider (PT): Emeterio Reeve, DO   Encounter Date: 10/21/2019   PT End of Session - 10/21/19 1021    Visit Number 5    Date for PT Re-Evaluation 11/18/19    PT Start Time 1018    PT Stop Time 1058    PT Time Calculation (min) 40 min    Activity Tolerance Patient tolerated treatment well    Behavior During Therapy [facility] for tasks assessed/performed           Past Medical History:  Diagnosis Date  . Anxiety   . Depression   . Headache    SINUS  . Hyperlipidemia   . Hypertension   . Osteopenia   . Primary localized osteoarthritis of right knee     Past Surgical History:  Procedure Laterality Date  . BREAST EXCISIONAL BIOPSY Right    benign cyst removal   . CARPAL TUNNEL RELEASE Right 2015   Ortho in [place]  . LASIK    . MENISCUS REPAIR Right 2011   Orthopedic in [place]  . TOTAL KNEE ARTHROPLASTY Right 09/20/2015  . TOTAL KNEE ARTHROPLASTY Right 09/20/2015   Procedure: TOTAL KNEE ARTHROPLASTY;  Surgeon: Elsie Saas, MD;  Location: [facility];  Service: Orthopedics;  Laterality: Right;  . Trigger Thumb Right 2015   Done in [facility] at the same time as the carpal tunnel release  . TUBAL LIGATION  1980    There were no vitals filed for this visit.   Subjective Assessment - 10/21/19 1021    Subjective Pt states she walked a few block and the Rt leg got very sore.  No back pain though so that was good, but the Rt hip/groin hurt.  Next time she walked half as much and still a little sore but not as much.    Pertinent History Rt TKA    How long can you sit comfortably? no pain    How long can you walk comfortably? 15 minutes    Patient Stated Goals be able to walk more and know how to  strengthen and stretch to reduce    Currently in Pain? No/denies              [facility] PT Assessment - 10/21/19 0001      Strength   Overall Strength Comments Rt hip 4+/5; Lt hip 4/5 abduction/adduction                         [facility] Adult PT Treatment/Exercise - 10/21/19 0001      Lumbar Exercises: Stretches   Hip Flexor Stretch Right;Left;3 reps;20 seconds      Lumbar Exercises: Aerobic   Nustep L3 x 10 min for endurance PT present for status      Lumbar Exercises: Standing   Other Standing Lumbar Exercises hip flex march and hip abduction - 20x each - cues to activate glutes and stay upright      Lumbar Exercises: Supine   Pelvic Tilt 20 reps;5 seconds    Clam 20 reps    Clam Limitations red band single leg    Other Supine Lumbar Exercises ball squeeze with pelvic tilt adductor strength - 20x  PT Short Term Goals - 09/23/19 1603      PT SHORT TERM GOAL #1   Title STG=LTG             PT Long Term Goals - 10/21/19 1028      PT LONG TERM GOAL #1   Title Pt will be ind with advnaced HEP    Status On-going      PT LONG TERM GOAL #2   Title Pt will report she can go for 10 min walk in the neighborhood without increased pain    Baseline no back pain, 15 min walk. does have hip pain    Status Partially Met      PT LONG TERM GOAL #3   Title Pt will be able to feel confident to increase her walking distance as part of her healthy routine    Baseline not ready due to hip pain    Status On-going      PT LONG TERM GOAL #4   Title Pt will report overall 50% less pain    Baseline 70-80%    Status Achieved                 Plan - 10/21/19 1058    Clinical Impression Statement Pt has made excellent progress with 70-80% less back pain.  She has began walking again but now experiencing some hip/groin pain.  Pt demosntrates hip weakness as mentioned Lt>Rt.  Pt will benefit from skilled PT to progress strength in order to  return to walking program to the point that she feels comfortable  progressing herself back to her 30 minute walks.    PT Treatment/Interventions ADLs/Self Care Home Management;Biofeedback;Cryotherapy;Electrical Stimulation;Moist Heat;Traction;Therapeutic activities;Neuromuscular re-education;Therapeutic exercise;Patient/family education;Manual techniques;Passive range of motion;Dry needling;Taping    PT Next Visit Plan pelvic tilt and hip hinge progressions; HEP, RE-EVAL, d/c likely2-3visits if able to progress walk distance    PT Home Exercise Plan Access Code: [email or web address]    Consulted and Agree with Plan of Care Patient           Patient will benefit from skilled therapeutic intervention in order to improve the following deficits and impairments:  Difficulty walking, Increased muscle spasms, Pain, Postural dysfunction, Impaired flexibility, Increased fascial restricitons, Decreased strength  Visit Diagnosis: Abnormal posture  Muscle weakness (generalized)  Chronic low back pain, unspecified back pain laterality, unspecified whether sciatica present     Problem List Patient Active Problem List   Diagnosis Date Noted  . Moderate episode of recurrent major depressive disorder ([place]) 09/02/2019  . Generalized anxiety disorder 09/02/2019  . Anxiety disorder 08/16/2019  . Primary insomnia 08/16/2019  . Trigger finger, left ring finger 08/08/2019  . Post-menopausal 05/14/2017  . Prediabetes 01/12/2017  . Snoring 07/20/2016  . Obsessive-compulsive personality trait 12/07/2015  . Morbid obesity ([place]) 09/19/2015  . Primary localized osteoarthritis of right knee   . Cardiac risk counseling 08/16/2015  . Essential hypertension 08/04/2015  . Hyperlipidemia 08/04/2015  . Osteopenia 08/04/2015  . Anxiety associated with depression 08/04/2015  . Right knee pain 06/14/2015  . Hyposomnia, insomnia or sleeplessness associated with anxiety 06/14/2015    Jule Ser, PT 10/21/2019, 11:44  AM  [facility] [address] [place], [place], [address] Phone: [phone]   Fax:  [phone]  Name: [name] MRN: [id number] Date of Birth: [birth year]

## 2019-10-21 NOTE — Therapy (Addendum)
[facility] [address], [place], [place], [address] Phone: [phone]   Fax:  [phone]  Physical Therapy Treatment  Patient Details  Name: [name] MRN: [id number] Date of Birth: 1945/05/22 Referring Provider (PT): Emeterio Reeve, DO   Encounter Date: 10/21/2019   PT End of Session - 10/21/19 1021    Visit Number 5    Date for PT Re-Evaluation 11/18/19    PT Start Time 1018    PT Stop Time 1058    PT Time Calculation (min) 40 min    Activity Tolerance Patient tolerated treatment well    Behavior During Therapy [facility] for tasks assessed/performed           Past Medical History:  Diagnosis Date  . Anxiety   . Depression   . Headache    SINUS  . Hyperlipidemia   . Hypertension   . Osteopenia   . Primary localized osteoarthritis of right knee     Past Surgical History:  Procedure Laterality Date  . BREAST EXCISIONAL BIOPSY Right    benign cyst removal   . CARPAL TUNNEL RELEASE Right 2015   Ortho in [place]  . LASIK    . MENISCUS REPAIR Right 2011   Orthopedic in [place]  . TOTAL KNEE ARTHROPLASTY Right 09/20/2015  . TOTAL KNEE ARTHROPLASTY Right 09/20/2015   Procedure: TOTAL KNEE ARTHROPLASTY;  Surgeon: Elsie Saas, MD;  Location: [place];  Service: Orthopedics;  Laterality: Right;  . Trigger Thumb Right 2015   Done in [facility] at the same time as the carpal tunnel release  . TUBAL LIGATION  1980    There were no vitals filed for this visit.   Subjective Assessment - 10/21/19 1021    Subjective Pt states she walked a few block and the Rt leg got very sore.  No back pain though so that was good, but the Rt hip/groin hurt.  Next time she walked half as much and still a little sore but not as much.    Pertinent History Rt TKA    How long can you sit comfortably? no pain    How long can you walk comfortably? 15 minutes    Patient Stated Goals be able to walk more and know how to  strengthen and stretch to reduce    Currently in Pain? No/denies              [facility] PT Assessment - 10/21/19 0001      Observation/Other Assessments   Focus on Therapeutic Outcomes (FOTO)  37% - improved met goal       Strength   Overall Strength Comments Rt hip 4+/5; Lt hip 4/5 abduction/adduction                         [facility] Adult PT Treatment/Exercise - 10/21/19 0001      Lumbar Exercises: Stretches   Hip Flexor Stretch Right;Left;3 reps;20 seconds      Lumbar Exercises: Aerobic   Nustep L3 x 10 min for endurance PT present for status      Lumbar Exercises: Standing   Other Standing Lumbar Exercises hip flex march and hip abduction - 20x each - cues to activate glutes and stay upright      Lumbar Exercises: Supine   Pelvic Tilt 20 reps;5 seconds    Clam 20 reps    Clam Limitations red band single leg    Other Supine Lumbar Exercises ball squeeze  with pelvic tilt adductor strength - 20x                    PT Short Term Goals - 09/23/19 1603      PT SHORT TERM GOAL #1   Title STG=LTG             PT Long Term Goals - 10/21/19 1028      PT LONG TERM GOAL #1   Title Pt will be ind with advnaced HEP    Status On-going      PT LONG TERM GOAL #2   Title Pt will report she can go for 10 min walk in the neighborhood without increased pain    Baseline no back pain, 15 min walk. does have hip pain    Status Partially Met      PT LONG TERM GOAL #3   Title Pt will be able to feel confident to increase her walking distance as part of her healthy routine    Baseline not ready due to hip pain    Status On-going      PT LONG TERM GOAL #4   Title Pt will report overall 50% less pain    Baseline 70-80%    Status Achieved                 Plan - 10/21/19 1058    Clinical Impression Statement Pt has made excellent progress with 70-80% less back pain.  FOTO captured and has met goal of improvement on that measure.  She has began  walking again but now experiencing some hip/groin pain.  Pt demosntrates hip weakness as mentioned Lt>Rt.  Pt will benefit from skilled PT to progress strength in order to return to walking program to the point that she feels comfortable  progressing herself back to her 30 minute walks.    PT Treatment/Interventions ADLs/Self Care Home Management;Biofeedback;Cryotherapy;Electrical Stimulation;Moist Heat;Traction;Therapeutic activities;Neuromuscular re-education;Therapeutic exercise;Patient/family education;Manual techniques;Passive range of motion;Dry needling;Taping    PT Next Visit Plan pelvic tilt and hip hinge progressions; HEP, RE-EVAL, d/c likely2-3visits if able to progress walk distance    PT Home Exercise Plan Access Code: [email or web address]    Consulted and Agree with Plan of Care Patient           Patient will benefit from skilled therapeutic intervention in order to improve the following deficits and impairments:  Difficulty walking, Increased muscle spasms, Pain, Postural dysfunction, Impaired flexibility, Increased fascial restricitons, Decreased strength  Visit Diagnosis: Abnormal posture  Muscle weakness (generalized)  Chronic low back pain, unspecified back pain laterality, unspecified whether sciatica present     Problem List Patient Active Problem List   Diagnosis Date Noted  . Moderate episode of recurrent major depressive disorder ([place]) 09/02/2019  . Generalized anxiety disorder 09/02/2019  . Anxiety disorder 08/16/2019  . Primary insomnia 08/16/2019  . Trigger finger, left ring finger 08/08/2019  . Post-menopausal 05/14/2017  . Prediabetes 01/12/2017  . Snoring 07/20/2016  . Obsessive-compulsive personality trait 12/07/2015  . Morbid obesity ([place]) 09/19/2015  . Primary localized osteoarthritis of right knee   . Cardiac risk counseling 08/16/2015  . Essential hypertension 08/04/2015  . Hyperlipidemia 08/04/2015  . Osteopenia 08/04/2015  . Anxiety associated with  depression 08/04/2015  . Right knee pain 06/14/2015  . Hyposomnia, insomnia or sleeplessness associated with anxiety 06/14/2015    Jule Ser, PT 10/21/2019, 12:01 PM  [facility] [address] [place], [place], [address] Phone: [phone]  Fax:  [phone]  Name: Saran Laviolette MRN: [id number] Date of Birth: [birth year]  PHYSICAL THERAPY DISCHARGE SUMMARY  Visits from Start of Care: 5  Current functional level related to goals / functional outcomes: See above goals   Remaining deficits: See above   Education / Equipment: HEP  Plan: Patient agrees to discharge.  Patient goals were partially met. Patient is being discharged due to the patient's request.  ?????    Gustavus Bryant, PT 12/08/19 9:34 AM

## 2019-10-26 DIAGNOSIS — Z23 Encounter for immunization: Secondary | ICD-10-CM | POA: Diagnosis not present

## 2019-10-28 DIAGNOSIS — Z1211 Encounter for screening for malignant neoplasm of colon: Secondary | ICD-10-CM | POA: Diagnosis not present

## 2019-10-29 DIAGNOSIS — Z9889 Other specified postprocedural states: Secondary | ICD-10-CM | POA: Diagnosis not present

## 2019-10-29 DIAGNOSIS — H2511 Age-related nuclear cataract, right eye: Secondary | ICD-10-CM | POA: Diagnosis not present

## 2019-10-29 DIAGNOSIS — H25811 Combined forms of age-related cataract, right eye: Secondary | ICD-10-CM | POA: Diagnosis not present

## 2019-10-30 ENCOUNTER — Encounter: Payer: Medicare Other | Admitting: Physical Therapy

## 2019-11-03 LAB — COLOGUARD: COLOGUARD: NEGATIVE

## 2019-11-04 ENCOUNTER — Encounter: Payer: Medicare Other | Admitting: Physical Therapy

## 2019-11-04 DIAGNOSIS — H2512 Age-related nuclear cataract, left eye: Secondary | ICD-10-CM | POA: Diagnosis not present

## 2019-11-04 DIAGNOSIS — H25012 Cortical age-related cataract, left eye: Secondary | ICD-10-CM | POA: Diagnosis not present

## 2019-11-05 ENCOUNTER — Ambulatory Visit (HOSPITAL_COMMUNITY): Payer: Medicare Other | Admitting: Psychiatry

## 2019-11-06 ENCOUNTER — Encounter: Payer: Self-pay | Admitting: Osteopathic Medicine

## 2019-11-06 LAB — COLOGUARD: Cologuard: NEGATIVE

## 2019-11-06 NOTE — Progress Notes (Signed)
Patient notified of negative results, voiced her understanding

## 2019-11-07 ENCOUNTER — Other Ambulatory Visit: Payer: Self-pay | Admitting: Osteopathic Medicine

## 2019-11-11 ENCOUNTER — Other Ambulatory Visit: Payer: Self-pay | Admitting: Osteopathic Medicine

## 2019-11-12 DIAGNOSIS — Z9889 Other specified postprocedural states: Secondary | ICD-10-CM | POA: Diagnosis not present

## 2019-11-12 DIAGNOSIS — H2512 Age-related nuclear cataract, left eye: Secondary | ICD-10-CM | POA: Diagnosis not present

## 2019-11-12 DIAGNOSIS — H25012 Cortical age-related cataract, left eye: Secondary | ICD-10-CM | POA: Diagnosis not present

## 2019-11-12 DIAGNOSIS — H25812 Combined forms of age-related cataract, left eye: Secondary | ICD-10-CM | POA: Diagnosis not present

## 2019-11-21 ENCOUNTER — Telehealth (HOSPITAL_COMMUNITY): Payer: Self-pay | Admitting: Psychiatry

## 2019-11-24 NOTE — Telephone Encounter (Signed)
Provider called patient who noted that her sleep has improved since she called. At this time she notes that she would like to continue her medications as prescribed.  She will follow-up with provider on 12/01/2019 for further evaluation.  No other concerns noted at this time.

## 2019-11-26 ENCOUNTER — Ambulatory Visit: Payer: Medicare Other

## 2019-12-01 ENCOUNTER — Ambulatory Visit (INDEPENDENT_AMBULATORY_CARE_PROVIDER_SITE_OTHER): Payer: Medicare Other | Admitting: Psychiatry

## 2019-12-01 ENCOUNTER — Encounter (HOSPITAL_COMMUNITY): Payer: Self-pay | Admitting: Psychiatry

## 2019-12-01 ENCOUNTER — Other Ambulatory Visit: Payer: Self-pay

## 2019-12-01 DIAGNOSIS — F331 Major depressive disorder, recurrent, moderate: Secondary | ICD-10-CM

## 2019-12-01 DIAGNOSIS — F419 Anxiety disorder, unspecified: Secondary | ICD-10-CM | POA: Diagnosis not present

## 2019-12-01 DIAGNOSIS — F5105 Insomnia due to other mental disorder: Secondary | ICD-10-CM | POA: Diagnosis not present

## 2019-12-01 MED ORDER — ZOLPIDEM TARTRATE ER 12.5 MG PO TBCR: 12.5000 mg | EXTENDED_RELEASE_TABLET | Freq: Every evening | ORAL | 1 refills | Status: DC | PRN

## 2019-12-01 MED ORDER — VENLAFAXINE HCL ER 75 MG PO CP24: 75.0000 mg | ORAL_CAPSULE | Freq: Every day | ORAL | 2 refills | Status: DC

## 2019-12-01 MED ORDER — VENLAFAXINE HCL ER 150 MG PO CP24: 150.0000 mg | ORAL_CAPSULE | Freq: Every day | ORAL | 2 refills | Status: DC

## 2019-12-01 NOTE — Progress Notes (Signed)
[place] MD/PA/NP OP Progress Note Virtual Visit via Telephone Note  I connected with [name] on [birth year] at  4:30 PM EDT by telephone and verified that I am speaking with the correct person using two identifiers.  Location: Patient: home Provider: Clinic   I discussed the limitations, risks, security and privacy concerns of performing an evaluation and management service by telephone and the availability of in person appointments. I also discussed with the patient that there may be a patient responsible charge related to this service. The patient expressed understanding and agreed to proceed.   I provided 30 minutes of non-face-to-face time during this encounter.   12/01/2019 3:54 PM [name]  MRN:  [id number]  Chief Complaint:  The Ambien is not working and i'm feeling low"  HPI: 74 year old female seen today for follow up psychiatric evaluation.   She has a psychiatric history of insomnia, anxiety, depression, and passive SI.  She is currently being managed on, Effexor 187.5 mg daily, Klonopin 0.5 twice daily (perscribed by PCP), and Ambien 10 mg (prescribed by PCP).   Patient notes that she would like provider to take over prescribing Ambien.  Provider endorsed understanding and notes that she would prescribe medications.   Today she is pleasant, cooperative, and engaged in conversation.  She describes her mood as depressed and anxious.   Provider conducted a PHQ-9 and patient scored an 11.Patient notes that her anxiety and depression are worsened when she does not sleep.  She informed provider that for the past week she has not been sleeping well.  She notes that when she does not sleep she has passive suicidal ideation.  She denies wanting to hurt herself today and denies SI/HI/VH or paranoia.    Patient notes that Seroquel was ineffective in the past noteing that she did not give it a chance because she stopped it after taking it for 4 days.  She asked provider if  Ambien can be increased or if there is other options.  Patient agreeable to increasing Ambien 10 mg to 12.5 mg.  She is also agreeable to increasing Effexor 187.5 to 25 mg daily to help manage symptoms of anxiety and depression.  Patient notes that she has just refilled her Effexor and would like to complete her pills before picking up a new prescription.  She notes that she will double Effexor 37.5 mg.  She will continue all other medications as prescribed.  No other concerns noted at this time.  Visit Diagnosis:    ICD-10-CM   1. Hyposomnia, insomnia or sleeplessness associated with anxiety  F51.05 zolpidem (AMBIEN CR) 12.5 MG CR tablet   F41.9   2. Moderate episode of recurrent major depressive disorder (HCC)  F33.1 venlafaxine XR (EFFEXOR-XR) 150 MG 24 hr capsule    venlafaxine XR (EFFEXOR-XR) 75 MG 24 hr capsule    Past Psychiatric History:  insomnia, anxiety, depression, and passive SI.  Past Medical History:  Past Medical History:  Diagnosis Date  . Anxiety   . Depression   . Headache    SINUS  . Hyperlipidemia   . Hypertension   . Osteopenia   . Primary localized osteoarthritis of right knee     Past Surgical History:  Procedure Laterality Date  . BREAST EXCISIONAL BIOPSY Right    benign cyst removal   . CARPAL TUNNEL RELEASE Right 2015   Ortho in [place]  . LASIK    . MENISCUS REPAIR Right 2011   Orthopedic in [place]  . TOTAL  KNEE ARTHROPLASTY Right 09/20/2015  . TOTAL KNEE ARTHROPLASTY Right 09/20/2015   Procedure: TOTAL KNEE ARTHROPLASTY;  Surgeon: Elsie Saas, MD;  Location: [place];  Service: Orthopedics;  Laterality: Right;  . Trigger Thumb Right 2015   Done in [facility] at the same time as the carpal tunnel release  . TUBAL LIGATION  1980    Family Psychiatric History: Mother anxiety, maternal aunts alcohol use, maternal aunt anorexia Family History:  Family History  Problem Relation Age of Onset  . Heart failure Mother   . Heart attack Mother   . Alcohol  abuse Maternal Aunt   . Heart attack Father   . Irregular heart beat Sister   . Hypertension Sister   . Heart disease Brother     Social History:  Social History   Socioeconomic History  . Marital status: Married    Spouse name: Purcell Nails  . Number of children: 1  . Years of education: 17.5  . Highest education level: Master's degree (e.g., MA, MS, MEng, MEd, MSW, MBA)  Occupational History  . Occupation: retired    Comment: Licensed conveyancer  Tobacco Use  . Smoking status: Never Smoker  . Smokeless tobacco: Never Used  Vaping Use  . Vaping Use: Never used  Substance and Sexual Activity  . Alcohol use: No    Comment: 1 glass a month  . Drug use: No  . Sexual activity: Yes  Other Topics Concern  . Not on file  Social History Narrative   Reads paper, walks, housework errands. Caffeine use daily   Social Determinants of Health   Financial Resource Strain:   . Difficulty of Paying Living Expenses: Not on file  Food Insecurity:   . Worried About Charity fundraiser in the Last Year: Not on file  . Ran Out of Food in the Last Year: Not on file  Transportation Needs:   . Lack of Transportation (Medical): Not on file  . Lack of Transportation (Non-Medical): Not on file  Physical Activity:   . Days of Exercise per Week: Not on file  . Minutes of Exercise per Session: Not on file  Stress:   . Feeling of Stress : Not on file  Social Connections:   . Frequency of Communication with Friends and Family: Not on file  . Frequency of Social Gatherings with Friends and Family: Not on file  . Attends Religious Services: Not on file  . Active Member of Clubs or Organizations: Not on file  . Attends Archivist Meetings: Not on file  . Marital Status: Not on file    Allergies:  Allergies  Allergen Reactions  . Penicillins Hives    Tolerates Cephalosporins   . Sulfa Antibiotics Hives         Metabolic Disorder Labs: Lab Results  Component Value Date   HGBA1C 6.3 (A)  06/30/2019   MPG 131 01/17/2019   MPG 128 11/28/2017   No results found for: PROLACTIN Lab Results  Component Value Date   CHOL 157 06/30/2019   TRIG 161 (H) 06/30/2019   HDL 56 06/30/2019   CHOLHDL 2.8 06/30/2019   [place] 75 06/30/2019   [place] 81 01/17/2019   Lab Results  Component Value Date   TSH 4.01 01/08/2017   TSH 1.93 12/07/2015    Therapeutic Level Labs: No results found for: LITHIUM No results found for: VALPROATE No components found for:  CBMZ  Current Medications: Current Outpatient Medications  Medication Sig Dispense Refill  . amLODipine-benazepril (LOTREL) 5-20 MG  capsule TAKE ONE CAPSULE BY MOUTH DAILY 90 capsule 0  . atorvastatin (LIPITOR) 20 MG tablet TAKE ONE TABLET BY MOUTH DAILY **OFFICE VISIT NEEDED FOR ADDITIONAL REFILLS** 90 tablet 3  . atorvastatin (LIPITOR) 20 MG tablet TAKE ONE TABLET BY MOUTH DAILY 90 tablet 0  . azithromycin (ZITHROMAX) 250 MG tablet 2 tabs po x1 on Day 1, then 1 tab po daily on Days 2 - 5 6 tablet 0  . clindamycin (CLEOCIN) 300 MG capsule Take 300 mg by mouth 2 (two) times daily.    . clonazePAM (KLONOPIN) 0.5 MG tablet TAKE 1/2 TO 1 TABLET BY MOUTH TWO TIMES A DAY AS NEEDED FOR ANXIETY (DO NOT TAKE WITH TEMAZEPAM OR AMBIEN) 60 tablet 5  . fluticasone (FLONASE) 50 MCG/ACT nasal spray Place into both nostrils daily.    . QUEtiapine (SEROQUEL) 25 MG tablet Take 1 tablet (25 mg total) by mouth at bedtime. 30 tablet 0  . raloxifene (EVISTA) 60 MG tablet TAKE ONE TABLET BY MOUTH DAILY 90 tablet 3  . triamterene-hydrochlorothiazide (DYAZIDE) 37.5-25 MG capsule TAKE ONE CAPSULE BY MOUTH DAILY 90 capsule 0  . venlafaxine XR (EFFEXOR-XR) 150 MG 24 hr capsule Take 1 capsule (150 mg total) by mouth daily with breakfast. 90 capsule 2  . venlafaxine XR (EFFEXOR-XR) 75 MG 24 hr capsule Take 1 capsule (75 mg total) by mouth daily with breakfast. 30 capsule 2  . zolpidem (AMBIEN CR) 12.5 MG CR tablet Take 1 tablet (12.5 mg total) by mouth at  bedtime as needed for sleep. 30 tablet 1   No current facility-administered medications for this visit.     Musculoskeletal: Strength & Muscle Tone: Unable to assess due to telephone visit Gait & Station: Unable to assess due to telephone visit Patient leans: N/A  Psychiatric Specialty Exam: Review of Systems  There were no vitals taken for this visit.There is no height or weight on file to calculate BMI.  General Appearance: Unable to assess due to telephone visit  Eye Contact:  Unable to assess due to telephone visit  Speech:  Clear and Coherent and Normal Rate  Volume:  Normal  Mood:  Anxious and Depressed  Affect:  Congruent  Thought Process:  Coherent, Goal Directed and Linear  Orientation:  Full (Time, Place, and Person)  Thought Content: WDL and Logical   Suicidal Thoughts:  No  Homicidal Thoughts:  No  Memory:  Immediate;   Good Recent;   Good Remote;   Good  Judgement:  Good  Insight:  Good  Psychomotor Activity:  Normal  Concentration:  Concentration: Good and Attention Span: Good  Recall:  Good  Fund of Knowledge: Good  Language: Good  Akathisia:  No  Handed:  Right  AIMS (if indicated): Not done  Assets:  Communication Skills Desire for Improvement Financial Resources/Insurance Housing Social Support  ADL's:  Intact  Cognition: WNL  Sleep:  Poor   Screenings: GAD-7     Office Visit from 09/17/2017 in [facility] Office Visit from 02/15/2016 in [facility] Office Visit from 01/18/2016 in [place] Visit from 12/14/2015 in [place]  Total GAD-7 Score 2 11 19 13     PHQ2-9     Office Visit from 12/01/2019 in [facility] Video Visit from 09/23/2019 in [facility] Visit from 05/20/2019 in [place]  [place] Support from 02/18/2018 in [place] Office Visit from 09/17/2017 in [facility]  PHQ-2 Total Score 4 1 0 0 1  PHQ-9 Total Score 11 -- -- -- 4       Assessment and Plan: Patient endorses symptoms of anxiety, depression, insomnia, and passive SI.  She notes that she would like provider to take over her Ambien prescription which she is currently receiving from her PCP.  She is agreeable to increasing Ambien 10 mg to 12.5 mg for sleep.  She is also agreeable to increasing Effexor 187.5 mg to 225 mg daily to manage symptoms of depression and anxiety.  She will continue all other medications prescribed.  1. Hyposomnia, insomnia or sleeplessness associated with anxiety  Increased- zolpidem (AMBIEN CR) 12.5 MG CR tablet; Take 1 tablet (12.5 mg total) by mouth at bedtime as needed for sleep.  Dispense: 30 tablet; Refill: 1  2. Moderate episode of recurrent major depressive disorder (HCC)  Increased- venlafaxine XR (EFFEXOR-XR) 150 MG 24 hr capsule; Take 1 capsule (150 mg total) by mouth daily with breakfast.  Dispense: 90 capsule; Refill: 2 Increased- venlafaxine XR (EFFEXOR-XR) 75 MG 24 hr capsule; Take 1 capsule (75 mg total) by mouth daily with breakfast.  Dispense: 30 capsule; Refill: 2  Follow-up in 2 months   Salley Slaughter, NP 12/01/2019, 2:29 PM

## 2019-12-04 ENCOUNTER — Encounter: Payer: Medicare Other | Admitting: Physical Therapy

## 2019-12-07 ENCOUNTER — Other Ambulatory Visit (HOSPITAL_COMMUNITY): Payer: Self-pay | Admitting: Psychiatry

## 2019-12-07 ENCOUNTER — Other Ambulatory Visit: Payer: Self-pay | Admitting: Osteopathic Medicine

## 2019-12-07 DIAGNOSIS — M8580 Other specified disorders of bone density and structure, unspecified site: Secondary | ICD-10-CM

## 2019-12-07 DIAGNOSIS — I10 Essential (primary) hypertension: Secondary | ICD-10-CM

## 2019-12-10 ENCOUNTER — Other Ambulatory Visit (HOSPITAL_COMMUNITY): Payer: Self-pay | Admitting: Psychiatry

## 2019-12-10 ENCOUNTER — Telehealth (HOSPITAL_COMMUNITY): Payer: Self-pay | Admitting: *Deleted

## 2019-12-10 DIAGNOSIS — F5101 Primary insomnia: Secondary | ICD-10-CM

## 2019-12-10 MED ORDER — TEMAZEPAM 15 MG PO CAPS: 15.0000 mg | ORAL_CAPSULE | Freq: Every evening | ORAL | 0 refills | Status: DC | PRN

## 2019-12-10 NOTE — Telephone Encounter (Signed)
Provider spoke with patient who noted that at times she is only able to sleep 2 hours nightly. She is agreeable to discontinue Ambien and start Restoril 15 mg nightly to help manage insomnia. She will continue all other medications as prescribed. No other concerns noted at this time.

## 2019-12-10 NOTE — Telephone Encounter (Signed)
Call from patient stating she is sleeping poorly, one night out of three and would like Dr Ronne Binning to make any recommended adjustments. Will bring her concern to Dr Gershon Crane.

## 2019-12-11 ENCOUNTER — Encounter: Payer: Medicare Other | Admitting: Physical Therapy

## 2019-12-16 ENCOUNTER — Ambulatory Visit (INDEPENDENT_AMBULATORY_CARE_PROVIDER_SITE_OTHER): Payer: Medicare Other | Admitting: Orthopaedic Surgery

## 2019-12-16 ENCOUNTER — Telehealth (HOSPITAL_COMMUNITY): Payer: Self-pay | Admitting: *Deleted

## 2019-12-16 ENCOUNTER — Encounter: Payer: Self-pay | Admitting: Orthopaedic Surgery

## 2019-12-16 ENCOUNTER — Other Ambulatory Visit: Payer: Self-pay

## 2019-12-16 DIAGNOSIS — M65342 Trigger finger, left ring finger: Secondary | ICD-10-CM | POA: Diagnosis not present

## 2019-12-16 DIAGNOSIS — M65322 Trigger finger, left index finger: Secondary | ICD-10-CM | POA: Diagnosis not present

## 2019-12-16 NOTE — Progress Notes (Signed)
Office Visit Note   Patient: [name]           Date of Birth: 02-19-1945           MRN: [id number] Visit Date: 12/16/2019              Requested by: Emeterio Reeve, [place] [address] [place],  [place] [address] PCP: Emeterio Reeve, DO   Assessment & Plan: Visit Diagnoses:  1. Trigger finger, left ring finger   2. Trigger finger, left index finger     Plan: Impression is recurrent left ring and index trigger fingers.  At this point she has had multiple injections with only temporary relief.  Based on discussion she has elected to proceed with surgical release of the left index and ring trigger fingers for sometime after Christmas.  Risk benefits rehab recovery reviewed with the patient in detail.  We will reach out to the patient in the near future to schedule surgery.  Follow-Up Instructions: Return if symptoms worsen or fail to improve.   Orders:  No orders of the defined types were placed in this encounter.  No orders of the defined types were placed in this encounter.     Procedures: No procedures performed   Clinical Data: No additional findings.   Subjective: Chief Complaint  Patient presents with  . Left Ring Finger - Pain    HPI patient is a pleasant 74 year old female who comes in today with recurrent pain in the triggering to the left ring finger.  She has been experiencing the symptoms for several months.  We saw her in February of this year and then again in June where the finger was injected both times.  Her last injection helped for about 3 to 4 months.  Her symptoms have returned and are starting to worsen.  She also has a history of right index trigger finger which is also been injected twice.  This finger is currently asymptomatic.  She is not a diabetic.  Review of Systems as detailed in HPI.  All others reviewed and are negative.   Objective: Vital Signs: There were no vitals taken for this visit.  Physical Exam  well-developed well-nourished female in no acute distress.  Alert oriented x3.  Ortho Exam left ring finger exam shows a small, palpable nodule that is moderately tender.  She does not have reproducible triggering.  She is neurovascular intact distally. Left index finger shows tenderness along the flexor tendon.  No obvious triggering on examination.  Specialty Comments:  No specialty comments available.  Imaging: No new imaging   PMFS History: Patient Active Problem List   Diagnosis Date Noted  . Trigger finger, left index finger 12/16/2019  . Moderate episode of recurrent major depressive disorder ([place]) 09/02/2019  . Generalized anxiety disorder 09/02/2019  . Anxiety disorder 08/16/2019  . Primary insomnia 08/16/2019  . Trigger finger, left ring finger 08/08/2019  . Post-menopausal 05/14/2017  . Prediabetes 01/12/2017  . Snoring 07/20/2016  . Obsessive-compulsive personality trait 12/07/2015  . Morbid obesity ([place]) 09/19/2015  . Primary localized osteoarthritis of right knee   . Cardiac risk counseling 08/16/2015  . Essential hypertension 08/04/2015  . Hyperlipidemia 08/04/2015  . Osteopenia 08/04/2015  . Anxiety associated with depression 08/04/2015  . Right knee pain 06/14/2015  . Hyposomnia, insomnia or sleeplessness associated with anxiety 06/14/2015   Past Medical History:  Diagnosis Date  . Anxiety   . Depression   . Headache    SINUS  .  Hyperlipidemia   . Hypertension   . Osteopenia   . Primary localized osteoarthritis of right knee     Family History  Problem Relation Age of Onset  . Heart failure Mother   . Heart attack Mother   . Alcohol abuse Maternal Aunt   . Heart attack Father   . Irregular heart beat Sister   . Hypertension Sister   . Heart disease Brother     Past Surgical History:  Procedure Laterality Date  . BREAST EXCISIONAL BIOPSY Right    benign cyst removal   . CARPAL TUNNEL RELEASE Right 2015   Ortho in [place]  . LASIK    .  MENISCUS REPAIR Right 2011   Orthopedic in [place]  . TOTAL KNEE ARTHROPLASTY Right 09/20/2015  . TOTAL KNEE ARTHROPLASTY Right 09/20/2015   Procedure: TOTAL KNEE ARTHROPLASTY;  Surgeon: Elsie Saas, MD;  Location: [place];  Service: Orthopedics;  Laterality: Right;  . Trigger Thumb Right 2015   Done in [facility] at the same time as the carpal tunnel release  . TUBAL LIGATION  1980   Social History   Occupational History  . Occupation: retired    Comment: Licensed conveyancer  Tobacco Use  . Smoking status: Never Smoker  . Smokeless tobacco: Never Used  Vaping Use  . Vaping Use: Never used  Substance and Sexual Activity  . Alcohol use: No    Comment: 1 glass a month  . Drug use: No  . Sexual activity: Yes

## 2019-12-16 NOTE — Telephone Encounter (Signed)
Submitted a PA yesterday for patients Temazepam and it was denied based on the quantity., Her plan only allows for 15 pills per month and the RX is for 30/30, Directed by [employer] to appeal it and copied the last three notes by providers and submitted them for their consideration.

## 2019-12-16 NOTE — Telephone Encounter (Signed)
After calling her health care plan after she was denied authorization for 30 pills for 30 days on her Temazepam, sent an appeal. [employer] sent back a release for patient to sign and for me to resubmit. Called patient and she gave me verbal permission to appeal her case to her health care company for her sleeping aide. She states she had to have it so she paid for the Rx out of her pocket but it was fairly expensive and cant continue to do that. Resubmitted.

## 2019-12-24 ENCOUNTER — Telehealth (HOSPITAL_COMMUNITY): Payer: Self-pay

## 2019-12-24 DIAGNOSIS — Z23 Encounter for immunization: Secondary | ICD-10-CM | POA: Diagnosis not present

## 2019-12-24 NOTE — Telephone Encounter (Signed)
RECEIVED FAX FROM PHARMACY. PA WAS DONE ON PATIENT'S TEMAZEPAM 15MG  CAPSULE. PA WAS DENIED. DID AN APPEAL AND SENT SUPPORTING DOCUMENTATION.  DENIED AGAIN AS OF 11/2 REF # [id number] S/W [facility] REASON: QUANTITY IS MORE THAN THE MAXIMUM QUANTITY ALLOWED ON THE PATIENT'S PLAN. #15/MONTH ALLOWED

## 2019-12-29 ENCOUNTER — Telehealth (HOSPITAL_COMMUNITY): Payer: Self-pay | Admitting: *Deleted

## 2019-12-29 NOTE — Telephone Encounter (Signed)
Message left on writers VM fri afternoon to report she has been overtaking her Effexor without intending to, she misread the bottle and has been taking 150 mg QD even before she was given an increase per provider. She wanted to let provider know and for someone to call her back with any needed changes. Brittney is her provider and will be out of the office until tomorrow, will bring this concern to her attention.

## 2019-12-30 ENCOUNTER — Telehealth (HOSPITAL_COMMUNITY): Payer: Self-pay | Admitting: *Deleted

## 2019-12-30 NOTE — Telephone Encounter (Signed)
Called a second time to have her Effexor dose clarified she believes she has gotten it mixed up. Called her pharmacy at [facility] and the pharmacist unsure due to the three different Rxs 150mg . 75mg , and 37.5 mg and that one was written a week after the other two. Reviewing [name] noted it appears she intends for her to take 225 mg total. [name] is not in office today or yesterday. Pharmacy will notify her of her meds when ready.

## 2019-12-31 ENCOUNTER — Encounter: Payer: Medicare Other | Admitting: Osteopathic Medicine

## 2020-01-01 ENCOUNTER — Other Ambulatory Visit (HOSPITAL_COMMUNITY): Payer: Self-pay | Admitting: Psychiatry

## 2020-01-01 ENCOUNTER — Ambulatory Visit (INDEPENDENT_AMBULATORY_CARE_PROVIDER_SITE_OTHER): Payer: Medicare Other | Admitting: Osteopathic Medicine

## 2020-01-01 ENCOUNTER — Encounter: Payer: Self-pay | Admitting: Osteopathic Medicine

## 2020-01-01 ENCOUNTER — Telehealth (HOSPITAL_COMMUNITY): Payer: Self-pay | Admitting: *Deleted

## 2020-01-01 ENCOUNTER — Other Ambulatory Visit: Payer: Self-pay

## 2020-01-01 VITALS — BP 133/75 | HR 98 | Temp 98.5°F | Wt 181.0 lb

## 2020-01-01 DIAGNOSIS — M8580 Other specified disorders of bone density and structure, unspecified site: Secondary | ICD-10-CM | POA: Diagnosis not present

## 2020-01-01 DIAGNOSIS — M545 Low back pain, unspecified: Secondary | ICD-10-CM

## 2020-01-01 DIAGNOSIS — I10 Essential (primary) hypertension: Secondary | ICD-10-CM

## 2020-01-01 DIAGNOSIS — F5101 Primary insomnia: Secondary | ICD-10-CM

## 2020-01-01 DIAGNOSIS — G8929 Other chronic pain: Secondary | ICD-10-CM

## 2020-01-01 DIAGNOSIS — R7303 Prediabetes: Secondary | ICD-10-CM | POA: Diagnosis not present

## 2020-01-01 LAB — POCT GLYCOSYLATED HEMOGLOBIN (HGB A1C): Hemoglobin A1C: 6.5 % — AB (ref 4.0–5.6)

## 2020-01-01 MED ORDER — TEMAZEPAM 22.5 MG PO CAPS: 22.5000 mg | ORAL_CAPSULE | Freq: Every evening | ORAL | 0 refills | Status: DC | PRN

## 2020-01-01 MED ORDER — RALOXIFENE HCL 60 MG PO TABS
60.0000 mg | ORAL_TABLET | Freq: Every day | ORAL | 3 refills | Status: DC
Start: 2020-01-01 — End: 2021-01-17

## 2020-01-01 MED ORDER — ATORVASTATIN CALCIUM 20 MG PO TABS
20.0000 mg | ORAL_TABLET | Freq: Every day | ORAL | 3 refills | Status: DC
Start: 2020-01-01 — End: 2021-01-25

## 2020-01-01 MED ORDER — AMLODIPINE BESY-BENAZEPRIL HCL 5-20 MG PO CAPS
1.0000 | ORAL_CAPSULE | Freq: Every day | ORAL | 3 refills | Status: DC
Start: 2020-01-01 — End: 2021-02-25

## 2020-01-01 MED ORDER — TRIAMTERENE-HCTZ 37.5-25 MG PO CAPS
1.0000 | ORAL_CAPSULE | Freq: Every day | ORAL | 0 refills | Status: DC
Start: 2020-01-01 — End: 2020-04-06

## 2020-01-01 NOTE — Patient Instructions (Addendum)
Placed referral for physical therapy. If not helping w/ back pain would schedule something w/ Dr. Darene Lamer our sports medicine physician or can see Dr. Georgina Snell again - he is in [place] at [place] on [address].   General Preventive Care  Most recent routine screening labs: ordered.   Blood pressure goal 130/80 or less.   Tobacco: don't!   Alcohol: responsible moderation is ok for most adults - if you have concerns about your alcohol intake, please talk to me!   Exercise: as tolerated to reduce risk of cardiovascular disease and diabetes. Strength training will also prevent osteoporosis.   Mental health: if need for mental health care (medicines, counseling, other), or concerns about moods, please let me know!   Sexual / Reproductive health: if need for STD testing, or if concerns with libido/pain problems, please let me know!   Advanced Directive: Living Will and/or Healthcare Power of Attorney recommended for all adults, regardless of age or health.  Vaccines  Flu vaccine:recommended every fall.   Shingles vaccine: done!  Pneumonia vaccines: done!   Tetanus booster: due 11/2021  COVID vaccine: THANKS for getting your vaccine! :)  Cancer screenings   Colon cancer screening: for everyone age 24-75. Optional Cologuard 10/2022  Breast cancer screening: mammogram 05/2020  Cervical cancer screening: Pap done after age 71  Lung cancer screening: not needed Infection screenings  . HIV: recommended screening at least once age 43-65, more often as needed. . Gonorrhea/Chlamydia: screening as needed . Hepatitis C: recommended once for everyone age 49-75 . TB: certain at-risk populations, or depending on work requirements and/or travel history Other . Bone Density Test: due 07/2021

## 2020-01-01 NOTE — Telephone Encounter (Signed)
Patient informed Probation officer that she takes Effexor 150 mg instead of to 225 mg.  She notes that this is effective in managing her depression.  No other concerns noted at this time.

## 2020-01-01 NOTE — Progress Notes (Signed)
HPI: [name] is a 74 y.o. female who  has a past medical history of Anxiety, Depression, Headache, Hyperlipidemia, Hypertension, Osteopenia, and Primary localized osteoarthritis of right knee.  she presents to [facility] today, 01/01/20, for several issues see headings below:  Routine checkup -see below for review of preventive care  Complains of bilateral low back pain: Has been in physical therapy in the past which helped tremendously.  Has been nervous to participate in PT/exercise much since her cataract surgery, there are some restrictions regarding strenuous exercise that she was given to follow postoperatively.  She also has another surgery upcoming to correct trigger finger problems.  No axial back pain symptoms, no numbness/tingling into the legs, no falling.  She does report that it is limiting her ability to walk, she does not use any sort of assistive device and does not feel that she really needs it.  On exam, no midline spinal tenderness, patient reports some tenderness in lateral lumbar musculature around quadratus lumborum area.  Negative straight leg raise.  Insomnia/anxiety: Patient is following with psychiatry, reports the medication regimen has changed a little bit, overall she is feeling problem is fairly well controlled though she still does have some significant problems with insomnia.        ASSESSMENT/PLAN: The primary encounter diagnosis was Chronic bilateral low back pain without sciatica. Diagnoses of Essential hypertension, Prediabetes, and Osteopenia were also pertinent to this visit.  1. Chronic bilateral low back pain without sciatica --> I think probably okay to participate in physical therapy since this was helpful her before.  She has some concerns over whether knee issues might be contributing to abnormal gait, I think a sports medicine consult might be useful here to determine if we need to be addressing lower  leg issues versus lower back issues  2. Essential hypertension --> Labs reviewed from about 6 months ago, no concerns  3. Prediabetes --> A1c today borderline elevated, patient is working actively on weight loss but exercise is a challenge for her given her orthopedic issues.  Recheck A1c in 6 months  4. Osteopenia --> Continue vitamin D/calcium, refilled medications as below   Orders Placed This Encounter  Procedures  . Ambulatory referral to Physical Therapy  . POCT HgB A1C     Meds ordered this encounter  Medications  . amLODipine-benazepril (LOTREL) 5-20 MG capsule    Sig: Take 1 capsule by mouth daily.    Dispense:  90 capsule    Refill:  3  . atorvastatin (LIPITOR) 20 MG tablet    Sig: Take 1 tablet (20 mg total) by mouth daily.    Dispense:  90 tablet    Refill:  3  . raloxifene (EVISTA) 60 MG tablet    Sig: Take 1 tablet (60 mg total) by mouth daily.    Dispense:  90 tablet    Refill:  3  . triamterene-hydrochlorothiazide (DYAZIDE) 37.5-25 MG capsule    Sig: Take 1 each (1 capsule total) by mouth daily.    Dispense:  90 capsule    Refill:  0    Patient Instructions  Placed referral for physical therapy. If not helping w/ back pain would schedule something w/ Dr. Darene Lamer our sports medicine physician or can see Dr. Georgina Snell again - he is in [place] at [place] on [address].   General Preventive Care  Most recent routine screening labs: ordered.   Blood pressure goal 130/80 or less.  Tobacco: don't!   Alcohol: responsible moderation is ok for most adults - if you have concerns about your alcohol intake, please talk to me!   Exercise: as tolerated to reduce risk of cardiovascular disease and diabetes. Strength training will also prevent osteoporosis.   Mental health: if need for mental health care (medicines, counseling, other), or concerns about moods, please let me know!   Sexual / Reproductive health: if need for STD testing, or if  concerns with libido/pain problems, please let me know!   Advanced Directive: Living Will and/or Healthcare Power of Attorney recommended for all adults, regardless of age or health.  Vaccines  Flu vaccine:recommended every fall.   Shingles vaccine: done!  Pneumonia vaccines: done!   Tetanus booster: due 11/2021  COVID vaccine: THANKS for getting your vaccine! :)  Cancer screenings   Colon cancer screening: for everyone age 47-75. Optional Cologuard 10/2022  Breast cancer screening: mammogram 05/2020  Cervical cancer screening: Pap done after age 20  Lung cancer screening: not needed Infection screenings  . HIV: recommended screening at least once age 40-65, more often as needed. . Gonorrhea/Chlamydia: screening as needed . Hepatitis C: recommended once for everyone age 69-75 . TB: certain at-risk populations, or depending on work requirements and/or travel history Other . Bone Density Test: due 07/2021     Follow-up plan: Return in about 6 months (around 06/30/2020) for ROUTINE CHECK-UP AND LABS, SEE ME SOONER IF NEEDED.                                                 ################################################# ################################################# ################################################# #################################################    Current Meds  Medication Sig  . amLODipine-benazepril (LOTREL) 5-20 MG capsule Take 1 capsule by mouth daily.  Marland Kitchen atorvastatin (LIPITOR) 20 MG tablet Take 1 tablet (20 mg total) by mouth daily.  . clonazePAM (KLONOPIN) 0.5 MG tablet TAKE 1/2 TO 1 TABLET BY MOUTH TWO TIMES A DAY AS NEEDED FOR ANXIETY (DO NOT TAKE WITH TEMAZEPAM OR AMBIEN)  . fluticasone (FLONASE) 50 MCG/ACT nasal spray Place into both nostrils daily.  . raloxifene (EVISTA) 60 MG tablet Take 1 tablet (60 mg total) by mouth daily.  Marland Kitchen triamterene-hydrochlorothiazide (DYAZIDE) 37.5-25 MG capsule  Take 1 each (1 capsule total) by mouth daily.  Marland Kitchen venlafaxine XR (EFFEXOR-XR) 150 MG 24 hr capsule Take 1 capsule (150 mg total) by mouth daily with breakfast.  . [DISCONTINUED] amLODipine-benazepril (LOTREL) 5-20 MG capsule TAKE ONE CAPSULE BY MOUTH DAILY  . [DISCONTINUED] atorvastatin (LIPITOR) 20 MG tablet TAKE ONE TABLET BY MOUTH DAILY  . [DISCONTINUED] clindamycin (CLEOCIN) 300 MG capsule Take 300 mg by mouth 2 (two) times daily.  . [DISCONTINUED] raloxifene (EVISTA) 60 MG tablet TAKE ONE TABLET BY MOUTH DAILY  . [DISCONTINUED] temazepam (RESTORIL) 15 MG capsule Take 1 capsule (15 mg total) by mouth at bedtime as needed for sleep.  . [DISCONTINUED] triamterene-hydrochlorothiazide (DYAZIDE) 37.5-25 MG capsule TAKE ONE CAPSULE BY MOUTH DAILY    Allergies  Allergen Reactions  . Penicillins Hives    Tolerates Cephalosporins   . Sulfa Antibiotics Hives            Review of Systems: Pertinent (+) and (-) ROS in HPI as above   Exam:  BP 133/75 (BP Location: Left Arm, Patient Position: Sitting, Cuff Size: Normal)   Pulse 98   Temp 98.5 F (36.9 C) (Oral)  Wt 181 lb 0.6 oz (82.1 kg)   BMI 33.11 kg/m   Constitutional: VS see above. General Appearance: alert, well-developed, well-nourished, NAD  Neck: No masses, trachea midline.   Respiratory: Normal respiratory effort. no wheeze, no rhonchi, no rales  Cardiovascular: S1/S2 normal, no murmur, no rub/gallop auscultated. RRR.   Musculoskeletal: Gait normal. Symmetric and independent movement of all extremities  Abdominal: non-tender, non-distended, no appreciable organomegaly, neg Murphy's, BS WNLx4  Neurological: Normal balance/coordination. No tremor.  Skin: warm, dry, intact.   Psychiatric: Normal judgment/insight. Normal mood and affect. Oriented x3.       Visit summary with medication list and pertinent instructions was printed for patient to review, patient was advised to alert [place] if any updates are needed. All  questions at time of visit were answered - patient instructed to contact office with any additional concerns. ER/RTC precautions were reviewed with the patient and understanding verbalized.       Please note: voice recognition software was used to produce this document, and typos may escape review. Please contact Dr. Sheppard Coil for any needed clarifications.    Follow up plan: Return in about 6 months (around 06/30/2020) for ROUTINE CHECK-UP AND LABS, SEE ME SOONER IF NEEDED.

## 2020-01-01 NOTE — Telephone Encounter (Signed)
Patient reports that the Restoril 15 mg is somewhat effective in managing her sleep however notes that some days she only sleeps for 2 hours.  Provider recommended patient having further evaluation via a sleep study.  She was agreeable to this recommendation.  Provider also increased Restoril 15 mg to 22.5 mg and sent to preferred pharmacy.  No other concerns noted at this time.

## 2020-01-01 NOTE — Telephone Encounter (Signed)
Called this am, she is clear on her antidepressant schedule and amount but reports this am she would like to speak to NP re her continued poor sleep. States she sleeps only half of the time. Will make provider aware and see if a change can be made via phone.

## 2020-01-04 ENCOUNTER — Other Ambulatory Visit: Payer: Self-pay | Admitting: Osteopathic Medicine

## 2020-01-04 DIAGNOSIS — E8881 Metabolic syndrome: Secondary | ICD-10-CM

## 2020-01-04 DIAGNOSIS — I10 Essential (primary) hypertension: Secondary | ICD-10-CM

## 2020-01-04 DIAGNOSIS — R7303 Prediabetes: Secondary | ICD-10-CM

## 2020-01-05 ENCOUNTER — Telehealth: Payer: Self-pay

## 2020-01-05 DIAGNOSIS — E8881 Metabolic syndrome: Secondary | ICD-10-CM

## 2020-01-05 DIAGNOSIS — R7303 Prediabetes: Secondary | ICD-10-CM

## 2020-01-05 DIAGNOSIS — E669 Obesity, unspecified: Secondary | ICD-10-CM

## 2020-01-05 DIAGNOSIS — I10 Essential (primary) hypertension: Secondary | ICD-10-CM

## 2020-01-05 MED ORDER — OZEMPIC (0.25 OR 0.5 MG/DOSE) 2 MG/1.5ML ~~LOC~~ SOPN: 0.5000 mg | PEN_INJECTOR | SUBCUTANEOUS | 5 refills | Status: DC

## 2020-01-05 NOTE — Telephone Encounter (Signed)
[facility] requesting med refill for [employer]. Rx not listed in active med list.

## 2020-01-05 NOTE — Telephone Encounter (Signed)
[name] from [facility] called requesting SIG clarification regarding Ozempic rx. Per pharmacist, provider sent in a rx to inject 5 mg/ once a week. Did provider mean to indicate 0.5 mg/ once weekly? Pls send in an updated rx to the pharmacy.

## 2020-01-13 ENCOUNTER — Other Ambulatory Visit: Payer: Self-pay | Admitting: Physician Assistant

## 2020-01-13 MED ORDER — HYDROCODONE-ACETAMINOPHEN 5-325 MG PO TABS: 1.0000 | ORAL_TABLET | Freq: Four times a day (QID) | ORAL | 0 refills | Status: DC | PRN

## 2020-01-13 MED ORDER — ONDANSETRON HCL 4 MG PO TABS: 4.0000 mg | ORAL_TABLET | Freq: Three times a day (TID) | ORAL | 0 refills | Status: DC | PRN

## 2020-01-14 ENCOUNTER — Telehealth (HOSPITAL_COMMUNITY): Payer: Self-pay | Admitting: *Deleted

## 2020-01-14 ENCOUNTER — Other Ambulatory Visit (HOSPITAL_COMMUNITY): Payer: Self-pay | Admitting: Psychiatry

## 2020-01-14 DIAGNOSIS — F5101 Primary insomnia: Secondary | ICD-10-CM

## 2020-01-14 MED ORDER — TEMAZEPAM 22.5 MG PO CAPS: 22.5000 mg | ORAL_CAPSULE | Freq: Every evening | ORAL | 0 refills | Status: DC | PRN

## 2020-01-14 NOTE — Telephone Encounter (Signed)
Prior authorization done for her Temazepam, response from the authorization is she can only fill 15 pills at a time, then she will have to come back in to the pharmacy every 15 days for her Rx and pay a co pay, otherwise she can purchase it out of her pocket and not use her insurance.

## 2020-01-14 NOTE — Telephone Encounter (Signed)
Temazapam reordered for 22.5 mg nightly # 15. Pharmacy notified to fill prescription every 15 days due to prior authorization.

## 2020-01-14 NOTE — Telephone Encounter (Signed)
Prior authorization submitted for her Temazepam resulted her ins co would only approve for 15 quantity at a time. Notified patient of this and she expressed her understanding and acceptance of it.

## 2020-01-15 DIAGNOSIS — M65322 Trigger finger, left index finger: Secondary | ICD-10-CM | POA: Diagnosis not present

## 2020-01-15 DIAGNOSIS — M65342 Trigger finger, left ring finger: Secondary | ICD-10-CM | POA: Diagnosis not present

## 2020-01-15 DIAGNOSIS — M65332 Trigger finger, left middle finger: Secondary | ICD-10-CM | POA: Diagnosis not present

## 2020-01-22 ENCOUNTER — Ambulatory Visit (INDEPENDENT_AMBULATORY_CARE_PROVIDER_SITE_OTHER): Payer: Medicare Other | Admitting: Orthopaedic Surgery

## 2020-01-22 ENCOUNTER — Encounter: Payer: Self-pay | Admitting: Orthopaedic Surgery

## 2020-01-22 ENCOUNTER — Other Ambulatory Visit: Payer: Self-pay

## 2020-01-22 DIAGNOSIS — M65322 Trigger finger, left index finger: Secondary | ICD-10-CM

## 2020-01-22 DIAGNOSIS — M65342 Trigger finger, left ring finger: Secondary | ICD-10-CM

## 2020-01-22 NOTE — Progress Notes (Signed)
Post-Op Visit Note   Patient: [name]           Date of Birth: 06/01/1945           MRN: [id number] Visit Date: 01/22/2020 PCP: Emeterio Reeve, DO   Assessment & Plan:  Chief Complaint:  Chief Complaint  Patient presents with  . Left Ring Finger - Routine Post Op   Visit Diagnoses:  1. Trigger finger, left ring finger   2. Trigger finger, left index finger     Plan:   Faten is 1 week status post left ring and index trigger finger releases.  Doing well overall.  No complaints.  Surgical incisions are intact.  No signs of infection.  Band-Aids applied today.  We will have her come back next week for suture removal.  Wound care instructions reviewed with her today.  Follow-Up Instructions: Return in about 1 week (around 01/29/2020).   Orders:  No orders of the defined types were placed in this encounter.  No orders of the defined types were placed in this encounter.   Imaging: No results found.  PMFS History: Patient Active Problem List   Diagnosis Date Noted  . Trigger finger, left index finger 12/16/2019  . Moderate episode of recurrent major depressive disorder ([place]) 09/02/2019  . Generalized anxiety disorder 09/02/2019  . Anxiety disorder 08/16/2019  . Primary insomnia 08/16/2019  . Trigger finger, left ring finger 08/08/2019  . Post-menopausal 05/14/2017  . Prediabetes 01/12/2017  . Snoring 07/20/2016  . Obsessive-compulsive personality trait 12/07/2015  . Morbid obesity ([place]) 09/19/2015  . Primary localized osteoarthritis of right knee   . Cardiac risk counseling 08/16/2015  . Essential hypertension 08/04/2015  . Hyperlipidemia 08/04/2015  . Osteopenia 08/04/2015  . Anxiety associated with depression 08/04/2015  . Right knee pain 06/14/2015  . Hyposomnia, insomnia or sleeplessness associated with anxiety 06/14/2015   Past Medical History:  Diagnosis Date  . Anxiety   . Depression   . Headache    SINUS  . Hyperlipidemia   .  Hypertension   . Osteopenia   . Primary localized osteoarthritis of right knee     Family History  Problem Relation Age of Onset  . Heart failure Mother   . Heart attack Mother   . Alcohol abuse Maternal Aunt   . Heart attack Father   . Irregular heart beat Sister   . Hypertension Sister   . Heart disease Brother     Past Surgical History:  Procedure Laterality Date  . BREAST EXCISIONAL BIOPSY Right    benign cyst removal   . CARPAL TUNNEL RELEASE Right 2015   Ortho in [place]  . LASIK    . MENISCUS REPAIR Right 2011   Orthopedic in [place]  . TOTAL KNEE ARTHROPLASTY Right 09/20/2015  . TOTAL KNEE ARTHROPLASTY Right 09/20/2015   Procedure: TOTAL KNEE ARTHROPLASTY;  Surgeon: Elsie Saas, MD;  Location: [facility];  Service: Orthopedics;  Laterality: Right;  . Trigger Thumb Right 2015   Done in [facility] at the same time as the carpal tunnel release  . TUBAL LIGATION  1980   Social History   Occupational History  . Occupation: retired    Comment: Licensed conveyancer  Tobacco Use  . Smoking status: Never Smoker  . Smokeless tobacco: Never Used  Vaping Use  . Vaping Use: Never used  Substance and Sexual Activity  . Alcohol use: No    Comment: 1 glass a month  . Drug use: No  . Sexual activity: Yes

## 2020-01-28 ENCOUNTER — Institutional Professional Consult (permissible substitution): Payer: Medicare Other | Admitting: Neurology

## 2020-01-29 ENCOUNTER — Other Ambulatory Visit: Payer: Self-pay

## 2020-01-29 ENCOUNTER — Ambulatory Visit (INDEPENDENT_AMBULATORY_CARE_PROVIDER_SITE_OTHER): Payer: Medicare Other | Admitting: Orthopaedic Surgery

## 2020-01-29 DIAGNOSIS — M65342 Trigger finger, left ring finger: Secondary | ICD-10-CM

## 2020-01-29 DIAGNOSIS — M65322 Trigger finger, left index finger: Secondary | ICD-10-CM

## 2020-01-29 NOTE — Progress Notes (Signed)
Post-Op Visit Note   Patient: [name]           Date of Birth: 1945-08-08           MRN: [id number] Visit Date: 01/29/2020 PCP: Emeterio Reeve, DO   Assessment & Plan:  Chief Complaint:  Chief Complaint  Patient presents with  . Left Index Finger - Routine Post Op  . Left Ring Finger - Routine Post Op   Visit Diagnoses:  1. Trigger finger, left ring finger   2. Trigger finger, left index finger     Plan:   [name] is 2 weeks status post trigger finger releases of the left ring and index.  She is doing well has no complaints.  She just has a little tenderness when she tries to grasp tightly.  Surgical incisions are healed.  No signs of infection.  Full arc of motion of the index and ring fingers.  There is no catching or triggering.  At this point sutures were removed.  Increase activity as tolerated.  Follow-up in 4 weeks for final checkup.  Follow-Up Instructions: Return in about 4 weeks (around 02/26/2020).   Orders:  No orders of the defined types were placed in this encounter.  No orders of the defined types were placed in this encounter.   Imaging: No results found.  PMFS History: Patient Active Problem List   Diagnosis Date Noted  . Trigger finger, left index finger 12/16/2019  . Moderate episode of recurrent major depressive disorder ([place]) 09/02/2019  . Generalized anxiety disorder 09/02/2019  . Anxiety disorder 08/16/2019  . Primary insomnia 08/16/2019  . Trigger finger, left ring finger 08/08/2019  . Post-menopausal 05/14/2017  . Prediabetes 01/12/2017  . Snoring 07/20/2016  . Obsessive-compulsive personality trait 12/07/2015  . Morbid obesity ([place]) 09/19/2015  . Primary localized osteoarthritis of right knee   . Cardiac risk counseling 08/16/2015  . Essential hypertension 08/04/2015  . Hyperlipidemia 08/04/2015  . Osteopenia 08/04/2015  . Anxiety associated with depression 08/04/2015  . Right knee pain 06/14/2015  . Hyposomnia,  insomnia or sleeplessness associated with anxiety 06/14/2015   Past Medical History:  Diagnosis Date  . Anxiety   . Depression   . Headache    SINUS  . Hyperlipidemia   . Hypertension   . Osteopenia   . Primary localized osteoarthritis of right knee     Family History  Problem Relation Age of Onset  . Heart failure Mother   . Heart attack Mother   . Alcohol abuse Maternal Aunt   . Heart attack Father   . Irregular heart beat Sister   . Hypertension Sister   . Heart disease Brother     Past Surgical History:  Procedure Laterality Date  . BREAST EXCISIONAL BIOPSY Right    benign cyst removal   . CARPAL TUNNEL RELEASE Right 2015   Ortho in [place]  . LASIK    . MENISCUS REPAIR Right 2011   Orthopedic in [place]  . TOTAL KNEE ARTHROPLASTY Right 09/20/2015  . TOTAL KNEE ARTHROPLASTY Right 09/20/2015   Procedure: TOTAL KNEE ARTHROPLASTY;  Surgeon: Elsie Saas, MD;  Location: [facility];  Service: Orthopedics;  Laterality: Right;  . Trigger Thumb Right 2015   Done in [facility] at the same time as the carpal tunnel release  . TUBAL LIGATION  1980   Social History   Occupational History  . Occupation: retired    Comment: Licensed conveyancer  Tobacco Use  . Smoking status: Never Smoker  . Smokeless tobacco:  Never Used  Vaping Use  . Vaping Use: Never used  Substance and Sexual Activity  . Alcohol use: No    Comment: 1 glass a month  . Drug use: No  . Sexual activity: Yes

## 2020-01-30 ENCOUNTER — Encounter (HOSPITAL_COMMUNITY): Payer: Self-pay | Admitting: Psychiatry

## 2020-01-30 ENCOUNTER — Telehealth (INDEPENDENT_AMBULATORY_CARE_PROVIDER_SITE_OTHER): Payer: Medicare Other | Admitting: Psychiatry

## 2020-01-30 DIAGNOSIS — F331 Major depressive disorder, recurrent, moderate: Secondary | ICD-10-CM

## 2020-01-30 DIAGNOSIS — F418 Other specified anxiety disorders: Secondary | ICD-10-CM | POA: Diagnosis not present

## 2020-01-30 DIAGNOSIS — F5101 Primary insomnia: Secondary | ICD-10-CM | POA: Diagnosis not present

## 2020-01-30 MED ORDER — CLONAZEPAM 0.5 MG PO TABS: ORAL_TABLET | ORAL | 2 refills | Status: DC

## 2020-01-30 MED ORDER — TEMAZEPAM 22.5 MG PO CAPS: 22.5000 mg | ORAL_CAPSULE | Freq: Every evening | ORAL | 0 refills | Status: DC | PRN

## 2020-01-30 MED ORDER — VENLAFAXINE HCL ER 150 MG PO CP24: 150.0000 mg | ORAL_CAPSULE | Freq: Every day | ORAL | 2 refills | Status: DC

## 2020-01-30 NOTE — Progress Notes (Signed)
[place] MD/PA/NP OP Progress Note Virtual Visit via Telephone Note  I connected with [name] on 52/84/13 at  9:00 AM EST by telephone and verified that I am speaking with the correct person using two identifiers.  Location: Patient: home Provider: Clinic   I discussed the limitations, risks, security and privacy concerns of performing an evaluation and management service by telephone and the availability of in person appointments. I also discussed with the patient that there may be a patient responsible charge related to this service. The patient expressed understanding and agreed to proceed.   I provided 30 minutes of non-face-to-face time during this encounter.   01/30/2020 2:44 AM [name]  MRN:  [id number]  Chief Complaint:  "I think every okay so far. When I sleep i'm a different person"  HPI: 74 year old female seen today for follow up psychiatric evaluation.   She has a psychiatric history of insomnia, anxiety, depression, and passive SI.  She is currently being managed on, Effexor 150 mg daily, Klonopin 0.5 twice daily (perscribed by PCP), and Restoril 22.5 mg nightly. She notes her medications are effective in managing her psychiatric conditions.  Today she is pleasant, cooperative, engaged in conversation, and maintained eye contact.  She informed Probation officer that her sleep was improved and notes that she feels like a new person. She informed provider that she has very little anxiety and depression. Provider conducted a GAD 7 and patient scored a 5. Provider also conducted a PHQ 9 and patient scored a 4, at last visit she scored an 11. Today denies SI/HI/VAH or paranoia.   Patient informed writer that since her last visit she has had cataract and hand surgery. She notes both procedures went well and informed provider that she has healed appropriately. Due to her surguries she reported that her sleep study was pushed pack to January.   No medication changes made  today. Patient agreeable to continue  other medications as prescribed.  No other concerns noted at this time.  Visit Diagnosis:    ICD-10-CM   1. Anxiety associated with depression  F41.8 venlafaxine XR (EFFEXOR-XR) 150 MG 24 hr capsule    DISCONTINUED: clonazePAM (KLONOPIN) 0.5 MG tablet  2. Moderate episode of recurrent major depressive disorder (HCC)  F33.1 venlafaxine XR (EFFEXOR-XR) 150 MG 24 hr capsule  3. Primary insomnia  F51.01 temazepam (RESTORIL) 22.5 MG capsule    Past Psychiatric History:  insomnia, anxiety, depression, and passive SI.  Past Medical History:  Past Medical History:  Diagnosis Date  . Anxiety   . Depression   . Headache    SINUS  . Hyperlipidemia   . Hypertension   . Osteopenia   . Primary localized osteoarthritis of right knee     Past Surgical History:  Procedure Laterality Date  . BREAST EXCISIONAL BIOPSY Right    benign cyst removal   . CARPAL TUNNEL RELEASE Right 2015   Ortho in [place]  . LASIK    . MENISCUS REPAIR Right 2011   Orthopedic in [place]  . TOTAL KNEE ARTHROPLASTY Right 09/20/2015  . TOTAL KNEE ARTHROPLASTY Right 09/20/2015   Procedure: TOTAL KNEE ARTHROPLASTY;  Surgeon: Elsie Saas, MD;  Location: [place];  Service: Orthopedics;  Laterality: Right;  . Trigger Thumb Right 2015   Done in [facility] at the same time as the carpal tunnel release  . TUBAL LIGATION  1980    Family Psychiatric History: Mother anxiety, maternal aunts alcohol use, maternal aunt anorexia Family History:  Family History  Problem Relation Age of Onset  . Heart failure Mother   . Heart attack Mother   . Alcohol abuse Maternal Aunt   . Heart attack Father   . Irregular heart beat Sister   . Hypertension Sister   . Heart disease Brother     Social History:  Social History   Socioeconomic History  . Marital status: Married    Spouse name: Purcell Nails  . Number of children: 1  . Years of education: 17.5  . Highest education level: Master's degree  (e.g., MA, MS, MEng, MEd, MSW, MBA)  Occupational History  . Occupation: retired    Comment: Licensed conveyancer  Tobacco Use  . Smoking status: Never Smoker  . Smokeless tobacco: Never Used  Vaping Use  . Vaping Use: Never used  Substance and Sexual Activity  . Alcohol use: No    Comment: 1 glass a month  . Drug use: No  . Sexual activity: Yes  Other Topics Concern  . Not on file  Social History Narrative   Reads paper, walks, housework errands. Caffeine use daily   Social Determinants of Health   Financial Resource Strain: Not on file  Food Insecurity: Not on file  Transportation Needs: Not on file  Physical Activity: Not on file  Stress: Not on file  Social Connections: Not on file    Allergies:  Allergies  Allergen Reactions  . Penicillins Hives    Tolerates Cephalosporins   . Sulfa Antibiotics Hives         Metabolic Disorder Labs: Lab Results  Component Value Date   HGBA1C 6.5 (A) 01/01/2020   MPG 131 01/17/2019   MPG 128 11/28/2017   No results found for: PROLACTIN Lab Results  Component Value Date   CHOL 157 06/30/2019   TRIG 161 (H) 06/30/2019   HDL 56 06/30/2019   CHOLHDL 2.8 06/30/2019   [place] 75 06/30/2019   [place] 81 01/17/2019   Lab Results  Component Value Date   TSH 4.01 01/08/2017   TSH 1.93 12/07/2015    Therapeutic Level Labs: No results found for: LITHIUM No results found for: VALPROATE No components found for:  CBMZ  Current Medications: Current Outpatient Medications  Medication Sig Dispense Refill  . amLODipine-benazepril (LOTREL) 5-20 MG capsule Take 1 capsule by mouth daily. 90 capsule 3  . atorvastatin (LIPITOR) 20 MG tablet Take 1 tablet (20 mg total) by mouth daily. 90 tablet 3  . fluticasone (FLONASE) 50 MCG/ACT nasal spray Place into both nostrils daily.    Marland Kitchen HYDROcodone-acetaminophen (NORCO) 5-325 MG tablet Take 1 tablet by mouth every 6 (six) hours as needed. To be taken post-op 30 tablet 0  . ondansetron (ZOFRAN) 4  MG tablet Take 1 tablet (4 mg total) by mouth every 8 (eight) hours as needed for nausea or vomiting. 40 tablet 0  . raloxifene (EVISTA) 60 MG tablet Take 1 tablet (60 mg total) by mouth daily. 90 tablet 3  . Semaglutide,0.25 or 0.5MG /DOS, (OZEMPIC, 0.25 OR 0.5 MG/DOSE,) 2 MG/1.5ML SOPN Inject 0.5 mg into the skin once a week. 7.5 mL 5  . temazepam (RESTORIL) 22.5 MG capsule Take 1 capsule (22.5 mg total) by mouth at bedtime as needed for sleep. 15 capsule 0  . triamterene-hydrochlorothiazide (DYAZIDE) 37.5-25 MG capsule Take 1 each (1 capsule total) by mouth daily. 90 capsule 0  . venlafaxine XR (EFFEXOR-XR) 150 MG 24 hr capsule Take 1 capsule (150 mg total) by mouth daily with breakfast. 90 capsule 2   No current facility-administered  medications for this visit.     Musculoskeletal: Strength & Muscle Tone: Unable to assess due to telehealth visit [place]: Unable to assess due to telehealth visit Patient leans: N/A  Psychiatric Specialty Exam: Review of Systems  There were no vitals taken for this visit.There is no height or weight on file to calculate BMI.  General Appearance: Well Groomed  Eye Contact:  Good  Speech:  Clear and Coherent and Normal Rate  Volume:  Normal  Mood:  Euthymic  Affect:  Congruent  Thought Process:  Coherent, Goal Directed and Linear  Orientation:  Full (Time, Place, and Person)  Thought Content: WDL and Logical   Suicidal Thoughts:  No  Homicidal Thoughts:  No  Memory:  Immediate;   Good Recent;   Good Remote;   Good  Judgement:  Good  Insight:  Good  Psychomotor Activity:  Normal  Concentration:  Concentration: Good and Attention Span: Good  Recall:  Good  Fund of Knowledge: Good  Language: Good  Akathisia:  No  Handed:  Right  AIMS (if indicated): Not done  Assets:  Communication Skills Desire for Improvement Financial Resources/Insurance Housing Social Support  ADL's:  Intact  Cognition: WNL  Sleep:  Good   Screenings: GAD-7    Flowsheet Row Video Visit from 01/30/2020 in [facility] Office Visit from 09/17/2017 in [place] Office Visit from 02/15/2016 in [facility] Office Visit from 01/18/2016 in [place] Visit from 12/14/2015 in [place]  Total GAD-7 Score 5 2 11 19 13     PHQ2-9   Flowsheet Row Video Visit from 01/30/2020 in [facility] Office Visit from 12/01/2019 in [facility] Video Visit from 09/23/2019 in [place] Office Visit from 05/20/2019 in [place] from 02/18/2018 in [place]  PHQ-2 Total Score 1 4 1  0 0  PHQ-9 Total Score 4 11 -- -- --       Assessment and Plan: Patient reports that she is doing well on her current medication regimen. No medication changes made today. Patient agreeable to continue all medications as prescribed.  1. Anxiety associated with depression Continue- venlafaxine XR (EFFEXOR-XR) 150 MG 24 hr capsule; Take 1 capsule (150 mg total) by mouth daily with breakfast.  Dispense: 90 capsule; Refill: 2  2. Moderate episode of recurrent major depressive disorder (HCC)  Continue- venlafaxine XR (EFFEXOR-XR) 150 MG 24 hr capsule; Take 1 capsule (150 mg total) by mouth daily with breakfast.  Dispense: 90 capsule; Refill: 2  3. Primary insomnia  Continue- temazepam (RESTORIL) 22.5 MG capsule; Take 1 capsule (22.5 mg total) by mouth at bedtime as needed for sleep.  Dispense: 15 capsule; Refill: 0    Follow-up in 3 months   Salley Slaughter, NP 01/30/2020, 9:10 AM

## 2020-02-16 ENCOUNTER — Ambulatory Visit: Payer: Medicare Other | Admitting: Physical Therapy

## 2020-02-24 ENCOUNTER — Institutional Professional Consult (permissible substitution): Payer: Medicare Other | Admitting: Neurology

## 2020-02-26 ENCOUNTER — Other Ambulatory Visit: Payer: Self-pay

## 2020-02-26 ENCOUNTER — Ambulatory Visit (INDEPENDENT_AMBULATORY_CARE_PROVIDER_SITE_OTHER): Payer: Medicare Other | Admitting: Orthopaedic Surgery

## 2020-02-26 ENCOUNTER — Encounter: Payer: Self-pay | Admitting: Orthopaedic Surgery

## 2020-02-26 DIAGNOSIS — M65342 Trigger finger, left ring finger: Secondary | ICD-10-CM

## 2020-02-26 DIAGNOSIS — M65322 Trigger finger, left index finger: Secondary | ICD-10-CM

## 2020-02-26 NOTE — Progress Notes (Signed)
Post-Op Visit Note   Patient: [name]           Date of Birth: 1945-10-29           MRN: [id number] Visit Date: 02/26/2020 PCP: Emma Reeve, DO   Assessment & Plan:  Chief Complaint:  Chief Complaint  Patient presents with  . Left Index Finger - Follow-up  . Left Ring Finger - Follow-up   Visit Diagnoses:  1. Trigger finger, left ring finger   2. Trigger finger, left index finger     Plan:   [name] is 6 weeks status post trigger finger releases of the left index and ring fingers.  Overall doing well and has no real complaints other than some persistent swelling.  Surgical scars are just a little bit tender to touch.  Examination of the left hand shows fully healed surgical scars.  Minimally tender to touch.  She is able to make a full composite fist.  No neurovascular compromise.  From my standpoint she has done well and recovering well.  She still has a little bit of persistent swelling which is keeping her from being able to wear her wedding ring but I think this will ultimately resolve so that she can put them on.  We will see her back as needed.  Follow-Up Instructions: Return if symptoms worsen or fail to improve.   Orders:  No orders of the defined types were placed in this encounter.  No orders of the defined types were placed in this encounter.   Imaging: No results found.  PMFS History: Patient Active Problem List   Diagnosis Date Noted  . Trigger finger, left index finger 12/16/2019  . Moderate episode of recurrent major depressive disorder ([place]) 09/02/2019  . Generalized anxiety disorder 09/02/2019  . Anxiety disorder 08/16/2019  . Primary insomnia 08/16/2019  . Trigger finger, left ring finger 08/08/2019  . Post-menopausal 05/14/2017  . Prediabetes 01/12/2017  . Snoring 07/20/2016  . Obsessive-compulsive personality trait 12/07/2015  . Morbid obesity ([place]) 09/19/2015  . Primary localized osteoarthritis of right knee   . Cardiac  risk counseling 08/16/2015  . Essential hypertension 08/04/2015  . Hyperlipidemia 08/04/2015  . Osteopenia 08/04/2015  . Anxiety associated with depression 08/04/2015  . Right knee pain 06/14/2015  . Hyposomnia, insomnia or sleeplessness associated with anxiety 06/14/2015   Past Medical History:  Diagnosis Date  . Anxiety   . Depression   . Headache    SINUS  . Hyperlipidemia   . Hypertension   . Osteopenia   . Primary localized osteoarthritis of right knee     Family History  Problem Relation Age of Onset  . Heart failure Mother   . Heart attack Mother   . Alcohol abuse Maternal Aunt   . Heart attack Father   . Irregular heart beat Sister   . Hypertension Sister   . Heart disease Brother     Past Surgical History:  Procedure Laterality Date  . BREAST EXCISIONAL BIOPSY Right    benign cyst removal   . CARPAL TUNNEL RELEASE Right 2015   Ortho in [place]  . LASIK    . MENISCUS REPAIR Right 2011   Orthopedic in [place]  . TOTAL KNEE ARTHROPLASTY Right 09/20/2015  . TOTAL KNEE ARTHROPLASTY Right 09/20/2015   Procedure: TOTAL KNEE ARTHROPLASTY;  Surgeon: Elsie Saas, MD;  Location: [place];  Service: Orthopedics;  Laterality: Right;  . Trigger Thumb Right 2015   Done in [place] at the same time as the  carpal tunnel release  . TUBAL LIGATION  1980   Social History   Occupational History  . Occupation: retired    Comment: Licensed conveyancer  Tobacco Use  . Smoking status: Never Smoker  . Smokeless tobacco: Never Used  Vaping Use  . Vaping Use: Never used  Substance and Sexual Activity  . Alcohol use: No    Comment: 1 glass a month  . Drug use: No  . Sexual activity: Yes

## 2020-02-27 ENCOUNTER — Ambulatory Visit (INDEPENDENT_AMBULATORY_CARE_PROVIDER_SITE_OTHER): Payer: Medicare Other | Admitting: Family Medicine

## 2020-02-27 DIAGNOSIS — Z0000 Encounter for general adult medical examination without abnormal findings: Secondary | ICD-10-CM

## 2020-02-27 NOTE — Patient Instructions (Addendum)
Health Maintenance, Female Adopting a healthy lifestyle and getting preventive care are important in promoting health and wellness. Ask your health care provider about:  The right schedule for you to have regular tests and exams.  Things you can do on your own to prevent diseases and keep yourself healthy. What should I know about diet, weight, and exercise? Eat a healthy diet  Eat a diet that includes plenty of vegetables, fruits, low-fat dairy products, and lean protein.  Do not eat a lot of foods that are high in solid fats, added sugars, or sodium.   Maintain a healthy weight Body mass index (BMI) is used to identify weight problems. It estimates body fat based on height and weight. Your health care provider can help determine your BMI and help you achieve or maintain a healthy weight. Get regular exercise Get regular exercise. This is one of the most important things you can do for your health. Most adults should:  Exercise for at least 150 minutes each week. The exercise should increase your heart rate and make you sweat (moderate-intensity exercise).  Do strengthening exercises at least twice a week. This is in addition to the moderate-intensity exercise.  Spend less time sitting. Even light physical activity can be beneficial. Watch cholesterol and blood lipids Have your blood tested for lipids and cholesterol at 76 years of age, then have this test every 5 years. Have your cholesterol levels checked more often if:  Your lipid or cholesterol levels are high.  You are older than 76 years of age.  You are at high risk for heart disease. What should I know about cancer screening? Depending on your health history and family history, you may need to have cancer screening at various ages. This may include screening for:  Breast cancer.  Cervical cancer.  Colorectal cancer.  Skin cancer.  Lung cancer. What should I know about heart disease, diabetes, and high blood  pressure? Blood pressure and heart disease  High blood pressure causes heart disease and increases the risk of stroke. This is more likely to develop in people who have high blood pressure readings, are of African descent, or are overweight.  Have your blood pressure checked: ? Every 3-5 years if you are 38-35 years of age. ? Every year if you are 19 years old or older. Diabetes Have regular diabetes screenings. This checks your fasting blood sugar level. Have the screening done:  Once every three years after age 64 if you are at a normal weight and have a low risk for diabetes.  More often and at a younger age if you are overweight or have a high risk for diabetes. What should I know about preventing infection? Hepatitis B If you have a higher risk for hepatitis B, you should be screened for this virus. Talk with your health care provider to find out if you are at risk for hepatitis B infection. Hepatitis C Testing is recommended for:  Everyone born from [birth year] through 1966.  Anyone with known risk factors for hepatitis C. Sexually transmitted infections (STIs)  Get screened for STIs, including gonorrhea and chlamydia, if: ? You are sexually active and are younger than 76 years of age. ? You are older than 76 years of age and your health care provider tells you that you are at risk for this type of infection. ? Your sexual activity has changed since you were last screened, and you are at increased risk for chlamydia or gonorrhea. Ask your health care  provider if you are at risk.  Ask your health care provider about whether you are at high risk for HIV. Your health care provider may recommend a prescription medicine to help prevent HIV infection. If you choose to take medicine to prevent HIV, you should first get tested for HIV. You should then be tested every 3 months for as long as you are taking the medicine. Pregnancy  If you are about to stop having your period (premenopausal) and  you may become pregnant, seek counseling before you get pregnant.  Take 400 to 800 micrograms (mcg) of folic acid every day if you become pregnant.  Ask for birth control (contraception) if you want to prevent pregnancy. Osteoporosis and menopause Osteoporosis is a disease in which the bones lose minerals and strength with aging. This can result in bone fractures. If you are 14 years old or older, or if you are at risk for osteoporosis and fractures, ask your health care provider if you should:  Be screened for bone loss.  Take a calcium or vitamin D supplement to lower your risk of fractures.  Be given hormone replacement therapy (HRT) to treat symptoms of menopause. Follow these instructions at home: Lifestyle  Do not use any products that contain nicotine or tobacco, such as cigarettes, e-cigarettes, and chewing tobacco. If you need help quitting, ask your health care provider.  Do not use street drugs.  Do not share needles.  Ask your health care provider for help if you need support or information about quitting drugs. Alcohol use  Do not drink alcohol if: ? Your health care provider tells you not to drink. ? You are pregnant, may be pregnant, or are planning to become pregnant.  If you drink alcohol: ? Limit how much you use to 0-1 drink a day. ? Limit intake if you are breastfeeding.  Be aware of how much alcohol is in your drink. In the [place] one drink equals one 12 oz bottle of beer (355 mL), one 5 oz glass of wine (148 mL), or one 1 oz glass of hard liquor (44 mL). General instructions  Schedule regular health, dental, and eye exams.  Stay current with your vaccines.  Tell your health care provider if: ? You often feel depressed. ? You have ever been abused or do not feel safe at home. Summary  Adopting a healthy lifestyle and getting preventive care are important in promoting health and wellness.  Follow your health care provider's instructions about healthy  diet, exercising, and getting tested or screened for diseases.  Follow your health care provider's instructions on monitoring your cholesterol and blood pressure. This information is not intended to replace advice given to you by your health care provider. Make sure you discuss any questions you have with your health care provider. Document Revised: 01/23/2018 Document Reviewed: 01/23/2018 Elsevier Patient Education  2021 [place] Maintenance Summary and Written Plan of Care  Ms. Bruney ,  Thank you for allowing me to perform your Medicare Annual Wellness Visit and for your ongoing commitment to your health.   Health Maintenance & Immunization History Health Maintenance  Topic Date Due  . Hepatitis C Screening  04/12/2020 (Originally January 17, 1946)  . TETANUS/TDAP  12/04/2021  . Fecal DNA (Cologuard)  11/06/2022  . INFLUENZA VACCINE  Completed  . DEXA SCAN  Completed  . COVID-19 Vaccine  Completed  . PNA vac Low Risk Adult  Completed   Immunization History  Administered Date(s)  Administered  . Fluad Quad(high Dose 65+) 10/30/2018  . Influenza, High Dose Seasonal PF 12/14/2016, 11/28/2017  . Influenza,inj,Quad PF,6+ Mos 12/07/2015  . Influenza-Unspecified 11/14/2019  . Moderna Sars-Covid-2 Vaccination 04/17/2019, 05/15/2019, 12/24/2019  . Pneumococcal Conjugate-13 07/01/2014  . Pneumococcal Polysaccharide-23 07/07/2010  . Tdap 08/18/2008, 12/05/2011  . Zoster 08/22/2010  . Zoster Recombinat (Shingrix) 09/22/2017, 03/11/2018    These are the patient goals that we discussed: Goals Addressed   None      This is a list of Health Maintenance Items that are overdue or due now: Hep C and HIV Screening  Orders/Referrals Placed Today: No orders of the defined types were placed in this encounter.  Follow-up Plan . Follow-up with Emeterio Reeve, DO as planned . Schedule your next medicare wellness exam in one year.

## 2020-02-27 NOTE — Progress Notes (Signed)
MEDICARE ANNUAL WELLNESS VISIT  02/27/2020  Telephone Visit Disclaimer This Medicare AWV was conducted by telephone due to national recommendations for restrictions regarding the COVID-19 Pandemic (e.g. social distancing).  I verified, using two identifiers, that I am speaking with [name] or their authorized healthcare agent. I discussed the limitations, risks, security, and privacy concerns of performing an evaluation and management service by telephone and the potential availability of an in-person appointment in the future. The patient expressed understanding and agreed to proceed.  Location of Patient: Home Location of Provider (nurse):  In the office.  Subjective:    [name] is a 75 y.o. female patient of Sunnie Nielsen, DO who had a Medicare Annual Wellness Visit today via telephone. [name] is Retired and lives with their spouse. she has 1 children. she reports that she is socially active and does interact with friends/family regularly. she is minimally physically active and enjoys reading.  Patient Care Team: Sunnie Nielsen, DO as PCP - General (Osteopathic Medicine)  Advanced Directives 02/27/2020 09/23/2019 05/20/2019 02/18/2018 09/20/2015 09/10/2015 08/04/2015  Does Patient Have a Medical Advance Directive? Yes No;Yes Yes Yes Yes Yes Yes  Type of Estate agent of [place];Living will Healthcare Power of [place];Living will Healthcare Power of [place];Living will Healthcare Power of [place];Out of facility DNR (pink MOST or yellow form);Living will Healthcare Power of [place];Living will Healthcare Power of [place];Living will Out of facility DNR (pink MOST or yellow form);Living will;Healthcare Power of Attorney  Does patient want to make changes to medical advance directive? No - Patient declined - - No - Patient declined - - No - Patient declined  Copy of Healthcare Power of Attorney in Chart? Yes - validated most recent copy  scanned in chart (See row information) Yes - validated most recent copy scanned in chart (See row information) Yes - validated most recent copy scanned in chart (See row information) No - copy requested Yes No - copy requested No - copy requested  Would patient like information on creating a medical advance directive? No - Patient declined - - - - - [facility] Utilization Over the Past 12 Months: # of hospitalizations or ER visits: 0 # of surgeries: 2  (hand surgery and cataract surgery, bilateral).  Review of Systems    Patient reports that her overall health is unchanged compared to last year.  History obtained from chart review and the patient  Patient Reported Readings (BP, Pulse, CBG, Weight, etc) none  Pain Assessment Pain : No/denies pain     Current Medications & Allergies (verified) Allergies as of 02/27/2020      Reactions   Penicillins Hives   Tolerates Cephalosporins   Sulfa Antibiotics Hives         Medication List       Accurate as of February 27, 2020  2:28 PM. If you have any questions, ask your nurse or doctor.        amLODipine-benazepril 5-20 MG capsule Commonly known as: LOTREL Take 1 capsule by mouth daily.   atorvastatin 20 MG tablet Commonly known as: LIPITOR Take 1 tablet (20 mg total) by mouth daily.   fluticasone 50 MCG/ACT nasal spray Commonly known as: FLONASE Place into both nostrils daily.   HYDROcodone-acetaminophen 5-325 MG tablet Commonly known as: Norco Take 1 tablet by mouth every 6 (six) hours as needed. To be taken post-op   ondansetron 4 MG tablet Commonly known as: Zofran Take 1 tablet (4 mg total)  by mouth every 8 (eight) hours as needed for nausea or vomiting.   Ozempic (0.25 or 0.5 MG/DOSE) 2 MG/1.5ML Sopn Generic drug: Semaglutide(0.25 or 0.5MG /DOS)   Ozempic (0.25 or 0.5 MG/DOSE) 2 MG/1.5ML Sopn Generic drug: Semaglutide(0.25 or 0.5MG /DOS) Inject 0.5 mg into the skin once a week.   raloxifene 60 MG  tablet Commonly known as: EVISTA Take 1 tablet (60 mg total) by mouth daily.   temazepam 22.5 MG capsule Commonly known as: Restoril Take 1 capsule (22.5 mg total) by mouth at bedtime as needed for sleep.   triamterene-hydrochlorothiazide 37.5-25 MG capsule Commonly known as: DYAZIDE Take 1 each (1 capsule total) by mouth daily.   venlafaxine XR 150 MG 24 hr capsule Commonly known as: EFFEXOR-XR Take 1 capsule (150 mg total) by mouth daily with breakfast.       History (reviewed): Past Medical History:  Diagnosis Date  . Anxiety   . Depression   . Headache    SINUS  . Hyperlipidemia   . Hypertension   . Osteopenia   . Primary localized osteoarthritis of right knee    Past Surgical History:  Procedure Laterality Date  . BREAST EXCISIONAL BIOPSY Right    benign cyst removal   . CARPAL TUNNEL RELEASE Right 2015   Ortho in [place]  . LASIK    . MENISCUS REPAIR Right 2011   Orthopedic in [place]  . TOTAL KNEE ARTHROPLASTY Right 09/20/2015  . TOTAL KNEE ARTHROPLASTY Right 09/20/2015   Procedure: TOTAL KNEE ARTHROPLASTY;  Surgeon: Elsie Saas, MD;  Location: [facility];  Service: Orthopedics;  Laterality: Right;  . Trigger Thumb Right 2015   Done in [facility] at the same time as the carpal tunnel release  . TUBAL LIGATION  1980   Family History  Problem Relation Age of Onset  . Heart failure Mother   . Heart attack Mother   . Alcohol abuse Maternal Aunt   . Heart attack Father   . Irregular heart beat Sister   . Hypertension Sister   . Heart disease Brother    Social History   Socioeconomic History  . Marital status: Married    Spouse name: [name]  . Number of children: 1  . Years of education: 17.5  . Highest education level: Master's degree (e.g., MA, MS, MEng, MEd, MSW, MBA)  Occupational History  . Occupation: retired    Comment: Licensed conveyancer  Tobacco Use  . Smoking status: Never Smoker  . Smokeless tobacco: Never Used  Vaping Use  . Vaping Use: Never used   Substance and Sexual Activity  . Alcohol use: No    Comment: 1 glass a month  . Drug use: No  . Sexual activity: Yes  Other Topics Concern  . Not on file  Social History Narrative   Reads paper, walks, housework errands. Caffeine use daily   Social Determinants of Health   Financial Resource Strain: Low Risk   . Difficulty of Paying Living Expenses: Not hard at all  Food Insecurity: No Food Insecurity  . Worried About Charity fundraiser in the Last Year: Never true  . Ran Out of Food in the Last Year: Never true  Transportation Needs: No Transportation Needs  . Lack of Transportation (Medical): No  . Lack of Transportation (Non-Medical): No  Physical Activity: Inactive  . Days of Exercise per Week: 0 days  . Minutes of Exercise per Session: 0 min  Stress: No Stress Concern Present  . Feeling of Stress : Not at all  Social Connections: Moderately Isolated  . Frequency of Communication with Friends and Family: Three times a week  . Frequency of Social Gatherings with Friends and Family: Once a week  . Attends Religious Services: Never  . Active Member of Clubs or Organizations: No  . Attends Archivist Meetings: Never  . Marital Status: Married    Activities of Daily Living In your present state of health, do you have any difficulty performing the following activities: 02/27/2020  Hearing? Y  Comment uses bilateral hearing aids  Vision? N  Difficulty concentrating or making decisions? N  Walking or climbing stairs? N  Dressing or bathing? N  Doing errands, shopping? N  Preparing Food and eating ? N  Using the Toilet? N  In the past six months, have you accidently leaked urine? N  Do you have problems with loss of bowel control? N  Managing your Medications? N  Managing your Finances? N  Housekeeping or managing your Housekeeping? N  Some recent data might be hidden    Patient Education/ Literacy How often do you need to have someone help you when you  read instructions, pamphlets, or other written materials from your doctor or pharmacy?: 1 - Never What is the last grade level you completed in school?: Masters degree  Exercise Current Exercise Habits: The patient has a physically strenuous job, but has no regular exercise apart from work., Exercise limited by: None identified  Diet Patient reports consuming 3 meals a day and 2 snack(s) a day Patient reports that her primary diet is: Regular Patient reports that she does have regular access to food.   Depression Screen PHQ 2/9 Scores 02/27/2020 09/23/2019 05/20/2019 02/18/2018 09/17/2017 12/28/2016 02/15/2016  PHQ - 2 Score 0 1 0 0 1 2 3   PHQ- 9 Score 0 - - - 4 5 12   Some encounter information is confidential and restricted. Go to Review Flowsheets activity to see all data.     Fall Risk Fall Risk  02/27/2020 05/20/2019 02/18/2018 01/02/2018 01/18/2017  Falls in the past year? 0 0 0 0 No  Comment - - - Emmi Telephone Survey: data to providers prior to load [employer] Telephone Survey: data to providers prior to load  Number falls in past yr: 0 0 - - -  Injury with Fall? 0 - - - -  Risk for fall due to : No Fall Risks - - - -  Follow up Falls evaluation completed - Falls evaluation completed - -     Objective:  [name] seemed alert and oriented and she participated appropriately during our telephone visit.  Blood Pressure Weight BMI  BP Readings from Last 3 Encounters:  01/01/20 133/75  06/30/19 136/75  05/20/19 (!) 171/81   Wt Readings from Last 3 Encounters:  01/01/20 181 lb 0.6 oz (82.1 kg)  09/23/19 195 lb (88.5 kg)  08/08/19 195 lb (88.5 kg)   BMI Readings from Last 1 Encounters:  01/01/20 33.11 kg/m    *Unable to obtain current vital signs, weight, and BMI due to telephone visit type  Hearing/Vision  . [name] did not seem to have difficulty with hearing/understanding during the telephone conversation . Reports that she has had a formal eye exam by an eye care professional  within the past year . Reports that she has not had a formal hearing evaluation within the past year. She does wear hearing aids in both ears. *Unable to fully assess hearing and vision during telephone visit type  Cognitive Function:  6CIT Screen 02/27/2020 02/18/2018  What Year? 0 points 0 points  What month? 0 points 0 points  What time? 0 points 0 points  Count back from 20 0 points 2 points  Months in reverse 0 points 0 points  Repeat phrase 0 points 0 points  Total Score 0 2   (Normal:0-7, Significant for Dysfunction: >8)  Normal Cognitive Function Screening: Yes   Immunization & Health Maintenance Record Immunization History  Administered Date(s) Administered  . Fluad Quad(high Dose 65+) 10/30/2018  . Influenza, High Dose Seasonal PF 12/14/2016, 11/28/2017  . Influenza,inj,Quad PF,6+ Mos 12/07/2015  . Influenza-Unspecified 11/14/2019  . Moderna Sars-Covid-2 Vaccination 04/17/2019, 05/15/2019, 12/24/2019  . Pneumococcal Conjugate-13 07/01/2014  . Pneumococcal Polysaccharide-23 07/07/2010  . Tdap 08/18/2008, 12/05/2011  . Zoster 08/22/2010  . Zoster Recombinat (Shingrix) 09/22/2017, 03/11/2018    Health Maintenance  Topic Date Due  . Hepatitis C Screening  04/12/2020 (Originally 29-Nov-1945)  . TETANUS/TDAP  12/04/2021  . Fecal DNA (Cologuard)  11/06/2022  . INFLUENZA VACCINE  Completed  . DEXA SCAN  Completed  . COVID-19 Vaccine  Completed  . PNA vac Low Risk Adult  Completed       Assessment  This is a routine wellness examination for [employer].  Health Maintenance: Due or Overdue Hep C screening and HIV screening.  [name] does not need a referral for Community Assistance: Care Management:   no Social Work:    no Prescription Assistance:  no Nutrition/Diabetes Education:  no   Plan:  Personalized Goals Goals Addressed   None    Personalized Health Maintenance & Screening Recommendations  Hep C and HIV screening  Lung Cancer  Screening Recommended: no (Low Dose CT Chest recommended if Age 21-80 years, 30 pack-year currently smoking OR have quit w/in past 15 years) Hepatitis C Screening recommended: yes HIV Screening recommended: yes  Advanced Directives: Written information was not prepared per patient's request.  Referrals & Orders No orders of the defined types were placed in this encounter.   Follow-up Plan . Follow-up with Emeterio Reeve, DO as planned . Schedule your next medicare wellness exam in one year.    I have personally reviewed and noted the following in the patient's chart:   . Medical and social history . Use of alcohol, tobacco or illicit drugs  . Current medications and supplements . Functional ability and status . Nutritional status . Physical activity . Advanced directives . List of other physicians . Hospitalizations, surgeries, and ER visits in previous 12 months . Vitals . Screenings to include cognitive, depression, and falls . Referrals and appointments  In addition, I have reviewed and discussed with [name] certain preventive protocols, quality metrics, and best practice recommendations. A written personalized care plan for preventive services as well as general preventive health recommendations is available and can be mailed to the patient at her request.      Pieter Partridge  02/27/2020

## 2020-03-02 ENCOUNTER — Other Ambulatory Visit (HOSPITAL_COMMUNITY): Payer: Self-pay | Admitting: Psychiatry

## 2020-03-02 DIAGNOSIS — F5101 Primary insomnia: Secondary | ICD-10-CM

## 2020-03-24 ENCOUNTER — Other Ambulatory Visit: Payer: Self-pay | Admitting: Osteopathic Medicine

## 2020-03-29 ENCOUNTER — Other Ambulatory Visit (HOSPITAL_COMMUNITY): Payer: Self-pay | Admitting: Psychiatry

## 2020-03-29 DIAGNOSIS — F5101 Primary insomnia: Secondary | ICD-10-CM

## 2020-03-31 ENCOUNTER — Telehealth (HOSPITAL_COMMUNITY): Payer: Self-pay | Admitting: *Deleted

## 2020-03-31 ENCOUNTER — Other Ambulatory Visit (HOSPITAL_COMMUNITY): Payer: Self-pay | Admitting: Psychiatry

## 2020-03-31 NOTE — Telephone Encounter (Signed)
Call from patient needing a new rx for her Temazepam. Will ask Dr Ronne Binning to call it in to [facility]. She should be out on the 18th of Feb.

## 2020-03-31 NOTE — Telephone Encounter (Signed)
Medications filled and sent to preferred pharmacy.

## 2020-04-05 ENCOUNTER — Other Ambulatory Visit: Payer: Self-pay | Admitting: Osteopathic Medicine

## 2020-04-14 ENCOUNTER — Other Ambulatory Visit: Payer: Self-pay

## 2020-04-14 ENCOUNTER — Encounter: Payer: Self-pay | Admitting: Neurology

## 2020-04-14 ENCOUNTER — Ambulatory Visit (INDEPENDENT_AMBULATORY_CARE_PROVIDER_SITE_OTHER): Payer: Medicare Other | Admitting: Neurology

## 2020-04-14 VITALS — BP 117/67 | HR 82 | Ht 62.0 in | Wt 167.0 lb

## 2020-04-14 DIAGNOSIS — F429 Obsessive-compulsive disorder, unspecified: Secondary | ICD-10-CM | POA: Diagnosis not present

## 2020-04-14 DIAGNOSIS — F5105 Insomnia due to other mental disorder: Secondary | ICD-10-CM | POA: Diagnosis not present

## 2020-04-14 DIAGNOSIS — F419 Anxiety disorder, unspecified: Secondary | ICD-10-CM | POA: Diagnosis not present

## 2020-04-14 NOTE — Patient Instructions (Signed)

## 2020-04-14 NOTE — Progress Notes (Signed)
SLEEP MEDICINE CLINIC    Provider:  Larey Seat, MD  Primary Care Physician:  Emeterio Reeve, [facility] [address] [address] [place] [address]     Referring Provider: Florinda Marker [place] [address] [place],  [place] [address]          Chief Complaint according to patient   Patient presents with:    . New Patient (Initial Visit)     Insomnia, hearing loss, snoring.       HISTORY OF PRESENT ILLNESS:  [name] is a 75 y.o. year old White or Caucasian female patient seen here as a referral on 04/14/2020 from Insomnia, obsessive compulsive thoughts, snoring. Sinus.  Chief concern according to patient :  Presents today stating that she has struggled with insomnia off and on for several years. Never had a SS. States she things it most likely is related to stress and she has followed with psych with not complete success. She describes having sinus trouble which causes her to snore at night. As of this moment she is sleeping well. She suffers with "anxiety and claustrophobia and has no desire to start CPAP no thinks she needs it."    [name]  has a past medical history of Anxiety, Depression, Headache, Hyperlipidemia, Hypertension, Osteopenia, and Primary localized osteoarthritis of right knee. She lost 40 pounds after discontinuing Mirtazapine, starting on Ozempic. She is now constipated.  She has struggled with OCD and anxiety and she has seen psychiatry and 2 psychologist. She is worried about nocturia, 2-3 times.  The patient never had a sleep study. She fears she couldn't sleep in a lab.    Sleep relevant medical history: Insomnia, is non- cyclic, stress, OCD.  Nocturia/I dysphonia,  Allergy testing was negative. Family medical /sleep history: Noother family member on CPAP with OSA, mother had insomnia, no sleep walkers.    Social history:  Patient is retired from Licensed conveyancer and lives in a household with spouse, one adult child, 3  grands.  Tobacco use; never .  ETOH use ; rarely, Caffeine intake in form of Coffee( /) Soda( coke 3-4 a day) Tea ( /) or energy drinks. Regular exercise in form of walking.     Sleep habits are as follows: The patient's dinner time is between 6-6.30 PM. The patient goes to bed and reads until  at 11-12 PM and continues to sleep for 7-8 hours, wakes for 2-3 bathroom breaks. Sometimes having trouble to go back to sleep - no RLS, no Headaches, sinus congestion. The preferred sleep position is right sided , with the support of 1-2 pillows. Dreams are reportedly frequent - some are vivid, non enacting.  8  AM is the usual rise time. The patient wakes up spontaneously.  She reports some times feeling refreshed or restored in AM, with symptoms such as dry mouth- no morning headaches, but sinus congestion.phlegm.  Naps are taken infrequently.    Review of Systems: Out of a complete 14 system review, the patient complains of only the following symptoms, and all other reviewed systems are negative.:  Fatigue, sleepiness , snoring, fragmented sleep, Insomnia- not right now, but recurrent.   How likely are you to doze in the following situations: 0 = not likely, 1 = slight chance, 2 = moderate chance, 3 = high chance   Sitting and Reading? Watching Television? Sitting inactive in a public place (theater or meeting)? As a passenger in a car for an hour  without a break? Lying down in the afternoon when circumstances permit? Sitting and talking to someone? Sitting quietly after lunch without alcohol? In a car, while stopped for a few minutes in traffic?   Total = 1/ 24 points   FSS endorsed at 26/ 63 points.   Social History   Socioeconomic History  . Marital status: Married    Spouse name: Purcell Nails  . Number of children: 1  . Years of education: 17.5  . Highest education level: Master's degree (e.g., MA, MS, MEng, MEd, MSW, MBA)  Occupational History  . Occupation: retired    Comment:  Licensed conveyancer  Tobacco Use  . Smoking status: Never Smoker  . Smokeless tobacco: Never Used  Vaping Use  . Vaping Use: Never used  Substance and Sexual Activity  . Alcohol use: No    Comment: 1 glass a month  . Drug use: No  . Sexual activity: Yes  Other Topics Concern  . Not on file  Social History Narrative   Reads paper, walks, housework errands. Caffeine use daily   Social Determinants of Health   Financial Resource Strain: Low Risk   . Difficulty of Paying Living Expenses: Not hard at all  Food Insecurity: No Food Insecurity  . Worried About Charity fundraiser in the Last Year: Never true  . Ran Out of Food in the Last Year: Never true  Transportation Needs: No Transportation Needs  . Lack of Transportation (Medical): No  . Lack of Transportation (Non-Medical): No  Physical Activity: Inactive  . Days of Exercise per Week: 0 days  . Minutes of Exercise per Session: 0 min  Stress: No Stress Concern Present  . Feeling of Stress : Not at all  Social Connections: Moderately Isolated  . Frequency of Communication with Friends and Family: Three times a week  . Frequency of Social Gatherings with Friends and Family: Once a week  . Attends Religious Services: Never  . Active Member of Clubs or Organizations: No  . Attends Archivist Meetings: Never  . Marital Status: Married    Family History  Problem Relation Age of Onset  . Heart failure Mother   . Heart attack Mother   . Alcohol abuse Maternal Aunt   . Heart attack Father   . Irregular heart beat Sister   . Hypertension Sister   . Heart disease Brother     Past Medical History:  Diagnosis Date  . Anxiety   . Depression   . Headache    SINUS  . Hyperlipidemia   . Hypertension   . Osteopenia   . Primary localized osteoarthritis of right knee     Past Surgical History:  Procedure Laterality Date  . BREAST EXCISIONAL BIOPSY Right    benign cyst removal   . CARPAL TUNNEL RELEASE Right 2015   Ortho  in [place]  . LASIK    . MENISCUS REPAIR Right 2011   Orthopedic in [place]  . TOTAL KNEE ARTHROPLASTY Right 09/20/2015  . TOTAL KNEE ARTHROPLASTY Right 09/20/2015   Procedure: TOTAL KNEE ARTHROPLASTY;  Surgeon: Elsie Saas, MD;  Location: [facility];  Service: Orthopedics;  Laterality: Right;  . Trigger Thumb Right 2015   Done in [facility] at the same time as the carpal tunnel release  . TUBAL LIGATION  1980     Current Outpatient Medications on File Prior to Visit  Medication Sig Dispense Refill  . amLODipine-benazepril (LOTREL) 5-20 MG capsule Take 1 capsule by mouth daily. 90 capsule 3  .  atorvastatin (LIPITOR) 20 MG tablet Take 1 tablet (20 mg total) by mouth daily. 90 tablet 3  . clonazePAM (KLONOPIN) 0.5 MG tablet TAKE 1/2 TO 1 TABLET BY MOUTH TWO TIMES A DAY AS NEEDED FOR ANXIETY (DO NOT TAKE WITH TEMAZEPAM OR AMBIEN) 60 tablet 2  . fluticasone (FLONASE) 50 MCG/ACT nasal spray Place into both nostrils daily.    . raloxifene (EVISTA) 60 MG tablet Take 1 tablet (60 mg total) by mouth daily. 90 tablet 3  . Semaglutide,0.25 or 0.5MG /DOS, (OZEMPIC, 0.25 OR 0.5 MG/DOSE,) 2 MG/1.5ML SOPN Inject 0.5 mg into the skin once a week. 7.5 mL 5  . temazepam (RESTORIL) 22.5 MG capsule TAKE ONE CAPSULE BY MOUTH EVERY NIGHT AT BEDTIME AS NEEDED FOR SLEEP 15 capsule 2  . triamterene-hydrochlorothiazide (DYAZIDE) 37.5-25 MG capsule TAKE ONE CAPSULE BY MOUTH ONE TIME A DAY 90 capsule 0  . venlafaxine XR (EFFEXOR-XR) 150 MG 24 hr capsule Take 1 capsule (150 mg total) by mouth daily with breakfast. 90 capsule 2   No current facility-administered medications on file prior to visit.    Allergies  Allergen Reactions  . Penicillins Hives    Tolerates Cephalosporins   . Sulfa Antibiotics Hives         Physical exam:  Today's Vitals   04/14/20 1023  BP: 117/67  Pulse: 82  Weight: 167 lb (75.8 kg)  Height: 5\' 2"  (1.575 m)   Body mass index is 30.54 kg/m.   Wt Readings from Last 3 Encounters:   04/14/20 167 lb (75.8 kg)  01/01/20 181 lb 0.6 oz (82.1 kg)  09/23/19 195 lb (88.5 kg)     Ht Readings from Last 3 Encounters:  04/14/20 5\' 2"  (1.575 m)  08/08/19 5\' 2"  (1.575 m)  05/20/19 5\' 2"  (1.575 m)      General: The patient is awake, alert and appears not in acute distress. The patient is well groomed. Head: Normocephalic, atraumatic. Neck is supple. Mallampati 2,  neck circumference:16.5  inches . Nasal airflow partially patent.  Retrognathia is not  seen. Left upper lip is drooping.  Dental status: biological  Cardiovascular:  Regular rate and cardiac rhythm by pulse,  without distended neck veins. Respiratory: Lungs are clear to auscultation.  Skin:  Without evidence of ankle edema, or rash. Trunk: The patient's posture is erect.   Neurologic exam : The patient is awake and alert, oriented to place and time.   Memory subjective described as intact.  Attention span & concentration ability appears normal.  Speech is fluent,  without  dysarthria, dysphonia or aphasia.  Mood and affect are appropriate.   Cranial nerves: no loss of smell or taste reported  Pupils are equal and briskly reactive to light. Funduscopic exam deferred.   Extraocular movements in vertical and horizontal planes were intact and without nystagmus. No Diplopia. Visual fields by finger perimetry are intact. Hearing was intact to soft voice and finger rubbing.    Facial sensation intact to fine touch.  Facial motor strength is symmetric and tongue and uvula move midline.  Neck ROM : rotation, tilt and flexion extension were normal for age and shoulder shrug was symmetrical.    Motor exam:  Symmetric bulk, tone and ROM.   Normal tone without cog- wheeling, and symmetrically attenuated  grip strength . Arthritic hands, limited finger ROM.    Sensory:  Fine touch, pinprick and vibration were  normal.  Proprioception tested in the upper extremities was normal.   Coordination: Rapid alternating  movements  in the fingers/hands were of slightly reduced speed. Change in handwriting.  The Finger-to-nose maneuver was notably affected  withevidence of dysmetria but notable action and not resting tremor.   Gait and station: Patient could rise unassisted from a seated position, walked without assistive device.  Stance is of normal width/ base . Toe and heel walk were deferred.  Deep tendon reflexes: in the  upper and lower extremities are symmetric and intact.  Babinski response was deferred.     After spending a total time of  45 minutes face to face and additional time for physical and neurologic examination, review of laboratory studies,  personal review of imaging studies, reports and results of other testing and review of referral information / records as far as provided in visit, I have established the following assessments:  Known OCD, anxiety , snoring and  recurrent phases of insomnia.   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   2) snoring justifies at least ruling out OSA by HST.   3) dysphonia, action tremor- essential tremor? No falls, no RLS no REM BD .   My Plan is to proceed with:  1) HST.   I would like to thank Emeterio Reeve, DO and Emeterio Reeve, [facility] [place] [address] [place],  [place] [address] for allowing me to meet with and to take care of this pleasant patient.   In short, [name] is presenting with insomnia which may or may not be related to an organic sleep disorder for which I will screen her.  I plan to follow up either personally or through our NP within 3 month.     Electronically signed by: Larey Seat, MD 04/14/2020 10:43 AM  [facility] Board certified by The [employer] of Sleep Medicine and Diplomate of the [employer] of Sleep Medicine. Board certified In Neurology through the [employer], Fellow of the [employer] of Neurology. Medical Director of [employer].

## 2020-04-29 ENCOUNTER — Encounter (HOSPITAL_COMMUNITY): Payer: Self-pay | Admitting: Psychiatry

## 2020-04-29 ENCOUNTER — Ambulatory Visit (INDEPENDENT_AMBULATORY_CARE_PROVIDER_SITE_OTHER): Payer: Medicare Other | Admitting: Psychiatry

## 2020-04-29 ENCOUNTER — Other Ambulatory Visit: Payer: Self-pay

## 2020-04-29 DIAGNOSIS — F331 Major depressive disorder, recurrent, moderate: Secondary | ICD-10-CM | POA: Diagnosis not present

## 2020-04-29 DIAGNOSIS — F418 Other specified anxiety disorders: Secondary | ICD-10-CM | POA: Diagnosis not present

## 2020-04-29 DIAGNOSIS — F5101 Primary insomnia: Secondary | ICD-10-CM

## 2020-04-29 MED ORDER — TEMAZEPAM 22.5 MG PO CAPS
ORAL_CAPSULE | ORAL | 2 refills | Status: DC
Start: 2020-04-29 — End: 2020-05-06

## 2020-04-29 MED ORDER — VENLAFAXINE HCL ER 150 MG PO CP24: 150.0000 mg | ORAL_CAPSULE | Freq: Every day | ORAL | 2 refills | Status: DC

## 2020-04-29 NOTE — Progress Notes (Signed)
BH MD/PA/NP OP Progress Note    04/29/2020 9:79 PM [name]  MRN:  [id number]  Chief Complaint:  "Can we increase the temazepam to 30 mg.  It is cheaper than the 22.5 mg"  HPI: 75 year old female seen today for follow up psychiatric evaluation.   She has a psychiatric history of insomnia, anxiety, depression, and passive SI.  She is currently being managed on, Effexor 150 mg daily, Klonopin 0.5 twice daily (perscribed by PCP), and Restoril 22.5 mg nightly. She notes her medications are effective in managing her psychiatric conditions.  Today she is pleasant, cooperative, engaged in conversation, and maintained eye contact.  She informed Probation officer that she is sleeping better and notes that she has very minimal anxiety and depression. Patient asked provider if temazepam could be increased to 30 mg because it is cheaper than a 22.5 mg tablet.  Provider informed patient that at this time her medication would not be increased.  Provider informed patient that she is currently on Klonopin and Restoril which has a potential of causing respiratory distress.  Provider also informed patient that she wants Restoril to be used short term.  She endorsed understanding and agreed.   Today provider conducted a GAD 7 and patient scored a 6, at her last visit she scored a 5. Provider also conducted a PHQ 9 and patient scored a 2, at last visit she scored an 4. Today denies SI/HI/VAH or paranoia.  She notes that she sleeps approximately 6 to 8 hours nightly.  Patient notes that she will follow-up for sleep study in a few weeks.  Patient notes that since discontinuing mirtazapine and starting Ozempic she has lost over 30 pounds.  She notes that she physically feels better and notes mentally she feels stable.  No medication changes made today. Patient agreeable to continue  other medications as prescribed.  No other concerns noted at this time.  Visit Diagnosis:    ICD-10-CM   1. Primary insomnia  F51.01  temazepam (RESTORIL) 22.5 MG capsule  2. Anxiety associated with depression  F41.8 venlafaxine XR (EFFEXOR-XR) 150 MG 24 hr capsule  3. Moderate episode of recurrent major depressive disorder (HCC)  F33.1 venlafaxine XR (EFFEXOR-XR) 150 MG 24 hr capsule    Past Psychiatric History:  insomnia, anxiety, depression, and passive SI.  Past Medical History:  Past Medical History:  Diagnosis Date  . Anxiety   . Depression   . Headache    SINUS  . Hyperlipidemia   . Hypertension   . Osteopenia   . Primary localized osteoarthritis of right knee     Past Surgical History:  Procedure Laterality Date  . BREAST EXCISIONAL BIOPSY Right    benign cyst removal   . CARPAL TUNNEL RELEASE Right 2015   Ortho in [place]  . LASIK    . MENISCUS REPAIR Right 2011   Orthopedic in [place]  . TOTAL KNEE ARTHROPLASTY Right 09/20/2015  . TOTAL KNEE ARTHROPLASTY Right 09/20/2015   Procedure: TOTAL KNEE ARTHROPLASTY;  Surgeon: Elsie Saas, MD;  Location: [facility];  Service: Orthopedics;  Laterality: Right;  . Trigger Thumb Right 2015   Done in [facility] at the same time as the carpal tunnel release  . TUBAL LIGATION  1980    Family Psychiatric History: Mother anxiety, maternal aunts alcohol use, maternal aunt anorexia Family History:  Family History  Problem Relation Age of Onset  . Heart failure Mother   . Heart attack Mother   . Alcohol abuse Maternal Aunt   .  Heart attack Father   . Irregular heart beat Sister   . Hypertension Sister   . Heart disease Brother     Social History:  Social History   Socioeconomic History  . Marital status: Married    Spouse name: Purcell Nails  . Number of children: 1  . Years of education: 17.5  . Highest education level: Master's degree (e.g., MA, MS, MEng, MEd, MSW, MBA)  Occupational History  . Occupation: retired    Comment: Licensed conveyancer  Tobacco Use  . Smoking status: Never Smoker  . Smokeless tobacco: Never Used  Vaping Use  . Vaping Use: Never used   Substance and Sexual Activity  . Alcohol use: No    Comment: 1 glass a month  . Drug use: No  . Sexual activity: Yes  Other Topics Concern  . Not on file  Social History Narrative   Reads paper, walks, housework errands. Caffeine use daily   Social Determinants of Health   Financial Resource Strain: Low Risk   . Difficulty of Paying Living Expenses: Not hard at all  Food Insecurity: No Food Insecurity  . Worried About Charity fundraiser in the Last Year: Never true  . Ran Out of Food in the Last Year: Never true  Transportation Needs: No Transportation Needs  . Lack of Transportation (Medical): No  . Lack of Transportation (Non-Medical): No  Physical Activity: Inactive  . Days of Exercise per Week: 0 days  . Minutes of Exercise per Session: 0 min  Stress: No Stress Concern Present  . Feeling of Stress : Not at all  Social Connections: Moderately Isolated  . Frequency of Communication with Friends and Family: Three times a week  . Frequency of Social Gatherings with Friends and Family: Once a week  . Attends Religious Services: Never  . Active Member of Clubs or Organizations: No  . Attends Archivist Meetings: Never  . Marital Status: Married    Allergies:  Allergies  Allergen Reactions  . Penicillins Hives    Tolerates Cephalosporins   . Sulfa Antibiotics Hives         Metabolic Disorder Labs: Lab Results  Component Value Date   HGBA1C 6.5 (A) 01/01/2020   MPG 131 01/17/2019   MPG 128 11/28/2017   No results found for: PROLACTIN Lab Results  Component Value Date   CHOL 157 06/30/2019   TRIG 161 (H) 06/30/2019   HDL 56 06/30/2019   CHOLHDL 2.8 06/30/2019   [place] 75 06/30/2019   [place] 81 01/17/2019   Lab Results  Component Value Date   TSH 4.01 01/08/2017   TSH 1.93 12/07/2015    Therapeutic Level Labs: No results found for: LITHIUM No results found for: VALPROATE No components found for:  CBMZ  Current Medications: Current  Outpatient Medications  Medication Sig Dispense Refill  . amLODipine-benazepril (LOTREL) 5-20 MG capsule Take 1 capsule by mouth daily. 90 capsule 3  . atorvastatin (LIPITOR) 20 MG tablet Take 1 tablet (20 mg total) by mouth daily. 90 tablet 3  . clonazePAM (KLONOPIN) 0.5 MG tablet TAKE 1/2 TO 1 TABLET BY MOUTH TWO TIMES A DAY AS NEEDED FOR ANXIETY (DO NOT TAKE WITH TEMAZEPAM OR AMBIEN) 60 tablet 2  . fluticasone (FLONASE) 50 MCG/ACT nasal spray Place into both nostrils daily.    . raloxifene (EVISTA) 60 MG tablet Take 1 tablet (60 mg total) by mouth daily. 90 tablet 3  . Semaglutide,0.25 or 0.5MG /DOS, (OZEMPIC, 0.25 OR 0.5 MG/DOSE,) 2 MG/1.5ML SOPN Inject  0.5 mg into the skin once a week. 7.5 mL 5  . temazepam (RESTORIL) 22.5 MG capsule TAKE ONE CAPSULE BY MOUTH EVERY NIGHT AT BEDTIME AS NEEDED FOR SLEEP 30 capsule 2  . triamterene-hydrochlorothiazide (DYAZIDE) 37.5-25 MG capsule TAKE ONE CAPSULE BY MOUTH ONE TIME A DAY 90 capsule 0  . venlafaxine XR (EFFEXOR-XR) 150 MG 24 hr capsule Take 1 capsule (150 mg total) by mouth daily with breakfast. 90 capsule 2   No current facility-administered medications for this visit.     Musculoskeletal: Strength & Muscle Tone: within normal limits Gait & Station: normal Patient leans: N/A  Psychiatric Specialty Exam: Review of Systems  Blood pressure 137/63, pulse 98, height 5\' 2"  (1.575 m), weight 168 lb (76.2 kg), SpO2 100 %.Body mass index is 30.73 kg/m.  General Appearance: Well Groomed  Eye Contact:  Good  Speech:  Clear and Coherent and Normal Rate  Volume:  Normal  Mood:  Euthymic  Affect:  Congruent  Thought Process:  Coherent, Goal Directed and Linear  Orientation:  Full (Time, Place, and Person)  Thought Content: WDL and Logical   Suicidal Thoughts:  No  Homicidal Thoughts:  No  Memory:  Immediate;   Good Recent;   Good Remote;   Good  Judgement:  Good  Insight:  Good  Psychomotor Activity:  Normal  Concentration:   Concentration: Good and Attention Span: Good  Recall:  Good  Fund of Knowledge: Good  Language: Good  Akathisia:  No  Handed:  Right  AIMS (if indicated): Not done  Assets:  Communication Skills Desire for Improvement Financial Resources/Insurance Housing Social Support  ADL's:  Intact  Cognition: WNL  Sleep:  Good   Screenings: GAD-7   Flowsheet Row Clinical Support from 04/29/2020 in [facility] Video Visit from 01/30/2020 in [facility] Office Visit from 09/17/2017 in [place] Office Visit from 02/15/2016 in [facility] Visit from 01/18/2016 in [place]  Total GAD-7 Score 6 5 2 11 19     PHQ2-9   [place] from 04/29/2020 in [facility] Office Visit from 02/27/2020 in [place] Video Visit from 01/30/2020 in [facility] Office Visit from 12/01/2019 in [facility] Video Visit from 09/23/2019 in [place]  PHQ-2 Total Score 1 0 1 4 1   PHQ-9 Total Score 2 0 4 11 --    Flowsheet Row Clinical Support from 04/29/2020 in [place] No Risk       Assessment and Plan: Patient reports that she is doing well on her current medication regimen. No medication changes made today. Patient agreeable to continue all medications as prescribed.  1. Anxiety associated with depression Continue- venlafaxine XR (EFFEXOR-XR) 150 MG 24 hr capsule; Take 1 capsule (150 mg total) by mouth daily with breakfast.  Dispense: 90 capsule; Refill: 2  2. Moderate episode of recurrent major depressive disorder (HCC)  Continue- venlafaxine XR (EFFEXOR-XR) 150 MG 24 hr capsule; Take 1 capsule (150 mg total) by mouth  daily with breakfast.  Dispense: 90 capsule; Refill: 2  3. Primary insomnia  Continue- temazepam (RESTORIL) 22.5 MG capsule; Take 1 capsule (22.5 mg total) by mouth at bedtime as needed for sleep.  Dispense: 15 capsule; Refill: 0  Follow-up in 3 months   Salley Slaughter, NP 04/29/2020, 1:22 PM

## 2020-05-06 ENCOUNTER — Telehealth (HOSPITAL_COMMUNITY): Payer: Self-pay | Admitting: *Deleted

## 2020-05-06 ENCOUNTER — Other Ambulatory Visit (HOSPITAL_COMMUNITY): Payer: Self-pay | Admitting: Psychiatry

## 2020-05-06 DIAGNOSIS — F5101 Primary insomnia: Secondary | ICD-10-CM

## 2020-05-06 MED ORDER — TEMAZEPAM 30 MG PO CAPS: ORAL_CAPSULE | ORAL | 0 refills | Status: DC

## 2020-05-06 NOTE — Telephone Encounter (Signed)
Patient called & LVM that since Sunday she hasn't slept @ all. And that she is starting to  feel desperate. Patient requested to speak with provider & a call back.

## 2020-05-06 NOTE — Telephone Encounter (Signed)
Provider spoke to patient who notes that her sleep continues to worsen. She notes that she sleeps 3 or less hours nightly. She notes in the past her provider gave her Ambien for 3 months and then Restoril to help manage her sleep. Provider asked patient if she has had her sleep study and she notes that she will have it done on 05/09/2020. Provider collaborated with Jean Rosenthal to discuss options. After collaboration provider recommended that Restoril be increased to 30 mg for one month and then reduced after her sleep study. Patient was agreeable to this. No other concerns noted at this time.

## 2020-05-10 ENCOUNTER — Ambulatory Visit (INDEPENDENT_AMBULATORY_CARE_PROVIDER_SITE_OTHER): Payer: Medicare Other | Admitting: Neurology

## 2020-05-10 DIAGNOSIS — G4733 Obstructive sleep apnea (adult) (pediatric): Secondary | ICD-10-CM | POA: Diagnosis not present

## 2020-05-10 DIAGNOSIS — F5105 Insomnia due to other mental disorder: Secondary | ICD-10-CM

## 2020-05-10 DIAGNOSIS — F429 Obsessive-compulsive disorder, unspecified: Secondary | ICD-10-CM

## 2020-05-10 DIAGNOSIS — F419 Anxiety disorder, unspecified: Secondary | ICD-10-CM

## 2020-05-13 NOTE — Progress Notes (Signed)
   [facility] TEST (HST by Watch PAT)  STUDY DATA LOAD: 05/13/20  DOB: Sep 22, 1945  MRN: [id number]  ORDERING CLINICIAN: Larey Seat, MD   REFERRING CLINICIAN: Emeterio Reeve, DO   CLINICAL INFORMATION/HISTORY: [name] was seen on 04-14-20 , for chronic or recurrent insomnia,OCD related. Snoring and sinusitis. She has a medical history of Anxiety, Depression, Headache, Hyperlipidemia, Hypertension, Osteopenia, dysphonia, and Primary localized osteoarthritis of right knee. She lost 40 pounds after discontinuing Mirtazapine, starting on Ozempic. She is now constipated.  She has struggled with OCD and anxiety and she has seen psychiatry and 2 psychologist. She is worried about nocturia, which occurs 2-3 times. She fears she couldn't sleep in a lab. She is not able to nap, never reported hypersomnia but feels at times more fatigued, with an inner restlessness.     Epworth sleepiness score: 1/24. FSS at 26/63 points  BMI: 30.8 kg/m Neck Circumference:  16.5 "  FINDINGS:   Total Record Time (hours, min): 9 h 16 min Total Sleep Time (hours, min):  7 h 59 min   Percent REM (%):    11.88 %   Calculated pAHI (per hour):  6.9     REM pAHI: 10.9 NREM pAHI: 6.4 Supine AHI: N/A Oxygen Saturation (%) Mean: 84  Minimum oxygen saturation (%):        84   O2 Saturation Range (%): 84-99  O2Saturation (minutes) <=88%: 0.8 min   Pulse Mean (bpm):    61  Pulse Range (50-80)   IMPRESSION: This HST only documented mildest OSA (obstructive sleep apnea) , the pres dominant occurrence in REM sleep. No associated tachycardia, hypoxia.  The patient actually slept 8 hours with average sleep fragmentation.   RECOMMENDATION:  For this mild degree of uncomplicated apnea treatment is optional and could consist of a dental device, if the patient wishes to pursue.  I cannot correlate the patient's report of insomnia with the data seen here.  CPAP is not indicated.      INTERPRETING PHYSICIAN:  Larey Seat, MD 04/14/2020 10:43 AM  [facility] [employer] Board certified by [employer] of Sleep Medicine and Diplomate of the [employer] of Sleep Medicine. Board certified In Neurology through the [place], Fellow of the [employer] of Neurology. Medical Director of [employer].

## 2020-05-21 DIAGNOSIS — F429 Obsessive-compulsive disorder, unspecified: Secondary | ICD-10-CM | POA: Insufficient documentation

## 2020-05-21 NOTE — Procedures (Signed)
[facility] TEST (HST by Watch PAT)  STUDY DATA LOAD: 05/13/20  DOB: 31-Jan-1946  MRN: [id number]  ORDERING CLINICIAN: Larey Seat, MD   REFERRING CLINICIAN: Emeterio Reeve, DO   CLINICAL INFORMATION/HISTORY: [name] was seen on 04-14-20 , for chronic or recurrent insomnia,OCD related. Snoring and sinusitis. She has a medical history of Anxiety, Depression, Headache, Hyperlipidemia, Hypertension, Osteopenia, dysphonia, and Primary localized osteoarthritis of right knee. She lost 40 pounds after discontinuing Mirtazapine, starting on Ozempic. She is now constipated.  She has struggled with OCD and anxiety and she has seen psychiatry and 2 psychologist. She is worried about nocturia, which occurs 2-3 times. She fears she couldn't sleep in a lab. She is not able to nap, never reported hypersomnia but feels at times more fatigued, with an inner restlessness.     Epworth sleepiness score: 1/24. FSS at 26/63 points  BMI: 30.8 kg/m Neck Circumference:  16.5 "  FINDINGS:   Total Record Time (hours, min): 9 h 16 min Total Sleep Time (hours, min):  7 h 59 min   Percent REM (%):    11.88 %   Calculated pAHI (per hour):  6.9     REM pAHI: 10.9 NREM pAHI: 6.4 Supine AHI: N/A Oxygen Saturation (%) Mean: 84  Minimum oxygen saturation (%):        84   O2 Saturation Range (%): 84-99  O2Saturation (minutes) <=88%: 0.8 min   Pulse Mean (bpm):    61  Pulse Range (50-80)   IMPRESSION: This HST only documented mildest OSA (obstructive sleep apnea) , the pres dominant occurrence in REM sleep. No associated tachycardia, hypoxia.  The patient actually slept 8 hours with average sleep fragmentation.   RECOMMENDATION:  For this mild degree of uncomplicated apnea treatment is optional and could consist of a dental device, if the patient wishes to pursue.  I cannot correlate the patient's report of insomnia with the data seen here.  CPAP is not indicated.      INTERPRETING PHYSICIAN:  Larey Seat, MD 04/14/2020 10:43 AM  [facility] Board certified by [employer] of Sleep Medicine and Diplomate of the [employer] of Sleep Medicine. Board certified In Neurology through the [place], Fellow of the [employer] of Neurology. Medical Director of [employer].

## 2020-05-21 NOTE — Progress Notes (Signed)
IMPRESSION: This HST only documented mildest OSA (obstructive sleep apnea) , the pres dominant occurrence in REM sleep. No associated tachycardia, hypoxia.  The patient actually slept 8 hours with average sleep fragmentation.   RECOMMENDATION:  For this mild degree of uncomplicated apnea treatment is optional and could consist of a dental device, if the patient wishes to pursue.  I cannot correlate the patient's report of insomnia with the data seen here.  CPAP is not indicated.  Follow up with the sleep clinic is not needed.

## 2020-05-25 ENCOUNTER — Encounter: Payer: Self-pay | Admitting: Neurology

## 2020-05-25 ENCOUNTER — Telehealth: Payer: Self-pay | Admitting: Neurology

## 2020-05-25 NOTE — Telephone Encounter (Signed)
Called patient to discuss sleep study results. No answer at this time. LVM for the patient to call back.  Will send a mychart message as well. 

## 2020-05-25 NOTE — Telephone Encounter (Signed)
-----   Message from Larey Seat, MD sent at 05/21/2020  4:18 PM EDT ----- IMPRESSION: This HST only documented mildest OSA (obstructive sleep apnea) , the pres dominant occurrence in REM sleep. No associated tachycardia, hypoxia.  The patient actually slept 8 hours with average sleep fragmentation.   RECOMMENDATION:  For this mild degree of uncomplicated apnea treatment is optional and could consist of a dental device, if the patient wishes to pursue.  I cannot correlate the patient's report of insomnia with the data seen here.  CPAP is not indicated.  Follow up with the sleep clinic is not needed.

## 2020-05-28 DIAGNOSIS — Z23 Encounter for immunization: Secondary | ICD-10-CM | POA: Diagnosis not present

## 2020-06-11 DIAGNOSIS — Z961 Presence of intraocular lens: Secondary | ICD-10-CM | POA: Diagnosis not present

## 2020-06-11 DIAGNOSIS — H35033 Hypertensive retinopathy, bilateral: Secondary | ICD-10-CM | POA: Diagnosis not present

## 2020-06-11 DIAGNOSIS — E119 Type 2 diabetes mellitus without complications: Secondary | ICD-10-CM | POA: Diagnosis not present

## 2020-06-11 DIAGNOSIS — H35363 Drusen (degenerative) of macula, bilateral: Secondary | ICD-10-CM | POA: Diagnosis not present

## 2020-06-11 LAB — HM DIABETES EYE EXAM

## 2020-06-16 ENCOUNTER — Other Ambulatory Visit: Payer: Self-pay | Admitting: Osteopathic Medicine

## 2020-06-30 ENCOUNTER — Ambulatory Visit (INDEPENDENT_AMBULATORY_CARE_PROVIDER_SITE_OTHER): Payer: Medicare Other | Admitting: Osteopathic Medicine

## 2020-06-30 ENCOUNTER — Other Ambulatory Visit: Payer: Self-pay

## 2020-06-30 ENCOUNTER — Encounter: Payer: Self-pay | Admitting: Osteopathic Medicine

## 2020-06-30 VITALS — BP 122/74 | HR 101 | Temp 98.6°F | Wt 160.0 lb

## 2020-06-30 DIAGNOSIS — F413 Other mixed anxiety disorders: Secondary | ICD-10-CM

## 2020-06-30 DIAGNOSIS — E782 Mixed hyperlipidemia: Secondary | ICD-10-CM

## 2020-06-30 DIAGNOSIS — R7303 Prediabetes: Secondary | ICD-10-CM

## 2020-06-30 DIAGNOSIS — M8580 Other specified disorders of bone density and structure, unspecified site: Secondary | ICD-10-CM | POA: Diagnosis not present

## 2020-06-30 DIAGNOSIS — I10 Essential (primary) hypertension: Secondary | ICD-10-CM | POA: Diagnosis not present

## 2020-06-30 DIAGNOSIS — F5101 Primary insomnia: Secondary | ICD-10-CM | POA: Diagnosis not present

## 2020-06-30 DIAGNOSIS — Z1159 Encounter for screening for other viral diseases: Secondary | ICD-10-CM | POA: Diagnosis not present

## 2020-06-30 LAB — POCT GLYCOSYLATED HEMOGLOBIN (HGB A1C): Hemoglobin A1C: 6.1 % — AB (ref 4.0–5.6)

## 2020-06-30 MED ORDER — TRIAMTERENE-HCTZ 37.5-25 MG PO CAPS: ORAL_CAPSULE | ORAL | 3 refills | Status: DC

## 2020-06-30 NOTE — Progress Notes (Signed)
[name] is a 75 y.o. female who presents to  [place] [facility]  today, 06/30/20, seeking care for the following:  . Prediabetes: A1C improved from previous, down to 6.1 from 6.5 six mos ago. Doing well on the Ozempic   HTN: controlled   Obesity: doing well w/ weight loss, down to 160 lb from 181 about 6 mos ago   Hyposomnia/insomnia and anxiety: following w/ behavioral health. Underwent sleep study w/ neurology, very mild OSA, no CPAP required, follow-up w/ sleep clinic not needed.      ASSESSMENT & PLAN with other pertinent findings:  The primary encounter diagnosis was Prediabetes. Diagnoses of Essential hypertension, Osteopenia, unspecified location, Primary insomnia, Other mixed anxiety disorders, Mixed hyperlipidemia, and Encounter for hepatitis C screening test for low risk patient were also pertinent to this visit.   Chronic conditions stable: Prediabetes, HTN, insomnia, obesity. Pending labs for HLD.   There are no Patient Instructions on file for this visit.  Orders Placed This Encounter  Procedures  . CBC  . COMPLETE METABOLIC PANEL WITH GFR  . Lipid panel  . Hepatitis C antibody  . POCT HgB A1C    Meds ordered this encounter  Medications  . triamterene-hydrochlorothiazide (DYAZIDE) 37.5-25 MG capsule    Sig: TAKE ONE CAPSULE BY MOUTH ONE TIME A DAY    Dispense:  90 capsule    Refill:  3     See below for relevant physical exam findings  See below for recent lab and imaging results reviewed  Medications, allergies, PMH, PSH, SocH, [place] reviewed below    Follow-up instructions: Return in about 6 months (around 12/31/2020) for monitor blood pressure and insomnia, A1C for prediabetes .OK to refill all meds until that time.                                         Exam:  BP 122/74 (BP Location: Left Arm, Patient Position: Sitting, Cuff Size: Normal)   Pulse  (!) 101   Temp 98.6 F (37 C) (Oral)   Wt 160 lb 0.6 oz (72.6 kg)   BMI 29.27 kg/m   Constitutional: VS see above. General Appearance: alert, well-developed, well-nourished, NAD  Neck: No masses, trachea midline.   Respiratory: Normal respiratory effort. no wheeze, no rhonchi, no rales  Cardiovascular: S1/S2 normal, no murmur, no rub/gallop auscultated. RRR.   Musculoskeletal: Gait normal. Symmetric and independent movement of all extremities  Neurological: Normal balance/coordination. No tremor.  Skin: warm, dry, intact.   Psychiatric: Normal judgment/insight. Normal mood and affect. Oriented x3.   Current Meds  Medication Sig  . amLODipine-benazepril (LOTREL) 5-20 MG capsule Take 1 capsule by mouth daily.  Marland Kitchen atorvastatin (LIPITOR) 20 MG tablet Take 1 tablet (20 mg total) by mouth daily.  . clonazePAM (KLONOPIN) 0.5 MG tablet TAKE 1/2 TO 1 TABLET TWO TIMES A DAY AS NEEDED FOR ANXIETY **DO NOT TAKE WITH TEMAZEPAM OR AMBIEN**  . fluticasone (FLONASE) 50 MCG/ACT nasal spray Place into both nostrils daily.  . raloxifene (EVISTA) 60 MG tablet Take 1 tablet (60 mg total) by mouth daily.  . Semaglutide,0.25 or 0.5MG /DOS, (OZEMPIC, 0.25 OR 0.5 MG/DOSE,) 2 MG/1.5ML SOPN Inject 0.5 mg into the skin once a week.  . temazepam (RESTORIL) 30 MG capsule TAKE ONE CAPSULE BY MOUTH EVERY NIGHT AT BEDTIME AS NEEDED FOR SLEEP  . venlafaxine XR (  EFFEXOR-XR) 150 MG 24 hr capsule Take 1 capsule (150 mg total) by mouth daily with breakfast.  . [DISCONTINUED] triamterene-hydrochlorothiazide (DYAZIDE) 37.5-25 MG capsule TAKE ONE CAPSULE BY MOUTH ONE TIME A DAY    Allergies  Allergen Reactions  . Penicillins Hives    Tolerates Cephalosporins   . Sulfa Antibiotics Hives         Patient Active Problem List   Diagnosis Date Noted  . Obsessive-compulsive disorder 05/21/2020  . Trigger finger, left index finger 12/16/2019  . Moderate episode of recurrent major depressive disorder ([place]) 09/02/2019   . Generalized anxiety disorder 09/02/2019  . Anxiety disorder 08/16/2019  . Primary insomnia 08/16/2019  . Trigger finger, left ring finger 08/08/2019  . Post-menopausal 05/14/2017  . Prediabetes 01/12/2017  . Snoring 07/20/2016  . Obsessive-compulsive personality trait 12/07/2015  . Morbid obesity ([place]) 09/19/2015  . Primary localized osteoarthritis of right knee   . Cardiac risk counseling 08/16/2015  . Essential hypertension 08/04/2015  . Hyperlipidemia 08/04/2015  . Osteopenia 08/04/2015  . Anxiety associated with depression 08/04/2015  . Right knee pain 06/14/2015  . Hyposomnia, insomnia or sleeplessness associated with anxiety 06/14/2015    Family History  Problem Relation Age of Onset  . Heart failure Mother   . Heart attack Mother   . Alcohol abuse Maternal Aunt   . Heart attack Father   . Irregular heart beat Sister   . Hypertension Sister   . Heart disease Brother     Social History   Tobacco Use  Smoking Status Never Smoker  Smokeless Tobacco Never Used    Past Surgical History:  Procedure Laterality Date  . BREAST EXCISIONAL BIOPSY Right    benign cyst removal   . CARPAL TUNNEL RELEASE Right 2015   Ortho in [place]  . LASIK    . MENISCUS REPAIR Right 2011   Orthopedic in [place]  . TOTAL KNEE ARTHROPLASTY Right 09/20/2015  . TOTAL KNEE ARTHROPLASTY Right 09/20/2015   Procedure: TOTAL KNEE ARTHROPLASTY;  Surgeon: Elsie Saas, MD;  Location: [place];  Service: Orthopedics;  Laterality: Right;  . Trigger Thumb Right 2015   Done in [facility] at the same time as the carpal tunnel release  . TUBAL LIGATION  1980    Immunization History  Administered Date(s) Administered  . Fluad Quad(high Dose 65+) 10/30/2018  . Influenza, High Dose Seasonal PF 12/14/2016, 11/28/2017  . Influenza,inj,Quad PF,6+ Mos 12/07/2015  . Influenza-Unspecified 11/14/2019  . Moderna Sars-Covid-2 Vaccination 04/17/2019, 05/15/2019, 12/24/2019  . Pneumococcal Conjugate-13  07/01/2014  . Pneumococcal Polysaccharide-23 07/07/2010  . Tdap 08/18/2008, 12/05/2011  . Zoster 08/22/2010  . Zoster Recombinat (Shingrix) 09/22/2017, 03/11/2018    Recent Results (from the past 2160 hour(s))  POCT HgB A1C     Status: Abnormal   Collection Time: 06/30/20 10:59 AM  Result Value Ref Range   Hemoglobin A1C 6.1 (A) 4.0 - 5.6 %   HbA1c POC (<> result, manual entry)     HbA1c, POC (prediabetic range)     HbA1c, POC (controlled diabetic range)      No results found.     All questions at time of visit were answered - patient instructed to contact office with any additional concerns or updates. ER/RTC precautions were reviewed with the patient as applicable.   Please note: manual typing as well as voice recognition software may have been used to produce this document - typos may escape review. Please contact Dr. Sheppard Coil for any needed clarifications.

## 2020-07-01 LAB — LIPID PANEL
Cholesterol: 156 mg/dL (ref <200)
HDL: 58 mg/dL (ref >=50)
LDL Cholesterol (Calc): 77 mg/dL (calc)
Non-HDL Cholesterol (Calc): 98 mg/dL (calc) (ref <130)
Total CHOL/HDL Ratio: 2.7 (calc) (ref <5.0)
Triglycerides: 124 mg/dL (ref <150)

## 2020-07-01 LAB — COMPLETE METABOLIC PANEL WITH GFR
AG Ratio: 1.5 (calc) (ref 1.0–2.5)
ALT: 20 U/L (ref 6–29)
AST: 21 U/L (ref 10–35)
Albumin: 4.3 g/dL (ref 3.6–5.1)
Alkaline phosphatase (APISO): 87 U/L (ref 37–153)
BUN/Creatinine Ratio: 21 (calc) (ref 6–22)
BUN: 25 mg/dL (ref 7–25)
CO2: 24 mmol/L (ref 20–32)
Calcium: 9.9 mg/dL (ref 8.6–10.4)
Chloride: 102 mmol/L (ref 98–110)
Creat: 1.17 mg/dL — ABNORMAL HIGH (ref 0.60–0.93)
GFR, Est African American: 53 mL/min/{1.73_m2} — ABNORMAL LOW (ref >=60)
GFR, Est Non African American: 46 mL/min/{1.73_m2} — ABNORMAL LOW (ref >=60)
Globulin: 2.9 g/dL (calc) (ref 1.9–3.7)
Glucose, Bld: 103 mg/dL — ABNORMAL HIGH (ref 65–99)
Potassium: 4.3 mmol/L (ref 3.5–5.3)
Sodium: 140 mmol/L (ref 135–146)
Total Bilirubin: 0.3 mg/dL (ref 0.2–1.2)
Total Protein: 7.2 g/dL (ref 6.1–8.1)

## 2020-07-01 LAB — CBC
HCT: 36.4 % (ref 35.0–45.0)
Hemoglobin: 12 g/dL (ref 11.7–15.5)
MCH: 28.6 pg (ref 27.0–33.0)
MCHC: 33 g/dL (ref 32.0–36.0)
MCV: 86.7 fL (ref 80.0–100.0)
MPV: 9.6 fL (ref 7.5–12.5)
Platelets: 431 10*3/uL — ABNORMAL HIGH (ref 140–400)
RBC: 4.2 10*6/uL (ref 3.80–5.10)
RDW: 13.6 % (ref 11.0–15.0)
WBC: 8.9 10*3/uL (ref 3.8–10.8)

## 2020-07-01 LAB — HEPATITIS C ANTIBODY
Hepatitis C Ab: NONREACTIVE
SIGNAL TO CUT-OFF: 0.07 (ref <1.00)

## 2020-07-27 ENCOUNTER — Encounter: Payer: Self-pay | Admitting: Osteopathic Medicine

## 2020-07-27 ENCOUNTER — Other Ambulatory Visit: Payer: Self-pay

## 2020-07-27 ENCOUNTER — Telehealth (INDEPENDENT_AMBULATORY_CARE_PROVIDER_SITE_OTHER): Payer: Medicare Other | Admitting: Psychiatry

## 2020-07-27 ENCOUNTER — Encounter (HOSPITAL_COMMUNITY): Payer: Self-pay | Admitting: Psychiatry

## 2020-07-27 DIAGNOSIS — F418 Other specified anxiety disorders: Secondary | ICD-10-CM

## 2020-07-27 DIAGNOSIS — F5101 Primary insomnia: Secondary | ICD-10-CM

## 2020-07-27 DIAGNOSIS — F331 Major depressive disorder, recurrent, moderate: Secondary | ICD-10-CM

## 2020-07-27 MED ORDER — VENLAFAXINE HCL ER 150 MG PO CP24: 150.0000 mg | ORAL_CAPSULE | Freq: Every day | ORAL | 2 refills | Status: DC

## 2020-07-27 MED ORDER — TEMAZEPAM 30 MG PO CAPS: ORAL_CAPSULE | ORAL | 0 refills | Status: DC

## 2020-07-27 NOTE — Progress Notes (Signed)
[facility] MD/PA/NP OP Progress Note  Virtual Visit via Video Note  I connected with [name] on 57/[birth year]/69 at  2:00 PM EDT by a video enabled telemedicine application and verified that I am speaking with the correct person using two identifiers.  Location: Patient: Home Provider: Clinic   I discussed the limitations of evaluation and management by telemedicine and the availability of in person appointments. The patient expressed understanding and agreed to proceed.  I provided 30 minutes of non-face-to-face time during this encounter.    07/27/2020 6:29 PM [name]  MRN:  [id number]  Chief Complaint:  "I have been up and down"  HPI: 75 year old female seen today for follow up psychiatric evaluation.   She has a psychiatric history of insomnia, anxiety, depression, and passive SI.  She is currently being managed on, Effexor 150 mg daily, Klonopin 0.5 twice daily (perscribed by PCP), and Restoril 30 mg nightly. She notes her medications are effective in managing her psychiatric conditions.  Today she is pleasant, cooperative, engaged in conversation, and maintained eye contact.  She informed Probation officer that her sleep has been up and down.  She notes that some nights she sleeps 4 hours and other nights she sleeps 8.  Patient had a sleep study done.  Provider reviewed the documentation indicated that based off of the results of the home sleep test there was right at 8 hours of sleep time recorded.  It is noted that the patient had some fragmented sleep noted but was average and there was very mild apnea.  It was recommended that the patient utilize a mild device to help manage her sleep.  Insomnia was not noted at that time.  Patient notes that the test was only taking once and reports that on that night she slept well.  Patient notes that her anxiety and depression fluctuates pending on his sleep patterns.  Today she notes that she has minimal anxiety and depression.  Provider  conducted a GAD-7 and patient scored a 0, at her last visit she scored a 5.  Provider also conducted a PHQ-9 and patient scored a 12, at her last visit she scored 2.  Today she denies SI/HI/VAH, mania, or paranoia.  Provider informed patient that she is currently on Klonopin and Restoril which has a potential of causing respiratory distress.  Provider also informed patient that she wants Restoril to be used short term and reports that it may be reduced at her next visit.  She endorsed understanding and agreed.  Provider recommended the patient continue to follow-up with her PCP and request that patient have document shared with psychiatric clinic.  She endorsed understanding and agreed.  No medication changes made today. Patient agreeable to continue  other medications as prescribed.  No other concerns noted at this time.   Visit Diagnosis:    ICD-10-CM   1. Primary insomnia  F51.01 temazepam (RESTORIL) 30 MG capsule    2. Anxiety associated with depression  F41.8 venlafaxine XR (EFFEXOR-XR) 150 MG 24 hr capsule    3. Moderate episode of recurrent major depressive disorder (HCC)  F33.1 venlafaxine XR (EFFEXOR-XR) 150 MG 24 hr capsule      Past Psychiatric History:  insomnia, anxiety, depression, and passive SI.  Past Medical History:  Past Medical History:  Diagnosis Date   Anxiety    Depression    Headache    SINUS   Hyperlipidemia    Hypertension    Osteopenia    Primary localized osteoarthritis of right  knee     Past Surgical History:  Procedure Laterality Date   BREAST EXCISIONAL BIOPSY Right    benign cyst removal    CARPAL TUNNEL RELEASE Right 2015   Ortho in [place] Right 2011   Orthopedic in [place] Right 09/20/2015   TOTAL KNEE ARTHROPLASTY Right 09/20/2015   Procedure: TOTAL KNEE ARTHROPLASTY;  Surgeon: Elsie Saas, MD;  Location: [place];  Service: Orthopedics;  Laterality: Right;   Trigger Thumb Right 2015   Done  in [facility] at the same time as the carpal tunnel release   [place]    Family Psychiatric History: Mother anxiety, maternal aunts alcohol use, maternal aunt anorexia Family History:  Family History  Problem Relation Age of Onset   Heart failure Mother    Heart attack Mother    Alcohol abuse Maternal Aunt    Heart attack Father    Irregular heart beat Sister    Hypertension Sister    Heart disease Brother     Social History:  Social History   Socioeconomic History   Marital status: Married    Spouse name: Lawrence   Number of children: 1   Years of education: 17.5   Highest education level: Conservator, museum/gallery (e.g., MA, MS, MEng, MEd, MSW, MBA)  Occupational History   Occupation: retired    Comment: Licensed conveyancer  Tobacco Use   Smoking status: Never   Smokeless tobacco: Never  Vaping Use   Vaping Use: Never used  Substance and Sexual Activity   Alcohol use: No    Comment: 1 glass a month   Drug use: No   Sexual activity: Yes  Other Topics Concern   Not on file  Social History Narrative   Reads paper, walks, housework errands. Caffeine use daily   Social Determinants of Health   Financial Resource Strain: Low Risk    Difficulty of Paying Living Expenses: Not hard at all  Food Insecurity: No Food Insecurity   Worried About Charity fundraiser in the Last Year: Never true   Arboriculturist in the Last Year: Never true  Transportation Needs: No Transportation Needs   Lack of Transportation (Medical): No   Lack of Transportation (Non-Medical): No  Physical Activity: Inactive   Days of Exercise per Week: 0 days   Minutes of Exercise per Session: 0 min  Stress: No Stress Concern Present   Feeling of Stress : Not at all  Social Connections: Moderately Isolated   Frequency of Communication with Friends and Family: Three times a week   Frequency of Social Gatherings with Friends and Family: Once a week   Attends Religious Services: Never   Corporate treasurer or Organizations: No   Attends Archivist Meetings: Never   Marital Status: Married    Allergies:  Allergies  Allergen Reactions   Penicillins Hives    Tolerates Cephalosporins    Sulfa Antibiotics Hives         Metabolic Disorder Labs: Lab Results  Component Value Date   HGBA1C 6.1 (A) 06/30/2020   MPG 131 01/17/2019   MPG 128 11/28/2017   No results found for: PROLACTIN Lab Results  Component Value Date   CHOL 156 06/30/2020   TRIG 124 06/30/2020   HDL 58 06/30/2020   CHOLHDL 2.7 06/30/2020   LDLCALC 77 06/30/2020   [place] 75 06/30/2019   Lab Results  Component Value Date  TSH 4.01 01/08/2017   TSH 1.93 12/07/2015    Therapeutic Level Labs: No results found for: LITHIUM No results found for: VALPROATE No components found for:  CBMZ  Current Medications: Current Outpatient Medications  Medication Sig Dispense Refill   amLODipine-benazepril (LOTREL) 5-20 MG capsule Take 1 capsule by mouth daily. 90 capsule 3   atorvastatin (LIPITOR) 20 MG tablet Take 1 tablet (20 mg total) by mouth daily. 90 tablet 3   clonazePAM (KLONOPIN) 0.5 MG tablet TAKE 1/2 TO 1 TABLET TWO TIMES A DAY AS NEEDED FOR ANXIETY **DO NOT TAKE WITH TEMAZEPAM OR AMBIEN** 60 tablet 2   fluticasone (FLONASE) 50 MCG/ACT nasal spray Place into both nostrils daily.     raloxifene (EVISTA) 60 MG tablet Take 1 tablet (60 mg total) by mouth daily. 90 tablet 3   Semaglutide,0.25 or 0.5MG /DOS, (OZEMPIC, 0.25 OR 0.5 MG/DOSE,) 2 MG/1.5ML SOPN Inject 0.5 mg into the skin once a week. 7.5 mL 5   temazepam (RESTORIL) 30 MG capsule TAKE ONE CAPSULE BY MOUTH EVERY NIGHT AT BEDTIME AS NEEDED FOR SLEEP 30 capsule 0   triamterene-hydrochlorothiazide (DYAZIDE) 37.5-25 MG capsule TAKE ONE CAPSULE BY MOUTH ONE TIME A DAY 90 capsule 3   venlafaxine XR (EFFEXOR-XR) 150 MG 24 hr capsule Take 1 capsule (150 mg total) by mouth daily with breakfast. 90 capsule 2   No current facility-administered  medications for this visit.     Musculoskeletal: Strength & Muscle Tone:  Unable to assess due to telehealth visit [place]:  Unable to assess due to telehealth visit Patient leans: N/A  Psychiatric Specialty Exam: Review of Systems  There were no vitals taken for this visit.There is no height or weight on file to calculate BMI.  General Appearance: Well Groomed  Eye Contact:  Good  Speech:  Clear and Coherent and Normal Rate  Volume:  Normal  Mood:  Euthymic and inform her that she has increased anxiety and depression due to her sleep however notes that she is able to cope with  Affect:  Congruent  Thought Process:  Coherent, Goal Directed and Linear  Orientation:  Full (Time, Place, and Person)  Thought Content: WDL and Logical   Suicidal Thoughts:  No  Homicidal Thoughts:  No  Memory:  Immediate;   Good Recent;   Good Remote;   Good  Judgement:  Good  Insight:  Good  Psychomotor Activity:  Normal  Concentration:  Concentration: Good and Attention Span: Good  Recall:  Good  Fund of Knowledge: Good  Language: Good  Akathisia:  No  Handed:  Right  AIMS (if indicated): Not done  Assets:  Communication Skills Desire for Improvement Financial Resources/Insurance Housing Social Support  ADL's:  Intact  Cognition: WNL  Sleep:  Fair   Screenings: GAD-7    Flowsheet Row Video Visit from 07/27/2020 in [facility] Clinical Support from 04/29/2020 in [facility] Video Visit from 01/30/2020 in [facility] Office Visit from 09/17/2017 in [place] Visit from 02/15/2016 in [place]  Total GAD-7 Score 10 6 5 2 11       PHQ2-9    Flowsheet Row Video Visit from 07/27/2020 in [place] from 04/29/2020 in [facility] Office Visit from  02/27/2020 in [place] Video Visit from 01/30/2020 in [facility]  Visit from 12/01/2019 in [facility]  PHQ-2 Total Score 4 1 0 1 4  PHQ-9 Total Score 12 2 0 4 11      Flowsheet Row Clinical Support from 04/29/2020 in [place] No Risk        Assessment and Plan: Patient reports that her sleep continues to fluctuate. Provider informed patient that she is currently on Klonopin and Restoril which has a potential of causing respiratory distress.  Provider also informed patient that she wants Restoril to be used short term and reports that it may be reduced at her next visit.  She endorsed understanding and agreed.  Provider recommended the patient continue to follow-up with her PCP and request that patient have document shared with psychiatric clinic.  She endorsed understanding and agreed.  No medication changes made today. Patient agreeable to continue  other medications as prescribed.    1. Anxiety associated with depression Continue- venlafaxine XR (EFFEXOR-XR) 150 MG 24 hr capsule; Take 1 capsule (150 mg total) by mouth daily with breakfast.  Dispense: 90 capsule; Refill: 2  2. Moderate episode of recurrent major depressive disorder (HCC)  Continue- venlafaxine XR (EFFEXOR-XR) 150 MG 24 hr capsule; Take 1 capsule (150 mg total) by mouth daily with breakfast.  Dispense: 90 capsule; Refill: 2  3. Primary insomnia  Continue- temazepam (RESTORIL) 30MG  capsule; Take 1 capsule (30 mg total) by mouth at bedtime as needed for sleep.  Dispense: 15 capsule; Refill: 0    Follow-up in 3 months   Salley Slaughter, NP 07/27/2020, 4:40 PM

## 2020-07-27 NOTE — Progress Notes (Signed)
04

## 2020-08-11 ENCOUNTER — Other Ambulatory Visit: Payer: Self-pay | Admitting: Osteopathic Medicine

## 2020-08-11 DIAGNOSIS — Z1231 Encounter for screening mammogram for malignant neoplasm of breast: Secondary | ICD-10-CM

## 2020-08-14 ENCOUNTER — Encounter: Payer: Self-pay | Admitting: Osteopathic Medicine

## 2020-08-18 ENCOUNTER — Other Ambulatory Visit: Payer: Self-pay | Admitting: Osteopathic Medicine

## 2020-08-18 ENCOUNTER — Other Ambulatory Visit: Payer: Self-pay | Admitting: Family Medicine

## 2020-08-18 MED ORDER — CLONAZEPAM 0.5 MG PO TABS: ORAL_TABLET | ORAL | 2 refills | Status: DC

## 2020-08-18 MED ORDER — CLONAZEPAM 0.5 MG PO TABS: ORAL_TABLET | ORAL | 0 refills | Status: DC

## 2020-09-07 ENCOUNTER — Ambulatory Visit (INDEPENDENT_AMBULATORY_CARE_PROVIDER_SITE_OTHER): Payer: Medicare Other | Admitting: Sports Medicine

## 2020-09-07 ENCOUNTER — Ambulatory Visit (INDEPENDENT_AMBULATORY_CARE_PROVIDER_SITE_OTHER): Payer: Medicare Other

## 2020-09-07 ENCOUNTER — Other Ambulatory Visit: Payer: Self-pay

## 2020-09-07 DIAGNOSIS — M7541 Impingement syndrome of right shoulder: Secondary | ICD-10-CM | POA: Diagnosis not present

## 2020-09-07 DIAGNOSIS — M25511 Pain in right shoulder: Secondary | ICD-10-CM | POA: Diagnosis not present

## 2020-09-07 NOTE — Progress Notes (Signed)
    Procedures performed today:    Procedure: Real-time Ultrasound Guided injection of the right subacromial bursa Device: Samsung HS60  Verbal informed consent obtained.  Time-out conducted.  Noted no overlying erythema, induration, or other signs of local infection.  Skin prepped in a sterile fashion.  Local anesthesia: Topical Ethyl chloride.  With sterile technique and under real time ultrasound guidance: Noted intact cuff, 1 cc Kenalog 40, 1 cc lidocaine, 1 cc bupivacaine injected easily Completed without difficulty  Advised to call if fevers/chills, erythema, induration, drainage, or persistent bleeding.  Images permanently stored and available for review in PACS.  Impression: Technically successful ultrasound guided injection.  Independent interpretation of notes and tests performed by another provider:   None.  Brief History, Exam, Impression, and Recommendations:    Impingement syndrome, shoulder, right [name] is a very pleasant 75 year old female, she recalls a couple of weeks ago reaching back, feeling pain over her deltoid and subsequently having difficulty with abduction, with pain. On exam she has positive impingement signs, she recalls having a subacromial injection in the distant past on the left side that provided good relief. She has tried some Tylenol and other analgesics without any improvement, today we performed a subacromial injection, I would like some x-rays, I will set her up with home conditioning exercises, return to see me in 1 month, MRI for surgical planning if no better.    ___________________________________________ Gwen Her. Dianah Field, M.D., ABFM., CAQSM. Primary Care and [place] Instructor of [place] of [facility]

## 2020-09-07 NOTE — Assessment & Plan Note (Signed)
[name] is a very pleasant 75 year old female, she recalls a couple of weeks ago reaching back, feeling pain over her deltoid and subsequently having difficulty with abduction, with pain. On exam she has positive impingement signs, she recalls having a subacromial injection in the distant past on the left side that provided good relief. She has tried some Tylenol and other analgesics without any improvement, today we performed a subacromial injection, I would like some x-rays, I will set her up with home conditioning exercises, return to see me in 1 month, MRI for surgical planning if no better.

## 2020-09-28 ENCOUNTER — Encounter: Payer: Self-pay | Admitting: Osteopathic Medicine

## 2020-09-28 DIAGNOSIS — M8580 Other specified disorders of bone density and structure, unspecified site: Secondary | ICD-10-CM

## 2020-09-28 DIAGNOSIS — Z780 Asymptomatic menopausal state: Secondary | ICD-10-CM

## 2020-10-05 ENCOUNTER — Other Ambulatory Visit: Payer: Self-pay

## 2020-10-05 ENCOUNTER — Ambulatory Visit
Admission: RE | Admit: 2020-10-05 | Discharge: 2020-10-05 | Disposition: A | Discharge status: Home / Self Care | Payer: Medicare Other | Source: Ambulatory Visit | Attending: Osteopathic Medicine | Admitting: Osteopathic Medicine

## 2020-10-05 DIAGNOSIS — Z1231 Encounter for screening mammogram for malignant neoplasm of breast: Secondary | ICD-10-CM | POA: Diagnosis not present

## 2020-10-06 ENCOUNTER — Other Ambulatory Visit (HOSPITAL_COMMUNITY): Payer: Self-pay | Admitting: Psychiatry

## 2020-10-06 DIAGNOSIS — F5101 Primary insomnia: Secondary | ICD-10-CM

## 2020-10-07 ENCOUNTER — Other Ambulatory Visit: Payer: Self-pay

## 2020-10-07 ENCOUNTER — Ambulatory Visit (INDEPENDENT_AMBULATORY_CARE_PROVIDER_SITE_OTHER): Payer: Medicare Other | Admitting: Sports Medicine

## 2020-10-07 DIAGNOSIS — M7541 Impingement syndrome of right shoulder: Secondary | ICD-10-CM

## 2020-10-07 NOTE — Progress Notes (Signed)
    Procedures performed today:    None.  Independent interpretation of notes and tests performed by another provider:   None.  Brief History, Exam, Impression, and Recommendations:    Impingement syndrome, shoulder, right [name] is a pleasant 75 year old female, we treated her at the last visit for shoulder impingement syndrome, she got a subacromial injection and we gave her aggressive conditioning exercises, she returns today pain-free, I reiterated the importance of conditioning the rotator cuff to stabilize the shoulder joint, and increase the duration of her relief, she will do this for the rest of her life, return to see me as needed.    ___________________________________________ Gwen Her. Dianah Field, M.D., ABFM., CAQSM. Primary Care and [place] Instructor of [place] of [facility]

## 2020-10-07 NOTE — Assessment & Plan Note (Signed)
[name] is a pleasant 75 year old female, we treated her at the last visit for shoulder impingement syndrome, she got a subacromial injection and we gave her aggressive conditioning exercises, she returns today pain-free, I reiterated the importance of conditioning the rotator cuff to stabilize the shoulder joint, and increase the duration of her relief, she will do this for the rest of her life, return to see me as needed.

## 2020-10-08 DIAGNOSIS — Z23 Encounter for immunization: Secondary | ICD-10-CM | POA: Diagnosis not present

## 2020-10-27 ENCOUNTER — Encounter (HOSPITAL_COMMUNITY): Payer: Self-pay | Admitting: Psychiatry

## 2020-10-27 ENCOUNTER — Telehealth (INDEPENDENT_AMBULATORY_CARE_PROVIDER_SITE_OTHER): Payer: Medicare Other | Admitting: Psychiatry

## 2020-10-27 ENCOUNTER — Other Ambulatory Visit: Payer: Self-pay

## 2020-10-27 DIAGNOSIS — F331 Major depressive disorder, recurrent, moderate: Secondary | ICD-10-CM

## 2020-10-27 DIAGNOSIS — F5101 Primary insomnia: Secondary | ICD-10-CM | POA: Diagnosis not present

## 2020-10-27 DIAGNOSIS — F418 Other specified anxiety disorders: Secondary | ICD-10-CM | POA: Diagnosis not present

## 2020-10-27 MED ORDER — TEMAZEPAM 15 MG PO CAPS: ORAL_CAPSULE | ORAL | 0 refills | Status: DC

## 2020-10-27 MED ORDER — VENLAFAXINE HCL ER 150 MG PO CP24: 150.0000 mg | ORAL_CAPSULE | Freq: Every day | ORAL | 3 refills | Status: DC

## 2020-10-27 MED ORDER — DOXEPIN HCL 25 MG PO CAPS
50.0000 mg | ORAL_CAPSULE | Freq: Every day | ORAL | 3 refills | Status: DC
Start: 2020-10-27 — End: 2020-11-02

## 2020-10-27 NOTE — Progress Notes (Signed)
[facility] MD/PA/NP OP Progress Note  Virtual Visit via Video Note  I connected with [name] on [id number] at  1:00 PM EDT by a video enabled telemedicine application and verified that I am speaking with the correct person using two identifiers.  Location: Patient: Home Provider: Clinic   I discussed the limitations of evaluation and management by telemedicine and the availability of in person appointments. The patient expressed understanding and agreed to proceed.  I provided 30 minutes of non-face-to-face time during this encounter.    10/27/2020 0000000 PM [name]  MRN:  [id number]  Chief Complaint:  "I have been up and down with sleep"  HPI: 75 year old female seen today for follow up psychiatric evaluation.   She has a psychiatric history of insomnia, anxiety, depression, and passive SI.  She is currently being managed on, Effexor 150 mg daily, Klonopin 0.5 twice daily (perscribed by PCP), and Restoril 30 mg nightly. She notes her medications are somewhat effective in managing her psychiatric conditions.  She reports her symptoms have been well controlled on these medications but has still been experiencing some insomnia.   Today she appears for a telehealth visit. She is pleasant, cooperative, and engaged in conversation. She says she still has not been sleeping well, especially the last few days. She has some nights that are good, and others are not. She is still experiencing waking up after four hours of sleep to go to the bathroom and she has not been able to get back to sleep after.  Plan to discuss patient being seen by neurology. She notes that she feels like at her age going to the bathroom in the middle of the night is normal but is willing to go see a urologist.  She asked provider what she thought about hypnosis and CBD products.  Provider informed patient that CBD is not recommended at this time.  Provider also informed patient that she was not certain of the  effects of hypnosis for insomnia.  Patient notes when she does not sleep, she feels like she becomes frantic and worries about it too much. She also worries about getting things done throughout the day and being organized. She has recently had a sleep study which demonstrated mild sleep apnea that did not qualify for the use of a CPAP.  She says otherwise she has been doing well. She has lost 50lbs due to a decrease in appetite after starting Ozempic and discontinuing mirtazapine. She hasn't been feeling down or depressed unless she does not have a good night.   Provider conducted a GAD-7 with a score of 9, at the last visit she scored a 0. Provider also conducted a PHQ-9 with a score of 7, at the last visit she scored a 12. Today she endorses no physical pain. Today she denies SI, HI, AVH, mania, and paranoia.   Today patient is aggregable to decreasing dose of Restoril to '15mg'$ .  She will start  Doxepin 25 mg nightly to aid in her insomnia, anxiety, and depression. Potential side effects of medication and risks vs benefits of treatment vs non-treatment were explained and discussed. All questions were answered.  No other concerns noted at this time.    Visit Diagnosis:    ICD-10-CM   1. Primary insomnia  F51.01 temazepam (RESTORIL) 15 MG capsule    2. Anxiety associated with depression  F41.8 venlafaxine XR (EFFEXOR-XR) 150 MG 24 hr capsule    3. Moderate episode of recurrent major depressive disorder (HCC)  F33.1 venlafaxine XR (EFFEXOR-XR) 150 MG 24 hr capsule      Past Psychiatric History:  insomnia, anxiety, depression, and passive SI.  Past Medical History:  Past Medical History:  Diagnosis Date   Anxiety    Depression    Headache    SINUS   Hyperlipidemia    Hypertension    Osteopenia    Primary localized osteoarthritis of right knee     Past Surgical History:  Procedure Laterality Date   BREAST EXCISIONAL BIOPSY Right    benign cyst removal    CARPAL TUNNEL RELEASE Right  2015   Ortho in [place] Right 2011   Orthopedic in [place] Right 09/20/2015   TOTAL KNEE ARTHROPLASTY Right 09/20/2015   Procedure: TOTAL KNEE ARTHROPLASTY;  Surgeon: Elsie Saas, MD;  Location: [place] [facility];  Service: Orthopedics;  Laterality: Right;   Trigger Thumb Right 2015   Done in [facility] at the same time as the carpal tunnel release   [place]    Family Psychiatric History: Mother anxiety, maternal aunts alcohol use, maternal aunt anorexia Family History:  Family History  Problem Relation Age of Onset   Heart failure Mother    Heart attack Mother    Alcohol abuse Maternal Aunt    Heart attack Father    Irregular heart beat Sister    Hypertension Sister    Heart disease Brother     Social History:  Social History   Socioeconomic History   Marital status: Married    Spouse name: Lawrence   Number of children: 1   Years of education: 17.5   Highest education level: Conservator, museum/gallery (e.g., MA, MS, MEng, MEd, MSW, MBA)  Occupational History   Occupation: retired    Comment: Licensed conveyancer  Tobacco Use   Smoking status: Never   Smokeless tobacco: Never  Vaping Use   Vaping Use: Never used  Substance and Sexual Activity   Alcohol use: No    Comment: 1 glass a month   Drug use: No   Sexual activity: Yes  Other Topics Concern   Not on file  Social History Narrative   Reads paper, walks, housework errands. Caffeine use daily   Social Determinants of Health   Financial Resource Strain: Low Risk    Difficulty of Paying Living Expenses: Not hard at all  Food Insecurity: No Food Insecurity   Worried About Charity fundraiser in the Last Year: Never true   Arboriculturist in the Last Year: Never true  Transportation Needs: No Transportation Needs   Lack of Transportation (Medical): No   Lack of Transportation (Non-Medical): No  Physical Activity: Inactive   Days of Exercise per Week: 0 days   Minutes of  Exercise per Session: 0 min  Stress: No Stress Concern Present   Feeling of Stress : Not at all  Social Connections: Moderately Isolated   Frequency of Communication with Friends and Family: Three times a week   Frequency of Social Gatherings with Friends and Family: Once a week   Attends Religious Services: Never   Marine scientist or Organizations: No   Attends Archivist Meetings: Never   Marital Status: Married    Allergies:  Allergies  Allergen Reactions   Penicillins Hives    Tolerates Cephalosporins    Sulfa Antibiotics Hives         Metabolic Disorder Labs: Lab Results  Component  Value Date   HGBA1C 6.1 (A) 06/30/2020   MPG 131 01/17/2019   MPG 128 11/28/2017   No results found for: PROLACTIN Lab Results  Component Value Date   CHOL 156 06/30/2020   TRIG 124 06/30/2020   HDL 58 06/30/2020   CHOLHDL 2.7 06/30/2020   LDLCALC 77 06/30/2020   LDLCALC 75 06/30/2019   Lab Results  Component Value Date   TSH 4.01 01/08/2017   TSH 1.93 12/07/2015    Therapeutic Level Labs: No results found for: LITHIUM No results found for: VALPROATE No components found for:  CBMZ  Current Medications: Current Outpatient Medications  Medication Sig Dispense Refill   doxepin (SINEQUAN) 25 MG capsule Take 2 capsules (50 mg total) by mouth at bedtime. 30 capsule 3   amLODipine-benazepril (LOTREL) 5-20 MG capsule Take 1 capsule by mouth daily. 90 capsule 3   atorvastatin (LIPITOR) 20 MG tablet Take 1 tablet (20 mg total) by mouth daily. 90 tablet 3   clonazePAM (KLONOPIN) 0.5 MG tablet TAKE 1/2 TO 1 TABLET TWO TIMES A DAY AS NEEDED FOR ANXIETY **DO NOT TAKE WITH TEMAZEPAM OR AMBIEN** 60 tablet 0   fluticasone (FLONASE) 50 MCG/ACT nasal spray Place into both nostrils daily.     raloxifene (EVISTA) 60 MG tablet Take 1 tablet (60 mg total) by mouth daily. 90 tablet 3   Semaglutide,0.25 or 0.'5MG'$ /DOS, (OZEMPIC, 0.25 OR 0.5 MG/DOSE,) 2 MG/1.5ML SOPN Inject 0.5 mg into  the skin once a week. 7.5 mL 5   temazepam (RESTORIL) 15 MG capsule Take one tablet nightly as needed. 30 capsule 0   triamterene-hydrochlorothiazide (DYAZIDE) 37.5-25 MG capsule TAKE ONE CAPSULE BY MOUTH ONE TIME A DAY 90 capsule 3   venlafaxine XR (EFFEXOR-XR) 150 MG 24 hr capsule Take 1 capsule (150 mg total) by mouth daily with breakfast. 90 capsule 3   No current facility-administered medications for this visit.     Musculoskeletal: Strength & Muscle Tone:  Unable to assess due to telehealth visit [facility]:  Unable to assess due to telehealth visit Patient leans: N/A  Psychiatric Specialty Exam: Review of Systems  There were no vitals taken for this visit.There is no height or weight on file to calculate BMI.  General Appearance: Well Groomed  Eye Contact:  Good  Speech:  Clear and Coherent and Normal Rate  Volume:  Normal  Mood:  Euthymic  Affect:  Congruent  Thought Process:  Coherent, Goal Directed and Linear  Orientation:  Full (Time, Place, and Person)  Thought Content: WDL and Logical   Suicidal Thoughts:  No  Homicidal Thoughts:  No  Memory:  Immediate;   Good Recent;   Good Remote;   Good  Judgement:  Good  Insight:  Good  Psychomotor Activity:  Normal  Concentration:  Concentration: Good and Attention Span: Good  Recall:  Good  Fund of Knowledge: Good  Language: Good  Akathisia:  No  Handed:  Right  AIMS (if indicated): Not done  Assets:  Communication Skills Desire for Improvement Financial Resources/Insurance Housing Social Support  ADL's:  Intact  Cognition: WNL  Sleep:  Fair   Screenings: GAD-7    Flowsheet Row Video Visit from 10/27/2020 in [facility] Video Visit from 07/27/2020 in [place] from 04/29/2020 in [facility] Video Visit from 01/30/2020 in [facility] Office Visit from 09/17/2017 in [place]  Total GAD-7 Score  $'9 10 6 5 2      'z$ PHQ2-9    Flowsheet Row Video Visit from 10/27/2020 in [facility] Video Visit from 07/27/2020 in [place] from 04/29/2020 in [facility] Office Visit from 02/27/2020 in [place] Video Visit from 01/30/2020 in [facility]  PHQ-2 Total Score '2 4 1 '$ 0 1  PHQ-9 Total Score '7 12 2 '$ 0 4      Flowsheet Row Clinical Support from 04/29/2020 in [place] No Risk        Assessment and Plan: Patient reports that her sleep continues to fluctuate.  Patient continues to take Klonopin and Restoril.  Provider informed patient that she was concerned about respiratory distress.  Today Restoril reduced to 15 mg.  Doxepin 25 mg started to help manage sleep and anxiety.  Patient notes that she believes her fluctuation sleep is due to her increased nocturia.  Provider discussed patient following up with a urologist.  She endorsed understanding and agreed.  She will continue all other medications as prescribed.     1. Primary insomnia  Reduce- temazepam (RESTORIL) 15 MG capsule; Take one tablet nightly as needed.  Dispense: 30 capsule; Refill: 0  2. Anxiety associated with depression  Start- doxepin (SINEQUAN) 25 MG capsule; Take 2 capsules (50 mg total) by mouth at bedtime.  Dispense: 30 capsule; Refill: 3 -Continue venlafaxine XR (EFFEXOR-XR) 150 MG 24 hr capsule; Take 1 capsule (150 mg total) by mouth daily with breakfast.  Dispense: 90 capsule; Refill: 3  3. Moderate episode of recurrent major depressive disorder (HCC)  Start- doxepin (SINEQUAN) 25 MG capsule; Take 2 capsules (50 mg total) by mouth at bedtime.  Dispense: 30 capsule; Refill: 3 Continue- venlafaxine XR (EFFEXOR-XR) 150 MG 24 hr capsule; Take 1  capsule (150 mg total) by mouth daily with breakfast.  Dispense: 90 capsule; Refill: 3    Follow-up in 3 months   Salley Slaughter, NP 10/27/2020, 1:25 PM

## 2020-10-29 ENCOUNTER — Telehealth (HOSPITAL_COMMUNITY): Payer: Self-pay | Admitting: *Deleted

## 2020-10-29 NOTE — Telephone Encounter (Signed)
Call from [facility] after patient called them re her Doxepin rx. Sig on rx says 50 mg but she remembers Dr Ronne Binning saying take '25mg'$ . I called Naiyah to clarify with her and to find out what she has been taking. She has taken 50 mg for two nights and feels "walloped" Writer told her to back down to 25 mg, will notify provider and call her next week to check on her.

## 2020-11-02 ENCOUNTER — Telehealth (INDEPENDENT_AMBULATORY_CARE_PROVIDER_SITE_OTHER): Payer: Medicare Other | Admitting: Osteopathic Medicine

## 2020-11-02 ENCOUNTER — Encounter: Payer: Self-pay | Admitting: Osteopathic Medicine

## 2020-11-02 ENCOUNTER — Other Ambulatory Visit (HOSPITAL_COMMUNITY): Payer: Self-pay | Admitting: Psychiatry

## 2020-11-02 VITALS — Temp 98.4°F

## 2020-11-02 DIAGNOSIS — J029 Acute pharyngitis, unspecified: Secondary | ICD-10-CM | POA: Diagnosis not present

## 2020-11-02 DIAGNOSIS — F418 Other specified anxiety disorders: Secondary | ICD-10-CM

## 2020-11-02 DIAGNOSIS — F331 Major depressive disorder, recurrent, moderate: Secondary | ICD-10-CM

## 2020-11-02 MED ORDER — BENZONATATE 200 MG PO CAPS: 200.0000 mg | ORAL_CAPSULE | Freq: Three times a day (TID) | ORAL | 0 refills | Status: DC | PRN

## 2020-11-02 MED ORDER — DOXEPIN HCL 25 MG PO CAPS: 25.0000 mg | ORAL_CAPSULE | Freq: Every day | ORAL | 3 refills | Status: DC

## 2020-11-02 MED ORDER — LIDOCAINE VISCOUS HCL 2 % MT SOLN: 10.0000 mL | OROMUCOSAL | 1 refills | Status: DC | PRN

## 2020-11-02 NOTE — Patient Instructions (Signed)
Medications & Home Remedies for Upper Respiratory Illness   Note: the following list assumes no pregnancy, normal liver & kidney function and no other drug interactions. Dr. Sheppard Coil has highlighted medications which are safe for you to use, but these may not be appropriate for everyone. Always ask a pharmacist or qualified medical provider if you have any questions!    Aches/Pains, Fever, Headache OTC Acetaminophen (Tylenol) 500 mg tablets - take max 2 tablets (1000 mg) every 6 hours (4 times per day)  OTC Ibuprofen (Motrin) 200 mg tablets - take max 4 tablets (800 mg) every 6 hours*   Sinus Congestion OTC Nasal Saline if desired to rinse OTC Oxymetolazone (Afrin, others) sparing use due to rebound congestion, NEVER use in kids OTC Phenylephrine (Sudafed) 10 mg tablets every 4 hours (or the 12-hour formulation)* OTC Diphenhydramine (Benadryl) 25 mg tablets - take max 2 tablets every 4 hours   Cough & Sore Throat Prescription cough pills or syrups as directed Prescription Lidocaine to numb sore throat OTC Dextromethorphan (Robitussin, others) - cough suppressant OTC Guaifenesin (Robitussin, Mucinex, others) - expectorant (helps cough up mucus) (Dextromethorphan and Guaifenesin also come in a combination tablet/syrup) OTC Lozenges w/ Benzocaine + Menthol (Cepacol) Honey - as much as you want! Teas which "coat the throat" - look for ingredients Elm Bark, Licorice Root, Marshmallow Root   Other Prescription Oral Steroids to decrease inflammation and improve energy Prescription Antibiotics if these are necessary for bacterial infection - take ALL, even if you're feeling better  OTC Zinc Lozenges within 24 hours of symptoms onset - mixed evidence this shortens the duration of the common cold Don't waste your money on Vitamin C or Echinacea in acute illness - it's already too late!    *Caution in patients with high blood pressure

## 2020-11-02 NOTE — Telephone Encounter (Signed)
Provider attempted to call patient without success.  Patient is to take 25 mg nightly to help manage sleep, anxiety, and depression.  Provided left a message detailing this for patient and informed her that she can call back if she has other questions or concerns.

## 2020-11-02 NOTE — Progress Notes (Signed)
Telemedicine Visit via  Video & Audio (App used: MYCHSRT)   I connected with [name] on 71/[birth year] at 3:23 PM  by phone or  telemedicine application as noted above  I verified that I am speaking with or regarding  the correct patient using two identifiers.  Participants: Myself, Dr Emeterio Reeve DO Patient: [name] Patient proxy if applicable: none Other, if applicable: none  Patient is at home I am in office at [facility]    I discussed the limitations of evaluation and management  by telemedicine and the availability of in person appointments.  The participant(s) above expressed understanding and  agreed to proceed with this appointment via telemedicine.       History of Present Illness: [name] is a 75 y.o. female who would like to discuss sore throat x1 day, feels like swallowed a pill and it is stuck, feeling chills, no fever, history bronchitis so is concerned. Has not taken OTC meds.        Observations/Objective: Temp 98.4 F (36.9 C) Comment: MyChart visit::patient reported BP Readings from Last 3 Encounters:  06/30/20 122/74  04/14/20 117/67  01/01/20 133/75   Exam: Normal Speech.  NAD  Lab and Radiology Results No results found for this or any previous visit (from the past 72 hour(s)). No results found.     Assessment and Plan: 75 y.o. female with The encounter diagnosis was Viral pharyngitis.   PDMP not reviewed this encounter. No orders of the defined types were placed in this encounter.  Meds ordered this encounter  Medications   lidocaine (XYLOCAINE) 2 % solution    Sig: Use as directed 10 mLs in the mouth or throat every 3 (three) hours as needed (mouth/throat pain).    Dispense:  100 mL    Refill:  1   benzonatate (TESSALON) 200 MG capsule    Sig: Take 1 capsule (200 mg total) by mouth 3 (three) times daily as needed for cough.    Dispense:  30 capsule    Refill:  0     Patient Instructions  Medications & Home Remedies for Upper Respiratory Illness   Note: the following list assumes no pregnancy, normal liver & kidney function and no other drug interactions. Dr. Sheppard Coil has highlighted medications which are safe for you to use, but these may not be appropriate for everyone. Always ask a pharmacist or qualified medical provider if you have any questions!    Aches/Pains, Fever, Headache OTC Acetaminophen (Tylenol) 500 mg tablets - take max 2 tablets (1000 mg) every 6 hours (4 times per day)  OTC Ibuprofen (Motrin) 200 mg tablets - take max 4 tablets (800 mg) every 6 hours*   Sinus Congestion OTC Nasal Saline if desired to rinse OTC Oxymetolazone (Afrin, others) sparing use due to rebound congestion, NEVER use in kids OTC Phenylephrine (Sudafed) 10 mg tablets every 4 hours (or the 12-hour formulation)* OTC Diphenhydramine (Benadryl) 25 mg tablets - take max 2 tablets every 4 hours   Cough & Sore Throat Prescription cough pills or syrups as directed Prescription Lidocaine to numb sore throat OTC Dextromethorphan (Robitussin, others) - cough suppressant OTC Guaifenesin (Robitussin, Mucinex, others) - expectorant (helps cough up mucus) (Dextromethorphan and Guaifenesin also come in a combination tablet/syrup) OTC Lozenges w/ Benzocaine + Menthol (Cepacol) Honey - as much as you want! Teas which "coat the throat" - look for ingredients Elm Bark, Licorice Root, Marshmallow Root   Other Prescription Oral Steroids to decrease inflammation  and improve energy Prescription Antibiotics if these are necessary for bacterial infection - take ALL, even if you're feeling better  OTC Zinc Lozenges within 24 hours of symptoms onset - mixed evidence this shortens the duration of the common cold Don't waste your money on Vitamin C or Echinacea in acute illness - it's already too late!    *Caution in patients with high blood pressure    Instructions sent via  MyChart.   Follow Up Instructions: Return if symptoms worsen or fail to improve.    I discussed the assessment and treatment plan with the patient. The patient was provided an opportunity to ask questions and all were answered. The patient agreed with the plan and demonstrated an understanding of the instructions.   The patient was advised to call back or seek an in-person evaluation if any new concerns, if symptoms worsen or if the condition fails to improve as anticipated.  20 minutes of non-face-to-face time was provided during this encounter.      . . . . . . . . . . . . . Marland Kitchen                   Historical information moved to improve visibility of documentation.  Past Medical History:  Diagnosis Date   Anxiety    Depression    Headache    SINUS   Hyperlipidemia    Hypertension    Osteopenia    Primary localized osteoarthritis of right knee    Past Surgical History:  Procedure Laterality Date   BREAST EXCISIONAL BIOPSY Right    benign cyst removal    CARPAL TUNNEL RELEASE Right 2015   Ortho in [place] Right 2011   Orthopedic in [place] Right 09/20/2015   TOTAL KNEE ARTHROPLASTY Right 09/20/2015   Procedure: TOTAL KNEE ARTHROPLASTY;  Surgeon: Elsie Saas, MD;  Location: [facility];  Service: Orthopedics;  Laterality: Right;   Trigger Thumb Right 2015   Done in [place] at the same time as the carpal tunnel release   TUBAL LIGATION  1980   Social History   Tobacco Use   Smoking status: Never   Smokeless tobacco: Never  Substance Use Topics   Alcohol use: No    Comment: 1 glass a month   family history includes Alcohol abuse in her maternal aunt; Heart attack in her father and mother; Heart disease in her brother; Heart failure in her mother; Hypertension in her sister; Irregular heart beat in her sister.  Medications: Current Outpatient Medications  Medication Sig Dispense Refill    amLODipine-benazepril (LOTREL) 5-20 MG capsule Take 1 capsule by mouth daily. 90 capsule 3   atorvastatin (LIPITOR) 20 MG tablet Take 1 tablet (20 mg total) by mouth daily. 90 tablet 3   benzonatate (TESSALON) 200 MG capsule Take 1 capsule (200 mg total) by mouth 3 (three) times daily as needed for cough. 30 capsule 0   clonazePAM (KLONOPIN) 0.5 MG tablet TAKE 1/2 TO 1 TABLET TWO TIMES A DAY AS NEEDED FOR ANXIETY **DO NOT TAKE WITH TEMAZEPAM OR AMBIEN** 60 tablet 0   doxepin (SINEQUAN) 25 MG capsule Take 1 capsule (25 mg total) by mouth at bedtime. 30 capsule 3   fluticasone (FLONASE) 50 MCG/ACT nasal spray Place into both nostrils daily.     lidocaine (XYLOCAINE) 2 % solution Use as directed 10 mLs in the mouth or throat every 3 (three) hours as  needed (mouth/throat pain). 100 mL 1   raloxifene (EVISTA) 60 MG tablet Take 1 tablet (60 mg total) by mouth daily. 90 tablet 3   Semaglutide,0.25 or 0.5MG /DOS, (OZEMPIC, 0.25 OR 0.5 MG/DOSE,) 2 MG/1.5ML SOPN Inject 0.5 mg into the skin once a week. 7.5 mL 5   temazepam (RESTORIL) 15 MG capsule Take one tablet nightly as needed. 30 capsule 0   triamterene-hydrochlorothiazide (DYAZIDE) 37.5-25 MG capsule TAKE ONE CAPSULE BY MOUTH ONE TIME A DAY 90 capsule 3   venlafaxine XR (EFFEXOR-XR) 150 MG 24 hr capsule Take 1 capsule (150 mg total) by mouth daily with breakfast. 90 capsule 3   No current facility-administered medications for this visit.   Allergies  Allergen Reactions   Penicillins Hives    Tolerates Cephalosporins    Sulfa Antibiotics Hives          If phone visit, billing and coding can please add appropriate modifier if needed

## 2020-11-03 ENCOUNTER — Ambulatory Visit: Payer: Self-pay

## 2020-11-24 ENCOUNTER — Other Ambulatory Visit (HOSPITAL_COMMUNITY): Payer: Self-pay | Admitting: Psychiatry

## 2020-11-24 DIAGNOSIS — F5101 Primary insomnia: Secondary | ICD-10-CM

## 2020-11-26 ENCOUNTER — Ambulatory Visit: Payer: Medicare Other | Admitting: Sports Medicine

## 2020-12-13 ENCOUNTER — Ambulatory Visit: Payer: Medicare Other | Admitting: Sports Medicine

## 2020-12-21 ENCOUNTER — Ambulatory Visit (INDEPENDENT_AMBULATORY_CARE_PROVIDER_SITE_OTHER): Payer: Medicare Other

## 2020-12-21 ENCOUNTER — Ambulatory Visit (INDEPENDENT_AMBULATORY_CARE_PROVIDER_SITE_OTHER): Payer: Medicare Other | Admitting: Sports Medicine

## 2020-12-21 ENCOUNTER — Other Ambulatory Visit: Payer: Self-pay

## 2020-12-21 DIAGNOSIS — M7541 Impingement syndrome of right shoulder: Secondary | ICD-10-CM

## 2020-12-21 NOTE — Progress Notes (Signed)
    Procedures performed today:    Procedure: Real-time Ultrasound Guided injection of the right subacromial bursa Device: Samsung [id number]  Verbal informed consent obtained.  Time-out conducted.  Noted no overlying erythema, induration, or other signs of local infection.  Skin prepped in a sterile fashion.  Local anesthesia: Topical Ethyl chloride.  With sterile technique and under real time ultrasound guidance: Noted intact cuff, 1 cc Kenalog 40, 1 cc lidocaine, 1 cc bupivacaine injected easily Completed without difficulty  Advised to call if fevers/chills, erythema, induration, drainage, or persistent bleeding.  Images permanently stored and available for review in PACS.  Impression: Technically successful ultrasound guided injection.  Independent interpretation of notes and tests performed by another provider:   None.  Brief History, Exam, Impression, and Recommendations:    Impingement syndrome, shoulder, right This is a very pleasant 75 year old female, she did really well after a subacromial injection in July and conditioning exercises, has slacked off with her conditioning and is having some increasing pain in the right shoulder, impingement in nature, she does desire repeat subacromial injection today, this was performed, she can return as needed.  Chronic process with exacerbation and pharmacologic intervention.    ___________________________________________ Gwen Her. Dianah Field, M.D., ABFM., CAQSM. Primary Care and [place] Instructor of [place] [facility]

## 2020-12-21 NOTE — Assessment & Plan Note (Signed)
This is a very pleasant 75 year old female, she did really well after a subacromial injection in July and conditioning exercises, has slacked off with her conditioning and is having some increasing pain in the right shoulder, impingement in nature, she does desire repeat subacromial injection today, this was performed, she can return as needed.  Chronic process with exacerbation and pharmacologic intervention.

## 2020-12-31 ENCOUNTER — Other Ambulatory Visit: Payer: Self-pay

## 2020-12-31 ENCOUNTER — Encounter: Payer: Self-pay | Admitting: Medical-Surgical

## 2020-12-31 ENCOUNTER — Ambulatory Visit (INDEPENDENT_AMBULATORY_CARE_PROVIDER_SITE_OTHER): Payer: Medicare Other | Admitting: Medical-Surgical

## 2020-12-31 VITALS — BP 117/65 | HR 69 | Resp 20 | Ht 62.0 in | Wt 153.5 lb

## 2020-12-31 DIAGNOSIS — R7303 Prediabetes: Secondary | ICD-10-CM | POA: Diagnosis not present

## 2020-12-31 DIAGNOSIS — F5101 Primary insomnia: Secondary | ICD-10-CM | POA: Diagnosis not present

## 2020-12-31 DIAGNOSIS — Z7689 Persons encountering health services in other specified circumstances: Secondary | ICD-10-CM | POA: Diagnosis not present

## 2020-12-31 DIAGNOSIS — I10 Essential (primary) hypertension: Secondary | ICD-10-CM

## 2020-12-31 LAB — POCT GLYCOSYLATED HEMOGLOBIN (HGB A1C): HbA1c POC (<> result, manual entry): 5.7 % (ref 4.0–5.6)

## 2020-12-31 NOTE — Progress Notes (Signed)
  HPI with pertinent ROS:   CC: Transfer of care, chronic disease follow-up  HPI: Pleasant 75 year old female presenting today to transfer care to a new PCP and for the following:  Hypertension-taking amlodipine-benazepril 5-20 mg and triamterene-hydrochlorothiazide 37.5-25 mg daily as prescribed, tolerating well without side effects. Denies CP, SOB, palpitations, lower extremity edema, dizziness, headaches, or vision changes.  Insomnia-followed by psychiatry.  They have her taking temazepam at bedtime which works fairly well for the most part.  She is okay for now and not having any significant issues but she does have flares of insomnia where she cannot sleep for several days which is very difficult.  She is due to follow-up with psychiatry every 3 months.  Prediabetes-previous hemoglobin A1c 6.1%.  She has been using semaglutide 0.5 mg weekly, tolerating well although she is having some issues with constipation.  She feels these constipation issues are fairly manageable but wonders if she needs to continue on the Ozempic at this point.  Reports having difficulty getting her medication from the pharmacy due to back orders and will be due for her next injection on Sunday.  I reviewed the past medical history, family history, social history, surgical history, and allergies today and no changes were needed.  Please see the problem list section below in epic for further details.   Physical exam:   General: Well Developed, well nourished, and in no acute distress.  Neuro: Alert and oriented x3.  HEENT: Normocephalic, atraumatic.  Skin: Warm and dry. Cardiac: Regular rate and rhythm, no murmurs rubs or gallops, no lower extremity edema.  Respiratory: Clear to auscultation bilaterally. Not using accessory muscles, speaking in full sentences.  Impression and Recommendations:    1. Encounter to establish care Reviewed available information and discussed care concerns with patient.   2.  Prediabetes POCT hemoglobin A1c 5.7% today which is great.  Since she is having significant constipation with the Ozempic, consider dropping to 0.25 mg weekly or even dropping the dose to 0.25 mg every other week alternating with 0.5 mg.  Increase dietary fiber and water consumption to manage constipation.  Continue to monitor dietary intake of simple carbohydrates and concentrated sweets.  3. Essential hypertension Blood pressure at goal today 117/65.  Continue current medications as prescribed.  Checking CBC and CMP today.  4. Primary insomnia Managed by psychiatry.  Stable on current regimen for now.  Return in about 6 months (around 06/30/2021) for prediabetes/HTN follow up. ___________________________________________ Clearnce Sorrel, DNP, APRN, FNP-BC Primary Care and [place]

## 2021-01-01 LAB — CBC WITH DIFFERENTIAL/PLATELET
Absolute Monocytes: 545 cells/uL (ref 200–950)
Basophils Absolute: 71 cells/uL (ref 0–200)
Basophils Relative: 0.7 %
Eosinophils Absolute: 101 cells/uL (ref 15–500)
Eosinophils Relative: 1 %
HCT: 37.7 % (ref 35.0–45.0)
Hemoglobin: 12.4 g/dL (ref 11.7–15.5)
Lymphs Abs: 3212 cells/uL (ref 850–3900)
MCH: 28.7 pg (ref 27.0–33.0)
MCHC: 32.9 g/dL (ref 32.0–36.0)
MCV: 87.3 fL (ref 80.0–100.0)
MPV: 9.6 fL (ref 7.5–12.5)
Monocytes Relative: 5.4 %
Neutro Abs: 6171 cells/uL (ref 1500–7800)
Neutrophils Relative %: 61.1 %
Platelets: 495 10*3/uL — ABNORMAL HIGH (ref 140–400)
RBC: 4.32 10*6/uL (ref 3.80–5.10)
RDW: 13.6 % (ref 11.0–15.0)
Total Lymphocyte: 31.8 %
WBC: 10.1 10*3/uL (ref 3.8–10.8)

## 2021-01-01 LAB — COMPLETE METABOLIC PANEL WITH GFR
AG Ratio: 1.7 (calc) (ref 1.0–2.5)
ALT: 19 U/L (ref 6–29)
AST: 17 U/L (ref 10–35)
Albumin: 4.5 g/dL (ref 3.6–5.1)
Alkaline phosphatase (APISO): 88 U/L (ref 37–153)
BUN/Creatinine Ratio: 25 (calc) — ABNORMAL HIGH (ref 6–22)
BUN: 32 mg/dL — ABNORMAL HIGH (ref 7–25)
CO2: 25 mmol/L (ref 20–32)
Calcium: 10.3 mg/dL (ref 8.6–10.4)
Chloride: 102 mmol/L (ref 98–110)
Creat: 1.27 mg/dL — ABNORMAL HIGH (ref 0.60–1.00)
Globulin: 2.7 g/dL (calc) (ref 1.9–3.7)
Glucose, Bld: 91 mg/dL (ref 65–99)
Potassium: 4.8 mmol/L (ref 3.5–5.3)
Sodium: 139 mmol/L (ref 135–146)
Total Bilirubin: 0.5 mg/dL (ref 0.2–1.2)
Total Protein: 7.2 g/dL (ref 6.1–8.1)
eGFR: 44 mL/min/{1.73_m2} — ABNORMAL LOW (ref >=60)

## 2021-01-09 ENCOUNTER — Other Ambulatory Visit: Payer: Self-pay | Admitting: Osteopathic Medicine

## 2021-01-11 NOTE — Telephone Encounter (Signed)
Please call patient and let her know that I did refill her clonazepam today.  But she really needs to start working on decreasing her dose and not using 2 tabs every day.  Based on her age and risks with this medication for developing dementia and increased risk of falls it is highly recommended that we work on decreasing the frequency and dose.  Prescription sent for 55 tabs instead of 60 so I would really encourage her to try to make this last 30 days.

## 2021-01-11 NOTE — Telephone Encounter (Signed)
Routing to covering provider.  °

## 2021-01-12 NOTE — Telephone Encounter (Signed)
Pt advised of recommendations and expressed understanding.

## 2021-01-16 ENCOUNTER — Other Ambulatory Visit: Payer: Self-pay | Admitting: Osteopathic Medicine

## 2021-01-23 ENCOUNTER — Other Ambulatory Visit (HOSPITAL_COMMUNITY): Payer: Self-pay | Admitting: Psychiatry

## 2021-01-23 ENCOUNTER — Other Ambulatory Visit: Payer: Self-pay | Admitting: Osteopathic Medicine

## 2021-01-23 DIAGNOSIS — F5101 Primary insomnia: Secondary | ICD-10-CM

## 2021-01-31 ENCOUNTER — Encounter (HOSPITAL_COMMUNITY): Payer: Self-pay | Admitting: Psychiatry

## 2021-01-31 ENCOUNTER — Other Ambulatory Visit: Payer: Self-pay

## 2021-01-31 ENCOUNTER — Ambulatory Visit (INDEPENDENT_AMBULATORY_CARE_PROVIDER_SITE_OTHER): Payer: Medicare Other | Admitting: Psychiatry

## 2021-01-31 DIAGNOSIS — F331 Major depressive disorder, recurrent, moderate: Secondary | ICD-10-CM

## 2021-01-31 DIAGNOSIS — F418 Other specified anxiety disorders: Secondary | ICD-10-CM

## 2021-01-31 MED ORDER — VENLAFAXINE HCL ER 150 MG PO CP24
150.0000 mg | ORAL_CAPSULE | Freq: Every day | ORAL | 3 refills | Status: DC
Start: 2021-01-31 — End: 2021-04-20

## 2021-01-31 NOTE — Progress Notes (Signed)
BH MD/PA/NP OP Progress Note      01/31/2021 33:82 PM [name]  MRN:  [id number]  Chief Complaint: Im still not sleeping and doxepin has been a Metallurgist Complaint   Medication Management     HPI: 75 year old female seen today for follow up psychiatric evaluation.   She has a psychiatric history of insomnia, anxiety, depression, and passive SI.  She is currently being managed on, Effexor 150 mg daily, Klonopin 0.5 twice daily (perscribed by PCP), and Restoril 15 mg nightly. She notes her medications are somewhat effective in managing her psychiatric conditions.    Today she is well-groomed, pleasant, cooperative, and engaged in conversation. She informed provider that she discontinued doxepin after a few weeks.  She notes that it caused her to sleep for 12 hours.  She notes that when she woke up she felt jittery and anxious.  She reports that Restoril is becoming ineffective and request Ambien.  Provider informed her that Restoril was just refilled and Ambien would not be started at this time.  Provider informed patient that if sleep is a continued problem in the [place] Year then Ambien can be considered.  Provider however informed patient that switching back and forth between hypnotics and benzos were not recommended and would not be practiced.  She endorsed understanding and agreed.    Patient informed writer that besides his sleep she feels mentally stable.  She notes that she has very little anxiety and depression.  Provider conducted a GAD-7 and patient scored a 4, at her last visit she scored a 9.  Provider also conducted PHQ-9 he scored a 4, at her last visit she scored a 7.  She notes that her appetite is adequate and reports that she has lost 50 pounds total on Ozempic.  She informed Probation officer that she feels physically better.  Today she denies SI/HI/VAH, mania, paranoia   Patient informed provider that she has been enjoying the holiday seasons.  She notes in October she took  a river cruise in [place] however notes that she developed COVID.  She informed Probation officer that she spent the Thanksgiving holiday with her son and daughter-in-law.  She notes that she plans to do the same for Christmas and he is excited.    At this time Restoril not filled I do not filled as it was recently filled on 01/24/2021.  Effexor sent to preferred pharmacy.  In the new year Restoril may be discontinued and Ambien will be restarted.  Provider again informed patient that switching between hypnotic and benzo was not recommended and would not be a common practice.  She endorsed understanding and agreed.  No other concerns at this time.       Visit Diagnosis:    ICD-10-CM   1. Anxiety associated with depression  F41.8 venlafaxine XR (EFFEXOR-XR) 150 MG 24 hr capsule    2. Moderate episode of recurrent major depressive disorder (HCC)  F33.1 venlafaxine XR (EFFEXOR-XR) 150 MG 24 hr capsule      Past Psychiatric History:  insomnia, anxiety, depression, and passive SI.  Past Medical History:  Past Medical History:  Diagnosis Date   Anxiety    Depression    Headache    SINUS   Hyperlipidemia    Hypertension    Osteopenia    Primary localized osteoarthritis of right knee     Past Surgical History:  Procedure Laterality Date   BREAST EXCISIONAL BIOPSY Right    benign cyst removal    CARPAL TUNNEL  RELEASE Right 2015   Ortho in [place] Right 2011   Orthopedic in [place] Right 09/20/2015   TOTAL KNEE ARTHROPLASTY Right 09/20/2015   Procedure: TOTAL KNEE ARTHROPLASTY;  Surgeon: Elsie Saas, MD;  Location: [facility];  Service: Orthopedics;  Laterality: Right;   Trigger Thumb Right 2015   Done in [facility] at the same time as the carpal tunnel release   [place]    Family Psychiatric History: Mother anxiety, maternal aunts alcohol use, maternal aunt anorexia Family History:  Family History  Problem Relation Age of Onset    Heart failure Mother    Heart attack Mother    Alcohol abuse Maternal Aunt    Heart attack Father    Irregular heart beat Sister    Hypertension Sister    Heart disease Brother     Social History:  Social History   Socioeconomic History   Marital status: Married    Spouse name: Lawrence   Number of children: 1   Years of education: 17.5   Highest education level: Conservator, museum/gallery (e.g., MA, MS, MEng, MEd, MSW, MBA)  Occupational History   Occupation: retired    Comment: Licensed conveyancer  Tobacco Use   Smoking status: Never   Smokeless tobacco: Never  Vaping Use   Vaping Use: Never used  Substance and Sexual Activity   Alcohol use: No    Comment: 1 glass a month   Drug use: No   Sexual activity: Yes  Other Topics Concern   Not on file  Social History Narrative   Reads paper, walks, housework errands. Caffeine use daily   Social Determinants of Health   Financial Resource Strain: Low Risk    Difficulty of Paying Living Expenses: Not hard at all  Food Insecurity: No Food Insecurity   Worried About Charity fundraiser in the Last Year: Never true   Arboriculturist in the Last Year: Never true  Transportation Needs: No Transportation Needs   Lack of Transportation (Medical): No   Lack of Transportation (Non-Medical): No  Physical Activity: Inactive   Days of Exercise per Week: 0 days   Minutes of Exercise per Session: 0 min  Stress: No Stress Concern Present   Feeling of Stress : Not at all  Social Connections: Moderately Isolated   Frequency of Communication with Friends and Family: Three times a week   Frequency of Social Gatherings with Friends and Family: Once a week   Attends Religious Services: Never   Marine scientist or Organizations: No   Attends Archivist Meetings: Never   Marital Status: Married    Allergies:  Allergies  Allergen Reactions   Penicillins Hives    Tolerates Cephalosporins    Sulfa Antibiotics Hives         Metabolic  Disorder Labs: Lab Results  Component Value Date   HGBA1C 5.7 12/31/2020   MPG 131 01/17/2019   MPG 128 11/28/2017   No results found for: PROLACTIN Lab Results  Component Value Date   CHOL 156 06/30/2020   TRIG 124 06/30/2020   HDL 58 06/30/2020   CHOLHDL 2.7 06/30/2020   LDLCALC 77 06/30/2020   [place] 75 06/30/2019   Lab Results  Component Value Date   TSH 4.01 01/08/2017   TSH 1.93 12/07/2015    Therapeutic Level Labs: No results found for: LITHIUM No results found for: VALPROATE No components found for:  CBMZ  Current Medications: Current Outpatient Medications  Medication Sig Dispense Refill   amLODipine-benazepril (LOTREL) 5-20 MG capsule Take 1 capsule by mouth daily. 90 capsule 3   atorvastatin (LIPITOR) 20 MG tablet TAKE ONE TABLET BY MOUTH ONE TIME A DAY 90 tablet 3   clonazePAM (KLONOPIN) 0.5 MG tablet TAKE 1/2 TO 1 TABLET BY MOUTH TWO TIMES A DAY AS NEEDED   **DO NOT TAKE WITH TEMAZEPAM **. This is a 30 days supply 55 tablet 0   fluticasone (FLONASE) 50 MCG/ACT nasal spray Place into both nostrils daily.     raloxifene (EVISTA) 60 MG tablet TAKE ONE TABLET BY MOUTH DAILY 90 tablet 0   Semaglutide,0.25 or 0.5MG /DOS, (OZEMPIC, 0.25 OR 0.5 MG/DOSE,) 2 MG/1.5ML SOPN Inject 0.5 mg into the skin once a week. 7.5 mL 5   temazepam (RESTORIL) 15 MG capsule TAKE ONE CAPSULE BY MOUTH EVERY NIGHT AT BEDTIME AS NEEDED 30 capsule 2   triamterene-hydrochlorothiazide (DYAZIDE) 37.5-25 MG capsule TAKE ONE CAPSULE BY MOUTH ONE TIME A DAY 90 capsule 3   venlafaxine XR (EFFEXOR-XR) 150 MG 24 hr capsule Take 1 capsule (150 mg total) by mouth daily with breakfast. 90 capsule 3   No current facility-administered medications for this visit.     Musculoskeletal: Strength & Muscle Tone: within normal limits Gait & Station: normal Patient leans: N/A  Psychiatric Specialty Exam: Review of Systems  Blood pressure 131/63, pulse 84, height 5\' 2"  (1.575 m), weight 154 lb (69.9  kg).Body mass index is 28.17 kg/m.  General Appearance: Well Groomed  Eye Contact:  Good  Speech:  Clear and Coherent and Normal Rate  Volume:  Normal  Mood:  Euthymic  Affect:  Congruent  Thought Process:  Coherent, Goal Directed and Linear  Orientation:  Full (Time, Place, and Person)  Thought Content: WDL and Logical   Suicidal Thoughts:  No  Homicidal Thoughts:  No  Memory:  Immediate;   Good Recent;   Good Remote;   Good  Judgement:  Good  Insight:  Good  Psychomotor Activity:  Normal  Concentration:  Concentration: Good and Attention Span: Good  Recall:  Good  Fund of Knowledge: Good  Language: Good  Akathisia:  No  Handed:  Right  AIMS (if indicated): Not done  Assets:  Communication Skills Desire for Improvement Financial Resources/Insurance Housing Social Support  ADL's:  Intact  Cognition: WNL  Sleep:  Fair   Screenings: GAD-7    Flowsheet Row Clinical Support from 01/31/2021 in [facility] Video Visit from 10/27/2020 in [facility] Video Visit from 07/27/2020 in [facility] from 04/29/2020 in [facility] Video Visit from 01/30/2020 in [facility]  Total GAD-7 Score 4 9 10 6 5       PHQ2-9    Flowsheet Row Clinical Support from 01/31/2021 in [facility] Video Visit from 10/27/2020 in [facility] Video Visit from 07/27/2020 in [facility] from 04/29/2020 in [facility] Office Visit from 02/27/2020 in [facility] [place]  PHQ-2 Total Score 1 2 4 1  0  PHQ-9 Total Score 4 7 12 2  0      Flowsheet Row Clinical Support from 04/29/2020 in [place] No Risk        Assessment and Plan: Patient  reports that her  sleep continues to fluctuate.  Patient notes that she discontinued doxepin and reports that she does not want to restart.  Patient requested Ambien be restarted.  Provider informed patient that Ambien would not be restarted as she is currently on Klonopin and Restoril.  Provider informed patient that in the new year Ambien will be considered however informed her that hypnotics and benzos would not use, prior to switching back and forth.  She endorsed understanding and agreed.  At this time Restoril not refilled as it was recently refilled.  1. Anxiety associated with depression  Continue- venlafaxine XR (EFFEXOR-XR) 150 MG 24 hr capsule; Take 1 capsule (150 mg total) by mouth daily with breakfast.  Dispense: 90 capsule; Refill: 3  2. Moderate episode of recurrent major depressive disorder (HCC)  Continue- venlafaxine XR (EFFEXOR-XR) 150 MG 24 hr capsule; Take 1 capsule (150 mg total) by mouth daily with breakfast.  Dispense: 90 capsule; Refill: 3      Follow-up in 3 months   Salley Slaughter, NP 01/31/2021, 12:17 PM

## 2021-02-08 ENCOUNTER — Encounter: Payer: Self-pay | Admitting: Sports Medicine

## 2021-02-09 ENCOUNTER — Encounter: Payer: Self-pay | Admitting: Medical-Surgical

## 2021-02-09 ENCOUNTER — Telehealth (INDEPENDENT_AMBULATORY_CARE_PROVIDER_SITE_OTHER): Payer: Medicare Other | Admitting: Medical-Surgical

## 2021-02-09 VITALS — Temp 97.8°F

## 2021-02-09 DIAGNOSIS — F411 Generalized anxiety disorder: Secondary | ICD-10-CM

## 2021-02-09 DIAGNOSIS — B9689 Other specified bacterial agents as the cause of diseases classified elsewhere: Secondary | ICD-10-CM | POA: Diagnosis not present

## 2021-02-09 DIAGNOSIS — J019 Acute sinusitis, unspecified: Secondary | ICD-10-CM | POA: Diagnosis not present

## 2021-02-09 MED ORDER — CEFDINIR 300 MG PO CAPS: 300.0000 mg | ORAL_CAPSULE | Freq: Two times a day (BID) | ORAL | 0 refills | Status: DC

## 2021-02-09 MED ORDER — CLONAZEPAM 0.5 MG PO TABS: ORAL_TABLET | ORAL | 0 refills | Status: DC

## 2021-02-09 NOTE — Progress Notes (Signed)
Virtual Visit via Video Note  I connected with [name] on 79/89/21 at 11:10 AM EST by a video enabled telemedicine application and verified that I am speaking with the correct person using two identifiers.   I discussed the limitations of evaluation and management by telemedicine and the availability of in person appointments. The patient expressed understanding and agreed to proceed.  Patient location: home Provider locations: office  Subjective:    CC:  sinus issues  HPI: Pleasant 75 year old female presenting today via MyChart video visit with reports of a couple of weeks of sinus pressure, headache, and nasal discharge that is described as bloody and thick.  She has noted some clots coming out of her nose at times.  She does have a long history of sinus issues and thought this would pass with time.  Unfortunately it seems to be getting worse.  She now has increased drainage, postnasal drip, chills and a bit of diarrhea.  Notes that the diarrhea is always caused when she has this much drainage going down her throat.  She has tried multiple cold medications, saline nasal sprays/rinses and Sudafed but nothing has been helpful so far.  Past medical history, Surgical history, Family history not pertinant except as noted below, Social history, Allergies, and medications have been entered into the medical record, reviewed, and corrections made.   Review of Systems: See HPI for pertinent positives and negatives.   Objective:    General: Speaking clearly in complete sentences without any shortness of breath.  Alert and oriented x3.  Normal judgment. No apparent acute distress.  Impression and Recommendations:    1. Acute bacterial rhinosinusitis On review of allergies, she does appear to be allergic to penicillins but tolerates cephalosporins.  Sending in cefdinir twice daily x7 days.  Continue adjunctive treatment with saline nasal sprays/rinses, humidification, and push p.o.  fluids.  Okay to use Tylenol/ibuprofen as needed. - cefdinir (OMNICEF) 300 MG capsule; Take 1 capsule (300 mg total) by mouth 2 (two) times daily.  Dispense: 14 capsule; Refill: 0  2. Generalized anxiety disorder Refilling clonazepam as requested.  I discussed the assessment and treatment plan with the patient. The patient was provided an opportunity to ask questions and all were answered. The patient agreed with the plan and demonstrated an understanding of the instructions.   The patient was advised to call back or seek an in-person evaluation if the symptoms worsen or if the condition fails to improve as anticipated.  25 minutes of non-face-to-face time was provided during this encounter.  Return if symptoms worsen or fail to improve.  Clearnce Sorrel, DNP, APRN, FNP-BC [facility]

## 2021-02-11 ENCOUNTER — Ambulatory Visit: Payer: Medicare Other | Admitting: Sports Medicine

## 2021-02-16 ENCOUNTER — Other Ambulatory Visit: Payer: Self-pay

## 2021-02-16 ENCOUNTER — Ambulatory Visit (INDEPENDENT_AMBULATORY_CARE_PROVIDER_SITE_OTHER): Payer: Medicare Other

## 2021-02-16 ENCOUNTER — Ambulatory Visit (INDEPENDENT_AMBULATORY_CARE_PROVIDER_SITE_OTHER): Payer: Medicare Other | Admitting: Sports Medicine

## 2021-02-16 DIAGNOSIS — M7541 Impingement syndrome of right shoulder: Secondary | ICD-10-CM

## 2021-02-16 DIAGNOSIS — M7542 Impingement syndrome of left shoulder: Secondary | ICD-10-CM

## 2021-02-16 NOTE — Progress Notes (Signed)
° ° °  Procedures performed today:    Procedure: Real-time Ultrasound Guided injection of the left subacromial bursa Device: Samsung HS60  Verbal informed consent obtained.  Time-out conducted.  Noted no overlying erythema, induration, or other signs of local infection.  Skin prepped in a sterile fashion.  Local anesthesia: Topical Ethyl chloride.  With sterile technique and under real time ultrasound guidance: Noted intact cuff, 1 cc Kenalog 40, 1 cc lidocaine, 1 cc bupivacaine injected easily Completed without difficulty  Advised to call if fevers/chills, erythema, induration, drainage, or persistent bleeding.  Images permanently stored and available for review in PACS.  Impression: Technically successful ultrasound guided injection.  Independent interpretation of notes and tests performed by another provider:   None.  Brief History, Exam, Impression, and Recommendations:    Impingement syndrome, shoulder, right Right shoulder impingement syndrome is doing well with a subacromial injection, continue conditioning, return as needed for this.  Impingement syndrome, shoulder, left Increasing impingement type symptoms on the left side, she did well after a subacromial injection on the right after failure of conservative treatment, today she is requesting that we just do a subacromial injection on the left, performed today, do conditioning, return in 4 to 6 weeks as needed.  Chronic process with exacerbation and pharmacologic intervention  ___________________________________________ Gwen Her. Dianah Field, M.D., ABFM., CAQSM. Primary Care and [place] Instructor of [employer] [facility] [employer]

## 2021-02-16 NOTE — Assessment & Plan Note (Signed)
Right shoulder impingement syndrome is doing well with a subacromial injection, continue conditioning, return as needed for this.

## 2021-02-16 NOTE — Assessment & Plan Note (Signed)
Increasing impingement type symptoms on the left side, she did well after a subacromial injection on the right after failure of conservative treatment, today she is requesting that we just do a subacromial injection on the left, performed today, do conditioning, return in 4 to 6 weeks as needed.

## 2021-02-24 ENCOUNTER — Telehealth (HOSPITAL_COMMUNITY): Payer: Self-pay | Admitting: *Deleted

## 2021-02-24 NOTE — Telephone Encounter (Signed)
VM from patient stating when she saw Dr Ronne Binning last they discussed changing her sleeping medicine from Temazepam to Ambien and she would like to do that. Her next appt with Dr Ronne Binning is on 04/20/21. Will forward this concern to Dr Ronne Binning.

## 2021-02-25 ENCOUNTER — Other Ambulatory Visit (HOSPITAL_COMMUNITY): Payer: Self-pay | Admitting: Psychiatry

## 2021-02-25 MED ORDER — ZOLPIDEM TARTRATE 5 MG PO TABS: 5.0000 mg | ORAL_TABLET | Freq: Every evening | ORAL | 2 refills | Status: DC | PRN

## 2021-02-25 NOTE — Telephone Encounter (Signed)
Provider spoke to patient and notes that her sleep continues to be poor with Restoril. She notes that she sleeps 3-5 hours nightly. Provider informed patient that Ambien and Restoril will no longer be switched to prevent adverse effects and dependence. She endorsed understanding and agreed. Patient notes that if she continue to have difficulty sleeping she will seek a second opinion to help manage sleep. Provider endorsed understanding and agreed.   Per chart review ar patients sleep study the following was noted, "For this mild degree of uncomplicated apnea treatment is optional and could consist of a dental device, if the patient wishes to pursue.  I cannot correlate the patient's report of insomnia with the data seen here CPAP is not indicated".    No other concerns noted at this time.

## 2021-03-09 ENCOUNTER — Other Ambulatory Visit: Payer: Medicare Other

## 2021-03-10 ENCOUNTER — Other Ambulatory Visit: Payer: Self-pay | Admitting: Medical-Surgical

## 2021-03-11 NOTE — Telephone Encounter (Signed)
Last OV and refill 02/09/2022

## 2021-03-12 ENCOUNTER — Encounter: Payer: Self-pay | Admitting: Medical-Surgical

## 2021-03-14 ENCOUNTER — Other Ambulatory Visit: Payer: Self-pay | Admitting: Medical-Surgical

## 2021-03-14 MED ORDER — CLONAZEPAM 0.5 MG PO TABS: ORAL_TABLET | ORAL | 0 refills | Status: DC

## 2021-03-14 NOTE — Telephone Encounter (Signed)
Restoril was discontinued by me on 02/24/2021. I have told the patient that she should be tapered off of Clonazepam as well. I too refuse to fill it as I am concerned for her. As of now she is managed on Ambien for sleep. I have made it clear to the patient that I will no longer prescribe Temazepam. Per chart review ar patients sleep study the following was noted, "For this mild degree of uncomplicated apnea treatment is optional and could consist of a dental device, if the patient wishes to pursue.  I cannot correlate the patient's report of insomnia with the data seen here CPAP is not indicated".

## 2021-03-27 ENCOUNTER — Other Ambulatory Visit: Payer: Self-pay | Admitting: Osteopathic Medicine

## 2021-04-01 ENCOUNTER — Encounter: Payer: Self-pay | Admitting: Medical-Surgical

## 2021-04-01 ENCOUNTER — Other Ambulatory Visit: Payer: Self-pay | Admitting: Medical-Surgical

## 2021-04-12 ENCOUNTER — Other Ambulatory Visit: Payer: Self-pay | Admitting: Medical-Surgical

## 2021-04-12 NOTE — Telephone Encounter (Signed)
Will refill Klonopin for the next 2 months at 50 tablets for 30 days. At that time, will continue taper with plan for 45 tablets for 30 days for 3 months.

## 2021-04-13 NOTE — Telephone Encounter (Signed)
Left VM that refill was sent to pharmacy but in the coming months we will continue with plan for taper.  ?

## 2021-04-18 ENCOUNTER — Ambulatory Visit (INDEPENDENT_AMBULATORY_CARE_PROVIDER_SITE_OTHER): Payer: Medicare Other

## 2021-04-18 ENCOUNTER — Ambulatory Visit (INDEPENDENT_AMBULATORY_CARE_PROVIDER_SITE_OTHER): Payer: Medicare Other | Admitting: Sports Medicine

## 2021-04-18 ENCOUNTER — Other Ambulatory Visit: Payer: Self-pay

## 2021-04-18 DIAGNOSIS — M7541 Impingement syndrome of right shoulder: Secondary | ICD-10-CM

## 2021-04-18 NOTE — Assessment & Plan Note (Signed)
Right shoulder starting to hurt again, we last injected this in November 2022. ?Repeat injection today. ?The left shoulder was injected back in January 2023 and is continuing to do well. ?

## 2021-04-18 NOTE — Progress Notes (Signed)
? ? ?  Procedures performed today:   ? ?Procedure: Real-time Ultrasound Guided injection of the right subacromial bursa ?Device: Samsung HS60  ?Verbal informed consent obtained.  ?Time-out conducted.  ?Noted no overlying erythema, induration, or other signs of local infection.  ?Skin prepped in a sterile fashion.  ?Local anesthesia: Topical Ethyl chloride.  ?With sterile technique and under real time ultrasound guidance: Noted mild bursitis, mild rotator cuff tearing, 1 cc Kenalog 40, 1 cc lidocaine, 1 cc bupivacaine injected easily ?Completed without difficulty  ?Advised to call if fevers/chills, erythema, induration, drainage, or persistent bleeding.  ?Images permanently stored and available for review in PACS.  ?Impression: Technically successful ultrasound guided injection. ? ?Independent interpretation of notes and tests performed by another provider:  ? ?None. ? ?Brief History, Exam, Impression, and Recommendations:   ? ?Impingement syndrome, shoulder, right ?Right shoulder starting to hurt again, we last injected this in November 2022. ?Repeat injection today. ?The left shoulder was injected back in January 2023 and is continuing to do well. ? ? ? ?___________________________________________ ?Gwen Her. Dianah Field, M.D., ABFM., CAQSM. ?Primary Care and Sports Medicine ?[place] ? ?Adjunct Instructor of Family Medicine  ?[employer] ?

## 2021-04-20 ENCOUNTER — Telehealth (INDEPENDENT_AMBULATORY_CARE_PROVIDER_SITE_OTHER): Payer: Medicare Other | Admitting: Psychiatry

## 2021-04-20 DIAGNOSIS — F5101 Primary insomnia: Secondary | ICD-10-CM | POA: Diagnosis not present

## 2021-04-20 DIAGNOSIS — F331 Major depressive disorder, recurrent, moderate: Secondary | ICD-10-CM | POA: Diagnosis not present

## 2021-04-20 DIAGNOSIS — F418 Other specified anxiety disorders: Secondary | ICD-10-CM

## 2021-04-20 MED ORDER — VENLAFAXINE HCL ER 150 MG PO CP24: 150.0000 mg | ORAL_CAPSULE | Freq: Every day | ORAL | 3 refills | Status: DC

## 2021-04-20 MED ORDER — ZOLPIDEM TARTRATE 5 MG PO TABS: 5.0000 mg | ORAL_TABLET | Freq: Every evening | ORAL | 0 refills | Status: DC | PRN

## 2021-04-20 NOTE — Progress Notes (Signed)
BH MD/PA/NP OP Progress Note  04/20/2021 6:21 PM [name]  MRN:  [id number]  Virtual Visit via Video Note  I connected with [name] on [birth year] at  2:00 PM EST by a video enabled telemedicine application and verified that I am speaking with the correct person using two identifiers.  Location: Patient: home Provider: off site   I discussed the limitations of evaluation and management by telemedicine and the availability of in person appointments. The patient expressed understanding and agreed to proceed.    I discussed the assessment and treatment plan with the patient. The patient was provided an opportunity to ask questions and all were answered. The patient agreed with the plan and demonstrated an understanding of the instructions.   The patient was advised to call back or seek an in-person evaluation if the symptoms worsen or if the condition fails to improve as anticipated.  I provided 20 minutes of non-face-to-face time during this encounter.   Franne Grip, NP   Chief Complaint: Medication management  HPI: [name] is a 76 year old female presenting to [facility] behavioral health outpatient for a follow up psychiatric evaluation. She has a psychiatric history of generalized anxiety disorder, major depressive disorder and insomnia.  Her symptoms are managed with Ambien 5 mg at bedtime and Effexor 150 mg daily.  Patient reports medication compliance.  Patient denies adverse medication effects.  Patient reports that she is still not sleeping and depressed about it.  Patient reports a couple out hours of sleep last night and some dozing today.  Patient reports difficulty sleeping for the last 50 years. "When I sleep, I'm okay".  Patient is alert and oriented, calm, pleasant and willing to engage.  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  Patient reports that she reasonably can do.  Patient encouraged to limit bluelight exposure 1  hour prior to bedtime to promote melatonin production for circadian rhythm regulation and to promote sleepiness.  Patient reports feeling depressed today due to not getting any sleep last night.  Patient reports that her sleep cycle has declined over the past year significantly.  Patient denies suicidal or homicidal ideations, paranoia, delusional thought, auditory or visual hallucinations.  Visit Diagnosis:    ICD-10-CM   1. Primary insomnia  F51.01     2. Anxiety associated with depression  F41.8 venlafaxine XR (EFFEXOR-XR) 150 MG 24 hr capsule    3. Moderate episode of recurrent major depressive disorder (HCC)  F33.1 venlafaxine XR (EFFEXOR-XR) 150 MG 24 hr capsule      Past Psychiatric History: Anxiety, insomnia, major depressive disorder  Past Medical History:  Past Medical History:  Diagnosis Date   Anxiety    Depression    Headache    SINUS   Hyperlipidemia    Hypertension    Osteopenia    Primary localized osteoarthritis of right knee     Past Surgical History:  Procedure Laterality Date   BREAST EXCISIONAL BIOPSY Right    benign cyst removal    CARPAL TUNNEL RELEASE Right 2015   Ortho in [place] Right 2011   Orthopedic in [place] Right 09/20/2015   TOTAL KNEE ARTHROPLASTY Right 09/20/2015   Procedure: TOTAL KNEE ARTHROPLASTY;  Surgeon: Elsie Saas, MD;  Location: [place];  Service: Orthopedics;  Laterality: Right;   Trigger Thumb Right 2015   Done in [facility] at the same time as  the carpal tunnel release   TUBAL LIGATION  1980    Family Psychiatric History: See below  Family History:  Family History  Problem Relation Age of Onset   Heart failure Mother    Heart attack Mother    Alcohol abuse Maternal Aunt    Heart attack Father    Irregular heart beat Sister    Hypertension Sister    Heart disease Brother     Social History:  Social History   Socioeconomic History   Marital status: Married     Spouse name: Lawrence   Number of children: 1   Years of education: 17.5   Highest education level: Master's degree (e.g., MA, MS, MEng, MEd, MSW, MBA)  Occupational History   Occupation: retired    Comment: Licensed conveyancer  Tobacco Use   Smoking status: Never   Smokeless tobacco: Never  Vaping Use   Vaping Use: Never used  Substance and Sexual Activity   Alcohol use: No    Comment: 1 glass a month   Drug use: No   Sexual activity: Yes  Other Topics Concern   Not on file  Social History Narrative   Reads paper, walks, housework errands. Caffeine use daily   Social Determinants of Health   Financial Resource Strain: Not on file  Food Insecurity: Not on file  Transportation Needs: Not on file  Physical Activity: Not on file  Stress: Not on file  Social Connections: Not on file    Allergies:  Allergies  Allergen Reactions   Penicillins Hives    Tolerates Cephalosporins    Sulfa Antibiotics Hives         Metabolic Disorder Labs: Lab Results  Component Value Date   HGBA1C 5.7 12/31/2020   MPG 131 01/17/2019   MPG 128 11/28/2017   No results found for: PROLACTIN Lab Results  Component Value Date   CHOL 156 06/30/2020   TRIG 124 06/30/2020   HDL 58 06/30/2020   CHOLHDL 2.7 06/30/2020   LDLCALC 77 06/30/2020   LDLCALC 75 06/30/2019   Lab Results  Component Value Date   TSH 4.01 01/08/2017   TSH 1.93 12/07/2015    Therapeutic Level Labs: No results found for: LITHIUM No results found for: VALPROATE No components found for:  CBMZ  Current Medications: Current Outpatient Medications  Medication Sig Dispense Refill   amLODipine-benazepril (LOTREL) 5-20 MG capsule TAKE ONE CAPSULE BY MOUTH DAILY 90 capsule 3   atorvastatin (LIPITOR) 20 MG tablet TAKE ONE TABLET BY MOUTH ONE TIME A DAY 90 tablet 3   clonazePAM (KLONOPIN) 0.5 MG tablet TAKE 1/2 TO 1 TABLET BY MOUTH TWO TIMES A DAY AS NEEDED ** DO NOT TAKE WITH TEMAZEPAM** 50 tablet 1   fluticasone (FLONASE) 50  MCG/ACT nasal spray Place into both nostrils daily.     raloxifene (EVISTA) 60 MG tablet TAKE ONE TABLET BY MOUTH DAILY 90 tablet 0   triamterene-hydrochlorothiazide (DYAZIDE) 37.5-25 MG capsule TAKE ONE CAPSULE BY MOUTH ONE TIME A DAY 90 capsule 3   venlafaxine XR (EFFEXOR-XR) 150 MG 24 hr capsule Take 1 capsule (150 mg total) by mouth daily with breakfast. 90 capsule 3   zolpidem (AMBIEN) 5 MG tablet Take 1 tablet (5 mg total) by mouth at bedtime as needed for sleep. 30 tablet 0   No current facility-administered medications for this visit.     Musculoskeletal: Strength & Muscle Tone: N/A virtual visit Gait & Station: N/A Patient leans: N/A  Psychiatric Specialty Exam: Review of Systems  Psychiatric/Behavioral:  Positive for dysphoric mood and sleep disturbance. Negative for hallucinations, self-injury and suicidal ideas.   All other systems reviewed and are negative.  There were no vitals taken for this visit.There is no height or weight on file to calculate BMI.  General Appearance: Fairly Groomed  Eye Contact:  Good  Speech:  Clear and Coherent  Volume:  Normal  Mood:  Depressed  Affect:  Congruent  Thought Process:  Goal Directed  Orientation:  Full (Time, Place, and Person)  Thought Content: Logical   Suicidal Thoughts:  No  Homicidal Thoughts:  No  Memory:  Immediate;   Good  Judgement:  Good  Insight:  Good  Psychomotor Activity:  NA  Concentration:  Concentration: Good  Recall:  Good  Fund of Knowledge: Good  Language: Good  Akathisia:  NA  Handed:  Right  AIMS (if indicated): not done  Assets:  Communication Skills Desire for Improvement  ADL's:  Intact  Cognition: WNL  Sleep:  Poor   Screenings: GAD-7    Flowsheet Row Clinical Support from 01/31/2021 in [facility] Video Visit from 10/27/2020 in [facility] Video Visit from 07/27/2020 in [place] from  04/29/2020 in [facility] Video Visit from 01/30/2020 in [facility]  Total GAD-7 Score '4 9 10 6 5      '$ PHQ2-9    [place] from 01/31/2021 in [facility] Video Visit from 10/27/2020 in [facility] Video Visit from 07/27/2020 in [place] from 04/29/2020 in [facility] Office Visit from 02/27/2020 in [place]  PHQ-2 Total Score '1 2 4 1 '$ 0  PHQ-9 Total Score '4 7 12 2 '$ 0      Flowsheet Row Clinical Support from 04/29/2020 in [place] No Risk        Assessment and Plan: Arlys Scatena is a 76 year old female presenting to [facility] behavioral health outpatient for a follow up psychiatric evaluation. She has a psychiatric history of generalized anxiety disorder, major depressive disorder and insomnia.  Her symptoms are managed with Ambien 5 mg at bedtime and Effexor 150 mg daily.  Patient reports medication compliance.  Patient denies adverse medication effects.  Patient reports that she is still not sleeping and depressed about it.  Patient reports a couple out hours of sleep last night and some dozing today.  Patient reports difficulty sleeping for the last 50 years. "When I sleep, I'm okay".  Patient recommended for cognitive behavioral therapy for insomnia.  Patient in agreement with treatment plan.  No medication changes today and medications refilled.  Collaboration of Care: Collaboration of Care: Medication Management AEB medications E scribed to patient's preferred pharmacy and Referral or follow-up with counselor/therapist AEB patient recommended for individual psychotherapy for CBT for insomnia.  1. Anxiety associated with depression  - venlafaxine XR (EFFEXOR-XR) 150 MG 24 hr capsule;  Take 1 capsule (150 mg total) by mouth daily with breakfast.  Dispense: 90 capsule; Refill: 3  2. Moderate episode of recurrent major depressive disorder (HCC)  - venlafaxine XR (EFFEXOR-XR) 150 MG 24 hr capsule; Take 1 capsule (150 mg total) by mouth daily with breakfast.  Dispense: 90 capsule; Refill: 3  3. Primary insomnia CBT - zolpidem (AMBIEN) 5 MG tablet; Take 1  tablet (5 mg total) by mouth at bedtime as needed for sleep.  Dispense: 30 tablet; Refill: 0    Other orders  Follow-up in 6 weeks. Follow-up with therapy referral.   Patient/Guardian was advised Release of Information must be obtained prior to any record release in order to collaborate their care with an outside provider. Patient/Guardian was advised if they have not already done so to contact the registration department to sign all necessary forms in order for [place] to release information regarding their care.   Consent: Patient/Guardian gives verbal consent for treatment and assignment of benefits for services provided during this visit. Patient/Guardian expressed understanding and agreed to proceed.    Franne Grip, NP 04/20/2021, 2:26 PM

## 2021-05-06 ENCOUNTER — Encounter: Payer: Self-pay | Admitting: Medical-Surgical

## 2021-05-06 MED ORDER — RALOXIFENE HCL 60 MG PO TABS: 60.0000 mg | ORAL_TABLET | Freq: Every day | ORAL | 0 refills | Status: DC

## 2021-06-08 ENCOUNTER — Other Ambulatory Visit: Payer: Self-pay | Admitting: Medical-Surgical

## 2021-06-09 NOTE — Telephone Encounter (Signed)
Continuing slow Klonopin taper. Quantity dropping from 50 to 45 tablets to last 30 days.  ?

## 2021-06-27 ENCOUNTER — Telehealth (HOSPITAL_COMMUNITY): Payer: Self-pay | Admitting: *Deleted

## 2021-06-27 NOTE — Telephone Encounter (Signed)
Refill request for Ambien '5mg'$  faxed [facility]. Next appt 07/19/21.  ?

## 2021-06-27 NOTE — Telephone Encounter (Signed)
Patient called requesting a refill of Ambien. Her next appointment is 07/19/21. Message sent to MD for approval.  ?

## 2021-06-29 ENCOUNTER — Telehealth (HOSPITAL_COMMUNITY): Payer: Self-pay | Admitting: *Deleted

## 2021-06-29 ENCOUNTER — Other Ambulatory Visit (HOSPITAL_COMMUNITY): Payer: Self-pay | Admitting: Psychiatry

## 2021-06-29 MED ORDER — ZOLPIDEM TARTRATE 5 MG PO TABS: 5.0000 mg | ORAL_TABLET | Freq: Every evening | ORAL | 0 refills | Status: DC | PRN

## 2021-06-29 NOTE — Telephone Encounter (Signed)
VM from patient seeking rx for her ambien. She should have been out last month. She has a future appt on 07/19/21 and there is a message in chart re same request from Monday per Janece Canterbury. RN. Will resubmit the request.  ?

## 2021-06-29 NOTE — Telephone Encounter (Signed)
Notified patient her [name] rx was just called in to her preferred pharmacy. She said she will be needing her klonopin soon, reviewed record and she has a refill on file for that one and can pick it up when ever her pharmacy says its time. ?

## 2021-06-29 NOTE — Progress Notes (Signed)
Patient's Ambien refilled for a 30 day supply to bridge patient until her attending psychiatric provider is able to resume care. ?

## 2021-06-30 ENCOUNTER — Ambulatory Visit (INDEPENDENT_AMBULATORY_CARE_PROVIDER_SITE_OTHER): Payer: Medicare Other | Admitting: Medical-Surgical

## 2021-06-30 ENCOUNTER — Encounter: Payer: Self-pay | Admitting: Medical-Surgical

## 2021-06-30 VITALS — BP 125/75 | HR 71 | Resp 20 | Ht 62.0 in | Wt 162.0 lb

## 2021-06-30 DIAGNOSIS — F5101 Primary insomnia: Secondary | ICD-10-CM

## 2021-06-30 DIAGNOSIS — I10 Essential (primary) hypertension: Secondary | ICD-10-CM | POA: Diagnosis not present

## 2021-06-30 DIAGNOSIS — R7303 Prediabetes: Secondary | ICD-10-CM

## 2021-06-30 DIAGNOSIS — F411 Generalized anxiety disorder: Secondary | ICD-10-CM

## 2021-06-30 DIAGNOSIS — E782 Mixed hyperlipidemia: Secondary | ICD-10-CM | POA: Diagnosis not present

## 2021-06-30 LAB — POCT GLYCOSYLATED HEMOGLOBIN (HGB A1C): Hemoglobin A1C: 5.5 % (ref 4.0–5.6)

## 2021-06-30 MED ORDER — TRAZODONE HCL 50 MG PO TABS: 25.0000 mg | ORAL_TABLET | Freq: Every evening | ORAL | 3 refills | Status: DC | PRN

## 2021-06-30 MED ORDER — AMLODIPINE BESY-BENAZEPRIL HCL 5-20 MG PO CAPS: 1.0000 | ORAL_CAPSULE | Freq: Every day | ORAL | 3 refills | Status: DC

## 2021-06-30 MED ORDER — TRIAMTERENE-HCTZ 37.5-25 MG PO CAPS
ORAL_CAPSULE | ORAL | 3 refills | Status: DC
Start: 2021-06-30 — End: 2022-06-09

## 2021-06-30 MED ORDER — ATORVASTATIN CALCIUM 20 MG PO TABS: ORAL_TABLET | ORAL | 3 refills | Status: DC

## 2021-06-30 NOTE — Assessment & Plan Note (Signed)
POCT hemoglobin A1c 5.5% today which is improved from previous readings.  No need to restart Ozempic since this was poorly tolerated.  For now continue monitoring dietary intake of concentrated sweets and simple carbohydrates.  Regular intentional exercise is recommended along with maintenance of a healthy weight.

## 2021-06-30 NOTE — Assessment & Plan Note (Signed)
Checking lipid panel today.  Continue atorvastatin 20 mg daily.

## 2021-06-30 NOTE — Progress Notes (Signed)
Established Patient Office Visit  Subjective   Patient ID: [name], female    DOB: 1945-12-29  Age: 76 y.o. MRN: [id number]  Chief Complaint  Patient presents with   Follow-up    HPI Pleasant 76 year old female presenting today for the following:  HTN-taking amlodipine-benazepril and triamterene-HCTZ daily as prescribed, tolerating well without side effects. Denies CP, SOB, palpitations, lower extremity edema, dizziness, headaches, or vision changes.  Prediabetes- quite Ozempic a couple of months ago due to queasiness and constipation. Still having trouble with GI issues and notes that she did not have this before trying the Ozempic.  Is interested to see what her A1c looks like today  Insomnia- taking less sleep aids and having lots of trouble. Not on temazepam anymore.  Continues to use Ambien 5 mg nightly as well as clonazepam to manage anxiety.  She is being followed by psychiatry for insomnia as well as mood management but is unhappy with her services and feels as if they are just going through the motions.  She would like to be referred to behavioral therapy which was recommended as an adjunct to treatment as well as a new psychiatrist.  Review of Systems  Constitutional:  Negative for chills, fever and malaise/fatigue.  Respiratory:  Negative for cough, shortness of breath and wheezing.   Cardiovascular:  Negative for chest pain, palpitations and leg swelling.  Gastrointestinal:  Positive for constipation and nausea. Negative for diarrhea and vomiting.  Neurological:  Negative for dizziness and headaches.  Psychiatric/Behavioral:  Positive for depression. Negative for suicidal ideas. The patient is nervous/anxious and has insomnia.      Objective:     BP 125/75 (BP Location: Right Arm, Cuff Size: Normal)   Pulse 71   Resp 20   Ht '5\' 2"'$  (1.575 m)   Wt 162 lb 0.6 oz (73.5 kg)   SpO2 100%   BMI 29.64 kg/m    Physical Exam Vitals reviewed.  Constitutional:       General: She is not in acute distress.    Appearance: Normal appearance. She is not ill-appearing.  HENT:     Head: Normocephalic and atraumatic.  Cardiovascular:     Rate and Rhythm: Normal rate and regular rhythm.     Pulses: Normal pulses.     Heart sounds: Normal heart sounds. No murmur heard.   No friction rub. No gallop.  Pulmonary:     Effort: Pulmonary effort is normal. No respiratory distress.     Breath sounds: Normal breath sounds. No wheezing.  Skin:    General: Skin is warm and dry.  Neurological:     Mental Status: She is alert and oriented to person, place, and time.  Psychiatric:        Attention and Perception: Attention normal.        Mood and Affect: Mood is anxious.        Speech: Speech normal.        Behavior: Behavior normal.        Thought Content: Thought content normal.        Cognition and Memory: Cognition normal.        Judgment: Judgment normal.   Results for orders placed or performed in visit on 06/30/21  POCT HgB A1C  Result Value Ref Range   Hemoglobin A1C 5.5 4.0 - 5.6 %   HbA1c POC (<> result, manual entry)     HbA1c, POC (prediabetic range)     HbA1c, POC (controlled diabetic range)  The 10-year ASCVD risk score (Arnett DK, et al., 2019) is: 38.4%    Assessment & Plan:   Problem List Items Addressed This Visit       Cardiovascular and Mediastinum   Essential hypertension    Continue Dyazide and Lotrel as prescribed.  Blood pressure at goal today.  Monitor blood pressure at home with goal of 130/80 or less.  Low-sodium diet.  Recommend regular intentional exercise.       Relevant Medications   triamterene-hydrochlorothiazide (DYAZIDE) 37.5-25 MG capsule   atorvastatin (LIPITOR) 20 MG tablet   amLODipine-benazepril (LOTREL) 5-20 MG capsule   Other Relevant Orders   COMPLETE METABOLIC PANEL WITH GFR   Lipid panel     Other   Hyperlipidemia    Checking lipid panel today.  Continue atorvastatin 20 mg daily.        Relevant Medications   triamterene-hydrochlorothiazide (DYAZIDE) 37.5-25 MG capsule   atorvastatin (LIPITOR) 20 MG tablet   amLODipine-benazepril (LOTREL) 5-20 MG capsule   Prediabetes - Primary    POCT hemoglobin A1c 5.5% today which is improved from previous readings.  No need to restart Ozempic since this was poorly tolerated.  For now continue monitoring dietary intake of concentrated sweets and simple carbohydrates.  Regular intentional exercise is recommended along with maintenance of a healthy weight.       Relevant Orders   POCT HgB A1C (Completed)   Primary insomnia    Referrals entered for psychiatry and behavioral health for counseling.  At this point, continue Ambien at bedtime as needed but will need to continue at the limited 5 mg dose.  Adding a small dose of trazodone 25-50 mg nightly to see if this will help her sleep just a bit better and may be an adjunct to the Effexor she is already on.       Relevant Orders   Ambulatory referral to Psychiatry   Ambulatory referral to Behavioral Health   Generalized anxiety disorder    Managed by psychiatry.  New referral entered for psychiatry to get her in contact with someone else that may be a better fit.  Referring to behavioral health for counseling       Relevant Medications   traZODone (DESYREL) 50 MG tablet   Other Relevant Orders   Ambulatory referral to Psychiatry   Ambulatory referral to [facility]    Return in about 6 months (around 12/31/2021) for chronic disease follow up.  ___________________________________________ Clearnce Sorrel, DNP, APRN, FNP-BC Primary Care and [place]

## 2021-06-30 NOTE — Assessment & Plan Note (Signed)
Continue Dyazide and Lotrel as prescribed.  Blood pressure at goal today.  Monitor blood pressure at home with goal of 130/80 or less.  Low-sodium diet.  Recommend regular intentional exercise.

## 2021-06-30 NOTE — Assessment & Plan Note (Signed)
Managed by psychiatry.  New referral entered for psychiatry to get her in contact with someone else that may be a better fit.  Referring to behavioral health for counseling

## 2021-06-30 NOTE — Assessment & Plan Note (Signed)
Referrals entered for psychiatry and behavioral health for counseling.  At this point, continue Ambien at bedtime as needed but will need to continue at the limited 5 mg dose.  Adding a small dose of trazodone 25-50 mg nightly to see if this will help her sleep just a bit better and may be an adjunct to the Effexor she is already on.

## 2021-07-01 LAB — LIPID PANEL
Cholesterol: 201 mg/dL — ABNORMAL HIGH (ref <200)
HDL: 88 mg/dL (ref >=50)
LDL Cholesterol (Calc): 94 mg/dL (calc)
Non-HDL Cholesterol (Calc): 113 mg/dL (calc) (ref <130)
Total CHOL/HDL Ratio: 2.3 (calc) (ref <5.0)
Triglycerides: 95 mg/dL (ref <150)

## 2021-07-01 LAB — COMPLETE METABOLIC PANEL WITH GFR
AG Ratio: 1.6 (calc) (ref 1.0–2.5)
ALT: 22 U/L (ref 6–29)
AST: 24 U/L (ref 10–35)
Albumin: 4.8 g/dL (ref 3.6–5.1)
Alkaline phosphatase (APISO): 90 U/L (ref 37–153)
BUN/Creatinine Ratio: 22 (calc) (ref 6–22)
BUN: 24 mg/dL (ref 7–25)
CO2: 21 mmol/L (ref 20–32)
Calcium: 10.1 mg/dL (ref 8.6–10.4)
Chloride: 104 mmol/L (ref 98–110)
Creat: 1.1 mg/dL — ABNORMAL HIGH (ref 0.60–1.00)
Globulin: 3 g/dL (calc) (ref 1.9–3.7)
Glucose, Bld: 91 mg/dL (ref 65–99)
Potassium: 4.4 mmol/L (ref 3.5–5.3)
Sodium: 141 mmol/L (ref 135–146)
Total Bilirubin: 0.4 mg/dL (ref 0.2–1.2)
Total Protein: 7.8 g/dL (ref 6.1–8.1)
eGFR: 52 mL/min/{1.73_m2} — ABNORMAL LOW (ref >=60)

## 2021-07-03 ENCOUNTER — Encounter: Payer: Self-pay | Admitting: Medical-Surgical

## 2021-07-04 MED ORDER — MIRTAZAPINE 15 MG PO TABS: 15.0000 mg | ORAL_TABLET | Freq: Every day | ORAL | 1 refills | Status: DC

## 2021-07-19 ENCOUNTER — Encounter (HOSPITAL_COMMUNITY): Payer: Self-pay | Admitting: Psychiatry

## 2021-07-19 ENCOUNTER — Telehealth (INDEPENDENT_AMBULATORY_CARE_PROVIDER_SITE_OTHER): Payer: Medicare Other | Admitting: Psychiatry

## 2021-07-19 DIAGNOSIS — F5101 Primary insomnia: Secondary | ICD-10-CM

## 2021-07-19 DIAGNOSIS — F418 Other specified anxiety disorders: Secondary | ICD-10-CM | POA: Diagnosis not present

## 2021-07-19 DIAGNOSIS — F331 Major depressive disorder, recurrent, moderate: Secondary | ICD-10-CM

## 2021-07-19 MED ORDER — HYDROXYZINE HCL 25 MG PO TABS: 25.0000 mg | ORAL_TABLET | Freq: Every evening | ORAL | 3 refills | Status: DC | PRN

## 2021-07-19 MED ORDER — VENLAFAXINE HCL ER 150 MG PO CP24: 150.0000 mg | ORAL_CAPSULE | Freq: Every day | ORAL | 3 refills | Status: DC

## 2021-07-19 NOTE — Progress Notes (Signed)
[facility] MD/PA/NP OP Progress Note  Virtual Visit via Video Note  I connected with [name] on [birth year] at  2:30 PM EDT by a video enabled telemedicine application and verified that I am speaking with the correct person using two identifiers.  Location: Patient: Home Provider: Clinic   I discussed the limitations of evaluation and management by telemedicine and the availability of in person appointments. The patient expressed understanding and agreed to proceed.  I provided 30 minutes of non-face-to-face time during this encounter.     07/19/2021 1:95 PM [name]  MRN:  [id number]  Chief Complaint: "I feel terrible"    HPI: 76 year old female seen today for follow up psychiatric evaluation.   She has a psychiatric history of insomnia, anxiety, depression, and passive SI.  She is currently being managed on, Effexor 150 mg daily, Klonopin 0.5 twice daily (perscribed by PCP), and Ambien 5 mg nightly. She notes her medications are somewhat effective in managing her psychiatric conditions.    Today she is well-groomed, pleasant, cooperative, and engaged in conversation. She informed provider that she feels terrible.  She notes that she is only sleeping 2 to 3 hours nightly.  She informed Probation officer that her PCP attempted to try trazodone and mirtazapine in helping her manage her sleep however reports that they were ineffective.  She notes that she felt oversedated and experience diarrhea and nauseousness.  Patient has tried doxepin in the past without success.  Patient notes that her lack of sleep exacerbates her anxiety and depression.  Today she denies SI/HI/VAH, mania, paranoia.   Today patient agreeable to trying hydroxyzine 25 mg nightly to help manage anxiety and sleep.  She will continue Ambien and Effexor as prescribed.  No other concerns at this time.        Visit Diagnosis:    ICD-10-CM   1. Primary insomnia  F51.01 hydrOXYzine (ATARAX) 25 MG tablet    2.  Anxiety associated with depression  F41.8 hydrOXYzine (ATARAX) 25 MG tablet    venlafaxine XR (EFFEXOR-XR) 150 MG 24 hr capsule    3. Moderate episode of recurrent major depressive disorder (HCC)  F33.1 venlafaxine XR (EFFEXOR-XR) 150 MG 24 hr capsule      Past Psychiatric History:  insomnia, anxiety, depression, and passive SI.  Past Medical History:  Past Medical History:  Diagnosis Date   Anxiety    Depression    Headache    SINUS   Hyperlipidemia    Hypertension    Osteopenia    Primary localized osteoarthritis of right knee     Past Surgical History:  Procedure Laterality Date   BREAST EXCISIONAL BIOPSY Right    benign cyst removal    CARPAL TUNNEL RELEASE Right 2015   Ortho in [place] Right 2011   Orthopedic in [place] Right 09/20/2015   TOTAL KNEE ARTHROPLASTY Right 09/20/2015   Procedure: TOTAL KNEE ARTHROPLASTY;  Surgeon: Elsie Saas, MD;  Location: [facility];  Service: Orthopedics;  Laterality: Right;   Trigger Thumb Right 2015   Done in [facility] at the same time as the carpal tunnel release   [place]    Family Psychiatric History: Mother anxiety, maternal aunts alcohol use, maternal aunt anorexia Family History:  Family History  Problem Relation Age of Onset   Heart failure Mother    Heart attack Mother    Alcohol abuse Maternal Aunt    Heart attack  Father    Irregular heart beat Sister    Hypertension Sister    Heart disease Brother     Social History:  Social History   Socioeconomic History   Marital status: Married    Spouse name: Lawrence   Number of children: 1   Years of education: 17.5   Highest education level: Master's degree (e.g., MA, MS, MEng, MEd, MSW, MBA)  Occupational History   Occupation: retired    Comment: Licensed conveyancer  Tobacco Use   Smoking status: Never   Smokeless tobacco: Never  Vaping Use   Vaping Use: Never used  Substance and Sexual Activity   Alcohol  use: No    Comment: 1 glass a month   Drug use: No   Sexual activity: Yes  Other Topics Concern   Not on file  Social History Narrative   Reads paper, walks, housework errands. Caffeine use daily   Social Determinants of Health   Financial Resource Strain: Not on file  Food Insecurity: Not on file  Transportation Needs: Not on file  Physical Activity: Not on file  Stress: Not on file  Social Connections: Not on file    Allergies:  Allergies  Allergen Reactions   Penicillins Hives    Tolerates Cephalosporins    Sulfa Antibiotics Hives         Metabolic Disorder Labs: Lab Results  Component Value Date   HGBA1C 5.5 06/30/2021   MPG 131 01/17/2019   MPG 128 11/28/2017   No results found for: PROLACTIN Lab Results  Component Value Date   CHOL 201 (H) 06/30/2021   TRIG 95 06/30/2021   HDL 88 06/30/2021   CHOLHDL 2.3 06/30/2021   LDLCALC 94 06/30/2021   LDLCALC 77 06/30/2020   Lab Results  Component Value Date   TSH 4.01 01/08/2017   TSH 1.93 12/07/2015    Therapeutic Level Labs: No results found for: LITHIUM No results found for: VALPROATE No components found for:  CBMZ  Current Medications: Current Outpatient Medications  Medication Sig Dispense Refill   hydrOXYzine (ATARAX) 25 MG tablet Take 1 tablet (25 mg total) by mouth at bedtime as needed. 30 tablet 3   amLODipine-benazepril (LOTREL) 5-20 MG capsule Take 1 capsule by mouth daily. 90 capsule 3   atorvastatin (LIPITOR) 20 MG tablet TAKE ONE TABLET BY MOUTH ONE TIME A DAY 90 tablet 3   clonazePAM (KLONOPIN) 0.5 MG tablet TAKE ONE HALF TO ONE TABLET BY MOUTH TWO TIMES A DAY AS NEEDED *DO NOT TAKE WITH TEMAZEPAM* 45 tablet 2   fluticasone (FLONASE) 50 MCG/ACT nasal spray Place into both nostrils daily.     raloxifene (EVISTA) 60 MG tablet Take 1 tablet (60 mg total) by mouth daily. 90 tablet 0   triamterene-hydrochlorothiazide (DYAZIDE) 37.5-25 MG capsule TAKE ONE CAPSULE BY MOUTH ONE TIME A DAY 90  capsule 3   venlafaxine XR (EFFEXOR-XR) 150 MG 24 hr capsule Take 1 capsule (150 mg total) by mouth daily with breakfast. 90 capsule 3   zolpidem (AMBIEN) 5 MG tablet Take 1 tablet (5 mg total) by mouth at bedtime as needed for sleep. 30 tablet 0   No current facility-administered medications for this visit.     Musculoskeletal: Strength & Muscle Tone: within normal limits, telehealth visit Gait & Station: normal, telehealth visit Patient leans: N/A  Psychiatric Specialty Exam: Review of Systems  There were no vitals taken for this visit.There is no height or weight on file to calculate BMI.  General Appearance: Well Groomed  Eye Contact:  Good  Speech:  Clear and Coherent and Normal Rate  Volume:  Normal  Mood:  Anxious  Affect:  Appropriate and Congruent  Thought Process:  Coherent, Goal Directed and Linear  Orientation:  Full (Time, Place, and Person)  Thought Content: WDL and Logical   Suicidal Thoughts:  No  Homicidal Thoughts:  No  Memory:  Immediate;   Good Recent;   Good Remote;   Good  Judgement:  Good  Insight:  Good  Psychomotor Activity:  Normal  Concentration:  Concentration: Good and Attention Span: Good  Recall:  Good  Fund of Knowledge: Good  Language: Good  Akathisia:  No  Handed:  Right  AIMS (if indicated): Not done  Assets:  Communication Skills Desire for Improvement Financial Resources/Insurance Housing Social Support  ADL's:  Intact  Cognition: WNL  Sleep:  Poor   Screenings: GAD-7    Flowsheet Row Office Visit from 06/30/2021 in [facility] from 01/31/2021 in [facility] Video Visit from 10/27/2020 in [facility] Video Visit from 07/27/2020 in [place] from 04/29/2020 in [facility]  Total GAD-7 Score '14 4 9 10 6      '$ PHQ2-9    [facility] Office  Visit from 06/30/2021 in [facility] from 01/31/2021 in [facility] Video Visit from 10/27/2020 in [facility] Video Visit from 07/27/2020 in [facility] from 04/29/2020 in [facility]  PHQ-2 Total Score '4 1 2 4 1  '$ PHQ-9 Total Score '14 4 7 12 2      '$ Flowsheet Row Clinical Support from 04/29/2020 in [facility] No Risk        Assessment and Plan: Patient reports that her sleep continues to poor.  She reports that her PCP attempted to try mirtazapine and trazodone without success.  Patient has also tried doxepin in the past without success.  Today she is agreeable to trying hydroxyzine 25 mg nightly as needed to help manage sleep and anxiety.  Ambien not refilled as patient recent picked up prescription.  She will continue her other medications as prescribed.   1. Anxiety associated with depression  Start- hydrOXYzine (ATARAX) 25 MG tablet; Take 1 tablet (25 mg total) by mouth at bedtime as needed.  Dispense: 30 tablet; Refill: 3 Continue- venlafaxine XR (EFFEXOR-XR) 150 MG 24 hr capsule; Take 1 capsule (150 mg total) by mouth daily with breakfast.  Dispense: 90 capsule; Refill: 3  2. Moderate episode of recurrent major depressive disorder (HCC)  Continue- venlafaxine XR (EFFEXOR-XR) 150 MG 24 hr capsule; Take 1 capsule (150 mg total) by mouth daily with breakfast.  Dispense: 90 capsule; Refill: 3  3. Primary insomnia  Continue- hydrOXYzine (ATARAX) 25 MG tablet; Take 1 tablet (25 mg total) by mouth at bedtime as needed.  Dispense: 30 tablet; Refill: 3    Follow-up in 3 months   Salley Slaughter, NP 07/19/2021, 3:11 PM

## 2021-07-23 ENCOUNTER — Other Ambulatory Visit: Payer: Self-pay | Admitting: Medical-Surgical

## 2021-07-26 ENCOUNTER — Telehealth (HOSPITAL_COMMUNITY): Payer: Self-pay | Admitting: *Deleted

## 2021-07-26 ENCOUNTER — Ambulatory Visit
Admission: RE | Admit: 2021-07-26 | Discharge: 2021-07-26 | Disposition: A | Discharge status: Home / Self Care | Payer: Medicare Other | Source: Ambulatory Visit | Attending: Osteopathic Medicine | Admitting: Osteopathic Medicine

## 2021-07-26 ENCOUNTER — Other Ambulatory Visit (HOSPITAL_COMMUNITY): Payer: Self-pay | Admitting: Psychiatry

## 2021-07-26 DIAGNOSIS — M8580 Other specified disorders of bone density and structure, unspecified site: Secondary | ICD-10-CM

## 2021-07-26 DIAGNOSIS — M8589 Other specified disorders of bone density and structure, multiple sites: Secondary | ICD-10-CM | POA: Diagnosis not present

## 2021-07-26 DIAGNOSIS — Z780 Asymptomatic menopausal state: Secondary | ICD-10-CM

## 2021-07-26 MED ORDER — ZOLPIDEM TARTRATE 5 MG PO TABS: 7.5000 mg | ORAL_TABLET | Freq: Every evening | ORAL | 2 refills | Status: DC | PRN

## 2021-07-26 NOTE — Telephone Encounter (Signed)
Patient called, nearly out of her [place]. States the last time she saw the provider she was going to increase her dose from 5 mg to 7.5 mg but the chart MAR does not reflect this. I will forward message to [facility] NP and ask that she send in the [place] to patients preferred pharmacy.

## 2021-07-26 NOTE — Telephone Encounter (Signed)
Provider refilled Ambien 7.5 mg.  Patient is to take 1-1/2 tablets.

## 2021-07-27 ENCOUNTER — Telehealth (HOSPITAL_COMMUNITY): Payer: Self-pay | Admitting: *Deleted

## 2021-07-27 NOTE — Telephone Encounter (Signed)
Provider called pharmacy and was informed that they had received the Rx for Ambien yesterday.  Pharmacy notified writer that they incorrectly told the patient that her prescription was not sent.  Pharmacy notified writer that they then called the patient and informed her of their error.  No other concerns noted at this time.

## 2021-07-27 NOTE — Telephone Encounter (Signed)
[name] called, states pharmacy never received the Rx for the Ambien and pharmacy asked that we re send it. WIll ask Brittney NP to escribe it again.

## 2021-08-09 DIAGNOSIS — F339 Major depressive disorder, recurrent, unspecified: Secondary | ICD-10-CM | POA: Diagnosis not present

## 2021-08-09 DIAGNOSIS — F4010 Social phobia, unspecified: Secondary | ICD-10-CM | POA: Diagnosis not present

## 2021-08-19 DIAGNOSIS — F4010 Social phobia, unspecified: Secondary | ICD-10-CM | POA: Diagnosis not present

## 2021-08-19 DIAGNOSIS — F339 Major depressive disorder, recurrent, unspecified: Secondary | ICD-10-CM | POA: Diagnosis not present

## 2021-08-30 DIAGNOSIS — Z23 Encounter for immunization: Secondary | ICD-10-CM | POA: Diagnosis not present

## 2021-09-01 ENCOUNTER — Other Ambulatory Visit: Payer: Self-pay | Admitting: Medical-Surgical

## 2021-09-05 NOTE — Telephone Encounter (Signed)
Continuing Clonazepam taper as previously discussed.

## 2021-09-05 NOTE — Telephone Encounter (Signed)
Last written 06/09/2021 #45 with 2 refills Last appt 06/30/2021

## 2021-09-22 ENCOUNTER — Other Ambulatory Visit: Payer: Self-pay | Admitting: Osteopathic Medicine

## 2021-09-30 ENCOUNTER — Ambulatory Visit (INDEPENDENT_AMBULATORY_CARE_PROVIDER_SITE_OTHER): Payer: Medicare Other | Admitting: Medical-Surgical

## 2021-09-30 ENCOUNTER — Encounter: Payer: Self-pay | Admitting: Medical-Surgical

## 2021-09-30 VITALS — BP 112/62 | HR 83 | Resp 20 | Ht 62.0 in | Wt 163.1 lb

## 2021-09-30 DIAGNOSIS — I10 Essential (primary) hypertension: Secondary | ICD-10-CM | POA: Diagnosis not present

## 2021-09-30 DIAGNOSIS — F411 Generalized anxiety disorder: Secondary | ICD-10-CM | POA: Diagnosis not present

## 2021-09-30 DIAGNOSIS — F5101 Primary insomnia: Secondary | ICD-10-CM

## 2021-09-30 DIAGNOSIS — M79641 Pain in right hand: Secondary | ICD-10-CM

## 2021-09-30 DIAGNOSIS — L989 Disorder of the skin and subcutaneous tissue, unspecified: Secondary | ICD-10-CM | POA: Diagnosis not present

## 2021-09-30 DIAGNOSIS — M79642 Pain in left hand: Secondary | ICD-10-CM

## 2021-09-30 MED ORDER — CELECOXIB 100 MG PO CAPS: 100.0000 mg | ORAL_CAPSULE | Freq: Two times a day (BID) | ORAL | 0 refills | Status: DC

## 2021-09-30 NOTE — Progress Notes (Signed)
Established Patient Office Visit  Subjective   Patient ID: [name], female   DOB: [birth year] Age: 76 y.o. MRN: [id number]   Chief Complaint  Patient presents with   Arthritis   HPI Pleasant 76 year old female presenting today for the following:  Reports that she has had significant arthritis in her hands for years.  Years ago she did try medication for arthritis however this caused severe diarrhea and she stopped it.  She was able to manage with regular conservative efforts for the last few years however her hands have started to get worse and are starting to limit her ability to do regular everyday things.  She is not currently taking anything for arthritis but is interested in getting restarted on the medication.  She is unable to remember what medication she tried.  Mood: She does have quite a bit of anxiety and depression.  She is currently being followed at the mood treatment center by a psychologist as well and has a psychiatry NP.  They are managing her medications.  Notes that she has had chronic insomnia for most of her life and has had an issue with a flare of insomnia recently.  This is starting to improve however she is worried about the lack of mental clarity when she is sleep deprived.  Has several spots on her face that she is concerned about.  She has had a biopsy before and it came back showing it was an age spot in a separate area of her face.  Now she has 2 more spots on the right cheek that are no longer flat but a bit rough and very slightly raised.  The spots are asymptomatic however they are cosmetically bothersome.  She is interested in getting these evaluated further.   Objective:    Vitals:   09/30/21 1038  BP: 112/62  Pulse: 83  Resp: 20  Height: '5\' 2"'$  (1.575 m)  Weight: 163 lb 1.9 oz (74 kg)  SpO2: 99%  BMI (Calculated): 29.83    Physical Exam Vitals and nursing note reviewed.  Constitutional:      General: She is not in acute distress.     Appearance: Normal appearance. She is not ill-appearing.  HENT:     Head: Normocephalic and atraumatic.  Cardiovascular:     Rate and Rhythm: Normal rate and regular rhythm.     Pulses: Normal pulses.     Heart sounds: Normal heart sounds.  Pulmonary:     Effort: Pulmonary effort is normal. No respiratory distress.     Breath sounds: Normal breath sounds. No wheezing, rhonchi or rales.  Skin:    General: Skin is warm and dry.     Findings: Lesion (Right cheek) present.  Neurological:     Mental Status: She is alert and oriented to person, place, and time.  Psychiatric:        Mood and Affect: Mood normal.        Behavior: Behavior normal.        Thought Content: Thought content normal.        Judgment: Judgment normal.   No results found for this or any previous visit (from the past 24 hour(s)).     The 10-year ASCVD risk score (Arnett DK, et al., 2019) is: 33.2%   Values used to calculate the score:     Age: 23 years     Sex: Female     Is Non-Hispanic African American: No     Diabetic: Yes  Tobacco smoker: No     Systolic Blood Pressure: 003 mmHg     Is BP treated: Yes     HDL Cholesterol: 88 mg/dL     Total Cholesterol: 201 mg/dL   Assessment & Plan:   1. Facial lesion Unclear etiology.  Consider solar lentigo, seborrheic keratoses, or actinic keratosis.  With the prominent location on her face, hesitant to perform an in office biopsy or shave excision.  Referring to dermatology for further evaluation. - Ambulatory referral to Dermatology  2. Essential hypertension Blood pressure at goal today.  Continue Dyazide and Lotrel as prescribed.  3. Bilateral hand pain Exam consistent with hand pain that limits range of motion.  [place] nodes noted on most digits.  We will trial Celebrex 100 mg twice daily as needed but advised her to let us know immediately should she have any negative side effects.  4. Primary insomnia 5. Generalized anxiety disorder Managed by  psychiatry and psychology.  Return in about 6 weeks (around 11/11/2021) for arthritis follow up.  ___________________________________________ Clearnce Sorrel, DNP, APRN, FNP-BC Primary Care and [place]

## 2021-10-07 DIAGNOSIS — Z23 Encounter for immunization: Secondary | ICD-10-CM | POA: Diagnosis not present

## 2021-10-09 ENCOUNTER — Encounter: Payer: Self-pay | Admitting: Medical-Surgical

## 2021-10-16 ENCOUNTER — Other Ambulatory Visit: Payer: Self-pay | Admitting: Medical-Surgical

## 2021-10-19 ENCOUNTER — Telehealth (INDEPENDENT_AMBULATORY_CARE_PROVIDER_SITE_OTHER): Payer: Medicare Other | Admitting: Psychiatry

## 2021-10-19 ENCOUNTER — Encounter (HOSPITAL_COMMUNITY): Payer: Self-pay | Admitting: Psychiatry

## 2021-10-19 DIAGNOSIS — F331 Major depressive disorder, recurrent, moderate: Secondary | ICD-10-CM | POA: Diagnosis not present

## 2021-10-19 DIAGNOSIS — F418 Other specified anxiety disorders: Secondary | ICD-10-CM | POA: Diagnosis not present

## 2021-10-19 DIAGNOSIS — F5101 Primary insomnia: Secondary | ICD-10-CM | POA: Diagnosis not present

## 2021-10-19 MED ORDER — VENLAFAXINE HCL ER 150 MG PO CP24: 150.0000 mg | ORAL_CAPSULE | Freq: Every day | ORAL | 3 refills | Status: DC

## 2021-10-19 MED ORDER — ZOLPIDEM TARTRATE 5 MG PO TABS: 7.5000 mg | ORAL_TABLET | Freq: Every evening | ORAL | 2 refills | Status: DC | PRN

## 2021-10-19 NOTE — Progress Notes (Signed)
[facility] MD/PA/NP OP Progress Note  Virtual Visit via Telephone Note  I connected with [name] on [birth year] at 11:00 AM EDT by telephone and verified that I am speaking with the correct person using two identifiers.  Location: Patient: home Provider: Clinic   I discussed the limitations, risks, security and privacy concerns of performing an evaluation and management service by telephone and the availability of in person appointments. I also discussed with the patient that there may be a patient responsible charge related to this service. The patient expressed understanding and agreed to proceed.   I provided 30 minutes of non-face-to-face time during this encounter.     10/19/2021 7:41 AM [name]  MRN:  [id number]  Chief Complaint: "things are about the same I don't sleep well"    HPI: 76 year old female seen today for follow up psychiatric evaluation.   She has a psychiatric history of insomnia, anxiety, depression, and passive SI.  She is currently being managed on, Effexor 150 mg daily, Klonopin 0.5 twice daily (perscribed by PCP), and Ambien 7.5 mg nightly. She notes her medications are somewhat effective in managing her psychiatric conditions.    Today she was unable to login virtually so her assessment was done the phone.  During exam she is pleasant, cooperative, and engaged in conversation.  She informed Probation officer that things are about the same noting that she does not sleep well.  She informed Probation officer that she sleeps approximately 3 hours nightly.  Patient notes that she feels somewhat better than she did at her last visit.  Provider conducted a GAD-7 and patient scored a 16.  She informed Probation officer that she is worried about her sleep and her son who recently got a new job that is stressful.  Provider also conducted PHQ-9 and patient scored a 16.  She endorses adequate appetite.  Today she denies SI/HI/AVH, mania, paranoia.    No medication changes made today.  Patient  agreeable to continue medication as prescribed.  No other concerns at this time.        Visit Diagnosis:    ICD-10-CM   1. Anxiety associated with depression  F41.8 venlafaxine XR (EFFEXOR-XR) 150 MG 24 hr capsule    2. Moderate episode of recurrent major depressive disorder (HCC)  F33.1 venlafaxine XR (EFFEXOR-XR) 150 MG 24 hr capsule      Past Psychiatric History:  insomnia, anxiety, depression, and passive SI.  Past Medical History:  Past Medical History:  Diagnosis Date   Anxiety    Depression    Headache    SINUS   Hyperlipidemia    Hypertension    Osteopenia    Primary localized osteoarthritis of right knee     Past Surgical History:  Procedure Laterality Date   BREAST EXCISIONAL BIOPSY Right    benign cyst removal    CARPAL TUNNEL RELEASE Right 2015   Ortho in [place] Right 2011   Orthopedic in [place] Right 09/20/2015   TOTAL KNEE ARTHROPLASTY Right 09/20/2015   Procedure: TOTAL KNEE ARTHROPLASTY;  Surgeon: Elsie Saas, MD;  Location: [facility];  Service: Orthopedics;  Laterality: Right;   Trigger Thumb Right 2015   Done in [facility] at the same time as the carpal tunnel release   [place]    Family Psychiatric History: Mother anxiety, maternal aunts alcohol use, maternal aunt anorexia Family History:  Family History  Problem Relation Age of Onset  Heart failure Mother    Heart attack Mother    Alcohol abuse Maternal Aunt    Heart attack Father    Irregular heart beat Sister    Hypertension Sister    Heart disease Brother     Social History:  Social History   Socioeconomic History   Marital status: Married    Spouse name: Purcell Nails   Number of children: 1   Years of education: 17.5   Highest education level: Master's degree (e.g., MA, MS, MEng, MEd, MSW, MBA)  Occupational History   Occupation: retired    Comment: Licensed conveyancer  Tobacco Use   Smoking status: Never   Smokeless  tobacco: Never  Vaping Use   Vaping Use: Never used  Substance and Sexual Activity   Alcohol use: No    Comment: 1 glass a month   Drug use: No   Sexual activity: Yes  Other Topics Concern   Not on file  Social History Narrative   Reads paper, walks, housework errands. Caffeine use daily   Social Determinants of Health   Financial Resource Strain: Low Risk  (02/27/2020)   Overall Financial Resource Strain (CARDIA)    Difficulty of Paying Living Expenses: Not hard at all  Food Insecurity: No Food Insecurity (02/27/2020)   Hunger Vital Sign    Worried About Running Out of Food in the Last Year: Never true    Ran Out of Food in the Last Year: Never true  Transportation Needs: No Transportation Needs (02/27/2020)   PRAPARE - Hydrologist (Medical): No    Lack of Transportation (Non-Medical): No  Physical Activity: Inactive (02/27/2020)   Exercise Vital Sign    Days of Exercise per Week: 0 days    Minutes of Exercise per Session: 0 min  Stress: No Stress Concern Present (02/27/2020)   [place]    Feeling of Stress : Not at all  Social Connections: Moderately Isolated (02/27/2020)   Social Connection and Isolation Panel [NHANES]    Frequency of Communication with Friends and Family: Three times a week    Frequency of Social Gatherings with Friends and Family: Once a week    Attends Religious Services: Never    Marine scientist or Organizations: No    Attends Archivist Meetings: Never    Marital Status: Married    Allergies:  Allergies  Allergen Reactions   Penicillins Hives    Tolerates Cephalosporins    Sulfa Antibiotics Hives         Metabolic Disorder Labs: Lab Results  Component Value Date   HGBA1C 5.5 06/30/2021   MPG 131 01/17/2019   MPG 128 11/28/2017   No results found for: "PROLACTIN" Lab Results  Component Value Date   CHOL 201 (H) 06/30/2021    TRIG 95 06/30/2021   HDL 88 06/30/2021   CHOLHDL 2.3 06/30/2021   LDLCALC 94 06/30/2021   LDLCALC 77 06/30/2020   Lab Results  Component Value Date   TSH 4.01 01/08/2017   TSH 1.93 12/07/2015    Therapeutic Level Labs: No results found for: "LITHIUM" No results found for: "VALPROATE" No results found for: "CBMZ"  Current Medications: Current Outpatient Medications  Medication Sig Dispense Refill   amLODipine-benazepril (LOTREL) 5-20 MG capsule Take 1 capsule by mouth daily. 90 capsule 3   atorvastatin (LIPITOR) 20 MG tablet TAKE ONE TABLET BY MOUTH ONE TIME A DAY 90 tablet 3   celecoxib (CELEBREX)  100 MG capsule Take 1 capsule (100 mg total) by mouth 2 (two) times daily. 60 capsule 0   clonazePAM (KLONOPIN) 0.5 MG tablet TAKE 1/2 TO 1 TABLET BY MOUTH TWICE DAILY AS NEEDED **DO NOT TAKE WITH TEMAZEPAM** 40 tablet 2   fluticasone (FLONASE) 50 MCG/ACT nasal spray Place into both nostrils daily.     raloxifene (EVISTA) 60 MG tablet TAKE ONE TABLET BY MOUTH DAILY 90 tablet 0   triamterene-hydrochlorothiazide (DYAZIDE) 37.5-25 MG capsule TAKE ONE CAPSULE BY MOUTH ONE TIME A DAY 90 capsule 3   venlafaxine XR (EFFEXOR-XR) 150 MG 24 hr capsule Take 1 capsule (150 mg total) by mouth daily with breakfast. 90 capsule 3   zolpidem (AMBIEN) 5 MG tablet Take 1.5 tablets (7.5 mg total) by mouth at bedtime as needed for sleep. 45 tablet 2   No current facility-administered medications for this visit.     Musculoskeletal: Strength & Muscle Tone:  Unable to assess due to telephone visit ,  Gait & Station:  Unable to assess due to telephone visit ,  Patient leans: N/A  Psychiatric Specialty Exam: Review of Systems  There were no vitals taken for this visit.There is no height or weight on file to calculate BMI.  General Appearance:  Unable to assess due to telephone visit  Eye Contact:   Unable to assess due to telephone  Speech:  Clear and Coherent and Normal Rate  Volume:  Normal  Mood:   Anxious  Affect:  Appropriate and Congruent  Thought Process:  Coherent, Goal Directed and Linear  Orientation:  Full (Time, Place, and Person)  Thought Content: WDL and Logical   Suicidal Thoughts:  No  Homicidal Thoughts:  No  Memory:  Immediate;   Good Recent;   Good Remote;   Good  Judgement:  Good  Insight:  Good  Psychomotor Activity:   Unable to assess due to telephone visit  Concentration:  Concentration: Good and Attention Span: Good  Recall:  Good  Fund of Knowledge: Good  Language: Good  Akathisia:   Unable to assess due to telephone visit  Handed:  Right  AIMS (if indicated): Not done  Assets:  Communication Skills Desire for Improvement Financial Resources/Insurance Housing Social Support  ADL's:  Intact  Cognition: WNL  Sleep:  Poor   Screenings: GAD-7    Flowsheet Row Video Visit from 10/19/2021 in [facility] Office Visit from 09/30/2021 in [facility] Office Visit from 06/30/2021 in [facility] from 01/31/2021 in [facility] Video Visit from 10/27/2020 in [facility]  Total GAD-7 Score '16 8 14 4 9      '$ PHQ2-9    Flowsheet Row Video Visit from 10/19/2021 in [facility] Office Visit from 09/30/2021 in [facility] Visit from 06/30/2021 in [facility] from 01/31/2021 in [facility] Video Visit from 10/27/2020 in [facility]  PHQ-2 Total Score '4 3 4 1 2  '$ PHQ-9 Total Score '16 11 14 4 7      '$ Flowsheet Row Video Visit from 10/19/2021 in [facility] from 04/29/2020 in [facility] Error: Q7 should not be populated when Q6 is No  No Risk        Assessment  and Plan: Patient reports that her sleep continues to poor but overall is doing somewhat better. No medication changes made today.  Patient agreeable to continue medication as prescribed.  No other concerns at this time.  1. Anxiety associated with depression  Continue- venlafaxine XR (EFFEXOR-XR) 150 MG 24 hr capsule; Take 1 capsule (150 mg total) by mouth daily with breakfast.  Dispense: 90 capsule; Refill: 3  2. Moderate episode of recurrent major depressive disorder (HCC)  Continue- venlafaxine XR (EFFEXOR-XR) 150 MG 24 hr capsule; Take 1 capsule (150 mg total) by mouth daily with breakfast.  Dispense: 90 capsule; Refill: 3  3. Primary insomnia  Continue- zolpidem (AMBIEN) 5 MG tablet; Take 1.5 tablets (7.5 mg total) by mouth at bedtime as needed for sleep.  Dispense: 45 tablet; Refill: 2      Follow-up in 3 months   Salley Slaughter, NP 10/19/2021, 9:48 AM

## 2021-10-20 ENCOUNTER — Other Ambulatory Visit (HOSPITAL_COMMUNITY): Payer: Self-pay | Admitting: Psychiatry

## 2021-10-20 DIAGNOSIS — F5101 Primary insomnia: Secondary | ICD-10-CM

## 2021-10-29 ENCOUNTER — Other Ambulatory Visit: Payer: Self-pay | Admitting: Medical-Surgical

## 2021-11-08 DIAGNOSIS — C44319 Basal cell carcinoma of skin of other parts of face: Secondary | ICD-10-CM | POA: Diagnosis not present

## 2021-11-08 DIAGNOSIS — L821 Other seborrheic keratosis: Secondary | ICD-10-CM | POA: Diagnosis not present

## 2021-11-11 ENCOUNTER — Encounter: Payer: Self-pay | Admitting: Medical-Surgical

## 2021-11-11 ENCOUNTER — Ambulatory Visit (INDEPENDENT_AMBULATORY_CARE_PROVIDER_SITE_OTHER): Payer: Medicare Other | Admitting: Medical-Surgical

## 2021-11-11 VITALS — BP 128/65 | HR 72 | Resp 20 | Ht 62.0 in | Wt 172.7 lb

## 2021-11-11 DIAGNOSIS — Z23 Encounter for immunization: Secondary | ICD-10-CM | POA: Diagnosis not present

## 2021-11-11 DIAGNOSIS — R7303 Prediabetes: Secondary | ICD-10-CM

## 2021-11-11 DIAGNOSIS — M79641 Pain in right hand: Secondary | ICD-10-CM | POA: Diagnosis not present

## 2021-11-11 DIAGNOSIS — M79642 Pain in left hand: Secondary | ICD-10-CM | POA: Diagnosis not present

## 2021-11-11 LAB — POCT UA - MICROALBUMIN
Albumin/Creatinine Ratio, Urine, POC: <30
Creatinine, POC: 100 mg/dL
Microalbumin Ur, POC: 10 mg/L

## 2021-11-11 MED ORDER — CELECOXIB 200 MG PO CAPS: 200.0000 mg | ORAL_CAPSULE | Freq: Two times a day (BID) | ORAL | 1 refills | Status: DC

## 2021-11-11 NOTE — Progress Notes (Signed)
Established Patient Office Visit  Subjective   Patient ID: [name], female   DOB: March 03, 1945 Age: 76 y.o. MRN: [id number]   Chief Complaint  Patient presents with   Arthritis   HPI Pleasant 76 year old female presenting today for follow up on arthritis. Previous intolerance to NSAIDs due to GI issues so we trialed a low dose of Celebrex twice daily as needed. She has been taking this for about 6 weeks, tolerating well without side effects. Reports good improvement in her hand pain with the medication. Has had no GI side effects as she did in the past. Is quite happy with the pain reduction but notes that it could be a little better. Is possibly interested in trying a higher dose.    Objective:    Vitals:   11/11/21 1111  BP: 128/65  Pulse: 72  Resp: 20  Height: '5\' 2"'$  (1.575 m)  Weight: 172 lb 11.2 oz (78.3 kg)  SpO2: 100%  BMI (Calculated): 31.58    Physical Exam Vitals and nursing note reviewed.  Constitutional:      General: She is not in acute distress.    Appearance: Normal appearance. She is obese. She is not ill-appearing.  HENT:     Head: Normocephalic and atraumatic.  Cardiovascular:     Rate and Rhythm: Normal rate and regular rhythm.     Pulses: Normal pulses.     Heart sounds: Normal heart sounds.  Pulmonary:     Effort: Pulmonary effort is normal. No respiratory distress.     Breath sounds: Normal breath sounds. No wheezing, rhonchi or rales.  Skin:    General: Skin is warm and dry.  Neurological:     Mental Status: She is alert and oriented to person, place, and time.  Psychiatric:        Mood and Affect: Mood normal.        Behavior: Behavior normal.        Thought Content: Thought content normal.        Judgment: Judgment normal.    Results for orders placed or performed in visit on 11/11/21 (from the past 24 hour(s))  POCT UA - Microalbumin     Status: Normal   Collection Time: 11/11/21 11:14 AM  Result Value Ref Range    Microalbumin Ur, POC 10 mg/L   Creatinine, POC 100 mg/dL   Albumin/Creatinine Ratio, Urine, POC <30        The 10-year ASCVD risk score (Arnett DK, et al., 2019) is: 45.2%   Values used to calculate the score:     Age: 33 years     Sex: Female     Is Non-Hispanic African American: No     Diabetic: Yes     Tobacco smoker: No     Systolic Blood Pressure: 244 mmHg     Is BP treated: Yes     HDL Cholesterol: 88 mg/dL     Total Cholesterol: 201 mg/dL   Assessment & Plan:   1. Bilateral hand pain Improved on Celebrex but still having some pain. Increase Celebrex to '200mg'$  in the morning and '100mg'$  in the evening. If tolerating well but still having pain, increase to a maximum of '200mg'$  twice daily. Monitor for side effects or intolerance symptoms.   2. Prediabetes POCT microalbumin normal.  - POCT UA - Microalbumin  3. Need for influenza vaccination Completed at [facility]. Record updated.   Return for follow up as scheduled in November. ___________________________________________ Clearnce Sorrel, DNP, APRN,  FNP-BC [facility]

## 2021-11-28 ENCOUNTER — Other Ambulatory Visit: Payer: Self-pay | Admitting: Medical-Surgical

## 2021-11-28 DIAGNOSIS — Z23 Encounter for immunization: Secondary | ICD-10-CM | POA: Diagnosis not present

## 2021-12-05 ENCOUNTER — Encounter: Payer: Self-pay | Admitting: Family Medicine

## 2021-12-05 ENCOUNTER — Ambulatory Visit (INDEPENDENT_AMBULATORY_CARE_PROVIDER_SITE_OTHER): Payer: Medicare Other | Admitting: Family Medicine

## 2021-12-05 VITALS — BP 131/60 | HR 71 | Ht 62.0 in | Wt 172.0 lb

## 2021-12-05 DIAGNOSIS — B9689 Other specified bacterial agents as the cause of diseases classified elsewhere: Secondary | ICD-10-CM | POA: Insufficient documentation

## 2021-12-05 DIAGNOSIS — J029 Acute pharyngitis, unspecified: Secondary | ICD-10-CM | POA: Diagnosis not present

## 2021-12-05 DIAGNOSIS — J019 Acute sinusitis, unspecified: Secondary | ICD-10-CM | POA: Diagnosis not present

## 2021-12-05 LAB — POCT INFLUENZA A/B
Influenza A, POC: NEGATIVE
Influenza B, POC: NEGATIVE

## 2021-12-05 LAB — POC COVID19 BINAXNOW: SARS Coronavirus 2 Ag: NEGATIVE

## 2021-12-05 MED ORDER — DOXYCYCLINE HYCLATE 100 MG PO TABS: 100.0000 mg | ORAL_TABLET | Freq: Two times a day (BID) | ORAL | 0 refills | Status: AC

## 2021-12-05 NOTE — Assessment & Plan Note (Signed)
-   covid and flu both negative

## 2021-12-05 NOTE — Assessment & Plan Note (Signed)
-   pleasant 76 yo female with sinus infection. Will treat with doxycycline to help decrease inflammation. Recommended she take with food - if no better or she starts to develop fevers, cough to RTC for cxr

## 2021-12-05 NOTE — Progress Notes (Signed)
Acute Office Visit  Subjective:     Patient ID: [name], female    DOB: [birth year], 76 y.o.   MRN: [id number]  Chief Complaint  Patient presents with   Sinus Problem    HPI Patient is in today for sinus congestion. She has had symptoms for over two weeks. She admits to pressure in her ears, around her eyes and headaches. Denies cough.   Review of Systems  Constitutional:  Negative for chills and fever.  HENT:  Positive for congestion, sinus pain and sore throat.   Respiratory:  Negative for cough and shortness of breath.   Cardiovascular:  Negative for chest pain.  Neurological:  Negative for headaches.        Objective:    BP 131/60   Pulse 71   Ht '5\' 2"'$  (1.575 m)   Wt 172 lb (78 kg)   SpO2 99%   BMI 31.46 kg/m    Physical Exam Vitals and nursing note reviewed.  Constitutional:      General: She is not in acute distress.    Appearance: Normal appearance.  HENT:     Head: Normocephalic and atraumatic.     Comments: Tenderness to palpation of frontal and maxillary sinuses bilaterally    Right Ear: Ear canal and external ear normal.     Left Ear: Ear canal and external ear normal.     Ears:     Comments: Fluid noted on both ears    Nose: Nose normal.     Mouth/Throat:     Pharynx: Posterior oropharyngeal erythema present.  Eyes:     Conjunctiva/sclera: Conjunctivae normal.  Cardiovascular:     Rate and Rhythm: Normal rate and regular rhythm.  Pulmonary:     Effort: Pulmonary effort is normal.     Breath sounds: Normal breath sounds.  Neurological:     General: No focal deficit present.     Mental Status: She is alert and oriented to person, place, and time.  Psychiatric:        Mood and Affect: Mood normal.        Behavior: Behavior normal.        Thought Content: Thought content normal.        Judgment: Judgment normal.     Results for orders placed or performed in visit on 12/05/21  POC COVID-19  Result Value Ref Range   SARS  Coronavirus 2 Ag Negative Negative  POCT Influenza A/B  Result Value Ref Range   Influenza A, POC Negative Negative   Influenza B, POC Negative Negative        Assessment & Plan:   Problem List Items Addressed This Visit       Respiratory   Acute bacterial sinusitis - Primary    - pleasant 76 yo female with sinus infection. Will treat with doxycycline to help decrease inflammation. Recommended she take with food - if no better or she starts to develop fevers, cough to RTC for cxr        Relevant Medications   doxycycline (VIBRA-TABS) 100 MG tablet   Viral pharyngitis    - covid and flu both negative      Relevant Orders   POC COVID-19 (Completed)   POCT Influenza A/B (Completed)    Meds ordered this encounter  Medications   doxycycline (VIBRA-TABS) 100 MG tablet    Sig: Take 1 tablet (100 mg total) by mouth 2 (two) times daily for 7 days.    Dispense:  14 tablet    Refill:  0    Return if symptoms worsen or fail to improve.  Owens Loffler, DO

## 2021-12-05 NOTE — Patient Instructions (Signed)
Rest! Rest! Rest!   Increase fluids   Try to get vit c, d and zinc to help boost your immune system   Take your medicine with food  Use nasal spray and do the cross treatment technique

## 2021-12-25 ENCOUNTER — Ambulatory Visit
Admission: RE | Admit: 2021-12-25 | Discharge: 2021-12-25 | Disposition: A | Discharge status: Home / Self Care | Payer: Medicare Other | Source: Ambulatory Visit | Attending: Family Medicine | Admitting: Family Medicine

## 2021-12-25 VITALS — BP 146/71 | HR 71 | Temp 99.3°F | Resp 18

## 2021-12-25 DIAGNOSIS — J0140 Acute pansinusitis, unspecified: Secondary | ICD-10-CM

## 2021-12-25 MED ORDER — CEFDINIR 300 MG PO CAPS: 300.0000 mg | ORAL_CAPSULE | Freq: Two times a day (BID) | ORAL | 0 refills | Status: DC

## 2021-12-25 MED ORDER — PREDNISONE 20 MG PO TABS: 20.0000 mg | ORAL_TABLET | Freq: Two times a day (BID) | ORAL | 0 refills | Status: DC

## 2021-12-25 NOTE — ED Triage Notes (Signed)
Patient c/o body chills, body aches, head congestion, bilateral ear discomfort, scratchy eyes and throat x 5 days.  Patient has taken OTC cold meds.

## 2021-12-25 NOTE — Discharge Instructions (Signed)
Make sure you are drinking lots of water Run a humidifier if you have 1 available Take prednisone 2 times a day to help reduce the swelling and inflammation in your sinuses Take antibiotic cefdinir 2 times a day for 10 days See your doctor if you fail to improve

## 2021-12-25 NOTE — ED Provider Notes (Signed)
[facility]    CSN: [id number] Arrival date & time: 12/25/21  1042      History   Chief Complaint Chief Complaint  Patient presents with   Influenza    Had sinus infection several weeks ago.  Got antibiotics.  Got well.  Now have full head, chills, slight fever, scratchy eyes and throat, body aches, queesiness, some difficulty hearing., - Entered by patient    HPI [name] is a 76 y.o. female.   HPI Patient was treated for sinusitis a couple of weeks ago.  She was given a week of doxycycline.  She thinks she felt slightly better on the doxycycline but her symptoms come back.  She states she is currently been sick for 2 to 3 weeks.  She states she has recurring sinus problems.  Pressure and pain in her sinuses with some postnasal drip, some cough.  She states she feels very tired.  She is having some body aches.  She has had some feeling hot and cold but has not taken her temperature.  Does not think she has had a fever.  Past Medical History:  Diagnosis Date   Anxiety    Depression    Headache    SINUS   Hyperlipidemia    Hypertension    Osteopenia    Primary localized osteoarthritis of right knee     Patient Active Problem List   Diagnosis Date Noted   Facial lesion 09/30/2021   Bilateral hand pain 09/30/2021   Impingement syndrome, shoulder, left 02/16/2021   Impingement syndrome, shoulder, right 09/07/2020   Moderate episode of recurrent major depressive disorder ([place]) 09/02/2019   Generalized anxiety disorder 09/02/2019   Primary insomnia 08/16/2019   Post-menopausal 05/14/2017   Prediabetes 01/12/2017   Snoring 07/20/2016   Obsessive-compulsive personality trait 12/07/2015   Primary localized osteoarthritis of right knee    Essential hypertension 08/04/2015   Hyperlipidemia 08/04/2015   Osteopenia 08/04/2015   Anxiety associated with depression 08/04/2015    Past Surgical History:  Procedure Laterality Date   BREAST EXCISIONAL  BIOPSY Right    benign cyst removal    CARPAL TUNNEL RELEASE Right 2015   Ortho in [place] Right 2011   Orthopedic in [place] Right 09/20/2015   TOTAL KNEE ARTHROPLASTY Right 09/20/2015   Procedure: TOTAL KNEE ARTHROPLASTY;  Surgeon: Elsie Saas, MD;  Location: [facility];  Service: Orthopedics;  Laterality: Right;   Trigger Thumb Right 2015   Done in [place] at the same time as the carpal tunnel release   TUBAL LIGATION  1980    OB History   No obstetric history on file.      Home Medications    Prior to Admission medications   Medication Sig Start Date End Date Taking? Authorizing Provider  amLODipine-benazepril (LOTREL) 5-20 MG capsule Take 1 capsule by mouth daily. 06/30/21  Yes Jessup, Caryl Asp, NP  atorvastatin (LIPITOR) 20 MG tablet TAKE ONE TABLET BY MOUTH ONE TIME A DAY 06/30/21  Yes Samuel Bouche, NP  cefdinir (OMNICEF) 300 MG capsule Take 1 capsule (300 mg total) by mouth 2 (two) times daily. 12/25/21  Yes Raylene Everts, MD  celecoxib (CELEBREX) 200 MG capsule Take 1 capsule (200 mg total) by mouth 2 (two) times daily. 11/11/21  Yes Jessup, Joy, NP  clonazePAM (KLONOPIN) 0.5 MG tablet TAKE 1/2 TO 1 TABLET BY MOUTH TWICE DAILY AS NEEDED **DO NOT TAKE WITH TEMAZEPAM**  11/28/21  Yes Samuel Bouche, NP  fluticasone (FLONASE) 50 MCG/ACT nasal spray Place into both nostrils daily.   Yes [provider]  predniSONE (DELTASONE) 20 MG tablet Take 1 tablet (20 mg total) by mouth 2 (two) times daily with a meal. 12/25/21  Yes Raylene Everts, MD  raloxifene (EVISTA) 60 MG tablet TAKE 1 TABLET BY MOUTH DAILY 10/21/21  Yes Samuel Bouche, NP  triamterene-hydrochlorothiazide (DYAZIDE) 37.5-25 MG capsule TAKE ONE CAPSULE BY MOUTH ONE TIME A DAY 06/30/21  Yes Samuel Bouche, NP  venlafaxine XR (EFFEXOR-XR) 150 MG 24 hr capsule Take 1 capsule (150 mg total) by mouth daily with breakfast. 10/19/21  Yes Eulis Canner E, NP  zolpidem (AMBIEN) 5  MG tablet TAKE 1 AND 1/2 TABLET BY MOUTH EVERY NIGHT AT BEDTIME AS NEEDED FOR SLEEP 10/20/21  Yes Salley [facility], NP    Family History Family History  Problem Relation Age of Onset   Heart failure Mother    Heart attack Mother    Heart attack Father    Irregular heart beat Sister    Hypertension Sister    Heart disease Brother    Alcohol abuse Maternal Aunt     Social History Social History   Tobacco Use   Smoking status: Never   Smokeless tobacco: Never  Vaping Use   Vaping Use: Never used  Substance Use Topics   Alcohol use: No    Comment: 1 glass a month   Drug use: No     Allergies   Penicillins and Sulfa antibiotics   Review of Systems Review of Systems See HPI  Physical Exam Triage Vital Signs ED Triage Vitals  Enc Vitals Group     BP 12/25/21 1119 (!) 146/71     Pulse Rate 12/25/21 1119 71     Resp 12/25/21 1119 18     Temp 12/25/21 1119 99.3 F (37.4 C)     Temp Source 12/25/21 1119 Oral     SpO2 12/25/21 1119 100 %     Weight --      Height --      Head Circumference --      Peak Flow --      Pain Score 12/25/21 1120 3     Pain Loc --      Pain Edu? --      Excl. in [place]? --    No data found.  Updated Vital Signs BP (!) 146/71 (BP Location: Left Arm)   Pulse 71   Temp 99.3 F (37.4 C) (Oral)   Resp 18   SpO2 100%      Physical Exam Constitutional:      General: She is not in acute distress.    Appearance: She is well-developed and normal weight.  HENT:     Head: Normocephalic and atraumatic.     Right Ear: Tympanic membrane and ear canal normal.     Left Ear: Tympanic membrane and ear canal normal.     Nose: Congestion present. No rhinorrhea.     Comments: Some membranes are swollen and red.  There is tenderness over the facial sinuses maxillary ethmoid and frontal    Mouth/Throat:     Pharynx: No posterior oropharyngeal erythema.  Eyes:     Conjunctiva/sclera: Conjunctivae normal.     Pupils: Pupils are equal, round, and  reactive to light.  Cardiovascular:     Rate and Rhythm: Normal rate and regular rhythm.     Heart sounds: Murmur heard.  Pulmonary:  Effort: Pulmonary effort is normal. No respiratory distress.     Breath sounds: No wheezing or rhonchi.  Abdominal:     General: There is no distension.     Palpations: Abdomen is soft.  Musculoskeletal:        General: Normal range of motion.     Cervical back: Normal range of motion.  Lymphadenopathy:     Cervical: No cervical adenopathy.  Skin:    General: Skin is warm and dry.  Neurological:     Mental Status: She is alert.  Psychiatric:        Mood and Affect: Mood normal.        Behavior: Behavior normal.      UC Treatments / Results  Labs (all labs ordered are listed, but only abnormal results are displayed) Labs Reviewed - No data to display  EKG   Radiology No results found.  Procedures Procedures (including critical care time)  Medications Ordered in UC Medications - No data to display  Initial Impression / Assessment and Plan / UC Course  I have reviewed the triage vital signs and the nursing notes.  Pertinent labs & imaging results that were available during my care of the patient were reviewed by me and considered in my medical decision making (see chart for details).    Patient is still having symptoms of sinus infection after 3 weeks.  Treatment with a week of doxycycline did not clear her symptoms.  She is allergic to penicillins and sulfa.  We will give her cefdinir for 10 days.  Follow-up with primary care Final Clinical Impressions(s) / UC Diagnoses   Final diagnoses:  Subacute pansinusitis     Discharge Instructions      Make sure you are drinking lots of water Run a humidifier if you have 1 available Take prednisone 2 times a day to help reduce the swelling and inflammation in your sinuses Take antibiotic cefdinir 2 times a day for 10 days See your doctor if you fail to improve   ED  Prescriptions     Medication Sig Dispense Auth. Provider   cefdinir (OMNICEF) 300 MG capsule Take 1 capsule (300 mg total) by mouth 2 (two) times daily. 20 capsule Raylene Everts, MD   predniSONE (DELTASONE) 20 MG tablet Take 1 tablet (20 mg total) by mouth 2 (two) times daily with a meal. 10 tablet Raylene Everts, MD      PDMP not reviewed this encounter.   Raylene Everts, MD 12/25/21 [phone]

## 2021-12-26 ENCOUNTER — Encounter: Payer: Self-pay | Admitting: Medical-Surgical

## 2021-12-26 ENCOUNTER — Telehealth: Payer: Self-pay

## 2021-12-26 NOTE — Telephone Encounter (Signed)
TCT pt to follow up from recent visit. HIPAA compliant VM left for return call.  

## 2021-12-30 ENCOUNTER — Other Ambulatory Visit: Payer: Self-pay | Admitting: Medical-Surgical

## 2022-01-01 NOTE — Progress Notes (Unsigned)
[facility] MD Outpatient Progress Note  01/02/2022 5:27 PM [name]  MRN:  [id number]  Assessment:  [name] presents for follow-up evaluation. Today, 01/02/22, patient reports stability of mood and anxiety although continues to experience chronic insomnia. She identifies benefit from Ambien although sleep remains sub-optimal; she has undergone many medication trials for management of sleep. She has never participated in CBT-i and she was amenable to being provided with resources for potential therapists as well as retrial of melatonin but in extended release formulation. Counseled on risks of concurrent Ambien and Klonopin use; she states Klonopin is currently being slowly tapered by PCP and agree with ongoing taper and ultimate discontinuation of this medication. Will continue to explore other supports for sleep that may aid in eventual taper/discontinuation of Ambien as well given noted risks.   Plan to RTC in 2 months.  Identifying Information: [name] is a 76 y.o. female with a history of MDD, anxiety, arthritis, HTN, and prediabetes who is an established patient with [facility] participating in follow-up via video conferencing.   Plan:  # MDD  Anxiety Past medication trials: Remeron (nausea, diarrhea, oversedation) Status of problem: stable Interventions: -- Continue Effexor 150 mg daily -- Patient prescribed Klonopin 0.25-0.5 BID PRN by PCP (reports using 1-2 times weekly and encouraged to reduce use as able especially given concurrent Ambien use)  # Insomnia Past medication trials: Restoril, Klonopin, Remeron (nausea, diarrhea, oversedation), doxepin (ineffective), trazodone (nausea, diarrhea, dizziness) Status of problem: chronic Interventions: -- Continue Ambien 7.5 mg nightly   -- Risks, benefits, and side effects including but not limited to drowsiness and daytime sedation, dizziness, decreased mental alertness, falls and  related injuries, increased risk for dementia was discussed  -- START melatonin 3-5 mg extended release qEvening -- Recommend CBT for insomnia; provided with resources for CBT-i therapists in [place] -- Sleep hygiene reviewed -- Sleep study 05/10/20: mildest OSA; CPAP not recommended; could consider dental device  Patient was given contact information for behavioral health clinic and was instructed to call 911 for emergencies.   Subjective:  Chief Complaint:  Chief Complaint  Patient presents with   Medication Management    Interval History:   Patient was seen by Eulis Canner, NP on 10/19/2021.  At that time, managed on: Effexor 150 mg daily Ambien 7.5 mg nightly  Klonopin 0.5 BID (rx by PCP) During that visit, patient reported ongoing poor sleep.  No changes to medications were made at that time.  Today, patient reports she is recovering from a sinus infection and is finishing a prednisone taper. She states she continues to have trouble with insomnia although feels this will be a chronic problem for her - typically getting 4-6 hours nightly. Feels Ambien has provided benefit and would like to remain on this medication. Denies dizziness/lightheadedness, morning sedation, or falls.   Continues to take Effexor daily and tolerating well. Has been on for around 10 years. Feels mood is well controlled and denies feeling depressed. Denies SI. Feels anxiety is overall manageable. Spends her days visiting with family, reading a lot, going to plays.   Rx Klonopin by PCP - takes as needed about 1-2 times per week. States this is gradually being tapered and tolerating well. Discussed risks of Klonopin and Ambien use especially when used concurrently - she feels Ambien is more helpful than Klonopin and wants to prioritize coming down/off this medication prior to Ambien.  Has not participated in CBT-i and amenable to resources. Has tried  melatonin without benefit but is open to trialing extended  release version.   PDMP: -- Zolpidem 5 mg QTY 45 last filled 12/16/21 (regular rx dating back to Jan 2023) -- Klonopin 0.5 mg QTY 40 last filled 12/01/21 (regular rx dating back to Nov 2021)  Visit Diagnosis:    ICD-10-CM   1. Primary insomnia  F51.01 zolpidem (AMBIEN) 5 MG tablet    2. Recurrent major depressive disorder, remission status unspecified ([place])  F33.9     3. Generalized anxiety disorder  F41.1       Past Psychiatric History:  Diagnoses: MDD with anxiety Medication trials: Restoril, Remeron (nausea, diarrhea, oversedation), doxepin (ineffective), trazodone (nausea, diarrhea, dizziness), melatonin Suicide attempts: denies Substance use: denies tobacco or illicit drug use; etoh: 1 glass/month  Past Medical History:  Past Medical History:  Diagnosis Date   Anxiety    Depression    Headache    SINUS   Hyperlipidemia    Hypertension    Osteopenia    Primary localized osteoarthritis of right knee     Past Surgical History:  Procedure Laterality Date   BREAST EXCISIONAL BIOPSY Right    benign cyst removal    CARPAL TUNNEL RELEASE Right 2015   Ortho in [place] Right 2011   Orthopedic in [place] Right 09/20/2015   TOTAL KNEE ARTHROPLASTY Right 09/20/2015   Procedure: TOTAL KNEE ARTHROPLASTY;  Surgeon: Elsie Saas, MD;  Location: [facility];  Service: Orthopedics;  Laterality: Right;   Trigger Thumb Right 2015   Done in [facility] at the same time as the carpal tunnel release   [place]    Family Psychiatric History:  [place]: anxiety Maternal aunt: alcohol use, anorexia   Family History:  Family History  Problem Relation Age of Onset   Heart failure Mother    Heart attack Mother    Heart attack Father    Irregular heart beat Sister    Hypertension Sister    Heart disease Brother    Alcohol abuse Maternal Aunt     Social History:  Social History   Socioeconomic History   Marital status:  Married    Spouse name: [name]   Number of children: 1   Years of education: 17.5   Highest education level: Master's degree (e.g., MA, MS, MEng, MEd, MSW, MBA)  Occupational History   Occupation: retired    Comment: Licensed conveyancer  Tobacco Use   Smoking status: Never   Smokeless tobacco: Never  Vaping Use   Vaping Use: Never used  Substance and Sexual Activity   Alcohol use: No    Comment: 1 glass a month   Drug use: No   Sexual activity: Yes  Other Topics Concern   Not on file  Social History Narrative   Reads paper, walks, housework errands. Caffeine use daily   Social Determinants of Health   Financial Resource Strain: Low Risk  (02/27/2020)   Overall Financial Resource Strain (CARDIA)    Difficulty of Paying Living Expenses: Not hard at all  Food Insecurity: No Food Insecurity (02/27/2020)   Hunger Vital Sign    Worried About Running Out of Food in the Last Year: Never true    Ran Out of Food in the Last Year: Never true  Transportation Needs: No Transportation Needs (02/27/2020)   PRAPARE - Hydrologist (Medical): No    Lack of Transportation (Non-Medical): No  Physical  Activity: Inactive (02/27/2020)   Exercise Vital Sign    Days of Exercise per Week: 0 days    Minutes of Exercise per Session: 0 min  Stress: No Stress Concern Present (02/27/2020)   [place]    Feeling of Stress : Not at all  Social Connections: Moderately Isolated (02/27/2020)   Social Connection and Isolation Panel [NHANES]    Frequency of Communication with Friends and Family: Three times a week    Frequency of Social Gatherings with Friends and Family: Once a week    Attends Religious Services: Never    Marine scientist or Organizations: No    Attends Archivist Meetings: Never    Marital Status: Married    Allergies:  Allergies  Allergen Reactions   Penicillins Hives    Tolerates  Cephalosporins    Prednisone Other (See Comments)    Hyperactivity, sleep disturbance, confusion   Sulfa Antibiotics Hives         Current Medications: Current Outpatient Medications  Medication Sig Dispense Refill   clonazePAM (KLONOPIN) 0.5 MG tablet TAKE 1/2 TO 1 TABLET BY MOUTH TWO TIMES A DAY AS NEEDED 35 tablet 1   venlafaxine XR (EFFEXOR-XR) 150 MG 24 hr capsule Take 1 capsule (150 mg total) by mouth daily with breakfast. 90 capsule 3   amLODipine-benazepril (LOTREL) 5-20 MG capsule Take 1 capsule by mouth daily. 90 capsule 3   atorvastatin (LIPITOR) 20 MG tablet TAKE ONE TABLET BY MOUTH ONE TIME A DAY 90 tablet 3   cefdinir (OMNICEF) 300 MG capsule Take 1 capsule (300 mg total) by mouth 2 (two) times daily. 20 capsule 0   celecoxib (CELEBREX) 200 MG capsule Take 1 capsule (200 mg total) by mouth 2 (two) times daily. 180 capsule 1   fluticasone (FLONASE) 50 MCG/ACT nasal spray Place into both nostrils daily.     predniSONE (DELTASONE) 20 MG tablet Take 1 tablet (20 mg total) by mouth 2 (two) times daily with a meal. 10 tablet 0   raloxifene (EVISTA) 60 MG tablet TAKE 1 TABLET BY MOUTH DAILY 90 tablet 0   triamterene-hydrochlorothiazide (DYAZIDE) 37.5-25 MG capsule TAKE ONE CAPSULE BY MOUTH ONE TIME A DAY 90 capsule 3   [START ON 01/15/2022] zolpidem (AMBIEN) 5 MG tablet Take 1.5 tablets (7.5 mg total) by mouth at bedtime as needed for sleep. 45 tablet 1   No current facility-administered medications for this visit.    ROS: Recovering from sinus infection  Objective:  Psychiatric Specialty Exam: There were no vitals taken for this visit.There is no height or weight on file to calculate BMI.  General Appearance: Casual and Well Groomed  Eye Contact:  Good  Speech:  Clear and Coherent and Normal Rate  Volume:  Normal  Mood:   "good"  Affect:   Euthymic  Thought Content:  Denies AVH; IOR; paranoia    Suicidal Thoughts:  No  Homicidal Thoughts:  No  Thought Process:  Goal  Directed and Linear  Orientation:  Full (Time, Place, and Person)    Memory:   Grossly intact  Judgment:  Good  Insight:  Good  Concentration:  Concentration: Good  Recall:  NA  Fund of Knowledge: Good  Language: Good  Psychomotor Activity:  Normal  Akathisia:  No  AIMS (if indicated): not done  Assets:  Communication Skills Desire for Improvement Financial Resources/Insurance Housing Intimacy Leisure Time Physical Health Resilience Social Support Talents/Skills Transportation  ADL's:  Intact  Cognition: WNL  Sleep:   Chronically disrupted   PE: General: sits comfortably in view of camera; no acute distress  Pulm: no increased work of breathing on room air  MSK: all extremity movements appear intact  Neuro: no focal neurological deficits observed  Gait & Station: unable to assess by video    Metabolic Disorder Labs: Lab Results  Component Value Date   HGBA1C 5.5 06/30/2021   MPG 131 01/17/2019   MPG 128 11/28/2017   No results found for: "PROLACTIN" Lab Results  Component Value Date   CHOL 201 (H) 06/30/2021   TRIG 95 06/30/2021   HDL 88 06/30/2021   CHOLHDL 2.3 06/30/2021   LDLCALC 94 06/30/2021   LDLCALC 77 06/30/2020   Lab Results  Component Value Date   TSH 4.01 01/08/2017   TSH 1.93 12/07/2015    Therapeutic Level Labs: No results found for: "LITHIUM" No results found for: "VALPROATE" No results found for: "CBMZ"  Screenings:  GAD-7    Flowsheet Row Office Visit from 11/11/2021 in [facility] Video Visit from 10/19/2021 in [facility] Office Visit from 09/30/2021 in [facility] Office Visit from 06/30/2021 in [facility] from 01/31/2021 in [facility]  Total GAD-7 Score '6 16 8 14 4      '$ PHQ2-9    [facility] Office Visit from 11/11/2021 in [facility] Video Visit from 10/19/2021 in [facility] Office Visit from 09/30/2021 in [facility] Office Visit from 06/30/2021 in [facility] from 01/31/2021 in [facility]  PHQ-2 Total Score '2 4 3 4 1  '$ PHQ-9 Total Score '11 16 11 14 4      '$ [facility] from 12/25/2021 in [facility] Video Visit from 10/19/2021 in [facility] from 04/29/2020 in [facility] No Risk Error: Q7 should not be populated when Q6 is No No Risk       Collaboration of Care: Collaboration of Care: Medication Management AEB ongoing medication management and Psychiatrist AEB established with this provider  Patient/Guardian was advised Release of Information must be obtained prior to any record release in order to collaborate their care with an outside provider. Patient/Guardian was advised if they have not already done so to contact the registration department to sign all necessary forms in order for [place] to release information regarding their care.   Consent: Patient/Guardian gives verbal consent for treatment and assignment of benefits for services provided during this visit. Patient/Guardian expressed understanding and agreed to proceed.   Televisit via video: I connected with patient on 01/02/22 at 10:30 AM EST by a video enabled telemedicine application and verified that I am speaking with the correct person using two identifiers.  Location: Patient: home address in [place] Provider: Remote office in [place]   I discussed the limitations of evaluation and management by telemedicine and the availability of in person appointments. The patient expressed understanding and agreed to proceed.  I discussed the assessment and treatment plan with the patient.  The patient was provided an opportunity to ask questions and all were answered. The patient agreed with the plan and demonstrated an understanding of the instructions.   The patient  was advised to call back or seek an in-person evaluation if the symptoms worsen or if the condition fails to improve as anticipated.  I provided 45 minutes of non-face-to-face time during this encounter.  Breindel Collier A  01/02/2022, 1:28 PM

## 2022-01-01 NOTE — Patient Instructions (Signed)
Thank you for attending your appointment today.  -- START melatonin extended release 3-5 mg every evening (to be taken 1-2 hours before anticipated bedtime) -- Continue to reduce Klonopin use as able -- Continue other medications as prescribed. -- Here is a Chief Technology Officer for local therapists that offer CBT for insomnia (CBT-i):  https://www.psychologytoday.com/us/therapists/[place]?category=sleep-or-insomnia  Here is more information about CBT-i:  https://www.psychologytoday.com/us/blog/thinking-about-becoming-a-psychologist/202308/the-efficacy-of-cognitive-behavioral-therapy-for  CBT-i coach is an app you can download as well.  Please do not make any changes to medications without first discussing with your provider. If you are experiencing a psychiatric emergency, please call 911 or present to your nearest emergency department. Additional crisis, medication management, and therapy resources are included below.  [facility]  [address], [place], [place] [address] [phone] WALK-IN URGENT CARE 24/7 FOR ANYONE [address] [place], [place]  [phone] Fax: [phone] [email or web address] *Interpreters available *Accepts all insurance and uninsured for Urgent Care needs *Accepts Medicaid and uninsured for outpatient treatment (below)      ONLY FOR [facility]  Below:    Outpatient New Patient Assessment/Therapy Walk-ins:        Monday -Thursday 8am until slots are full.        Every Friday 1pm-4pm  (first come, first served)                   New Patient Psychiatry/Medication Management        Monday-Friday 8am-11am (first come, first served)               For all walk-ins we ask that you arrive by 7:15am, because patients will be seen in the order of arrival.

## 2022-01-02 ENCOUNTER — Telehealth (INDEPENDENT_AMBULATORY_CARE_PROVIDER_SITE_OTHER): Payer: Medicare Other | Admitting: Psychiatry

## 2022-01-02 ENCOUNTER — Ambulatory Visit: Payer: Medicare Other | Admitting: Medical-Surgical

## 2022-01-02 ENCOUNTER — Encounter (HOSPITAL_COMMUNITY): Payer: Self-pay | Admitting: Psychiatry

## 2022-01-02 ENCOUNTER — Encounter: Payer: Self-pay | Admitting: Medical-Surgical

## 2022-01-02 DIAGNOSIS — F339 Major depressive disorder, recurrent, unspecified: Secondary | ICD-10-CM | POA: Diagnosis not present

## 2022-01-02 DIAGNOSIS — F5101 Primary insomnia: Secondary | ICD-10-CM | POA: Diagnosis not present

## 2022-01-02 DIAGNOSIS — F411 Generalized anxiety disorder: Secondary | ICD-10-CM | POA: Diagnosis not present

## 2022-01-02 DIAGNOSIS — J329 Chronic sinusitis, unspecified: Secondary | ICD-10-CM

## 2022-01-02 MED ORDER — ZOLPIDEM TARTRATE 5 MG PO TABS: 7.5000 mg | ORAL_TABLET | Freq: Every evening | ORAL | 1 refills | Status: DC | PRN

## 2022-01-12 ENCOUNTER — Other Ambulatory Visit: Payer: Self-pay | Admitting: Medical-Surgical

## 2022-01-12 DIAGNOSIS — Z1231 Encounter for screening mammogram for malignant neoplasm of breast: Secondary | ICD-10-CM

## 2022-01-16 ENCOUNTER — Telehealth (HOSPITAL_COMMUNITY): Payer: Self-pay

## 2022-01-16 NOTE — Telephone Encounter (Signed)
Appointment - Patient left a message she needs to schedule her next appointment with Dr. Sande Rives or covering provider, last seen 01/02/22 with plans to return in 2 months.

## 2022-01-17 ENCOUNTER — Encounter: Payer: Self-pay | Admitting: Medical-Surgical

## 2022-01-17 ENCOUNTER — Ambulatory Visit (INDEPENDENT_AMBULATORY_CARE_PROVIDER_SITE_OTHER): Payer: Medicare Other | Admitting: Medical-Surgical

## 2022-01-17 VITALS — BP 122/72 | HR 103 | Resp 20 | Ht 62.0 in | Wt 173.5 lb

## 2022-01-17 DIAGNOSIS — I10 Essential (primary) hypertension: Secondary | ICD-10-CM

## 2022-01-17 DIAGNOSIS — M79642 Pain in left hand: Secondary | ICD-10-CM | POA: Diagnosis not present

## 2022-01-17 DIAGNOSIS — F5101 Primary insomnia: Secondary | ICD-10-CM

## 2022-01-17 DIAGNOSIS — M79641 Pain in right hand: Secondary | ICD-10-CM

## 2022-01-17 NOTE — Progress Notes (Signed)
Established Patient Office Visit  Subjective   Patient ID: [name], female   DOB: May 05, 1945 Age: 76 y.o. MRN: [id number]   Chief Complaint  Patient presents with   Insomnia   Follow-up   HPI Pleasant 76 year old female presenting today for the following:  Hypertension: Taking amlodipine-benazepril 5-20 mg daily and triamterene-hydrochlorothiazide 37.5-25 mg daily as prescribed.  Tolerating both medications well without side effects. Denies CP, SOB, palpitations, lower extremity edema, dizziness, headaches, or vision changes.  Insomnia: Is now taking Ambien 7.5 mg nightly to help with sleep.  This is prescribed by her behavioral health provider.  Notes that even with this, she is only sleeping about 4-4.5 hours per night.  Developed some GI issues including burning in the epigastric region and bowel movements after every meal which was unusual for her.  Notes that the only thing new that she had done was start Celebrex so stopped this and after a couple of days, her symptoms resolved.  Has decided that she is unable to tolerate NSAIDs and so we will need to just live with her arthralgias.   Objective:    Vitals:   01/17/22 1454  BP: 122/72  Pulse: (!) 103  Resp: 20  Height: '5\' 2"'$  (1.575 m)  Weight: 173 lb 8 oz (78.7 kg)  SpO2: 100%  BMI (Calculated): 31.73    Physical Exam Vitals and nursing note reviewed.  Constitutional:      General: She is not in acute distress.    Appearance: Normal appearance. She is not ill-appearing.  HENT:     Head: Normocephalic and atraumatic.  Cardiovascular:     Rate and Rhythm: Normal rate and regular rhythm.     Pulses: Normal pulses.     Heart sounds: Normal heart sounds.  Pulmonary:     Effort: Pulmonary effort is normal. No respiratory distress.     Breath sounds: Normal breath sounds. No wheezing, rhonchi or rales.  Skin:    General: Skin is warm and dry.  Neurological:     Mental Status: She is alert and oriented to  person, place, and time.  Psychiatric:        Mood and Affect: Mood normal.        Behavior: Behavior normal.        Thought Content: Thought content normal.        Judgment: Judgment normal.   No results found for this or any previous visit (from the past 24 hour(s)).     The 10-year ASCVD risk score (Arnett DK, et al., 2019) is: 42.1%   Values used to calculate the score:     Age: 78 years     Sex: Female     Is Non-Hispanic African American: No     Diabetic: Yes     Tobacco smoker: No     Systolic Blood Pressure: 409 mmHg     Is BP treated: Yes     HDL Cholesterol: 88 mg/dL     Total Cholesterol: 201 mg/dL   Assessment & Plan:   1. Essential hypertension Blood pressure at goal today.  Continue amlodipine-benazepril and triamterene-hydrochlorothiazide as prescribed.  Recommend low-sodium diet, regular intentional exercise, and weight loss to healthy weight.  2. Primary insomnia Managed by psychiatry.  Unfortunately Ambien does not seem to be helping very much and she is still only getting about 4 hours of sleep per night.  She is working to try and find a cognitive behavioral therapist who works with chronic insomnia  and I feel this would be a very good option for her.  3. Bilateral hand pain Discontinue Celebrex due to intolerance.  She is not interested in additional therapies at this time.  Should this change in the future, we can certainly discuss other options.  Return in about 6 months (around 07/19/2022) for chronic disease follow up.  ___________________________________________ Clearnce Sorrel, DNP, APRN, FNP-BC [facility] [place]

## 2022-01-27 ENCOUNTER — Encounter: Payer: Self-pay | Admitting: Medical-Surgical

## 2022-01-27 NOTE — Telephone Encounter (Signed)
Was just seen December 5 and this was noted in her chart:  Developed some GI issues including burning in the epigastric region and bowel movements after every meal which was unusual for her. Notes that the only thing new that she had done was start Celebrex so stopped this and after a couple of days, her symptoms resolved. Has decided that she is unable to tolerate NSAIDs and so we will need to just live with her arthralgias.

## 2022-02-02 ENCOUNTER — Ambulatory Visit: Payer: Medicare Other | Admitting: Medical-Surgical

## 2022-02-05 ENCOUNTER — Encounter (HOSPITAL_COMMUNITY): Payer: Self-pay

## 2022-02-05 ENCOUNTER — Encounter (HOSPITAL_COMMUNITY): Payer: Self-pay | Admitting: Internal Medicine

## 2022-02-05 ENCOUNTER — Emergency Department (HOSPITAL_BASED_OUTPATIENT_CLINIC_OR_DEPARTMENT_OTHER): Payer: Medicare Other

## 2022-02-05 ENCOUNTER — Other Ambulatory Visit: Payer: Self-pay

## 2022-02-05 ENCOUNTER — Inpatient Hospital Stay (HOSPITAL_BASED_OUTPATIENT_CLINIC_OR_DEPARTMENT_OTHER)
Admission: EM | Admit: 2022-02-05 | Discharge: 2022-02-09 | DRG: 418 | Disposition: A | Discharge status: Home / Self Care | Payer: Medicare Other | Attending: Internal Medicine | Admitting: Internal Medicine

## 2022-02-05 DIAGNOSIS — Z9851 Tubal ligation status: Secondary | ICD-10-CM

## 2022-02-05 DIAGNOSIS — Z79899 Other long term (current) drug therapy: Secondary | ICD-10-CM | POA: Diagnosis not present

## 2022-02-05 DIAGNOSIS — N289 Disorder of kidney and ureter, unspecified: Secondary | ICD-10-CM | POA: Diagnosis not present

## 2022-02-05 DIAGNOSIS — K859 Acute pancreatitis without necrosis or infection, unspecified: Secondary | ICD-10-CM | POA: Diagnosis not present

## 2022-02-05 DIAGNOSIS — K8689 Other specified diseases of pancreas: Secondary | ICD-10-CM | POA: Diagnosis not present

## 2022-02-05 DIAGNOSIS — E872 Acidosis, unspecified: Secondary | ICD-10-CM | POA: Diagnosis present

## 2022-02-05 DIAGNOSIS — Z888 Allergy status to other drugs, medicaments and biological substances status: Secondary | ICD-10-CM

## 2022-02-05 DIAGNOSIS — Z882 Allergy status to sulfonamides status: Secondary | ICD-10-CM | POA: Diagnosis not present

## 2022-02-05 DIAGNOSIS — R7303 Prediabetes: Secondary | ICD-10-CM | POA: Diagnosis present

## 2022-02-05 DIAGNOSIS — Z96651 Presence of right artificial knee joint: Secondary | ICD-10-CM | POA: Diagnosis present

## 2022-02-05 DIAGNOSIS — Z880 Allergy status to penicillin: Secondary | ICD-10-CM | POA: Diagnosis not present

## 2022-02-05 DIAGNOSIS — K811 Chronic cholecystitis: Secondary | ICD-10-CM | POA: Diagnosis not present

## 2022-02-05 DIAGNOSIS — E876 Hypokalemia: Secondary | ICD-10-CM | POA: Diagnosis not present

## 2022-02-05 DIAGNOSIS — K219 Gastro-esophageal reflux disease without esophagitis: Secondary | ICD-10-CM | POA: Diagnosis present

## 2022-02-05 DIAGNOSIS — M8580 Other specified disorders of bone density and structure, unspecified site: Secondary | ICD-10-CM | POA: Diagnosis present

## 2022-02-05 DIAGNOSIS — E785 Hyperlipidemia, unspecified: Secondary | ICD-10-CM | POA: Diagnosis present

## 2022-02-05 DIAGNOSIS — K8070 Calculus of gallbladder and bile duct without cholecystitis without obstruction: Secondary | ICD-10-CM | POA: Diagnosis not present

## 2022-02-05 DIAGNOSIS — Z9049 Acquired absence of other specified parts of digestive tract: Secondary | ICD-10-CM | POA: Diagnosis not present

## 2022-02-05 DIAGNOSIS — F418 Other specified anxiety disorders: Secondary | ICD-10-CM | POA: Diagnosis present

## 2022-02-05 DIAGNOSIS — I700 Atherosclerosis of aorta: Secondary | ICD-10-CM | POA: Diagnosis present

## 2022-02-05 DIAGNOSIS — R932 Abnormal findings on diagnostic imaging of liver and biliary tract: Secondary | ICD-10-CM | POA: Diagnosis not present

## 2022-02-05 DIAGNOSIS — Z8249 Family history of ischemic heart disease and other diseases of the circulatory system: Secondary | ICD-10-CM

## 2022-02-05 DIAGNOSIS — K8010 Calculus of gallbladder with chronic cholecystitis without obstruction: Secondary | ICD-10-CM | POA: Diagnosis not present

## 2022-02-05 DIAGNOSIS — I10 Essential (primary) hypertension: Secondary | ICD-10-CM | POA: Diagnosis present

## 2022-02-05 DIAGNOSIS — K838 Other specified diseases of biliary tract: Secondary | ICD-10-CM | POA: Diagnosis not present

## 2022-02-05 DIAGNOSIS — R600 Localized edema: Secondary | ICD-10-CM | POA: Diagnosis not present

## 2022-02-05 DIAGNOSIS — K851 Biliary acute pancreatitis without necrosis or infection: Secondary | ICD-10-CM | POA: Diagnosis not present

## 2022-02-05 DIAGNOSIS — R1011 Right upper quadrant pain: Secondary | ICD-10-CM | POA: Diagnosis not present

## 2022-02-05 DIAGNOSIS — K7689 Other specified diseases of liver: Secondary | ICD-10-CM | POA: Diagnosis not present

## 2022-02-05 LAB — COMPREHENSIVE METABOLIC PANEL
ALT: 30 U/L (ref 0–44)
AST: 29 U/L (ref 15–41)
Albumin: 4.4 g/dL (ref 3.5–5.0)
Alkaline Phosphatase: 89 U/L (ref 38–126)
Anion gap: 15 (ref 5–15)
BUN: 29 mg/dL — ABNORMAL HIGH (ref 8–23)
CO2: 19 mmol/L — ABNORMAL LOW (ref 22–32)
Calcium: 9 mg/dL (ref 8.9–10.3)
Chloride: 103 mmol/L (ref 98–111)
Creatinine, Ser: 1.14 mg/dL — ABNORMAL HIGH (ref 0.44–1.00)
GFR, Estimated: 50 mL/min — ABNORMAL LOW (ref >60)
Glucose, Bld: 152 mg/dL — ABNORMAL HIGH (ref 70–99)
Potassium: 3.4 mmol/L — ABNORMAL LOW (ref 3.5–5.1)
Sodium: 137 mmol/L (ref 135–145)
Total Bilirubin: 0.4 mg/dL (ref 0.3–1.2)
Total Protein: 7.6 g/dL (ref 6.5–8.1)

## 2022-02-05 LAB — URINALYSIS, ROUTINE W REFLEX MICROSCOPIC
Bacteria, UA: NONE SEEN
Bilirubin Urine: NEGATIVE
Glucose, UA: NEGATIVE mg/dL
Hgb urine dipstick: NEGATIVE
Ketones, ur: NEGATIVE mg/dL
Leukocytes,Ua: NEGATIVE
Nitrite: NEGATIVE
Protein, ur: 30 mg/dL — AB
Specific Gravity, Urine: 1.021 (ref 1.005–1.030)
pH: 5.5 (ref 5.0–8.0)

## 2022-02-05 LAB — CBC
HCT: 35.5 % — ABNORMAL LOW (ref 36.0–46.0)
Hemoglobin: 12.1 g/dL (ref 12.0–15.0)
MCH: 28.5 pg (ref 26.0–34.0)
MCHC: 34.1 g/dL (ref 30.0–36.0)
MCV: 83.7 fL (ref 80.0–100.0)
Platelets: 405 10*3/uL — ABNORMAL HIGH (ref 150–400)
RBC: 4.24 MIL/uL (ref 3.87–5.11)
RDW: 14.8 % (ref 11.5–15.5)
WBC: 15.6 10*3/uL — ABNORMAL HIGH (ref 4.0–10.5)
nRBC: 0 % (ref 0.0–0.2)

## 2022-02-05 LAB — PHOSPHORUS: Phosphorus: 4.1 mg/dL (ref 2.5–4.6)

## 2022-02-05 LAB — LIPASE, BLOOD: Lipase: 8407 U/L — ABNORMAL HIGH (ref 11–51)

## 2022-02-05 LAB — MAGNESIUM: Magnesium: 1.8 mg/dL (ref 1.7–2.4)

## 2022-02-05 MED ORDER — METOPROLOL TARTRATE 5 MG/5ML IV SOLN: 5.0000 mg | Freq: Three times a day (TID) | INTRAVENOUS | Status: DC | PRN

## 2022-02-05 MED ORDER — ONDANSETRON HCL 4 MG PO TABS: 4.0000 mg | ORAL_TABLET | Freq: Four times a day (QID) | ORAL | Status: DC | PRN

## 2022-02-05 MED ORDER — ACETAMINOPHEN 650 MG RE SUPP: 650.0000 mg | Freq: Four times a day (QID) | RECTAL | Status: DC | PRN

## 2022-02-05 MED ORDER — SODIUM CHLORIDE 0.9 % IV BOLUS
1000.0000 mL | Freq: Once | INTRAVENOUS | Status: AC
  Administered 2022-02-05: 1000 mL via INTRAVENOUS

## 2022-02-05 MED ORDER — VENLAFAXINE HCL ER 150 MG PO CP24
150.0000 mg | ORAL_CAPSULE | Freq: Every day | ORAL | Status: DC
  Administered 2022-02-06 – 2022-02-09 (×4): 150 mg via ORAL
  Filled 2022-02-05 (×4): qty 1

## 2022-02-05 MED ORDER — IOHEXOL 300 MG/ML  SOLN
100.0000 mL | Freq: Once | INTRAMUSCULAR | Status: AC | PRN
  Administered 2022-02-05: 80 mL via INTRAVENOUS

## 2022-02-05 MED ORDER — MAGNESIUM SULFATE 2 GM/50ML IV SOLN
2.0000 g | Freq: Once | INTRAVENOUS | Status: AC
  Administered 2022-02-05: 2 g via INTRAVENOUS
  Filled 2022-02-05: qty 50

## 2022-02-05 MED ORDER — ACETAMINOPHEN 325 MG PO TABS: 650.0000 mg | ORAL_TABLET | Freq: Four times a day (QID) | ORAL | Status: DC | PRN

## 2022-02-05 MED ORDER — ONDANSETRON HCL 4 MG/2ML IJ SOLN: 4.0000 mg | Freq: Four times a day (QID) | INTRAMUSCULAR | Status: DC | PRN

## 2022-02-05 MED ORDER — POTASSIUM CHLORIDE 10 MEQ/100ML IV SOLN: 10.0000 meq | INTRAVENOUS | Status: DC

## 2022-02-05 MED ORDER — MORPHINE SULFATE (PF) 4 MG/ML IV SOLN
4.0000 mg | Freq: Once | INTRAVENOUS | Status: AC
  Administered 2022-02-05: 4 mg via INTRAVENOUS
  Filled 2022-02-05: qty 1

## 2022-02-05 MED ORDER — ONDANSETRON 4 MG PO TBDP
4.0000 mg | ORAL_TABLET | Freq: Once | ORAL | Status: AC | PRN
  Administered 2022-02-05: 4 mg via ORAL
  Filled 2022-02-05: qty 1

## 2022-02-05 MED ORDER — ONDANSETRON HCL 4 MG/2ML IJ SOLN
4.0000 mg | Freq: Once | INTRAMUSCULAR | Status: AC
  Administered 2022-02-05: 4 mg via INTRAVENOUS
  Filled 2022-02-05 (×2): qty 2

## 2022-02-05 MED ORDER — SODIUM CHLORIDE 0.9 % IV SOLN
2.0000 g | INTRAVENOUS | Status: DC
  Administered 2022-02-06 – 2022-02-08 (×3): 2 g via INTRAVENOUS
  Filled 2022-02-05 (×3): qty 20

## 2022-02-05 MED ORDER — PANTOPRAZOLE SODIUM 40 MG IV SOLR
40.0000 mg | INTRAVENOUS | Status: DC
  Administered 2022-02-05 – 2022-02-08 (×4): 40 mg via INTRAVENOUS
  Filled 2022-02-05 (×4): qty 10

## 2022-02-05 MED ORDER — SODIUM CHLORIDE 0.9 % IV SOLN
1.0000 g | Freq: Once | INTRAVENOUS | Status: AC
  Administered 2022-02-05: 1 g via INTRAVENOUS
  Filled 2022-02-05: qty 10

## 2022-02-05 MED ORDER — AMLODIPINE BESYLATE 5 MG PO TABS
5.0000 mg | ORAL_TABLET | Freq: Every day | ORAL | Status: DC
  Administered 2022-02-06 – 2022-02-09 (×4): 5 mg via ORAL
  Filled 2022-02-05 (×4): qty 1

## 2022-02-05 MED ORDER — POTASSIUM CHLORIDE 10 MEQ/100ML IV SOLN
10.0000 meq | INTRAVENOUS | Status: AC
  Administered 2022-02-05 (×4): 10 meq via INTRAVENOUS
  Filled 2022-02-05 (×4): qty 100

## 2022-02-05 MED ORDER — ATORVASTATIN CALCIUM 10 MG PO TABS
20.0000 mg | ORAL_TABLET | Freq: Every day | ORAL | Status: DC
  Administered 2022-02-06 – 2022-02-09 (×4): 20 mg via ORAL
  Filled 2022-02-05 (×4): qty 2

## 2022-02-05 MED ORDER — SODIUM CHLORIDE 0.9 % IV SOLN: Freq: Once | INTRAVENOUS | Status: DC

## 2022-02-05 MED ORDER — BENAZEPRIL HCL 20 MG PO TABS
20.0000 mg | ORAL_TABLET | Freq: Every day | ORAL | Status: DC
  Administered 2022-02-06 – 2022-02-09 (×4): 20 mg via ORAL
  Filled 2022-02-05 (×4): qty 1

## 2022-02-05 MED ORDER — HYDROMORPHONE HCL 1 MG/ML IJ SOLN
0.5000 mg | INTRAMUSCULAR | Status: DC | PRN
  Administered 2022-02-05 – 2022-02-06 (×5): 0.5 mg via INTRAVENOUS
  Filled 2022-02-05 (×5): qty 0.5

## 2022-02-05 MED ORDER — POTASSIUM CHLORIDE IN NACL 20-0.9 MEQ/L-% IV SOLN
INTRAVENOUS | Status: DC
  Administered 2022-02-06: 1000 mL via INTRAVENOUS
  Filled 2022-02-05 (×5): qty 1000

## 2022-02-05 NOTE — H&P (Signed)
History and Physical    Patient: [name] [id number]:[id number] DOB: 23-May-1945 DOA: 02/05/2022 DOS: the patient was seen and examined on 02/05/2022 PCP: Samuel Bouche, NP  Patient coming from: Home  Chief Complaint:  Chief Complaint  Patient presents with   Abdominal Pain   HPI: [name] is a 76 y.o. female with medical history significant of anxiety, depression, sinus headache, hyperlipidemia, hypertension, osteopenia, osteoarthritis of the right knee who presented to the emergency department with complaints of several weeks of generalized abdominal pain, heartburn, frequent diarrhea, nausea and fatigue.  She has also noticed that her stools look yellow and are floating on the toilet bowl.  Yesterday she ate a cheese sandwich, and her symptoms got a lot worse.  No alcohol use.  She uses Maxide.  Her cholesterol levels have being under control with atorvastatin.  No fever, chills or night sweats. No sore throat, rhinorrhea, dyspnea, wheezing or hemoptysis.  No chest pain, palpitations, diaphoresis, PND, orthopnea or pitting edema of the lower extremities.  No appetite changes, abdominal pain, diarrhea, constipation, melena or hematochezia.  No flank pain, dysuria, frequency or hematuria.  No polyuria, polydipsia, polyphagia or blurred vision.  ED course: Initial vital signs were temperature 98 F, pulse 72, respiration 18, BP 173/73 mmHg and O2 sat 98% on room air.  The patient received ceftriaxone 1 g IVPB, morphine 4 mg IVP, Zofran 4 mg IVP, Zofran 4 mg ODT and 1000 mL of LR bolus.  See data reviewed below for lab results and imaging.   Review of Systems: As mentioned in the history of present illness. All other systems reviewed and are negative.  Past Medical History:  Diagnosis Date   Anxiety    Depression    Headache    SINUS   Hyperlipidemia    Hypertension    Osteopenia    Primary localized osteoarthritis of right knee    Past Surgical History:  Procedure  Laterality Date   BREAST EXCISIONAL BIOPSY Right    benign cyst removal    CARPAL TUNNEL RELEASE Right 2015   Ortho in [place] Right 2011   Orthopedic in [place] Right 09/20/2015   TOTAL KNEE ARTHROPLASTY Right 09/20/2015   Procedure: TOTAL KNEE ARTHROPLASTY;  Surgeon: Elsie Saas, MD;  Location: [place];  Service: Orthopedics;  Laterality: Right;   Trigger Thumb Right 2015   Done in [place] at the same time as the carpal tunnel release   TUBAL LIGATION  1980   Social History:  reports that she has never smoked. She has never used smokeless tobacco. She reports that she does not drink alcohol and does not use drugs.  Allergies  Allergen Reactions   Penicillins Hives    Tolerates Cephalosporins    Prednisone Other (See Comments)    Hyperactivity, sleep disturbance, confusion   Sulfa Antibiotics Hives         Family History  Problem Relation Age of Onset   Heart failure Mother    Heart attack Mother    Heart attack Father    Irregular heart beat Sister    Hypertension Sister    Heart disease Brother    Alcohol abuse Maternal Aunt     Prior to Admission medications   Medication Sig Start Date End Date Taking? Authorizing Provider  amLODipine-benazepril (LOTREL) 5-20 MG capsule Take 1 capsule by mouth daily. 06/30/21   Samuel Bouche, NP  atorvastatin (LIPITOR) 20 MG  tablet TAKE ONE TABLET BY MOUTH ONE TIME A DAY 06/30/21   Samuel Bouche, NP  clonazePAM (KLONOPIN) 0.5 MG tablet TAKE 1/2 TO 1 TABLET BY MOUTH TWO TIMES A DAY AS NEEDED 12/30/21   Samuel Bouche, NP  fluticasone (FLONASE) 50 MCG/ACT nasal spray Place into both nostrils daily.    [provider]  raloxifene (EVISTA) 60 MG tablet TAKE 1 TABLET BY MOUTH DAILY 01/12/22   Samuel Bouche, NP  triamterene-hydrochlorothiazide (DYAZIDE) 37.5-25 MG capsule TAKE ONE CAPSULE BY MOUTH ONE TIME A DAY 06/30/21   Samuel Bouche, NP  venlafaxine XR (EFFEXOR-XR) 150 MG 24 hr capsule  Take 1 capsule (150 mg total) by mouth daily with breakfast. 10/19/21   Salley Slaughter, NP  zolpidem (AMBIEN) 5 MG tablet Take 1.5 tablets (7.5 mg total) by mouth at bedtime as needed for sleep. 01/15/22 03/16/22  Alda Berthold    Physical Exam: Vitals:   02/05/22 1300 02/05/22 1400 02/05/22 1437 02/05/22 1443  BP: (!) 165/60 (!) 152/57 (!) 177/59   Pulse: 96 94 91 86  Resp: '16 16  20  '$ Temp:    98 F (36.7 C)  TempSrc:    Oral  SpO2: 98% 95%  100%   Physical Exam Vitals and nursing note reviewed.  Constitutional:      General: She is awake. She is not in acute distress.    Appearance: She is obese.  HENT:     Head: Normocephalic.     Nose: No rhinorrhea.     Mouth/Throat:     Mouth: Mucous membranes are dry.  Eyes:     General: No scleral icterus.    Pupils: Pupils are equal, round, and reactive to light.  Neck:     Vascular: No JVD.  Cardiovascular:     Rate and Rhythm: Normal rate and regular rhythm.     Heart sounds: S1 normal and S2 normal.  Pulmonary:     Effort: Pulmonary effort is normal.     Breath sounds: Normal breath sounds.  Abdominal:     General: There is no distension.     Palpations: Abdomen is soft.     Tenderness: There is abdominal tenderness in the right upper quadrant, right lower quadrant, epigastric area and periumbilical area. There is right CVA tenderness. There is no left CVA tenderness.  Musculoskeletal:     Cervical back: Neck supple.     Right lower leg: No edema.     Left lower leg: No edema.  Skin:    General: Skin is warm and dry.  Neurological:     General: No focal deficit present.     Mental Status: She is alert and oriented to person, place, and time.  Psychiatric:        Mood and Affect: Mood normal.        Behavior: Behavior normal. Behavior is cooperative.   Data Reviewed: Urinalysis, Routine w reflex microscopic [[id number]] (Abnormal)    Collected: 02/05/22 1018    Updated: 02/05/22 1146    Specimen Type: Urine       Color, Urine YELLOW    APPearance HAZY Abnormal     Specific Gravity, Urine 1.021    pH 5.5    Glucose, UA NEGATIVE mg/dL    Hgb urine dipstick NEGATIVE    Bilirubin Urine NEGATIVE    Ketones, ur NEGATIVE mg/dL    Protein, ur 30 Abnormal  mg/dL    Nitrite NEGATIVE    Leukocytes,Ua NEGATIVE    RBC /  HPF 0-5 RBC/hpf    WBC, UA 0-5 WBC/hpf    Bacteria, UA NONE SEEN    Squamous Epithelial / LPF 0-5    Mucus PRESENT    Hyaline Casts, UA PRESENT  Lipase, blood [[id number]] (Abnormal)    Collected: 02/05/22 1004    Updated: 02/05/22 1133    Specimen Type: Blood    Specimen Source: Vein      Lipase 8,407 High  U/L  Comprehensive metabolic panel [[id number]] (Abnormal)    Collected: 02/05/22 1004    Updated: 02/05/22 1051    Specimen Type: Blood    Specimen Source: Vein      Sodium 137 mmol/L    Potassium 3.4 Low  mmol/L    Chloride 103 mmol/L    CO2 19 Low  mmol/L    Glucose, Bld 152 High  mg/dL    BUN 29 High  mg/dL    Creatinine, Ser 1.14 High  mg/dL    Calcium 9.0 mg/dL    Total Protein 7.6 g/dL    Albumin 4.4 g/dL    AST 29 U/L    ALT 30 U/L    Alkaline Phosphatase 89 U/L    Total Bilirubin 0.4 mg/dL    GFR, Estimated 50 Low  mL/min    Anion gap 15  CBC [[id number]] (Abnormal)    Collected: 02/05/22 1004    Updated: 02/05/22 1035    Specimen Type: Blood    Specimen Source: Vein      WBC 15.6 High  K/uL    RBC 4.24 MIL/uL    Hemoglobin 12.1 g/dL    HCT 35.5 Low  %    MCV 83.7 fL    MCH 28.5 pg    MCHC 34.1 g/dL    RDW 14.8 %    Platelets 405 High  K/uL    nRBC 0.0 %    Imaging: IMPRESSION: 1. 2-3 mm stone projects in the distal common bile duct at the level of the ampulla of Vater. No common bile duct dilation or intrahepatic bile duct dilation. Mild dilation of the pancreatic duct. There are subtle inflammatory changes adjacent to the pancreatic head and second and third portion of the duodenum. Suspect minimal uncomplicated pancreatitis. 2. No other  evidence of an acute abnormality within the abdomen or pelvis. 3. Low-attenuation endometrium appears mildly thickened for this patient's age. Consider nonemergent follow-up pelvic ultrasound. 4. Aortic atherosclerosis.   Electronically Signed   By: Lajean Manes M.D.   On: 02/05/2022 12:37  Please see full report for further details.  Assessment and Plan: Principal Problem:   Acute gallstone pancreatitis Observation/MedSurg. Continue IV fluids. Keep n.p.o. for now. Analgesics as needed. Antiemetics as needed. Pantoprazole 40 mg IVP daily. Follow CBC, CMP and lipase in AM. [facility] will see in AM.  Active Problems:   Essential hypertension Continue amlodipine 5 mg p.o. daily. Continue benazepril 20 mg p.o. daily.    Anxiety associated with depression Continue venlafaxine XR 150 mg p.o. with breakfast    Prediabetes Currently NPO. Monitor CBG. Carbohydrate modified diet.    Hyperlipidemia   Aortic atherosclerosis (HCC) Continue atorvastatin 20 mg p.o. daily.     Advance Care Planning:   Code Status: Full Code   Consults: [facility].  Family Communication:   Severity of Illness: The appropriate patient status for this patient is INPATIENT. Inpatient status is judged to be reasonable and necessary in order to provide the required intensity of service to ensure the patient's safety. The patient's presenting  symptoms, physical exam findings, and initial radiographic and laboratory data in the context of their chronic comorbidities is felt to place them at high risk for further clinical deterioration. Furthermore, it is not anticipated that the patient will be medically stable for discharge from the hospital within 2 midnights of admission.   * I certify that at the point of admission it is my clinical judgment that the patient will require inpatient hospital care spanning beyond 2 midnights from the point of admission due to high intensity of service, high  risk for further deterioration and high frequency of surveillance required.*  Author: Reubin Milan, MD 02/05/2022 3:06 PM  For on call review [email or web address].   This document was prepared using Dragon voice recognition software and may contain some unintended transcription errors.

## 2022-02-05 NOTE — ED Triage Notes (Addendum)
Pt c/o yellow stools and "burning feeling" in her stomach, along with some nausea, no vomiting, +bloating and gas, + fatigue.  Pt reports symptoms started with "burning stomach and yellow stool" 2-3 weeks ago.  Pt tried to see PMD last week, but had diarrhea that day, so cancelled intending to reschedule.  Pt states the discomfort last night was preventing her from sleeping.

## 2022-02-05 NOTE — ED Notes (Signed)
Report given to [facility] and attempted to the floor RN.

## 2022-02-05 NOTE — ED Provider Notes (Signed)
[facility] EMERGENCY DEPT Provider Note   CSN: [id number] Arrival date & time: 02/05/22  5400     History  Chief Complaint  Patient presents with   Abdominal Pain    [name] is a 76 y.o. female.  She is a fairly healthy 76 year old.  She has had a few weeks of some ongoing generalized abdominal pain loose stool nausea vomiting.  Symptoms all got worse yesterday passing yellow stool.  No fevers.  Pain is worse with eating and she rates it as 5 out of 10.  No prior history of same.  No trauma no fever no urinary symptoms.  Prior surgical history of a tubal ligation  The history is provided by the patient.  Abdominal Pain Pain location:  Generalized Pain quality: aching   Pain radiates to:  Back Pain severity:  Moderate Onset quality:  Gradual Duration:  2 weeks Timing:  Intermittent Progression:  Worsening Chronicity:  New Context: not alcohol use and not trauma   Relieved by:  Nothing Worsened by:  Eating Ineffective treatments:  None tried Associated symptoms: belching, diarrhea, nausea and vomiting   Associated symptoms: no chest pain, no constipation, no cough, no dysuria, no fever, no hematemesis, no hematochezia, no hematuria and no shortness of breath        Home Medications Prior to Admission medications   Medication Sig Start Date End Date Taking? Authorizing Provider  amLODipine-benazepril (LOTREL) 5-20 MG capsule Take 1 capsule by mouth daily. 06/30/21   Samuel Bouche, NP  atorvastatin (LIPITOR) 20 MG tablet TAKE ONE TABLET BY MOUTH ONE TIME A DAY 06/30/21   Samuel Bouche, NP  clonazePAM (KLONOPIN) 0.5 MG tablet TAKE 1/2 TO 1 TABLET BY MOUTH TWO TIMES A DAY AS NEEDED 12/30/21   Samuel Bouche, NP  fluticasone (FLONASE) 50 MCG/ACT nasal spray Place into both nostrils daily.    [provider]  raloxifene (EVISTA) 60 MG tablet TAKE 1 TABLET BY MOUTH DAILY 01/12/22   Samuel Bouche, NP  triamterene-hydrochlorothiazide (DYAZIDE) 37.5-25 MG  capsule TAKE ONE CAPSULE BY MOUTH ONE TIME A DAY 06/30/21   Samuel Bouche, NP  venlafaxine XR (EFFEXOR-XR) 150 MG 24 hr capsule Take 1 capsule (150 mg total) by mouth daily with breakfast. 10/19/21   Salley Slaughter, NP  zolpidem (AMBIEN) 5 MG tablet Take 1.5 tablets (7.5 mg total) by mouth at bedtime as needed for sleep. 01/15/22 03/16/22  Bahraini, Sarah A      Allergies    Penicillins, Prednisone, and Sulfa antibiotics    Review of Systems   Review of Systems  Constitutional:  Negative for fever.  Respiratory:  Negative for cough and shortness of breath.   Cardiovascular:  Negative for chest pain.  Gastrointestinal:  Positive for abdominal pain, diarrhea, nausea and vomiting. Negative for constipation, hematemesis and hematochezia.  Genitourinary:  Negative for dysuria and hematuria.    Physical Exam Updated Vital Signs BP (!) 172/78 (BP Location: Right Arm)   Pulse (!) 102   Temp 98 F (36.7 C) (Oral)   Resp 18   SpO2 100%  Physical Exam Vitals and nursing note reviewed.  Constitutional:      General: She is not in acute distress.    Appearance: Normal appearance. She is well-developed.  HENT:     Head: Normocephalic and atraumatic.  Eyes:     Conjunctiva/sclera: Conjunctivae normal.  Cardiovascular:     Rate and Rhythm: Normal rate and regular rhythm.     Heart sounds: No murmur heard.  Pulmonary:     Effort: Pulmonary effort is normal. No respiratory distress.     Breath sounds: Normal breath sounds.  Abdominal:     Palpations: Abdomen is soft.     Tenderness: There is generalized abdominal tenderness. There is no guarding or rebound.  Musculoskeletal:        General: No swelling.     Cervical back: Neck supple.  Skin:    General: Skin is warm and dry.     Capillary Refill: Capillary refill takes less than 2 seconds.  Neurological:     General: No focal deficit present.     Mental Status: She is alert.     ED Results / Procedures / Treatments   Labs (all labs  ordered are listed, but only abnormal results are displayed) Labs Reviewed  LIPASE, BLOOD - Abnormal; Notable for the following components:      Result Value   Lipase 8,407 (*)    All other components within normal limits  COMPREHENSIVE METABOLIC PANEL - Abnormal; Notable for the following components:   Potassium 3.4 (*)    CO2 19 (*)    Glucose, Bld 152 (*)    BUN 29 (*)    Creatinine, Ser 1.14 (*)    GFR, Estimated 50 (*)    All other components within normal limits  CBC - Abnormal; Notable for the following components:   WBC 15.6 (*)    HCT 35.5 (*)    Platelets 405 (*)    All other components within normal limits  URINALYSIS, ROUTINE W REFLEX MICROSCOPIC - Abnormal; Notable for the following components:   APPearance HAZY (*)    Protein, ur 30 (*)    All other components within normal limits  MAGNESIUM  PHOSPHORUS  COMPREHENSIVE METABOLIC PANEL  CBC  LIPASE, BLOOD    EKG None  Radiology CT Abdomen Pelvis W Contrast  Result Date: 02/05/2022 CLINICAL DATA:  Abdominal pain.  Elevated lipase. EXAM: CT ABDOMEN AND PELVIS WITH CONTRAST TECHNIQUE: Multidetector CT imaging of the abdomen and pelvis was performed using the standard protocol following bolus administration of intravenous contrast. RADIATION DOSE REDUCTION: This exam was performed according to the departmental dose-optimization program which includes automated exposure control, adjustment of the mA and/or kV according to patient size and/or use of iterative reconstruction technique. CONTRAST:  19m OMNIPAQUE IOHEXOL 300 MG/ML  SOLN COMPARISON:  None Available. FINDINGS: Lower chest: No acute findings. Hepatobiliary: Normal liver. Gallbladder is distended, without evidence of stones or wall thickening. Common bile duct is normal in caliber. There is a small stone, 2-3 mm, that projects at the ampullary of Vater. Pancreas: Mild dilation of the pancreatic duct, maximum 4 mm. There is subtle inflammatory changes adjacent to  the pancreatic head and second and third portions of the duodenum, along the adjacent anterior right pararenal fascia. Remainder of the pancreas is unremarkable. No pancreatic mass. No peripancreatic fluid collection. Spleen: Normal in size without focal abnormality. Adrenals/Urinary Tract: Normal adrenal glands. Kidneys normal in size, orientation and position with symmetric enhancement and excretion. Small low-attenuation lesions posterior upper pole the right kidney, anterior lower pole of the left kidney, too small to fully characterize, but consistent with cysts. No follow-up recommended. No renal stones. No hydronephrosis. Normal ureters. Normal bladder. Stomach/Bowel: Stomach decompressed, otherwise unremarkable. Small bowel and colon are normal in caliber. No wall thickening. No inflammation. Normal appendix visualized. Vascular/Lymphatic: Aortic atherosclerosis. No aneurysm. No enlarged lymph nodes. Reproductive: Uterus normal in size. Lower attenuation endometrium is prominent measuring  9 mm in thickness. No adnexal masses. Other: No abdominal wall hernia or abnormality. No abdominopelvic ascites. Musculoskeletal: No fracture or acute finding.  No bone lesion. IMPRESSION: 1. 2-3 mm stone projects in the distal common bile duct at the level of the ampulla of Vater. No common bile duct dilation or intrahepatic bile duct dilation. Mild dilation of the pancreatic duct. There are subtle inflammatory changes adjacent to the pancreatic head and second and third portion of the duodenum. Suspect minimal uncomplicated pancreatitis. 2. No other evidence of an acute abnormality within the abdomen or pelvis. 3. Low-attenuation endometrium appears mildly thickened for this patient's age. Consider nonemergent follow-up pelvic ultrasound. 4. Aortic atherosclerosis. Electronically Signed   By: Lajean Manes M.D.   On: 02/05/2022 12:37    Procedures Procedures    Medications Ordered in ED Medications  potassium  chloride 10 mEq in 100 mL IVPB (10 mEq Intravenous New Bag/Given 02/05/22 1800)  acetaminophen (TYLENOL) tablet 650 mg (has no administration in time range)    Or  acetaminophen (TYLENOL) suppository 650 mg (has no administration in time range)  ondansetron (ZOFRAN) tablet 4 mg (has no administration in time range)    Or  ondansetron (ZOFRAN) injection 4 mg (has no administration in time range)  HYDROmorphone (DILAUDID) injection 0.5 mg (has no administration in time range)  pantoprazole (PROTONIX) injection 40 mg (40 mg Intravenous Given 02/05/22 1516)  0.9 % NaCl with KCl 20 mEq/ L  infusion ( Intravenous New Bag/Given 02/05/22 1530)  magnesium sulfate IVPB 2 g 50 mL (has no administration in time range)  venlafaxine XR (EFFEXOR-XR) 24 hr capsule 150 mg (has no administration in time range)  atorvastatin (LIPITOR) tablet 20 mg (has no administration in time range)  amLODipine (NORVASC) tablet 5 mg (has no administration in time range)    And  benazepril (LOTENSIN) tablet 20 mg (has no administration in time range)  metoprolol tartrate (LOPRESSOR) injection 5 mg (has no administration in time range)  ondansetron (ZOFRAN-ODT) disintegrating tablet 4 mg (4 mg Oral Given 02/05/22 1022)  sodium chloride 0.9 % bolus 1,000 mL (0 mLs Intravenous Stopped 02/05/22 1309)  ondansetron (ZOFRAN) injection 4 mg (4 mg Intravenous Given 02/05/22 1201)  morphine (PF) 4 MG/ML injection 4 mg (4 mg Intravenous Given 02/05/22 1203)  iohexol (OMNIPAQUE) 300 MG/ML solution 100 mL (80 mLs Intravenous Contrast Given 02/05/22 1218)  cefTRIAXone (ROCEPHIN) 1 g in sodium chloride 0.9 % 100 mL IVPB (1 g Intravenous New Bag/Given 02/05/22 1522)    ED Course/ Medical Decision Making/ A&P Clinical Course as of 02/05/22 1837  Sun Feb 05, 2022  1333 I reviewed case with [facility] physician Dr. Randel Pigg.  She said the patient can be admitted to the hospitalist and [facility] will consult on patient.  She looped in Dr. Lyndel Safe.   Further recommendations to follow but currently bowel rest antibiotics pain control [MB]  1334 Discussed with [facility] hospitalist Dr. Olevia Bowens [MB]    Clinical Course User Index [MB] Hayden Rasmussen, MD                           Medical Decision Making Amount and/or Complexity of Data Reviewed Labs: ordered. Radiology: ordered.  Risk Prescription drug management. Decision regarding hospitalization.   This patient complains of abdominal pain nausea diarrhea; this involves an extensive number of treatment Options and is a complaint that carries with it a high risk of complications and morbidity. The differential includes  obstruction, colitis, diverticulitis, perforation, pancreatitis  I ordered, reviewed and interpreted labs, which included CBC with elevated white count stable hemoglobin, chemistries with mild AKI, LFTs normal, lipase markedly elevated I ordered medication IV fluids and pain medication IV antibiotics and reviewed PMP when indicated. I ordered imaging studies which included CT abdomen and pelvis and I independently    visualized and interpreted imaging which showed possible common bile duct stone inflammation of pancreas Additional history obtained from patient's husband Previous records obtained and reviewed in epic no recent admissions I consulted [facility] Dr. Randel Pigg and and [facility] hospitalist Dr. Olevia Bowens and discussed lab and imaging findings and discussed disposition.  Cardiac monitoring reviewed, normal sinus rhythm Social determinants considered, no significant barriers Critical Interventions: None  After the interventions stated above, I reevaluated the patient and found patient be nontoxic-appearing in no distress Admission and further testing considered, she would benefit from mission to the hospital for bowel rest IV fluids pain control and further GI workup.  Patient in agreement with plan.         Final Clinical Impression(s) / ED Diagnoses Final  diagnoses:  Gallstone pancreatitis    Rx / DC Orders ED Discharge Orders     None         Hayden Rasmussen, MD 02/05/22 1839

## 2022-02-05 NOTE — ED Notes (Signed)
Report attempted to the Floor RN... RN was currently busy and unable to take report... They were notified that [facility] was enroute.Marland KitchenMarland Kitchen

## 2022-02-05 NOTE — ED Notes (Signed)
Called [facility] and spoke to Naranja; informed that the patient Bed Assignment is ready

## 2022-02-05 NOTE — Progress Notes (Signed)
Plan of Care Note for accepted transfer   Patient: [name] MRN: [id number]   [place]: 02/05/2022  Facility requesting transfer: [facility] Requesting Provider: Ardean Larsen, MD Reason for transfer: Gallstone pancreatitis. Facility course:  " Chief Complaint  Patient presents with   Abdominal Pain    [name] is a 76 y.o. female.  She is a fairly healthy 76 year old.  She has had a few weeks of some ongoing generalized abdominal pain loose stool nausea vomiting.  Symptoms all got worse yesterday passing yellow stool.  No fevers.  Pain is worse with eating and she rates it as 5 out of 10.  No prior history of same.  No trauma no fever no urinary symptoms.  Prior surgical history of a tubal ligation."  Lab work: Urinalysis, Routine w reflex microscopic [[id number]] (Abnormal)   Collected: 02/05/22 1018   Updated: 02/05/22 1146   Specimen Type: Urine    Color, Urine YELLOW   APPearance HAZY Abnormal    Specific Gravity, Urine 1.021   pH 5.5   Glucose, UA NEGATIVE mg/dL   Hgb urine dipstick NEGATIVE   Bilirubin Urine NEGATIVE   Ketones, ur NEGATIVE mg/dL   Protein, ur 30 Abnormal  mg/dL   Nitrite NEGATIVE   Leukocytes,Ua NEGATIVE   RBC / HPF 0-5 RBC/hpf   WBC, UA 0-5 WBC/hpf   Bacteria, UA NONE SEEN   Squamous Epithelial / LPF 0-5   Mucus PRESENT   Hyaline Casts, UA PRESENT  Lipase, blood [[id number]] (Abnormal)   Collected: 02/05/22 1004   Updated: 02/05/22 1133   Specimen Type: Blood   Specimen Source: Vein    Lipase 8,407 High  U/L  Comprehensive metabolic panel [[id number]] (Abnormal)   Collected: 02/05/22 1004   Updated: 02/05/22 1051   Specimen Type: Blood   Specimen Source: Vein    Sodium 137 mmol/L   Potassium 3.4 Low  mmol/L   Chloride 103 mmol/L   CO2 19 Low  mmol/L   Glucose, Bld 152 High  mg/dL   BUN 29 High  mg/dL   Creatinine, Ser 1.14 High  mg/dL   Calcium 9.0 mg/dL   Total Protein 7.6 g/dL   Albumin 4.4 g/dL   AST 29 U/L   ALT 30 U/L    Alkaline Phosphatase 89 U/L   Total Bilirubin 0.4 mg/dL   GFR, Estimated 50 Low  mL/min   Anion gap 15  CBC [[id number]] (Abnormal)   Collected: 02/05/22 1004   Updated: 02/05/22 1035   Specimen Type: Blood   Specimen Source: Vein    WBC 15.6 High  K/uL   RBC 4.24 MIL/uL   Hemoglobin 12.1 g/dL   HCT 35.5 Low  %   MCV 83.7 fL   MCH 28.5 pg   MCHC 34.1 g/dL   RDW 14.8 %   Platelets 405 High  K/uL   nRBC 0.0 %   Imaging: IMPRESSION: 1. 2-3 mm stone projects in the distal common bile duct at the level of the ampulla of Vater. No common bile duct dilation or intrahepatic bile duct dilation. Mild dilation of the pancreatic duct. There are subtle inflammatory changes adjacent to the pancreatic head and second and third portion of the duodenum. Suspect minimal uncomplicated pancreatitis. 2. No other evidence of an acute abnormality within the abdomen or pelvis. 3. Low-attenuation endometrium appears mildly thickened for this patient's age. Consider nonemergent follow-up pelvic ultrasound. 4. Aortic atherosclerosis.   Electronically Signed   By: Lajean Manes M.D.   On:  02/05/2022 12:37  Plan of care: The patient is accepted for admission to [facility]  unit, at [facility] will be consulting on the patient and determining if she needs a MRCP.  In the meantime, they recommended IV fluids, symptomatic treatment and labs follow-up. Author: Reubin Milan, MD 02/05/2022  Check [email or web address] for on-call coverage.  Nursing staff, Please call [facility] number on Amion as soon as patient's arrival, so appropriate admitting provider can evaluate the pt.

## 2022-02-05 NOTE — Plan of Care (Signed)
  Problem: Nutrition: Goal: Adequate nutrition will be maintained Outcome: Progressing   Problem: Pain Managment: Goal: General experience of comfort will improve Outcome: Progressing   Problem: Safety: Goal: Ability to remain free from injury will improve Outcome: Progressing   

## 2022-02-06 DIAGNOSIS — K851 Biliary acute pancreatitis without necrosis or infection: Secondary | ICD-10-CM | POA: Diagnosis not present

## 2022-02-06 LAB — COMPREHENSIVE METABOLIC PANEL
ALT: 28 U/L (ref 0–44)
AST: 29 U/L (ref 15–41)
Albumin: 3.8 g/dL (ref 3.5–5.0)
Alkaline Phosphatase: 84 U/L (ref 38–126)
Anion gap: 10 (ref 5–15)
BUN: 18 mg/dL (ref 8–23)
CO2: 20 mmol/L — ABNORMAL LOW (ref 22–32)
Calcium: 8.6 mg/dL — ABNORMAL LOW (ref 8.9–10.3)
Chloride: 112 mmol/L — ABNORMAL HIGH (ref 98–111)
Creatinine, Ser: 0.94 mg/dL (ref 0.44–1.00)
GFR, Estimated: >60 mL/min (ref >60)
Glucose, Bld: 112 mg/dL — ABNORMAL HIGH (ref 70–99)
Potassium: 4 mmol/L (ref 3.5–5.1)
Sodium: 142 mmol/L (ref 135–145)
Total Bilirubin: 0.6 mg/dL (ref 0.3–1.2)
Total Protein: 7.4 g/dL (ref 6.5–8.1)

## 2022-02-06 LAB — CBC
HCT: 33.8 % — ABNORMAL LOW (ref 36.0–46.0)
Hemoglobin: 11 g/dL — ABNORMAL LOW (ref 12.0–15.0)
MCH: 28.1 pg (ref 26.0–34.0)
MCHC: 32.5 g/dL (ref 30.0–36.0)
MCV: 86.4 fL (ref 80.0–100.0)
Platelets: 358 10*3/uL (ref 150–400)
RBC: 3.91 MIL/uL (ref 3.87–5.11)
RDW: 15.4 % (ref 11.5–15.5)
WBC: 15.3 10*3/uL — ABNORMAL HIGH (ref 4.0–10.5)
nRBC: 0 % (ref 0.0–0.2)

## 2022-02-06 LAB — LIPASE, BLOOD: Lipase: 461 U/L — ABNORMAL HIGH (ref 11–51)

## 2022-02-06 NOTE — Progress Notes (Signed)
[facility] Progress Note  Patient: [name]     LZJ:[id number]  DOA: 02/05/2022   PCP: Samuel Bouche, NP       Brief hospital course: This is a 76 year old female with hypertension, osteoarthritis, anxiety and depression who presents to the hospital for abdominal pain, heartburn nausea diarrhea and fatigue. In the ED she was found to have a WBC count of 15.6, creatinine 1.14, lipase 8407 and potassium of 3.4. Treated with antiemetics IV fluids and IV antibiotics. Imaging revealed a common bile duct stone and inflammation of the pancreatic head and second and third portion of the duodenum. GI was consulted for gallstone pancreatitis.  Subjective:  She states that she "feels bad".  She continues to have abdominal pain and just received some pain medication not too long ago.  She has nausea but no vomiting.  Assessment and Plan: Principal Problem:   Acute gallstone pancreatitis, leukocytosis -Lipase has improved to 461 but she continues to have abdominal discomfort - MRCP has been ordered and is pending - Continue IV fluids, follow-up on MRCP and lab work  Active Problems: Elevated creatinine - Creatinine 1.14 when admitted - Has improved to normal today  Hypokalemia - Potassium 3.4-replaced and normalized  Metabolic acidosis - Bicarb was 19 and is now 20 - Continue to follow    Essential hypertension - Continue amlodipine, benazepril and metoprolol V as needed      Code Status: Full Code Consultants: GI Level of Care: Level of care: Med-Surg Total time on patient care: 35 minutes DVT prophylaxis:  SCDs Start: 02/05/22 1456     Objective:   Vitals:   02/05/22 1754 02/05/22 1852 02/05/22 2159 02/06/22 0608  BP:  (!) 149/70 (!) 142/53 (!) 145/58  Pulse:  96 81 83  Resp:  '20 19 19  '$ Temp:  99 F (37.2 C) 98.5 F (36.9 C) 98.3 F (36.8 C)  TempSrc:  Oral    SpO2:  99% 99% 97%  Weight: 77.1 kg     Height: '5\' 2"'$  (1.575 m)      Filed Weights    02/05/22 1754  Weight: 77.1 kg   Exam: General exam: Appears comfortable  HEENT: oral mucosa moist Respiratory system: Clear to auscultation.  Cardiovascular system: S1 & S2 heard  Gastrointestinal system: Abdomen soft, mild tenderness in upper abdomen, nondistended. Normal bowel sounds   Extremities: No cyanosis, clubbing or edema Psychiatry:  Mood & affect appropriate.      CBC: Recent Labs  Lab 02/05/22 1004 02/06/22 0639  WBC 15.6* 15.3*  HGB 12.1 11.0*  HCT 35.5* 33.8*  MCV 83.7 86.4  PLT 405* 024   Basic Metabolic Panel: Recent Labs  Lab 02/05/22 1004  NA 137  K 3.4*  CL 103  CO2 19*  GLUCOSE 152*  BUN 29*  CREATININE 1.14*  CALCIUM 9.0  MG 1.8  PHOS 4.1   GFR: Estimated Creatinine Clearance: 39.7 mL/min (A) (by C-G formula based on SCr of 1.14 mg/dL (H)).  Scheduled Meds:  amLODipine  5 mg Oral Daily   And   benazepril  20 mg Oral Daily   atorvastatin  20 mg Oral Daily   pantoprazole (PROTONIX) IV  40 mg Intravenous Q24H   venlafaxine XR  150 mg Oral Q breakfast   Continuous Infusions:  0.9 % NaCl with KCl 20 mEq / L 100 mL/hr at 02/06/22 0645   cefTRIAXone (ROCEPHIN)  IV     Imaging and lab data was personally reviewed CT Abdomen Pelvis  W Contrast  Result Date: 02/05/2022 CLINICAL DATA:  Abdominal pain.  Elevated lipase. EXAM: CT ABDOMEN AND PELVIS WITH CONTRAST TECHNIQUE: Multidetector CT imaging of the abdomen and pelvis was performed using the standard protocol following bolus administration of intravenous contrast. RADIATION DOSE REDUCTION: This exam was performed according to the departmental dose-optimization program which includes automated exposure control, adjustment of the mA and/or kV according to patient size and/or use of iterative reconstruction technique. CONTRAST:  28m OMNIPAQUE IOHEXOL 300 MG/ML  SOLN COMPARISON:  None Available. FINDINGS: Lower chest: No acute findings. Hepatobiliary: Normal liver. Gallbladder is distended,  without evidence of stones or wall thickening. Common bile duct is normal in caliber. There is a small stone, 2-3 mm, that projects at the ampullary of Vater. Pancreas: Mild dilation of the pancreatic duct, maximum 4 mm. There is subtle inflammatory changes adjacent to the pancreatic head and second and third portions of the duodenum, along the adjacent anterior right pararenal fascia. Remainder of the pancreas is unremarkable. No pancreatic mass. No peripancreatic fluid collection. Spleen: Normal in size without focal abnormality. Adrenals/Urinary Tract: Normal adrenal glands. Kidneys normal in size, orientation and position with symmetric enhancement and excretion. Small low-attenuation lesions posterior upper pole the right kidney, anterior lower pole of the left kidney, too small to fully characterize, but consistent with cysts. No follow-up recommended. No renal stones. No hydronephrosis. Normal ureters. Normal bladder. Stomach/Bowel: Stomach decompressed, otherwise unremarkable. Small bowel and colon are normal in caliber. No wall thickening. No inflammation. Normal appendix visualized. Vascular/Lymphatic: Aortic atherosclerosis. No aneurysm. No enlarged lymph nodes. Reproductive: Uterus normal in size. Lower attenuation endometrium is prominent measuring 9 mm in thickness. No adnexal masses. Other: No abdominal wall hernia or abnormality. No abdominopelvic ascites. Musculoskeletal: No fracture or acute finding.  No bone lesion. IMPRESSION: 1. 2-3 mm stone projects in the distal common bile duct at the level of the ampulla of Vater. No common bile duct dilation or intrahepatic bile duct dilation. Mild dilation of the pancreatic duct. There are subtle inflammatory changes adjacent to the pancreatic head and second and third portion of the duodenum. Suspect minimal uncomplicated pancreatitis. 2. No other evidence of an acute abnormality within the abdomen or pelvis. 3. Low-attenuation endometrium appears mildly  thickened for this patient's age. Consider nonemergent follow-up pelvic ultrasound. 4. Aortic atherosclerosis. Electronically Signed   By: DLajean ManesM.D.   On: 02/05/2022 12:37    LOS: 1 day   Author: SDebbe Odea 02/06/2022 8:29 AM  To contact [facility]>   Check the care team in [facility] look for the attending/consulting [place] listed  Log into [email or web address] and use [place]'s universal password   Go to> "[facility]"  and find provider  If you still have difficulty reaching the provider, please page the [facility](Director on Call) for the Hospitalists listed on amion

## 2022-02-06 NOTE — TOC Initial Note (Signed)
Transition of Care [facility]) - Initial/Assessment Note    Patient Details  Name: Emine Lopata MRN: [id number] Date of Birth: [birth year]  Transition of Care [facility]) CM/SW Contact:    Leeroy Cha, RN Phone Number: 02/06/2022, 9:19 AM  Clinical Narrative:                  Transition of Care [facility]) Screening Note   Patient Details  Name: Destynee Stringfellow Date of Birth: 03/22/1945   Transition of Care [facility]) CM/SW Contact:    Leeroy Cha, RN Phone Number: 02/06/2022, 9:20 AM    Transition of Care Department [facility]) has reviewed patient and no TOC needs have been identified at this time. We will continue to monitor patient advancement through interdisciplinary progression rounds. If new patient transition needs arise, please place a TOC consult.    Expected Discharge Plan: Home/Self Care Barriers to Discharge: Continued Medical Work up   Patient Goals and CMS Choice Patient states their goals for this hospitalization and ongoing recovery are:: to go home CMS Medicare.gov Compare Post Acute Care list provided to:: Patient        Expected Discharge Plan and Services   Discharge Planning Services: CM Consult   Living arrangements for the past 2 months: Single Family Home                                      Prior Living Arrangements/Services Living arrangements for the past 2 months: Single Family Home Lives with:: Spouse Patient language and need for interpreter reviewed:: Yes              Criminal Activity/Legal Involvement Pertinent to Current Situation/Hospitalization: No - Comment as needed  Activities of Daily Living Home Assistive Devices/Equipment: Eyeglasses ADL Screening (condition at time of admission) Patient's cognitive ability adequate to safely complete daily activities?: Yes Is the patient deaf or have difficulty hearing?: Yes Does the patient have difficulty seeing, even when wearing glasses/contacts?: No Does the  patient have difficulty concentrating, remembering, or making decisions?: No Patient able to express need for assistance with ADLs?: Yes Does the patient have difficulty dressing or bathing?: No Independently performs ADLs?: Yes (appropriate for developmental age) Does the patient have difficulty walking or climbing stairs?: No Weakness of Legs: None Weakness of Arms/Hands: None  Permission Sought/Granted                  Emotional Assessment Appearance:: Appears stated age Attitude/Demeanor/Rapport: Engaged Affect (typically observed): Calm Orientation: : Oriented to Self, Oriented to Place, Oriented to  Time, Oriented to Situation Alcohol / Substance Use: Never Used Psych Involvement: No (comment)  Admission diagnosis:  Acute gallstone pancreatitis [K85.10] Patient Active Problem List   Diagnosis Date Noted   Acute gallstone pancreatitis 02/05/2022   Aortic atherosclerosis ([place]) 02/05/2022   Facial lesion 09/30/2021   Bilateral hand pain 09/30/2021   Impingement syndrome, shoulder, left 02/16/2021   Impingement syndrome, shoulder, right 09/07/2020   MDD (major depressive disorder) 09/02/2019   Generalized anxiety disorder 09/02/2019   Primary insomnia 08/16/2019   Post-menopausal 05/14/2017   Prediabetes 01/12/2017   Snoring 07/20/2016   Obsessive-compulsive personality trait 12/07/2015   Primary localized osteoarthritis of right knee    Essential hypertension 08/04/2015   Hyperlipidemia 08/04/2015   Osteopenia 08/04/2015   Anxiety associated with depression 08/04/2015   PCP:  Samuel Bouche, NP Pharmacy:   [id number] -  [place], [place] [address] Phone: [phone] Fax: [phone]  [facility] [id number] [place], [place] [address] [place] [address] Phone: [phone] Fax: [phone]     Social  Determinants of Health (SDOH) Social History: SDOH Screenings   Food Insecurity: No Food Insecurity (02/05/2022)  Housing: Low Risk  (02/05/2022)  Transportation Needs: No Transportation Needs (02/05/2022)  Utilities: Not At Risk (02/05/2022)  Alcohol Screen: Low Risk  (02/27/2020)  Depression (PHQ2-9): High Risk (11/11/2021)  Financial Resource Strain: Low Risk  (02/27/2020)  Physical Activity: Inactive (02/27/2020)  Social Connections: Moderately Isolated (02/27/2020)  Stress: No Stress Concern Present (02/27/2020)  Tobacco Use: Low Risk  (02/05/2022)   SDOH Interventions: Housing Interventions: Intervention Not Indicated   Readmission Risk Interventions   No data to display

## 2022-02-06 NOTE — Consult Note (Signed)
[facility] Gastroenterology Consult  Referring Provider: No ref. provider found Primary Care Physician:  Samuel Bouche, NP Primary Gastroenterologist: None, unassigned  Reason for Consultation: gallstone pancreatitis   SUBJECTIVE:   HPI: [name] is a 76 y.o. female with past medical history significant for hypertension, hyperlipidemia and osteopenia.  She noted having 2-week history of progressively worsening right upper quadrant abdominal pain.  No nausea or vomiting.  No fevers or chills.  No chest pain or shortness of breath.  She denied prior abdominal surgeries, no prior gastric bypass.  Labs on presentation showed lipase 8407, AST/ALT 29/30, alkaline phosphatase 89, total bilirubin 0.4, BUN/creatinine 29/1.14, WBC 15.6, hemoglobin 12.1, platelet 405.  CT scan of abdomen pelvis with contrast showed distended gallbladder no evidence of gallstones, common bile duct normal caliber, small 2-3 mm stone at ampulla of Vater, mild pancreatic duct dilatation 4 mm, subtle inflammatory changes adjacent to the pancreatic head and second and third portion of duodenum.  Past Medical History:  Diagnosis Date   Anxiety    Depression    Headache    SINUS   Hyperlipidemia    Hypertension    Osteopenia    Primary localized osteoarthritis of right knee    Past Surgical History:  Procedure Laterality Date   BREAST EXCISIONAL BIOPSY Right    benign cyst removal    CARPAL TUNNEL RELEASE Right 2015   Ortho in [place] Right 2011   Orthopedic in [place] Right 09/20/2015   TOTAL KNEE ARTHROPLASTY Right 09/20/2015   Procedure: TOTAL KNEE ARTHROPLASTY;  Surgeon: Elsie Saas, MD;  Location: [facility];  Service: Orthopedics;  Laterality: Right;   Trigger Thumb Right 2015   Done in [place] at the same time as the carpal tunnel release   TUBAL LIGATION  1980   Prior to Admission medications   Medication Sig Start Date End Date Taking? Authorizing  Provider  amLODipine-benazepril (LOTREL) 5-20 MG capsule Take 1 capsule by mouth daily. 06/30/21  Yes Jessup, Joy, NP  atorvastatin (LIPITOR) 20 MG tablet TAKE ONE TABLET BY MOUTH ONE TIME A DAY Patient taking differently: Take 20 mg by mouth daily. 06/30/21  Yes Jessup, Joy, NP  clonazePAM (KLONOPIN) 0.5 MG tablet TAKE 1/2 TO 1 TABLET BY MOUTH TWO TIMES A DAY AS NEEDED Patient taking differently: Take 0.25-0.5 mg by mouth 2 (two) times daily as needed for anxiety. 12/30/21  Yes Samuel Bouche, NP  fluticasone (FLONASE) 50 MCG/ACT nasal spray Place 1 spray into both nostrils daily as needed for allergies or rhinitis.   Yes [provider]  raloxifene (EVISTA) 60 MG tablet TAKE 1 TABLET BY MOUTH DAILY 01/12/22  Yes Jessup, Joy, NP  triamterene-hydrochlorothiazide (DYAZIDE) 37.5-25 MG capsule TAKE ONE CAPSULE BY MOUTH ONE TIME A DAY Patient taking differently: Take 1 capsule by mouth daily. 06/30/21  Yes Samuel Bouche, NP  venlafaxine XR (EFFEXOR-XR) 150 MG 24 hr capsule Take 1 capsule (150 mg total) by mouth daily with breakfast. 10/19/21  Yes Eulis Canner E, NP  zolpidem (AMBIEN) 5 MG tablet Take 1.5 tablets (7.5 mg total) by mouth at bedtime as needed for sleep. 01/15/22 03/16/22 Yes Bahraini, Sarah A   Current Facility-Administered Medications  Medication Dose Route Frequency Provider Last Rate Last Admin   0.9 % NaCl with KCl 20 mEq/ L  infusion   Intravenous Continuous Reubin Milan, MD 100 mL/hr at 02/06/22 0645 Infusion Verify at 02/06/22 [phone]  acetaminophen (TYLENOL) tablet 650 mg  650 mg Oral Q6H PRN Reubin Milan, MD       Or   acetaminophen (TYLENOL) suppository 650 mg  650 mg Rectal Q6H PRN Reubin Milan, MD       amLODipine [facility]) tablet 5 mg  5 mg Oral Daily Reubin Milan, MD       And   benazepril (LOTENSIN) tablet 20 mg  20 mg Oral Daily Reubin Milan, MD       atorvastatin (LIPITOR) tablet 20 mg  20 mg Oral Daily Reubin Milan, MD        cefTRIAXone (ROCEPHIN) 2 g in sodium chloride 0.9 % 100 mL IVPB  2 g Intravenous Q24H Reubin Milan, MD       HYDROmorphone (DILAUDID) injection 0.5 mg  0.5 mg Intravenous Q2H PRN Reubin Milan, MD   0.5 mg at 02/06/22 1448   metoprolol tartrate (LOPRESSOR) injection 5 mg  5 mg Intravenous Q8H PRN Reubin Milan, MD       ondansetron [facility]) tablet 4 mg  4 mg Oral Q6H PRN Reubin Milan, MD       Or   ondansetron [facility]) injection 4 mg  4 mg Intravenous Q6H PRN Reubin Milan, MD       pantoprazole (PROTONIX) injection 40 mg  40 mg Intravenous Q24H Reubin Milan, MD   40 mg at 02/05/22 1516   venlafaxine XR (EFFEXOR-XR) 24 hr capsule 150 mg  150 mg Oral Q breakfast Reubin Milan, MD   150 mg at 02/06/22 1856   Allergies as of 02/05/2022 - Review Complete 02/05/2022  Allergen Reaction Noted   Penicillins Hives 06/14/2015   Prednisone Other (See Comments) 12/26/2021   Sulfa antibiotics Hives 06/14/2015   Family History  Problem Relation Age of Onset   Heart failure Mother    Heart attack Mother    Heart attack Father    Irregular heart beat Sister    Hypertension Sister    Heart disease Brother    Alcohol abuse Maternal Aunt    Social History   Socioeconomic History   Marital status: Married    Spouse name: Purcell Nails   Number of children: 1   Years of education: 17.5   Highest education level: Master's degree (e.g., MA, MS, MEng, MEd, MSW, MBA)  Occupational History   Occupation: retired    Comment: Licensed conveyancer  Tobacco Use   Smoking status: Never   Smokeless tobacco: Never  Vaping Use   Vaping Use: Never used  Substance and Sexual Activity   Alcohol use: No    Comment: 1 glass a month   Drug use: No   Sexual activity: Yes  Other Topics Concern   Not on file  Social History Narrative   Reads paper, walks, housework errands. Caffeine use daily   Social Determinants of Health   Financial Resource Strain: Low Risk  (02/27/2020)    Overall Financial Resource Strain (CARDIA)    Difficulty of Paying Living Expenses: Not hard at all  Food Insecurity: No Food Insecurity (02/05/2022)   Hunger Vital Sign    Worried About Running Out of Food in the Last Year: Never true    Ran Out of Food in the Last Year: Never true  Transportation Needs: No Transportation Needs (02/05/2022)   PRAPARE - Hydrologist (Medical): No    Lack of Transportation (Non-Medical): No  Physical Activity: Inactive (02/27/2020)  Exercise Vital Sign    Days of Exercise per Week: 0 days    Minutes of Exercise per Session: 0 min  Stress: No Stress Concern Present (02/27/2020)   [place]    Feeling of Stress : Not at all  Social Connections: Moderately Isolated (02/27/2020)   Social Connection and Isolation Panel [NHANES]    Frequency of Communication with Friends and Family: Three times a week    Frequency of Social Gatherings with Friends and Family: Once a week    Attends Religious Services: Never    Marine scientist or Organizations: No    Attends Archivist Meetings: Never    Marital Status: Married  Human resources officer Violence: Not At Risk (02/05/2022)   Humiliation, Afraid, Rape, and Kick questionnaire    Fear of Current or Ex-Partner: No    Emotionally Abused: No    Physically Abused: No    Sexually Abused: No   Review of Systems:  Review of Systems  Constitutional:  Negative for chills and fever.  Respiratory:  Negative for shortness of breath.   Cardiovascular:  Negative for chest pain.  Gastrointestinal:  Positive for abdominal pain, constipation, diarrhea and nausea. Negative for blood in stool.    OBJECTIVE:   Temp:  [98 F (36.7 C)-99 F (37.2 C)] 98.3 F (36.8 C) (12/25 8502) Pulse Rate:  [81-102] 83 (12/25 0608) Resp:  [16-20] 19 (12/25 0608) BP: (142-177)/(53-78) 145/58 (12/25 0608) SpO2:  [95 %-100 %] 97 % (12/25  7741) Weight:  [77.1 kg] 77.1 kg (12/24 1754) Last BM Date : 02/05/22 Physical Exam Constitutional:      General: She is not in acute distress.    Appearance: She is not ill-appearing, toxic-appearing or diaphoretic.  HENT:     Mouth/Throat:     Mouth: Mucous membranes are dry.  Cardiovascular:     Rate and Rhythm: Normal rate and regular rhythm.  Pulmonary:     Effort: No respiratory distress.     Breath sounds: Normal breath sounds.     Comments: No supplemental oxygen in place at time of exam. Abdominal:     General: Bowel sounds are normal. There is no distension.     Palpations: Abdomen is soft.     Tenderness: There is abdominal tenderness (Right upper quadrant). There is no guarding.  Musculoskeletal:     Right lower leg: No edema.     Left lower leg: No edema.  Skin:    General: Skin is warm and dry.  Neurological:     Mental Status: She is alert.     Labs: Recent Labs    02/05/22 1004 02/06/22 0639  WBC 15.6* 15.3*  HGB 12.1 11.0*  HCT 35.5* 33.8*  PLT 405* 358   BMET Recent Labs    02/05/22 1004  NA 137  K 3.4*  CL 103  CO2 19*  GLUCOSE 152*  BUN 29*  CREATININE 1.14*  CALCIUM 9.0   LFT Recent Labs    02/05/22 1004  PROT 7.6  ALBUMIN 4.4  AST 29  ALT 30  ALKPHOS 89  BILITOT 0.4   PT/INR No results for input(s): "LABPROT", "INR" in the last 72 hours.  Diagnostic imaging: CT Abdomen Pelvis W Contrast  Result Date: 02/05/2022 CLINICAL DATA:  Abdominal pain.  Elevated lipase. EXAM: CT ABDOMEN AND PELVIS WITH CONTRAST TECHNIQUE: Multidetector CT imaging of the abdomen and pelvis was performed using the standard protocol following bolus administration of  intravenous contrast. RADIATION DOSE REDUCTION: This exam was performed according to the departmental dose-optimization program which includes automated exposure control, adjustment of the mA and/or kV according to patient size and/or use of iterative reconstruction technique. CONTRAST:  79m  OMNIPAQUE IOHEXOL 300 MG/ML  SOLN COMPARISON:  None Available. FINDINGS: Lower chest: No acute findings. Hepatobiliary: Normal liver. Gallbladder is distended, without evidence of stones or wall thickening. Common bile duct is normal in caliber. There is a small stone, 2-3 mm, that projects at the ampullary of Vater. Pancreas: Mild dilation of the pancreatic duct, maximum 4 mm. There is subtle inflammatory changes adjacent to the pancreatic head and second and third portions of the duodenum, along the adjacent anterior right pararenal fascia. Remainder of the pancreas is unremarkable. No pancreatic mass. No peripancreatic fluid collection. Spleen: Normal in size without focal abnormality. Adrenals/Urinary Tract: Normal adrenal glands. Kidneys normal in size, orientation and position with symmetric enhancement and excretion. Small low-attenuation lesions posterior upper pole the right kidney, anterior lower pole of the left kidney, too small to fully characterize, but consistent with cysts. No follow-up recommended. No renal stones. No hydronephrosis. Normal ureters. Normal bladder. Stomach/Bowel: Stomach decompressed, otherwise unremarkable. Small bowel and colon are normal in caliber. No wall thickening. No inflammation. Normal appendix visualized. Vascular/Lymphatic: Aortic atherosclerosis. No aneurysm. No enlarged lymph nodes. Reproductive: Uterus normal in size. Lower attenuation endometrium is prominent measuring 9 mm in thickness. No adnexal masses. Other: No abdominal wall hernia or abnormality. No abdominopelvic ascites. Musculoskeletal: No fracture or acute finding.  No bone lesion. IMPRESSION: 1. 2-3 mm stone projects in the distal common bile duct at the level of the ampulla of Vater. No common bile duct dilation or intrahepatic bile duct dilation. Mild dilation of the pancreatic duct. There are subtle inflammatory changes adjacent to the pancreatic head and second and third portion of the duodenum.  Suspect minimal uncomplicated pancreatitis. 2. No other evidence of an acute abnormality within the abdomen or pelvis. 3. Low-attenuation endometrium appears mildly thickened for this patient's age. Consider nonemergent follow-up pelvic ultrasound. 4. Aortic atherosclerosis. Electronically Signed   By: DLajean ManesM.D.   On: 02/05/2022 12:37    IMPRESSION: Gallstone pancreatitis Abnormal CT imaging Abdominal pain secondary to above Hypertension Hyperlipidemia Osteopenia  PLAN: -Encouraged that liver enzymes are unremarkable and total bilirubin is unremarkable as well, no common bile duct dilatation -Check MRCP without gadolinium today -If choledocholithiasis is evidenced we will plan for ERCP 02/07/2022 with Dr. MClarene Essex-Supportive management for pancreatitis including n.p.o., IV fluids and pain control per primary team -Can provide diet after MRCP testing is completed (clear liquid diet or low-fat diet, as patient requests) -Sadie HaberGI will follow   LOS: 1 day   CDanton Clap [facility]

## 2022-02-07 ENCOUNTER — Encounter (HOSPITAL_COMMUNITY)
Admission: EM | Disposition: A | Discharge status: Home / Self Care | Payer: Self-pay | Source: Home / Self Care | Attending: Internal Medicine

## 2022-02-07 ENCOUNTER — Ambulatory Visit: Payer: Medicare Other | Admitting: Medical-Surgical

## 2022-02-07 ENCOUNTER — Inpatient Hospital Stay (HOSPITAL_COMMUNITY): Payer: Medicare Other

## 2022-02-07 DIAGNOSIS — K851 Biliary acute pancreatitis without necrosis or infection: Secondary | ICD-10-CM | POA: Diagnosis not present

## 2022-02-07 LAB — COMPREHENSIVE METABOLIC PANEL
ALT: 25 U/L (ref 0–44)
AST: 31 U/L (ref 15–41)
Albumin: 3.5 g/dL (ref 3.5–5.0)
Alkaline Phosphatase: 78 U/L (ref 38–126)
Anion gap: 10 (ref 5–15)
BUN: 16 mg/dL (ref 8–23)
CO2: 19 mmol/L — ABNORMAL LOW (ref 22–32)
Calcium: 8.6 mg/dL — ABNORMAL LOW (ref 8.9–10.3)
Chloride: 112 mmol/L — ABNORMAL HIGH (ref 98–111)
Creatinine, Ser: 0.79 mg/dL (ref 0.44–1.00)
GFR, Estimated: >60 mL/min (ref >60)
Glucose, Bld: 97 mg/dL (ref 70–99)
Potassium: 3.9 mmol/L (ref 3.5–5.1)
Sodium: 141 mmol/L (ref 135–145)
Total Bilirubin: 0.7 mg/dL (ref 0.3–1.2)
Total Protein: 7.1 g/dL (ref 6.5–8.1)

## 2022-02-07 LAB — GLUCOSE, CAPILLARY: Glucose-Capillary: 84 mg/dL (ref 70–99)

## 2022-02-07 LAB — CBC
HCT: 31.7 % — ABNORMAL LOW (ref 36.0–46.0)
Hemoglobin: 10.2 g/dL — ABNORMAL LOW (ref 12.0–15.0)
MCH: 28.4 pg (ref 26.0–34.0)
MCHC: 32.2 g/dL (ref 30.0–36.0)
MCV: 88.3 fL (ref 80.0–100.0)
Platelets: 347 10*3/uL (ref 150–400)
RBC: 3.59 MIL/uL — ABNORMAL LOW (ref 3.87–5.11)
RDW: 15.4 % (ref 11.5–15.5)
WBC: 13.6 10*3/uL — ABNORMAL HIGH (ref 4.0–10.5)
nRBC: 0 % (ref 0.0–0.2)

## 2022-02-07 LAB — LIPASE, BLOOD: Lipase: 83 U/L — ABNORMAL HIGH (ref 11–51)

## 2022-02-07 SURGERY — ERCP, WITH INTERVENTION IF INDICATED
Anesthesia: General

## 2022-02-07 MED ORDER — HYDROXYZINE HCL 10 MG PO TABS: 10.0000 mg | ORAL_TABLET | Freq: Two times a day (BID) | ORAL | Status: DC | PRN

## 2022-02-07 MED ORDER — GADOBUTROL 1 MMOL/ML IV SOLN
7.5000 mL | Freq: Once | INTRAVENOUS | Status: AC | PRN
  Administered 2022-02-07: 7.5 mL via INTRAVENOUS

## 2022-02-07 MED ORDER — ZOLPIDEM TARTRATE 5 MG PO TABS: 5.0000 mg | ORAL_TABLET | Freq: Every evening | ORAL | Status: DC | PRN

## 2022-02-07 MED ORDER — ZOLPIDEM TARTRATE 5 MG PO TABS
7.5000 mg | ORAL_TABLET | Freq: Every evening | ORAL | Status: DC | PRN
  Administered 2022-02-07 – 2022-02-08 (×2): 7.5 mg via ORAL
  Filled 2022-02-07 (×2): qty 2

## 2022-02-07 MED ORDER — LORAZEPAM 2 MG/ML IJ SOLN
0.5000 mg | Freq: Once | INTRAMUSCULAR | Status: AC
  Administered 2022-02-07: 0.5 mg via INTRAVENOUS
  Filled 2022-02-07: qty 1

## 2022-02-07 MED ORDER — GADOBUTROL 1 MMOL/ML IV SOLN: 7.7000 mL | Freq: Once | INTRAVENOUS | Status: DC | PRN

## 2022-02-07 NOTE — Progress Notes (Signed)
[facility] Progress Note  Patient: [name]     ZOX:[id number]  DOA: 02/05/2022   PCP: Samuel Bouche, NP       Brief hospital course: This is a 76 year old female with hypertension, osteoarthritis, anxiety and depression who presents to the hospital for abdominal pain, heartburn nausea diarrhea and fatigue. In the ED she was found to have a WBC count of 15.6, creatinine 1.14, lipase 8407 and potassium of 3.4. Treated with antiemetics IV fluids and IV antibiotics. Imaging revealed a common bile duct stone and inflammation of the pancreatic head and second and third portion of the duodenum. GI was consulted for gallstone pancreatitis.  Subjective:  She still feels bad today and has pain in her RUQ. No nausea or vomiting.   Assessment and Plan: Principal Problem:   Acute gallstone pancreatitis, leukocytosis -Lipase has improved to 461 but she continues to have abdominal discomfort - MRCP does not show CBD obstruction - will contact general surgery for lap chole -  Lipase improving and now 83 - Leukocytosis improved from 15 to 13  Active Problems: Elevated creatinine - Creatinine 1.14 when admitted - Has improved to normal   Hypokalemia - Potassium 3.4-replaced and normalized  Metabolic acidosis - Bicarb was 19 - following    Essential hypertension - Continue amlodipine, benazepril and metoprolol V as needed      Code Status: Full Code Consultants: GI Level of Care: Level of care: Med-Surg Total time on patient care: 35 minutes DVT prophylaxis:  SCDs Start: 02/05/22 1456     Objective:   Vitals:   02/06/22 1314 02/06/22 1934 02/07/22 0422 02/07/22 1015  BP: (!) 164/61 (!) 153/67 139/62 (!) 163/65  Pulse: 88 85 80 84  Resp: '18 18 18 '$ (!) 22  Temp: 98.7 F (37.1 C) 98.4 F (36.9 C) 97.6 F (36.4 C) 98.1 F (36.7 C)  TempSrc: Oral Oral Oral Oral  SpO2: 98% 98% 98% 99%  Weight:      Height:       Filed Weights   02/05/22 1754  Weight:  77.1 kg   Exam: General exam: Appears comfortable  HEENT: oral mucosa moist Respiratory system: Clear to auscultation.  Cardiovascular system: S1 & S2 heard  Gastrointestinal system: Abdomen soft, RUQ tenderness, nondistended. Normal bowel sounds   Extremities: No cyanosis, clubbing or edema Psychiatry:  Mood & affect appropriate.      CBC: Recent Labs  Lab 02/05/22 1004 02/06/22 0639 02/07/22 0523  WBC 15.6* 15.3* 13.6*  HGB 12.1 11.0* 10.2*  HCT 35.5* 33.8* 31.7*  MCV 83.7 86.4 88.3  PLT 405* 358 811    Basic Metabolic Panel: Recent Labs  Lab 02/05/22 1004 02/06/22 0639 02/07/22 0523  NA 137 142 141  K 3.4* 4.0 3.9  CL 103 112* 112*  CO2 19* 20* 19*  GLUCOSE 152* 112* 97  BUN 29* 18 16  CREATININE 1.14* 0.94 0.79  CALCIUM 9.0 8.6* 8.6*  MG 1.8  --   --   PHOS 4.1  --   --     GFR: Estimated Creatinine Clearance: 56.6 mL/min (by C-G formula based on SCr of 0.79 mg/dL).  Scheduled Meds:  amLODipine  5 mg Oral Daily   And   benazepril  20 mg Oral Daily   atorvastatin  20 mg Oral Daily   pantoprazole (PROTONIX) IV  40 mg Intravenous Q24H   venlafaxine XR  150 mg Oral Q breakfast   Continuous Infusions:  0.9 % NaCl with KCl 20 mEq /  L 100 mL/hr at 02/07/22 0335   cefTRIAXone (ROCEPHIN)  IV 2 g (02/06/22 1702)   Imaging and lab data was personally reviewed MR ABDOMEN MRCP W WO CONTAST  Result Date: 02/07/2022 CLINICAL DATA:  Pancreatitis. Possible choledocholithiasis on recent CT. EXAM: MRI ABDOMEN WITHOUT AND WITH CONTRAST (INCLUDING MRCP) TECHNIQUE: Multiplanar multisequence MR imaging of the abdomen was performed both before and after the administration of intravenous contrast. Heavily T2-weighted images of the biliary and pancreatic ducts were obtained, and three-dimensional MRCP images were rendered by post processing. CONTRAST:  7.76m GADAVIST GADOBUTROL 1 MMOL/ML IV SOLN COMPARISON:  CT on 02/05/2022 FINDINGS: Lower chest: No acute findings.  Hepatobiliary: No hepatic masses identified. Gallbladder is unremarkable. No evidence of biliary ductal dilatation, with common bile duct measuring 4 mm in diameter. No evidence of choledocholithiasis. Pancreas: Mild peripancreatic edema is seen, consistent with acute pancreatitis. No evidence of pancreatic necrosis, pseudocyst, or mass. No evidence of pancreatic ductal dilatation or pancreas divisum. Spleen:  Within normal limits in size and appearance. Adrenals/Urinary Tract: No suspicious masses identified. No evidence of hydronephrosis. Stomach/Bowel: Unremarkable. Vascular/Lymphatic: No pathologically enlarged lymph nodes identified. No acute vascular findings. Other:  None. Musculoskeletal:  No suspicious bone lesions identified. IMPRESSION: Mild acute pancreatitis. No evidence of pancreatic necrosis, pseudocyst, or other complication. No evidence of biliary ductal dilatation or choledocholithiasis. Electronically Signed   By: JMarlaine HindM.D.   On: 02/07/2022 10:30   MR 3D Recon At Scanner  Result Date: 02/07/2022 CLINICAL DATA:  Pancreatitis. Possible choledocholithiasis on recent CT. EXAM: MRI ABDOMEN WITHOUT AND WITH CONTRAST (INCLUDING MRCP) TECHNIQUE: Multiplanar multisequence MR imaging of the abdomen was performed both before and after the administration of intravenous contrast. Heavily T2-weighted images of the biliary and pancreatic ducts were obtained, and three-dimensional MRCP images were rendered by post processing. CONTRAST:  7.590mGADAVIST GADOBUTROL 1 MMOL/ML IV SOLN COMPARISON:  CT on 02/05/2022 FINDINGS: Lower chest: No acute findings. Hepatobiliary: No hepatic masses identified. Gallbladder is unremarkable. No evidence of biliary ductal dilatation, with common bile duct measuring 4 mm in diameter. No evidence of choledocholithiasis. Pancreas: Mild peripancreatic edema is seen, consistent with acute pancreatitis. No evidence of pancreatic necrosis, pseudocyst, or mass. No evidence of  pancreatic ductal dilatation or pancreas divisum. Spleen:  Within normal limits in size and appearance. Adrenals/Urinary Tract: No suspicious masses identified. No evidence of hydronephrosis. Stomach/Bowel: Unremarkable. Vascular/Lymphatic: No pathologically enlarged lymph nodes identified. No acute vascular findings. Other:  None. Musculoskeletal:  No suspicious bone lesions identified. IMPRESSION: Mild acute pancreatitis. No evidence of pancreatic necrosis, pseudocyst, or other complication. No evidence of biliary ductal dilatation or choledocholithiasis. Electronically Signed   By: JoMarlaine Hind.D.   On: 02/07/2022 10:30    LOS: 2 days   Author: SaDebbe Odea12/26/2023 2:41 PM  To contact [facility]>   Check the care team in [facility] look for the attending/consulting TRSavonarovider listed  Log into [email or web address] and use [place]'s universal password   Go to> "[facility]"  and find provider  If you still have difficulty reaching the provider, please page the DOCinc[facility] CenterDirector on Call) for the Hospitalists listed on amion

## 2022-02-07 NOTE — Consult Note (Addendum)
[facility] Surgery Consult Note  [name] [birth year]  [id number].    Requesting MD: Debbe Odea Chief Complaint/Reason for Consult: gallstone pancreatitis  HPI:  [name] is a 76 y.o. female PMH HTN, HLD, anxiety/depression, osteopenia who presented to [facility] 12/24 complaining of progressive generalized abdominal pain and diarrhea dating back to end of September or early October 2023. Episodes of abdominal disomfort, bloating, nausea and diarrhea became more frequent this month and acutely worse in the last few days. Pain seems to be worst in the RUQ, and worse with PO intake - patient admits that spicy and fatty food make her symptoms, and her reflux, worse. Denies fever or chills. States she had a colonoscopy 10-12 years ago that was normal, since then has had a Cologuard that she says was also normal. Denies history of upper endoscopy or PUD. Denies any recent antibiotic use or new medications.   Patient was worked up by [facility]. Lab work revealed WBC 15.6, lipase 8407, and LFTs WNL. CT a/p reports a 2-3 mm stone in the distal common bile duct; no common bile duct dilation or intrahepatic bile duct dilation; mild dilation of the pancreatic duct; subtle inflammatory changes adjacent to the pancreatic head and second and third portion of the duodenum. MRCP completed today and reports mild acute pancreatitis; no evidence of pancreatic necrosis, pseudocyst, or other complication; no evidence of biliary ductal dilatation or choledocholithiasis. GI has seen with no plans for ERCP at this time. General surgery asked to see for consideration of laparoscopic cholecystectomy.  Today patient states that her pain is improved but still present in her right side. She has not taken any narcotic pain medication since yesterday. Lipase is down to 83 and LFTs remains WNL.   Abdominal surgical history: tubal ligation Anticoagulants: none Nonsmoker Drinks alcohol very rarely Denies  illicit drug use Employment: retired   ROS: As above Review of Systems  All other systems reviewed and are negative.   All systems reviewed and otherwise negative except for as above  Family History  Problem Relation Age of Onset   Heart failure Mother    Heart attack Mother    Heart attack Father    Irregular heart beat Sister    Hypertension Sister    Heart disease Brother    Alcohol abuse Maternal Aunt     Past Medical History:  Diagnosis Date   Anxiety    Depression    Headache    SINUS   Hyperlipidemia    Hypertension    Osteopenia    Primary localized osteoarthritis of right knee     Past Surgical History:  Procedure Laterality Date   BREAST EXCISIONAL BIOPSY Right    benign cyst removal    CARPAL TUNNEL RELEASE Right 2015   Ortho in [place] Right 2011   Orthopedic in [place] Right 09/20/2015   TOTAL KNEE ARTHROPLASTY Right 09/20/2015   Procedure: TOTAL KNEE ARTHROPLASTY;  Surgeon: Elsie Saas, MD;  Location: [facility];  Service: Orthopedics;  Laterality: Right;   Trigger Thumb Right 2015   Done in [place] at the same time as the carpal tunnel release   TUBAL LIGATION  1980    Social History:  reports that she has never smoked. She has never used smokeless tobacco. She reports that she does not drink alcohol and does not use drugs.  Allergies:  Allergies  Allergen Reactions   Penicillins Hives  Tolerates Cephalosporins    Prednisone Other (See Comments)    Hyperactivity, sleep disturbance, confusion   Sulfa Antibiotics Hives         Medications Prior to Admission  Medication Sig Dispense Refill   amLODipine-benazepril (LOTREL) 5-20 MG capsule Take 1 capsule by mouth daily. 90 capsule 3   atorvastatin (LIPITOR) 20 MG tablet TAKE ONE TABLET BY MOUTH ONE TIME A DAY (Patient taking differently: Take 20 mg by mouth daily.) 90 tablet 3   clonazePAM (KLONOPIN) 0.5 MG tablet TAKE 1/2 TO 1 TABLET BY  MOUTH TWO TIMES A DAY AS NEEDED (Patient taking differently: Take 0.25-0.5 mg by mouth 2 (two) times daily as needed for anxiety.) 35 tablet 1   fluticasone (FLONASE) 50 MCG/ACT nasal spray Place 1 spray into both nostrils daily as needed for allergies or rhinitis.     raloxifene (EVISTA) 60 MG tablet TAKE 1 TABLET BY MOUTH DAILY 90 tablet 0   triamterene-hydrochlorothiazide (DYAZIDE) 37.5-25 MG capsule TAKE ONE CAPSULE BY MOUTH ONE TIME A DAY (Patient taking differently: Take 1 capsule by mouth daily.) 90 capsule 3   venlafaxine XR (EFFEXOR-XR) 150 MG 24 hr capsule Take 1 capsule (150 mg total) by mouth daily with breakfast. 90 capsule 3   zolpidem (AMBIEN) 5 MG tablet Take 1.5 tablets (7.5 mg total) by mouth at bedtime as needed for sleep. 45 tablet 1    Prior to Admission medications   Medication Sig Start Date End Date Taking? Authorizing Provider  amLODipine-benazepril (LOTREL) 5-20 MG capsule Take 1 capsule by mouth daily. 06/30/21  Yes Jessup, Joy, NP  atorvastatin (LIPITOR) 20 MG tablet TAKE ONE TABLET BY MOUTH ONE TIME A DAY Patient taking differently: Take 20 mg by mouth daily. 06/30/21  Yes Jessup, Joy, NP  clonazePAM (KLONOPIN) 0.5 MG tablet TAKE 1/2 TO 1 TABLET BY MOUTH TWO TIMES A DAY AS NEEDED Patient taking differently: Take 0.25-0.5 mg by mouth 2 (two) times daily as needed for anxiety. 12/30/21  Yes Samuel Bouche, NP  fluticasone (FLONASE) 50 MCG/ACT nasal spray Place 1 spray into both nostrils daily as needed for allergies or rhinitis.   Yes [provider]  raloxifene (EVISTA) 60 MG tablet TAKE 1 TABLET BY MOUTH DAILY 01/12/22  Yes Jessup, Joy, NP  triamterene-hydrochlorothiazide (DYAZIDE) 37.5-25 MG capsule TAKE ONE CAPSULE BY MOUTH ONE TIME A DAY Patient taking differently: Take 1 capsule by mouth daily. 06/30/21  Yes Samuel Bouche, NP  venlafaxine XR (EFFEXOR-XR) 150 MG 24 hr capsule Take 1 capsule (150 mg total) by mouth daily with breakfast. 10/19/21  Yes Eulis Canner  E, NP  zolpidem (AMBIEN) 5 MG tablet Take 1.5 tablets (7.5 mg total) by mouth at bedtime as needed for sleep. 01/15/22 03/16/22 Yes Bahraini, Sarah A    Blood pressure 136/61, pulse 77, temperature 98.1 F (36.7 C), temperature source Oral, resp. rate (!) 22, height '5\' 2"'$  (1.575 m), weight 77.1 kg, SpO2 100 %. Physical Exam: General: pleasant, WD/WN female who is laying in bed in NAD HEENT: head is normocephalic, atraumatic.  Sclera are noninjected.  Pupils equal and round, anicteric sclerae.  Ears and nose without any masses or lesions.  Mouth is pink and moist. Dentition fair Heart: regular, rate, and rhythm.  Normal s1,s2. No obvious murmurs, gallops, or rubs noted.  Palpable radial and pedal pulses bilaterally  Lungs: CTAB, no wheezes, rhonchi, or rales noted.  Respiratory effort nonlabored Abd: soft, ND, there is tenderness over the right hemiabdomen - worse in RUQ and epigastric  region, +BS, no masses, hernias, or organomegaly MS: no BUE/BLE edema, calves soft and nontender Skin: warm and dry with no masses, lesions, or rashes Psych: A&Ox4 with an appropriate affect Neuro: MAEs, no gross motor or sensory deficits BUE/BLE  Results for orders placed or performed during the hospital encounter of 02/05/22 (from the past 48 hour(s))  Comprehensive metabolic panel     Status: Abnormal   Collection Time: 02/06/22  6:39 AM  Result Value Ref Range   Sodium 142 135 - 145 mmol/L   Potassium 4.0 3.5 - 5.1 mmol/L   Chloride 112 (H) 98 - 111 mmol/L   CO2 20 (L) 22 - 32 mmol/L   Glucose, Bld 112 (H) 70 - 99 mg/dL    Comment: Glucose reference range applies only to samples taken after fasting for at least 8 hours.   BUN 18 8 - 23 mg/dL   Creatinine, Ser 0.94 0.44 - 1.00 mg/dL   Calcium 8.6 (L) 8.9 - 10.3 mg/dL   Total Protein 7.4 6.5 - 8.1 g/dL   Albumin 3.8 3.5 - 5.0 g/dL   AST 29 15 - 41 U/L   ALT 28 0 - 44 U/L   Alkaline Phosphatase 84 38 - 126 U/L   Total Bilirubin 0.6 0.3 - 1.2 mg/dL    GFR, Estimated >60 >60 mL/min    Comment: (NOTE) Calculated using the CKD-EPI Creatinine Equation (2021)    Anion gap 10 5 - 15    Comment: Performed at [facility], [place] [address] [place], [place] [address]  CBC     Status: Abnormal   Collection Time: 02/06/22  6:39 AM  Result Value Ref Range   WBC 15.3 (H) 4.0 - 10.5 K/uL   RBC 3.91 3.87 - 5.11 MIL/uL   Hemoglobin 11.0 (L) 12.0 - 15.0 g/dL   HCT 33.8 (L) 36.0 - 46.0 %   MCV 86.4 80.0 - 100.0 fL   MCH 28.1 26.0 - 34.0 pg   MCHC 32.5 30.0 - 36.0 g/dL   RDW 15.4 11.5 - 15.5 %   Platelets 358 150 - 400 K/uL   nRBC 0.0 0.0 - 0.2 %    Comment: Performed at [facility], [place] [address], [place], [place] [address]  Lipase, blood     Status: Abnormal   Collection Time: 02/06/22  6:39 AM  Result Value Ref Range   Lipase 461 (H) 11 - 51 U/L    Comment: RESULTS CONFIRMED BY MANUAL DILUTION Performed at [facility], [place] [address] [place], [place] [address]   Comprehensive metabolic panel     Status: Abnormal   Collection Time: 02/07/22  5:23 AM  Result Value Ref Range   Sodium 141 135 - 145 mmol/L   Potassium 3.9 3.5 - 5.1 mmol/L   Chloride 112 (H) 98 - 111 mmol/L   CO2 19 (L) 22 - 32 mmol/L   Glucose, Bld 97 70 - 99 mg/dL    Comment: Glucose reference range applies only to samples taken after fasting for at least 8 hours.   BUN 16 8 - 23 mg/dL   Creatinine, Ser 0.79 0.44 - 1.00 mg/dL   Calcium 8.6 (L) 8.9 - 10.3 mg/dL   Total Protein 7.1 6.5 - 8.1 g/dL   Albumin 3.5 3.5 - 5.0 g/dL   AST 31 15 - 41 U/L   ALT 25 0 - 44 U/L   Alkaline Phosphatase 78 38 - 126 U/L   Total Bilirubin 0.7  0.3 - 1.2 mg/dL   GFR, Estimated >60 >60 mL/min    Comment: (NOTE) Calculated using the CKD-EPI Creatinine Equation (2021)    Anion gap 10 5 - 15    Comment: Performed at [facility], [place] [address], [place], [place] [address]  CBC     Status: Abnormal   Collection  Time: 02/07/22  5:23 AM  Result Value Ref Range   WBC 13.6 (H) 4.0 - 10.5 K/uL   RBC 3.59 (L) 3.87 - 5.11 MIL/uL   Hemoglobin 10.2 (L) 12.0 - 15.0 g/dL   HCT 31.7 (L) 36.0 - 46.0 %   MCV 88.3 80.0 - 100.0 fL   MCH 28.4 26.0 - 34.0 pg   MCHC 32.2 30.0 - 36.0 g/dL   RDW 15.4 11.5 - 15.5 %   Platelets 347 150 - 400 K/uL   nRBC 0.0 0.0 - 0.2 %    Comment: Performed at [facility], [place] [address] [place], [place] [address]  Lipase, blood     Status: Abnormal   Collection Time: 02/07/22  5:23 AM  Result Value Ref Range   Lipase 83 (H) 11 - 51 U/L    Comment: Performed at [facility], [place] [address], [place], [place] [address]  Glucose, capillary     Status: None   Collection Time: 02/07/22 10:14 AM  Result Value Ref Range   Glucose-Capillary 84 70 - 99 mg/dL    Comment: Glucose reference range applies only to samples taken after fasting for at least 8 hours.   MR ABDOMEN MRCP W WO CONTAST  Result Date: 02/07/2022 CLINICAL DATA:  Pancreatitis. Possible choledocholithiasis on recent CT. EXAM: MRI ABDOMEN WITHOUT AND WITH CONTRAST (INCLUDING MRCP) TECHNIQUE: Multiplanar multisequence MR imaging of the abdomen was performed both before and after the administration of intravenous contrast. Heavily T2-weighted images of the biliary and pancreatic ducts were obtained, and three-dimensional MRCP images were rendered by post processing. CONTRAST:  7.2m GADAVIST GADOBUTROL 1 MMOL/ML IV SOLN COMPARISON:  CT on 02/05/2022 FINDINGS: Lower chest: No acute findings. Hepatobiliary: No hepatic masses identified. Gallbladder is unremarkable. No evidence of biliary ductal dilatation, with common bile duct measuring 4 mm in diameter. No evidence of choledocholithiasis. Pancreas: Mild peripancreatic edema is seen, consistent with acute pancreatitis. No evidence of pancreatic necrosis, pseudocyst, or mass. No evidence of pancreatic ductal dilatation or pancreas divisum.  Spleen:  Within normal limits in size and appearance. Adrenals/Urinary Tract: No suspicious masses identified. No evidence of hydronephrosis. Stomach/Bowel: Unremarkable. Vascular/Lymphatic: No pathologically enlarged lymph nodes identified. No acute vascular findings. Other:  None. Musculoskeletal:  No suspicious bone lesions identified. IMPRESSION: Mild acute pancreatitis. No evidence of pancreatic necrosis, pseudocyst, or other complication. No evidence of biliary ductal dilatation or choledocholithiasis. Electronically Signed   By: JMarlaine HindM.D.   On: 02/07/2022 10:30   MR 3D Recon At Scanner  Result Date: 02/07/2022 CLINICAL DATA:  Pancreatitis. Possible choledocholithiasis on recent CT. EXAM: MRI ABDOMEN WITHOUT AND WITH CONTRAST (INCLUDING MRCP) TECHNIQUE: Multiplanar multisequence MR imaging of the abdomen was performed both before and after the administration of intravenous contrast. Heavily T2-weighted images of the biliary and pancreatic ducts were obtained, and three-dimensional MRCP images were rendered by post processing. CONTRAST:  7.575mGADAVIST GADOBUTROL 1 MMOL/ML IV SOLN COMPARISON:  CT on 02/05/2022 FINDINGS: Lower chest: No acute findings. Hepatobiliary: No hepatic masses identified. Gallbladder is unremarkable. No evidence of biliary ductal dilatation, with common bile duct measuring 4 mm in diameter.  No evidence of choledocholithiasis. Pancreas: Mild peripancreatic edema is seen, consistent with acute pancreatitis. No evidence of pancreatic necrosis, pseudocyst, or mass. No evidence of pancreatic ductal dilatation or pancreas divisum. Spleen:  Within normal limits in size and appearance. Adrenals/Urinary Tract: No suspicious masses identified. No evidence of hydronephrosis. Stomach/Bowel: Unremarkable. Vascular/Lymphatic: No pathologically enlarged lymph nodes identified. No acute vascular findings. Other:  None. Musculoskeletal:  No suspicious bone lesions identified. IMPRESSION:  Mild acute pancreatitis. No evidence of pancreatic necrosis, pseudocyst, or other complication. No evidence of biliary ductal dilatation or choledocholithiasis. Electronically Signed   By: Marlaine Hind M.D.   On: 02/07/2022 10:30    Anti-infectives (From admission, onward)    Start     Dose/Rate Route Frequency Ordered Stop   02/06/22 1500  cefTRIAXone (ROCEPHIN) 2 g in sodium chloride 0.9 % 100 mL IVPB        2 g 200 mL/hr over 30 Minutes Intravenous Every 24 hours 02/05/22 1918     02/05/22 1345  cefTRIAXone (ROCEPHIN) 1 g in sodium chloride 0.9 % 100 mL IVPB        1 g 200 mL/hr over 30 Minutes Intravenous  Once 02/05/22 1331 02/05/22 1552        Assessment/Plan Gallstone pancreatitis - Patient admitted 12/24 and found to have pancreatitis, likely due to gallstones. CT scan questioned a common bile duct stone but MRCP was negative for this and LFTs remains WNL; GI has seen with no plans for ERCP.  Pain has improved and lipase trending down. Plan for laparoscopic cholecystectomy with +/- IOC once pancreatitis resolves, tentatively plan for tomorrow 12/27 by Dr. Marcello Moores. Will make her NPO after midnight and repeat labs in the AM.  ID - rocephin 12/24>> VTE - SCDs FEN - IVF, CLD Foley - none  HTN HLD Anxiety/depression Osteopenia   I reviewed nursing notes, ED provider notes, Consultant GI notes, last 24 h vitals and pain scores, last 48 h intake and output, last 24 h labs and trends, and last 24 h imaging results.   Obie Dredge, [facility] Surgery 02/07/2022, 3:24 PM Please see Amion for pager number during day hours 7:00am-4:30pm

## 2022-02-07 NOTE — Progress Notes (Signed)
[facility] Gastroenterology Progress Note  [name] 76 y.o. [birth year]  CC: Gallstone pancreatitis   Subjective: Patient states she is doing well today.  Denies nausea/vomiting.  Continued abdominal pain.  ROS : Review of Systems  Constitutional:  Negative for chills, fever and weight loss.  Gastrointestinal:  Positive for abdominal pain. Negative for blood in stool, constipation, diarrhea, heartburn, melena, nausea and vomiting.      Objective: Vital signs in last 24 hours: Vitals:   02/07/22 0422 02/07/22 1015  BP: 139/62 (!) 163/65  Pulse: 80 84  Resp: 18 (!) 22  Temp: 97.6 F (36.4 C) 98.1 F (36.7 C)  SpO2: 98% 99%    Physical Exam:  General:  Alert, cooperative, no distress, appears stated age  Head:  Normocephalic, without obvious abnormality, atraumatic  Eyes:  Anicteric sclera, EOM's intact  Lungs:   Clear to auscultation bilaterally, respirations unlabored  Heart:  Regular rate and rhythm, S1, S2 normal  Abdomen:   Soft, generalized abdominal tenderness, bowel sounds active all four quadrants,  no masses,     Lab Results: Recent Labs    02/05/22 1004 02/06/22 0639 02/07/22 0523  NA 137 142 141  K 3.4* 4.0 3.9  CL 103 112* 112*  CO2 19* 20* 19*  GLUCOSE 152* 112* 97  BUN 29* 18 16  CREATININE 1.14* 0.94 0.79  CALCIUM 9.0 8.6* 8.6*  MG 1.8  --   --   PHOS 4.1  --   --    Recent Labs    02/06/22 0639 02/07/22 0523  AST 29 31  ALT 28 25  ALKPHOS 84 78  BILITOT 0.6 0.7  PROT 7.4 7.1  ALBUMIN 3.8 3.5   Recent Labs    02/06/22 0639 02/07/22 0523  WBC 15.3* 13.6*  HGB 11.0* 10.2*  HCT 33.8* 31.7*  MCV 86.4 88.3  PLT 358 347   No results for input(s): "LABPROT", "INR" in the last 72 hours.    Assessment Gallstone pancreatitis -MRCP today was negative for CBD stone Hypertension Hyperlipidemia  Plan: CT scan showed possible CBD stone without ductal dilation.  MRCP today was negative for CBD stone.  Recommend following up  with general surgery for consideration of cholecystectomy with IOC.  No indication for ERCP at this time LFTs continue to remain unremarkable.  Continue to monitor LFTs Continue supportive care and pain control [facility] will follow  Garnette Scheuermann PA-C 02/07/2022, 11:54 AM  Contact #  [phone]

## 2022-02-08 ENCOUNTER — Encounter (HOSPITAL_COMMUNITY)
Admission: EM | Disposition: A | Discharge status: Home / Self Care | Payer: Self-pay | Source: Home / Self Care | Attending: Internal Medicine

## 2022-02-08 ENCOUNTER — Inpatient Hospital Stay (HOSPITAL_COMMUNITY): Payer: Medicare Other | Admitting: Anesthesiology

## 2022-02-08 ENCOUNTER — Other Ambulatory Visit: Payer: Self-pay

## 2022-02-08 ENCOUNTER — Inpatient Hospital Stay (HOSPITAL_COMMUNITY): Payer: Medicare Other

## 2022-02-08 ENCOUNTER — Encounter (HOSPITAL_COMMUNITY): Payer: Self-pay

## 2022-02-08 DIAGNOSIS — K851 Biliary acute pancreatitis without necrosis or infection: Secondary | ICD-10-CM

## 2022-02-08 HISTORY — PX: CHOLECYSTECTOMY: SHX55

## 2022-02-08 LAB — COMPREHENSIVE METABOLIC PANEL
ALT: 23 U/L (ref 0–44)
AST: 33 U/L (ref 15–41)
Albumin: 3.4 g/dL — ABNORMAL LOW (ref 3.5–5.0)
Alkaline Phosphatase: 68 U/L (ref 38–126)
Anion gap: 9 (ref 5–15)
BUN: 24 mg/dL — ABNORMAL HIGH (ref 8–23)
CO2: 20 mmol/L — ABNORMAL LOW (ref 22–32)
Calcium: 8.7 mg/dL — ABNORMAL LOW (ref 8.9–10.3)
Chloride: 113 mmol/L — ABNORMAL HIGH (ref 98–111)
Creatinine, Ser: 1.02 mg/dL — ABNORMAL HIGH (ref 0.44–1.00)
GFR, Estimated: 57 mL/min — ABNORMAL LOW (ref >60)
Glucose, Bld: 88 mg/dL (ref 70–99)
Potassium: 3.6 mmol/L (ref 3.5–5.1)
Sodium: 142 mmol/L (ref 135–145)
Total Bilirubin: 0.9 mg/dL (ref 0.3–1.2)
Total Protein: 6.5 g/dL (ref 6.5–8.1)

## 2022-02-08 LAB — CBC
HCT: 32.1 % — ABNORMAL LOW (ref 36.0–46.0)
Hemoglobin: 10.3 g/dL — ABNORMAL LOW (ref 12.0–15.0)
MCH: 27.9 pg (ref 26.0–34.0)
MCHC: 32.1 g/dL (ref 30.0–36.0)
MCV: 87 fL (ref 80.0–100.0)
Platelets: 353 10*3/uL (ref 150–400)
RBC: 3.69 MIL/uL — ABNORMAL LOW (ref 3.87–5.11)
RDW: 15.3 % (ref 11.5–15.5)
WBC: 11.9 10*3/uL — ABNORMAL HIGH (ref 4.0–10.5)
nRBC: 0 % (ref 0.0–0.2)

## 2022-02-08 LAB — LIPASE, BLOOD: Lipase: 70 U/L — ABNORMAL HIGH (ref 11–51)

## 2022-02-08 SURGERY — LAPAROSCOPIC CHOLECYSTECTOMY
Anesthesia: General

## 2022-02-08 MED ORDER — FENTANYL CITRATE PF 50 MCG/ML IJ SOSY: 25.0000 ug | PREFILLED_SYRINGE | INTRAMUSCULAR | Status: DC | PRN

## 2022-02-08 MED ORDER — 0.9 % SODIUM CHLORIDE (POUR BTL) OPTIME
TOPICAL | Status: DC | PRN
  Administered 2022-02-08: 1000 mL

## 2022-02-08 MED ORDER — OXYCODONE HCL 5 MG PO TABS: 2.5000 mg | ORAL_TABLET | ORAL | Status: DC | PRN

## 2022-02-08 MED ORDER — ROCURONIUM BROMIDE 10 MG/ML (PF) SYRINGE
PREFILLED_SYRINGE | INTRAVENOUS | Status: DC | PRN
  Administered 2022-02-08: 70 mg via INTRAVENOUS

## 2022-02-08 MED ORDER — SUGAMMADEX SODIUM 200 MG/2ML IV SOLN
INTRAVENOUS | Status: DC | PRN
  Administered 2022-02-08: 200 mg via INTRAVENOUS

## 2022-02-08 MED ORDER — IOHEXOL 300 MG/ML  SOLN
INTRAMUSCULAR | Status: DC | PRN
  Administered 2022-02-08: 3 mL

## 2022-02-08 MED ORDER — PHENYLEPHRINE 80 MCG/ML (10ML) SYRINGE FOR IV PUSH (FOR BLOOD PRESSURE SUPPORT)
PREFILLED_SYRINGE | INTRAVENOUS | Status: DC | PRN
  Administered 2022-02-08 (×2): 80 ug via INTRAVENOUS

## 2022-02-08 MED ORDER — LIDOCAINE 2% (20 MG/ML) 5 ML SYRINGE
INTRAMUSCULAR | Status: DC | PRN
  Administered 2022-02-08: 60 mg via INTRAVENOUS

## 2022-02-08 MED ORDER — ROCURONIUM BROMIDE 10 MG/ML (PF) SYRINGE
PREFILLED_SYRINGE | INTRAVENOUS | Status: AC
  Filled 2022-02-08: qty 10

## 2022-02-08 MED ORDER — ENOXAPARIN SODIUM 40 MG/0.4ML IJ SOSY
40.0000 mg | PREFILLED_SYRINGE | INTRAMUSCULAR | Status: DC
  Administered 2022-02-09: 40 mg via SUBCUTANEOUS
  Filled 2022-02-08: qty 0.4

## 2022-02-08 MED ORDER — PHENYLEPHRINE 80 MCG/ML (10ML) SYRINGE FOR IV PUSH (FOR BLOOD PRESSURE SUPPORT)
PREFILLED_SYRINGE | INTRAVENOUS | Status: AC
  Filled 2022-02-08: qty 10

## 2022-02-08 MED ORDER — ACETAMINOPHEN 500 MG PO TABS
1000.0000 mg | ORAL_TABLET | Freq: Once | ORAL | Status: AC
  Administered 2022-02-08: 1000 mg via ORAL
  Filled 2022-02-08: qty 2

## 2022-02-08 MED ORDER — PROPOFOL 10 MG/ML IV BOLUS
INTRAVENOUS | Status: DC | PRN
  Administered 2022-02-08: 120 mg via INTRAVENOUS

## 2022-02-08 MED ORDER — LACTATED RINGERS IR SOLN
Status: DC | PRN
  Administered 2022-02-08: 2000 mL

## 2022-02-08 MED ORDER — DIPHENHYDRAMINE HCL 50 MG/ML IJ SOLN
INTRAMUSCULAR | Status: DC | PRN
  Administered 2022-02-08: 12.5 mg via INTRAVENOUS

## 2022-02-08 MED ORDER — AMISULPRIDE (ANTIEMETIC) 5 MG/2ML IV SOLN: 10.0000 mg | Freq: Once | INTRAVENOUS | Status: DC | PRN

## 2022-02-08 MED ORDER — PROMETHAZINE HCL 25 MG/ML IJ SOLN: 6.2500 mg | INTRAMUSCULAR | Status: DC | PRN

## 2022-02-08 MED ORDER — FENTANYL CITRATE (PF) 250 MCG/5ML IJ SOLN
INTRAMUSCULAR | Status: DC | PRN
  Administered 2022-02-08 (×5): 50 ug via INTRAVENOUS

## 2022-02-08 MED ORDER — INDOCYANINE GREEN 25 MG IV SOLR: 2.5000 mg | Freq: Once | INTRAVENOUS | Status: DC

## 2022-02-08 MED ORDER — BUPIVACAINE-EPINEPHRINE 0.5% -1:200000 IJ SOLN
INTRAMUSCULAR | Status: DC | PRN
  Administered 2022-02-08: 26 mL

## 2022-02-08 MED ORDER — ONDANSETRON HCL 4 MG/2ML IJ SOLN
INTRAMUSCULAR | Status: DC | PRN
  Administered 2022-02-08: 4 mg via INTRAVENOUS

## 2022-02-08 MED ORDER — DEXAMETHASONE SODIUM PHOSPHATE 10 MG/ML IJ SOLN
INTRAMUSCULAR | Status: DC | PRN
  Administered 2022-02-08: 8 mg via INTRAVENOUS

## 2022-02-08 MED ORDER — LACTATED RINGERS IV SOLN: INTRAVENOUS | Status: DC | PRN

## 2022-02-08 MED ORDER — FENTANYL CITRATE (PF) 250 MCG/5ML IJ SOLN
INTRAMUSCULAR | Status: AC
  Filled 2022-02-08: qty 5

## 2022-02-08 MED ORDER — BUPIVACAINE-EPINEPHRINE (PF) 0.5% -1:200000 IJ SOLN
INTRAMUSCULAR | Status: AC
  Filled 2022-02-08: qty 30

## 2022-02-08 MED ORDER — PROPOFOL 10 MG/ML IV BOLUS
INTRAVENOUS | Status: AC
  Filled 2022-02-08: qty 20

## 2022-02-08 SURGICAL SUPPLY — 37 items
APPLIER CLIP 5 13 M/L LIGAMAX5 (MISCELLANEOUS) ×1
BAG COUNTER SPONGE SURGICOUNT (BAG) IMPLANT
CABLE HIGH FREQUENCY MONO STRZ (ELECTRODE) ×1 IMPLANT
CHLORAPREP W/TINT 26 (MISCELLANEOUS) ×1 IMPLANT
CLIP APPLIE 5 13 M/L LIGAMAX5 (MISCELLANEOUS) ×1 IMPLANT
COVER MAYO STAND STRL (DRAPES) IMPLANT
COVER MAYO STAND XLG (MISCELLANEOUS) IMPLANT
DERMABOND ADVANCED .7 DNX12 (GAUZE/BANDAGES/DRESSINGS) ×1 IMPLANT
DRAPE C-ARM 42X120 X-RAY (DRAPES) IMPLANT
ELECT REM PT RETURN 15FT ADLT (MISCELLANEOUS) ×1 IMPLANT
GLOVE BIO SURGEON STRL SZ 6.5 (GLOVE) ×1 IMPLANT
GLOVE BIOGEL PI IND STRL 7.0 (GLOVE) ×1 IMPLANT
GLOVE INDICATOR 6.5 STRL GRN (GLOVE) ×1 IMPLANT
GOWN STRL REUS W/ TWL XL LVL3 (GOWN DISPOSABLE) ×3 IMPLANT
GOWN STRL REUS W/TWL XL LVL3 (GOWN DISPOSABLE) ×3
IRRIG SUCT STRYKERFLOW 2 WTIP (MISCELLANEOUS) ×1
IRRIGATION SUCT STRKRFLW 2 WTP (MISCELLANEOUS) ×1 IMPLANT
IV CATH 14GX2 1/4 (CATHETERS) IMPLANT
KIT BASIN OR (CUSTOM PROCEDURE TRAY) ×1 IMPLANT
KIT TURNOVER KIT A (KITS) IMPLANT
PENCIL SMOKE EVACUATOR (MISCELLANEOUS) IMPLANT
SCISSORS LAP 5X35 DISP (ENDOMECHANICALS) ×1 IMPLANT
SET CHOLANGIOGRAPH MIX (MISCELLANEOUS) IMPLANT
SET TUBE SMOKE EVAC HIGH FLOW (TUBING) ×1 IMPLANT
SLEEVE ADV FIXATION 5X100MM (TROCAR) IMPLANT
SLEEVE Z-THREAD 5X100MM (TROCAR) ×2 IMPLANT
SUT VIC AB 2-0 SH 27 (SUTURE) ×1
SUT VIC AB 2-0 SH 27X BRD (SUTURE) ×1 IMPLANT
SUT VIC AB 4-0 PS2 18 (SUTURE) ×1 IMPLANT
SYS BAG RETRIEVAL 10MM (BASKET) ×1
SYSTEM BAG RETRIEVAL 10MM (BASKET) ×1 IMPLANT
TOWEL OR 17X26 10 PK STRL BLUE (TOWEL DISPOSABLE) ×1 IMPLANT
TOWEL OR NON WOVEN STRL DISP B (DISPOSABLE) ×1 IMPLANT
TRAY LAPAROSCOPIC (CUSTOM PROCEDURE TRAY) ×1 IMPLANT
TROCAR ADV FIXATION 5X100MM (TROCAR) IMPLANT
TROCAR BALLN 12MMX100 BLUNT (TROCAR) ×1 IMPLANT
TROCAR Z-THREAD OPTICAL 5X100M (TROCAR) ×1 IMPLANT

## 2022-02-08 NOTE — Progress Notes (Signed)
Acute gallstone pancreatitis  Subjective: No acute issues overnight  Objective: Vital signs in last 24 hours: Temp:  [97.8 F (36.6 C)-98.9 F (37.2 C)] 97.8 F (36.6 C) (12/27 0529) Pulse Rate:  [77-90] 88 (12/27 0529) Resp:  [18-22] 18 (12/27 0529) BP: (136-163)/(61-66) 150/66 (12/27 0529) SpO2:  [99 %-100 %] 100 % (12/27 0529) Last BM Date : 02/07/22  Intake/Output from previous day: 12/26 0701 - 12/27 0700 In: 556 [P.O.:356; IV Piggyback:200] Out: -  Intake/Output this shift: No intake/output data recorded.  General appearance: alert and cooperative GI: normal findings: soft, non-tender  Lab Results:  Results for orders placed or performed during the hospital encounter of 02/05/22 (from the past 24 hour(s))  Glucose, capillary     Status: None   Collection Time: 02/07/22 10:14 AM  Result Value Ref Range   Glucose-Capillary 84 70 - 99 mg/dL     Studies/Results Radiology     MEDS, Scheduled  amLODipine  5 mg Oral Daily   And   benazepril  20 mg Oral Daily   atorvastatin  20 mg Oral Daily   indocyanine green  2.5 mg Intravenous Once   pantoprazole (PROTONIX) IV  40 mg Intravenous Q24H   venlafaxine XR  150 mg Oral Q breakfast     Assessment: Acute gallstone pancreatitis Possible non-obstruction CBD stone   Plan: OR today for lap chole and IOC.    The anatomy & physiology of hepatobiliary & pancreatic function was discussed.  The pathophysiology of gallbladder dysfunction was discussed.  Natural history risks without surgery was discussed.   I feel the risks of no intervention will lead to serious problems that outweigh the operative risks; therefore, I recommended cholecystectomy to remove the pathology.  I explained laparoscopic techniques with possible need for an open approach.  Probable cholangiogram to evaluate the bilary tract was explained as well.    Risks such as bleeding, infection, abscess, leak, injury to other organs, need for further  treatment, heart attack, death, and other risks were discussed.  I noted a good likelihood this will help address the problem.  Possibility that this will not correct all abdominal symptoms was explained.  Goals of post-operative recovery were discussed as well.  We will work to minimize complications.  An educational handout further explaining the pathology and treatment options was given as well.  Questions were answered.  The patient expresses understanding & wishes to proceed with surgery.   LOS: 3 days    Rosario Adie, MD [facility]  Patient's medical decision making was moderate (37 mins total met with patient care and documentation).   02/08/2022 8:22 AM

## 2022-02-08 NOTE — Anesthesia Postprocedure Evaluation (Signed)
Anesthesia Post Note  Patient: [name]  Procedure(s) Performed: LAPAROSCOPIC CHOLECYSTECTOMY WITH INTRAOPERATIVE CHOLANGIOGRAM     Patient location during evaluation: PACU Anesthesia Type: General Level of consciousness: awake and alert Pain management: pain level controlled Vital Signs Assessment: post-procedure vital signs reviewed and stable Respiratory status: spontaneous breathing, nonlabored ventilation, respiratory function stable and patient connected to nasal cannula oxygen Cardiovascular status: blood pressure returned to baseline and stable Postop Assessment: no apparent nausea or vomiting Anesthetic complications: no  No notable events documented.  Last Vitals:  Vitals:   02/08/22 1530 02/08/22 1544  BP: (!) 154/58 (!) 142/39  Pulse: 90 90  Resp: 20 18  Temp: (!) 36.4 C (!) 36.3 C  SpO2: 95% 97%    Last Pain:  Vitals:   02/08/22 1530  TempSrc:   PainSc: 0-No pain                 Eileen Croswell S

## 2022-02-08 NOTE — Progress Notes (Signed)
[name] 7:35 PM  Subjective: Patient in good spirits and looks well postop case discussed with her husband as well tolerating clear liquids l Objective: Vital signs stable afebrile no acute distress CBC okay liver test normal base decreased IntraOp cholangiogram reviewed agree no residual stones  Assessment: Gallstone pancreatitis currently improved status post lap chole  Plan: Happy to see back as needed please call me if I can be of any further assistance with this hospital stay  [facility]  office [phone] After 5PM or if no answer call [phone]

## 2022-02-08 NOTE — Op Note (Signed)
02/08/2022  2:50 PM  PATIENT:  [name]  76 y.o. female  Patient Care Team: Samuel Bouche, NP as PCP - General (Nurse Practitioner)  PRE-OPERATIVE DIAGNOSIS:  GALLSTONES PANCREATITIS  POST-OPERATIVE DIAGNOSIS:  GALLSTONES PANCREATITIS  PROCEDURE:  LAPAROSCOPIC CHOLECYSTECTOMY WITH INTRAOPERATIVE CHOLANGIOGRAM    Surgeon(s): Leighton Ruff, MD  ASSISTANT: none   ANESTHESIA:   local and general  EBL:  Total I/O In: 0  Out: 25 [Blood:25]  DRAINS: none   SPECIMEN:  Source of Specimen:  gallbladder  DISPOSITION OF SPECIMEN:  PATHOLOGY  COUNTS:  YES  PLAN OF CARE:  Patient already admitted  PATIENT DISPOSITION:  PACU - hemodynamically stable.  INDICATION: 76 y.o. F with gallstone pancreatitis and concern for choledocholithiasis on CT but negative MRCP.    The anatomy & physiology of hepatobiliary & pancreatic function was discussed.  The pathophysiology of gallbladder dysfunction was discussed.  Natural history risks without surgery was discussed.   I feel the risks of no intervention will lead to serious problems that outweigh the operative risks; therefore, I recommended cholecystectomy to remove the pathology.  I explained laparoscopic techniques with possible need for an open approach.  Probable cholangiogram to evaluate the bilary tract was explained as well.    Risks such as bleeding, infection, abscess, leak, injury to other organs, need for further treatment, heart attack, death, and other risks were discussed.  I noted a good likelihood this will help address the problem.  Possibility that this will not correct all abdominal symptoms was explained.  Goals of post-operative recovery were discussed as well.    OR FINDINGS: gallbladder with normal appears common bile duct on cholangiogram  DESCRIPTION:   The patient was identified & brought into the operating room. The patient was positioned supine with arms tucked. SCDs were active during the entire case.  The patient underwent general anesthesia without any difficulty.  The abdomen was prepped and draped in a sterile fashion. A Surgical Timeout was performed and confirmed our plan.  We positioned the patient in reverse [place] & right side up.  I placed a Hassan laparoscopic port through the umbilicus using open entry technique.  Entry was clean. There were no adhesions to the anterior abdominal wall supraumbilically.  We induced carbon dioxide insufflation. Camera inspection revealed no injury.    I proceeded to continue with laparoscopic technique. I placed a 5 mm port in mid subcostal region, another 83m port in the right flank near the anterior axillary line, and a 569mport in the left subxiphoid region obliquely within the falciform ligament.  I turned attention to the right upper quadrant.  The gallbladder fundus was elevated cephalad. I used cautery and blunt dissection to free the peritoneal coverings between the gallbladder and the liver on the posteriolateral and anteriomedial walls.   I used careful blunt and cautery dissection with a maryland dissector to help get a good critical view of the cystic artery and cystic duct. I did further dissection to free a few centimeters of the  gallbladder off the liver bed to get a good critical view of the infundibulum and cystic duct. I mobilized the cystic artery.  I skeletonized the cystic duct.  After getting a good 360 view, I decided to perform a cholangiogram.  I placed a clip on the infundibulum.   I did a partial cystic duct-otomy and ensured patency. I placed a 5 F cholangiocatheter through a puncture site at the right subcostal ridge of the abdominal wall and directed it  into the cystic duct.  This was secured with a clip. We ran a cholangiogram with dilute radio-opaque contrast and continuous fluoroscopy.  Contrast flowed from a side branch consistent with cystic duct cannulization. Contrast flowed up the common hepatic duct into the right and  left intrahepatic chains out to secondary radicals. Contrast flowed down the common bile duct easily across the normal ampulla into the duodenum.  This was consistent with a normal cholangiogram.  I removed the cholangiocatheter.  I placed clips on the cystic duct x3.  I completed cystic duct transection.   I placed clips on the cystic artery x3 with 2 proximally.  I ligated the cystic artery using scissors. I freed the gallbladder from its remaining attachments to the liver. I ensured hemostasis on the gallbladder fossa of the liver and elsewhere. I inspected the rest of the abdomen & detected no injury nor bleeding elsewhere.  I irrigated the RUQ with normal saline.  I removed the gallbladder through the umbilical port site.  I closed the umbilical fascia using 0 Vicryl stitches.   I closed the skin using 4-0 vicryl stitch.  Sterile dressings were applied. The patient was extubated & arrived in the PACU in stable condition.  I had discussed postoperative care with the patient in the holding area.   I will discuss operative findings and postoperative goals / instructions with the patient's family.  Instructions are written in the chart as well.   Rosario Adie, MD  [facility]

## 2022-02-08 NOTE — Anesthesia Procedure Notes (Signed)
Procedure Name: Intubation Date/Time: 02/08/2022 1:39 PM  Performed by: Sharlette Dense, CRNAPre-anesthesia Checklist: Patient identified, Emergency Drugs available, Suction available and Patient being monitored Patient Re-evaluated:Patient Re-evaluated prior to induction Oxygen Delivery Method: Circle system utilized Preoxygenation: Pre-oxygenation with 100% oxygen Induction Type: IV induction Ventilation: Mask ventilation without difficulty and Oral airway inserted - appropriate to patient size Laryngoscope Size: Sabra Heck and 2 Grade View: Grade I Tube type: Oral Tube size: 7.5 mm Number of attempts: 1 Airway Equipment and Method: Stylet and Oral airway Placement Confirmation: ETT inserted through vocal cords under direct vision, positive ETCO2 and breath sounds checked- equal and bilateral Secured at: 22 cm Tube secured with: Tape Dental Injury: Teeth and Oropharynx as per pre-operative assessment

## 2022-02-08 NOTE — Transfer of Care (Signed)
Immediate Anesthesia Transfer of Care Note  Patient: [name]  Procedure(s) Performed: LAPAROSCOPIC CHOLECYSTECTOMY WITH INTRAOPERATIVE CHOLANGIOGRAM  Patient Location: PACU  Anesthesia Type:General  Level of Consciousness: awake, alert , and oriented  Airway & Oxygen Therapy: Patient Spontanous Breathing and Patient connected to face mask oxygen  Post-op Assessment: Report given to RN and Post -op Vital signs reviewed and stable  Post vital signs: Reviewed and stable  Last Vitals:  Vitals Value Taken Time  BP 141/68 02/08/22 1459  Temp    Pulse 91 02/08/22 1500  Resp 19 02/08/22 1500  SpO2 100 % 02/08/22 1500  Vitals shown include unvalidated device data.  Last Pain:  Vitals:   02/08/22 1114  TempSrc: Oral  PainSc:       Patients Stated Pain Goal: 2 (49/20/10 0712)  Complications: No notable events documented.

## 2022-02-08 NOTE — Progress Notes (Signed)
  Progress Note   Patient: [name] WNU:[id number] DOB: April 19, 1945 DOA: 02/05/2022     3 DOS: the patient was seen and examined on 02/08/2022   Brief hospital course: 76 year old female with hypertension, osteoarthritis, anxiety and depression who presents to the hospital for abdominal pain, heartburn nausea diarrhea and fatigue. In the ED she was found to have a WBC count of 15.6, creatinine 1.14, lipase 8407 and potassium of 3.4. Treated with antiemetics IV fluids and IV antibiotics. Imaging revealed a common bile duct stone and inflammation of the pancreatic head and second and third portion of the duodenum. GI was consulted for gallstone pancreatitis.  Assessment and Plan: Principal Problem:   Acute gallstone pancreatitis -Lipase has improved to 83 - MRCP does not show CBD obstruction - General Surgery consulted, and pt now s/p lap chole 12/27   Active Problems: Elevated creatinine - Creatinine 1.14 when admitted - Has improved to normal    Hypokalemia - normalized   Metabolic acidosis - serum bicarb improved -Recheck bmet in AM     Essential hypertension - Continue amlodipine, benazepril and metoprolol as needed      Subjective: Without complaints this AM. Eager to have surgery  Physical Exam: Vitals:   02/08/22 1459 02/08/22 1500 02/08/22 1515 02/08/22 1530  BP: (!) 141/68 (!) 156/58 (!) 148/52 (!) 154/58  Pulse: 94 92 89 90  Resp: (!) 22 20 (!) 22 20  Temp:  (!) 97.5 F (36.4 C)  (!) 97.5 F (36.4 C)  TempSrc:      SpO2: 100% 100% 96% 95%  Weight:      Height:       General exam: Awake, laying in bed, in nad Respiratory system: Normal respiratory effort, no wheezing Cardiovascular system: regular rate, s1, s2 Gastrointestinal system: Soft, nondistended, positive BS Central nervous system: CN2-12 grossly intact, strength intact Extremities: Perfused, no clubbing Skin: Normal skin turgor, no notable skin lesions seen Psychiatry: Mood normal  // no visual hallucinations   Data Reviewed:  Labs reviewed: Na 142, K 3.6, Cr 1.02, WBC 11.9  Family Communication: Pt in room, family at bedside  Disposition: Status is: Inpatient Remains inpatient appropriate because: Severity of illness  Planned Discharge Destination: Home    Author: Marylu Lund, MD 02/08/2022 4:03 PM  For on call review [email or web address].

## 2022-02-08 NOTE — Progress Notes (Signed)
Mobility Specialist - Progress Note   02/08/22 0938  Mobility  Activity Ambulated with assistance in hallway  Level of Assistance Contact guard assist, steadying assist  Assistive Device None  Distance Ambulated (ft) 500 ft  Activity Response Tolerated well  Mobility Referral Yes  $Mobility charge 1 Mobility   Pt received in bed and agreed to mobility, had no c/o pain nor discomfort. Felt somewhat SOB nearing EOS, did not desat. Pt returned to bed with all needs met and family in room.   Roderick Pee Mobility Specialist

## 2022-02-08 NOTE — Hospital Course (Signed)
76 year old female with hypertension, osteoarthritis, anxiety and depression who presents to the hospital for abdominal pain, heartburn nausea diarrhea and fatigue. In the ED she was found to have a WBC count of 15.6, creatinine 1.14, lipase 8407 and potassium of 3.4. Treated with antiemetics IV fluids and IV antibiotics. Imaging revealed a common bile duct stone and inflammation of the pancreatic head and second and third portion of the duodenum. GI was consulted for gallstone pancreatitis.

## 2022-02-08 NOTE — Anesthesia Preprocedure Evaluation (Addendum)
Anesthesia Evaluation  Patient identified by MRN, date of birth, ID band Patient awake    Reviewed: Allergy & Precautions, NPO status , Patient's Chart, lab work & pertinent test results  History of Anesthesia Complications Negative for: history of anesthetic complications  Airway Mallampati: II  TM Distance: >3 FB     Dental no notable dental hx. (+) Dental Advisory Given   Pulmonary neg pulmonary ROS   Pulmonary exam normal        Cardiovascular hypertension, Pt. on medications Normal cardiovascular exam     Neuro/Psych  PSYCHIATRIC DISORDERS Anxiety Depression    negative neurological ROS     GI/Hepatic negative GI ROS, Neg liver ROS,,,  Endo/Other  negative endocrine ROS    Renal/GU negative Renal ROS     Musculoskeletal negative musculoskeletal ROS (+)    Abdominal   Peds  Hematology  (+) Blood dyscrasia, anemia   Anesthesia Other Findings   Reproductive/Obstetrics                             Anesthesia Physical Anesthesia Plan  ASA: 2  Anesthesia Plan: General   Post-op Pain Management: Tylenol PO (pre-op)*   Induction: Intravenous  PONV Risk Score and Plan: 4 or greater and Ondansetron, Dexamethasone, Diphenhydramine and Treatment may vary due to age or medical condition  Airway Management Planned: Oral ETT  Additional Equipment:   Intra-op Plan:   Post-operative Plan: Extubation in OR  Informed Consent: I have reviewed the patients History and Physical, chart, labs and discussed the procedure including the risks, benefits and alternatives for the proposed anesthesia with the patient or authorized representative who has indicated his/her understanding and acceptance.     Dental advisory given  Plan Discussed with: Anesthesiologist and CRNA  Anesthesia Plan Comments:        Anesthesia Quick Evaluation

## 2022-02-09 ENCOUNTER — Encounter (HOSPITAL_COMMUNITY): Payer: Self-pay | Admitting: General Surgery

## 2022-02-09 DIAGNOSIS — K851 Biliary acute pancreatitis without necrosis or infection: Secondary | ICD-10-CM | POA: Diagnosis not present

## 2022-02-09 LAB — CBC
HCT: 31 % — ABNORMAL LOW (ref 36.0–46.0)
Hemoglobin: 10.1 g/dL — ABNORMAL LOW (ref 12.0–15.0)
MCH: 28.2 pg (ref 26.0–34.0)
MCHC: 32.6 g/dL (ref 30.0–36.0)
MCV: 86.6 fL (ref 80.0–100.0)
Platelets: 361 10*3/uL (ref 150–400)
RBC: 3.58 MIL/uL — ABNORMAL LOW (ref 3.87–5.11)
RDW: 15 % (ref 11.5–15.5)
WBC: 12 10*3/uL — ABNORMAL HIGH (ref 4.0–10.5)
nRBC: 0 % (ref 0.0–0.2)

## 2022-02-09 LAB — COMPREHENSIVE METABOLIC PANEL
ALT: 51 U/L — ABNORMAL HIGH (ref 0–44)
AST: 72 U/L — ABNORMAL HIGH (ref 15–41)
Albumin: 3.3 g/dL — ABNORMAL LOW (ref 3.5–5.0)
Alkaline Phosphatase: 72 U/L (ref 38–126)
Anion gap: 12 (ref 5–15)
BUN: 28 mg/dL — ABNORMAL HIGH (ref 8–23)
CO2: 19 mmol/L — ABNORMAL LOW (ref 22–32)
Calcium: 8.6 mg/dL — ABNORMAL LOW (ref 8.9–10.3)
Chloride: 111 mmol/L (ref 98–111)
Creatinine, Ser: 1.12 mg/dL — ABNORMAL HIGH (ref 0.44–1.00)
GFR, Estimated: 51 mL/min — ABNORMAL LOW (ref >60)
Glucose, Bld: 127 mg/dL — ABNORMAL HIGH (ref 70–99)
Potassium: 3.5 mmol/L (ref 3.5–5.1)
Sodium: 142 mmol/L (ref 135–145)
Total Bilirubin: 0.6 mg/dL (ref 0.3–1.2)
Total Protein: 6.9 g/dL (ref 6.5–8.1)

## 2022-02-09 LAB — SURGICAL PATHOLOGY

## 2022-02-09 MED ORDER — OXYCODONE HCL 5 MG PO TABS: 2.5000 mg | ORAL_TABLET | ORAL | 0 refills | Status: DC | PRN

## 2022-02-09 NOTE — Discharge Summary (Addendum)
Physician Discharge Summary   Patient: [name] MRN: [id number] DOB: 1945/03/07  Admit date:     02/05/2022  Discharge date: 02/09/22  Discharge Physician: Marylu Lund   PCP: Samuel Bouche, NP   Recommendations at discharge:    Follow up with General Surgery as scheduled Follow up with PCP in 1-2 weeks  Discharge Diagnoses: Principal Problem:   Acute gallstone pancreatitis Active Problems:   Essential hypertension   Hyperlipidemia   Anxiety associated with depression   Prediabetes   Aortic atherosclerosis ([place])  Resolved Problems:   * No resolved hospital problems. *  Hospital Course: 76 year old female with hypertension, osteoarthritis, anxiety and depression who presents to the hospital for abdominal pain, heartburn nausea diarrhea and fatigue. In the ED she was found to have a WBC count of 15.6, creatinine 1.14, lipase 8407 and potassium of 3.4. Treated with antiemetics IV fluids and IV antibiotics. Imaging revealed a common bile duct stone and inflammation of the pancreatic head and second and third portion of the duodenum. GI was consulted for gallstone pancreatitis.  Assessment and Plan: Principal Problem:   Acute gallstone pancreatitis -Lipase has improved to 83 - MRCP does not show CBD obstruction - General Surgery consulted, and pt now s/p lap chole 12/27   Active Problems: Elevated creatinine - Creatinine 1.14 when admitted - Has improved to normal    Hypokalemia - normalized   Metabolic acidosis - serum bicarb improved     Essential hypertension - Continue amlodipine, benazepril -Resume home meds on d/c  Consultants: General Surgery, GI Procedures performed: Lap chole  Disposition: Home Diet recommendation:  Cardiac diet DISCHARGE MEDICATION: Allergies as of 02/09/2022       Reactions   Penicillins Hives   Tolerates Cephalosporins   Prednisone Other (See Comments)   Hyperactivity, sleep disturbance, confusion   Sulfa  Antibiotics Hives           Medication List     TAKE these medications    amLODipine-benazepril 5-20 MG capsule Commonly known as: LOTREL Take 1 capsule by mouth daily.   atorvastatin 20 MG tablet Commonly known as: LIPITOR TAKE ONE TABLET BY MOUTH ONE TIME A DAY What changed:  how much to take how to take this when to take this additional instructions   clonazePAM 0.5 MG tablet Commonly known as: KLONOPIN TAKE 1/2 TO 1 TABLET BY MOUTH TWO TIMES A DAY AS NEEDED What changed:  how much to take how to take this when to take this reasons to take this additional instructions   fluticasone 50 MCG/ACT nasal spray Commonly known as: FLONASE Place 1 spray into both nostrils daily as needed for allergies or rhinitis.   oxyCODONE 5 MG immediate release tablet Commonly known as: Oxy IR/ROXICODONE Take 0.5-1 tablets (2.5-5 mg total) by mouth every 4 (four) hours as needed for moderate pain or severe pain.   raloxifene 60 MG tablet Commonly known as: EVISTA TAKE 1 TABLET BY MOUTH DAILY   triamterene-hydrochlorothiazide 37.5-25 MG capsule Commonly known as: DYAZIDE TAKE ONE CAPSULE BY MOUTH ONE TIME A DAY What changed:  how much to take how to take this when to take this additional instructions   venlafaxine XR 150 MG 24 hr capsule Commonly known as: EFFEXOR-XR Take 1 capsule (150 mg total) by mouth daily with breakfast.   zolpidem 5 MG tablet Commonly known as: AMBIEN Take 1.5 tablets (7.5 mg total) by mouth at bedtime as needed for sleep.        Follow-up Information  [place], [place], PA-C Follow up on 03/09/2022.   Specialty: General Surgery Why: 03/09/22 at 8:45 am. Please arrive 30 minutes early to complete check in, and bring photo ID and insurance card. Contact information: [place] [address] [phone]         Samuel Bouche, NP Follow up in 2 week(s).   Specialty: Nurse  Practitioner Why: Hospital follow up Contact information: [address] [place] [address] [phone]                Discharge Exam: Danley Danker Weights   02/05/22 1754 02/08/22 1112  Weight: 77.1 kg 77.1 kg   General exam: Awake, laying in bed, in nad Respiratory system: Normal respiratory effort, no wheezing Cardiovascular system: regular rate, s1, s2 Gastrointestinal system: Soft, nondistended, positive BS Central nervous system: CN2-12 grossly intact, strength intact Extremities: Perfused, no clubbing Skin: Normal skin turgor, no notable skin lesions seen Psychiatry: Mood normal // no visual hallucinations   Condition at discharge: fair  The results of significant diagnostics from this hospitalization (including imaging, microbiology, ancillary and laboratory) are listed below for reference.   Imaging Studies: DG Cholangiogram Operative  Result Date: 02/08/2022 CLINICAL DATA:  Cholecystectomy EXAM: INTRAOPERATIVE CHOLANGIOGRAM TECHNIQUE: Cholangiographic images from the C-arm fluoroscopic device were submitted for interpretation post-operatively. Please see the procedural report for the amount of contrast and the fluoroscopy time utilized. FLUOROSCOPY: Radiation Exposure Index (as provided by the fluoroscopic device): 2.32 mGy Kerma COMPARISON:  02/05/2022 FINDINGS: Intraoperative cholangiogram performed during laparoscopic procedure. The residual cystic duct, common hepatic duct, and common bile duct are all patent. Contrast easily drains into the duodenum. No significant dilatation, obstruction, stricture, or large filling defect. IMPRESSION: Patent biliary system. Electronically Signed   By: Jerilynn Mages.  Shick M.D.   On: 02/08/2022 16:00   MR ABDOMEN MRCP W WO CONTAST  Result Date: 02/07/2022 CLINICAL DATA:  Pancreatitis. Possible choledocholithiasis on recent CT. EXAM: MRI ABDOMEN WITHOUT AND WITH CONTRAST (INCLUDING MRCP) TECHNIQUE: Multiplanar  multisequence MR imaging of the abdomen was performed both before and after the administration of intravenous contrast. Heavily T2-weighted images of the biliary and pancreatic ducts were obtained, and three-dimensional MRCP images were rendered by post processing. CONTRAST:  7.4m GADAVIST GADOBUTROL 1 MMOL/ML IV SOLN COMPARISON:  CT on 02/05/2022 FINDINGS: Lower chest: No acute findings. Hepatobiliary: No hepatic masses identified. Gallbladder is unremarkable. No evidence of biliary ductal dilatation, with common bile duct measuring 4 mm in diameter. No evidence of choledocholithiasis. Pancreas: Mild peripancreatic edema is seen, consistent with acute pancreatitis. No evidence of pancreatic necrosis, pseudocyst, or mass. No evidence of pancreatic ductal dilatation or pancreas divisum. Spleen:  Within normal limits in size and appearance. Adrenals/Urinary Tract: No suspicious masses identified. No evidence of hydronephrosis. Stomach/Bowel: Unremarkable. Vascular/Lymphatic: No pathologically enlarged lymph nodes identified. No acute vascular findings. Other:  None. Musculoskeletal:  No suspicious bone lesions identified. IMPRESSION: Mild acute pancreatitis. No evidence of pancreatic necrosis, pseudocyst, or other complication. No evidence of biliary ductal dilatation or choledocholithiasis. Electronically Signed   By: JMarlaine HindM.D.   On: 02/07/2022 10:30   MR 3D Recon At Scanner  Result Date: 02/07/2022 CLINICAL DATA:  Pancreatitis. Possible choledocholithiasis on recent CT. EXAM: MRI ABDOMEN WITHOUT AND WITH CONTRAST (INCLUDING MRCP) TECHNIQUE: Multiplanar multisequence MR imaging of the abdomen was performed both before and after the administration of intravenous contrast. Heavily T2-weighted images of the biliary and pancreatic ducts were obtained, and three-dimensional  MRCP images were rendered by post processing. CONTRAST:  7.28m GADAVIST GADOBUTROL 1 MMOL/ML IV SOLN COMPARISON:  CT on 02/05/2022  FINDINGS: Lower chest: No acute findings. Hepatobiliary: No hepatic masses identified. Gallbladder is unremarkable. No evidence of biliary ductal dilatation, with common bile duct measuring 4 mm in diameter. No evidence of choledocholithiasis. Pancreas: Mild peripancreatic edema is seen, consistent with acute pancreatitis. No evidence of pancreatic necrosis, pseudocyst, or mass. No evidence of pancreatic ductal dilatation or pancreas divisum. Spleen:  Within normal limits in size and appearance. Adrenals/Urinary Tract: No suspicious masses identified. No evidence of hydronephrosis. Stomach/Bowel: Unremarkable. Vascular/Lymphatic: No pathologically enlarged lymph nodes identified. No acute vascular findings. Other:  None. Musculoskeletal:  No suspicious bone lesions identified. IMPRESSION: Mild acute pancreatitis. No evidence of pancreatic necrosis, pseudocyst, or other complication. No evidence of biliary ductal dilatation or choledocholithiasis. Electronically Signed   By: JMarlaine HindM.D.   On: 02/07/2022 10:30   CT Abdomen Pelvis W Contrast  Result Date: 02/05/2022 CLINICAL DATA:  Abdominal pain.  Elevated lipase. EXAM: CT ABDOMEN AND PELVIS WITH CONTRAST TECHNIQUE: Multidetector CT imaging of the abdomen and pelvis was performed using the standard protocol following bolus administration of intravenous contrast. RADIATION DOSE REDUCTION: This exam was performed according to the departmental dose-optimization program which includes automated exposure control, adjustment of the mA and/or kV according to patient size and/or use of iterative reconstruction technique. CONTRAST:  825mOMNIPAQUE IOHEXOL 300 MG/ML  SOLN COMPARISON:  None Available. FINDINGS: Lower chest: No acute findings. Hepatobiliary: Normal liver. Gallbladder is distended, without evidence of stones or wall thickening. Common bile duct is normal in caliber. There is a small stone, 2-3 mm, that projects at the ampullary of Vater. Pancreas: Mild  dilation of the pancreatic duct, maximum 4 mm. There is subtle inflammatory changes adjacent to the pancreatic head and second and third portions of the duodenum, along the adjacent anterior right pararenal fascia. Remainder of the pancreas is unremarkable. No pancreatic mass. No peripancreatic fluid collection. Spleen: Normal in size without focal abnormality. Adrenals/Urinary Tract: Normal adrenal glands. Kidneys normal in size, orientation and position with symmetric enhancement and excretion. Small low-attenuation lesions posterior upper pole the right kidney, anterior lower pole of the left kidney, too small to fully characterize, but consistent with cysts. No follow-up recommended. No renal stones. No hydronephrosis. Normal ureters. Normal bladder. Stomach/Bowel: Stomach decompressed, otherwise unremarkable. Small bowel and colon are normal in caliber. No wall thickening. No inflammation. Normal appendix visualized. Vascular/Lymphatic: Aortic atherosclerosis. No aneurysm. No enlarged lymph nodes. Reproductive: Uterus normal in size. Lower attenuation endometrium is prominent measuring 9 mm in thickness. No adnexal masses. Other: No abdominal wall hernia or abnormality. No abdominopelvic ascites. Musculoskeletal: No fracture or acute finding.  No bone lesion. IMPRESSION: 1. 2-3 mm stone projects in the distal common bile duct at the level of the ampulla of Vater. No common bile duct dilation or intrahepatic bile duct dilation. Mild dilation of the pancreatic duct. There are subtle inflammatory changes adjacent to the pancreatic head and second and third portion of the duodenum. Suspect minimal uncomplicated pancreatitis. 2. No other evidence of an acute abnormality within the abdomen or pelvis. 3. Low-attenuation endometrium appears mildly thickened for this patient's age. Consider nonemergent follow-up pelvic ultrasound. 4. Aortic atherosclerosis. Electronically Signed   By: DaLajean Manes.D.   On: 02/05/2022  12:37    Microbiology: Results for orders placed or performed during the hospital encounter of 09/10/15  Urine culture     Status: Abnormal  Collection Time: 09/10/15 12:39 PM   Specimen: Urine, Clean Catch  Result Value Ref Range Status   Specimen Description URINE, CLEAN CATCH  Final   Special Requests NONE  Final   Culture MULTIPLE SPECIES PRESENT, SUGGEST RECOLLECTION (A)  Final   Report Status 09/11/2015 FINAL  Final  MRSA PCR Screening     Status: None   Collection Time: 09/10/15 12:41 PM   Specimen: Nasopharyngeal  Result Value Ref Range Status   MRSA by PCR NEGATIVE NEGATIVE Final    Comment:        The GeneXpert MRSA Assay (FDA approved for NASAL specimens only), is one component of a comprehensive MRSA colonization surveillance program. It is not intended to diagnose MRSA infection nor to guide or monitor treatment for MRSA infections.   Surgical PCR screen     Status: Abnormal   Collection Time: 09/10/15 12:41 PM   Specimen: Nasal Mucosa; Nasal Swab  Result Value Ref Range Status   MRSA, PCR NEGATIVE NEGATIVE Final   Staphylococcus aureus POSITIVE (A) NEGATIVE Final    Comment:        The Xpert SA Assay (FDA approved for NASAL specimens in patients over 47 years of age), is one component of a comprehensive surveillance program.  Test performance has been validated by [facility] for patients greater than or equal to 69 year old. It is not intended to diagnose infection nor to guide or monitor treatment.     Labs: CBC: Recent Labs  Lab 02/05/22 1004 02/06/22 0639 02/07/22 0523 02/08/22 0813 02/09/22 0636  WBC 15.6* 15.3* 13.6* 11.9* 12.0*  HGB 12.1 11.0* 10.2* 10.3* 10.1*  HCT 35.5* 33.8* 31.7* 32.1* 31.0*  MCV 83.7 86.4 88.3 87.0 86.6  PLT 405* 358 347 353 637   Basic Metabolic Panel: Recent Labs  Lab 02/05/22 1004 02/06/22 0639 02/07/22 0523 02/08/22 0813 02/09/22 0636  NA 137 142 141 142 142  K 3.4* 4.0 3.9 3.6 3.5  CL 103 112*  112* 113* 111  CO2 19* 20* 19* 20* 19*  GLUCOSE 152* 112* 97 88 127*  BUN 29* 18 16 24* 28*  CREATININE 1.14* 0.94 0.79 1.02* 1.12*  CALCIUM 9.0 8.6* 8.6* 8.7* 8.6*  MG 1.8  --   --   --   --   PHOS 4.1  --   --   --   --    Liver Function Tests: Recent Labs  Lab 02/05/22 1004 02/06/22 0639 02/07/22 0523 02/08/22 0813 02/09/22 0636  AST '29 29 31 '$ 33 72*  ALT '30 28 25 23 '$ 51*  ALKPHOS 89 84 78 68 72  BILITOT 0.4 0.6 0.7 0.9 0.6  PROT 7.6 7.4 7.1 6.5 6.9  ALBUMIN 4.4 3.8 3.5 3.4* 3.3*   CBG: Recent Labs  Lab 02/07/22 1014  GLUCAP 84    Discharge time spent: less than 30 minutes.  Signed: Marylu Lund, MD [facility] 02/09/2022

## 2022-02-09 NOTE — Discharge Instructions (Signed)
[facility]  Please arrive at least 30 min before your appointment to complete your check in paperwork.  If you are unable to arrive 30 min prior to your appointment time we may have to cancel or reschedule you. LAPAROSCOPIC SURGERY: POST OP INSTRUCTIONS Always review your discharge instruction sheet given to you by the facility where your surgery was performed. IF YOU HAVE DISABILITY OR FAMILY LEAVE FORMS, YOU MUST BRING THEM TO THE OFFICE FOR PROCESSING.   DO NOT GIVE THEM TO YOUR DOCTOR.  PAIN CONTROL  First take acetaminophen (Tylenol) AND/or ibuprofen (Advil) to control your pain after surgery.  Follow directions on package.  Taking acetaminophen (Tylenol) and/or ibuprofen (Advil) regularly after surgery will help to control your pain and lower the amount of prescription pain medication you may need.  You should not take more than 4,000 mg (4 grams) of acetaminophen (Tylenol) in 24 hours.  You should not take ibuprofen (Advil), aleve, motrin, naprosyn or other NSAIDS if you have a history of stomach ulcers or chronic kidney disease.  A prescription for pain medication may be given to you upon discharge.  Take your pain medication as prescribed, if you still have uncontrolled pain after taking acetaminophen (Tylenol) or ibuprofen (Advil). Use ice packs to help control pain. If you need a refill on your pain medication, please contact your pharmacy.  They will contact our office to request authorization. Prescriptions will not be filled after 5pm or on week-ends.  HOME MEDICATIONS Take your usually prescribed medications unless otherwise directed.  DIET You should follow a light diet the first few days after arrival home.  Be sure to include lots of fluids daily. Avoid fatty, fried foods.   CONSTIPATION It is common to experience some constipation after surgery and if you are taking pain medication.  Increasing fluid intake and taking a stool softener (such as Colace)  will usually help or prevent this problem from occurring.  A mild laxative (Milk of Magnesia or Miralax) should be taken according to package instructions if there are no bowel movements after 48 hours.  WOUND/INCISION CARE Most patients will experience some swelling and bruising in the area of the incisions.  Ice packs will help.  Swelling and bruising can take several days to resolve.  Unless discharge instructions indicate otherwise, follow guidelines below  STERI-STRIPS - you may remove your outer bandages 48 hours after surgery, and you may shower at that time.  You have steri-strips (small skin tapes) in place directly over the incision.  These strips should be left on the skin for 7-10 days.   DERMABOND/SKIN GLUE - you may shower in 24 hours.  The glue will flake off over the next 2-3 weeks. Any sutures or staples will be removed at the office during your follow-up visit.  ACTIVITIES You may resume regular (light) daily activities beginning the next day--such as daily self-care, walking, climbing stairs--gradually increasing activities as tolerated.  You may have sexual intercourse when it is comfortable.  Refrain from any heavy lifting or straining until approved by your doctor. You may drive when you are no longer taking prescription pain medication, you can comfortably wear a seatbelt, and you can safely maneuver your car and apply brakes.  FOLLOW-UP You should see your doctor in the office for a follow-up appointment approximately 2-3 weeks after your surgery.  You should have been given your post-op/follow-up appointment when your surgery was scheduled.  If you did not receive a post-op/follow-up appointment, make sure   that you call for this appointment within a day or two after you arrive home to insure a convenient appointment time.   WHEN TO CALL YOUR DOCTOR: Fever over 101.0 Inability to urinate Continued bleeding from incision. Increased pain, redness, or drainage from the  incision. Increasing abdominal pain  The clinic staff is available to answer your questions during regular business hours.  Please don't hesitate to call and ask to speak to one of the nurses for clinical concerns.  If you have a medical emergency, go to the nearest emergency room or call 911.  A surgeon from [facility] is always on call at the hospital. [address], [address], [place], [place]  [address] ? [address] [address], [place], [place]   [address] [phone] ? [phone] ? [phone] [phone]  

## 2022-02-09 NOTE — Plan of Care (Signed)

## 2022-02-09 NOTE — Progress Notes (Signed)
1 Day Post-Op  Subjective: No acute issues overnight, tolerating liquids.  Pain controlled.  Ambulating   Objective: Vital signs in last 24 hours: Temp:  [97.3 F (36.3 C)-98.3 F (36.8 C)] 97.9 F (36.6 C) (12/28 0528) Pulse Rate:  [72-94] 72 (12/28 0528) Resp:  [18-22] 18 (12/28 0528) BP: (113-159)/(39-68) 113/52 (12/28 0528) SpO2:  [95 %-100 %] 99 % (12/28 0528) Weight:  [77.1 kg] 77.1 kg (12/27 1112)   Intake/Output from previous day: 12/27 0701 - 12/28 0700 In: 600 [I.V.:600] Out: 25 [Blood:25] Intake/Output this shift: No intake/output data recorded.   General appearance: alert and cooperative GI: soft, appropriately tender  Incision: no significant drainage  Lab Results:  Recent Labs    02/08/22 0813 02/09/22 0636  WBC 11.9* 12.0*  HGB 10.3* 10.1*  HCT 32.1* 31.0*  PLT 353 361   BMET Recent Labs    02/07/22 0523 02/08/22 0813  NA 141 142  K 3.9 3.6  CL 112* 113*  CO2 19* 20*  GLUCOSE 97 88  BUN 16 24*  CREATININE 0.79 1.02*  CALCIUM 8.6* 8.7*   PT/INR No results for input(s): "LABPROT", "INR" in the last 72 hours. ABG No results for input(s): "PHART", "HCO3" in the last 72 hours.  Invalid input(s): "PCO2", "PO2"  MEDS, Scheduled  amLODipine  5 mg Oral Daily   And   benazepril  20 mg Oral Daily   atorvastatin  20 mg Oral Daily   enoxaparin (LOVENOX) injection  40 mg Subcutaneous Q24H   pantoprazole (PROTONIX) IV  40 mg Intravenous Q24H   venlafaxine XR  150 mg Oral Q breakfast    Studies/Results: DG Cholangiogram Operative  Result Date: 02/08/2022 CLINICAL DATA:  Cholecystectomy EXAM: INTRAOPERATIVE CHOLANGIOGRAM TECHNIQUE: Cholangiographic images from the C-arm fluoroscopic device were submitted for interpretation post-operatively. Please see the procedural report for the amount of contrast and the fluoroscopy time utilized. FLUOROSCOPY: Radiation Exposure Index (as provided by the fluoroscopic device): 2.32 mGy Kerma COMPARISON:   02/05/2022 FINDINGS: Intraoperative cholangiogram performed during laparoscopic procedure. The residual cystic duct, common hepatic duct, and common bile duct are all patent. Contrast easily drains into the duodenum. No significant dilatation, obstruction, stricture, or large filling defect. IMPRESSION: Patent biliary system. Electronically Signed   By: Jerilynn Mages.  Shick M.D.   On: 02/08/2022 16:00   MR ABDOMEN MRCP W WO CONTAST  Result Date: 02/07/2022 CLINICAL DATA:  Pancreatitis. Possible choledocholithiasis on recent CT. EXAM: MRI ABDOMEN WITHOUT AND WITH CONTRAST (INCLUDING MRCP) TECHNIQUE: Multiplanar multisequence MR imaging of the abdomen was performed both before and after the administration of intravenous contrast. Heavily T2-weighted images of the biliary and pancreatic ducts were obtained, and three-dimensional MRCP images were rendered by post processing. CONTRAST:  7.43m GADAVIST GADOBUTROL 1 MMOL/ML IV SOLN COMPARISON:  CT on 02/05/2022 FINDINGS: Lower chest: No acute findings. Hepatobiliary: No hepatic masses identified. Gallbladder is unremarkable. No evidence of biliary ductal dilatation, with common bile duct measuring 4 mm in diameter. No evidence of choledocholithiasis. Pancreas: Mild peripancreatic edema is seen, consistent with acute pancreatitis. No evidence of pancreatic necrosis, pseudocyst, or mass. No evidence of pancreatic ductal dilatation or pancreas divisum. Spleen:  Within normal limits in size and appearance. Adrenals/Urinary Tract: No suspicious masses identified. No evidence of hydronephrosis. Stomach/Bowel: Unremarkable. Vascular/Lymphatic: No pathologically enlarged lymph nodes identified. No acute vascular findings. Other:  None. Musculoskeletal:  No suspicious bone lesions identified. IMPRESSION: Mild acute pancreatitis. No evidence of pancreatic necrosis, pseudocyst, or other complication. No evidence of biliary ductal dilatation or  choledocholithiasis. Electronically Signed    By: Marlaine Hind M.D.   On: 02/07/2022 10:30   MR 3D Recon At Scanner  Result Date: 02/07/2022 CLINICAL DATA:  Pancreatitis. Possible choledocholithiasis on recent CT. EXAM: MRI ABDOMEN WITHOUT AND WITH CONTRAST (INCLUDING MRCP) TECHNIQUE: Multiplanar multisequence MR imaging of the abdomen was performed both before and after the administration of intravenous contrast. Heavily T2-weighted images of the biliary and pancreatic ducts were obtained, and three-dimensional MRCP images were rendered by post processing. CONTRAST:  7.74m GADAVIST GADOBUTROL 1 MMOL/ML IV SOLN COMPARISON:  CT on 02/05/2022 FINDINGS: Lower chest: No acute findings. Hepatobiliary: No hepatic masses identified. Gallbladder is unremarkable. No evidence of biliary ductal dilatation, with common bile duct measuring 4 mm in diameter. No evidence of choledocholithiasis. Pancreas: Mild peripancreatic edema is seen, consistent with acute pancreatitis. No evidence of pancreatic necrosis, pseudocyst, or mass. No evidence of pancreatic ductal dilatation or pancreas divisum. Spleen:  Within normal limits in size and appearance. Adrenals/Urinary Tract: No suspicious masses identified. No evidence of hydronephrosis. Stomach/Bowel: Unremarkable. Vascular/Lymphatic: No pathologically enlarged lymph nodes identified. No acute vascular findings. Other:  None. Musculoskeletal:  No suspicious bone lesions identified. IMPRESSION: Mild acute pancreatitis. No evidence of pancreatic necrosis, pseudocyst, or other complication. No evidence of biliary ductal dilatation or choledocholithiasis. Electronically Signed   By: JMarlaine HindM.D.   On: 02/07/2022 10:30    Assessment: s/p Procedure(s): LAPAROSCOPIC CHOLECYSTECTOMY WITH INTRAOPERATIVE CHOLANGIOGRAM Patient Active Problem List   Diagnosis Date Noted   Acute gallstone pancreatitis 02/05/2022   [place]ortic [place] 02/05/20[place]   Facial lesion 09/30/2021   Bilateral hand pain 09/30/2021    Impingement syndrome, shoulder, left 02/16/2021   Impingement syndrome, shoulder, right 09/07/2020   MDD (major depressive disorder) 09/02/2019   Generalized anxiety disorder 09/02/2019   Primary insomnia 08/16/2019   Post-menopausal 05/14/2017   Prediabetes 01/12/2017   Snoring 07/20/2016   Obsessive-compulsive personality trait 12/07/2015   Primary localized osteoarthritis of right knee    Essential hypertension 08/04/2015   Hyperlipidemia 08/04/2015   Osteopenia 08/04/2015   Anxiety associated with depression 08/04/2015    Expected post op course  Plan: Advance diet Discharge   LOS: 4 days     .[name] Adie M[facility] CitySurgery, [place]   02/09/2022 7:56 AM

## 2022-02-10 ENCOUNTER — Telehealth: Payer: Self-pay

## 2022-02-10 NOTE — Patient Outreach (Signed)
  Care Coordination TOC Note Transition Care Management Unsuccessful Follow-up Telephone Call  Date of discharge and from where:  [facility] 02/05/22-02/09/22  Attempts:  1st Attempt  Reason for unsuccessful TCM follow-up call:  Left voice message  Johnney Killian, RN, BSN, CCM Care Management Coordinator [facility] Phone: [phone]: [phone]

## 2022-02-14 ENCOUNTER — Ambulatory Visit (INDEPENDENT_AMBULATORY_CARE_PROVIDER_SITE_OTHER): Payer: Medicare Other | Admitting: Medical-Surgical

## 2022-02-14 ENCOUNTER — Encounter: Payer: Self-pay | Admitting: Medical-Surgical

## 2022-02-14 ENCOUNTER — Telehealth: Payer: Self-pay

## 2022-02-14 VITALS — BP 132/74 | HR 88 | Resp 20 | Ht 62.0 in | Wt 173.8 lb

## 2022-02-14 DIAGNOSIS — Z09 Encounter for follow-up examination after completed treatment for conditions other than malignant neoplasm: Secondary | ICD-10-CM | POA: Diagnosis not present

## 2022-02-14 NOTE — Progress Notes (Signed)
   Established Patient Office Visit  Subjective   Patient ID: [name], female   DOB: 03/15/1945 Age: 77 y.o. MRN: [id number]   Chief Complaint  Patient presents with   Hospitalization Follow-up    Gallbladder Surgery   HPI Pleasant 77 year old female senting today for hospital follow-up discharge.  She was seen in urgent care on 12/24 with abdominal pain.  She was referred to the ED for she was admitted for chronic cholecystitis.  She underwent a laparoscopic cholecystectomy and was discharged on 12/28.  Since she returned home, she reports that she is recovering well and has had very little pain.  She is limited in her activity due to recovery and lifting limitations.  Was advised not to bend frequently but does catch herself doing this more often.  Her biggest complaint now is that she is very fatigued and feeling weak.  She is eating and drinking without difficulty.  No complaints of further abdominal pain or diarrhea.  Has an appointment with general surgery coming up.   Objective:    Vitals:   02/14/22 1354  BP: (!) 158/71  Pulse: 86  Resp: 20  Height: '5\' 2"'$  (1.575 m)  Weight: 173 lb 12.8 oz (78.8 kg)  SpO2: 99%  BMI (Calculated): 31.78   Physical Exam Vitals and nursing note reviewed.  Constitutional:      General: She is not in acute distress.    Appearance: Normal appearance. She is obese. She is not ill-appearing.  HENT:     Head: Normocephalic and atraumatic.  Cardiovascular:     Rate and Rhythm: Normal rate and regular rhythm.     Pulses: Normal pulses.     Heart sounds: Normal heart sounds.  Pulmonary:     Effort: Pulmonary effort is normal. No respiratory distress.     Breath sounds: Normal breath sounds. No wheezing, rhonchi or rales.  Skin:    General: Skin is warm and dry.  Neurological:     Mental Status: She is alert and oriented to person, place, and time.  Psychiatric:        Mood and Affect: Mood normal.        Behavior: Behavior normal.         Thought Content: Thought content normal.        Judgment: Judgment normal.   No results found for this or any previous visit (from the past 24 hour(s)).     The 10-year ASCVD risk score (Arnett DK, et al., 2019) is: 60.2%   Values used to calculate the score:     Age: 19 years     Sex: Female     Is Non-Hispanic African American: No     Diabetic: Yes     Tobacco smoker: No     Systolic Blood Pressure: 716 mmHg     Is BP treated: Yes     HDL Cholesterol: 88 mg/dL     Total Cholesterol: 201 mg/dL   Assessment & Plan:   1. Hospital discharge follow-up Hospital records reviewed and discussed with patient.  She is healing very well and her laparoscopic incisions are looking great.  Recommend following up with general surgery as instructed.  Work on staying well-hydrated and getting adequate nutrition.  Increase activity as tolerated.  Rest as much as needed.  Return if symptoms worsen or fail to improve.  ___________________________________________ Clearnce Sorrel, DNP, APRN, FNP-BC [facility] [place]

## 2022-02-14 NOTE — Patient Outreach (Signed)
  Care Coordination TOC Note Transition Care Management Follow-up Telephone Call Date of discharge and from where: [facility] 02/05/22-02/09/22 How have you been since you were released from the hospital? "I am tired but feel so much better" Any questions or concerns? No  Items Reviewed: Did the pt receive and understand the discharge instructions provided? Yes  Medications obtained and verified? Yes  Other? No  Any new allergies since your discharge? No  Dietary orders reviewed? Yes Do you have support at home? Yes   Home Care and Equipment/Supplies: Were home health services ordered? not applicable If so, what is the name of the agency? N/a  Has the agency set up a time to come to the patient's home? not applicable Were any new equipment or medical supplies ordered?  No What is the name of the medical supply agency? N/a Were you able to get the supplies/equipment? not applicable Do you have any questions related to the use of the equipment or supplies? No  Functional Questionnaire: (I = Independent and D = Dependent) ADLs: I  Bathing/Dressing- I  Meal Prep- I  Eating- I  Maintaining continence- I  Transferring/Ambulation- I  Managing Meds- I  Follow up appointments reviewed:  PCP Hospital f/u appt confirmed? Yes  Scheduled to see Samuel Bouche on 02/14/22 @        1:45. [facility] f/u appt confirmed? No   Are transportation arrangements needed? No  If their condition worsens, is the pt aware to call PCP or go to the Emergency Dept.? Yes Was the patient provided with contact information for the PCP's office or ED? Yes Was to pt encouraged to call back with questions or concerns? Yes  SDOH assessments and interventions completed:   Yes SDOH Interventions Today    Flowsheet Row Most Recent Value  SDOH Interventions   Housing Interventions Intervention Not Indicated  Transportation Interventions Intervention Not Indicated       Care Coordination  Interventions:  No Care Coordination interventions needed at this time.   Encounter Outcome:  Pt. Visit Completed

## 2022-02-24 ENCOUNTER — Other Ambulatory Visit: Payer: Self-pay | Admitting: Medical-Surgical

## 2022-02-27 ENCOUNTER — Ambulatory Visit: Payer: Self-pay | Admitting: *Deleted

## 2022-02-27 ENCOUNTER — Encounter: Payer: Self-pay | Admitting: Medical-Surgical

## 2022-02-27 NOTE — Telephone Encounter (Signed)
  Chief Complaint: night sweat since surgery 12/28 Symptoms: night sweats- can wake several times per night Frequency: nightly Pertinent Negatives: Patient denies fever Disposition: '[]'$ ED /'[]'$ Urgent Care (no appt availability in office) / '[]'$ Appointment(In office/virtual)/ '[]'$  [facility]/ '[]'$ Home Care/ '[]'$ Refused Recommended Disposition /'[]'$ [facility]/ '[x]'$  Follow-up with PCP Additional Notes: Patient advised call her PCP for follow up

## 2022-02-27 NOTE — Telephone Encounter (Signed)
Summary: night sweats after surgery   Pt had gallbladder surgery on the 28th of dec.  Since then she has been having night sweats.  Surgeon office is closed today.  CB@  [phone]     Reason for Disposition  [1] NIGHT SWEATS occur (e.g., drenching sweat that occurs at night and has to change bed clothes or bed sheets) AND [2] cause unknown  Answer Assessment - Initial Assessment Questions 1. ONSET: "When did the sweating start?"      Started several days after surgery- intermittent, just at night, was having some before 2. LOCATION: "What part of your body has excessive sweating?" (e.g., entire body; just face, underarms, palms, or soles of feet).      Neck and chest 3. SEVERITY: "How bad is the sweating?"    (Scale 1-10; or mild, moderate, severe)   -  MILD (1-3): Sweating doesn't interfere with normal activities.    -  MODERATE (4-7): Sweating interferes with normal activities (e.g., work or school) or awakens from sleep; causes embarrassment in social situations.    -  SEVERE (8-10): Drenching sweat and has to change bed clothes or bed linens.   - NIGHT SWEATS: Drenching sweat that occurs at night and has to change bed clothes or bed sheets.     Can be damp all over 4. CAUSE: "What do you think is causing the sweating?"     Possible post surgical 5. FEVER: "Have you been having fevers?" If Yes, ask: "What is your temperature, how was it measured, and when did it start?"     no 6. OTHER SYMPTOMS: "Do you have any other symptoms?" (e.g., chest pain, difficulty breathing, lightheadedness, weight loss)     No other symptoms  Protocols used: Sweating-A-AH

## 2022-03-09 ENCOUNTER — Ambulatory Visit
Admission: RE | Admit: 2022-03-09 | Discharge: 2022-03-09 | Disposition: A | Discharge status: Home / Self Care | Payer: Medicare Other | Source: Ambulatory Visit | Attending: Medical-Surgical | Admitting: Medical-Surgical

## 2022-03-09 DIAGNOSIS — Z1231 Encounter for screening mammogram for malignant neoplasm of breast: Secondary | ICD-10-CM

## 2022-03-17 ENCOUNTER — Telehealth (INDEPENDENT_AMBULATORY_CARE_PROVIDER_SITE_OTHER): Payer: Medicare Other | Admitting: Psychiatry

## 2022-03-17 ENCOUNTER — Encounter (HOSPITAL_COMMUNITY): Payer: Self-pay | Admitting: Psychiatry

## 2022-03-17 DIAGNOSIS — F5101 Primary insomnia: Secondary | ICD-10-CM | POA: Diagnosis not present

## 2022-03-17 DIAGNOSIS — F331 Major depressive disorder, recurrent, moderate: Secondary | ICD-10-CM

## 2022-03-17 DIAGNOSIS — F418 Other specified anxiety disorders: Secondary | ICD-10-CM | POA: Diagnosis not present

## 2022-03-17 MED ORDER — ZOLPIDEM TARTRATE 5 MG PO TABS: 7.5000 mg | ORAL_TABLET | Freq: Every evening | ORAL | 2 refills | Status: DC | PRN

## 2022-03-17 MED ORDER — VENLAFAXINE HCL ER 150 MG PO CP24: 150.0000 mg | ORAL_CAPSULE | Freq: Every day | ORAL | 3 refills | Status: DC

## 2022-03-17 NOTE — Progress Notes (Signed)
Geraldine MD/PA/NP OP Progress Note  Virtual Visit via Video Note  I connected with [name] on [birth year]03 at  8:30 AM EST by a video enabled telemedicine application and verified that I am speaking with the correct person using two identifiers.  Location: Patient: Home Provider: Clinic   I discussed the limitations of evaluation and management by telemedicine and the availability of in person appointments. The patient expressed understanding and agreed to proceed.  I provided 30 minutes of non-face-to-face time during this encounter.       03/17/2022 4:74 AM [name]  MRN:  [id number]  Chief Complaint: "Things are about the same"    HPI: 77 year old female seen today for follow up psychiatric evaluation.   She has a psychiatric history of insomnia, anxiety, depression, and passive SI.  She is currently being managed on, Effexor 150 mg daily, Klonopin 0.5-1 twice daily (perscribed by PCP), and Ambien 7.5 mg nightly. She notes her medications are effective in managing her psychiatric conditions.    Today she logged in virtually but her camera was turned off. During exam she is pleasant, cooperative, and engaged in conversation.  She informed Probation officer that things are about the same.  She informed Probation officer that she continues to have poor sleep but notes that she is looking forward to doing CBT-I.  She reports that she missed her appointment because she had emergency gallbladder surgery.  Patient notes that she is healing well from her surgery and notes that she has minimal pain.   Overall she notes that her mood is stable and reports that she has minimal anxiety and depression.  Today provider conducted a GAD-7 and patient scored a 9.  Provider also conducted PHQ 9 and patient scored a 7.  Today she denies SI/HI/VAH, mania, or paranoia.    No medication changes made today.  Patient agreeable to continue medication as prescribed.  No other concerns at this time.         Visit Diagnosis:    ICD-10-CM   1. Anxiety associated with depression  F41.8 venlafaxine XR (EFFEXOR-XR) 150 MG 24 hr capsule    2. Moderate episode of recurrent major depressive disorder (HCC)  F33.1 venlafaxine XR (EFFEXOR-XR) 150 MG 24 hr capsule    3. Primary insomnia  F51.01 zolpidem (AMBIEN) 5 MG tablet      Past Psychiatric History:  insomnia, anxiety, depression, and passive SI.  Past Medical History:  Past Medical History:  Diagnosis Date   Anxiety    Depression    Headache    SINUS   Hyperlipidemia    Hypertension    Osteopenia    Primary localized osteoarthritis of right knee     Past Surgical History:  Procedure Laterality Date   BREAST EXCISIONAL BIOPSY Right    benign cyst removal    CARPAL TUNNEL RELEASE Right 2015   Ortho in [place] N/A 02/08/2022   Procedure: LAPAROSCOPIC CHOLECYSTECTOMY WITH INTRAOPERATIVE CHOLANGIOGRAM;  Surgeon: Leighton Ruff, MD;  Location: [facility];  Service: General;  Laterality: N/A;   LASIK     MENISCUS REPAIR Right 2011   Orthopedic in [place] Right 09/20/2015   TOTAL KNEE ARTHROPLASTY Right 09/20/2015   Procedure: TOTAL KNEE ARTHROPLASTY;  Surgeon: Elsie Saas, MD;  Location: [place];  Service: Orthopedics;  Laterality: Right;   Trigger Thumb Right 2015   Done in [facility] at the same time as the carpal tunnel release   [place]  Family Psychiatric History: Mother anxiety, maternal aunts alcohol use, maternal aunt anorexia Family History:  Family History  Problem Relation Age of Onset   Heart failure Mother    Heart attack Mother    Heart attack Father    Irregular heart beat Sister    Hypertension Sister    Heart disease Brother    Alcohol abuse Maternal Aunt     Social History:  Social History   Socioeconomic History   Marital status: Married    Spouse name: [name]   Number of children: 1   Years of education: 17.5   Highest education level: Master's  degree (e.g., MA, MS, MEng, MEd, MSW, MBA)  Occupational History   Occupation: retired    Comment: Licensed conveyancer  Tobacco Use   Smoking status: Never   Smokeless tobacco: Never  Vaping Use   Vaping Use: Never used  Substance and Sexual Activity   Alcohol use: No    Comment: 1 glass a month   Drug use: No   Sexual activity: Yes  Other Topics Concern   Not on file  Social History Narrative   Reads paper, walks, housework errands. Caffeine use daily   Social Determinants of Health   Financial Resource Strain: Low Risk  (02/27/2020)   Overall Financial Resource Strain (CARDIA)    Difficulty of Paying Living Expenses: Not hard at all  Food Insecurity: No Food Insecurity (02/05/2022)   Hunger Vital Sign    Worried About Running Out of Food in the Last Year: Never true    Ran Out of Food in the Last Year: Never true  Transportation Needs: No Transportation Needs (02/14/2022)   PRAPARE - Hydrologist (Medical): No    Lack of Transportation (Non-Medical): No  Physical Activity: Inactive (02/27/2020)   Exercise Vital Sign    Days of Exercise per Week: 0 days    Minutes of Exercise per Session: 0 min  Stress: No Stress Concern Present (02/27/2020)   [place]    Feeling of Stress : Not at all  Social Connections: Moderately Isolated (02/27/2020)   Social Connection and Isolation Panel [NHANES]    Frequency of Communication with Friends and Family: Three times a week    Frequency of Social Gatherings with Friends and Family: Once a week    Attends Religious Services: Never    Marine scientist or Organizations: No    Attends Archivist Meetings: Never    Marital Status: Married    Allergies:  Allergies  Allergen Reactions   Penicillins Hives    Tolerates Cephalosporins    Prednisone Other (See Comments)    Hyperactivity, sleep disturbance, confusion   Sulfa Antibiotics  Hives         Metabolic Disorder Labs: Lab Results  Component Value Date   HGBA1C 5.5 06/30/2021   MPG 131 01/17/2019   MPG 128 11/28/2017   No results found for: "PROLACTIN" Lab Results  Component Value Date   CHOL 201 (H) 06/30/2021   TRIG 95 06/30/2021   HDL 88 06/30/2021   CHOLHDL 2.3 06/30/2021   LDLCALC 94 06/30/2021   LDLCALC 77 06/30/2020   Lab Results  Component Value Date   TSH 4.01 01/08/2017   TSH 1.93 12/07/2015    Therapeutic Level Labs: No results found for: "LITHIUM" No results found for: "VALPROATE" No results found for: "CBMZ"  Current Medications: Current Outpatient Medications  Medication Sig Dispense Refill  amLODipine-benazepril (LOTREL) 5-20 MG capsule Take 1 capsule by mouth daily. 90 capsule 3   atorvastatin (LIPITOR) 20 MG tablet TAKE ONE TABLET BY MOUTH ONE TIME A DAY (Patient taking differently: Take 20 mg by mouth daily.) 90 tablet 3   clonazePAM (KLONOPIN) 0.5 MG tablet TAKE 1/2 TO 1 TABLET BY MOUTH TWO TIMES A DAY AS NEEDED 35 tablet 2   fluticasone (FLONASE) 50 MCG/ACT nasal spray Place 1 spray into both nostrils daily as needed for allergies or rhinitis.     oxyCODONE (OXY IR/ROXICODONE) 5 MG immediate release tablet Take 0.5-1 tablets (2.5-5 mg total) by mouth every 4 (four) hours as needed for moderate pain or severe pain. 15 tablet 0   raloxifene (EVISTA) 60 MG tablet TAKE 1 TABLET BY MOUTH DAILY 90 tablet 0   triamterene-hydrochlorothiazide (DYAZIDE) 37.5-25 MG capsule TAKE ONE CAPSULE BY MOUTH ONE TIME A DAY (Patient taking differently: Take 1 capsule by mouth daily.) 90 capsule 3   venlafaxine XR (EFFEXOR-XR) 150 MG 24 hr capsule Take 1 capsule (150 mg total) by mouth daily with breakfast. 90 capsule 3   zolpidem (AMBIEN) 5 MG tablet Take 1.5 tablets (7.5 mg total) by mouth at bedtime as needed for sleep. 45 tablet 2   No current facility-administered medications for this visit.     Musculoskeletal: Strength & Muscle Tone:   Unable to assess camera off ,  Gait & Station:  Unable to assess camera off ,  Patient leans: N/A  Psychiatric Specialty Exam: Review of Systems  There were no vitals taken for this visit.There is no height or weight on file to calculate BMI.  General Appearance:  Unable to assess camera off  Eye Contact:   Unable to assess camera off  Speech:  Clear and Coherent and Normal Rate  Volume:  Normal  Mood:  Euthymic  Affect:  Appropriate and Congruent  Thought Process:  Coherent, Goal Directed and Linear  Orientation:  Full (Time, Place, and Person)  Thought Content: WDL and Logical   Suicidal Thoughts:  No  Homicidal Thoughts:  No  Memory:  Immediate;   Good Recent;   Good Remote;   Good  Judgement:  Good  Insight:  Good  Psychomotor Activity:   Unable to assess camera off  Concentration:  Concentration: Good and Attention Span: Good  Recall:  Good  Fund of Knowledge: Good  Language: Good  Akathisia:   Unable to assess camera off  Handed:  Right  AIMS (if indicated): Not done  Assets:  Communication Skills Desire for Improvement Financial Resources/Insurance Housing Social Support  ADL's:  Intact  Cognition: WNL  Sleep:  Fair   Screenings: GAD-7    Flowsheet Row Video Visit from 03/17/2022 in [facility] Office Visit from 02/14/2022 in [place] at [place] Visit from 11/11/2021 in [place] at [facility] Video Visit from 10/19/2021 in [facility] Office Visit from 09/30/2021 in [place] at [facility]  Total GAD-7 Score '9 7 6 16 8      '$ PHQ2-9    Flowsheet Row Video Visit from 03/17/2022 in [facility] Office Visit from 02/14/2022 in [place] at [place] Visit from 11/11/2021 in [place] at [facility] Video Visit from 10/19/2021 in [facility] Office Visit  from 09/30/2021 in [place] at [facility] Total Score '2 2 2 4 3  '$ PHQ-9 Total Score '7 12 11 16 11      '$ Flowsheet Row ED to Hosp-Admission (Discharged) from 02/05/2022 in [place] ED from 12/25/2021 in [facility] Video Visit from 10/19/2021 in [place] No Risk No Risk Error: Q7 should not be populated when Q6 is No        Assessment and Plan: Patient reports that her sleep continues to poor but is hopeful that CBT-I will help. No medication changes made today.  Patient agreeable to continue medication as prescribed.  No other concerns at this time.  1. Anxiety associated with depression  Continue- venlafaxine XR (EFFEXOR-XR) 150 MG 24 hr capsule; Take 1 capsule (150 mg total) by mouth daily with breakfast.  Dispense: 90 capsule; Refill: 3  2. Moderate episode of recurrent major depressive disorder (HCC)  Continue- venlafaxine XR (EFFEXOR-XR) 150 MG 24 hr capsule; Take 1 capsule (150 mg total) by mouth daily with breakfast.  Dispense: 90 capsule; Refill: 3  3. Primary insomnia  Continue- zolpidem (AMBIEN) 5 MG tablet; Take 1.5 tablets (7.5 mg total) by mouth at bedtime as needed for sleep.  Dispense: 45 tablet; Refill: 2      Follow-up in 3 months   Salley Slaughter, NP 03/17/2022, 8:32 AM

## 2022-04-09 ENCOUNTER — Other Ambulatory Visit: Payer: Self-pay | Admitting: Medical-Surgical

## 2022-04-17 ENCOUNTER — Encounter: Payer: Self-pay | Admitting: Family Medicine

## 2022-04-17 ENCOUNTER — Ambulatory Visit (INDEPENDENT_AMBULATORY_CARE_PROVIDER_SITE_OTHER): Payer: Medicare Other | Admitting: Family Medicine

## 2022-04-17 VITALS — BP 130/60 | HR 92 | Temp 97.5°F | Ht 62.0 in | Wt 171.0 lb

## 2022-04-17 DIAGNOSIS — R7303 Prediabetes: Secondary | ICD-10-CM | POA: Diagnosis not present

## 2022-04-17 DIAGNOSIS — I10 Essential (primary) hypertension: Secondary | ICD-10-CM | POA: Diagnosis not present

## 2022-04-17 DIAGNOSIS — R1084 Generalized abdominal pain: Secondary | ICD-10-CM

## 2022-04-17 DIAGNOSIS — F5101 Primary insomnia: Secondary | ICD-10-CM

## 2022-04-17 DIAGNOSIS — F418 Other specified anxiety disorders: Secondary | ICD-10-CM

## 2022-04-17 DIAGNOSIS — E7800 Pure hypercholesterolemia, unspecified: Secondary | ICD-10-CM | POA: Diagnosis not present

## 2022-04-17 LAB — CBC WITH DIFFERENTIAL/PLATELET
Basophils Absolute: 0.1 10*3/uL (ref 0.0–0.1)
Basophils Relative: 0.8 % (ref 0.0–3.0)
Eosinophils Absolute: 0.1 10*3/uL (ref 0.0–0.7)
Eosinophils Relative: 0.6 % (ref 0.0–5.0)
HCT: 39 % (ref 36.0–46.0)
Hemoglobin: 12.8 g/dL (ref 12.0–15.0)
Lymphocytes Relative: 26.8 % (ref 12.0–46.0)
Lymphs Abs: 2.3 10*3/uL (ref 0.7–4.0)
MCHC: 32.8 g/dL (ref 30.0–36.0)
MCV: 84.7 fl (ref 78.0–100.0)
Monocytes Absolute: 0.6 10*3/uL (ref 0.1–1.0)
Monocytes Relative: 6.6 % (ref 3.0–12.0)
Neutro Abs: 5.7 10*3/uL (ref 1.4–7.7)
Neutrophils Relative %: 65.2 % (ref 43.0–77.0)
Platelets: 381 10*3/uL (ref 150.0–400.0)
RBC: 4.6 Mil/uL (ref 3.87–5.11)
RDW: 15.7 % — ABNORMAL HIGH (ref 11.5–15.5)
WBC: 8.7 10*3/uL (ref 4.0–10.5)

## 2022-04-17 LAB — COMPREHENSIVE METABOLIC PANEL
ALT: 16 U/L (ref 0–35)
AST: 23 U/L (ref 0–37)
Albumin: 4.5 g/dL (ref 3.5–5.2)
Alkaline Phosphatase: 108 U/L (ref 39–117)
BUN: 22 mg/dL (ref 6–23)
CO2: 22 mEq/L (ref 19–32)
Calcium: 10.1 mg/dL (ref 8.4–10.5)
Chloride: 103 mEq/L (ref 96–112)
Creatinine, Ser: 1.38 mg/dL — ABNORMAL HIGH (ref 0.40–1.20)
GFR: 36.93 mL/min — ABNORMAL LOW (ref >60.00)
Glucose, Bld: 111 mg/dL — ABNORMAL HIGH (ref 70–99)
Potassium: 3.5 mEq/L (ref 3.5–5.1)
Sodium: 142 mEq/L (ref 135–145)
Total Bilirubin: 0.6 mg/dL (ref 0.2–1.2)
Total Protein: 7.7 g/dL (ref 6.0–8.3)

## 2022-04-17 LAB — LIPID PANEL
Cholesterol: 172 mg/dL (ref 0–200)
HDL: 69.8 mg/dL (ref >39.00)
LDL Cholesterol: 77 mg/dL (ref 0–99)
NonHDL: 101.96
Total CHOL/HDL Ratio: 2
Triglycerides: 123 mg/dL (ref 0.0–149.0)
VLDL: 24.6 mg/dL (ref 0.0–40.0)

## 2022-04-17 LAB — TSH: TSH: 3.07 u[IU]/mL (ref 0.35–5.50)

## 2022-04-17 LAB — HEMOGLOBIN A1C: Hgb A1c MFr Bld: 6 % (ref 4.6–6.5)

## 2022-04-17 LAB — LIPASE: Lipase: 50 U/L (ref 11.0–59.0)

## 2022-04-17 MED ORDER — HYDROXYZINE HCL 10 MG PO TABS: 10.0000 mg | ORAL_TABLET | Freq: Three times a day (TID) | ORAL | 0 refills | Status: DC | PRN

## 2022-04-17 NOTE — Progress Notes (Unsigned)
New Patient Office Visit  Subjective:  Patient ID: [name], female    DOB: [birth year]  Age: 77 y.o. MRN: [id number]  CC:  Chief Complaint  Patient presents with   Establish Care    Need new pcp due to wanting someone closer to home Fasting Having been feeling sick since gallbladder surgery, unable to eat without feeling sick Not getting any sleep     HPI [name] presents for new pt-closer to home.  Gallbladder issues  Gallstone pancreatitis on 02/05/22-admit to hosp. Lipase up to 8000.  Had MRCP,cholecystectomy Recovering, but having issues w/nausea, constipation/diarrhea.  Can go 3-4 days w/o bm then small bm and then loose bowels. Feeling more bloated.  At times, not really wanting to eat, then "nerves" flared.   Scared of hospital. No f/c.  Some night sweats, hot and cold. No bloody stools.  Some "discomfort" abd, but not bad pain.  HTN-Pt is on lotrel, dyazide.  Bp's running not checking-anxiety.  No ha/dizziness/cp/palp/edema/cough.   Some chronic DOE  HLD-on lipitor '20mg'$  PreDM-sugars 110-125  when checks when wakes up w/sweats(husb meter). Insomnia-anx-taking ambien and not helping-2/d.  Then worries about not sleeping. Getting frantic about it.  Going on since 20's.  Working on CBT.  No SI.  Mirtazipine-felt "jumpy".  Has had sleep study.   3 wks ago, tripped and hit head-no ER.  No residual symptoms. No new neuro symptoms.   Past Medical History:  Diagnosis Date   Allergy 1980's   Sulfa drugs and penecillin   Anxiety    Cataract 2021   Corrected   Depression    Headache    SINUS   Hyperlipidemia    Hypertension    Osteopenia    Osteopenia    Primary localized osteoarthritis of right knee     Past Surgical History:  Procedure Laterality Date   BREAST EXCISIONAL BIOPSY Right    benign cyst removal    CARPAL TUNNEL RELEASE Right 2015   Ortho in [place] N/A 02/08/2022   Procedure: LAPAROSCOPIC CHOLECYSTECTOMY WITH  INTRAOPERATIVE CHOLANGIOGRAM;  Surgeon: Leighton Ruff, MD;  Location: [facility];  Service: General;  Laterality: N/A;   EYE SURGERY  12/2019   cataracts   JOINT REPLACEMENT  09/2015   Right knee   LASIK     MENISCUS REPAIR Right 2011   Orthopedic in [place] Right 09/20/2015   TOTAL KNEE ARTHROPLASTY Right 09/20/2015   Procedure: TOTAL KNEE ARTHROPLASTY;  Surgeon: Elsie Saas, MD;  Location: [place];  Service: Orthopedics;  Laterality: Right;   Trigger Thumb Right 2015   Done in [place] at the same time as the carpal tunnel release   TUBAL LIGATION  1980    Family History  Problem Relation Age of Onset   Heart disease Mother    Arthritis Mother    Heart failure Mother    Heart attack Mother    Anxiety disorder Mother    Depression Mother    Hearing loss Mother    Varicose Veins Mother    Early death Father    Heart attack Father    Hearing loss Father    Heart disease Father    Hyperlipidemia Sister    Heart disease Sister    Hearing loss Sister    Arthritis Sister    Irregular heart beat Sister    Hypertension Sister    Hypertension Brother    Hyperlipidemia Brother    Heart attack Brother  Asthma Brother    Alcohol abuse Brother    Heart disease Brother    Early death Brother    Heart attack Brother    Depression Son    Alcohol abuse Maternal Aunt     Social History   Socioeconomic History   Marital status: Married    Spouse name: Purcell Nails   Number of children: 1   Years of education: 17.5   Highest education level: Master's degree (e.g., MA, MS, MEng, MEd, MSW, MBA)  Occupational History   Occupation: retired    Comment: Licensed conveyancer  Tobacco Use   Smoking status: Never   Smokeless tobacco: Never  Vaping Use   Vaping Use: Never used  Substance and Sexual Activity   Alcohol use: Not Currently    Comment: 1 glass a month   Drug use: Yes    Types: Benzodiazepines   Sexual activity: Yes    Birth control/protection:  Post-menopausal, None  Other Topics Concern   Not on file  Social History Narrative   Reads paper, walks, housework errands. Caffeine use daily.   3 grands   Social Determinants of Health   Financial Resource Strain: Low Risk  (02/27/2020)   Overall Financial Resource Strain (CARDIA)    Difficulty of Paying Living Expenses: Not hard at all  Food Insecurity: No Food Insecurity (02/05/2022)   Hunger Vital Sign    Worried About Running Out of Food in the Last Year: Never true    Ran Out of Food in the Last Year: Never true  Transportation Needs: No Transportation Needs (02/14/2022)   PRAPARE - Hydrologist (Medical): No    Lack of Transportation (Non-Medical): No  Physical Activity: Inactive (02/27/2020)   Exercise Vital Sign    Days of Exercise per Week: 0 days    Minutes of Exercise per Session: 0 min  Stress: No Stress Concern Present (02/27/2020)   [place]    Feeling of Stress : Not at all  Social Connections: Moderately Isolated (02/27/2020)   Social Connection and Isolation Panel [NHANES]    Frequency of Communication with Friends and Family: Three times a week    Frequency of Social Gatherings with Friends and Family: Once a week    Attends Religious Services: Never    Marine scientist or Organizations: No    Attends Archivist Meetings: Never    Marital Status: Married  Human resources officer Violence: Not At Risk (02/05/2022)   Humiliation, Afraid, Rape, and Kick questionnaire    Fear of Current or Ex-Partner: No    Emotionally Abused: No    Physically Abused: No    Sexually Abused: No    ROS  ROS: Gen: no fever, chills  Skin: no rash, itching ENT: no ear pain, ear drainage, nasal congestion, rhinorrhea, sinus pressure, sore throat.  Wears HA Eyes: no blurry vision, double vision Resp: no cough, wheeze CV: no CP, palpitations, LE edema,  GI: hpi GU: no dysuria,  urgency, frequency, hematuria MSK: hands arthritis.  Neuro:some eye sinus issues.  2x/wk-not incapacitating.  Nothing helps.  Allergy testing negative.  Some dizziness-lack of sleep/anx.  Psych:HPI  Objective:   Today's Vitals: BP 130/60   Pulse 92   Temp (!) 97.5 F (36.4 C) (Temporal)   Ht '5\' 2"'$  (1.575 m)   Wt 171 lb (77.6 kg)   SpO2 98%   BMI 31.28 kg/m   Physical Exam  Gen: WDWN NAD wf  HEENT: NCAT, conjunctiva not injected, sclera nonicteric NECK:  supple, no thyromegaly, no nodes, no carotid bruits CARDIAC: RRR, S1S2+, no murmur. DP 2+B LUNGS: CTAB. No wheezes ABDOMEN:  BS+, soft, NTND, No HSM, no masses EXT:  no edema MSK: no gross abnormalities.  NEURO: A&O x3.  CN II-XII intact.  PSYCH: anxious mood. Good eye contact   Reviewed hosp nnotes  Assessment & Plan:   Problem List Items Addressed This Visit       Cardiovascular and Mediastinum   Essential hypertension - Primary   Relevant Orders   Comprehensive metabolic panel (Completed)   TSH (Completed)   CBC with Differential/Platelet (Completed)     Other   Hyperlipidemia   Relevant Orders   Comprehensive metabolic panel (Completed)   Lipid panel (Completed)   TSH (Completed)   Anxiety associated with depression   Relevant Medications   hydrOXYzine (ATARAX) 10 MG tablet   Prediabetes   Relevant Orders   Hemoglobin A1c (Completed)   TSH (Completed)   Primary insomnia   Other Visit Diagnoses     Generalized abdominal pain       Relevant Orders   DG Abd 2 Views   Comprehensive metabolic panel (Completed)   CBC with Differential/Platelet (Completed)   Lipase (Completed)     1.  Hypertension-chronic.  Well-controlled.  Continue Lotrel 5/20 and Dyazide 25/37.5 mg daily.  Check CBC, CMP, TSH 2.  Hyperlipidemia-chronic.  Controlled on Lipitor 20 mg daily.  Continue.  Check lipids, CMP, TSH 3.  Prediabetes-chronic.  Controlled.  She is having some night sweats-sugars are normal at that time.  Check  A1c, TSH 4.  History of gallstone pancreatitis-status post MRCP/cholecystectomy.  Recovering, but still not back to 100%.  Will check studies as below for abdominal pain 5.  Abdominal pain/stool changes-new since cholecystectomy.  Will have some constipation type symptoms, then diarrhea.  Advise diarrhea after cholecystectomy is common.  Will check abdominal films to make sure no partial obstruction.  Advised to use stool softeners (Colace 100 mg) daily, drink more water, get more activity, massage abdomen.  Check CBC, CMP, lipase.  Follow-up in 1 month 6.  Insomnia-chronic.  Not controlled.  This causes her great distress and anxiety.  She is seeing mental health.  Currently on Ambien and occasional clonazepam-this is not working for her.  She will be starting cognitive behavioral therapy.  She does have some mirtazapine at home.  She did not feel it was useful, but she did not try for very long.  Given her anxiety/duress-advised to try it again.  She is almost desperate for sleep.  Will also do hydroxyzine 10 mg at at bedtime-she is aware that this is not the best option due to age which also goes for many of her other meds. 7.  Anxiety disorder-chronic.  Not well-controlled.  Seeing mental health.  Currently on Effexor XR 150 mg daily.  Also has clonazepam-which she does not really take.  Continue care per mental health.  Outpatient Encounter Medications as of 04/17/2022  Medication Sig   amLODipine-benazepril (LOTREL) 5-20 MG capsule Take 1 capsule by mouth daily.   atorvastatin (LIPITOR) 20 MG tablet TAKE ONE TABLET BY MOUTH ONE TIME A DAY (Patient taking differently: Take 20 mg by mouth daily.)   CALCIUM PO Take 1,200 mg by mouth daily.   Cholecalciferol (D3 PO) Take 10,000 Units by mouth daily.   clonazePAM (KLONOPIN) 0.5 MG tablet TAKE 1/2 TO 1 TABLET BY MOUTH TWO TIMES A DAY AS NEEDED  fluticasone (FLONASE) 50 MCG/ACT nasal spray Place 1 spray into both nostrils daily as needed for allergies or  rhinitis.   hydrOXYzine (ATARAX) 10 MG tablet Take 1 tablet (10 mg total) by mouth 3 (three) times daily as needed.   Multiple Vitamin (MULTIVITAMIN ADULT PO) Take 1 tablet by mouth daily.   raloxifene (EVISTA) 60 MG tablet TAKE 1 TABLET BY MOUTH DAILY   triamterene-hydrochlorothiazide (DYAZIDE) 37.5-25 MG capsule TAKE ONE CAPSULE BY MOUTH ONE TIME A DAY (Patient taking differently: Take 1 capsule by mouth daily.)   venlafaxine XR (EFFEXOR-XR) 150 MG 24 hr capsule Take 1 capsule (150 mg total) by mouth daily with breakfast.   zolpidem (AMBIEN) 5 MG tablet Take 1.5 tablets (7.5 mg total) by mouth at bedtime as needed for sleep.   [DISCONTINUED] oxyCODONE (OXY IR/ROXICODONE) 5 MG immediate release tablet Take 0.5-1 tablets (2.5-5 mg total) by mouth every 4 (four) hours as needed for moderate pain or severe pain.   No facility-administered encounter medications on file as of 04/17/2022.    Follow-up: Return in about 4 weeks (around 05/15/2022) for mult.   Wellington Hampshire, MD

## 2022-04-17 NOTE — Patient Instructions (Addendum)
Welcome to [facility]! It was a pleasure meeting you today.  As discussed, Please schedule a 1 month follow up visit today.  Exercise 5 minutes/day and work your way up More water-add flavors Massage tummy  get X-ray/labs at [facility].  [place]  hours 8=M-F 8:30-5.  closed 12:30-1 lunch   Colace '100mg'$  daily-over the counter.   Try mirtazpine at bed for sleep again.  Let me know in 1 wk.   Try hydroxyzine for panic   PLEASE NOTE:  If you had any LAB tests please let us know if you have not heard back within a few days. You may see your results on MyChart before we have a chance to review them but we will give you a call once they are reviewed by [place]. If we ordered any REFERRALS today, please let us know if you have not heard from their office within the next week.  Let us know through MyChart if you are needing REFILLS, or have your pharmacy send [place] the request. You can also use MyChart to communicate with me or any office staff.  Please try these tips to maintain a healthy lifestyle:  Eat most of your calories during the day when you are active. Eliminate processed foods including packaged sweets (pies, cakes, cookies), reduce intake of potatoes, white bread, white pasta, and white rice. Look for whole grain options, oat flour or almond flour.  Each meal should contain half fruits/vegetables, one quarter protein, and one quarter carbs (no bigger than a computer mouse).  Cut down on sweet beverages. This includes juice, soda, and sweet tea. Also watch fruit intake, though this is a healthier sweet option, it still contains natural sugar! Limit to 3 servings daily.  Drink at least 1 glass of water with each meal and aim for at least 8 glasses per day  Exercise at least 150 minutes every week.

## 2022-04-18 NOTE — Progress Notes (Signed)
Labs mostly normal except: 1.  A1C(3 month average of sugars) is elevated.  This is considered PreDiabetes.  Work on diet-decrease sugars and starches and aim for 30 minutes of exercise 5 days/week to prevent progression to diabetes  2.  Kidney function a little elevated-make sure drinking plenty of water and will recheck next month when here.

## 2022-04-21 ENCOUNTER — Ambulatory Visit (INDEPENDENT_AMBULATORY_CARE_PROVIDER_SITE_OTHER)
Admission: RE | Admit: 2022-04-21 | Discharge: 2022-04-21 | Disposition: A | Discharge status: Home / Self Care | Payer: Medicare Other | Source: Ambulatory Visit | Attending: Family Medicine | Admitting: Family Medicine

## 2022-04-21 DIAGNOSIS — R1084 Generalized abdominal pain: Secondary | ICD-10-CM

## 2022-04-21 DIAGNOSIS — R109 Unspecified abdominal pain: Secondary | ICD-10-CM | POA: Diagnosis not present

## 2022-04-24 ENCOUNTER — Encounter: Payer: Self-pay | Admitting: Family Medicine

## 2022-05-15 ENCOUNTER — Ambulatory Visit (INDEPENDENT_AMBULATORY_CARE_PROVIDER_SITE_OTHER): Payer: Medicare Other | Admitting: Family Medicine

## 2022-05-15 ENCOUNTER — Encounter: Payer: Self-pay | Admitting: Family Medicine

## 2022-05-15 VITALS — BP 120/70 | HR 92 | Temp 97.7°F | Ht 62.0 in | Wt 176.4 lb

## 2022-05-15 DIAGNOSIS — R0989 Other specified symptoms and signs involving the circulatory and respiratory systems: Secondary | ICD-10-CM | POA: Diagnosis not present

## 2022-05-15 DIAGNOSIS — Z79899 Other long term (current) drug therapy: Secondary | ICD-10-CM | POA: Diagnosis not present

## 2022-05-15 DIAGNOSIS — K5904 Chronic idiopathic constipation: Secondary | ICD-10-CM | POA: Diagnosis not present

## 2022-05-15 DIAGNOSIS — I10 Essential (primary) hypertension: Secondary | ICD-10-CM | POA: Diagnosis not present

## 2022-05-15 DIAGNOSIS — F411 Generalized anxiety disorder: Secondary | ICD-10-CM

## 2022-05-15 DIAGNOSIS — M79641 Pain in right hand: Secondary | ICD-10-CM

## 2022-05-15 DIAGNOSIS — R61 Generalized hyperhidrosis: Secondary | ICD-10-CM | POA: Diagnosis not present

## 2022-05-15 DIAGNOSIS — M79642 Pain in left hand: Secondary | ICD-10-CM

## 2022-05-15 DIAGNOSIS — R7303 Prediabetes: Secondary | ICD-10-CM

## 2022-05-15 LAB — BASIC METABOLIC PANEL
BUN: 30 mg/dL — ABNORMAL HIGH (ref 6–23)
CO2: 23 mEq/L (ref 19–32)
Calcium: 9.3 mg/dL (ref 8.4–10.5)
Chloride: 103 mEq/L (ref 96–112)
Creatinine, Ser: 1.29 mg/dL — ABNORMAL HIGH (ref 0.40–1.20)
GFR: 40.02 mL/min — ABNORMAL LOW (ref >60.00)
Glucose, Bld: 96 mg/dL (ref 70–99)
Potassium: 3.5 mEq/L (ref 3.5–5.1)
Sodium: 139 mEq/L (ref 135–145)

## 2022-05-15 MED ORDER — METFORMIN HCL 500 MG PO TABS: 500.0000 mg | ORAL_TABLET | Freq: Every day | ORAL | 1 refills | Status: DC

## 2022-05-15 MED ORDER — LIDOCAINE VISCOUS HCL 2 % MT SOLN: 10.0000 mL | Freq: Four times a day (QID) | OROMUCOSAL | 3 refills | Status: DC | PRN

## 2022-05-15 NOTE — Progress Notes (Signed)
Subjective:     Patient ID: [name], female    DOB: Dec 19, 1945, 77 y.o.   MRN: [id number]  Chief Complaint  Patient presents with   Follow-up    Pt here for f/u with surgery     HPI-follow up doing much better  Night sweats have quit Constipation better w/water and walking-colace was too harsh. Depression/sleep/nerves better-sees psych next wk. Sleep fair.  Nervous/anx/irrit easily. Mirtazipine helps but increases hunger so patient cut in half. Weight-More active. In past, A1c was up.  On meds in past and wt improved but constipated whole time on it.  Struggling w/wt OA-on celebrex in past.  Hands hurt in cold weather.  Wants to know if can take it again In fall and Spring-tickle in throat.  Lidocaine 2% viscous soln-works great.  Allergy medications don't work and doesn't have runny nose or anything HYPERTENSION-on lotrel 5/20 and dyazide 37.5/25-doing well  Health Maintenance Due  Topic Date Due   Medicare Annual Wellness (AWV)  02/26/2021   DTaP/Tdap/Td (3 - Td or Tdap) 12/04/2021    Past Medical History:  Diagnosis Date   Allergy 1980's   Sulfa drugs and penecillin   Anxiety    Cataract 2021   Corrected   Depression    Headache    SINUS   Hyperlipidemia    Hypertension    Osteopenia    Osteopenia    Primary localized osteoarthritis of right knee     Past Surgical History:  Procedure Laterality Date   BREAST EXCISIONAL BIOPSY Right    benign cyst removal    CARPAL TUNNEL RELEASE Right 2015   Ortho in [place] N/A 02/08/2022   Procedure: [place] CHOLANGIOGRAM;  Surgeon: Leighton Ruff, MD;  Location: [facility];  Service: General;  Laterality: N/A;   EYE SURGERY  12/2019   cataracts   JOINT REPLACEMENT  09/2015   Right knee   LASIK     MENISCUS REPAIR Right 2011   Orthopedic in [place] Right 09/20/2015   TOTAL KNEE ARTHROPLASTY Right 09/20/2015   Procedure:  TOTAL KNEE ARTHROPLASTY;  Surgeon: Elsie Saas, MD;  Location: [place];  Service: Orthopedics;  Laterality: Right;   Trigger Thumb Right 2015   Done in [facility] at the same time as the carpal tunnel release   TUBAL LIGATION  1980    Outpatient Medications Prior to Visit  Medication Sig Dispense Refill   amLODipine-benazepril (LOTREL) 5-20 MG capsule Take 1 capsule by mouth daily. 90 capsule 3   atorvastatin (LIPITOR) 20 MG tablet TAKE ONE TABLET BY MOUTH ONE TIME A DAY (Patient taking differently: Take 20 mg by mouth daily.) 90 tablet 3   CALCIUM PO Take 1,200 mg by mouth daily.     Cholecalciferol (D3 PO) Take 10,000 Units by mouth daily.     clonazePAM (KLONOPIN) 0.5 MG tablet TAKE 1/2 TO 1 TABLET BY MOUTH TWO TIMES A DAY AS NEEDED 35 tablet 2   fluticasone (FLONASE) 50 MCG/ACT nasal spray Place 1 spray into both nostrils daily as needed for allergies or rhinitis.     Multiple Vitamin (MULTIVITAMIN ADULT PO) Take 1 tablet by mouth daily.     raloxifene (EVISTA) 60 MG tablet TAKE 1 TABLET BY MOUTH DAILY 90 tablet 0   triamterene-hydrochlorothiazide (DYAZIDE) 37.5-25 MG capsule TAKE ONE CAPSULE BY MOUTH ONE TIME A DAY (Patient taking differently: Take 1 capsule by mouth daily.) 90 capsule 3   venlafaxine XR (  EFFEXOR-XR) 150 MG 24 hr capsule Take 1 capsule (150 mg total) by mouth daily with breakfast. 90 capsule 3   zolpidem (AMBIEN) 5 MG tablet Take 1.5 tablets (7.5 mg total) by mouth at bedtime as needed for sleep. 45 tablet 2   hydrOXYzine (ATARAX) 10 MG tablet Take 1 tablet (10 mg total) by mouth 3 (three) times daily as needed. 30 tablet 0   No facility-administered medications prior to visit.    Allergies  Allergen Reactions   Penicillins Hives    Tolerates Cephalosporins    Prednisone Other (See Comments)    Hyperactivity, sleep disturbance, confusion   Sulfa Antibiotics Hives        ROS neg/noncontributory except as noted HPI/below      Objective:     BP 120/70 (BP  Location: Left Arm, Patient Position: Sitting)   Pulse 92   Temp 97.7 F (36.5 C) (Temporal)   Ht 5\' 2"  (1.575 m)   Wt 176 lb 6.4 oz (80 kg)   SpO2 98%   BMI 32.26 kg/m  Wt Readings from Last 3 Encounters:  05/15/22 176 lb 6.4 oz (80 kg)  04/17/22 171 lb (77.6 kg)  02/14/22 173 lb 12.8 oz (78.8 kg)    Physical Exam   Gen: [facility]N NAD HEENT: NCAT, conjunctiva not injected, sclera nonicteric NECK:  supple, no thyromegaly, no nodes, no carotid bruits CARDIAC: RRR, S1S2+, no murmur. DP 2+B LUNGS: CTAB. No wheezes ABDOMEN:  BS+, soft, NTND, No HSM, no masses EXT:  no edema MSK: no gross abnormalities.  NEURO: A&O x3.  CN II-XII intact.  PSYCH: normal mood. Good eye contact     Assessment & Plan:   Problem List Items Addressed This Visit       Cardiovascular and Mediastinum   Essential hypertension - Primary   Relevant Orders   Basic metabolic panel (Completed)     Other   Prediabetes   Generalized anxiety disorder   Bilateral hand pain   Other Visit Diagnoses     High risk medication use       Relevant Orders   Basic metabolic panel (Completed)   Night sweats       Chronic idiopathic constipation       Tickle in throat         1.  Hypertension-chronic.  Well-controlled on Lotrel 5/20 and Dyazide 37.5/25.  Creatinine was a little bit elevated.  Patient has been hydrating better.  Check BMP 2.  Night sweats-labs were unremarkable.  Have resolved.  Question if more from anxiety. 3.  Chronic constipation-Colace was too strong.  Walking more and drinking more water seems to have resolved. 4.  Depression/anxiety/insomnia-chronic.  Better controlled on meds.  Managed by psych.  She is sleeping a little better on mirtazapine (she does not know the dose).  However, it has increased her appetite so she decreased the medicine to half dose.  Discussed that the 7.5 mg dose does increase appetite and increasing the dose tends to improve that situation.  Advised to discuss with  psych. 5.  Prediabetes-she was on Ozempic in the past.  Advised that it would not be approved at this point as she is only prediabetic.  Needs to work on diet/exercise.  She is struggling.  Will do metformin 500 mg daily.  Monitor. 6.  Throat tickle-patient thinks this is due to allergies given its seasonality.  She would like a refill on viscous lidocaine 2%.  Prescription was written. 7.  Osteoarthritis-multiple sites but hand  pain seems to be what bothers her the most.  She does take Tylenol.  Celebrex 200 mg has worked well in the past, however creatinine has been a little elevated.  Advised that she should avoid this.  Will check creatinine to see if it has improved.  Follow-up in 6 months and as needed  Meds ordered this encounter  Medications   lidocaine (XYLOCAINE) 2 % solution    Sig: Use as directed 10 mLs in the mouth or throat every 6 (six) hours as needed for mouth pain.    Dispense:  100 mL    Refill:  3   metFORMIN (GLUCOPHAGE) 500 MG tablet    Sig: Take 1 tablet (500 mg total) by mouth daily with breakfast.    Dispense:  90 tablet    Refill:  1    Wellington Hampshire, MD

## 2022-05-15 NOTE — Patient Instructions (Signed)
It was very nice to see you today!  Added Lidocaine and metformin   PLEASE NOTE:  If you had any lab tests please let us know if you have not heard back within a few days. You may see your results on MyChart before we have a chance to review them but we will give you a call once they are reviewed by [place]. If we ordered any referrals today, please let us know if you have not heard from their office within the next week.   Please try these tips to maintain a healthy lifestyle:  Eat most of your calories during the day when you are active. Eliminate processed foods including packaged sweets (pies, cakes, cookies), reduce intake of potatoes, white bread, white pasta, and white rice. Look for whole grain options, oat flour or almond flour.  Each meal should contain half fruits/vegetables, one quarter protein, and one quarter carbs (no bigger than a computer mouse).  Cut down on sweet beverages. This includes juice, soda, and sweet tea. Also watch fruit intake, though this is a healthier sweet option, it still contains natural sugar! Limit to 3 servings daily.  Drink at least 1 glass of water with each meal and aim for at least 8 glasses per day  Exercise at least 150 minutes every week.

## 2022-05-17 ENCOUNTER — Other Ambulatory Visit: Payer: Self-pay | Admitting: *Deleted

## 2022-05-17 NOTE — Progress Notes (Signed)
Kidney function has improved some.  Make sure you are drinking plenty of water.  This is something we need to keep an eye on.  Try to avoid NSAIDs (Motrin, Aleve, Celebrex) recheck BMP in 3 months

## 2022-05-20 IMAGING — MG MM DIGITAL SCREENING BILAT W/ TOMO AND CAD
6 of 10 series · 6 of 30 positions shown · non-contrast
Comparison: Previous exam(s).

CLINICAL DATA: Screening.

EXAM:
DIGITAL SCREENING BILATERAL MAMMOGRAM WITH TOMOSYNTHESIS AND CAD
TECHNIQUE: Bilateral screening digital craniocaudal and mediolateral oblique
mammograms were obtained. Bilateral screening digital breast
tomosynthesis was performed. The images were evaluated with
computer-aided detection.

[R CC synth-2D (1 of 2)]
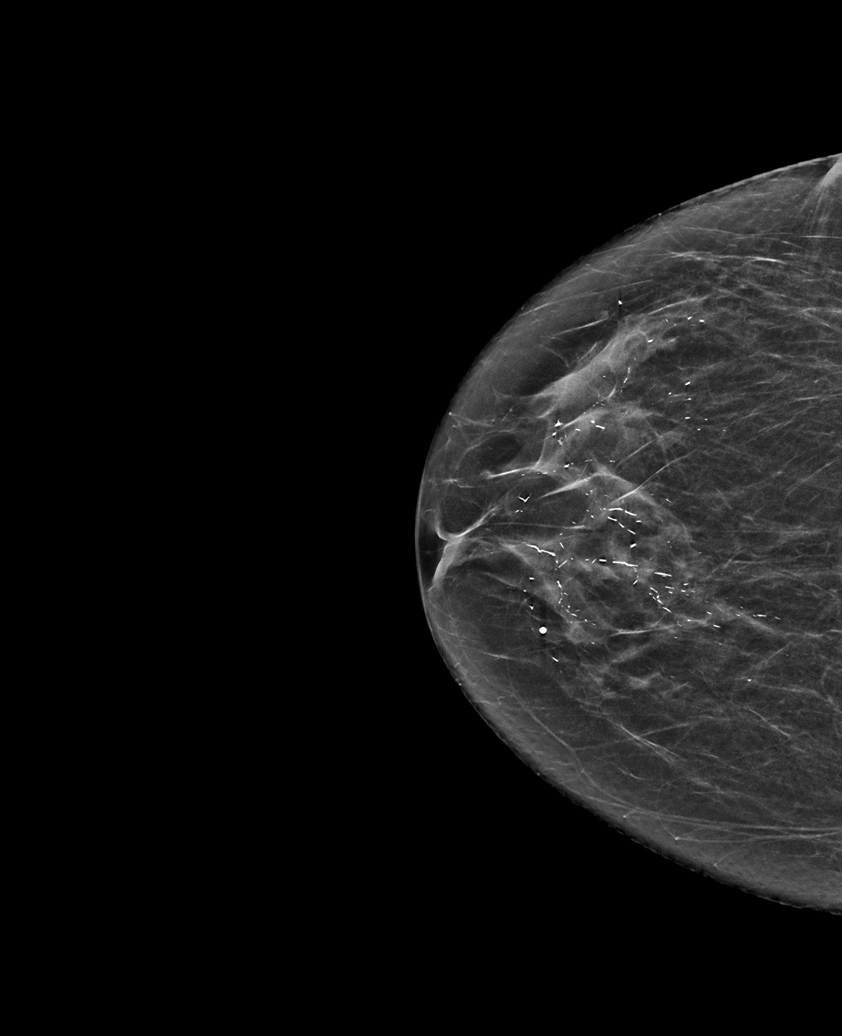

[R MLO synth-2D]
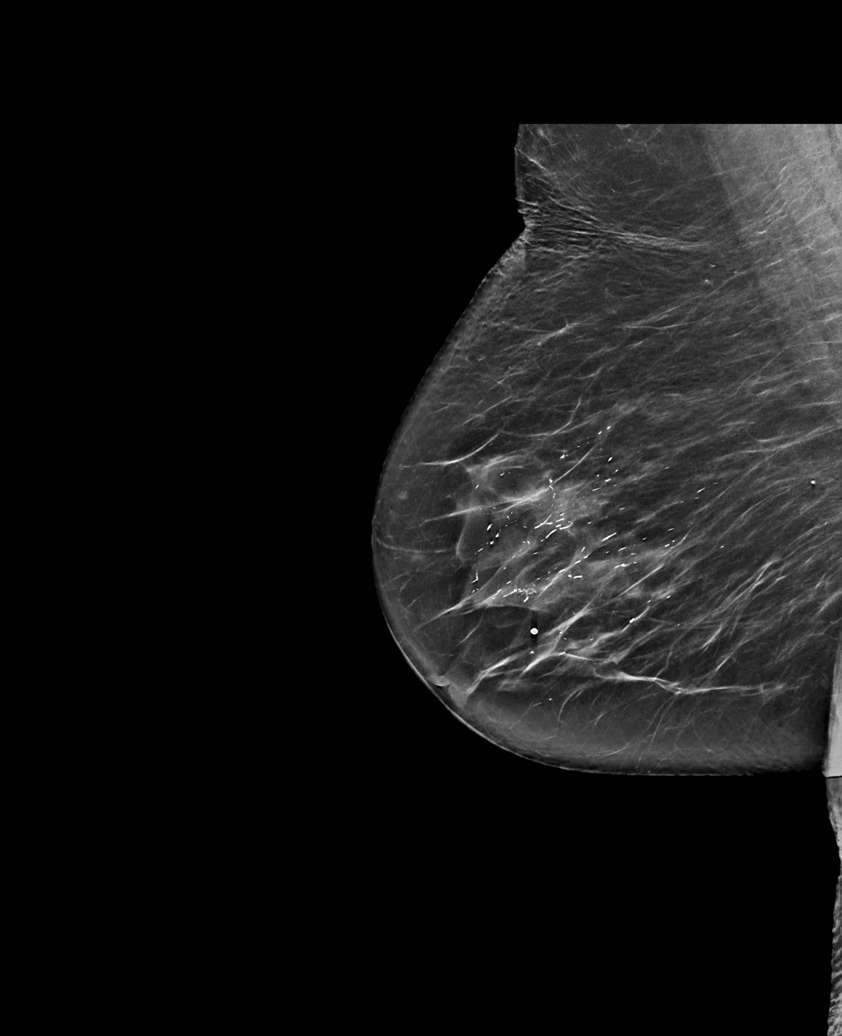

[L CC synth-2D]
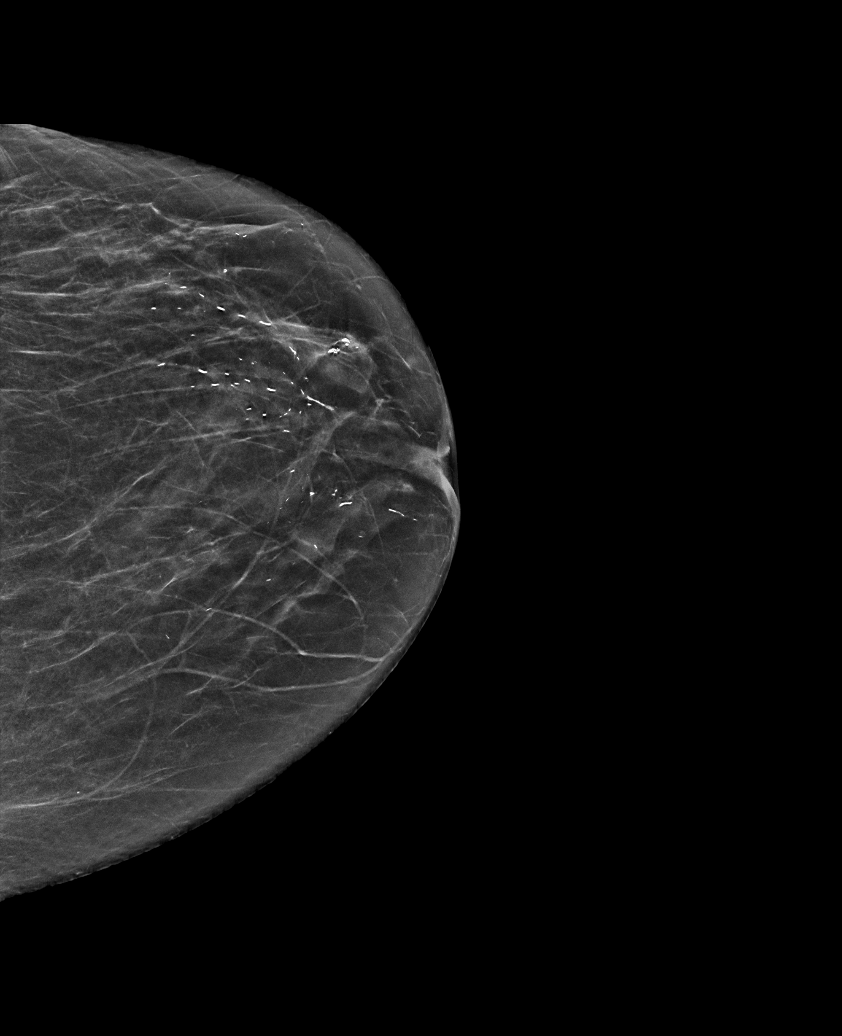

[L MLO synth-2D]
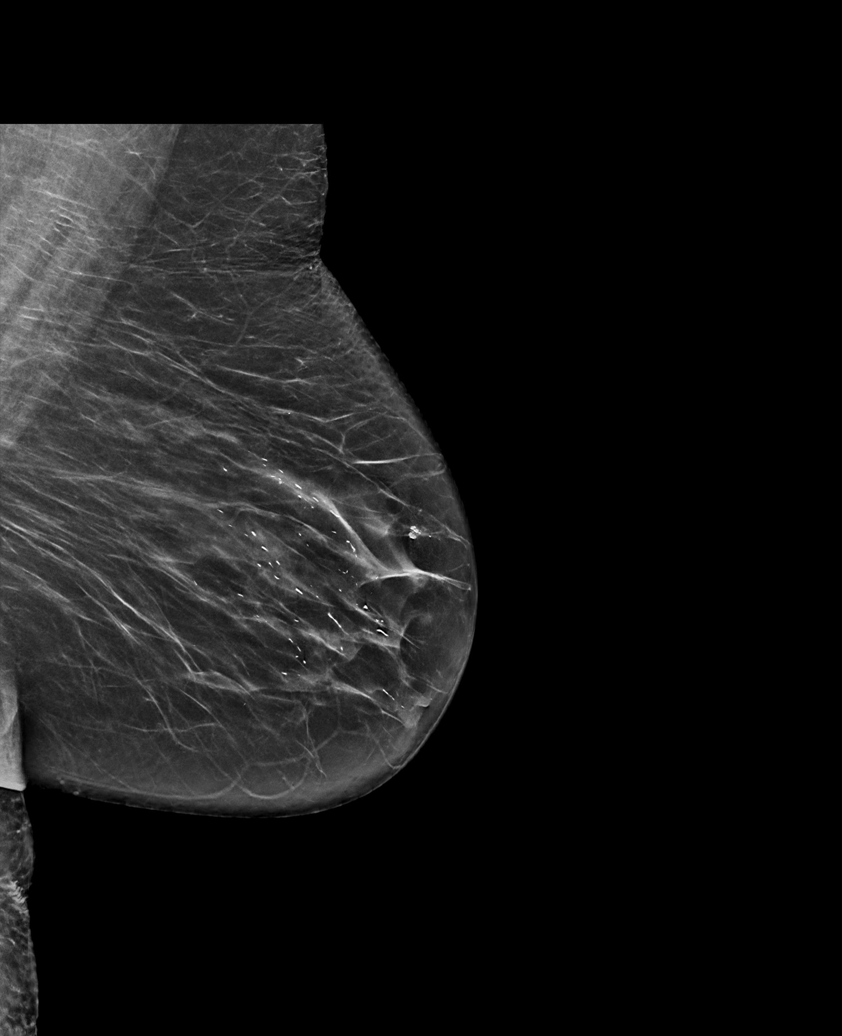

[R CC synth-2D (2 of 2)]
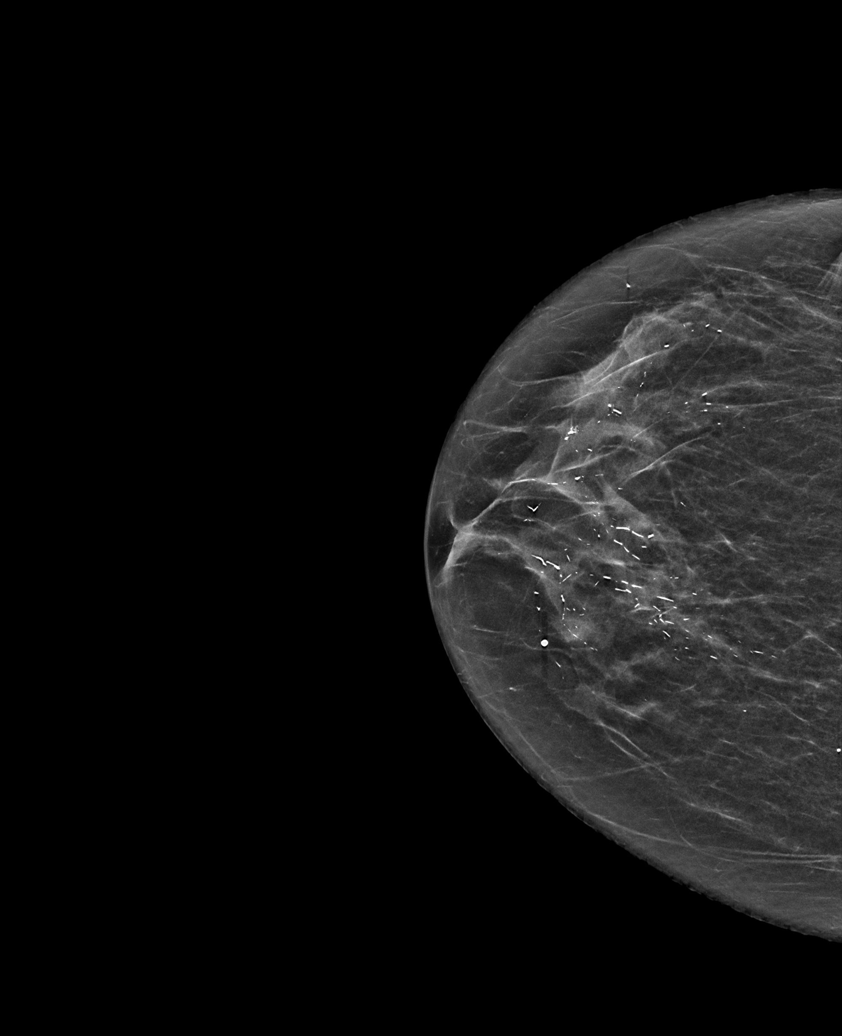

[R MLO tomo · tomo slice 35/68.0]
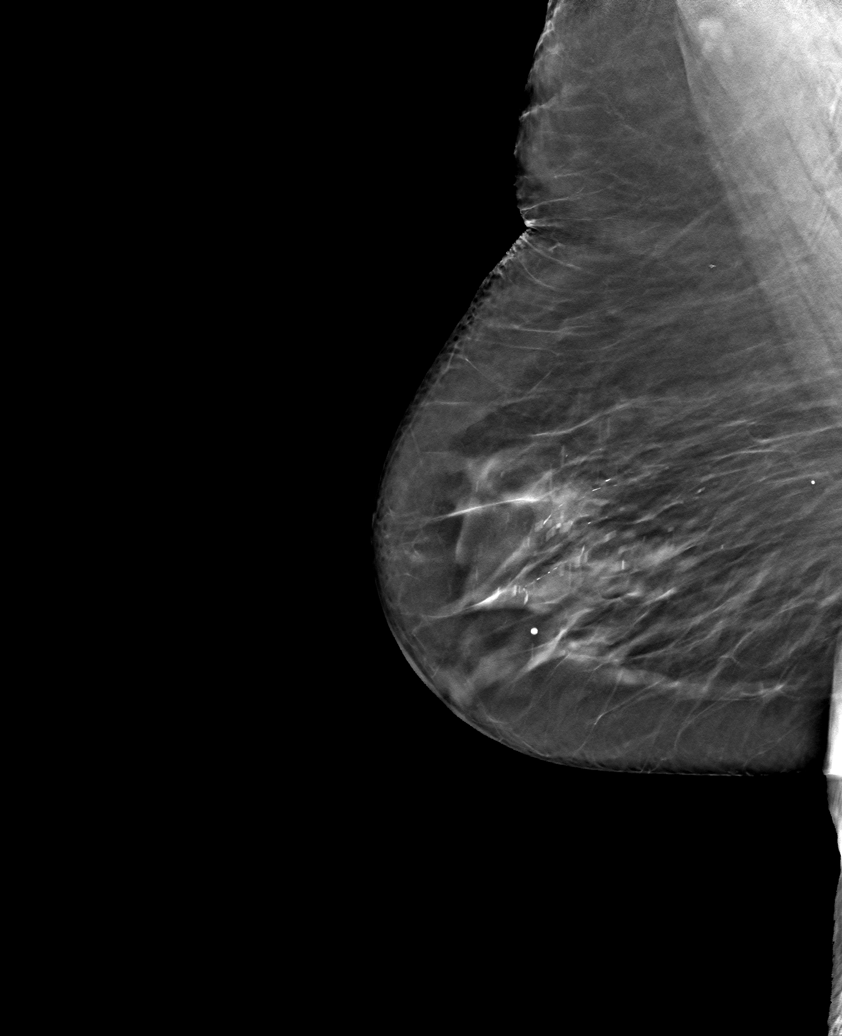

[6 of 30 positions shown; findings below may reference images not displayed]

ACR Breast Density Category b: There are scattered areas of
fibroglandular density.
FINDINGS: There are no findings suspicious for malignancy.
IMPRESSION: No mammographic evidence of malignancy. A result letter of this
screening mammogram will be mailed directly to the patient.

RECOMMENDATION:
Screening mammogram in one year. (Code:51-O-LD2)

BI-RADS CATEGORY  1: Negative.

## 2022-05-21 ENCOUNTER — Encounter: Payer: Self-pay | Admitting: Family Medicine

## 2022-05-22 ENCOUNTER — Other Ambulatory Visit: Payer: Self-pay | Admitting: Medical-Surgical

## 2022-05-31 ENCOUNTER — Telehealth (HOSPITAL_COMMUNITY): Payer: Self-pay | Admitting: *Deleted

## 2022-05-31 ENCOUNTER — Telehealth (HOSPITAL_COMMUNITY): Payer: Self-pay | Admitting: Psychiatry

## 2022-05-31 MED ORDER — GABAPENTIN 100 MG PO CAPS: ORAL_CAPSULE | ORAL | 0 refills | Status: DC

## 2022-05-31 NOTE — Telephone Encounter (Signed)
Patient called asking if she can get something for insomnia and panic attacks. Has been going on for several days straight and needs help before she gets worse. Next appointment May 2nd but needs help before then.

## 2022-05-31 NOTE — Telephone Encounter (Signed)
Received message from CMA that patient called reporting insomnia over past few nights with significant anxiety. Called patient and spoke for approx. 13 minutes: she reports she had gallbladder surgery in December with several complications including a fall and kidney issues that has raised general levels of anxiety. While she suffers from chronic insomnia, she reports that for the past few nights she has hardly been able to sleep at all and is worried she is going to begin having panic attacks (as insomnia has been a trigger for panic attacks in the past).  Reviewed current medications and past trials. Currently taking: Effexor 150 mg daily Ambien 7.5 mg qhs (feels this is not working)  No longer taking Klonopin (rx by previous PCP). She reports she has found this helpful in the past however discussed recommendation against this medication given risks especially in older adults.  Reports she was recently trialed on Remeron but took for a few weeks and didn't find it helpful/led to weight gain. Was also trialed on low-dose Atarax however reports it made her feel "jumpy." Denies desire to try either of these medications again.  She denies past trial of gabapentin - discussed off label use to help with sleep and anxiety. Reviewed risk of dizziness and drowsiness on this medication. Informed consent provided.  Plan: -- START gabapentin 100-300 mg nightly (encouraged to start at 100 mg and increase as needed/tolerated) -- Rescheduled to earlier appointment with this writer on 06/06/22 at 11AM in person  Daine Gip, MD 05/31/22

## 2022-06-05 NOTE — Progress Notes (Deleted)
BH MD Outpatient Progress Note  06/05/2022 9:48 AM Emma [name]  MRN:  [id number]  Assessment:  Emma [name] presents for follow-up evaluation. Today, 06/05/22, patient reports stability of mood and anxiety although continues to experience chronic insomnia. She identifies benefit from Ambien although sleep remains sub-optimal; she has undergone many medication trials for management of sleep. She has never participated in CBT-i and she was amenable to being provided with resources for potential therapists as well as retrial of melatonin but in extended release formulation. Counseled on risks of concurrent Ambien and Klonopin use; she states Klonopin is currently being slowly tapered by PCP and agree with ongoing taper and ultimate discontinuation of this medication. Will continue to explore other supports for sleep that may aid in eventual taper/discontinuation of Ambien as well given noted risks.   Plan to RTC in 2 months.  Identifying Information: Emma [name] is [name] 77 y.o. female with [name] history of MDD, anxiety, arthritis, HTN, and prediabetes who is an established patient with [facility] participating in follow-up via video conferencing.   Plan:  # MDD  Anxiety Past medication trials: Remeron (nausea, diarrhea, oversedation) Status of problem: stable Interventions: -- Continue Effexor 150 mg daily -- Patient prescribed Klonopin 0.25-0.5 BID PRN by PCP (reports using 1-2 times weekly and encouraged to reduce use as able especially given concurrent Ambien use)  # Insomnia Past medication trials: Restoril, Klonopin, Remeron (nausea, diarrhea, oversedation), doxepin (ineffective), trazodone (nausea, diarrhea, dizziness) Status of problem: chronic Interventions: -- Continue Ambien 7.5 mg nightly   -- Risks, benefits, and side effects including but not limited to drowsiness and daytime sedation, dizziness, decreased mental alertness, falls and  related injuries, increased risk for dementia was discussed  -- Recommend CBT for insomnia; provided with resources for CBT-i therapists in [place] -- Sleep hygiene reviewed -- Sleep study 05/10/20: mildest OSA; CPAP not recommended; could consider dental device  Patient was given contact information for behavioral health clinic and was instructed to call 911 for emergencies.   Subjective:  Chief Complaint:  No chief complaint on file.   Interval History:   Today, ***   Gabapentin 100-300 nightly Decrease ambien to 5 CBT-i   PDMP: -- Zolpidem 5 mg QTY 45 last filled 05/16/22(regular rx dating back to Jan 2023) -- Klonopin 0.5 mg QTY 35 last filled 04/23/22 (regular rx dating back to Nov 2021)  Visit Diagnosis:  No diagnosis found.   Past Psychiatric History:  Diagnoses: MDD with anxiety Medication trials: Restoril, Remeron (nausea, diarrhea, oversedation), doxepin (ineffective), trazodone (nausea, diarrhea, dizziness), melatonin Suicide attempts: denies Substance use: denies tobacco or illicit drug use; etoh: 1 glass/month  Past Medical History:  Past Medical History:  Diagnosis Date   Allergy 1980's   Sulfa drugs and penecillin   Anxiety    Cataract 2021   Corrected   Depression    Headache    SINUS   Hyperlipidemia    Hypertension    Osteopenia    Osteopenia    Primary localized osteoarthritis of right knee     Past Surgical History:  Procedure Laterality Date   BREAST EXCISIONAL BIOPSY Right    benign cyst removal    CARPAL TUNNEL RELEASE Right 2015   Ortho in [place]   CHOLECYSTECTOMY N/[name] 02/08/2022   Procedure: LAPAROSCOPIC CHOLECYSTECTOMY WITH INTRAOPERATIVE CHOLANGIOGRAM;  Surgeon: Romie Levee, MD;  Location: [facility];  Service: General;  Laterality: N/[name];   EYE SURGERY  12/2019   cataracts   JOINT REPLACEMENT  09/2015   Right knee   LASIK     MENISCUS REPAIR Right 2011   Orthopedic in [facility]   TOTAL KNEE ARTHROPLASTY Right 09/20/2015   TOTAL KNEE  ARTHROPLASTY Right 09/20/2015   Procedure: TOTAL KNEE ARTHROPLASTY;  Surgeon: Salvatore Marvel, MD;  Location: [facility] OR;  Service: Orthopedics;  Laterality: Right;   Trigger Thumb Right 2015   Done in [facility] at the same time as the carpal tunnel release   TUBAL LIGATION  1980    Family Psychiatric History:  Mothe: anxiety Maternal aunt: alcohol use, anorexia   Family History:  Family History  Problem Relation Age of Onset   Heart disease Mother    Arthritis Mother    Heart failure Mother    Heart attack Mother    Anxiety disorder Mother    Depression Mother    Hearing loss Mother    Varicose Veins Mother    Early death Father    Heart attack Father    Hearing loss Father    Heart disease Father    Hyperlipidemia Sister    Heart disease Sister    Hearing loss Sister    Arthritis Sister    Irregular heart beat Sister    Hypertension Sister    Hypertension Brother    Hyperlipidemia Brother    Heart attack Brother    Asthma Brother    Alcohol abuse Brother    Heart disease Brother    Early death Brother    Heart attack Brother    Depression Son    Alcohol abuse Maternal Aunt     Social History:  Social History   Socioeconomic History   Marital status: Married    Spouse name: Lawrence   Number of children: 1   Years of education: 17.5   Highest education level: Master's degree (e.g., MA, MS, MEng, MEd, MSW, MBA)  Occupational History   Occupation: retired    Comment: Comptroller  Tobacco Use   Smoking status: Never   Smokeless tobacco: Never  Vaping Use   Vaping Use: Never used  Substance and Sexual Activity   Alcohol use: Not Currently    Comment: 1 glass [name] month   Drug use: Yes    Types: Benzodiazepines   Sexual activity: Yes    Birth control/protection: Post-menopausal, None  Other Topics Concern   Not on file  Social History Narrative   Reads paper, walks, housework errands. Caffeine use daily.   3 grands   Social Determinants of Health   Financial  Resource Strain: Low Risk  (05/11/2022)   Overall Financial Resource Strain (CARDIA)    Difficulty of Paying Living Expenses: Not hard at all  Food Insecurity: No Food Insecurity (05/11/2022)   Hunger Vital Sign    Worried About Running Out of Food in the Last Year: Never true    Ran Out of Food in the Last Year: Never true  Transportation Needs: No Transportation Needs (05/11/2022)   PRAPARE - Administrator, Civil Service (Medical): No    Lack of Transportation (Non-Medical): No  Physical Activity: Insufficiently Active (05/11/2022)   Exercise Vital Sign    Days of Exercise per Week: 5 days    Minutes of Exercise per Session: 10 min  Stress: Stress Concern Present (05/11/2022)   [employer] of Occupational Health - Occupational Stress Questionnaire    Feeling of Stress : Rather much  Social Connections: Moderately Isolated (05/11/2022)   Social Connection and Isolation Panel [NHANES]    Frequency  of Communication with Friends and Family: Three times [name] week    Frequency of Social Gatherings with Friends and Family: Twice [name] week    Attends Religious Services: Never    Database administrator or Organizations: No    Attends Engineer, structural: Not on file    Marital Status: Married    Allergies:  Allergies  Allergen Reactions   Penicillins Hives    Tolerates Cephalosporins    Prednisone Other (See Comments)    Hyperactivity, sleep disturbance, confusion   Sulfa Antibiotics Hives         Current Medications: Current Outpatient Medications  Medication Sig Dispense Refill   amLODipine-benazepril (LOTREL) 5-20 MG capsule Take 1 capsule by mouth daily. 90 capsule 3   atorvastatin (LIPITOR) 20 MG tablet TAKE ONE TABLET BY MOUTH ONE TIME [name] DAY (Patient taking differently: Take 20 mg by mouth daily.) 90 tablet 3   CALCIUM PO Take 1,200 mg by mouth daily.     Cholecalciferol (D3 PO) Take 10,000 Units by mouth daily.     clonazePAM (KLONOPIN) 0.5 MG tablet  TAKE 1/2 TO 1 TABLET BY MOUTH TWO TIMES [name] DAY AS NEEDED (Patient not taking: Reported on 05/31/2022) 35 tablet 2   fluticasone (FLONASE) 50 MCG/ACT nasal spray Place 1 spray into both nostrils daily as needed for allergies or rhinitis.     gabapentin (NEURONTIN) 100 MG capsule Take 1-3 capsules (100-300 mg) nightly. 30 capsule 0   lidocaine (XYLOCAINE) 2 % solution Use as directed 10 mLs in the mouth or throat every 6 (six) hours as needed for mouth pain. 100 mL 3   metFORMIN (GLUCOPHAGE) 500 MG tablet Take 1 tablet (500 mg total) by mouth daily with breakfast. 90 tablet 1   Multiple Vitamin (MULTIVITAMIN ADULT PO) Take 1 tablet by mouth daily.     raloxifene (EVISTA) 60 MG tablet TAKE 1 TABLET BY MOUTH DAILY 90 tablet 0   triamterene-hydrochlorothiazide (DYAZIDE) 37.5-25 MG capsule TAKE ONE CAPSULE BY MOUTH ONE TIME [name] DAY (Patient taking differently: Take 1 capsule by mouth daily.) 90 capsule 3   venlafaxine XR (EFFEXOR-XR) 150 MG 24 hr capsule Take 1 capsule (150 mg total) by mouth daily with breakfast. 90 capsule 3   zolpidem (AMBIEN) 5 MG tablet Take 1.5 tablets (7.5 mg total) by mouth at bedtime as needed for sleep. 45 tablet 2   No current facility-administered medications for this visit.    ROS: Recovering from sinus infection  Objective:  Psychiatric Specialty Exam: There were no vitals taken for this visit.There is no height or weight on file to calculate BMI.  General Appearance: Casual and Well Groomed  Eye Contact:  Good  Speech:  Clear and Coherent and Normal Rate  Volume:  Normal  Mood:   "good"  Affect:   Euthymic  Thought Content:  Denies AVH; IOR; paranoia    Suicidal Thoughts:  No  Homicidal Thoughts:  No  Thought Process:  Goal Directed and Linear  Orientation:  Full (Time, Place, and Person)    Memory:   Grossly intact  Judgment:  Good  Insight:  Good  Concentration:  Concentration: Good  Recall:  NA  Fund of Knowledge: Good  Language: Good  Psychomotor  Activity:  Normal  Akathisia:  No  AIMS (if indicated): not done  Assets:  Communication Skills Desire for Improvement Financial Resources/Insurance Housing Intimacy Leisure Time Physical Health Resilience Social Support Talents/Skills Transportation  ADL's:  Intact  Cognition: WNL  Sleep:  Chronically disrupted   PE: General: sits comfortably in view of camera; no acute distress  Pulm: no increased work of breathing on room air  MSK: all extremity movements appear intact  Neuro: no focal neurological deficits observed  Gait & Station: unable to assess by video    Metabolic Disorder Labs: Lab Results  Component Value Date   HGBA1C 6.0 04/17/2022   MPG 131 01/17/2019   MPG 128 11/28/2017   No results found for: "PROLACTIN" Lab Results  Component Value Date   CHOL 172 04/17/2022   TRIG 123.0 04/17/2022   HDL 69.80 04/17/2022   CHOLHDL 2 04/17/2022   VLDL 24.6 04/17/2022   LDLCALC 77 04/17/2022   LDLCALC 94 06/30/2021   Lab Results  Component Value Date   TSH 3.07 04/17/2022   TSH 4.01 01/08/2017    Therapeutic Level Labs: No results found for: "LITHIUM" No results found for: "VALPROATE" No results found for: "CBMZ"  Screenings:  GAD-7    Flowsheet Row Office Visit from 04/17/2022 in [facility] Video Visit from 03/17/2022 in [facility] Office Visit from 02/14/2022 in [facility] at [facility] Office Visit from 11/11/2021 in [facility] Primary Care & Sports Medicine at [facility] Video Visit from 10/19/2021 in [facility]  Total GAD-7 Score 18 9 7 6 16       PHQ2-9    Flowsheet Row Office Visit from 04/17/2022 in [facility] Video Visit from 03/17/2022 in [facility] Office Visit from 02/14/2022 in [facility] at [facility] Office Visit  from 11/11/2021 in [facility] Primary Care & Sports Medicine at [facility] Video Visit from 10/19/2021 in [facility]  PHQ-2 Total Score 4 2 2 2 4   PHQ-9 Total Score 15 7 12 11 16       Flowsheet Row ED to Hosp-Admission (Discharged) from 02/05/2022 in [facility] ED from 12/25/2021 in [facility] Video Visit from 10/19/2021 in [facility]  C-SSRS RISK CATEGORY No Risk No Risk Error: Q7 should not be populated when Q6 is No       Collaboration of Care: Collaboration of Care: Medication Management AEB ongoing medication management and Psychiatrist AEB established with this provider  Patient/Guardian was advised Release of Information must be obtained prior to any record release in order to collaborate their care with an outside provider. Patient/Guardian was advised if they have not already done so to contact the registration department to sign all necessary forms in order for [place] to release information regarding their care.   Consent: Patient/Guardian gives verbal consent for treatment and assignment of benefits for services provided during this visit. Patient/Guardian expressed understanding and agreed to proceed.   [name] total of *** minutes was spent involved in face to face clinical care, chart review, documentation, brief motivational interviewing, and medication management.   Emma [name]  06/05/2022, 9:48 AM

## 2022-06-06 ENCOUNTER — Telehealth (HOSPITAL_COMMUNITY): Payer: Self-pay | Admitting: *Deleted

## 2022-06-06 ENCOUNTER — Encounter (HOSPITAL_COMMUNITY): Payer: Medicare Other | Admitting: Psychiatry

## 2022-06-06 NOTE — Telephone Encounter (Signed)
Patient called left a voicemail stating that she was prescribed Gabapentin recently and it has made her "sick to her stomach." Wants the Psychiatrist to be made aware. Also had an appointment today but is needing to cancel and reschedule. Message sent to MD.

## 2022-06-09 ENCOUNTER — Ambulatory Visit (INDEPENDENT_AMBULATORY_CARE_PROVIDER_SITE_OTHER): Payer: Medicare Other | Admitting: Family Medicine

## 2022-06-09 ENCOUNTER — Encounter: Payer: Self-pay | Admitting: Family Medicine

## 2022-06-09 VITALS — BP 140/70 | HR 87 | Temp 97.9°F | Resp 16 | Ht 62.0 in | Wt 181.0 lb

## 2022-06-09 DIAGNOSIS — M79641 Pain in right hand: Secondary | ICD-10-CM

## 2022-06-09 DIAGNOSIS — M8589 Other specified disorders of bone density and structure, multiple sites: Secondary | ICD-10-CM

## 2022-06-09 DIAGNOSIS — M79642 Pain in left hand: Secondary | ICD-10-CM | POA: Diagnosis not present

## 2022-06-09 DIAGNOSIS — F411 Generalized anxiety disorder: Secondary | ICD-10-CM

## 2022-06-09 DIAGNOSIS — F5101 Primary insomnia: Secondary | ICD-10-CM | POA: Diagnosis not present

## 2022-06-09 DIAGNOSIS — M159 Polyosteoarthritis, unspecified: Secondary | ICD-10-CM

## 2022-06-09 MED ORDER — ATORVASTATIN CALCIUM 20 MG PO TABS
20.0000 mg | ORAL_TABLET | Freq: Every day | ORAL | 3 refills | Status: DC
  Filled 2023-01-18: qty 90, 90d supply, fill #0
  Filled 2023-04-07: qty 90, 90d supply, fill #1

## 2022-06-09 MED ORDER — RALOXIFENE HCL 60 MG PO TABS
60.0000 mg | ORAL_TABLET | Freq: Every day | ORAL | 3 refills | Status: DC
  Filled 2023-01-08: qty 90, 90d supply, fill #0
  Filled 2023-04-02: qty 90, 90d supply, fill #1

## 2022-06-09 MED ORDER — TRIAMTERENE-HCTZ 37.5-25 MG PO CAPS
1.0000 | ORAL_CAPSULE | Freq: Every day | ORAL | 3 refills | Status: DC
  Filled 2023-01-16: qty 90, 90d supply, fill #0
  Filled 2023-04-07: qty 90, 90d supply, fill #1

## 2022-06-09 MED ORDER — AMLODIPINE BESY-BENAZEPRIL HCL 5-20 MG PO CAPS
1.0000 | ORAL_CAPSULE | Freq: Every day | ORAL | 3 refills | Status: DC
  Filled 2022-11-09: qty 90, 90d supply, fill #0
  Filled 2023-02-01: qty 90, 90d supply, fill #1
  Filled 2023-04-30: qty 90, 90d supply, fill #2

## 2022-06-09 NOTE — Patient Instructions (Addendum)
Ginger, tumeric Tylenol.   Magnesium daily - 200-400 mg daily.   Sleep:  consider seroquel,  rozarem.

## 2022-06-09 NOTE — Progress Notes (Signed)
Subjective:     Patient ID: [name], female    DOB: 02-08-1946, 77 y.o.   MRN: [id number]  Chief Complaint  Patient presents with   GI Problem    Continued GI sx since having gallbladder surgery    Panic Attack    Possible panick attack on yesterday   Medication Reaction    Stopped gabapentin due to how it made her feel, shaky, was prescribed for insomnia     HPI muscle aches/pains-hands bad, other places as well, but hands worst.  Had trigger finger releases in past.  Can't take Celebrex(which worked) due to CKD. Headache(s). Sinuses-chronic.    Panic attack yesterday-called psychiatry-concerned about the SE to gabapentin.   insomnia-had talked to  psychiatry and added gabapentin.  After 4 days, confused, shakey, etc.  So stopped on 4/22. Still feels "off" but improved. Osteopenia-on Evista-needs refill  Health Maintenance Due  Topic Date Due   Medicare Annual Wellness (AWV)  02/26/2021   DTaP/Tdap/Td (3 - Td or Tdap) 12/04/2021    Past Medical History:  Diagnosis Date   Allergy 1980's   Sulfa drugs and penecillin   Anxiety    Cataract 2021   Corrected   Depression    Headache    SINUS   Hyperlipidemia    Hypertension    Osteopenia    Osteopenia    Primary localized osteoarthritis of right knee     Past Surgical History:  Procedure Laterality Date   BREAST EXCISIONAL BIOPSY Right    benign cyst removal    CARPAL TUNNEL RELEASE Right 2015   Ortho in [place]   CHOLECYSTECTOMY N/A 02/08/2022   Procedure: LAPAROSCOPIC CHOLECYSTECTOMY WITH INTRAOPERATIVE CHOLANGIOGRAM;  Surgeon: Romie Levee, MD;  Location: [facility];  Service: General;  Laterality: N/A;   EYE SURGERY  12/2019   cataracts   JOINT REPLACEMENT  09/2015   Right knee   LASIK     MENISCUS REPAIR Right 2011   Orthopedic in [facility]   TOTAL KNEE ARTHROPLASTY Right 09/20/2015   TOTAL KNEE ARTHROPLASTY Right 09/20/2015   Procedure: TOTAL KNEE ARTHROPLASTY;  Surgeon: Salvatore Marvel, MD;   Location: [facility] OR;  Service: Orthopedics;  Laterality: Right;   Trigger Thumb Right 2015   Done in [facility] at the same time as the carpal tunnel release   TUBAL LIGATION  1980     Current Outpatient Medications:    CALCIUM PO, Take 1,200 mg by mouth daily., Disp: , Rfl:    Cholecalciferol (D3 PO), Take 10,000 Units by mouth daily., Disp: , Rfl:    clonazePAM (KLONOPIN) 0.5 MG tablet, TAKE 1/2 TO 1 TABLET BY MOUTH TWO TIMES A DAY AS NEEDED, Disp: 35 tablet, Rfl: 2   fluticasone (FLONASE) 50 MCG/ACT nasal spray, Place 1 spray into both nostrils daily as needed for allergies or rhinitis., Disp: , Rfl:    lidocaine (XYLOCAINE) 2 % solution, Use as directed 10 mLs in the mouth or throat every 6 (six) hours as needed for mouth pain., Disp: 100 mL, Rfl: 3   mirtazapine (REMERON) 15 MG tablet, Take 15 mg by mouth at bedtime. Taking 1/2 tab, Disp: , Rfl:    Multiple Vitamin (MULTIVITAMIN ADULT PO), Take 1 tablet by mouth daily., Disp: , Rfl:    venlafaxine XR (EFFEXOR-XR) 150 MG 24 hr capsule, Take 1 capsule (150 mg total) by mouth daily with breakfast., Disp: 90 capsule, Rfl: 3   zolpidem (AMBIEN) 5 MG tablet, Take 1.5 tablets (7.5 mg total) by mouth  at bedtime as needed for sleep., Disp: 45 tablet, Rfl: 2   amLODipine-benazepril (LOTREL) 5-20 MG capsule, Take 1 capsule by mouth daily., Disp: 90 capsule, Rfl: 3   atorvastatin (LIPITOR) 20 MG tablet, Take 1 tablet (20 mg total) by mouth daily., Disp: 90 tablet, Rfl: 3   raloxifene (EVISTA) 60 MG tablet, Take 1 tablet (60 mg total) by mouth daily., Disp: 90 tablet, Rfl: 3   triamterene-hydrochlorothiazide (DYAZIDE) 37.5-25 MG capsule, TAKE ONE CAPSULE BY MOUTH ONE TIME A DAY, Disp: 90 capsule, Rfl: 3  Allergies  Allergen Reactions   Penicillins Hives    Tolerates Cephalosporins    Prednisone Other (See Comments)    Hyperactivity, sleep disturbance, confusion   Sulfa Antibiotics Hives        ROS neg/noncontributory except as noted HPI/below       Objective:     BP (!) 140/70   Pulse 87   Temp 97.9 F (36.6 C) (Temporal)   Resp 16   Ht 5\' 2"  (1.575 m)   Wt 181 lb (82.1 kg)   SpO2 99%   BMI 33.11 kg/m  Wt Readings from Last 3 Encounters:  06/09/22 181 lb (82.1 kg)  05/15/22 176 lb 6.4 oz (80 kg)  04/17/22 171 lb (77.6 kg)    Physical Exam   Gen: WDWN NAD HEENT: NCAT, conjunctiva not injected, sclera nonicteric NECK:  supple, no thyromegaly, no nodes, no carotid bruits CARDIAC: RRR, S1S2+, no murmur. DP 2+B LUNGS: CTAB. No wheezes ABDOMEN:  BS+, soft, NTND, No HSM, no masses EXT:  no edema MSK: no gross abnormalities.  Left hand-from trigger releases, distorted. Metacarpophalangeal joint's enlarged B.  +Heberden's B.  Tender to squeeze.  NEURO: A&O x3.  CN II-XII intact.  PSYCH: normal mood. Good eye contact     Assessment & Plan:  Primary osteoarthritis involving multiple joints -     Rheumatoid factor -     C-reactive protein; Future -     Sedimentation rate; Future  Bilateral hand pain  Generalized anxiety disorder  Primary insomnia  Osteopenia of multiple sites  Other orders -     amLODIPine Besy-Benazepril HCl; Take 1 capsule by mouth daily.  Dispense: 90 capsule; Refill: 3 -     Atorvastatin Calcium; Take 1 tablet (20 mg total) by mouth daily.  Dispense: 90 tablet; Refill: 3 -     Raloxifene HCl; Take 1 tablet (60 mg total) by mouth daily.  Dispense: 90 tablet; Refill: 3 -     Triamterene-HCTZ; TAKE ONE CAPSULE BY MOUTH ONE TIME A DAY  Dispense: 90 capsule; Refill: 3  1.  Osteoarthritis involving multiple joints/worsening hand pain-chronic but worsening.  Cannot take NSAIDs due to CKD.  Continue Tylenol.  Add turmeric and ginger and magnesium.  Will check CRP, ESR, rheumatoid factor.  May need to see rheumatologist versus hand surgeon. 2.  Bilateral hand pain-see above 3.  Generalized anxiety disorder-panic attack/near panic attack yesterday-patient is managed by psychiatry.  Advised to call them.   Some of the anxiety was due to the side effects and concerns with gabapentin.  Reassurance. 4.  Primary insomnia-patient is already on Ambien and mirtazapine-advised to follow-up with psychiatry.  She has never tried Seroquel or Rozerem.  Defer to psychiatry 5.  Osteopenia-chronic.  Taking Evista 60 mg daily.  Renewed  Angelena Sole, MD

## 2022-06-10 LAB — C-REACTIVE PROTEIN: CRP: <3 mg/L (ref <8.0)

## 2022-06-10 LAB — RHEUMATOID FACTOR: Rheumatoid fact SerPl-aCnc: <10 IU/mL (ref <14)

## 2022-06-10 LAB — SEDIMENTATION RATE: Sed Rate: 34 mm/h — ABNORMAL HIGH (ref 0–30)

## 2022-06-11 ENCOUNTER — Encounter: Payer: Self-pay | Admitting: Family Medicine

## 2022-06-11 DIAGNOSIS — M159 Polyosteoarthritis, unspecified: Secondary | ICD-10-CM | POA: Insufficient documentation

## 2022-06-11 NOTE — Progress Notes (Signed)
Rheumatoid negative.  Sed rate little elevated but probably more normal for her age.  Does she want referral to hand surgeon?

## 2022-06-12 ENCOUNTER — Other Ambulatory Visit: Payer: Self-pay | Admitting: *Deleted

## 2022-06-12 DIAGNOSIS — M79641 Pain in right hand: Secondary | ICD-10-CM

## 2022-06-12 NOTE — Progress Notes (Unsigned)
BH MD Outpatient Progress Note  06/13/2022 3:56 PM [name]  MRN:  [id number]  Assessment:  [name] presents for follow-up evaluation. Today, 06/13/22, patient reports she was unable to tolerate gabapentin. She identifies initial benefit from Remeron for sleep and nighttime anxiety although notes side effect of significant appetite increase; as higher dosing may better target anxiety with decreased impact on appetite, will trial increase at this time (while carefully monitoring for impacts on sleep). Given numerous past medication trials for sleep, strongly recommend CBT-i and patient reports she has identified local center and will reach out to schedule appointment.   RTC in 5 weeks in person.  Identifying Information: [name] is a 77 y.o. female with a history of MDD, anxiety, arthritis, HTN, and prediabetes who is an established patient with [facility] participating in follow-up via video conferencing.   Plan:  # MDD  GAD Past medication trials: Remeron (nausea, diarrhea, oversedation); Klonopin Status of problem: acute exacerbation Interventions: -- Continue Effexor 150 mg daily -- INCREASE Remeron from 15 mg to 30 mg nightly to better target anxiety; continue to monitor effects on sleep and appetite -- No longer taking Klonopin  # Insomnia Past medication trials: Restoril, Klonopin, Remeron (nausea, diarrhea, oversedation), doxepin (ineffective), trazodone (nausea, diarrhea, dizziness) Status of problem: chronic Interventions: -- Remeron as above -- Continue Ambien 7.5 mg nightly   -- Risks, benefits, and side effects including but not limited to drowsiness and daytime sedation, dizziness, decreased mental alertness, falls and related injuries, increased risk for dementia was discussed   -- Have discussed plan for gradual taper and discontinuation of this medication over time given lack of indication for long term use --  Patient to reach back out to schedule appointment for CBT for insomnia; provided with additional resources for CBT-i therapists in [place] -- Sleep hygiene reviewed -- Sleep study 05/10/20: mildest OSA; CPAP not recommended; could consider dental device  Patient was given contact information for behavioral health clinic and was instructed to call 911 for emergencies.   Subjective:  Chief Complaint:  Chief Complaint  Patient presents with   Medication Management    Interval History:   Today, patient reports she took gabapentin 200 mg for 4 evenings however made her feel "disoriented and dopey" so stopped it. These effects have since worn off. Sleep was about the same when taking gabapentin.   Sleep overall has been "up and down" getting 4-5 hours nightly. Taking Ambien 7.5 mg nightly. Denies naps during the day. Anxiety has been a bit elevated since having surgery in December; a lot of anxiety around sleep but notes she can fixate on almost anything. Has been taking Remeron for a few months now - feels it may have been helpful for sleep although notes side effect of weight gain. Did initially notice benefit for anxiety at night however feels this may have worn off.  Reports mood is "fine" when sleeping and feels that once anxiety/sleep is better controlled mood will be better. Denies SI, HI, AVH.   Discussed mechanism of Remeron and ability for higher doses to more effectively treat nighttime anxiety while minimizing appetite impacts. Amenable to trialing increase while maintaining other medications at current doses. Discussed ultimate plan to taper and discontinue Ambien. She had previously identified CBT-I therapist however had to cancel appt when she had surgery; expresses intent to reach back out to reschedule.  PDMP: -- Zolpidem 5 mg QTY 45 last filled 05/16/22 (regular rx dating back to  Jan 2023) -- Klonopin 0.5 mg QTY 35 last filled 04/23/22 (regular rx dating back to Nov 2021)  Visit  Diagnosis:    ICD-10-CM   1. Generalized anxiety disorder  F41.1     2. Moderate episode of recurrent major depressive disorder (HCC)  F33.1     3. Primary insomnia  F51.01 zolpidem (AMBIEN) 5 MG tablet      Past Psychiatric History:  Diagnoses: MDD with anxiety Medication trials: Restoril, Remeron (nausea, diarrhea, oversedation), doxepin (ineffective), trazodone (nausea, diarrhea, dizziness), melatonin Suicide attempts: denies Substance use: denies tobacco or illicit drug use; etoh: 1 glass/month  Past Medical History:  Past Medical History:  Diagnosis Date   Allergy 1980's   Sulfa drugs and penecillin   Anxiety    Cataract 2021   Corrected   Depression    Headache    SINUS   Hyperlipidemia    Hypertension    Osteopenia    Osteopenia    Primary localized osteoarthritis of right knee     Past Surgical History:  Procedure Laterality Date   BREAST EXCISIONAL BIOPSY Right    benign cyst removal    CARPAL TUNNEL RELEASE Right 2015   Ortho in [place]   CHOLECYSTECTOMY N/A 02/08/2022   Procedure: LAPAROSCOPIC CHOLECYSTECTOMY WITH INTRAOPERATIVE CHOLANGIOGRAM;  Surgeon: Romie Levee, MD;  Location: [facility];  Service: General;  Laterality: N/A;   EYE SURGERY  12/2019   cataracts   JOINT REPLACEMENT  09/2015   Right knee   LASIK     MENISCUS REPAIR Right 2011   [facility]thopedic in [facility]   TOTAL KNEE ARTHROPLASTY Right 09/20/2015   TOTAL KNEE ARTHROPLASTY Right 09/20/2015   Procedure: TOTAL KNEE ARTHROPLASTY;  Surgeon: Salvatore Marvel, MD;  Location: [facility] OR;  Service: Orthopedics;  Laterality: Right;   Trigger Thumb Right 2015   Done in [facility] at the same time as the carpal tunnel release   TUBAL LIGATION  1980    Family Psychiatric History:  Mothe: anxiety Maternal aunt: alcohol use, anorexia   Family History:  Family History  Problem Relation Age of Onset   Heart disease Mother    Arthritis Mother    Heart failure Mother    Heart attack Mother    Anxiety  disorder Mother    Depression Mother    Hearing loss Mother    Varicose Veins Mother    Early death Father    Heart attack Father    Hearing loss Father    Heart disease Father    Hyperlipidemia Sister    Heart disease Sister    Hearing loss Sister    Arthritis Sister    Irregular heart beat Sister    Hypertension Sister    Hypertension Brother    Hyperlipidemia Brother    Heart attack Brother    Asthma Brother    Alcohol abuse Brother    Heart disease Brother    Early death Brother    Heart attack Brother    Depression Son    Alcohol abuse Maternal Aunt     Social History:  Social History   Socioeconomic History   Marital status: Married    Spouse name: Lyman Bishop   Number of children: 1   Years of education: 17.5   Highest education level: Master's degree (e.g., MA, MS, MEng, MEd, MSW, MBA)  Occupational History   Occupation: retired    Comment: Comptroller  Tobacco Use   Smoking status: Never   Smokeless tobacco: Never  Vaping Use   Vaping  Use: Never used  Substance and Sexual Activity   Alcohol use: Not Currently    Comment: 1 glass a month   Drug use: Yes    Types: Benzodiazepines   Sexual activity: Yes    Birth control/protection: Post-menopausal, None  Other Topics Concern   Not on file  Social History Narrative   Reads paper, walks, housework errands. Caffeine use daily.   3 grands   Social Determinants of Health   Financial Resource Strain: Low Risk  (05/11/2022)   Overall Financial Resource Strain (CARDIA)    Difficulty of Paying Living Expenses: Not hard at all  Food Insecurity: No Food Insecurity (05/11/2022)   Hunger Vital Sign    Worried About Running Out of Food in the Last Year: Never true    Ran Out of Food in the Last Year: Never true  Transportation Needs: No Transportation Needs (05/11/2022)   PRAPARE - Administrator, Civil Service (Medical): No    Lack of Transportation (Non-Medical): No  Physical Activity: Insufficiently  Active (05/11/2022)   Exercise Vital Sign    Days of Exercise per Week: 5 days    Minutes of Exercise per Session: 10 min  Stress: Stress Concern Present (05/11/2022)   [employer] of Occupational Health - Occupational Stress Questionnaire    Feeling of Stress : Rather much  Social Connections: Moderately Isolated (05/11/2022)   Social Connection and Isolation Panel [NHANES]    Frequency of Communication with Friends and Family: Three times a week    Frequency of Social Gatherings with Friends and Family: Twice a week    Attends Religious Services: Never    Database administrator or Organizations: No    Attends Engineer, structural: Not on file    Marital Status: Married    Allergies:  Allergies  Allergen Reactions   Penicillins Hives    Tolerates Cephalosporins    Prednisone Other (See Comments)    Hyperactivity, sleep disturbance, confusion   Sulfa Antibiotics Hives         Current Medications: Current Outpatient Medications  Medication Sig Dispense Refill   venlafaxine XR (EFFEXOR-XR) 150 MG 24 hr capsule Take 1 capsule (150 mg total) by mouth daily with breakfast. 90 capsule 3   amLODipine-benazepril (LOTREL) 5-20 MG capsule Take 1 capsule by mouth daily. 90 capsule 3   atorvastatin (LIPITOR) 20 MG tablet Take 1 tablet (20 mg total) by mouth daily. 90 tablet 3   CALCIUM PO Take 1,200 mg by mouth daily.     Cholecalciferol (D3 PO) Take 10,000 Units by mouth daily.     fluticasone (FLONASE) 50 MCG/ACT nasal spray Place 1 spray into both nostrils daily as needed for allergies or rhinitis.     lidocaine (XYLOCAINE) 2 % solution Use as directed 10 mLs in the mouth or throat every 6 (six) hours as needed for mouth pain. 100 mL 3   mirtazapine (REMERON) 30 MG tablet Take 1 tablet (30 mg total) by mouth at bedtime. 30 tablet 1   Multiple Vitamin (MULTIVITAMIN ADULT PO) Take 1 tablet by mouth daily.     raloxifene (EVISTA) 60 MG tablet Take 1 tablet (60 mg total) by  mouth daily. 90 tablet 3   triamterene-hydrochlorothiazide (DYAZIDE) 37.5-25 MG capsule TAKE ONE CAPSULE BY MOUTH ONE TIME A DAY 90 capsule 3   [START ON 06/15/2022] zolpidem (AMBIEN) 5 MG tablet Take 1.5 tablets (7.5 mg total) by mouth at bedtime as needed for sleep. 45 tablet 1  No current facility-administered medications for this visit.    ROS: Does not endorse any physical complaints  Objective:  Psychiatric Specialty Exam: There were no vitals taken for this visit.There is no height or weight on file to calculate BMI.  General Appearance: Casual and Well Groomed  Eye Contact:  Good  Speech:  Clear and Coherent and Normal Rate  Volume:  Normal  Mood:   "better if I was sleeping"  Affect:   Euthymic; mildly anxious  Thought Content:  Denies AVH; IOR; paranoia    Suicidal Thoughts:  No  Homicidal Thoughts:  No  Thought Process:  Goal Directed and Linear  Orientation:  Full (Time, Place, and Person)    Memory:   Grossly intact  Judgment:  Good  Insight:  Good  Concentration:  Concentration: Good  Recall:  NA  Fund of Knowledge: Good  Language: Good  Psychomotor Activity:  Normal  Akathisia:  No  AIMS (if indicated): not done  Assets:  Communication Skills Desire for Improvement Financial Resources/Insurance Housing Intimacy Leisure Time Physical Health Resilience Social Support Talents/Skills Transportation  ADL's:  Intact  Cognition: WNL  Sleep:   Chronically disrupted   PE: General: sits comfortably in view of camera; no acute distress  Pulm: no increased work of breathing on room air  MSK: all extremity movements appear intact  Neuro: no focal neurological deficits observed  Gait & Station: unable to assess by video    Metabolic Disorder Labs: Lab Results  Component Value Date   HGBA1C 6.0 04/17/2022   MPG 131 01/17/2019   MPG 128 11/28/2017   No results found for: "PROLACTIN" Lab Results  Component Value Date   CHOL 172 04/17/2022   TRIG 123.0  04/17/2022   HDL 69.80 04/17/2022   CHOLHDL 2 04/17/2022   VLDL 24.6 04/17/2022   LDLCALC 77 04/17/2022   LDLCALC 94 06/30/2021   Lab Results  Component Value Date   TSH 3.07 04/17/2022   TSH 4.01 01/08/2017    Therapeutic Level Labs: No results found for: "LITHIUM" No results found for: "VALPROATE" No results found for: "CBMZ"  Screenings:  GAD-7    Flowsheet Row Office Visit from 06/09/2022 in [facility] from 04/17/2022 in [facility] Video Visit from 03/17/2022 in [facility] Office Visit from 02/14/2022 in [facility] at [facility] Office Visit from 11/11/2021 in [facility] Primary Care & Sports Medicine at [facility]  Total GAD-7 Score 16 18 9 7 6       PHQ2-9    Flowsheet Row Office Visit from 06/09/2022 in [facility] Office Visit from 04/17/2022 in [facility] Video Visit from 03/17/2022 in [facility] Office Visit from 02/14/2022 in [facility] & [facility] Medicine at [facility] Office Visit from 11/11/2021 in [facility] Primary Care & Sports Medicine at [facility]  PHQ-2 Total Score 2 4 2 2 2   PHQ-9 Total Score 11 15 7 12 11       Flowsheet Row ED to Hosp-Admission (Discharged) from 02/05/2022 in [facility] from 12/25/2021 in [facility] Video Visit from 10/19/2021 in [facility]  C-SSRS RISK CATEGORY No Risk No Risk Error: Q7 should not be populated when Q6 is No       Collaboration of Care: Collaboration of Care: Medication Management AEB ongoing  medication management and Psychiatrist AEB established with this provider  Patient/Guardian was advised Release of Information must be obtained prior to any record release in order to  collaborate their care with an outside provider. Patient/Guardian was advised if they have not already done so to contact the registration department to sign all necessary forms in order for [place] to release information regarding their care.   Consent: Patient/Guardian gives verbal consent for treatment and assignment of benefits for services provided during this visit. Patient/Guardian expressed understanding and agreed to proceed.   Virtual Visit via Video Note  I connected with [name] on 06/13/22 at  3:00 PM EDT by a video enabled telemedicine application and verified that I am speaking with the correct person using two identifiers.  Location: Patient: home address in [place] Provider: remote office in [place]   I discussed the limitations of evaluation and management by telemedicine and the availability of in person appointments. The patient expressed understanding and agreed to proceed.  I discussed the assessment and treatment plan with the patient. The patient was provided an opportunity to ask questions and all were answered. The patient agreed with the plan and demonstrated an understanding of the instructions.   The patient was advised to call back or seek an in-person evaluation if the symptoms worsen or if the condition fails to improve as anticipated.  I provided 30 minutes of non-face-to-face time during this encounter.  Mattilyn Crites A  06/13/2022, 3:56 PM

## 2022-06-13 ENCOUNTER — Encounter (HOSPITAL_COMMUNITY): Payer: Self-pay | Admitting: Psychiatry

## 2022-06-13 ENCOUNTER — Telehealth (INDEPENDENT_AMBULATORY_CARE_PROVIDER_SITE_OTHER): Payer: Medicare Other | Admitting: Psychiatry

## 2022-06-13 DIAGNOSIS — F411 Generalized anxiety disorder: Secondary | ICD-10-CM

## 2022-06-13 DIAGNOSIS — F331 Major depressive disorder, recurrent, moderate: Secondary | ICD-10-CM

## 2022-06-13 DIAGNOSIS — F5101 Primary insomnia: Secondary | ICD-10-CM

## 2022-06-13 MED ORDER — ZOLPIDEM TARTRATE 5 MG PO TABS
7.5000 mg | ORAL_TABLET | Freq: Every evening | ORAL | 1 refills | Status: DC | PRN
Start: 2022-06-15 — End: 2022-07-18

## 2022-06-13 MED ORDER — MIRTAZAPINE 30 MG PO TABS: 30.0000 mg | ORAL_TABLET | Freq: Every day | ORAL | 1 refills | Status: DC

## 2022-06-13 NOTE — Patient Instructions (Addendum)
Thank you for attending your appointment today.  -- INCREASE mirtazapine to 30 mg nightly -- Continue other medications as prescribed. -- As discussed, reach back out to the center you found for CBT-i. If that is no longer available, here is a Chief Technology Officer for local therapists that offer CBT for insomnia (CBT-i): -  [email or web address].com/us/[email or web address]-[email or web address]-insomnia  -  CBT-i coach is an app you can download as well.  Please do not make any changes to medications without first discussing with your provider. If you are experiencing a psychiatric emergency, please call 911 or present to your nearest emergency department. Additional crisis, medication management, and therapy resources are included below.  [facility]  [address] [place], [place] [address] [phone] WALK-IN URGENT CARE 24/7 FOR ANYONE [address], [place], [place]  [phone] Fax: [phone] [email or web address] *Interpreters available *Accepts all insurance and uninsured for Urgent Care needs *Accepts Medicaid and uninsured for outpatient treatment (below)      ONLY FOR [facility]  Below:    Outpatient New Patient Assessment/Therapy Walk-ins:        Monday -Thursday 8am until slots are full.        Every Friday 1pm-4pm  (first come, first served)                   New Patient Psychiatry/Medication Management        Monday-Friday 8am-11am (first come, first served)               For all walk-ins we ask that you arrive by 7:15am, because patients will be seen in the order of arrival.

## 2022-06-14 ENCOUNTER — Other Ambulatory Visit: Payer: Self-pay | Admitting: Medical-Surgical

## 2022-06-15 ENCOUNTER — Telehealth (HOSPITAL_COMMUNITY): Payer: Medicare Other | Admitting: Psychiatry

## 2022-06-23 DIAGNOSIS — F339 Major depressive disorder, recurrent, unspecified: Secondary | ICD-10-CM | POA: Diagnosis not present

## 2022-06-23 DIAGNOSIS — F4010 Social phobia, unspecified: Secondary | ICD-10-CM | POA: Diagnosis not present

## 2022-07-04 DIAGNOSIS — M1990 Unspecified osteoarthritis, unspecified site: Secondary | ICD-10-CM | POA: Insufficient documentation

## 2022-07-04 DIAGNOSIS — M79642 Pain in left hand: Secondary | ICD-10-CM | POA: Diagnosis not present

## 2022-07-04 DIAGNOSIS — G5602 Carpal tunnel syndrome, left upper limb: Secondary | ICD-10-CM | POA: Diagnosis not present

## 2022-07-04 DIAGNOSIS — F32A Depression, unspecified: Secondary | ICD-10-CM | POA: Insufficient documentation

## 2022-07-04 DIAGNOSIS — M180 Bilateral primary osteoarthritis of first carpometacarpal joints: Secondary | ICD-10-CM | POA: Diagnosis not present

## 2022-07-04 DIAGNOSIS — F419 Anxiety disorder, unspecified: Secondary | ICD-10-CM | POA: Insufficient documentation

## 2022-07-04 DIAGNOSIS — M79641 Pain in right hand: Secondary | ICD-10-CM | POA: Diagnosis not present

## 2022-07-11 DIAGNOSIS — F4010 Social phobia, unspecified: Secondary | ICD-10-CM | POA: Diagnosis not present

## 2022-07-11 DIAGNOSIS — F339 Major depressive disorder, recurrent, unspecified: Secondary | ICD-10-CM | POA: Diagnosis not present

## 2022-07-17 NOTE — Progress Notes (Unsigned)
BH MD Outpatient Progress Note  07/18/2022 11:24 AM [name]  MRN:  [id number]  Assessment:  [name] presents for follow-up evaluation. Today, 07/18/22, patient reports slight improvement in sleep this interval although does not attribute this to increase in Remeron and identifies distress related to appetite increase and weight gain associated with increase. Patient expresses desire to decrease dose/consider discontinuation; outlined instructions to taper medication every 2 weeks to determine if lower dosing may mitigate appetite increase while still promoting sleep. Patient to also consider trial of PRN Unisom. Fortunately, she has recently established with CBT-i therapist and is hopeful about impact this will have on sleep and nighttime anxiety. Patient denies signs/sx of depression or significant daytime anxiety at this time.  RTC in 8 weeks by video.  Identifying Information: [name] is a 77 y.o. female with a history of MDD, anxiety, arthritis, HTN, and prediabetes who is an established patient with [facility] participating in follow-up via video conferencing.   Plan:  # GAD  MDD in remission Past medication trials: Remeron (nausea, diarrhea, oversedation); Klonopin Status of problem: acute exacerbation Interventions: -- Continue Effexor 150 mg daily -- DECREASE Remeron from 30 mg to 15 mg nightly given increased appetite and weight gain at higher dose; instructed that if appetite increase persists can decrease to 7.5 mg nightly and then stop  # Insomnia Past medication trials: Restoril, Klonopin, Remeron (nausea, diarrhea, oversedation), doxepin (ineffective), trazodone (nausea, diarrhea, dizziness), Atarax ("jumpy"), gabapentin ("dopey") Status of problem: chronic Interventions: -- Remeron as above -- Patient to trial doxylamine 12.5-25 mg nightly PRN sleep -- Risks, benefits, and side effects including but not limited to  dizziness, sedation, overactivation were reviewed with informed consent provided -- Continue Ambien 7.5 mg nightly   -- Risks, benefits, and side effects including but not limited to drowsiness and daytime sedation, dizziness, decreased mental alertness, falls and related injuries, increased risk for dementia was discussed   -- Have discussed plan for gradual taper and discontinuation of this medication over time given lack of indication for long term use -- Patient has established with CBT-i therapist Emma Salisbury LCSW at [facility] -- Sleep hygiene reviewed -- Sleep study 05/10/20: mildest OSA; CPAP not recommended; could consider dental device  Patient was given contact information for behavioral health clinic and was instructed to call 911 for emergencies.   Subjective:  Chief Complaint:  Chief Complaint  Patient presents with   Medication Management    Interval History:   Reports increased appetite with higher dose of Remeron ("appetite seemed to double when I went up"). Feels she has gained weight although not sure how much. Worried about weight gain due to arthritis. Initially felt "dopey" - this has been better although still feels "dull." Does not feel higher dose has been helpful for sleep although reports that overall she has been sleeping a little better with 5-5.5 hours nightly; continues to feel panicky if not falling asleep. Denies much anxiety or panic during the day. Feels she is able to manage daytime anxiety for the most part. Reports mood has been "better" and denies depressive symptoms. Denies SI or HI.   Reports she has started CBT-I about a month ago and has found it helpful thus far to better understand sleep-related issues. Meeting every 2 weeks.   Continues to take Ambien; feels it is somewhat helpful. Expresses desire to come off Remeron; discussed incremental taper to determine if lower dose may curb appetite while promoting sleep/targeting  anxiety. She  denies past trial of Unisom and was amenable to obtaining OTC however understands she does not have to use if she experiences adverse effects (reports excitability with antihistamines in the past).   PDMP: -- Zolpidem 5 mg QTY 45 last filled 07/14/22 (regular rx dating back to Jan 2023)  Visit Diagnosis:    ICD-10-CM   1. Generalized anxiety disorder  F41.1     2. Primary insomnia  F51.01 zolpidem (AMBIEN) 5 MG tablet    3. MDD (recurrent major depressive disorder) in remission (HCC)  F33.40      Past Psychiatric History:  Diagnoses: MDD with anxiety Medication trials: Restoril, Remeron (nausea, diarrhea, oversedation), doxepin (ineffective), trazodone (nausea, diarrhea, dizziness), melatonin, Atarax ("jumpy"), gabapentin ("dopey") Suicide attempts: denies Substance use: denies tobacco or illicit drug use; etoh: 1 glass/month  Past Medical History:  Past Medical History:  Diagnosis Date   Allergy 1980's   Sulfa drugs and penecillin   Anxiety    Cataract 2021   Corrected   Depression    Headache    SINUS   Hyperlipidemia    Hypertension    Osteopenia    Osteopenia    Primary localized osteoarthritis of right knee     Past Surgical History:  Procedure Laterality Date   BREAST EXCISIONAL BIOPSY Right    benign cyst removal    CARPAL TUNNEL RELEASE Right 2015   Ortho in [place]   CHOLECYSTECTOMY N/A 02/08/2022   Procedure: LAPAROSCOPIC CHOLECYSTECTOMY WITH INTRAOPERATIVE CHOLANGIOGRAM;  Surgeon: Romie Levee, MD;  Location: [facility];  Service: General;  Laterality: N/A;   EYE SURGERY  12/2019   cataracts   JOINT REPLACEMENT  09/2015   Right knee   LASIK     MENISCUS REPAIR Right 2011   [facility]opedic in [facility]   TOTAL KNEE ARTHROPLASTY Right 09/20/2015   TOTAL KNEE ARTHROPLASTY Right 09/20/2015   Procedure: TOTAL KNEE ARTHROPLASTY;  Surgeon: Salvatore Marvel, MD;  Location: [facility] OR;  Service: Orthopedics;  Laterality: Right;   Trigger Thumb Right 2015   Done in [facility] at  the same time as the carpal tunnel release   TUBAL LIGATION  1980    Family Psychiatric History:  Mothe: anxiety Maternal aunt: alcohol use, anorexia   Family History:  Family History  Problem Relation Age of Onset   Heart disease Mother    Arthritis Mother    Heart failure Mother    Heart attack Mother    Anxiety disorder Mother    Depression Mother    Hearing loss Mother    Varicose Veins Mother    Early death Father    Heart attack Father    Hearing loss Father    Heart disease Father    Hyperlipidemia Sister    Heart disease Sister    Hearing loss Sister    Arthritis Sister    Irregular heart beat Sister    Hypertension Sister    Hypertension Brother    Hyperlipidemia Brother    Heart attack Brother    Asthma Brother    Alcohol abuse Brother    Heart disease Brother    Early death Brother    Heart attack Brother    Depression Son    Alcohol abuse Maternal Aunt     Social History:  Social History   Socioeconomic History   Marital status: Married    Spouse name: Lyman Bishop   Number of children: 1   Years of education: 17.5   Highest education level: Master's degree (e.g.,  MA, MS, MEng, MEd, MSW, MBA)  Occupational History   Occupation: retired    Comment: Comptroller  Tobacco Use   Smoking status: Never   Smokeless tobacco: Never  Vaping Use   Vaping Use: Never used  Substance and Sexual Activity   Alcohol use: Not Currently    Comment: 1 glass a month   Drug use: Yes    Types: Benzodiazepines   Sexual activity: Yes    Birth control/protection: Post-menopausal, None  Other Topics Concern   Not on file  Social History Narrative   Reads paper, walks, housework errands. Caffeine use daily.   3 grands   Social Determinants of Health   Financial Resource Strain: Low Risk  (05/11/2022)   Overall Financial Resource Strain (CARDIA)    Difficulty of Paying Living Expenses: Not hard at all  Food Insecurity: No Food Insecurity (05/11/2022)   Hunger Vital  Sign    Worried About Running Out of Food in the Last Year: Never true    Ran Out of Food in the Last Year: Never true  Transportation Needs: No Transportation Needs (05/11/2022)   PRAPARE - Administrator, Civil Service (Medical): No    Lack of Transportation (Non-Medical): No  Physical Activity: Insufficiently Active (05/11/2022)   Exercise Vital Sign    Days of Exercise per Week: 5 days    Minutes of Exercise per Session: 10 min  Stress: Stress Concern Present (05/11/2022)   [employer] of Occupational Health - Occupational Stress Questionnaire    Feeling of Stress : Rather much  Social Connections: Moderately Isolated (05/11/2022)   Social Connection and Isolation Panel [NHANES]    Frequency of Communication with Friends and Family: Three times a week    Frequency of Social Gatherings with Friends and Family: Twice a week    Attends Religious Services: Never    Database administrator or Organizations: No    Attends Engineer, structural: Not on file    Marital Status: Married    Allergies:  Allergies  Allergen Reactions   Penicillins Hives    Tolerates Cephalosporins    Prednisone Other (See Comments)    Hyperactivity, sleep disturbance, confusion   Sulfa Antibiotics Hives         Current Medications: Current Outpatient Medications  Medication Sig Dispense Refill   diclofenac Sodium (VOLTAREN) 1 % GEL Apply 1 Application topically 2 (two) times daily.     doxylamine, Sleep, (UNISOM) 25 MG tablet Take 1 tablet (25 mg total) by mouth at bedtime as needed for sleep. 30 tablet 0   mirtazapine (REMERON) 30 MG tablet Take 1 tablet (30 mg total) by mouth at bedtime. 30 tablet 1   venlafaxine XR (EFFEXOR-XR) 150 MG 24 hr capsule Take 1 capsule (150 mg total) by mouth daily with breakfast. 90 capsule 3   amLODipine-benazepril (LOTREL) 5-20 MG capsule Take 1 capsule by mouth daily. 90 capsule 3   atorvastatin (LIPITOR) 20 MG tablet Take 1 tablet (20 mg  total) by mouth daily. 90 tablet 3   CALCIUM PO Take 1,200 mg by mouth daily.     Cholecalciferol (D3 PO) Take 10,000 Units by mouth daily.     fluticasone (FLONASE) 50 MCG/ACT nasal spray Place 1 spray into both nostrils daily as needed for allergies or rhinitis.     lidocaine (XYLOCAINE) 2 % solution Use as directed 10 mLs in the mouth or throat every 6 (six) hours as needed for mouth pain. 100 mL 3  Multiple Vitamin (MULTIVITAMIN ADULT PO) Take 1 tablet by mouth daily.     raloxifene (EVISTA) 60 MG tablet Take 1 tablet (60 mg total) by mouth daily. 90 tablet 3   triamterene-hydrochlorothiazide (DYAZIDE) 37.5-25 MG capsule TAKE ONE CAPSULE BY MOUTH ONE TIME A DAY 90 capsule 3   [START ON 08/13/2022] zolpidem (AMBIEN) 5 MG tablet Take 1.5 tablets (7.5 mg total) by mouth at bedtime as needed for sleep. 45 tablet 1   No current facility-administered medications for this visit.    ROS: Endorses increased appetite and weight gain  Objective:  Psychiatric Specialty Exam: Blood pressure (!) 173/67, pulse 99, resp. rate 18, height 5\' 2"  (1.575 m), weight 186 lb 6.4 oz (84.6 kg), SpO2 99 %.Body mass index is 34.09 kg/m.  General Appearance: Casual, Neat, and Well Groomed  Eye Contact:  Good  Speech:  Clear and Coherent and Normal Rate  Volume:  Normal  Mood:   "better "  Affect:   Euthymic; mildly anxious  Thought Content:  Denies AVH; IOR; paranoia    Suicidal Thoughts:  No  Homicidal Thoughts:  No  Thought Process:  Goal Directed and Linear  Orientation:  Full (Time, Place, and Person)    Memory:   Grossly intact  Judgment:  Good  Insight:  Good  Concentration:  Concentration: Good  Recall:  NA  Fund of Knowledge: Good  Language: Good  Psychomotor Activity:  Normal  Akathisia:  No  AIMS (if indicated): not done  Assets:  Communication Skills Desire for Improvement Financial Resources/Insurance Housing Intimacy Leisure Time Physical Health Resilience Social  Support Talents/Skills Transportation  ADL's:  Intact  Cognition: WNL  Sleep:   Chronically disrupted   PE: General: well-appearing; no acute distress  Pulm: no increased work of breathing on room air  Strength & Muscle Tone: within normal limits Neuro: no focal neurological deficits observed  Gait & Station: normal  Metabolic Disorder Labs: Lab Results  Component Value Date   HGBA1C 6.0 04/17/2022   MPG 131 01/17/2019   MPG 128 11/28/2017   No results found for: "PROLACTIN" Lab Results  Component Value Date   CHOL 172 04/17/2022   TRIG 123.0 04/17/2022   HDL 69.80 04/17/2022   CHOLHDL 2 04/17/2022   VLDL 24.6 04/17/2022   LDLCALC 77 04/17/2022   LDLCALC 94 06/30/2021   Lab Results  Component Value Date   TSH 3.07 04/17/2022   TSH 4.01 01/08/2017    Therapeutic Level Labs: No results found for: "LITHIUM" No results found for: "VALPROATE" No results found for: "CBMZ"  Screenings:  GAD-7    Flowsheet Row Office Visit from 06/09/2022 in [facility] from 04/17/2022 in [facility] Video Visit from 03/17/2022 in [facility] Office Visit from 02/14/2022 in [facility] at [facility] Office Visit from 11/11/2021 in [facility] Primary Care & [facility] Medicine at [facility]  Total GAD-7 Score 16 18 9 7 6       PHQ2-9    Flowsheet Row Office Visit from 06/09/2022 in [facility] Office Visit from 04/17/2022 in [facility] Video Visit from 03/17/2022 in [facility] Office Visit from 02/14/2022 in [facility] & [facility] Medicine at [facility] Office Visit from 11/11/2021 in [facility] Total Score 2 4 2 2 2   PHQ-9 Total Score 11 15 7  12 11      Flowsheet Row ED to Hosp-Admission  (Discharged) from [facility]05/2022 [facility] from 12/25/2021 in [facility] Video Visit from 10/19/2021 in [facility]  C-SSRS RISK CATEGORY No Risk No Risk Error: Q7 should not be populated when Q6 is No       Collaboration of Care: Collaboration of Care: Medication Management AEB ongoing medication management and Psychiatrist AEB established with this provider  Patient/Guardian was advised Release of Information must be obtained prior to any record release in order to collaborate their care with an outside provider. Patient/Guardian was advised if they have not already done so to contact the registration department to sign all necessary forms in order for [place] to release information regarding their care.   Consent: Patient/Guardian gives verbal consent for treatment and assignment of benefits for services provided during this visit. Patient/Guardian expressed understanding and agreed to proceed.   A total of 35 minutes was spent involved in face to face clinical care, chart review, documentation, and medication management.   Eldine Rencher A  07/18/2022, 11:24 AM

## 2022-07-18 ENCOUNTER — Ambulatory Visit (INDEPENDENT_AMBULATORY_CARE_PROVIDER_SITE_OTHER): Payer: Medicare Other | Admitting: Psychiatry

## 2022-07-18 ENCOUNTER — Encounter (HOSPITAL_COMMUNITY): Payer: Self-pay | Admitting: Psychiatry

## 2022-07-18 VITALS — BP 173/67 | HR 99 | Resp 18 | Ht 62.0 in | Wt 186.4 lb

## 2022-07-18 DIAGNOSIS — F3340 Major depressive disorder, recurrent, in remission, unspecified: Secondary | ICD-10-CM | POA: Diagnosis not present

## 2022-07-18 DIAGNOSIS — F411 Generalized anxiety disorder: Secondary | ICD-10-CM | POA: Diagnosis not present

## 2022-07-18 DIAGNOSIS — F5101 Primary insomnia: Secondary | ICD-10-CM | POA: Diagnosis not present

## 2022-07-18 MED ORDER — DOXYLAMINE SUCCINATE (SLEEP) 25 MG PO TABS: 12.5000 mg | ORAL_TABLET | Freq: Every evening | ORAL | 0 refills | Status: DC | PRN

## 2022-07-18 MED ORDER — ZOLPIDEM TARTRATE 5 MG PO TABS
7.5000 mg | ORAL_TABLET | Freq: Every evening | ORAL | 1 refills | Status: DC | PRN
Start: 2022-08-13 — End: 2022-09-22

## 2022-07-18 MED ORDER — DOXYLAMINE SUCCINATE (SLEEP) 25 MG PO TABS: 25.0000 mg | ORAL_TABLET | Freq: Every evening | ORAL | 0 refills | Status: DC | PRN

## 2022-07-18 NOTE — Patient Instructions (Signed)
Thank you for attending your appointment today.  -- DECREASE Remeron to 15 mg nightly. If you continue to experience increased appetite on this dose after 2 weeks, you may decrease to 7.5 mg nightly for an additional 2 weeks then stop if appetite remains an issue. -- Continue other medications as prescribed.  Please do not make any changes to medications without first discussing with your provider. If you are experiencing a psychiatric emergency, please call 911 or present to your nearest emergency department. Additional crisis, medication management, and therapy resources are included below.  [facility]  [address] [place], [place] [address] [phone] WALK-IN URGENT CARE 24/7 FOR ANYONE [address] [place], [place]  [phone] Fax: [phone] [email or web address] *Interpreters available *Accepts all insurance and uninsured for Urgent Care needs *Accepts Medicaid and uninsured for outpatient treatment (below)      ONLY FOR [facility]  Below:    Outpatient New Patient Assessment/Therapy Walk-ins:        Monday -Thursday 8am until slots are full.        Every Friday 1pm-4pm  (first come, first served)                   New Patient Psychiatry/Medication Management        Monday-Friday 8am-11am (first come, first served)               For all walk-ins we ask that you arrive by 7:15am, because patients will be seen in the order of arrival.

## 2022-07-20 ENCOUNTER — Ambulatory Visit: Payer: Medicare Other | Admitting: Medical-Surgical

## 2022-07-25 DIAGNOSIS — M180 Bilateral primary osteoarthritis of first carpometacarpal joints: Secondary | ICD-10-CM | POA: Insufficient documentation

## 2022-07-25 DIAGNOSIS — M79641 Pain in right hand: Secondary | ICD-10-CM | POA: Diagnosis not present

## 2022-07-25 DIAGNOSIS — G5602 Carpal tunnel syndrome, left upper limb: Secondary | ICD-10-CM | POA: Insufficient documentation

## 2022-07-25 DIAGNOSIS — M1812 Unilateral primary osteoarthritis of first carpometacarpal joint, left hand: Secondary | ICD-10-CM | POA: Diagnosis not present

## 2022-07-26 DIAGNOSIS — F4010 Social phobia, unspecified: Secondary | ICD-10-CM | POA: Diagnosis not present

## 2022-07-26 DIAGNOSIS — F339 Major depressive disorder, recurrent, unspecified: Secondary | ICD-10-CM | POA: Diagnosis not present

## 2022-08-08 ENCOUNTER — Ambulatory Visit (INDEPENDENT_AMBULATORY_CARE_PROVIDER_SITE_OTHER): Payer: Medicare Other

## 2022-08-08 VITALS — BP 140/70 | HR 96 | Temp 98.0°F | Wt 188.8 lb

## 2022-08-08 DIAGNOSIS — Z0000 Encounter for general adult medical examination without abnormal findings: Secondary | ICD-10-CM | POA: Diagnosis not present

## 2022-08-08 NOTE — Progress Notes (Signed)
Subjective:   [name] is a 77 y.o. female who presents for Medicare Annual (Subsequent) preventive examination.  Visit Complete: In person  Patient Medicare AWV questionnaire was completed by the patient on 08/04/22; I have confirmed that all information answered by patient is correct and no changes since this date.  Review of Systems     Cardiac Risk Factors include: advanced age (>69men, >62 women);dyslipidemia;hypertension;obesity (BMI >30kg/m2)     Objective:    Today's Vitals   08/08/22 1546 08/08/22 1548  BP: (!) 150/70 (!) 140/70  Pulse: 96   Temp: 98 F (36.7 C)   SpO2: 97%   Weight: 188 lb 12.8 oz (85.6 kg)    Body mass index is 34.53 kg/m.     08/08/2022    3:51 PM 02/08/2022   11:22 AM 02/05/2022    3:30 PM 02/27/2020    2:14 PM 09/23/2019   11:12 AM 05/20/2019   10:01 AM 02/18/2018   11:24 AM  Advanced Directives  Does Patient Have a Medical Advance Directive? Yes Yes Yes Yes No;Yes Yes Yes  Type of Estate agent of [place];Living will Healthcare Power of [employer] Power of [employer] Power of [place];Living will Healthcare Power of [place];Living will Healthcare Power of [place];Living will Healthcare Power of [place];Out of facility DNR (pink MOST or yellow form);Living will  Does patient want to make changes to medical advance directive? No - Patient declined No - Patient declined No - Patient declined No - Patient declined   No - Patient declined  Copy of Healthcare Power of Attorney in Chart? Yes - validated most recent copy scanned in chart (See row information) No - copy requested No - copy requested Yes - validated most recent copy scanned in chart (See row information) Yes - validated most recent copy scanned in chart (See row information) Yes - validated most recent copy scanned in chart (See row information) No - copy requested  Would patient like information on creating a medical advance directive?     No - Patient declined       Current Medications (verified) Outpatient Encounter Medications as of 08/08/2022  Medication Sig   amLODipine-benazepril (LOTREL) 5-20 MG capsule Take 1 capsule by mouth daily.   atorvastatin (LIPITOR) 20 MG tablet Take 1 tablet (20 mg total) by mouth daily.   CALCIUM PO Take 1,200 mg by mouth daily.   Cholecalciferol (D3 PO) Take 10,000 Units by mouth daily.   diclofenac Sodium (VOLTAREN) 1 % GEL Apply 1 Application topically 2 (two) times daily.   fluticasone (FLONASE) 50 MCG/ACT nasal spray Place 1 spray into both nostrils daily as needed for allergies or rhinitis.   lidocaine (XYLOCAINE) 2 % solution Use as directed 10 mLs in the mouth or throat every 6 (six) hours as needed for mouth pain.   mirtazapine (REMERON) 30 MG tablet Take 1 tablet (30 mg total) by mouth at bedtime.   Multiple Vitamin (MULTIVITAMIN ADULT PO) Take 1 tablet by mouth daily.   raloxifene (EVISTA) 60 MG tablet Take 1 tablet (60 mg total) by mouth daily.   triamterene-hydrochlorothiazide (DYAZIDE) 37.5-25 MG capsule TAKE ONE CAPSULE BY MOUTH ONE TIME A DAY   venlafaxine XR (EFFEXOR-XR) 150 MG 24 hr capsule Take 1 capsule (150 mg total) by mouth daily with breakfast.   [START ON 08/13/2022] zolpidem (AMBIEN) 5 MG tablet Take 1.5 tablets (7.5 mg total) by mouth at bedtime as needed for sleep.   [DISCONTINUED] doxylamine, Sleep, (UNISOM) 25 MG  tablet Take 0.5-1 tablets (12.5-25 mg total) by mouth at bedtime as needed for sleep.   No facility-administered encounter medications on file as of 08/08/2022.    Allergies (verified) Penicillins, Prednisone, and Sulfa antibiotics   History: Past Medical History:  Diagnosis Date   Allergy 1980's   Sulfa drugs and penecillin   Anxiety    Cataract 2021   Corrected   Depression    Headache    SINUS   Hyperlipidemia    Hypertension    Osteopenia    Osteopenia    Primary localized osteoarthritis of right knee    Past Surgical History:   Procedure Laterality Date   BREAST EXCISIONAL BIOPSY Right    benign cyst removal    CARPAL TUNNEL RELEASE Right 2015   Ortho in [place]   CHOLECYSTECTOMY N/A 02/08/2022   Procedure: LAPAROSCOPIC CHOLECYSTECTOMY WITH INTRAOPERATIVE CHOLANGIOGRAM;  Surgeon: Romie Levee, MD;  Location: [facility];  Service: General;  Laterality: N/A;   EYE SURGERY  12/2019   cataracts   JOINT REPLACEMENT  09/2015   Right knee   LASIK     MENISCUS REPAIR Right 2011   [facility]thopedic in [facility]   TOTAL KNEE ARTHROPLASTY Right 09/20/2015   TOTAL KNEE ARTHROPLASTY Right 09/20/2015   Procedure: TOTAL KNEE ARTHROPLASTY;  Surgeon: Salvatore Marvel, MD;  Location: [facility] OR;  Service: Orthopedics;  Laterality: Right;   Trigger Thumb Right 2015   Done in [place] at the same time as the carpal tunnel release   TUBAL LIGATION  1980   Family History  Problem Relation Age of Onset   Heart disease Mother    Arthritis Mother    Heart failure Mother    Heart attack Mother    Anxiety disorder Mother    Depression Mother    Hearing loss Mother    Varicose Veins Mother    Early death Father    Heart attack Father    Hearing loss Father    Heart disease Father    Hyperlipidemia Sister    Heart disease Sister    Hearing loss Sister    Arthritis Sister    Irregular heart beat Sister    Hypertension Sister    Hypertension Brother    Hyperlipidemia Brother    Heart attack Brother    Asthma Brother    Alcohol abuse Brother    Heart disease Brother    Early death Brother    Heart attack Brother    Depression Son    Alcohol abuse Maternal Aunt    Social History   Socioeconomic History   Marital status: Married    Spouse name: Lyman Bishop   Number of children: 1   Years of education: 17.5   Highest education level: Master's degree (e.g., MA, MS, MEng, MEd, MSW, MBA)  Occupational History   Occupation: retired    Comment: Comptroller  Tobacco Use   Smoking status: Never   Smokeless tobacco: Never  Vaping Use    Vaping Use: Never used  Substance and Sexual Activity   Alcohol use: Not Currently    Comment: 1 glass a month   Drug use: Yes    Types: Benzodiazepines   Sexual activity: Yes    Birth control/protection: Post-menopausal, None  Other Topics Concern   Not on file  Social History Narrative   Reads paper, walks, housework errands. Caffeine use daily.   3 grands   Social Determinants of Health   Financial Resource Strain: Low Risk  (08/04/2022)   Overall Financial Resource  Strain (CARDIA)    Difficulty of Paying Living Expenses: Not hard at all  Food Insecurity: No Food Insecurity (08/04/2022)   Hunger Vital Sign    Worried About Running Out of Food in the Last Year: Never true    Ran Out of Food in the Last Year: Never true  Transportation Needs: No Transportation Needs (08/04/2022)   PRAPARE - Administrator, Civil Service (Medical): No    Lack of Transportation (Non-Medical): No  Physical Activity: Insufficiently Active (08/04/2022)   Exercise Vital Sign    Days of Exercise per Week: 1 day    Minutes of Exercise per Session: 20 min  Stress: Stress Concern Present (08/04/2022)   [employer] of Occupational Health - Occupational Stress Questionnaire    Feeling of Stress : Rather much  Social Connections: Moderately Integrated (08/04/2022)   Social Connection and Isolation Panel [NHANES]    Frequency of Communication with Friends and Family: Twice a week    Frequency of Social Gatherings with Friends and Family: Once a week    Attends Religious Services: Never    Database administrator or Organizations: Yes    Attends Engineer, structural: More than 4 times per year    Marital Status: Married  Recent Concern: Social Connections - Moderately Isolated (05/11/2022)   Social Connection and Isolation Panel [NHANES]    Frequency of Communication with Friends and Family: Three times a week    Frequency of Social Gatherings with Friends and Family: Twice a week     Attends Religious Services: Never    Database administrator or Organizations: No    Attends Engineer, structural: Not on file    Marital Status: Married    Tobacco Counseling Counseling given: Not Answered   Clinical Intake:  Pre-visit preparation completed: Yes  Pain : No/denies pain     BMI - recorded: 34.53 Nutritional Status: BMI > 30  Obese Diabetes: No  How often do you need to have someone help you when you read instructions, pamphlets, or other written materials from your doctor or pharmacy?: 1 - Never  Interpreter Needed?: No  Information entered by :: Lanier Ensign, LPN   Activities of Daily Living    08/04/2022   11:32 AM 02/05/2022    3:30 PM  In your present state of health, do you have any difficulty performing the following activities:  Hearing? 1 1  Vision? 0 0  Difficulty concentrating or making decisions? 0 0  Walking or climbing stairs? 1 0  Comment balance   Dressing or bathing? 0 0  Doing errands, shopping? 0 0  Preparing Food and eating ? N   Using the Toilet? N   In the past six months, have you accidently leaked urine? N   Do you have problems with loss of bowel control? N   Managing your Medications? N   Managing your Finances? N   Housekeeping or managing your Housekeeping? N     Patient Care Team: Jeani Sow, MD as PCP - General (Family Medicine)  Indicate any recent Medical Services you may have received from other than [facility] providers in the past year (date may be approximate).     Assessment:   This is a routine wellness examination for Meila.  Hearing/Vision screen Hearing Screening - Comments:: Pt wears hearing aids  Vision Screening - Comments:: Pt [facility] up [facility] for annual eye exams   Dietary issues and exercise activities discussed:  Goals Addressed             This Visit's Progress    Patient Stated       Get back to 150 lbs and sleep better        Depression Screen     08/08/2022    3:45 PM 06/09/2022    1:59 PM 04/17/2022   11:19 AM 03/17/2022    8:20 AM 02/14/2022    2:21 PM 11/11/2021    2:00 PM 10/19/2021    9:39 AM  PHQ 2/9 Scores  PHQ - 2 Score 2 2 4  2 2    PHQ- 9 Score 6 11 15  12 11       Information is confidential and restricted. Go to Review Flowsheets to unlock data.    Fall Risk    08/04/2022   11:32 AM 06/09/2022    1:58 PM 04/17/2022   11:19 AM 02/14/2022    2:21 PM 10/19/2021   12:37 PM  Fall Risk   Falls in the past year? 1 1 1  0 0  Number falls in past yr: 0 1 1 0 0  Injury with Fall? 1 0 1 0 0  Comment head injury      Risk for fall due to : History of fall(s);Impaired balance/gait;Impaired vision History of fall(s);Impaired balance/gait Impaired balance/gait No Fall Risks   Follow up Falls prevention discussed Falls prevention discussed Falls prevention discussed;Education provided Falls evaluation completed     MEDICARE RISK AT HOME:   TIMED UP AND GO:  Was the test performed?  Yes  Length of time to ambulate 10 feet: 10 sec Gait steady and fast without use of assistive device    Cognitive Function:        08/08/2022    3:53 PM 02/27/2020    2:16 PM 02/18/2018   11:33 AM  6CIT Screen  What Year? 0 points 0 points 0 points  What month? 0 points 0 points 0 points  What time? 0 points 0 points 0 points  Count back from 20 0 points 0 points 2 points  Months in reverse 0 points 0 points 0 points  Repeat phrase 0 points 0 points 0 points  Total Score 0 points 0 points 2 points    Immunizations Immunization History  Administered Date(s) Administered   Fluad Quad(high Dose 65+) 10/30/2018   Influenza, High Dose Seasonal PF 12/14/2016, 11/28/2017, 10/08/2020   Influenza,inj,Quad PF,6+ Mos 12/07/2015   Influenza-Unspecified 11/14/2019, 12/18/2021   Moderna Covid-19 Vaccine Bivalent Booster 74yrs & up 08/30/2021   Moderna Sars-Covid-2 Vaccination 03/15/2019, 04/14/2019, 12/24/2019, 05/28/2020, 10/25/2020   Pneumococcal  Conjugate-13 07/01/2014   Pneumococcal Polysaccharide-23 07/07/2010   Tdap 08/18/2008, 12/05/2011   Zoster Recombinat (Shingrix) 09/22/2017, 03/11/2018   Zoster, Live 08/22/2010    TDAP status: Due, Education has been provided regarding the importance of this vaccine. Advised may receive this vaccine at local pharmacy or Health Dept. Aware to provide a copy of the vaccination record if obtained from local pharmacy or Health Dept. Verbalized acceptance and understanding.  Flu Vaccine status: Up to date  Pneumococcal vaccine status: Up to date  Covid-19 vaccine status: Completed vaccines  Qualifies for Shingles Vaccine? Yes   Zostavax completed Yes   Shingrix Completed?: Yes  Screening Tests Health Maintenance  Topic Date Due   COVID-19 Vaccine (7 - 2023-24 season) 10/25/2021   DTaP/Tdap/Td (3 - Td or Tdap) 12/04/2021   INFLUENZA VACCINE  09/14/2022   Medicare Annual Wellness (AWV)  08/08/2023  Pneumonia Vaccine 89+ Years old  Completed   DEXA SCAN  Completed   Hepatitis C Screening  Completed   Zoster Vaccines- Shingrix  Completed   HPV VACCINES  Aged Out   Fecal DNA (Cologuard)  Discontinued    Health Maintenance  Health Maintenance Due  Topic Date Due   COVID-19 Vaccine (7 - 2023-24 season) 10/25/2021   DTaP/Tdap/Td (3 - Td or Tdap) 12/04/2021    Colorectal cancer screening: Type of screening: Cologuard. Completed 11/06/19. Repeat every as directed  years  Mammogram status: Completed 03/09/22. Repeat every year  Bone Density status: Completed 07/26/21. Results reflect: Bone density results: OSTEOPENIA. Repeat every 2 years.   Additional Screening:  Hepatitis C Screening:  Completed 06/30/20  Vision Screening: Recommended annual ophthalmology exams for early detection of glaucoma and other disorders of the eye. Is the patient up to date with their annual eye exam?  Yes  Who is the provider or what is the name of the office in which the patient attends annual eye  exams? [facility]  If pt is not established with a provider, would they like to be referred to a provider to establish care? No .   Dental Screening: Recommended annual dental exams for proper oral hygiene   Community Resource Referral / Chronic Care Management: CRR required this visit?  No   CCM required this visit?  No     Plan:     I have personally reviewed and noted the following in the patient's chart:   Medical and social history Use of alcohol, tobacco or illicit drugs  Current medications and supplements including opioid prescriptions. Patient is not currently taking opioid prescriptions. Functional ability and status Nutritional status Physical activity Advanced directives List of other physicians Hospitalizations, surgeries, and ER visits in previous 12 months Vitals Screenings to include cognitive, depression, and falls Referrals and appointments  In addition, I have reviewed and discussed with patient certain preventive protocols, quality metrics, and best practice recommendations. A written personalized care plan for preventive services as well as general preventive health recommendations were provided to patient.     Marzella Schlein, LPN   04/20/6576   After Visit Summary: (MyChart) Due to this being a telephonic visit, the after visit summary with patients personalized plan was offered to patient via MyChart   Nurse Notes: none

## 2022-08-08 NOTE — Patient Instructions (Signed)
[name] , Thank you for taking time to come for your Medicare Wellness Visit. I appreciate your ongoing commitment to your health goals. Please review the following plan we discussed and let me know if I can assist you in the future.   These are the goals we discussed:  Goals      Weight (lb) < 200 lb (90.7 kg)     Wants to loose around 20-25 lbs. Is a member of weight watchers        This is a list of the screening recommended for you and due dates:  Health Maintenance  Topic Date Due   Medicare Annual Wellness Visit  02/26/2021   COVID-19 Vaccine (7 - 2023-24 season) 10/25/2021   DTaP/Tdap/Td vaccine (3 - Td or Tdap) 12/04/2021   Flu Shot  09/14/2022   Pneumonia Vaccine  Completed   DEXA scan (bone density measurement)  Completed   Hepatitis C Screening  Completed   Zoster (Shingles) Vaccine  Completed   HPV Vaccine  Aged Out   Cologuard (Stool DNA test)  Discontinued    Advanced directives: copies in chart   Conditions/risks identified: get back to 150 lbs and sleep better   Next appointment: Follow up in one year for your annual wellness visit    Preventive Care 65 Years and Older, Female Preventive care refers to lifestyle choices and visits with your health care provider that can promote health and wellness. What does preventive care include? A yearly physical exam. This is also called an annual well check. Dental exams once or twice a year. Routine eye exams. Ask your health care provider how often you should have your eyes checked. Personal lifestyle choices, including: Daily care of your teeth and gums. Regular physical activity. Eating a healthy diet. Avoiding tobacco and drug use. Limiting alcohol use. Practicing safe sex. Taking low-dose aspirin every day. Taking vitamin and mineral supplements as recommended by your health care provider. What happens during an annual well check? The services and screenings done by your health care provider during  your annual well check will depend on your age, overall health, lifestyle risk factors, and family history of disease. Counseling  Your health care provider may ask you questions about your: Alcohol use. Tobacco use. Drug use. Emotional well-being. Home and relationship well-being. Sexual activity. Eating habits. History of falls. Memory and ability to understand (cognition). Work and work Astronomer. Reproductive health. Screening  You may have the following tests or measurements: Height, weight, and BMI. Blood pressure. Lipid and cholesterol levels. These may be checked every 5 years, or more frequently if you are over 56 years old. Skin check. Lung cancer screening. You may have this screening every year starting at age 78 if you have a 30-pack-year history of smoking and currently smoke or have quit within the past 15 years. Fecal occult blood test (FOBT) of the stool. You may have this test every year starting at age 78. Flexible sigmoidoscopy or colonoscopy. You may have a sigmoidoscopy every 5 years or a colonoscopy every 10 years starting at age 78. Hepatitis C blood test. Hepatitis B blood test. Sexually transmitted disease (STD) testing. Diabetes screening. This is done by checking your blood sugar (glucose) after you have not eaten for a while (fasting). You may have this done every 1-3 years. Bone density scan. This is done to screen for osteoporosis. You may have this done starting at age 78. Mammogram. This may be done every 1-2 years. Talk to your health  care provider about how often you should have regular mammograms. Talk with your health care provider about your test results, treatment options, and if necessary, the need for more tests. Vaccines  Your health care provider may recommend certain vaccines, such as: Influenza vaccine. This is recommended every year. Tetanus, diphtheria, and acellular pertussis (Tdap, Td) vaccine. You may need a Td booster every 10  years. Zoster vaccine. You may need this after age 78. Pneumococcal 13-valent conjugate (PCV13) vaccine. One dose is recommended after age 78. Pneumococcal polysaccharide (PPSV23) vaccine. One dose is recommended after age 78. Talk to your health care provider about which screenings and vaccines you need and how often you need them. This information is not intended to replace advice given to you by your health care provider. Make sure you discuss any questions you have with your health care provider. Document Released: 02/26/2015 Document Revised: 10/20/2015 Document Reviewed: 12/01/2014 Elsevier Interactive Patient Education  2017 [place] Prevention in the Home Falls can cause injuries. They can happen to people of all ages. There are many things you can do to make your home safe and to help prevent falls. What can I do on the outside of my home? Regularly fix the edges of walkways and driveways and fix any cracks. Remove anything that might make you trip as you walk through a door, such as a raised step or threshold. Trim any bushes or trees on the path to your home. Use bright outdoor lighting. Clear any walking paths of anything that might make someone trip, such as rocks or tools. Regularly check to see if handrails are loose or broken. Make sure that both sides of any steps have handrails. Any raised decks and porches should have guardrails on the edges. Have any leaves, snow, or ice cleared regularly. Use sand or salt on walking paths during winter. Clean up any spills in your garage right away. This includes oil or grease spills. What can I do in the bathroom? Use night lights. Install grab bars by the toilet and in the tub and shower. Do not use towel bars as grab bars. Use non-skid mats or decals in the tub or shower. If you need to sit down in the shower, use a plastic, non-slip stool. Keep the floor dry. Clean up any water that spills on the floor as soon as it  happens. Remove soap buildup in the tub or shower regularly. Attach bath mats securely with double-sided non-slip rug tape. Do not have throw rugs and other things on the floor that can make you trip. What can I do in the bedroom? Use night lights. Make sure that you have a light by your bed that is easy to reach. Do not use any sheets or blankets that are too big for your bed. They should not hang down onto the floor. Have a firm chair that has side arms. You can use this for support while you get dressed. Do not have throw rugs and other things on the floor that can make you trip. What can I do in the kitchen? Clean up any spills right away. Avoid walking on wet floors. Keep items that you use a lot in easy-to-reach places. If you need to reach something above you, use a strong step stool that has a grab bar. Keep electrical cords out of the way. Do not use floor polish or wax that makes floors slippery. If you must use wax, use non-skid floor wax. Do not have throw  rugs and other things on the floor that can make you trip. What can I do with my stairs? Do not leave any items on the stairs. Make sure that there are handrails on both sides of the stairs and use them. Fix handrails that are broken or loose. Make sure that handrails are as long as the stairways. Check any carpeting to make sure that it is firmly attached to the stairs. Fix any carpet that is loose or worn. Avoid having throw rugs at the top or bottom of the stairs. If you do have throw rugs, attach them to the floor with carpet tape. Make sure that you have a light switch at the top of the stairs and the bottom of the stairs. If you do not have them, ask someone to add them for you. What else can I do to help prevent falls? Wear shoes that: Do not have high heels. Have rubber bottoms. Are comfortable and fit you well. Are closed at the toe. Do not wear sandals. If you use a stepladder: Make sure that it is fully opened.  Do not climb a closed stepladder. Make sure that both sides of the stepladder are locked into place. Ask someone to hold it for you, if possible. Clearly mark and make sure that you can see: Any grab bars or handrails. First and last steps. Where the edge of each step is. Use tools that help you move around (mobility aids) if they are needed. These include: Canes. Walkers. Scooters. Crutches. Turn on the lights when you go into a dark area. Replace any light bulbs as soon as they burn out. Set up your furniture so you have a clear path. Avoid moving your furniture around. If any of your floors are uneven, fix them. If there are any pets around you, be aware of where they are. Review your medicines with your doctor. Some medicines can make you feel dizzy. This can increase your chance of falling. Ask your doctor what other things that you can do to help prevent falls. This information is not intended to replace advice given to you by your health care provider. Make sure you discuss any questions you have with your health care provider. Document Released: 11/26/2008 Document Revised: 07/08/2015 Document Reviewed: 03/06/2014 Elsevier Interactive Patient Education  2017 [employer].

## 2022-08-16 ENCOUNTER — Other Ambulatory Visit (INDEPENDENT_AMBULATORY_CARE_PROVIDER_SITE_OTHER): Payer: Medicare Other

## 2022-08-16 DIAGNOSIS — Z79899 Other long term (current) drug therapy: Secondary | ICD-10-CM

## 2022-08-16 LAB — BASIC METABOLIC PANEL
BUN: 25 mg/dL — ABNORMAL HIGH (ref 6–23)
CO2: 21 mEq/L (ref 19–32)
Calcium: 9.7 mg/dL (ref 8.4–10.5)
Chloride: 106 mEq/L (ref 96–112)
Creatinine, Ser: 1.42 mg/dL — ABNORMAL HIGH (ref 0.40–1.20)
GFR: 35.61 mL/min — ABNORMAL LOW (ref >60.00)
Glucose, Bld: 125 mg/dL — ABNORMAL HIGH (ref 70–99)
Potassium: 3.6 mEq/L (ref 3.5–5.1)
Sodium: 140 mEq/L (ref 135–145)

## 2022-08-16 NOTE — Progress Notes (Signed)
Creat has worsened again.  Refer to nephrologist.  Make sure drinking a lot of water and not taking NSAID's

## 2022-08-18 ENCOUNTER — Other Ambulatory Visit: Payer: Self-pay | Admitting: *Deleted

## 2022-08-18 DIAGNOSIS — R7989 Other specified abnormal findings of blood chemistry: Secondary | ICD-10-CM

## 2022-08-26 ENCOUNTER — Encounter: Payer: Self-pay | Admitting: Family Medicine

## 2022-08-28 DIAGNOSIS — F339 Major depressive disorder, recurrent, unspecified: Secondary | ICD-10-CM | POA: Diagnosis not present

## 2022-08-28 DIAGNOSIS — F4010 Social phobia, unspecified: Secondary | ICD-10-CM | POA: Diagnosis not present

## 2022-09-04 DIAGNOSIS — F4010 Social phobia, unspecified: Secondary | ICD-10-CM | POA: Diagnosis not present

## 2022-09-04 DIAGNOSIS — F339 Major depressive disorder, recurrent, unspecified: Secondary | ICD-10-CM | POA: Diagnosis not present

## 2022-09-13 ENCOUNTER — Encounter (INDEPENDENT_AMBULATORY_CARE_PROVIDER_SITE_OTHER): Payer: Self-pay

## 2022-09-14 DIAGNOSIS — F4010 Social phobia, unspecified: Secondary | ICD-10-CM | POA: Diagnosis not present

## 2022-09-14 DIAGNOSIS — F339 Major depressive disorder, recurrent, unspecified: Secondary | ICD-10-CM | POA: Diagnosis not present

## 2022-09-20 NOTE — Progress Notes (Signed)
BH MD Outpatient Progress Note  09/22/2022 10:58 AM [name]  MRN:  [id number]  Assessment:  [name] presents for follow-up evaluation. Today, 09/22/22, patient reports overall stability of mood and anxiety as well as mild improvement in sleep with use of Remeron and CBT-i. Unfortunately, she continues to experience significant appetite stimulation and associated weight gain with use of Remeron and does not wish to continue. As Remeron is being primarily used as sleep aid rather than antidepressant, discussed option to use only as needed to which she was amenable. She attempted brief trial of doxylamine without benefit although also did not experience any side effects; discussed option to trial higher dose if needed.  RTC in 8 weeks by video.  Identifying Information: [name] is a 77 y.o. female with a history of MDD, anxiety, arthritis, HTN, and prediabetes who is an established patient with [facility] participating in follow-up via video conferencing.   Plan:  # GAD  MDD in remission Past medication trials: Remeron (appetite increase and weight gain); Klonopin Status of problem: stable Interventions: -- Continue Effexor 150 mg daily  # Insomnia Past medication trials: Restoril, Klonopin, Remeron (appetite increase and weight gain), doxepin (ineffective), trazodone (nausea, diarrhea, dizziness), Atarax ("jumpy"), gabapentin ("dopey") Status of problem: chronic with mild improvement Interventions: -- CHANGE use of Remeron to 7.5-15 mg nightly PRN insomnia -- Patient to trial doxylamine 50 mg nightly PRN sleep if Remeron ineffective -- Continue Ambien 7.5 mg nightly   -- Risks, benefits, and side effects including but not limited to drowsiness and daytime sedation, dizziness, decreased mental alertness, falls and related injuries, increased risk for dementia was discussed   -- Have discussed plan for gradual taper and  discontinuation of this medication over time given lack of indication for long term use -- Patient has established with CBT-i therapist Nelwyn Salisbury LCSW at [facility] -- Sleep hygiene reviewed -- Sleep study 05/10/20: mildest OSA; CPAP not recommended; could consider dental device  Patient was given contact information for behavioral health clinic and was instructed to call 911 for emergencies.   Subjective:  Chief Complaint:  Chief Complaint  Patient presents with   Medication Management    Interval History:   Patient reports she is doing "mostly good" and is sleeping better although still not great; has found CBT-i very helpful. Reports she has decreased Remeron to 7.5-15 mg nightly although continues to have increased appetite and weight gain. She reports this is worsening arthritis pain and mobility. Would like to stop Remeron at this time although does feel like it can be helpful at times for sleep. Discussed that as we are using Remeron for sleep instead of depression, can convert to using just as needed instead of scheduled and she is agreeable to this. Reports mood has been "pretty good." Anxiety has been "better" and identifies CBT has helped her put things in perspective. Will be starting chair yoga and going back to church. Discussed importance of physical exercise and activity in promoting sleep. She did try doxylamine - denies adverse effects but didn't find it helpful. Amenable to trying higher dose of 50 mg if needed. Prefers to remain at current dosing of Ambien while making changes to other medications for sleep.   PDMP: -- Zolpidem 5 mg QTY 45 last filled 09/07/22 (regular rx dating back to Jan 2023)  Visit Diagnosis:    ICD-10-CM   1. Primary insomnia  F51.01 zolpidem (AMBIEN) 5 MG tablet  2. Generalized anxiety disorder  F41.1     3. MDD (recurrent major depressive disorder) in remission (HCC)  F33.40      Past Psychiatric History:  Diagnoses: MDD with  anxiety Medication trials: Restoril, Remeron (appetite increase and weight gain), doxepin (ineffective), trazodone (nausea, diarrhea, dizziness), melatonin, Atarax ("jumpy"), gabapentin ("dopey") Suicide attempts: denies Substance use: denies tobacco or illicit drug use; etoh: 1 glass/month  Past Medical History:  Past Medical History:  Diagnosis Date   Allergy 1980's   Sulfa drugs and penecillin   Anxiety    Cataract 2021   Corrected   Depression    Headache    SINUS   Hyperlipidemia    Hypertension    Osteopenia    Osteopenia    Primary localized osteoarthritis of right knee     Past Surgical History:  Procedure Laterality Date   BREAST EXCISIONAL BIOPSY Right    benign cyst removal    CARPAL TUNNEL RELEASE Right 2015   Ortho in [place]   CHOLECYSTECTOMY N/A 02/08/2022   Procedure: LAPAROSCOPIC CHOLECYSTECTOMY WITH INTRAOPERATIVE CHOLANGIOGRAM;  Surgeon: Romie Levee, MD;  Location: [facility];  Service: General;  Laterality: N/A;   EYE SURGERY  12/2019   cataracts   JOINT REPLACEMENT  09/2015   Right knee   LASIK     MENISCUS REPAIR Right 2011   [facility]thopedic in [facility]   TOTAL KNEE ARTHROPLASTY Right 09/20/2015   TOTAL KNEE ARTHROPLASTY Right 09/20/2015   Procedure: TOTAL KNEE ARTHROPLASTY;  Surgeon: Salvatore Marvel, MD;  Location: [facility] OR;  Service: Orthopedics;  Laterality: Right;   Trigger Thumb Right 2015   Done in [facility] at the same time as the carpal tunnel release   TUBAL LIGATION  1980    Family Psychiatric History:  Mothe: anxiety Maternal aunt: alcohol use, anorexia   Family History:  Family History  Problem Relation Age of Onset   Heart disease Mother    Arthritis Mother    Heart failure Mother    Heart attack Mother    Anxiety disorder Mother    Depression Mother    Hearing loss Mother    Varicose Veins Mother    Early death Father    Heart attack Father    Hearing loss Father    Heart disease Father    Hyperlipidemia Sister    Heart disease  Sister    Hearing loss Sister    Arthritis Sister    Irregular heart beat Sister    Hypertension Sister    Hypertension Brother    Hyperlipidemia Brother    Heart attack Brother    Asthma Brother    Alcohol abuse Brother    Heart disease Brother    Early death Brother    Heart attack Brother    Depression Son    Alcohol abuse Maternal Aunt     Social History:  Social History   Socioeconomic History   Marital status: Married    Spouse name: Lawrence   Number of children: 1   Years of education: 17.5   Highest education level: Master's degree (e.g., MA, MS, MEng, MEd, MSW, MBA)  Occupational History   Occupation: retired    Comment: Comptroller  Tobacco Use   Smoking status: Never   Smokeless tobacco: Never  Vaping Use   Vaping status: Never Used  Substance and Sexual Activity   Alcohol use: Not Currently    Comment: 1 glass a month   Drug use: Yes    Types: Benzodiazepines   Sexual  activity: Yes    Birth control/protection: Post-menopausal, None  Other Topics Concern   Not on file  Social History Narrative   Reads paper, walks, housework errands. Caffeine use daily.   3 grands   Social Determinants of Health   Financial Resource Strain: Low Risk  (08/04/2022)   Overall Financial Resource Strain (CARDIA)    Difficulty of Paying Living Expenses: Not hard at all  Food Insecurity: No Food Insecurity (08/04/2022)   Hunger Vital Sign    Worried About Running Out of Food in the Last Year: Never true    Ran Out of Food in the Last Year: Never true  Transportation Needs: No Transportation Needs (08/04/2022)   PRAPARE - Administrator, Civil Service (Medical): No    Lack of Transportation (Non-Medical): No  Physical Activity: Insufficiently Active (08/04/2022)   Exercise Vital Sign    Days of Exercise per Week: 1 day    Minutes of Exercise per Session: 20 min  Stress: Stress Concern Present (08/04/2022)   [employer] of Occupational Health - Occupational  Stress Questionnaire    Feeling of Stress : Rather much  Social Connections: Moderately Integrated (08/04/2022)   Social Connection and Isolation Panel [NHANES]    Frequency of Communication with Friends and Family: Twice a week    Frequency of Social Gatherings with Friends and Family: Once a week    Attends Religious Services: Never    Database administrator or Organizations: Yes    Attends Engineer, structural: More than 4 times per year    Marital Status: Married  Recent Concern: Social Connections - Moderately Isolated (05/11/2022)   Social Connection and Isolation Panel [NHANES]    Frequency of Communication with Friends and Family: Three times a week    Frequency of Social Gatherings with Friends and Family: Twice a week    Attends Religious Services: Never    Database administrator or Organizations: No    Attends Engineer, structural: Not on file    Marital Status: Married    Allergies:  Allergies  Allergen Reactions   Penicillins Hives    Tolerates Cephalosporins    Prednisone Other (See Comments)    Hyperactivity, sleep disturbance, confusion   Sulfa Antibiotics Hives         Current Medications: Current Outpatient Medications  Medication Sig Dispense Refill   doxylamine, Sleep, (UNISOM) 25 MG tablet Take 2 tablets (50 mg total) by mouth at bedtime as needed for sleep.     venlafaxine XR (EFFEXOR-XR) 150 MG 24 hr capsule Take 1 capsule (150 mg total) by mouth daily with breakfast. 90 capsule 3   amLODipine-benazepril (LOTREL) 5-20 MG capsule Take 1 capsule by mouth daily. 90 capsule 3   atorvastatin (LIPITOR) 20 MG tablet Take 1 tablet (20 mg total) by mouth daily. 90 tablet 3   CALCIUM PO Take 1,200 mg by mouth daily.     Cholecalciferol (D3 PO) Take 10,000 Units by mouth daily.     diclofenac Sodium (VOLTAREN) 1 % GEL Apply 1 Application topically 2 (two) times daily.     fluticasone (FLONASE) 50 MCG/ACT nasal spray Place 1 spray into both  nostrils daily as needed for allergies or rhinitis.     lidocaine (XYLOCAINE) 2 % solution Use as directed 10 mLs in the mouth or throat every 6 (six) hours as needed for mouth pain. 100 mL 3   mirtazapine (REMERON) 15 MG tablet Take 0.5-1 tablets (7.5-15 mg total)  by mouth at bedtime as needed (sleep). 30 tablet 2   Multiple Vitamin (MULTIVITAMIN ADULT PO) Take 1 tablet by mouth daily.     raloxifene (EVISTA) 60 MG tablet Take 1 tablet (60 mg total) by mouth daily. 90 tablet 3   triamterene-hydrochlorothiazide (DYAZIDE) 37.5-25 MG capsule TAKE ONE CAPSULE BY MOUTH ONE TIME A DAY 90 capsule 3   [START ON 10/07/2022] zolpidem (AMBIEN) 5 MG tablet Take 1.5 tablets (7.5 mg total) by mouth at bedtime as needed for sleep. 45 tablet 1   No current facility-administered medications for this visit.    ROS: Reports appetite increase and weight gain  Objective:  Psychiatric Specialty Exam: There were no vitals taken for this visit.There is no height or weight on file to calculate BMI.  General Appearance: Casual, Neat, and Well Groomed  Eye Contact:  Good  Speech:  Clear and Coherent and Normal Rate  Volume:  Normal  Mood:   "pretty good"  Affect:   Euthymic; calm  Thought Content:  Denies AVH; IOR; paranoia    Suicidal Thoughts:  No  Homicidal Thoughts:  No  Thought Process:  Goal Directed and Linear  Orientation:  Full (Time, Place, and Person)    Memory:   Grossly intact  Judgment:  Good  Insight:  Good  Concentration:  Concentration: Good  Recall:  NA  Fund of Knowledge: Good  Language: Good  Psychomotor Activity:  Normal  Akathisia:  No  AIMS (if indicated): not done  Assets:  Communication Skills Desire for Improvement Financial Resources/Insurance Housing Intimacy Leisure Time Physical Health Resilience Social Support Talents/Skills Transportation  ADL's:  Intact  Cognition: WNL  Sleep:   Improving   PE: General: sits comfortably in view of camera; no acute  distress  Pulm: no increased work of breathing on room air  MSK: all extremity movements appear intact  Neuro: no focal neurological deficits observed  Gait & Station: unable to assess by video   Metabolic Disorder Labs: Lab Results  Component Value Date   HGBA1C 6.0 04/17/2022   MPG 131 01/17/2019   MPG 128 11/28/2017   No results found for: "PROLACTIN" Lab Results  Component Value Date   CHOL 172 04/17/2022   TRIG 123.0 04/17/2022   HDL 69.80 04/17/2022   CHOLHDL 2 04/17/2022   VLDL 24.6 04/17/2022   LDLCALC 77 04/17/2022   LDLCALC 94 06/30/2021   Lab Results  Component Value Date   TSH 3.07 04/17/2022   TSH 4.01 01/08/2017    Therapeutic Level Labs: No results found for: "LITHIUM" No results found for: "VALPROATE" No results found for: "CBMZ"  Screenings:  GAD-7    Flowsheet Row Office Visit from 06/09/2022 in [facility] from 04/17/2022 in [facility] Video Visit from 03/17/2022 in [facility] Office Visit from 02/14/2022 in [facility] at [facility] Office Visit from 11/11/2021 in [facility] & Sports Medicine at [facility]  Total GAD-7 Score 16 18 9 7 6       PHQ2-9    Flowsheet Row Clinical Support from 08/08/2022 in [facility] Office Visit from 06/09/2022 in [facility] Office Visit from 04/17/2022 in [facility] Video Visit from 03/17/2022 in [facility] Office Visit from 02/14/2022 in [facility] at [facility]  PHQ-2 Total Score 2 2 4 2 2   PHQ-9 Total Score  6 11 15 7 12       Flowsheet Row ED to Hosp-Admission (Discharged) from 02/05/2022 in [facility] ED from 12/25/2021 in [facility] at [facility] Video Visit from  10/19/2021 in [facility]  C-SSRS RISK CATEGORY No Risk No Risk Error: Q7 should not be populated when Q6 is No       Collaboration of Care: Collaboration of Care: Medication Management AEB ongoing medication management and Psychiatrist AEB established with this provider  Patient/Guardian was advised Release of Information must be obtained prior to any record release in order to collaborate their care with an outside provider. Patient/Guardian was advised if they have not already done so to contact the registration department to sign all necessary forms in order for [place] to release information regarding their care.   Consent: Patient/Guardian gives verbal consent for treatment and assignment of benefits for services provided during this visit. Patient/Guardian expressed understanding and agreed to proceed.   Virtual Visit via Video Note  I connected with [name] on 09/22/22 at 10:30 AM EDT by a video enabled telemedicine application and verified that I am speaking with the correct person using two identifiers.  Location: Patient: home address in [place] Provider: remote office in [place]   I discussed the limitations of evaluation and management by telemedicine and the availability of in person appointments. The patient expressed understanding and agreed to proceed.   I discussed the assessment and treatment plan with the patient. The patient was provided an opportunity to ask questions and all were answered. The patient agreed with the plan and demonstrated an understanding of the instructions.   The patient was advised to call back or seek an in-person evaluation if the symptoms worsen or if the condition fails to improve as anticipated.  I provided 30 minutes dedicated to the care of this patient via video on the date of this encounter to include chart review, face-to-face time with the patient, medication management/counseling, and documentation.   A   09/22/2022, 10:58 AM

## 2022-09-22 ENCOUNTER — Telehealth (INDEPENDENT_AMBULATORY_CARE_PROVIDER_SITE_OTHER): Payer: Medicare Other | Admitting: Psychiatry

## 2022-09-22 ENCOUNTER — Encounter (HOSPITAL_COMMUNITY): Payer: Self-pay | Admitting: Psychiatry

## 2022-09-22 DIAGNOSIS — F3340 Major depressive disorder, recurrent, in remission, unspecified: Secondary | ICD-10-CM | POA: Diagnosis not present

## 2022-09-22 DIAGNOSIS — F411 Generalized anxiety disorder: Secondary | ICD-10-CM

## 2022-09-22 DIAGNOSIS — F5101 Primary insomnia: Secondary | ICD-10-CM

## 2022-09-22 MED ORDER — DOXYLAMINE SUCCINATE (SLEEP) 25 MG PO TABS: 50.0000 mg | ORAL_TABLET | Freq: Every evening | ORAL | Status: DC | PRN

## 2022-09-22 MED ORDER — MIRTAZAPINE 15 MG PO TABS: 7.5000 mg | ORAL_TABLET | Freq: Every evening | ORAL | 2 refills | Status: DC | PRN

## 2022-09-22 MED ORDER — ZOLPIDEM TARTRATE 5 MG PO TABS
7.5000 mg | ORAL_TABLET | Freq: Every evening | ORAL | 1 refills | Status: DC | PRN
Start: 2022-10-07 — End: 2022-11-29

## 2022-09-22 NOTE — Patient Instructions (Addendum)
Thank you for attending your appointment today.  -- STOP taking Remeron nightly and instead only use 7.5-15 mg nightly as needed for sleep -- You may try doxylamine 25-50 mg nightly as needed for sleep if Remeron is not effective -- Continue other medications as prescribed.  Please do not make any changes to medications without first discussing with your provider. If you are experiencing a psychiatric emergency, please call 911 or present to your nearest emergency department. Additional crisis, medication management, and therapy resources are included below.  [facility]  [address], [place], [place] [address] [phone] WALK-IN URGENT CARE 24/7 FOR ANYONE [address], [place], [place]  [phone] Fax: [phone] [email or web address] *Interpreters available *Accepts all insurance and uninsured for Urgent Care needs *Accepts Medicaid and uninsured for outpatient treatment (below)      ONLY FOR [facility]  Below:    Outpatient New Patient Assessment/Therapy Walk-ins:        Monday -Thursday 8am until slots are full.        Every Friday 1pm-4pm  (first come, first served)                   New Patient Psychiatry/Medication Management        Monday-Friday 8am-11am (first come, first served)               For all walk-ins we ask that you arrive by 7:15am, because patients will be seen in the order of arrival.

## 2022-09-27 ENCOUNTER — Ambulatory Visit (INDEPENDENT_AMBULATORY_CARE_PROVIDER_SITE_OTHER): Payer: Medicare Other | Admitting: Family Medicine

## 2022-09-27 ENCOUNTER — Encounter: Payer: Self-pay | Admitting: Family Medicine

## 2022-09-27 VITALS — BP 130/70 | HR 95 | Temp 98.0°F | Resp 18 | Ht 62.0 in | Wt 195.0 lb

## 2022-09-27 DIAGNOSIS — R7989 Other specified abnormal findings of blood chemistry: Secondary | ICD-10-CM

## 2022-09-27 DIAGNOSIS — R635 Abnormal weight gain: Secondary | ICD-10-CM | POA: Diagnosis not present

## 2022-09-27 DIAGNOSIS — F418 Other specified anxiety disorders: Secondary | ICD-10-CM

## 2022-09-27 NOTE — Patient Instructions (Addendum)
Dyazide(triamterine/hct)-try holding it and see if swelling in legs and monitor blood pressures.  If increase, go back to it.    Wegovy and zepbound

## 2022-09-27 NOTE — Progress Notes (Signed)
Subjective:     Patient ID: [name], female    DOB: 02-01-1946, 77 y.o.   MRN: [id number]  Chief Complaint  Patient presents with   Discuss weightloss medication    Discuss weight loss medication    HPI  Weight management - She has been struggling with weight gain since 01/2022 and is interested in her options of weight loss medications. Has been walking and doing chair yoga. We discussed her possible options for weight loss. No fmhx of Medullary thyroid cancer.   Depression/Anxiety - Her depression has improved, plans to start tapering off mirtazapine. She has continued to  follow up with psychiatry. She states her mood is more mixed depression and anxiety. Sleep is better.  Thinks the mirtazipine caused wt gain.    Nephrology referral - Was referred to nephrology due to elevated creatine about 6 weeks ago, she has not heard back yet. Has been drinking plenty water and not taking NSAIDs.  Health Maintenance Due  Topic Date Due   DTaP/Tdap/Td (3 - Td or Tdap) 12/04/2021    Past Medical History:  Diagnosis Date   Allergy 1980's   Sulfa drugs and penecillin   Anxiety    Cataract 2021   Corrected   Depression    Headache    SINUS   Hyperlipidemia    Hypertension    Osteopenia    Osteopenia    Primary localized osteoarthritis of right knee     Past Surgical History:  Procedure Laterality Date   BREAST EXCISIONAL BIOPSY Right    benign cyst removal    CARPAL TUNNEL RELEASE Right 2015   Ortho in [place]   CHOLECYSTECTOMY N/A 02/08/2022   Procedure: LAPAROSCOPIC CHOLECYSTECTOMY WITH INTRAOPERATIVE CHOLANGIOGRAM;  Surgeon: Romie Levee, MD;  Location: [facility];  Service: General;  Laterality: N/A;   EYE SURGERY  12/2019   cataracts   JOINT REPLACEMENT  09/2015   Right knee   LASIK     MENISCUS REPAIR Right 2011   [facility]opedic in [facility]   TOTAL KNEE ARTHROPLASTY Right 09/20/2015   TOTAL KNEE ARTHROPLASTY Right 09/20/2015   Procedure: TOTAL KNEE  ARTHROPLASTY;  Surgeon: Salvatore Marvel, MD;  Location: [facility] OR;  Service: Orthopedics;  Laterality: Right;   Trigger Thumb Right 2015   Done in [facility] at the same time as the carpal tunnel release   TUBAL LIGATION  1980     Current Outpatient Medications:    amLODipine-benazepril (LOTREL) 5-20 MG capsule, Take 1 capsule by mouth daily., Disp: 90 capsule, Rfl: 3   atorvastatin (LIPITOR) 20 MG tablet, Take 1 tablet (20 mg total) by mouth daily., Disp: 90 tablet, Rfl: 3   CALCIUM PO, Take 1,200 mg by mouth daily., Disp: , Rfl:    Cholecalciferol (D3 PO), Take 10,000 Units by mouth daily., Disp: , Rfl:    diclofenac Sodium (VOLTAREN) 1 % GEL, Apply 1 Application topically 2 (two) times daily., Disp: , Rfl:    fluticasone (FLONASE) 50 MCG/ACT nasal spray, Place 1 spray into both nostrils daily as needed for allergies or rhinitis., Disp: , Rfl:    lidocaine (XYLOCAINE) 2 % solution, Use as directed 10 mLs in the mouth or throat every 6 (six) hours as needed for mouth pain., Disp: 100 mL, Rfl: 3   mirtazapine (REMERON) 15 MG tablet, Take 0.5-1 tablets (7.5-15 mg total) by mouth at bedtime as needed (sleep)., Disp: 30 tablet, Rfl: 2   Multiple Vitamin (MULTIVITAMIN ADULT PO), Take 1 tablet by mouth daily., Disp: ,  Rfl:    raloxifene (EVISTA) 60 MG tablet, Take 1 tablet (60 mg total) by mouth daily., Disp: 90 tablet, Rfl: 3   triamterene-hydrochlorothiazide (DYAZIDE) 37.5-25 MG capsule, TAKE ONE CAPSULE BY MOUTH ONE TIME A DAY, Disp: 90 capsule, Rfl: 3   venlafaxine XR (EFFEXOR-XR) 150 MG 24 hr capsule, Take 1 capsule (150 mg total) by mouth daily with breakfast., Disp: 90 capsule, Rfl: 3   [START ON 10/07/2022] zolpidem (AMBIEN) 5 MG tablet, Take 1.5 tablets (7.5 mg total) by mouth at bedtime as needed for sleep., Disp: 45 tablet, Rfl: 1  Allergies  Allergen Reactions   Penicillins Hives    Tolerates Cephalosporins    Prednisone Other (See Comments)    Hyperactivity, sleep disturbance, confusion    Sulfa Antibiotics Hives        ROS neg/noncontributory except as noted HPI/below      Objective:     BP 130/70   Pulse 95   Temp 98 F (36.7 C) (Temporal)   Resp 18   Ht 5\' 2"  (1.575 m)   Wt 195 lb (88.5 kg)   SpO2 97%   BMI 35.67 kg/m  Wt Readings from Last 3 Encounters:  09/27/22 195 lb (88.5 kg)  08/08/22 188 lb 12.8 oz (85.6 kg)  06/09/22 181 lb (82.1 kg)    Physical Exam   Gen: WDWN NAD HEENT: NCAT, conjunctiva not injected, sclera nonicteric MSK: no gross abnormalities.  NEURO: A&O x3.  CN II-XII intact.  PSYCH: normal mood. Good eye contact     Assessment & Plan:  Elevated serum creatinine  Weight gain  Anxiety associated with depression  1.  Elevated serum creatinine-consistent with CKD 3B.  Not taking any NSAIDs.  Drinks water.  Blood pressures are well-controlled.  She does take a diuretic daily.  Unclear exactly why (hypertension versus edema) she is willing to hold the Dyazide and see if any edema or if blood pressures become uncontrolled, will need to restart.  Advised to call nephrology and just get an update.  I am not overly concerned about the delay in referral.  She has a follow-up appointment with me in October, so we will repeat labs then if she has not been seen by nephrology 2.  Weight gain-BMI 35.  She is starting to work on diet/exercise.  Working on weaning mirtazapine.  Not sure if this was etiology, but this was the most recent medicine added.  She has been on Effexor for many years.  We had a long discussion on pros/cons/cost/side effects/risks of weight loss medications.  She can call her insurance to see if Zepbound or Reginal Lutes are covered (however.  Experiences that Medicare does not cover these at all) we did discuss how to take it as side effects.  Did discuss cost.  She will think about it.  Also discussed phentermine/Qsymia/Contrave-however, moods have been very stable and adding any of these meds may exacerbate her insomnia or moods.  She  will continue to work on diet/exercise.  Did spent time discussing lowering carbs and getting exercise. 3.  Anxiety/depression-chronic.  Managed by psych.  Better controlled.  Patient is struggling with her weight.  See discussion above   Return for as sch in Oct.  I,Rachel Rivera,acting as a scribe for Angelena Sole, MD.,have documented all relevant documentation on the behalf of Angelena Sole, MD,as directed by  Angelena Sole, MD while in the presence of Angelena Sole, MD.  I, Angelena Sole, MD, have reviewed all documentation  for this visit. The documentation on 09/27/22 for the exam, diagnosis, procedures, and orders are all accurate and complete.   Angelena Sole, MD

## 2022-09-28 DIAGNOSIS — F339 Major depressive disorder, recurrent, unspecified: Secondary | ICD-10-CM | POA: Diagnosis not present

## 2022-09-28 DIAGNOSIS — F4010 Social phobia, unspecified: Secondary | ICD-10-CM | POA: Diagnosis not present

## 2022-10-10 ENCOUNTER — Other Ambulatory Visit: Payer: Self-pay | Admitting: Family Medicine

## 2022-10-10 ENCOUNTER — Encounter: Payer: Self-pay | Admitting: Family Medicine

## 2022-10-10 MED ORDER — ZEPBOUND 2.5 MG/0.5ML ~~LOC~~ SOAJ: 2.5000 mg | SUBCUTANEOUS | 0 refills | Status: DC

## 2022-10-19 ENCOUNTER — Telehealth: Payer: Self-pay

## 2022-10-19 NOTE — Telephone Encounter (Signed)
Pharmacy Patient Advocate Encounter   Received notification from CoverMyMeds that prior authorization for Zepbound 2.5MG  is required/requested.   Insurance verification completed.   The patient is insured through [facility] .   Per test claim: PA required; PA submitted to [facility] via CoverMyMeds Key/confirmation #/EOC [id number] Status is pending

## 2022-10-20 ENCOUNTER — Other Ambulatory Visit (HOSPITAL_COMMUNITY): Payer: Self-pay

## 2022-10-20 NOTE — Telephone Encounter (Signed)
Pharmacy Patient Advocate Encounter  Received notification from [facility] that Prior Authorization for Zepbound 2.5MG /0.5ML pen-injectors has been APPROVED from 10/19/22 to 06/18/23. Ran test claim, Copay is $250. This test claim was processed through [facility]- copay amounts may vary at other pharmacies due to pharmacy/plan contracts, or as the patient moves through the different stages of their insurance plan.   PA #/Case ID/Reference #: [id number]

## 2022-10-20 NOTE — Telephone Encounter (Signed)
Patient notified of message below and verbalized understanding. 

## 2022-10-26 DIAGNOSIS — Z23 Encounter for immunization: Secondary | ICD-10-CM | POA: Diagnosis not present

## 2022-10-31 ENCOUNTER — Ambulatory Visit (HOSPITAL_COMMUNITY): Payer: Medicare Other

## 2022-11-01 DIAGNOSIS — E785 Hyperlipidemia, unspecified: Secondary | ICD-10-CM | POA: Diagnosis not present

## 2022-11-01 DIAGNOSIS — N200 Calculus of kidney: Secondary | ICD-10-CM | POA: Diagnosis not present

## 2022-11-01 DIAGNOSIS — N1831 Chronic kidney disease, stage 3a: Secondary | ICD-10-CM | POA: Diagnosis not present

## 2022-11-01 DIAGNOSIS — I129 Hypertensive chronic kidney disease with stage 1 through stage 4 chronic kidney disease, or unspecified chronic kidney disease: Secondary | ICD-10-CM | POA: Diagnosis not present

## 2022-11-02 DIAGNOSIS — N1831 Chronic kidney disease, stage 3a: Secondary | ICD-10-CM | POA: Diagnosis not present

## 2022-11-08 ENCOUNTER — Other Ambulatory Visit (HOSPITAL_COMMUNITY): Payer: Self-pay

## 2022-11-08 ENCOUNTER — Other Ambulatory Visit: Payer: Self-pay | Admitting: *Deleted

## 2022-11-08 ENCOUNTER — Encounter: Payer: Self-pay | Admitting: Family Medicine

## 2022-11-08 MED ORDER — ZEPBOUND 5 MG/0.5ML ~~LOC~~ SOAJ
5.0000 mg | SUBCUTANEOUS | 1 refills | Status: DC
  Filled 2022-11-08: qty 2, 28d supply, fill #0
  Filled 2022-12-01: qty 2, 28d supply, fill #1

## 2022-11-09 ENCOUNTER — Other Ambulatory Visit (HOSPITAL_COMMUNITY): Payer: Self-pay

## 2022-11-09 ENCOUNTER — Other Ambulatory Visit: Payer: Self-pay

## 2022-11-10 ENCOUNTER — Other Ambulatory Visit (HOSPITAL_COMMUNITY): Payer: Self-pay

## 2022-11-15 ENCOUNTER — Encounter: Payer: Self-pay | Admitting: Family Medicine

## 2022-11-15 ENCOUNTER — Ambulatory Visit (INDEPENDENT_AMBULATORY_CARE_PROVIDER_SITE_OTHER): Payer: Medicare Other | Admitting: Family Medicine

## 2022-11-15 VITALS — BP 125/69 | HR 85 | Temp 97.9°F | Resp 18 | Ht 62.0 in | Wt 195.5 lb

## 2022-11-15 DIAGNOSIS — E7800 Pure hypercholesterolemia, unspecified: Secondary | ICD-10-CM | POA: Diagnosis not present

## 2022-11-15 DIAGNOSIS — E66812 Obesity, class 2: Secondary | ICD-10-CM | POA: Diagnosis not present

## 2022-11-15 DIAGNOSIS — R7303 Prediabetes: Secondary | ICD-10-CM | POA: Diagnosis not present

## 2022-11-15 DIAGNOSIS — F5101 Primary insomnia: Secondary | ICD-10-CM | POA: Diagnosis not present

## 2022-11-15 DIAGNOSIS — I10 Essential (primary) hypertension: Secondary | ICD-10-CM

## 2022-11-15 DIAGNOSIS — E6609 Other obesity due to excess calories: Secondary | ICD-10-CM

## 2022-11-15 DIAGNOSIS — Z6835 Body mass index (BMI) 35.0-35.9, adult: Secondary | ICD-10-CM

## 2022-11-15 DIAGNOSIS — F411 Generalized anxiety disorder: Secondary | ICD-10-CM

## 2022-11-15 NOTE — Assessment & Plan Note (Signed)
Chronic.  Not ideal.  Managed by psych

## 2022-11-15 NOTE — Patient Instructions (Signed)
It was very nice to see you today!  Get Tdap at pharmacy  PLEASE NOTE:  If you had any lab tests please let us know if you have not heard back within a few days. You may see your results on MyChart before we have a chance to review them but we will give you a call once they are reviewed by us. If we ordered any referrals today, please let us know if you have not heard from their office within the next week.   Please try these tips to maintain a healthy lifestyle:  Eat most of your calories during the day when you are active. Eliminate processed foods including packaged sweets (pies, cakes, cookies), reduce intake of potatoes, white bread, white pasta, and white rice. Look for whole grain options, oat flour or almond flour.  Each meal should contain half fruits/vegetables, one quarter protein, and one quarter carbs (no bigger than a computer mouse).  Cut down on sweet beverages. This includes juice, soda, and sweet tea. Also watch fruit intake, though this is a healthier sweet option, it still contains natural sugar! Limit to 3 servings daily.  Drink at least 1 glass of water with each meal and aim for at least 8 glasses per day  Exercise at least 150 minutes every week.   

## 2022-11-15 NOTE — Assessment & Plan Note (Signed)
Chronic.  Pt working on diet/exercise.  On zepbound 5mg  weekly.  Will call for titration

## 2022-11-15 NOTE — Assessment & Plan Note (Signed)
Chronic.  Well controlled.  Continue lipitor 20mg 

## 2022-11-15 NOTE — Assessment & Plan Note (Signed)
Chronic.  Well controlled.  Continue Lotrel 5/20

## 2022-11-15 NOTE — Progress Notes (Signed)
Subjective:     Patient ID: [name], female    DOB: January 23, 1946, 77 y.o.   MRN: [id number]  Chief Complaint  Patient presents with   Medical Management of Chronic Issues    6 month follow-up on predm Not fasting   HPI   Pre DM - Pt is on Zepbound 5 mg injections once per week. Endorses a decreased appetite and feeling full faster. Reports that her scale at home has shown a 7 lb weight loss. Only SE is some heartburn  HTN - Pt is on amlodipine/benazepril 5-20 mg daily. Bp's running 120s/60s.  No ha/dizziness/cp/palp/edema/cough/sob.  Saw neph for elevated creat-did a lot of labs per pt and told ok.  No f/u needed.   HLD - Pt is on Lipitor 20 mg daily. No SE  Insomnia - Complains of continued insomnia that affects her moods. Sees psych  Health Maintenance Due  Topic Date Due   DTaP/Tdap/Td (3 - Td or Tdap) 12/04/2021    Past Medical History:  Diagnosis Date   Allergy 1980's   Sulfa drugs and penecillin   Anxiety    Cataract 2021   Corrected   Depression    Headache    SINUS   Hyperlipidemia    Hypertension    Osteopenia    Osteopenia    Primary localized osteoarthritis of right knee     Past Surgical History:  Procedure Laterality Date   BREAST EXCISIONAL BIOPSY Right    benign cyst removal    CARPAL TUNNEL RELEASE Right 2015   Ortho in [place]   CHOLECYSTECTOMY N/A 02/08/2022   Procedure: LAPAROSCOPIC CHOLECYSTECTOMY WITH INTRAOPERATIVE CHOLANGIOGRAM;  Surgeon: Romie Levee, MD;  Location: [facility];  Service: General;  Laterality: N/A;   EYE SURGERY  12/2019   cataracts   JOINT REPLACEMENT  09/2015   Right knee   LASIK     MENISCUS REPAIR Right 2011   [facility]opedic in [facility]   TOTAL KNEE ARTHROPLASTY Right 09/20/2015   TOTAL KNEE ARTHROPLASTY Right 09/20/2015   Procedure: TOTAL KNEE ARTHROPLASTY;  Surgeon: Salvatore Marvel, MD;  Location: [facility] OR;  Service: Orthopedics;  Laterality: Right;   Trigger Thumb Right 2015   Done in [facility] at the same  time as the carpal tunnel release   TUBAL LIGATION  1980     Current Outpatient Medications:    amLODipine-benazepril (LOTREL) 5-20 MG capsule, Take 1 capsule by mouth daily., Disp: 90 capsule, Rfl: 3   atorvastatin (LIPITOR) 20 MG tablet, Take 1 tablet (20 mg total) by mouth daily., Disp: 90 tablet, Rfl: 3   CALCIUM PO, Take 1,200 mg by mouth daily., Disp: , Rfl:    fluticasone (FLONASE) 50 MCG/ACT nasal spray, Place 1 spray into both nostrils daily as needed for allergies or rhinitis., Disp: , Rfl:    lidocaine (XYLOCAINE) 2 % solution, Use as directed 10 mLs in the mouth or throat every 6 (six) hours as needed for mouth pain., Disp: 100 mL, Rfl: 3   mirtazapine (REMERON) 15 MG tablet, Take 0.5-1 tablets (7.5-15 mg total) by mouth at bedtime as needed (sleep)., Disp: 30 tablet, Rfl: 2   Multiple Vitamin (MULTIVITAMIN ADULT PO), Take 1 tablet by mouth daily., Disp: , Rfl:    raloxifene (EVISTA) 60 MG tablet, Take 1 tablet (60 mg total) by mouth daily., Disp: 90 tablet, Rfl: 3   tirzepatide (ZEPBOUND) 5 MG/0.5ML Pen, Inject 5 mg into the skin once a week., Disp: 2 mL, Rfl: 1   triamterene-hydrochlorothiazide (DYAZIDE)  37.5-25 MG capsule, TAKE ONE CAPSULE BY MOUTH ONE TIME A DAY, Disp: 90 capsule, Rfl: 3   venlafaxine XR (EFFEXOR-XR) 150 MG 24 hr capsule, Take 1 capsule (150 mg total) by mouth daily with breakfast., Disp: 90 capsule, Rfl: 3   zolpidem (AMBIEN) 5 MG tablet, Take 1.5 tablets (7.5 mg total) by mouth at bedtime as needed for sleep., Disp: 45 tablet, Rfl: 1  Allergies  Allergen Reactions   Penicillins Hives    Tolerates Cephalosporins    Prednisone Other (See Comments)    Hyperactivity, sleep disturbance, confusion   Sulfa Antibiotics Hives        ROS neg/noncontributory except as noted HPI/below      Objective:     BP 125/69   Pulse 85   Temp 97.9 F (36.6 C) (Temporal)   Resp 18   Ht 5\' 2"  (1.575 m)   Wt 195 lb 8 oz (88.7 kg)   SpO2 98%   BMI 35.76 kg/m  Wt  Readings from Last 3 Encounters:  11/15/22 195 lb 8 oz (88.7 kg)  09/27/22 195 lb (88.5 kg)  08/08/22 188 lb 12.8 oz (85.6 kg)    Physical Exam   Gen: WDWN NAD HEENT: NCAT, conjunctiva not injected, sclera nonicteric NECK:  supple, no thyromegaly, no nodes, no carotid bruits CARDIAC: RRR, S1S2+, no murmur. DP 2+B LUNGS: CTAB. No wheezes ABDOMEN:  BS+, soft, NTND, No HSM, no masses EXT:  no edema MSK: no gross abnormalities.  NEURO: A&O x3.  CN II-XII intact.  PSYCH: normal mood. Good eye contact     Assessment & Plan:  Essential hypertension Assessment & Plan: Chronic.  Well controlled.  Continue Lotrel 5/20   Pure hypercholesterolemia Assessment & Plan: Chronic.  Well controlled.  Continue lipitor 20mg    Prediabetes Assessment & Plan: Chronic.  Pt working on diet/exercise.  On zepbound 5mg  weekly.  Will call for titration   Generalized anxiety disorder  Class 2 obesity due to excess calories without serious comorbidity with body mass index (BMI) of 35.0 to 35.9 in adult  Primary insomnia Assessment & Plan: Chronic.  Not ideal.  Managed by psych   Obesity-w/aortic atherosclerosis, HTN, HLD, preDM and psych meds-doing well on zepbound 5mg .  Will call for titration.  Can take pepcid for GERD.  Continue diet/exercise.  Return in about 6 months (around 05/16/2023) for chronic follow-up.   Germaine Pomfret Rice,acting as a scribe for Angelena Sole, MD.,have documented all relevant documentation on the behalf of Angelena Sole, MD,as directed by  Angelena Sole, MD while in the presence of Angelena Sole, MD.  I, Angelena Sole, MD, have reviewed all documentation for this visit. The documentation on 11/15/22 for the exam, diagnosis, procedures, and orders are all accurate and complete.   Angelena Sole, MD

## 2022-11-28 NOTE — Progress Notes (Unsigned)
BH MD Outpatient Progress Note  11/29/2022 11:50 AM [name]  MRN:  [id number]  Assessment:  [name] presents for follow-up evaluation. Today, 11/29/22, patient reports stability of mood and anxiety and identifies significant benefit from CBT. She continues to experience chronic insomnia although not as severe as it has been in the past as she is now getting about 5-6 hours nightly on average. She is using Remeron PRN with some benefit and is amenable to further reduction in dose as more potent antihistaminergic effects at lower doses can facilitate sleep for some patients. No other changes to plan of care at this time.  RTC in 3 months by video.  Identifying Information: [name] is a 77 y.o. female with a history of MDD, anxiety, arthritis, HTN, and prediabetes who is an established patient with [facility] participating in follow-up via video conferencing.   Plan:  # GAD  MDD in remission Past medication trials: Remeron (appetite increase and weight gain); Klonopin Status of problem: stable Interventions: -- Continue Effexor 150 mg daily  # Insomnia, chronic Past medication trials: Restoril, Klonopin, Remeron (appetite increase and weight gain), doxepin (ineffective), trazodone (nausea, diarrhea, dizziness), Atarax ("jumpy"), gabapentin ("dopey"), doxylamine (ineffective) Status of problem: chronic Interventions: -- DECREASE Remeron to 3.75-7.5 mg nightly PRN insomnia -- Continue Ambien 7.5 mg nightly   -- Risks, benefits, and side effects including but not limited to drowsiness and daytime sedation, dizziness, decreased mental alertness, falls and related injuries, increased risk for dementia was discussed   -- Have discussed plan for gradual taper and discontinuation of this medication over time given lack of indication for long term use -- Patient seen by CBT-i therapist Nelwyn Salisbury LCSW at [facility] -- Sleep  hygiene reviewed -- Sleep study 05/10/20: mildest OSA; CPAP not recommended; could consider dental device  Patient was given contact information for behavioral health clinic and was instructed to call 911 for emergencies.   Subjective:  Chief Complaint:  Chief Complaint  Patient presents with   Medication Management    Interval History:  [name] reports she is doing "fine" although has had some worsening insomnia this last week; fortunately slept better last night. Getting about 5-6 hours of sleep on average. Can't identify any clear triggers for worsening sleep this last week.   Continues to see CBT therapist and has found it helpful for better understanding anxiety. They have discussed sleep hygiene and importance of routine. Has tried listening to meditations and relaxation exercises at night. Describes mood as "less up and down." Denies passive/active SI.   Only using Remeron 7.5 mg nightly as needed; about 3 days a week. She reports weight loss since starting Zepbound. Amenable to trying Remeron 7.5 mg tablet so that she can break in half as this may be more helpful for sleep.  Continues to take Ambien nightly - risks/benefits reviewed and she denies dizziness or lightheadedness; falls; grogginess and wishes to continue for time being.   PDMP: -- Zolpidem 5 mg QTY 45 last filled 11/05/22 (regular rx dating back to Jan 2023)  Visit Diagnosis:    ICD-10-CM   1. Generalized anxiety disorder  F41.1 venlafaxine XR (EFFEXOR-XR) 150 MG 24 hr capsule    2. Primary insomnia  F51.01 zolpidem (AMBIEN) 5 MG tablet    3. MDD (recurrent major depressive disorder) in remission (HCC)  F33.40 venlafaxine XR (EFFEXOR-XR) 150 MG 24 hr capsule     Past Psychiatric History:  Diagnoses: MDD with anxiety Medication  trials: Restoril, Remeron (appetite increase and weight gain), doxepin (ineffective), trazodone (nausea, diarrhea, dizziness), melatonin, Atarax ("jumpy"), gabapentin ("dopey"), doxylamine  (ineffective) Suicide attempts: denies Substance use: denies tobacco or illicit drug use; etoh: 1 glass/month  Past Medical History:  Past Medical History:  Diagnosis Date   Allergy 1980's   Sulfa drugs and penecillin   Anxiety    Cataract 2021   Corrected   Depression    Headache    SINUS   Hyperlipidemia    Hypertension    Osteopenia    Osteopenia    Primary localized osteoarthritis of right knee     Past Surgical History:  Procedure Laterality Date   BREAST EXCISIONAL BIOPSY Right    benign cyst removal    CARPAL TUNNEL RELEASE Right 2015   Ortho in [place]   CHOLECYSTECTOMY N/A 02/08/2022   Procedure: LAPAROSCOPIC CHOLECYSTECTOMY WITH INTRAOPERATIVE CHOLANGIOGRAM;  Surgeon: Romie Levee, MD;  Location: [facility];  Service: General;  Laterality: N/A;   EYE SURGERY  12/2019   cataracts   JOINT REPLACEMENT  09/2015   Right knee   LASIK     MENISCUS REPAIR Right 2011   Orthopedic in [facility]   TOTAL KNEE ARTHROPLASTY Right 09/20/2015   TOTAL KNEE ARTHROPLASTY Right 09/20/2015   Procedure: TOTAL KNEE ARTHROPLASTY;  Surgeon: Salvatore Marvel, MD;  Location: [facility] OR;  Service: Orthopedics;  Laterality: Right;   Trigger Thumb Right 2015   Done in [facility] at the same time as the carpal tunnel release   TUBAL LIGATION  1980    Family Psychiatric History:  Mothe: anxiety Maternal aunt: alcohol use, anorexia   Family History:  Family History  Problem Relation Age of Onset   Heart disease Mother    Arthritis Mother    Heart failure Mother    Heart attack Mother    Anxiety disorder Mother    Depression Mother    Hearing loss Mother    Varicose Veins Mother    Early death Father    Heart attack Father    Hearing loss Father    Heart disease Father    Hyperlipidemia Sister    Heart disease Sister    Hearing loss Sister    Arthritis Sister    Irregular heart beat Sister    Hypertension Sister    Hypertension Brother    Hyperlipidemia Brother    Heart attack Brother     Asthma Brother    Alcohol abuse Brother    Heart disease Brother    Early death Brother    Heart attack Brother    Depression Son    Alcohol abuse Maternal Aunt     Social History:  Social History   Socioeconomic History   Marital status: Married    Spouse name: Lawrence   Number of children: 1   Years of education: 17.5   Highest education level: Master's degree (e.g., MA, MS, MEng, MEd, MSW, MBA)  Occupational History   Occupation: retired    Comment: Comptroller  Tobacco Use   Smoking status: Never   Smokeless tobacco: Never  Vaping Use   Vaping status: Never Used  Substance and Sexual Activity   Alcohol use: Not Currently    Comment: 1 glass a month   Drug use: Yes    Types: Benzodiazepines   Sexual activity: Yes    Birth control/protection: Post-menopausal, None  Other Topics Concern   Not on file  Social History Narrative   Reads paper, walks, housework errands. Caffeine use daily.  3 grands   Social Determinants of Health   Financial Resource Strain: Low Risk  (08/04/2022)   Overall Financial Resource Strain (CARDIA)    Difficulty of Paying Living Expenses: Not hard at all  Food Insecurity: No Food Insecurity (08/04/2022)   Hunger Vital Sign    Worried About Running Out of Food in the Last Year: Never true    Ran Out of Food in the Last Year: Never true  Transportation Needs: No Transportation Needs (08/04/2022)   PRAPARE - Administrator, Civil Service (Medical): No    Lack of Transportation (Non-Medical): No  Physical Activity: Insufficiently Active (08/04/2022)   Exercise Vital Sign    Days of Exercise per Week: 1 day    Minutes of Exercise per Session: 20 min  Stress: Stress Concern Present (08/04/2022)   [employer] of Occupational Health - Occupational Stress Questionnaire    Feeling of Stress : Rather much  Social Connections: Moderately Integrated (08/04/2022)   Social Connection and Isolation Panel [NHANES]    Frequency of  Communication with Friends and Family: Twice a week    Frequency of Social Gatherings with Friends and Family: Once a week    Attends Religious Services: Never    Database administrator or Organizations: Yes    Attends Engineer, structural: More than 4 times per year    Marital Status: Married  Recent Concern: Social Connections - Moderately Isolated (05/11/2022)   Social Connection and Isolation Panel [NHANES]    Frequency of Communication with Friends and Family: Three times a week    Frequency of Social Gatherings with Friends and Family: Twice a week    Attends Religious Services: Never    Database administrator or Organizations: No    Attends Engineer, structural: Not on file    Marital Status: Married    Allergies:  Allergies  Allergen Reactions   Penicillins Hives    Tolerates Cephalosporins    Prednisone Other (See Comments)    Hyperactivity, sleep disturbance, confusion   Sulfa Antibiotics Hives         Current Medications: Current Outpatient Medications  Medication Sig Dispense Refill   amLODipine-benazepril (LOTREL) 5-20 MG capsule Take 1 capsule by mouth daily. 90 capsule 3   atorvastatin (LIPITOR) 20 MG tablet Take 1 tablet (20 mg total) by mouth daily. 90 tablet 3   CALCIUM PO Take 1,200 mg by mouth daily.     fluticasone (FLONASE) 50 MCG/ACT nasal spray Place 1 spray into both nostrils daily as needed for allergies or rhinitis.     lidocaine (XYLOCAINE) 2 % solution Use as directed 10 mLs in the mouth or throat every 6 (six) hours as needed for mouth pain. 100 mL 3   mirtazapine (REMERON) 7.5 MG tablet Take 0.5-1 tablets (3.75-7.5 mg total) by mouth at bedtime as needed (sleep). 30 tablet 2   Multiple Vitamin (MULTIVITAMIN ADULT PO) Take 1 tablet by mouth daily.     raloxifene (EVISTA) 60 MG tablet Take 1 tablet (60 mg total) by mouth daily. 90 tablet 3   tirzepatide (ZEPBOUND) 5 MG/0.5ML Pen Inject 5 mg into the skin once a week. 2 mL 1    triamterene-hydrochlorothiazide (DYAZIDE) 37.5-25 MG capsule TAKE ONE CAPSULE BY MOUTH ONE TIME A DAY 90 capsule 3   venlafaxine XR (EFFEXOR-XR) 150 MG 24 hr capsule Take 1 capsule (150 mg total) by mouth daily with breakfast. 90 capsule 1   [START ON 12/05/2022] zolpidem (AMBIEN)  5 MG tablet Take 1.5 tablets (7.5 mg total) by mouth at bedtime as needed for sleep. 45 tablet 2   No current facility-administered medications for this visit.    ROS: Reports intentional weight loss  Objective:  Psychiatric Specialty Exam: There were no vitals taken for this visit.There is no height or weight on file to calculate BMI.  General Appearance: Casual, Neat, and Well Groomed  Eye Contact:  Good  Speech:  Clear and Coherent and Normal Rate  Volume:  Normal  Mood:   "pretty good"  Affect:   Euthymic; calm  Thought Content:  Denies AVH; IOR; paranoia    Suicidal Thoughts:  No  Homicidal Thoughts:  No  Thought Process:  Goal Directed and Linear  Orientation:  Full (Time, Place, and Person)    Memory:   Grossly intact  Judgment:  Good  Insight:  Good  Concentration:  Concentration: Good  Recall:  NA  Fund of Knowledge: Good  Language: Good  Psychomotor Activity:  Normal  Akathisia:  No  AIMS (if indicated): not done  Assets:  Communication Skills Desire for Improvement Financial Resources/Insurance Housing Intimacy Leisure Time Physical Health Resilience Social Support Talents/Skills Transportation  ADL's:  Intact  Cognition: WNL  Sleep:   fair however with chronic insomnia   PE: General: sits comfortably in view of camera; no acute distress  Pulm: no increased work of breathing on room air  MSK: all extremity movements appear intact  Neuro: no focal neurological deficits observed  Gait & Station: unable to assess by video   Metabolic Disorder Labs: Lab Results  Component Value Date   HGBA1C 6.0 04/17/2022   MPG 131 01/17/2019   MPG 128 11/28/2017   No results found  for: "PROLACTIN" Lab Results  Component Value Date   CHOL 172 04/17/2022   TRIG 123.0 04/17/2022   HDL 69.80 04/17/2022   CHOLHDL 2 04/17/2022   VLDL 24.6 04/17/2022   LDLCALC 77 04/17/2022   LDLCALC 94 06/30/2021   Lab Results  Component Value Date   TSH 3.07 04/17/2022   TSH 4.01 01/08/2017    Therapeutic Level Labs: No results found for: "LITHIUM" No results found for: "VALPROATE" No results found for: "CBMZ"  Screenings:  GAD-7    Flowsheet Row Office Visit from 11/15/2022 in [facility] from 09/27/2022 in [facility] from 06/09/2022 in [facility] from 04/17/2022 in [facility] Video Visit from 03/17/2022 in [facility]  Total GAD-7 Score 7 8 16 18 9       PHQ2-9    Flowsheet Row Office Visit from 11/15/2022 in [facility] Office Visit from 09/27/2022 in [facility] Clinical Support from 08/08/2022 in [facility] from 06/09/2022 in [facility] from 04/17/2022 in [facility]  PHQ-2 Total Score 2 2 2 2 4   PHQ-9 Total Score 5 10 6 11 15       Flowsheet Row ED to Hosp-Admission (Discharged) from 02/05/2022 in [facility] ED from 12/25/2021 in [facility] Video Visit from 10/19/2021 in [facility]  C-SSRS RISK CATEGORY No Risk No Risk Error: Q7 should not be populated when Q6 is No       Collaboration of Care: Collaboration of Care: Medication Management AEB ongoing medication management and Psychiatrist AEB established  with this provider  Patient/Guardian was advised Release of Information must be obtained prior to any record release in order to collaborate their care with an outside  provider. Patient/Guardian was advised if they have not already done so to contact the registration department to sign all necessary forms in order for [place] to release information regarding their care.   Consent: Patient/Guardian gives verbal consent for treatment and assignment of benefits for services provided during this visit. Patient/Guardian expressed understanding and agreed to proceed.   Virtual Visit via Video Note  I connected with [name] on 11/29/22 at 10:30 AM EDT by a video enabled telemedicine application and verified that I am speaking with the correct person using two identifiers.  Location: Patient: home address in [place] Provider: remote office in    I discussed the limitations of evaluation and management by telemedicine and the availability of in person appointments. The patient expressed understanding and agreed to proceed.   I discussed the assessment and treatment plan with the patient. The patient was provided an opportunity to ask questions and all were answered. The patient agreed with the plan and demonstrated an understanding of the instructions.   The patient was advised to call back or seek an in-person evaluation if the symptoms worsen or if the condition fails to improve as anticipated.  I provided 30 minutes dedicated to the care of this patient via video on the date of this encounter to include chart review, face-to-face time with the patient, medication management/counseling, and documentation.  Sharene Krikorian A  11/29/2022, 11:50 AM

## 2022-11-29 ENCOUNTER — Other Ambulatory Visit (HOSPITAL_COMMUNITY): Payer: Self-pay

## 2022-11-29 ENCOUNTER — Telehealth (INDEPENDENT_AMBULATORY_CARE_PROVIDER_SITE_OTHER): Payer: Medicare Other | Admitting: Psychiatry

## 2022-11-29 ENCOUNTER — Encounter (HOSPITAL_COMMUNITY): Payer: Self-pay | Admitting: Psychiatry

## 2022-11-29 DIAGNOSIS — F5101 Primary insomnia: Secondary | ICD-10-CM

## 2022-11-29 DIAGNOSIS — F3340 Major depressive disorder, recurrent, in remission, unspecified: Secondary | ICD-10-CM

## 2022-11-29 DIAGNOSIS — F411 Generalized anxiety disorder: Secondary | ICD-10-CM

## 2022-11-29 MED ORDER — VENLAFAXINE HCL ER 150 MG PO CP24
150.0000 mg | ORAL_CAPSULE | Freq: Every day | ORAL | 1 refills | Status: DC
  Filled 2022-11-29: qty 90, 90d supply, fill #0
  Filled 2023-03-05: qty 90, 90d supply, fill #1

## 2022-11-29 MED ORDER — MIRTAZAPINE 7.5 MG PO TABS
3.7500 mg | ORAL_TABLET | Freq: Every evening | ORAL | 2 refills | Status: DC | PRN
  Filled 2022-11-29: qty 30, 30d supply, fill #0
  Filled 2023-02-05: qty 30, 30d supply, fill #1

## 2022-11-29 MED ORDER — ZOLPIDEM TARTRATE 5 MG PO TABS
7.5000 mg | ORAL_TABLET | Freq: Every evening | ORAL | 2 refills | Status: DC | PRN
Start: 2022-12-05 — End: 2023-02-21
  Filled 2022-11-29 – 2022-12-13 (×3): qty 45, 30d supply, fill #0
  Filled 2023-02-05: qty 45, 30d supply, fill #1

## 2022-11-29 NOTE — Patient Instructions (Signed)
Thank you for attending your appointment today.  -- DECREASE mirtazapine to 3.75-7.5 mg nightly as needed for insomnia -- Continue other medications as prescribed.  Please do not make any changes to medications without first discussing with your provider. If you are experiencing a psychiatric emergency, please call 911 or present to your nearest emergency department. Additional crisis, medication management, and therapy resources are included below.  [facility]  [address] [place], [place] [address] [phone] WALK-IN URGENT CARE 24/7 FOR ANYONE [address], [place], [place]  [phone] Fax: [phone] [email or web address] *Interpreters available *Accepts all insurance and uninsured for Urgent Care needs *Accepts Medicaid and uninsured for outpatient treatment (below)      ONLY FOR [facility]  Below:    Outpatient New Patient Assessment/Therapy Walk-ins:        Monday -Thursday 8am until slots are full.        Every Friday 1pm-4pm  (first come, first served)                   New Patient Psychiatry/Medication Management        Monday-Friday 8am-11am (first come, first served)               For all walk-ins we ask that you arrive by 7:15am, because patients will be seen in the order of arrival.

## 2022-11-30 ENCOUNTER — Other Ambulatory Visit (HOSPITAL_COMMUNITY): Payer: Self-pay

## 2022-12-01 ENCOUNTER — Other Ambulatory Visit: Payer: Self-pay

## 2022-12-01 ENCOUNTER — Other Ambulatory Visit (HOSPITAL_COMMUNITY): Payer: Self-pay

## 2022-12-04 ENCOUNTER — Encounter (HOSPITAL_COMMUNITY): Payer: Self-pay

## 2022-12-04 ENCOUNTER — Other Ambulatory Visit (HOSPITAL_COMMUNITY): Payer: Self-pay

## 2022-12-04 ENCOUNTER — Ambulatory Visit (HOSPITAL_COMMUNITY)
Admission: RE | Admit: 2022-12-04 | Discharge: 2022-12-04 | Disposition: A | Discharge status: Home / Self Care | Payer: Medicare Other | Source: Ambulatory Visit | Attending: Internal Medicine | Admitting: Internal Medicine

## 2022-12-04 VITALS — BP 128/72 | HR 107 | Temp 97.9°F | Resp 20

## 2022-12-04 DIAGNOSIS — J019 Acute sinusitis, unspecified: Secondary | ICD-10-CM

## 2022-12-04 MED ORDER — CEFDINIR 300 MG PO CAPS
300.0000 mg | ORAL_CAPSULE | Freq: Two times a day (BID) | ORAL | 0 refills | Status: AC
  Filled 2022-12-04: qty 14, 7d supply, fill #0

## 2022-12-04 NOTE — ED Triage Notes (Signed)
Pt c/o chills, head pressure, and post nasal drip x10 days. States took a cold capsule with no relief.

## 2022-12-04 NOTE — ED Provider Notes (Signed)
[facility]    CSN: [id number] Arrival date & time: 12/04/22  1253      History   Chief Complaint Chief Complaint  Patient presents with   Nasal Congestion    Possible sinus or respiratory infection.  Blocked ears, headache, chills.  Duration of about 10 days.  No fever. - Entered by patient    HPI [name] is a 77 y.o. female comes to the urgent care with a 10-day history of nasal congestion, facial pain and postnasal drainage.  Patient symptoms started insidiously and has been worsening.  No fever but patient endorses chills.  She has postnasal drainage and cough productive of yellowish sputum.  She denies any shortness of breath, chest pain or chest pressure.  No wheezing or chest tightness.  Patient endorses ear fullness but denies any hearing difficulties, ringing in the ears or dizziness..  Patient has used Flonase with no significant improvement.  HPI  Past Medical History:  Diagnosis Date   Allergy 1980's   Sulfa drugs and penecillin   Anxiety    Cataract 2021   Corrected   Depression    Headache    SINUS   Hyperlipidemia    Hypertension    Osteopenia    Osteopenia    Primary localized osteoarthritis of right knee     Patient Active Problem List   Diagnosis Date Noted   Primary osteoarthritis involving multiple joints 06/11/2022   Acute gallstone pancreatitis 02/05/2022   Aortic atherosclerosis (HCC) 02/05/2022   Facial lesion 09/30/2021   Bilateral hand pain 09/30/2021   Impingement syndrome, shoulder, left 02/16/2021   Impingement syndrome, shoulder, right 09/07/2020   MDD (recurrent major depressive disorder) in remission (HCC) 09/02/2019   Generalized anxiety disorder 09/02/2019   Primary insomnia 08/16/2019   Post-menopausal 05/14/2017   Prediabetes 01/12/2017   Snoring 07/20/2016   Obsessive-compulsive personality trait 12/07/2015   Primary localized osteoarthritis of right knee    Essential hypertension 08/04/2015    Hyperlipidemia 08/04/2015   Osteopenia 08/04/2015   Anxiety associated with depression 08/04/2015    Past Surgical History:  Procedure Laterality Date   BREAST EXCISIONAL BIOPSY Right    benign cyst removal    CARPAL TUNNEL RELEASE Right 2015   Ortho in [place]   CHOLECYSTECTOMY N/A 02/08/2022   Procedure: LAPAROSCOPIC CHOLECYSTECTOMY WITH INTRAOPERATIVE CHOLANGIOGRAM;  Surgeon: Romie Levee, MD;  Location: [facility];  Service: General;  Laterality: N/A;   EYE SURGERY  12/2019   cataracts   JOINT REPLACEMENT  09/2015   Right knee   LASIK     MENISCUS REPAIR Right 2011   Orthopedic in [facility]   TOTAL KNEE ARTHROPLASTY Right 09/20/2015   TOTAL KNEE ARTHROPLASTY Right 09/20/2015   Procedure: TOTAL KNEE ARTHROPLASTY;  Surgeon: Salvatore Marvel, MD;  Location: [facility] OR;  Service: Orthopedics;  Laterality: Right;   Trigger Thumb Right 2015   Done in [place] at the same time as the carpal tunnel release   TUBAL LIGATION  1980    OB History   No obstetric history on file.      Home Medications    Prior to Admission medications   Medication Sig Start Date End Date Taking? Authorizing Provider  cefdinir (OMNICEF) 300 MG capsule Take 1 capsule (300 mg total) by mouth 2 (two) times daily for 7 days. 12/04/22 12/11/22 Yes Shavelle Runkel, Britta Mccreedy, MD  amLODipine-benazepril (LOTREL) 5-20 MG capsule Take 1 capsule by mouth daily. 06/09/22   Jeani Sow, MD  atorvastatin (LIPITOR) 20  MG tablet Take 1 tablet (20 mg total) by mouth daily. 06/09/22   Jeani Sow, MD  CALCIUM PO Take 1,200 mg by mouth daily.    [provider]  fluticasone (FLONASE) 50 MCG/ACT nasal spray Place 1 spray into both nostrils daily as needed for allergies or rhinitis.    [provider]  lidocaine (XYLOCAINE) 2 % solution Use as directed 10 mLs in the mouth or throat every 6 (six) hours as needed for mouth pain. 05/15/22   Jeani Sow, MD  mirtazapine (REMERON) 7.5 MG tablet Take 0.5-1 tablets  (3.75-7.5 mg total) by mouth at bedtime as needed (sleep). 11/29/22 02/27/23  Bahraini, Sarah A  Multiple Vitamin (MULTIVITAMIN ADULT PO) Take 1 tablet by mouth daily.    [provider]  raloxifene (EVISTA) 60 MG tablet Take 1 tablet (60 mg total) by mouth daily. 06/09/22   Jeani Sow, MD  tirzepatide [facility]) 5 MG/0.5ML Pen Inject 5 mg into the skin once a week. 11/08/22   Jeani Sow, MD  triamterene-hydrochlorothiazide (DYAZIDE) 37.5-25 MG capsule TAKE ONE CAPSULE BY MOUTH ONE TIME A DAY 06/09/22   Jeani Sow, MD  venlafaxine XR [facility]) 150 MG 24 hr capsule Take 1 capsule (150 mg total) by mouth daily with breakfast. 11/29/22 05/28/23  Bahraini, Sarah A  zolpidem (AMBIEN) 5 MG tablet Take 1.5 tablets (7.5 mg total) by mouth at bedtime as needed for sleep. 12/05/22 03/05/23  Daine Gip    Family History Family History  Problem Relation Age of Onset   Heart disease Mother    Arthritis Mother    Heart failure Mother    Heart attack Mother    Anxiety disorder Mother    Depression Mother    Hearing loss Mother    Varicose Veins Mother    Early death Father    Heart attack Father    Hearing loss Father    Heart disease Father    Hyperlipidemia Sister    Heart disease Sister    Hearing loss Sister    Arthritis Sister    Irregular heart beat Sister    Hypertension Sister    Hypertension Brother    Hyperlipidemia Brother    Heart attack Brother    Asthma Brother    Alcohol abuse Brother    Heart disease Brother    Early death Brother    Heart attack Brother    Depression Son    Alcohol abuse Maternal Aunt     Social History Social History   Tobacco Use   Smoking status: Never   Smokeless tobacco: Never  Vaping Use   Vaping status: Never Used  Substance Use Topics   Alcohol use: Not Currently    Comment: 1 glass a month   Drug use: Yes    Types: Benzodiazepines     Allergies   Penicillins, Prednisone, and Sulfa  antibiotics   Review of Systems Review of Systems As per HPI  Physical Exam Triage Vital Signs ED Triage Vitals  Encounter Vitals Group     BP 12/04/22 1327 128/72     Systolic BP Percentile --      Diastolic BP Percentile --      Pulse Rate 12/04/22 1327 (!) 107     Resp 12/04/22 1327 20     Temp 12/04/22 1327 97.9 F (36.6 C)     Temp Source 12/04/22 1327 Oral     SpO2 12/04/22 1327 93 %     Weight --  Height --      Head Circumference --      Peak Flow --      Pain Score 12/04/22 1328 0     Pain Loc --      Pain Education --      Exclude from Growth Chart --    No data found.  Updated Vital Signs BP 128/72 (BP Location: Left Arm)   Pulse (!) 107   Temp 97.9 F (36.6 C) (Oral)   Resp 20   SpO2 93%   Visual Acuity Right Eye Distance:   Left Eye Distance:   Bilateral Distance:    Right Eye Near:   Left Eye Near:    Bilateral Near:     Physical Exam Vitals and nursing note reviewed.  Constitutional:      General: She is not in acute distress.    Appearance: She is ill-appearing.  HENT:     Right Ear: Tympanic membrane normal.     Left Ear: Tympanic membrane normal.     Nose: Congestion present.     Mouth/Throat:     Mouth: Mucous membranes are moist.     Pharynx: No oropharyngeal exudate or posterior oropharyngeal erythema.  Eyes:     Extraocular Movements: Extraocular movements intact.     Conjunctiva/sclera: Conjunctivae normal.  Cardiovascular:     Rate and Rhythm: Normal rate and regular rhythm.  Neurological:     Mental Status: She is alert.      UC Treatments / Results  Labs (all labs ordered are listed, but only abnormal results are displayed) Labs Reviewed - No data to display  EKG   Radiology No results found.  Procedures Procedures (including critical care time)  Medications Ordered in UC Medications - No data to display  Initial Impression / Assessment and Plan / UC Course  I have reviewed the triage vital signs  and the nursing notes.  Pertinent labs & imaging results that were available during my care of the patient were reviewed by me and considered in my medical decision making (see chart for details).     1.  Acute sinusitis with symptoms greater than 10 days: Cefdinir 300 mg twice daily for 7 days Humidifier use will help with nasal congestion and facial pain Patient is advised to continue using Flonase Over-the-counter cetirizine may help as well. Return precautions given. Final Clinical Impressions(s) / UC Diagnoses   Final diagnoses:  Acute sinusitis with symptoms greater than 10 days     Discharge Instructions      Please continue using Flonase as directed Humidifier use at bedtime will help with nasal congestion and postnasal drainage Please take antibiotics as prescribed.  Please complete the full course of antibiotics. If you have worsening symptoms please return to urgent care to be reevaluated.   ED Prescriptions     Medication Sig Dispense Auth. Provider   cefdinir (OMNICEF) 300 MG capsule Take 1 capsule (300 mg total) by mouth 2 (two) times daily for 7 days. 14 capsule Zarek Relph, Britta Mccreedy, MD      PDMP not reviewed this encounter.   Merrilee Jansky, MD 12/04/22 [phone]

## 2022-12-04 NOTE — Discharge Instructions (Addendum)
Please continue using Flonase as directed Humidifier use at bedtime will help with nasal congestion and postnasal drainage Please take antibiotics as prescribed.  Please complete the full course of antibiotics. If you have worsening symptoms please return to urgent care to be reevaluated.

## 2022-12-07 ENCOUNTER — Other Ambulatory Visit (HOSPITAL_COMMUNITY): Payer: Self-pay

## 2022-12-07 ENCOUNTER — Other Ambulatory Visit: Payer: Self-pay | Admitting: *Deleted

## 2022-12-07 ENCOUNTER — Encounter: Payer: Self-pay | Admitting: Family Medicine

## 2022-12-07 MED ORDER — ZEPBOUND 7.5 MG/0.5ML ~~LOC~~ SOAJ
7.5000 mg | SUBCUTANEOUS | 0 refills | Status: DC
  Filled 2022-12-07: qty 2, 28d supply, fill #0

## 2022-12-07 NOTE — Telephone Encounter (Signed)
Rx sent to the pharmacy. See message below.

## 2022-12-11 ENCOUNTER — Telehealth (HOSPITAL_COMMUNITY): Payer: Self-pay | Admitting: Psychiatry

## 2022-12-11 ENCOUNTER — Other Ambulatory Visit (HOSPITAL_COMMUNITY): Payer: Self-pay

## 2022-12-12 ENCOUNTER — Ambulatory Visit: Payer: Medicare Other | Admitting: Family Medicine

## 2022-12-12 NOTE — Progress Notes (Incomplete)
Subjective:    Patient ID: [name], female    DOB: [birth year], 77 y.o.   MRN: [id number]  No chief complaint on file.   HPI- {[id number]}  Sinusitis Sx - Reports that she is still experiencing persisting sinus symptoms despite treatment  *** - ***  *** - ***  *** - ***  *** - ***  *** - ***  Health Maintenance Due  Topic Date Due   DTaP/Tdap/Td (3 - Td or Tdap) 12/04/2021    Past Medical History:  Diagnosis Date   Allergy 1980's   Sulfa drugs and penecillin   Anxiety    Cataract 2021   Corrected   Depression    Headache    SINUS   Hyperlipidemia    Hypertension    Osteopenia    Osteopenia    Primary localized osteoarthritis of right knee     Past Surgical History:  Procedure Laterality Date   BREAST EXCISIONAL BIOPSY Right    benign cyst removal    CARPAL TUNNEL RELEASE Right 2015   Ortho in [place]   CHOLECYSTECTOMY N/A 02/08/2022   Procedure: LAPAROSCOPIC CHOLECYSTECTOMY WITH INTRAOPERATIVE CHOLANGIOGRAM;  Surgeon: Romie Levee, MD;  Location: [facility];  Service: General;  Laterality: N/A;   EYE SURGERY  12/2019   cataracts   JOINT REPLACEMENT  09/2015   Right knee   LASIK     MENISCUS REPAIR Right 2011   Orthopedic in [facility]   TOTAL KNEE ARTHROPLASTY Right 09/20/2015   TOTAL KNEE ARTHROPLASTY Right 09/20/2015   Procedure: TOTAL KNEE ARTHROPLASTY;  Surgeon: Salvatore Marvel, MD;  Location: [facility] OR;  Service: Orthopedics;  Laterality: Right;   Trigger Thumb Right 2015   Done in [facility] at the same time as the carpal tunnel release   TUBAL LIGATION  1980     Current Outpatient Medications:    tirzepatide (ZEPBOUND) 7.5 MG/0.5ML Pen, Inject 7.5 mg into the skin once a week., Disp: 2 mL, Rfl: 0   amLODipine-benazepril (LOTREL) 5-20 MG capsule, Take 1 capsule by mouth daily., Disp: 90 capsule, Rfl: 3   atorvastatin (LIPITOR) 20 MG tablet, Take 1 tablet (20 mg total) by mouth daily., Disp: 90 tablet, Rfl: 3   CALCIUM PO, Take 1,200 mg  by mouth daily., Disp: , Rfl:    fluticasone (FLONASE) 50 MCG/ACT nasal spray, Place 1 spray into both nostrils daily as needed for allergies or rhinitis., Disp: , Rfl:    lidocaine (XYLOCAINE) 2 % solution, Use as directed 10 mLs in the mouth or throat every 6 (six) hours as needed for mouth pain., Disp: 100 mL, Rfl: 3   mirtazapine (REMERON) 7.5 MG tablet, Take 0.5-1 tablets (3.75-7.5 mg total) by mouth at bedtime as needed (sleep)., Disp: 30 tablet, Rfl: 2   Multiple Vitamin (MULTIVITAMIN ADULT PO), Take 1 tablet by mouth daily., Disp: , Rfl:    raloxifene (EVISTA) 60 MG tablet, Take 1 tablet (60 mg total) by mouth daily., Disp: 90 tablet, Rfl: 3   triamterene-hydrochlorothiazide (DYAZIDE) 37.5-25 MG capsule, TAKE ONE CAPSULE BY MOUTH ONE TIME A DAY, Disp: 90 capsule, Rfl: 3   venlafaxine XR (EFFEXOR-XR) 150 MG 24 hr capsule, Take 1 capsule (150 mg total) by mouth daily with breakfast., Disp: 90 capsule, Rfl: 1   zolpidem (AMBIEN) 5 MG tablet, Take 1.5 tablets (7.5 mg total) by mouth at bedtime as needed for sleep., Disp: 45 tablet, Rfl: 2  Allergies  Allergen Reactions   Penicillins Hives    Tolerates Cephalosporins  Prednisone Other (See Comments)    Hyperactivity, sleep disturbance, confusion   Sulfa Antibiotics Hives        ROS neg/noncontributory except as noted HPI/below  Objective:  There were no vitals taken for this visit. Wt Readings from Last 3 Encounters:  11/15/22 195 lb 8 oz (88.7 kg)  09/27/22 195 lb (88.5 kg)  08/08/22 188 lb 12.8 oz (85.6 kg)   Physical Exam   Gen: WDWN NAD HEENT: NCAT, conjunctiva not injected, sclera nonicteric NECK:  supple, no thyromegaly, no nodes, no carotid bruits CARDIAC: RRR, S1S2+, no murmur. DP 2+B LUNGS: CTAB. No wheezes ABDOMEN:  BS+, soft, NTND, No HSM, no masses EXT:  no edema MSK: no gross abnormalities.  NEURO: A&O x3.  CN II-XII intact.  PSYCH: normal mood. Good eye contact Assessment & Plan:  There are no diagnoses  linked to this encounter.  No follow-ups on file.         I,Emily Lagle,acting as a Neurosurgeon for Angelena Sole, MD.,have documented all relevant documentation on the behalf of Angelena Sole, MD,as directed by  Angelena Sole, MD while in the presence of Angelena Sole, MD.  I, Angelena Sole, MD, have reviewed all documentation for this visit. The documentation on 12/12/22 for the exam, diagnosis, procedures, and orders are all accurate and complete. *** (refresh reminder)  Larey Brick

## 2022-12-13 ENCOUNTER — Other Ambulatory Visit (HOSPITAL_COMMUNITY): Payer: Self-pay

## 2022-12-24 ENCOUNTER — Encounter: Payer: Self-pay | Admitting: Family Medicine

## 2022-12-25 ENCOUNTER — Other Ambulatory Visit: Payer: Self-pay

## 2022-12-25 ENCOUNTER — Telehealth: Payer: Self-pay | Admitting: Family Medicine

## 2022-12-25 ENCOUNTER — Emergency Department (HOSPITAL_COMMUNITY)
Admission: EM | Admit: 2022-12-25 | Discharge: 2022-12-25 | Disposition: A | Discharge status: Home / Self Care | Payer: Medicare Other | Attending: Emergency Medicine | Admitting: Emergency Medicine

## 2022-12-25 ENCOUNTER — Emergency Department (HOSPITAL_COMMUNITY): Payer: Medicare Other

## 2022-12-25 ENCOUNTER — Encounter (HOSPITAL_COMMUNITY): Payer: Self-pay

## 2022-12-25 DIAGNOSIS — Z79899 Other long term (current) drug therapy: Secondary | ICD-10-CM | POA: Insufficient documentation

## 2022-12-25 DIAGNOSIS — R5383 Other fatigue: Secondary | ICD-10-CM | POA: Insufficient documentation

## 2022-12-25 DIAGNOSIS — R0602 Shortness of breath: Secondary | ICD-10-CM | POA: Insufficient documentation

## 2022-12-25 DIAGNOSIS — R61 Generalized hyperhidrosis: Secondary | ICD-10-CM | POA: Insufficient documentation

## 2022-12-25 DIAGNOSIS — Z20822 Contact with and (suspected) exposure to covid-19: Secondary | ICD-10-CM | POA: Diagnosis not present

## 2022-12-25 DIAGNOSIS — I10 Essential (primary) hypertension: Secondary | ICD-10-CM | POA: Insufficient documentation

## 2022-12-25 DIAGNOSIS — R0609 Other forms of dyspnea: Secondary | ICD-10-CM

## 2022-12-25 DIAGNOSIS — R42 Dizziness and giddiness: Secondary | ICD-10-CM | POA: Diagnosis not present

## 2022-12-25 LAB — CBC WITH DIFFERENTIAL/PLATELET
Abs Immature Granulocytes: 0.03 10*3/uL (ref 0.00–0.07)
Basophils Absolute: 0.1 10*3/uL (ref 0.0–0.1)
Basophils Relative: 1 %
Eosinophils Absolute: 0.1 10*3/uL (ref 0.0–0.5)
Eosinophils Relative: 1 %
HCT: 38.6 % (ref 36.0–46.0)
Hemoglobin: 12.7 g/dL (ref 12.0–15.0)
Immature Granulocytes: 0 %
Lymphocytes Relative: 28 %
Lymphs Abs: 2.3 10*3/uL (ref 0.7–4.0)
MCH: 27.7 pg (ref 26.0–34.0)
MCHC: 32.9 g/dL (ref 30.0–36.0)
MCV: 84.1 fL (ref 80.0–100.0)
Monocytes Absolute: 0.5 10*3/uL (ref 0.1–1.0)
Monocytes Relative: 6 %
Neutro Abs: 5.2 10*3/uL (ref 1.7–7.7)
Neutrophils Relative %: 64 %
Platelets: 418 10*3/uL — ABNORMAL HIGH (ref 150–400)
RBC: 4.59 MIL/uL (ref 3.87–5.11)
RDW: 16.1 % — ABNORMAL HIGH (ref 11.5–15.5)
WBC: 8.1 10*3/uL (ref 4.0–10.5)
nRBC: 0 % (ref 0.0–0.2)

## 2022-12-25 LAB — COMPREHENSIVE METABOLIC PANEL
ALT: 20 U/L (ref 0–44)
AST: 28 U/L (ref 15–41)
Albumin: 4.2 g/dL (ref 3.5–5.0)
Alkaline Phosphatase: 80 U/L (ref 38–126)
Anion gap: 12 (ref 5–15)
BUN: 25 mg/dL — ABNORMAL HIGH (ref 8–23)
CO2: 21 mmol/L — ABNORMAL LOW (ref 22–32)
Calcium: 9.7 mg/dL (ref 8.9–10.3)
Chloride: 103 mmol/L (ref 98–111)
Creatinine, Ser: 1.45 mg/dL — ABNORMAL HIGH (ref 0.44–1.00)
GFR, Estimated: 37 mL/min — ABNORMAL LOW (ref >60)
Glucose, Bld: 112 mg/dL — ABNORMAL HIGH (ref 70–99)
Potassium: 3.8 mmol/L (ref 3.5–5.1)
Sodium: 136 mmol/L (ref 135–145)
Total Bilirubin: 0.7 mg/dL (ref <1.2)
Total Protein: 7.8 g/dL (ref 6.5–8.1)

## 2022-12-25 LAB — SARS CORONAVIRUS 2 BY RT PCR: SARS Coronavirus 2 by RT PCR: NEGATIVE

## 2022-12-25 LAB — D-DIMER, QUANTITATIVE: D-Dimer, Quant: 0.36 ug{FEU}/mL (ref 0.00–0.50)

## 2022-12-25 LAB — TROPONIN I (HIGH SENSITIVITY)
Troponin I (High Sensitivity): 5 ng/L (ref <18)
Troponin I (High Sensitivity): 6 ng/L (ref <18)

## 2022-12-25 LAB — CBG MONITORING, ED: Glucose-Capillary: 124 mg/dL — ABNORMAL HIGH (ref 70–99)

## 2022-12-25 LAB — BRAIN NATRIURETIC PEPTIDE: B Natriuretic Peptide: 76.9 pg/mL (ref 0.0–100.0)

## 2022-12-25 NOTE — ED Provider Notes (Signed)
Received patient in turnover from Dr. Jearld Fenton.  Please see their note for further details of Hx, PE.  Briefly patient is a 77 y.o. female with a Dizziness, Weakness, and Shortness of Breath .  Going on for months.  Workup here thus far unremarkable.  Plan for d/c with cards follow up.  Chest x-ray independently interpreted by me without focal true pneumothorax.  Troponin negative.  No significant electrolyte abnormality.    Melene Plan, DO 12/25/22 [phone]

## 2022-12-25 NOTE — ED Provider Notes (Signed)
[place] EMERGENCY DEPARTMENT AT [facility] Provider Note   CSN: [id number] Arrival date & time: 12/25/22  1106     History  Chief Complaint  Patient presents with   Dizziness   Weakness   Shortness of Breath    [name] is a 77 y.o. female with PMH as listed below who presents with dizziness, shortness of breath, diaphoresis with any exertion. Reports this has been going on for months, but has become worse over the last several weeks and days.  She does not have any chest pain, nausea, vomiting with the symptoms.  She does not feel short of breath or dizzy at rest.  She also has had significant fatigue with any exertion.  She notes a cardiac history in her father side of the family including heart attacks and heart failure but she does not have any personal history of cardiac disease herself and has never seen a cardiologist.  She has not had any fevers chills, cough, abdominal pain, chest or back pain, leg swelling.  No history of DVT or PE.  No history of recent travel, hospitalizations or surgeries.  Does not take any hormones.  Past Medical History:  Diagnosis Date   Allergy 1980's   Sulfa drugs and penecillin   Anxiety    Cataract 2021   Corrected   Depression    Headache    SINUS   Hyperlipidemia    Hypertension    Osteopenia    Osteopenia    Primary localized osteoarthritis of right knee        Home Medications Prior to Admission medications   Medication Sig Start Date End Date Taking? Authorizing Provider  tirzepatide (ZEPBOUND) 7.5 MG/0.5ML Pen Inject 7.5 mg into the skin once a week. 12/07/22   Jeani Sow, MD  amLODipine-benazepril (LOTREL) 5-20 MG capsule Take 1 capsule by mouth daily. 06/09/22   Jeani Sow, MD  atorvastatin (LIPITOR) 20 MG tablet Take 1 tablet (20 mg total) by mouth daily. 06/09/22   Jeani Sow, MD  CALCIUM PO Take 1,200 mg by mouth daily.    [provider]  fluticasone (FLONASE) 50 MCG/ACT  nasal spray Place 1 spray into both nostrils daily as needed for allergies or rhinitis.    [provider]  lidocaine (XYLOCAINE) 2 % solution Use as directed 10 mLs in the mouth or throat every 6 (six) hours as needed for mouth pain. 05/15/22   Jeani Sow, MD  mirtazapine (REMERON) 7.5 MG tablet Take 0.5-1 tablets (3.75-7.5 mg total) by mouth at bedtime as needed (sleep). 11/29/22 02/27/23  Bahraini, Sarah A  Multiple Vitamin (MULTIVITAMIN ADULT PO) Take 1 tablet by mouth daily.    [provider]  raloxifene (EVISTA) 60 MG tablet Take 1 tablet (60 mg total) by mouth daily. 06/09/22   Jeani Sow, MD  triamterene-hydrochlorothiazide (DYAZIDE) 37.5-25 MG capsule TAKE ONE CAPSULE BY MOUTH ONE TIME A DAY 06/09/22   Jeani Sow, MD  venlafaxine XR [facility]) 150 MG 24 hr capsule Take 1 capsule (150 mg total) by mouth daily with breakfast. 11/29/22 05/28/23  Bahraini, Sarah A  zolpidem (AMBIEN) 5 MG tablet Take 1.5 tablets (7.5 mg total) by mouth at bedtime as needed for sleep. 12/05/22 03/05/23  Bahraini, Sarah A      Allergies    Penicillins, Prednisone, and Sulfa antibiotics    Review of Systems   Review of Systems A 10 point review of systems was performed and is negative  unless otherwise reported in HPI.  Physical Exam Updated Vital Signs BP 129/64   Pulse 93   Temp 97.9 F (36.6 C) (Oral)   Resp 18   Ht 5\' 2"  (1.575 m)   Wt 88.7 kg   SpO2 97%   BMI 35.77 kg/m  Physical Exam General: Normal appearing female, lying in bed.  HEENT:  Sclera anicteric, MMM, trachea midline.  Cardiology: RRR, no murmurs/rubs/gallops. BL radial and DP pulses equal bilaterally.  Resp: Normal respiratory rate and effort. CTAB, no wheezes, rhonchi, crackles.  Abd: Soft, non-tender, non-distended. No rebound tenderness or guarding.  GU: Deferred. MSK: No peripheral edema or signs of trauma.  Skin: warm, dry.  Neuro: A&Ox4, CNs II-XII grossly intact. MAEs. Sensation grossly  intact.  Psych: Normal mood and affect.   ED Results / Procedures / Treatments   Labs (all labs ordered are listed, but only abnormal results are displayed) Labs Reviewed  CBG MONITORING, ED - Abnormal; Notable for the following components:      Result Value   Glucose-Capillary 124 (*)    All other components within normal limits  SARS CORONAVIRUS 2 BY RT PCR  CBC WITH DIFFERENTIAL/PLATELET  COMPREHENSIVE METABOLIC PANEL  BRAIN NATRIURETIC PEPTIDE  D-DIMER, QUANTITATIVE  TROPONIN I (HIGH SENSITIVITY)    EKG EKG Interpretation Date/Time:  Monday December 25 2022 11:14:28 EST Ventricular Rate:  93 PR Interval:  170 QRS Duration:  88 QT Interval:  354 QTC Calculation: 441 R Axis:   120  Text Interpretation: Sinus rhythm Anteroseptal infarct, age indeterminate Confirmed by Vivi Barrack [phone]) on 12/25/2022 11:20:00 AM  Radiology No results found.  Procedures Procedures    Medications Ordered in ED Medications - No data to display  ED Course/ Medical Decision Making/ A&P                          Medical Decision Making Amount and/or Complexity of Data Reviewed Labs: ordered. Decision-making details documented in ED Course. Radiology: ordered.    This patient presents to the ED for concern of dizziness/DOE, this involves an extensive number of treatment options, and is a complaint that carries with it a high risk of complications and morbidity.  I considered the following differential and admission for this acute, potentially life threatening condition.   MDM:    DDX for dyspnea includes but is not limited to:  CHF, Myocardial Ischemia, Valvular heart disease, Arrhythmia.  Patient has no sign of arrhythmia or ischemia on her EKG.  Her cardiopulmonary exam is unrevealing and she has no murmurs indicate valvular heart disease.  Possible that she is experiencing new heart failure and will get a BNP but she would likely benefit from outpatient echocardiogram.  Chest  x-ray does not demonstrate any pneumonia, pleural effusion, or PTX.  She has no wheezing to indicate COPD.  She cannot PERC out due to age for PE, but no unilateral leg swelling or risk factors for PE, D-dimer negative.  CBC with no anemia.   Clinical Course as of 12/29/22 0943  Mon Dec 25, 2022  1502 WBC: 8.1 No leukocytosis  [HN]  1502 Hemoglobin: 12.7 No anemia [HN]  1502 D-Dimer, Quant: 0.36 Neg dimer [HN]    Clinical Course User Index [HN] Loetta Rough, MD    Labs: I Ordered, and personally interpreted labs.  The pertinent results include: Those listed above  Imaging Studies ordered: I ordered imaging studies including chest x-ray I independently visualized and interpreted imaging.  I agree with the radiologist interpretation  Additional history obtained from chart review  Cardiac Monitoring: The patient was maintained on a cardiac monitor.  I personally viewed and interpreted the cardiac monitored which showed an underlying rhythm of: Normal sinus rhythm  Reevaluation: After the interventions noted above, I reevaluated the patient and found that they have :stayed the same  Social Determinants of Health:  lives independently with husband  Disposition:  Patient is signed out to the oncoming ED physician Dr. Adela Lank, who is made aware of her history, presentation, exam, workup, and plan. Plan is to complete remainder of w/u including CXR and likely DC w/ cardiology f/u. Already referred to cardiology.    Co morbidities that complicate the patient evaluation  Past Medical History:  Diagnosis Date   Allergy 1980's   Sulfa drugs and penecillin   Anxiety    Cataract 2021   Corrected   Depression    Headache    SINUS   Hyperlipidemia    Hypertension    Osteopenia    Osteopenia    Primary localized osteoarthritis of right knee      Medicines No orders of the defined types were placed in this encounter.   I have reviewed the patients home medicines and have  made adjustments as needed  Problem List / ED Course: Problem List Items Addressed This Visit   None Visit Diagnoses     Dyspnea on exertion    -  Primary   Relevant Orders   Ambulatory referral to Cardiology   Other fatigue       Dizziness                       This note was created using dictation software, which may contain spelling or grammatical errors.    Loetta Rough, MD 12/29/22 [phone]

## 2022-12-25 NOTE — Discharge Instructions (Addendum)
Thank you for coming to [facility] Emergency Department. You were seen for shortness of breath with exertion as well as dizziness and fatigue. We did an exam, labs, and imaging, and these showed no acute findings.  We have provided a referral to a cardiologist who will call you to make an appointment.  Please follow-up with the cardiologist for further testing. Please follow up with your primary care provider within 1 week.   Do not hesitate to return to the ED or call 911 if you experience: -Worsening symptoms -Chest pain -Shortness of breath at rest -Lightheadedness, passing out -Fevers/chills -Anything else that concerns you

## 2022-12-25 NOTE — Telephone Encounter (Signed)
Patient Advised Go to ED now  Patient Name First: Emma Last: [name] Gender: Female DOB: 1945-06-18 Age: 77 Y 1 M 25 D Return Phone Number: [phone] (Primary) Address: City/ State/ Zip: [place]  [address] Client [facility] - Administrator, [facility] at [facility] Provider Ruthine Dose, Ann Contact Type Call Who Is Calling Patient / Member / Family / Caregiver Call Type Triage / Clinical Relationship To Patient Self Return Phone Number [phone] (Primary) Chief Complaint BREATHING - shortness of breath or sounds breathless Reason for Call Symptomatic / Request for Health Information Initial Comment Caller states she is having shortness breath, irregular heart beat. Additional Comment transferred from office Translation No Nurse Assessment Nurse: Carylon Perches, RN, Hilda Lias Date/Time Lamount Cohen Time): 12/25/2022 10:20:19 AM Confirm and document reason for call. If symptomatic, describe symptoms. ---Caller states she is having tachycardia and she is sweating. She is getting short of breath when it happens. It started a couple weeks ago and she has tried to ignore it. Denies chest pain. Does the patient have any new or worsening symptoms? ---Yes Will a triage be completed? ---Yes Related visit to physician within the last 2 weeks? ---Yes Does the PT have any chronic conditions? (i.e. diabetes, asthma, this includes High risk factors for pregnancy, etc.) ---Yes List chronic conditions. ---depression, hyperlipidemia Is this a behavioral health or substance abuse call? ---No Guidelines Guideline Title Affirmed Question Affirmed Notes Nurse Date/Time (Eastern Time) Heart Rate and Heartbeat Questions New or worsened shortness of breath with activity (dyspnea on exertion) Carylon Perches, RN, Hilda Lias 12/25/2022 10:23:30 AM  Disp. Time Lamount Cohen Time) Disposition Final User 12/25/2022 10:18:06 AM Send to Urgent Queue Pearletha Furl 12/25/2022 10:28:56 AM Go to ED Now (or PCP triage) Yes Carylon Perches, RN, Hilda Lias Final Disposition 12/25/2022 10:28:56 AM Go to ED Now (or PCP triage) Yes Carylon Perches, RN, Seward Grater Disagree/Comply Comply Caller Understands Yes PreDisposition Did not know what to do Care Advice Given Per Guideline GO TO ED/UCC NOW (OR PCP TRIAGE): * IF NO PCP (PRIMARY CARE PROVIDER) SECOND-LEVEL TRIAGE: You need to be seen within the next hour. Go to the ED/UCC at _____________ Hospital. Leave as soon as you can. CARE ADVICE given per Heart Rate and Heartbeat Questions (Adult) guideline. Referrals [facility] - ED

## 2022-12-25 NOTE — Telephone Encounter (Signed)
FYI: This call has been transferred to triage nurse: the Triage Nurse. Once the result note has been entered staff can address the message at that time.  Patient called in with the following symptoms:  Red Word: Rapid heart beat, shortness of breath, shaking, sweating   Please advise at Mobile [phone] (mobile)  Message is routed to Provider Pool.

## 2022-12-25 NOTE — Telephone Encounter (Signed)
Patient currently at ED

## 2022-12-25 NOTE — ED Triage Notes (Signed)
Patient is here for evaluation of getting dizzy, short of breath, diaphoretic with any exertion. Reports this has been going on for a little while, but has become much worse over the past few days.

## 2023-01-08 ENCOUNTER — Other Ambulatory Visit (HOSPITAL_COMMUNITY): Payer: Self-pay

## 2023-01-08 ENCOUNTER — Other Ambulatory Visit: Payer: Self-pay

## 2023-01-09 ENCOUNTER — Other Ambulatory Visit (HOSPITAL_COMMUNITY): Payer: Self-pay

## 2023-01-09 MED ORDER — TETANUS-DIPHTH-ACELL PERTUSSIS 5-2.5-18.5 LF-MCG/0.5 IM SUSY
0.5000 mL | PREFILLED_SYRINGE | Freq: Once | INTRAMUSCULAR | 0 refills | Status: AC
  Filled 2023-01-09: qty 0.5, 1d supply, fill #0

## 2023-01-10 ENCOUNTER — Telehealth: Payer: Self-pay

## 2023-01-10 NOTE — Telephone Encounter (Signed)
Transition Care Management Unsuccessful Follow-up Telephone Call  Date of discharge and from where:  12/25/2022 [facility]  Attempts:  1st Attempt  Reason for unsuccessful TCM follow-up call:  No answer/busy  Jenness Stemler [facility]  Outpatient Carecenter, [facility] Guide Direct Dial: [phone]  Website: [email or web address]

## 2023-01-13 ENCOUNTER — Encounter: Payer: Self-pay | Admitting: Family Medicine

## 2023-01-15 ENCOUNTER — Other Ambulatory Visit: Payer: Self-pay | Admitting: Family Medicine

## 2023-01-15 ENCOUNTER — Telehealth: Payer: Self-pay

## 2023-01-15 NOTE — Telephone Encounter (Signed)
Transition Care Management Follow-up Telephone Call Date of discharge and from where: 12/25/2022 [facility] How have you been since you were released from the hospital? Patient stated she feels much better. Any questions or concerns? No  Items Reviewed: Did the pt receive and understand the discharge instructions provided? Yes  Medications obtained and verified?  No medication prescribed this visit. Patient is able to afford medications. Other? No  Any new allergies since your discharge? No  Dietary orders reviewed? Yes Do you have support at home? Yes   Follow up appointments reviewed:  PCP Hospital f/u appt confirmed?  Patient followed-up via phone call.  Scheduled to see Jeani Sow, MD on 01/13/2023 @ [facility] [place]. Specialist Hospital f/u appt confirmed? Yes  Scheduled to see Mahesh A. Izora Ribas, MD on 03/02/2023 @ [facility]. Are transportation arrangements needed? No  If their condition worsens, is the pt aware to call PCP or go to the Emergency Dept.? Yes Was the patient provided with contact information for the PCP's office or ED? Yes Was to pt encouraged to call back with questions or concerns? Yes   Kenise Barraco [facility]  [facility], [facility] Guide Direct Dial: [phone]  Website: [email or web address]

## 2023-01-16 ENCOUNTER — Other Ambulatory Visit (HOSPITAL_COMMUNITY): Payer: Self-pay

## 2023-01-17 ENCOUNTER — Other Ambulatory Visit (HOSPITAL_COMMUNITY): Payer: Self-pay

## 2023-01-18 ENCOUNTER — Other Ambulatory Visit (HOSPITAL_COMMUNITY): Payer: Self-pay

## 2023-01-19 ENCOUNTER — Other Ambulatory Visit (HOSPITAL_COMMUNITY): Payer: Self-pay

## 2023-02-01 ENCOUNTER — Other Ambulatory Visit (HOSPITAL_COMMUNITY): Payer: Self-pay

## 2023-02-05 ENCOUNTER — Other Ambulatory Visit (HOSPITAL_COMMUNITY): Payer: Self-pay

## 2023-02-20 NOTE — Progress Notes (Signed)
 BH MD Outpatient Progress Note  02/21/2023 12:37 PM [name]  MRN:  [id number]  Assessment:  [name] presents for follow-up evaluation. Today, 02/21/23, patient reports exacerbation of mood and anxiety symptoms with subsequent worsening of insomnia associated with numerous acute stressors. She reports that these symptoms are already starting to improve as stressors have resolved although has lingering irritability and notably worsened insomnia. She identifies some benefit from higher Remeron  dosing as below and is interested in trial of additional agent for sleep. Will start Rozerem  at this time and psychoeducation provided on mechanism and importance of practicing appropriate sleep hygiene behaviors.   RTC in 2 months by video.  Identifying Information: [name] is a 78 y.o. female with a history of MDD, anxiety, arthritis, HTN, and prediabetes who is an established patient with [facility] participating in follow-up. Primary focus of treatment has been on chronic insomnia which has been fairly resistant to numerous medication trials. She has participated in CBT-i. While recommendation against long-term use of Ambien  has been discussed especially in older population, efforts have been made to improve safety of overall regimen including discontinuation of Klonopin . Will continue to work towards decrease and ultimately discontinuation of Ambien  as other strategies for management of insomnia are explored.  Plan:  # GAD  MDD in remission Past medication trials: Remeron  (appetite increase and weight gain); Klonopin  Status of problem: situational worsening Interventions: -- Continue Effexor  150 mg daily  # Insomnia, chronic Past medication trials: Restoril , Klonopin , Remeron  (appetite increase and weight gain), doxepin  (ineffective), trazodone  (nausea, diarrhea, dizziness), Atarax  (jumpy), gabapentin  (dopey), doxylamine   (ineffective) Status of problem: chronic; situational worsening Interventions: -- INCREASE Remeron  to 7.5-15 mg nightly PRN insomnia -- START Rozerem  8 mg nightly; to be taken 30 min prior to desired bedtime -- Continue Ambien  7.5 mg nightly   -- Risks, benefits, and side effects including but not limited to drowsiness and daytime sedation, dizziness, decreased mental alertness, falls and related injuries, increased risk for dementia was discussed   -- Have discussed plan for gradual taper and discontinuation of this medication over time given lack of indication for long term use -- Patient seen by CBT-i therapist Emma Ping LCS[facility] at [facility] -- Sleep hygiene reviewed -- Sleep study 05/10/20: mildest OSA; CPAP not recommended; could consider dental device  Patient was given contact information for behavioral health clinic and was instructed to call 911 for emergencies.   Subjective:  Chief Complaint:  Chief Complaint  Patient presents with   Medication Management    Interval History:   Visit today had to be conducted by phone due to patient's issues with video connection.  [name] reports she did not have good holidays as she had a lot of stressful things happen - reports husband was victim of financial scam, furnace went out and had to stay with kids, she got sick from higher dose of Zepbound . Reports these circumstances made her feel very vulnerable and sad. They have since returned home and starting to slowly feel better. Has started getting out and doing more the past week. However continues to wait for next shoe to drop. Anticipates mood will return to normal as she recovers from acute stressors.  Reports she had a lot of trouble sleeping during this time - took Remeron  7.5-15 mg and found higher dose helpful for sleep. Most recently, sleeping about 4-5 hours. Reports associated increase in irritability. Tries not to sleep during day but has been napping occasionally.  Tapering off psychotherapy as she felt they were approaching limits of benefit. Found it very helpful for understanding reactions and anxiety; however didn't find it helpful for insomnia. May reach back out if anxiety persists in s/o acute stressors.  Amenable to trial of Rozerem  at this time; education provided on MoA and importance of practicing appropriate saleep hygiene.   PDMP: -- Zolpidem  5 mg QTY 45 last filled 02/05/23 (regular rx dating back to Jan 2023)  Visit Diagnosis:    ICD-10-CM   1. Primary insomnia  F51.01 zolpidem  (AMBIEN ) 5 MG tablet    2. MDD (recurrent major depressive disorder) in remission (HCC)  F33.40     3. Generalized anxiety disorder  F41.1       Past Psychiatric History:  Diagnoses: MDD with anxiety Medication trials: Restoril , Remeron  (appetite increase and weight gain), doxepin  (ineffective), trazodone  (nausea, diarrhea, dizziness), melatonin, Atarax  (jumpy), gabapentin  (dopey), doxylamine  (ineffective) Suicide attempts: denies Substance use: denies tobacco or illicit drug use; etoh: 1 glass/month  Past Medical History:  Past Medical History:  Diagnosis Date   Allergy 1980's   Sulfa drugs and penecillin   Anxiety    Cataract 2021   Corrected   Depression    Headache    SINUS   Hyperlipidemia    Hypertension    Osteopenia    Osteopenia    Primary localized osteoarthritis of right knee     Past Surgical History:  Procedure Laterality Date   BREAST EXCISIONAL BIOPSY Right    benign cyst removal    CARPAL TUNNEL RELEASE Right 2015   Ortho in [place]   CHOLECYSTECTOMY N/A 02/08/2022   Procedure: LAPAROSCOPIC CHOLECYSTECTOMY WITH INTRAOPERATIVE CHOLANGIOGRAM;  Surgeon: Debby Hila, MD;  Location: [facility] ORS;  Service: General;  Laterality: N/A;   EYE SURGERY  12/2019   cataracts   JOINT REPLACEMENT  09/2015   Right knee   LASIK     MENISCUS REPAIR Right 2011   [facility]thopedic in [facility]   TOTAL KNEE ARTHROPLASTY Right 09/20/2015    TOTAL KNEE ARTHROPLASTY Right 09/20/2015   Procedure: TOTAL KNEE ARTHROPLASTY;  Surgeon: Lamar Millman, MD;  Location: [facility];  Service: Orthopedics;  Laterality: Right;   Trigger Thumb Right 2015   Done in [facility] at the same time as the carpal tunnel release   TUBAL LIGATION  1980    Family Psychiatric History:  Mothe: anxiety Maternal aunt: alcohol use, anorexia   Family History:  Family History  Problem Relation Age of Onset   Heart disease Mother    Arthritis Mother    Heart failure Mother    Heart attack Mother    Anxiety disorder Mother    Depression Mother    Hearing loss Mother    Varicose Veins Mother    Early death Father    Heart attack Father    Hearing loss Father    Heart disease Father    Hyperlipidemia Sister    Heart disease Sister    Hearing loss Sister    Arthritis Sister    Irregular heart beat Sister    Hypertension Sister    Hypertension Brother    Hyperlipidemia Brother    Heart attack Brother    Asthma Brother    Alcohol abuse Brother    Heart disease Brother    Early death Brother    Heart attack Brother    Depression Son    Alcohol abuse Maternal Aunt     Social History:  Social History   Socioeconomic History  Marital status: Married    Spouse name: Jerilynn   Number of children: 1   Years of education: 17.5   Highest education level: Master's degree (e.g., MA, MS, MEng, MEd, MSW, MBA)  Occupational History   Occupation: retired    Comment: comptroller  Tobacco Use   Smoking status: Never   Smokeless tobacco: Never  Vaping Use   Vaping status: Never Used  Substance and Sexual Activity   Alcohol use: Not Currently    Comment: 1 glass a month   Drug use: Yes    Types: Benzodiazepines   Sexual activity: Yes    Birth control/protection: Post-menopausal, None  Other Topics Concern   Not on file  Social History Narrative   Reads paper, walks, housework errands. Caffeine use daily.   3 grands   Social Drivers of Manufacturing Engineer Strain: Low Risk  (12/11/2022)   Overall Financial Resource Strain (CARDIA)    Difficulty of Paying Living Expenses: Not hard at all  Food Insecurity: No Food Insecurity (12/11/2022)   Hunger Vital Sign    Worried About Running Out of Food in the Last Year: Never true    Ran Out of Food in the Last Year: Never true  Transportation Needs: No Transportation Needs (12/11/2022)   PRAPARE - Administrator, Civil Service (Medical): No    Lack of Transportation (Non-Medical): No  Physical Activity: Insufficiently Active (12/11/2022)   Exercise Vital Sign    Days of Exercise per Week: 2 days    Minutes of Exercise per Session: 20 min  Stress: Stress Concern Present (12/11/2022)   [employer] of Occupational Health - Occupational Stress Questionnaire    Feeling of Stress : Rather much  Social Connections: Moderately Isolated (12/11/2022)   Social Connection and Isolation Panel [NHANES]    Frequency of Communication with Friends and Family: Once a week    Frequency of Social Gatherings with Friends and Family: Once a week    Attends Religious Services: Never    Database Administrator or Organizations: No    Attends Engineer, Structural: More than 4 times per year    Marital Status: Married    Allergies:  Allergies  Allergen Reactions   Penicillins Hives    Tolerates Cephalosporins    Prednisone  Other (See Comments)    Hyperactivity, sleep disturbance, confusion   Sulfa Antibiotics Hives         Current Medications: Current Outpatient Medications  Medication Sig Dispense Refill   ramelteon  (ROZEREM ) 8 MG tablet Take 1 tablet (8 mg total) by mouth at bedtime. To be taken 30 minutes prior to desired bedtime; at same time each night. 30 tablet 2   venlafaxine  XR (EFFEXOR -XR) 150 MG 24 hr capsule Take 1 capsule (150 mg total) by mouth daily with breakfast. 90 capsule 1   amLODipine -benazepril  (LOTREL ) 5-20 MG capsule Take 1 capsule by  mouth daily. 90 capsule 3   atorvastatin  (LIPITOR) 20 MG tablet Take 1 tablet (20 mg total) by mouth daily. 90 tablet 3   CALCIUM  PO Take 1,200 mg by mouth daily.     fluticasone  (FLONASE ) 50 MCG/ACT nasal spray Place 1 spray into both nostrils daily as needed for allergies or rhinitis.     lidocaine  (XYLOCAINE ) 2 % solution Use as directed 10 mLs in the mouth or throat every 6 (six) hours as needed for mouth pain. 100 mL 3   mirtazapine  (REMERON ) 7.5 MG tablet Take 1-2 tablets (7.5-15 mg  total) by mouth at bedtime as needed (sleep). 60 tablet 2   Multiple Vitamin (MULTIVITAMIN ADULT PO) Take 1 tablet by mouth daily.     raloxifene  (EVISTA ) 60 MG tablet Take 1 tablet (60 mg total) by mouth daily. 90 tablet 3   triamterene -hydrochlorothiazide  (DYAZIDE ) 37.5-25 MG capsule Take 1 each (1 capsule total) by mouth daily. 90 capsule 3   [START ON 03/07/2023] zolpidem  (AMBIEN ) 5 MG tablet Take 1.5 tablets (7.5 mg total) by mouth at bedtime as needed for sleep. 45 tablet 2   No current facility-administered medications for this visit.    ROS: Reports intentional weight loss  Objective:  Psychiatric Specialty Exam: There were no vitals taken for this visit.There is no height or weight on file to calculate BMI.  General Appearance:  Unable to assess d/t phone visit  Eye Contact:   Unable to assess d/t phone visit  Speech:  Clear and Coherent and Normal Rate  Volume:  Normal  Mood:   bad  Affect:   Unable to assess d/t phone visit  Thought Content:  Denies AVH; IOR; paranoia    Suicidal Thoughts:  No  Homicidal Thoughts:  No  Thought Process:  Goal Directed and Linear  Orientation:  Full (Time, Place, and Person)    Memory:   Grossly intact  Judgment:  Good  Insight:  Good  Concentration:  Concentration: Good  Recall:  NA  Fund of Knowledge: Good  Language: Good  Psychomotor Activity:   Unable to assess d/t phone visit  Akathisia:   Unable to assess d/t phone visit  AIMS (if  indicated): not done  Assets:  Communication Skills Desire for Improvement Financial Resources/Insurance Housing Intimacy Leisure Time Physical Health Resilience Social Support Talents/Skills Transportation  ADL's:  Intact  Cognition: WNL  Sleep:   chronic insomnia   PE: Unable to assess d/t phone visit  Metabolic Disorder Labs: Lab Results  Component Value Date   HGBA1C 6.0 04/17/2022   MPG 131 01/17/2019   MPG 128 11/28/2017   No results found for: PROLACTIN Lab Results  Component Value Date   CHOL 172 04/17/2022   TRIG 123.0 04/17/2022   HDL 69.80 04/17/2022   CHOLHDL 2 04/17/2022   VLDL 24.6 04/17/2022   LDLCALC 77 04/17/2022   LDLCALC 94 06/30/2021   Lab Results  Component Value Date   TSH 3.07 04/17/2022   TSH 4.01 01/08/2017    Therapeutic Level Labs: No results found for: LITHIUM No results found for: VALPROATE No results found for: CBMZ  Screenings:  GAD-7    Flowsheet Row Office Visit from 11/15/2022 in [facility] [employer] from 09/27/2022 in [facility] [employer] [facility] [employer] from 06/09/2022 in [facility] [employer] [facility] [employer] from 04/17/2022 in [facility] Video Visit from 03/17/2022 in [facility]  Total GAD-7 Score 7 8 16 18 9       PHQ2-9    Flowsheet Row Office Visit from 11/15/2022 in [facility] [employer] Visit from 09/27/2022 in [facility] Clinical Support from 08/08/2022 in [facility] [employer] Visit from 06/09/2022 in [facility] [employer] Visit from 04/17/2022 in [facility] Total Score 2 2 2 2 4   PHQ-9  Total Score 5 10 6 11 15       Flowsheet Row ED from 12/25/2022 in [facility]  Emergency Department at [facility] ED from 12/04/2022 in [facility]gent Care at [facility] ED to Hosp-Admission (Discharged) from 02/05/2022 in [facility] 5 EAST MEDICAL UNIT  C-SSRS RISK CATEGORY No Risk No Risk No Risk       Collaboration of Care: Collaboration of Care: Medication Management AEB ongoing medication management and Psychiatrist AEB established with this provider  Patient/Guardian was advised Release of Information must be obtained prior to any record release in order to collaborate their care with an outside provider. Patient/Guardian was advised if they have not already done so to contact the registration department to sign all necessary forms in order for us  to release information regarding their care.   Consent: Patient/Guardian gives verbal consent for treatment and assignment of benefits for services provided during this visit. Patient/Guardian expressed understanding and agreed to proceed.   Virtual Visit via Telephone Note  I connected with [name] on 02/21/23 at  9:30 AM EST by telephone and verified that I am speaking with the correct person using two identifiers.  Location: Patient: home address in [place] Provider: remote office in [place]   I discussed the limitations, risks, security and privacy concerns of performing an evaluation and management service by telephone and the availability of in person appointments. I also discussed with the patient that there may be a patient responsible charge related to this service. The patient expressed understanding and agreed to proceed.  I discussed the assessment and treatment plan with the patient. The patient was provided an opportunity to ask questions and all were answered. The patient agreed with the plan and demonstrated an understanding of the instructions.   The patient was advised to call back or seek an in-person evaluation if the symptoms worsen or if the condition fails to  improve as anticipated.  I provided 40 minutes of non-face-to-face time during this encounter.  Genesi Stefanko A Taylie Helder 02/21/2023, 12:37 PM

## 2023-02-21 ENCOUNTER — Telehealth (HOSPITAL_COMMUNITY): Payer: Medicare Other | Admitting: Psychiatry

## 2023-02-21 ENCOUNTER — Other Ambulatory Visit: Payer: Self-pay

## 2023-02-21 ENCOUNTER — Encounter (HOSPITAL_COMMUNITY): Payer: Self-pay | Admitting: Psychiatry

## 2023-02-21 ENCOUNTER — Other Ambulatory Visit (HOSPITAL_COMMUNITY): Payer: Self-pay

## 2023-02-21 DIAGNOSIS — F411 Generalized anxiety disorder: Secondary | ICD-10-CM | POA: Diagnosis not present

## 2023-02-21 DIAGNOSIS — F3340 Major depressive disorder, recurrent, in remission, unspecified: Secondary | ICD-10-CM

## 2023-02-21 DIAGNOSIS — F5101 Primary insomnia: Secondary | ICD-10-CM

## 2023-02-21 MED ORDER — MIRTAZAPINE 7.5 MG PO TABS
7.5000 mg | ORAL_TABLET | Freq: Every evening | ORAL | 2 refills | Status: DC | PRN
  Filled 2023-02-21 – 2023-02-28 (×3): qty 60, 30d supply, fill #0
  Filled 2023-03-29: qty 60, 30d supply, fill #1
  Filled 2023-04-30: qty 60, 30d supply, fill #2

## 2023-02-21 MED ORDER — RAMELTEON 8 MG PO TABS
8.0000 mg | ORAL_TABLET | Freq: Every day | ORAL | 2 refills | Status: DC
  Filled 2023-02-21: qty 30, 30d supply, fill #0

## 2023-02-21 MED ORDER — ZOLPIDEM TARTRATE 5 MG PO TABS
7.5000 mg | ORAL_TABLET | Freq: Every evening | ORAL | 2 refills | Status: DC | PRN
  Filled 2023-02-21 – 2023-03-05 (×2): qty 45, 30d supply, fill #0
  Filled 2023-03-29 – 2023-04-02 (×2): qty 45, 30d supply, fill #1
  Filled 2023-04-30: qty 45, 30d supply, fill #2

## 2023-02-21 NOTE — Patient Instructions (Signed)
 Thank you for attending your appointment today.  -- INCREASE mirtazapine  to 1-2 tablets nightly as needed for sleep -- START Rozerem  8 mg nightly; to be taken 30 minutes prior to desired bedtime -- Continue other medications as prescribed.  Please do not make any changes to medications without first discussing with your provider. If you are experiencing a psychiatric emergency, please call 911 or present to your nearest emergency department. Additional crisis, medication management, and therapy resources are included below.  [facility]  [address], [place], [place] [address] [phone] WALK-IN URGENT CARE 24/7 FOR ANYONE [address], [place], [place]  [phone] Fax: [phone] [email or web address] *Interpreters available *Accepts all insurance and uninsured for Urgent Care needs *Accepts Medicaid and uninsured for outpatient treatment (below)      ONLY FOR [facility]  Below:    Outpatient New Patient Assessment/Therapy Walk-ins:        Monday, Wednesday, and Thursday 8am until slots are full (first come, first served)                   New Patient Psychiatry/Medication Management        Monday-Friday 8am-11am (first come, first served)               For all walk-ins we ask that you arrive by 7:15am, because patients will be seen in the order of arrival.

## 2023-02-22 ENCOUNTER — Other Ambulatory Visit (HOSPITAL_COMMUNITY): Payer: Self-pay

## 2023-02-28 ENCOUNTER — Other Ambulatory Visit (HOSPITAL_COMMUNITY): Payer: Self-pay

## 2023-02-28 ENCOUNTER — Other Ambulatory Visit: Payer: Self-pay

## 2023-03-02 ENCOUNTER — Encounter: Payer: Self-pay | Admitting: Internal Medicine

## 2023-03-02 ENCOUNTER — Ambulatory Visit: Payer: Medicare Other | Attending: Internal Medicine | Admitting: Internal Medicine

## 2023-03-02 VITALS — BP 134/60 | HR 94 | Ht 62.0 in | Wt 183.0 lb

## 2023-03-02 DIAGNOSIS — N183 Chronic kidney disease, stage 3 unspecified: Secondary | ICD-10-CM

## 2023-03-02 DIAGNOSIS — R0602 Shortness of breath: Secondary | ICD-10-CM | POA: Diagnosis not present

## 2023-03-02 DIAGNOSIS — I700 Atherosclerosis of aorta: Secondary | ICD-10-CM

## 2023-03-02 DIAGNOSIS — I10 Essential (primary) hypertension: Secondary | ICD-10-CM | POA: Diagnosis not present

## 2023-03-02 DIAGNOSIS — R0609 Other forms of dyspnea: Secondary | ICD-10-CM

## 2023-03-02 DIAGNOSIS — E782 Mixed hyperlipidemia: Secondary | ICD-10-CM | POA: Diagnosis not present

## 2023-03-02 NOTE — Progress Notes (Signed)
Cardiology Office Note:  .    Date:  03/02/2023  ID:  Ah Trost, DOB 04-24-1945, MRN [id number] PCP: Jeani Sow, MD  [facility] Providers Cardiologist:  None     CC: follow up ED visit Consulted for the evaluation of DOE at the behest of Dr. Jearld Fenton   History of Present Illness: Marland Kitchen    [name] is a 78 y.o. female with a history of hypertension, aortic atherosclerosis, hyperlipidemia, and daytime somnolence,. The patient had a recent visit to the emergency room due to weakness and dyspnea, with a past medical history of pulmonary embolism. She also has a history of chronic kidney disease, stage 3A to 3B.  The patient reported a stressful year, during which she experienced excessive sweating and shaking with any exertion, leading to an emergency room visit. However, no significant findings were reported during that visit. The patient also reported a history of gallbladder issues around Christmas the previous year, which resulted in lab tests showing abnormal results, but these later returned to normal.  The patient's symptoms of discomfort and breathing issues were primarily triggered by activities such as carrying groceries, during which she would sweat profusely. However, these symptoms have since subsided. The patient denied any chest pain or pressure during these episodes.  The patient has a family history of heart problems on her father's side but denied any personal history of blood clots. The patient is currently not very active, with the most strenuous activities being housework and occasional walks in the neighborhood.  The patient's cholesterol levels were reported to be slightly elevated, and there was evidence of aortic atherosclerosis from a CT scan conducted in 2023.     Relevant histories: .  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   Studies Reviewed: .   Cardiac Studies &  Procedures     STRESS TESTS  EXERCISE TOLERANCE TEST (ETT) 01/16/2017  Narrative  The patient walked 5: 26 of a standard Bruce protocol treadmill test. She achieved a peak heart rate of 153 which is 103% predicted maximal heart rate. The blood pressure response to exercise was normal.  There were no ST or T wave changes to suggest ischemia.  Negative treadmill test. No evidence of ischemia               Physical Exam:    VS:  BP 134/60 (BP Location: Right Arm)   Pulse 94   Ht 5\' 2"  (1.575 m)   Wt 183 lb (83 kg)   SpO2 99%   BMI 33.47 kg/m    Wt Readings from Last 3 Encounters:  03/02/23 183 lb (83 kg)  12/25/22 195 lb 8.8 oz (88.7 kg)  11/15/22 195 lb 8 oz (88.7 kg)    Gen: no distress  Neck: No JVD Cardiac: No Rubs or Gallops, no murmur, RRR +2 radial pulses Respiratory: Clear to auscultation bilaterally, normal effort, normal  respiratory rate GI: Soft, nontender, non-distended  MS: No  edema;  moves all extremities Integument: Skin feels warm Neuro:  At time of evaluation, alert and oriented to person/place/time/situation  Psych: Normal affect, patient feels warm  ASSESSMENT AND PLAN: .    Dyspnea on Exertion Reports dyspnea on exertion, evaluated in the emergency room without significant findings. Experienced sweating and shaking with exertion, symptoms resolved. Family history of heart disease on paternal side. Discussed risk factors including aortic atherosclerosis and hyperlipidemia. Explained options  for further evaluation: stress test and coronary artery calcium score. Opted for stress test. Explained that a negative stress test indicates a low risk of MACE mortality. - Order exercise nuclear medicine stress test - Schedule follow-up if stress test is positive  Aortic Atherosclerosis Aortic atherosclerosis with calcium buildup in aorta and femoral arteries on 2023 CT scan. Reviewed CT images, explaining calcium buildup and implications. Emphasized importance  of managing hyperlipidemia to prevent further progression. - Discuss aggressive cholesterol management if stress test is negative - I offered for her to follow up with our team for aggressive prevention (declined)  Hyperlipidemia Elevated cholesterol levels noted in the past year. Discussed importance of aggressive cholesterol management, especially given aortic atherosclerosis. Informed about potential benefits of lowering cholesterol to reduce cardiovascular risk.  Chronic Kidney Disease (CKD) Stage 3A to 3B CKD stage 3A to 3B, with past labs showing elevated creatinine and reduced GFR. Reassured that dialysis is not immediately necessary but emphasized importance of monitoring kidney function. - CCTA deferred  General Health Maintenance 78 year old with multiple chronic conditions including hypertension, aortic atherosclerosis, hyperlipidemia, and CKD. Emphasized importance of regular follow-up and management to maintain overall health. - She has elected PRN follow up unless positive stress test   Riley Lam, MD FASE [facility] Cardiologist [facility]  [address] [place], [address] [place], [place] [address] [phone]  10:18 AM

## 2023-03-02 NOTE — Patient Instructions (Addendum)
Medication Instructions:  Your physician recommends that you continue on your current medications as directed. Please refer to the Current Medication list given to you today.  *If you need a refill on your cardiac medications before your next appointment, please call your pharmacy*   Lab Work: NONE  If you have labs (blood work) drawn today and your tests are completely normal, you will receive your results only by: MyChart Message (if you have MyChart) OR A paper copy in the mail If you have any lab test that is abnormal or we need to change your treatment, we will call you to review the results.   Testing/Procedures: Your physician has requested that you have en exercise stress myoview. For further information please visit [email or web address] Please follow instruction sheet, as given.   You are scheduled for a Myocardial Perfusion Imaging Study. Please arrive 15 minutes prior to your appointment time for registration and insurance purposes.   The test will take approximately 3 to 4 hours to complete; you may bring reading material.  If someone comes with you to your appointment, they will need to remain in the main lobby due to limited space in the testing area.   How to prepare for your Myocardial Perfusion Test: Do not eat or drink 3 hours prior to your test, except you may have water. Do not consume products containing caffeine (regular or decaffeinated) 12 hours prior to your test. (ex: coffee, chocolate, sodas, tea). Do bring a list of your current medications with you.  If not listed below, you may take your medications as normal. Do wear comfortable clothes (no dresses or overalls) and walking shoes, tennis shoes preferred (No heels or open toe shoes are allowed). Do NOT wear cologne, perfume, aftershave, or lotions (deodorant is allowed). If these instructions are not followed, your test will have to be rescheduled.  If you cannot keep your appointment, please provide 24  hours notification to the Nuclear Lab, to avoid a possible $50 charge to your account.      Follow-Up:As needed At [facility], you and your health needs are our priority.  As part of our continuing mission to provide you with exceptional heart care, we have created designated Provider Care Teams.  These Care Teams include your primary Cardiologist (physician) and Advanced Practice Providers (APPs -  Physician Assistants and Nurse Practitioners) who all work together to provide you with the care you need, when you need it.  Provider:   Riley Lam, MD

## 2023-03-05 ENCOUNTER — Other Ambulatory Visit (HOSPITAL_COMMUNITY): Payer: Self-pay

## 2023-03-05 ENCOUNTER — Other Ambulatory Visit: Payer: Self-pay

## 2023-03-06 ENCOUNTER — Encounter (HOSPITAL_COMMUNITY): Payer: Self-pay

## 2023-03-12 ENCOUNTER — Telehealth (HOSPITAL_COMMUNITY): Payer: Self-pay | Admitting: *Deleted

## 2023-03-12 NOTE — Telephone Encounter (Signed)
Patient given detailed instructions per Myocardial Perfusion Study Information Sheet for the test on 03/13/23 Patient notified to arrive 15 minutes early and that it is imperative to arrive on time for appointment to keep from having the test rescheduled.  If you need to cancel or reschedule your appointment, please call the office within 24 hours of your appointment. . Patient verbalized understanding. Ricky Ala

## 2023-03-13 ENCOUNTER — Ambulatory Visit (HOSPITAL_COMMUNITY): Payer: Medicare Other | Attending: Internal Medicine

## 2023-03-13 ENCOUNTER — Encounter: Payer: Self-pay | Admitting: Internal Medicine

## 2023-03-13 DIAGNOSIS — R0609 Other forms of dyspnea: Secondary | ICD-10-CM | POA: Diagnosis not present

## 2023-03-13 DIAGNOSIS — R0602 Shortness of breath: Secondary | ICD-10-CM | POA: Diagnosis not present

## 2023-03-13 LAB — MYOCARDIAL PERFUSION IMAGING
Estimated workload: 1
Exercise duration (min): 1 min
Exercise duration (sec): 0 s
LV dias vol: 38 mL (ref 46–106)
LV sys vol: 6 mL
MPHR: 142 {beats}/min
Nuc Stress EF: 84 %
Peak HR: 107 {beats}/min
Percent HR: 75 %
Rest HR: 93 {beats}/min
Rest Nuclear Isotope Dose: 10.8 mCi
SDS: 0
SRS: 0
SSS: 0
ST Depression (mm): 0 mm
Stress Nuclear Isotope Dose: 32.5 mCi
TID: 1.17

## 2023-03-13 MED ORDER — REGADENOSON 0.4 MG/5ML IV SOLN
0.4000 mg | Freq: Once | INTRAVENOUS | Status: AC
  Administered 2023-03-13: 0.4 mg via INTRAVENOUS

## 2023-03-13 MED ORDER — TECHNETIUM TC 99M TETROFOSMIN IV KIT
32.5000 | PACK | Freq: Once | INTRAVENOUS | Status: AC | PRN
Start: 2023-03-13 — End: 2023-03-13
  Administered 2023-03-13: 32.5 via INTRAVENOUS

## 2023-03-13 MED ORDER — TECHNETIUM TC 99M TETROFOSMIN IV KIT
10.8000 | PACK | Freq: Once | INTRAVENOUS | Status: AC | PRN
  Administered 2023-03-13: 10.8 via INTRAVENOUS

## 2023-03-21 ENCOUNTER — Encounter: Payer: Self-pay | Admitting: Family Medicine

## 2023-03-21 ENCOUNTER — Ambulatory Visit (INDEPENDENT_AMBULATORY_CARE_PROVIDER_SITE_OTHER): Payer: Medicare Other | Admitting: Family Medicine

## 2023-03-21 ENCOUNTER — Other Ambulatory Visit: Payer: Self-pay

## 2023-03-21 VITALS — BP 124/78 | HR 80 | Ht 62.0 in | Wt 190.0 lb

## 2023-03-21 DIAGNOSIS — G8929 Other chronic pain: Secondary | ICD-10-CM | POA: Diagnosis not present

## 2023-03-21 DIAGNOSIS — M79641 Pain in right hand: Secondary | ICD-10-CM | POA: Diagnosis not present

## 2023-03-21 DIAGNOSIS — M79642 Pain in left hand: Secondary | ICD-10-CM | POA: Diagnosis not present

## 2023-03-21 DIAGNOSIS — M79645 Pain in left finger(s): Secondary | ICD-10-CM

## 2023-03-21 NOTE — Progress Notes (Signed)
   I, Leotis Batter, CMA acting as a scribe for Artist Lloyd, MD.  [name] is a 78 y.o. female who presents to [facility] at [facility] today for bilat hand pain x several years, hx of trigger finger with surgical repair. She has been seen for this issue by [facility]. Pt locates pain to the left thumb. Hx of surgery for right thumb. Pt is RHD. Gripping OK, decreased in the left hand. Short-term relief with Diclofenac .   Radiates hand and fingers Paresthesia: intermittently in the left hand Grip strength: decreased in left Aggravates: use Treatments tried: topical Diclofenac , Tylenol , Advil   Pertinent review of systems: no fever or chills  Relevant historical information: Right Thumb surgery in the past.    Exam:  BP 124/78   Pulse 80   Ht 5' 2 (1.575 m)   Wt 190 lb (86.2 kg)   SpO2 97%   BMI 34.75 kg/m  General: Well Developed, well nourished, and in no acute distress.   MSK: Diffuse changes both hands.  Tender palpation especially left first CMC.    Lab and Radiology Results  XRAY REPORT Ortho : Radiographs were obtained of bilateral hands AP, lateral, oblique views.   These were independently reviewed interpreted by myself demonstrating previous thumb arthroplasty with proximal subsidence she has left severe CMC arthritis STT arthritis she is index middle and ring finger MCP arthritis bilaterally.  No acute fractures, subluxations, dislocations.   No aggressive osseous lesions. Exam End: 07/04/22 14:48       Assessment and Plan: 78 y.o. female with BL hand pain left worse than right. She has diffuse DJD worse at the left 1st [facility].  Plan for trial of OT, heat, and Voltaren  gel.  Recheck in 6 weeks. Plan for steroid injection if needed especially if not improving or worsening.    PDMP not reviewed this encounter. Orders Placed This Encounter  Procedures   Ambulatory referral to Occupational Therapy    Referral  Priority:   Routine    Referral Type:   Occupational Therapy    Referral Reason:   Specialty Services Required    Requested Specialty:   Occupational Therapy    Number of Visits Requested:   1   No orders of the defined types were placed in this encounter.    Discussed warning signs or symptoms. Please see discharge instructions. Patient expresses understanding.   The above documentation has been reviewed and is accurate and complete Artist Lloyd, M.D.

## 2023-03-21 NOTE — Patient Instructions (Addendum)
 Thank you for coming in today.   I've referred you to Occupational Therapy.  Let us  know if you don't hear from them in one week.   Please use Voltaren  gel (Generic Diclofenac  Gel) up to 4x daily for pain as needed.  This is available over-the-counter as both the name brand Voltaren  gel and the generic diclofenac  gel.   Recheck in about 6 weeks.  If we need to do an injection sooner let me know.

## 2023-03-29 NOTE — Therapy (Signed)
 OUTPATIENT OCCUPATIONAL THERAPY ORTHO EVALUATION  Patient Name: [name] MRN: [id number] DOB:August 02, 1945, 78 y.o., female Today's Date: 04/02/2023  PCP: Joya San MD REFERRING PROVIDER: Rodolph Bong, MD   END OF SESSION:  OT End of Session - 04/02/23 1102     Visit Number 1    Number of Visits 6    Date for OT Re-Evaluation 05/18/23    Authorization Type Medicare    OT Start Time 1102    OT Stop Time 1150    OT Time Calculation (min) 48 min    Equipment Utilized During Treatment Orthotic materials    Activity Tolerance Patient tolerated treatment well;No increased pain;Patient limited by pain;Patient limited by fatigue    Behavior During Therapy [facility] for tasks assessed/performed             Past Medical History:  Diagnosis Date   Allergy 1980's   Sulfa drugs and penecillin   Anxiety    Cataract 2021   Corrected   Depression    Headache    SINUS   Hyperlipidemia    Hypertension    Osteopenia    Osteopenia    Primary localized osteoarthritis of right knee    Past Surgical History:  Procedure Laterality Date   BREAST EXCISIONAL BIOPSY Right    benign cyst removal    CARPAL TUNNEL RELEASE Right 2015   Ortho in [place]   CHOLECYSTECTOMY N/A 02/08/2022   Procedure: LAPAROSCOPIC CHOLECYSTECTOMY WITH INTRAOPERATIVE CHOLANGIOGRAM;  Surgeon: Romie Levee, MD;  Location: [facility] ORS;  Service: General;  Laterality: N/A;   EYE SURGERY  12/2019   cataracts   JOINT REPLACEMENT  09/2015   Right knee   LASIK     MENISCUS REPAIR Right 2011   [facility]thopedic in [facility]   TOTAL KNEE ARTHROPLASTY Right 09/20/2015   TOTAL KNEE ARTHROPLASTY Right 09/20/2015   Procedure: TOTAL KNEE ARTHROPLASTY;  Surgeon: Salvatore Marvel, MD;  Location: [facility] OR;  Service: Orthopedics;  Laterality: Right;   Trigger Thumb Right 2015   Done in [place] at the same time as the carpal tunnel release   TUBAL LIGATION  1980   Patient Active Problem List   Diagnosis Date Noted   DOE (dyspnea on  exertion) 03/02/2023   Stage 3 chronic kidney disease (HCC) 03/02/2023   Primary osteoarthritis involving multiple joints 06/11/2022   Aortic atherosclerosis (HCC) 02/05/2022   Facial lesion 09/30/2021   Bilateral hand pain 09/30/2021   Impingement syndrome, shoulder, left 02/16/2021   Impingement syndrome, shoulder, right 09/07/2020   MDD (recurrent major depressive disorder) in remission (HCC) 09/02/2019   Generalized anxiety disorder 09/02/2019   Primary insomnia 08/16/2019   Post-menopausal 05/14/2017   Prediabetes 01/12/2017   Snoring 07/20/2016   Obsessive-compulsive personality trait 12/07/2015   Primary localized osteoarthritis of right knee    Essential hypertension 08/04/2015   Hyperlipidemia 08/04/2015   Osteopenia 08/04/2015    ONSET DATE: chronic onset   REFERRING DIAG:  M79.645,G89.29 (ICD-10-CM) - Chronic pain of left thumb  M79.641,M79.642 (ICD-10-CM) - Bilateral hand pain    THERAPY DIAG:  Pain in left hand  Muscle weakness (generalized)  Other lack of coordination  Stiffness of right hand, not elsewhere classified  Rationale for Evaluation and Treatment: Rehabilitation  SUBJECTIVE:   SUBJECTIVE STATEMENT: She states her right thumb has chronically had problems for which she had a surgery (trigger thumb release as well as CMC joint debridement seems likely).  She cannot recall the specific procedures that were done.  She now states  her left thumb has new and increasing pain at the base of the [facility].  She also states her right middle finger is very painful and stiff at the DIP joint.  She would like some therapy for all of her arthritis issues and to help her have better function with her hands.Marland Kitchen    PERTINENT HISTORY: per MD note: ""78 y.o. female who presents to [facility] at [facility] today for bilat hand pain x several years, hx of trigger finger with surgical repair. She has been seen for this issue by [facility]. Pt locates pain to  the left thumb. Hx of surgery for right thumb. Pt is RHD. Gripping OK, decreased in the left hand. Short-term relief with Diclofenac. "   PRECAUTIONS: None  RED FLAGS: None   WEIGHT BEARING RESTRICTIONS: No  PAIN:  Are you having pain? Yes: NPRS scale: 3/10 "low ache" up to 6-7/10 in past week at worst when opening jars, etc.  Pain location: Bilateral thumb CMC joints and right middle finger DIP joint Pain description: Aching and sore Aggravating factors: Gripping and forceful activities Relieving factors: Rest, ice, heat  FALLS: Has patient fallen in last 6 months? No  PLOF: Independent  PATIENT GOALS: To improve pain and arthritis symptoms in both hands  NEXT MD VISIT: As needed  OBJECTIVE: (All objective assessments below are from initial evaluation on: 04/02/23 unless otherwise specified.)    HAND DOMINANCE: Right   ADLs: Overall ADLs: States decreased ability to grab, hold household objects, pain and difficulty to open containers, perform FMS tasks (manipulate fasteners on clothing), mild to moderate bathing problems as well.    FUNCTIONAL OUTCOME MEASURES: Eval: Patient Specific Functional Scale: 4.6 (Opening jars, lifting household objects, gripping utensils)  (Higher Score  =  Better Ability for the Selected Tasks)      UPPER EXTREMITY ROM     Shoulder to Wrist AROM Right eval Left eval  Shoulder flexion    Shoulder abduction    Shoulder extension    Shoulder internal rotation    Shoulder external rotation    Elbow flexion    Elbow extension    Forearm supination    Forearm pronation     Wrist flexion 72 71  Wrist extension 53 70  Wrist ulnar deviation    Wrist radial deviation    Functional dart thrower's motion (F-DTM) in ulnar flexion    F-DTM in radial extension     (Blank rows = not tested)   Hand AROM Right eval Left eval  Full Fist Ability (or Gap to Distal Palmar Crease) full full  Thumb Opposition  (Kapandji Scale)     Thumb MCP (0-60)     Thumb IP (0-80)    Thumb Radial Abduction Span 27* 37*   Thumb Palmar Abduction Span     Index MCP (0-90)     Index PIP (0-100)     Index DIP (0-70)      Long MCP (0-90)      Long PIP (0-100)      Long DIP (0-70)      Ring MCP (0-90)      Ring PIP (0-100)      Ring DIP (0-70)      Little MCP (0-90)      Little PIP (0-100)      Little DIP (0-70)      (Blank rows = not tested)   HAND FUNCTION: Eval: Observed weakness in affected bilateral hands.  Grip strength Right: 31 lbs,  Left: 29 lbs   COORDINATION: Eval: Only mild observed coordination issues with both hands due to pain and stiffness  SENSATION: Eval:  Light touch intact today    COGNITION: Eval: Overall cognitive status: WFL for evaluation today   OBSERVATIONS:   Eval: Overt Heberden's nodes nodes at the DIP joints, tender to touch at MCPs, thumb CMC J's as well as these nodes.  Moderate collapse deformity or zigzag deformity in right thumb noted and this is also starting in the left thumb.  Overt pain and stiffness noted.  Left hand has apparent poor finger extension in the index and ring fingers after a trigger finger release many years ago (she has difficulty explaining her hand open and the longitudinal arch).    TODAY'S TREATMENT:  Post-evaluation treatment:   She was given copious education today on thumb collapse deformity and prevention of strong hyperextension of MCP joint as well as trying to prevent CMC joint from crowding the palm.  She was given a list of management strategies for hand arthritis including activity modification, bracing, exercise program.  To help with Rt thumb collapse deformity, MP hyperextension and crowding the palm-OT custom fabricated a right hand thumb MCP joint flexion orthosis that allows the IP joint free.  If it well, did not cause irritation right now-though she was advised to take off if it does cause irritation.  She only needs to wear it in the day when performing her  normal daily activities but should avoid getting it wet.  If she wishes, she was also educated on compression gloves that she could purchase which may help support her.  She was also given a home exercise program as listed below to help loosen her joints and decrease pain complaints.  OT did these with her with explanation and demonstration, which she tolerated fairly well though we were somewhat rushed due to the constrained time of evaluation.  We will need more time with these in the next session.  Exercises - Seated Wrist Flexion Stretch  - 3 x daily - 3 reps - 15 hold - Wrist Prayer Stretch  - 3 x daily - 3 reps - 15 sec hold - Stretch Thumb DOWNWARD  - 3 x daily - 3 reps - 15 sec hold - Thumb Webspace Stretch  - 3 x daily - 3 reps - 15 sec hold - Towel Roll Grip with Forearm in Neutral  - 3 x daily - 5 reps - 10 sec hold   PATIENT EDUCATION: Education details: See tx section above for details  Person educated: Patient Education method: Verbal Instruction, Teach back, Handouts  Education comprehension: States and demonstrates understanding, Additional Education required    HOME EXERCISE PROGRAM: Access Code: [email or web address] URL: [email or web address] Date: 04/02/2023 Prepared by: Fannie Knee   GOALS: Goals reviewed with patient? Yes   SHORT TERM GOALS: (STG required if POC>30 days) Target Date: 04/20/2023  Pt will obtain protective, custom orthotic. Goal status: 04/02/2023: Partially met-has right thumb orthosis.  2.  Pt will demo/state understanding of initial HEP to improve pain levels and prerequisite motion. Goal status: INITIAL   LONG TERM GOALS: Target Date: 05/18/23  Pt will improve functional ability by decreased impairment per PSFS assessment from 4.6 to 7.5 or better, for better quality of life. Goal status: INITIAL  2.  Pt will improve grip strength in bilateral hands from approximately 30 lbs to at least 35 lbs for functional use at home and in  IADLs. Goal status: INITIAL  3.  Pt  will improve A/ROM in right thumb radial abduction from 27 degrees to at least 33 degrees, to have functional motion for tasks like reach and grasp.  Goal status: INITIAL  4.  Pt will decrease pain at worst from 6-7/10 to 2-3/10 or better to have better sleep and occupational participation in daily roles. Goal status: INITIAL   ASSESSMENT:  CLINICAL IMPRESSION: Patient is a 78 y.o. female who was seen today for occupational therapy evaluation for pain, stiffness, weakness in bilateral hands with the left being more painful than the right.  She also has lingering issues after a trigger finger release with inability to stretch open the longitudinal arch of the left hand.  She will benefit from outpatient occupational therapy to assist with all of these issues and to increase her quality of life.   PERFORMANCE DEFICITS: in functional skills including ADLs, IADLs, coordination, dexterity, ROM, strength, pain, fascial restrictions, flexibility, Fine motor control, body mechanics, endurance, decreased knowledge of precautions, and UE functional use, cognitive skills including problem solving and safety awareness, and psychosocial skills including habits and routines and behaviors.   IMPAIRMENTS: are limiting patient from ADLs and IADLs.   COMORBIDITIES: may have co-morbidities  that affects occupational performance. Patient will benefit from skilled OT to address above impairments and improve overall function.  MODIFICATION OR ASSISTANCE TO COMPLETE EVALUATION: No modification of tasks or assist necessary to complete an evaluation.  OT OCCUPATIONAL PROFILE AND HISTORY: Problem focused assessment: Including review of records relating to presenting problem.  CLINICAL DECISION MAKING: Moderate - several treatment options, min-mod task modification necessary  REHAB POTENTIAL: Good  EVALUATION COMPLEXITY: Low      PLAN:  OT FREQUENCY: 1x/week  OT  DURATION: 6 weeks through 05/18/2023 and up to 6 total visits as needed medically  PLANNED INTERVENTIONS: 97168 OT Re-evaluation, 97535 self care/ADL training, 62831 therapeutic exercise, 97530 therapeutic activity, 97112 neuromuscular re-education, 97140 manual therapy, 97035 ultrasound, 97039 fluidotherapy, 97010 moist heat, 97010 cryotherapy, 97760 Orthotics management and training, 51761 Splinting (initial encounter), [id number] Subsequent splinting/medication, Dry needling, energy conservation, coping strategies training, patient/family education, and DME and/or AE instructions  RECOMMENDED OTHER SERVICES: None now  CONSULTED AND AGREED WITH PLAN OF CARE: Patient  PLAN FOR NEXT SESSION:   Make Lt hand ext night brace, check Rt thumb brace & HEP   Talib Headley, OTR/L, CHT 04/02/2023, 5:26 PM

## 2023-03-30 ENCOUNTER — Other Ambulatory Visit (HOSPITAL_COMMUNITY): Payer: Self-pay

## 2023-03-30 ENCOUNTER — Other Ambulatory Visit: Payer: Self-pay

## 2023-03-31 ENCOUNTER — Other Ambulatory Visit (HOSPITAL_COMMUNITY): Payer: Self-pay

## 2023-04-02 ENCOUNTER — Ambulatory Visit (INDEPENDENT_AMBULATORY_CARE_PROVIDER_SITE_OTHER): Payer: Medicare Other | Admitting: Rehabilitative and Restorative Service Providers"

## 2023-04-02 ENCOUNTER — Other Ambulatory Visit (HOSPITAL_COMMUNITY): Payer: Self-pay

## 2023-04-02 ENCOUNTER — Other Ambulatory Visit: Payer: Self-pay

## 2023-04-02 ENCOUNTER — Encounter: Payer: Self-pay | Admitting: Rehabilitative and Restorative Service Providers"

## 2023-04-02 DIAGNOSIS — M6281 Muscle weakness (generalized): Secondary | ICD-10-CM

## 2023-04-02 DIAGNOSIS — M79642 Pain in left hand: Secondary | ICD-10-CM

## 2023-04-02 DIAGNOSIS — M79641 Pain in right hand: Secondary | ICD-10-CM

## 2023-04-02 DIAGNOSIS — M25641 Stiffness of right hand, not elsewhere classified: Secondary | ICD-10-CM | POA: Diagnosis not present

## 2023-04-02 DIAGNOSIS — R278 Other lack of coordination: Secondary | ICD-10-CM

## 2023-04-02 NOTE — Patient Instructions (Signed)
 Management of hand arthritis  The goal is to help your hands last as long as possible, and hopefully feel a bit better as well.   How you will be successful:  Change habits/routines, prevent degeneration and pain Exercise program to "open" joint spaces, relieve pressure and tension Protect with supportive bracing   Avoid:  Repetitive, heavy activities or prolonged postures  Things that hurt or cause your hands to swell Direct pressure on sore joints  Specifics:  Do a warm soak every morning for 3-5 minutes (or a heating pad/hot shower, etc.). This loosens up stiff hands/wrists.  Start every day with a non-painful stretch routine assigned by your therapist.  Stretch again, once or twice that day, thinking about what you have planned.   It's smart to gently warmup hands and stretch before typically stressful activities (or before bed if your hands hurt in the night).  If you must do repetitive or stressful activities, use some type of support (arthritis gloves, Kinesio tape, braces, etc.).  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  Take breaks for rest and recovery; don't carry too many bags at once or "force" yourself to finish a job.   Keep hands covered and warm in the winter or cool, rainy weather.

## 2023-04-09 NOTE — Therapy (Signed)
 OUTPATIENT OCCUPATIONAL THERAPY TREATMENT NOTE   Patient Name: [name] MRN: [id number] DOB:February 17, 1945, 78 y.o., female Today's Date: 04/10/2023  PCP: Joya San MD REFERRING PROVIDER: Rodolph Bong, MD   END OF SESSION:  OT End of Session - 04/10/23 1055     Visit Number 2    Number of Visits 6    Date for OT Re-Evaluation 05/18/23    Authorization Type Medicare    OT Start Time 1058    OT Stop Time 1153    OT Time Calculation (min) 55 min    Equipment Utilized During Treatment Orthotic materials    Activity Tolerance Patient tolerated treatment well;No increased pain;Patient limited by pain;Patient limited by fatigue    Behavior During Therapy [facility] for tasks assessed/performed              Past Medical History:  Diagnosis Date   Allergy 1980's   Sulfa drugs and penecillin   Anxiety    Cataract 2021   Corrected   Depression    Headache    SINUS   Hyperlipidemia    Hypertension    Osteopenia    Osteopenia    Primary localized osteoarthritis of right knee    Past Surgical History:  Procedure Laterality Date   BREAST EXCISIONAL BIOPSY Right    benign cyst removal    CARPAL TUNNEL RELEASE Right 2015   Ortho in [place]   CHOLECYSTECTOMY N/A 02/08/2022   Procedure: LAPAROSCOPIC CHOLECYSTECTOMY WITH INTRAOPERATIVE CHOLANGIOGRAM;  Surgeon: Romie Levee, MD;  Location: [facility];  Service: General;  Laterality: N/A;   EYE SURGERY  12/2019   cataracts   JOINT REPLACEMENT  09/2015   Right knee   LASIK     MENISCUS REPAIR Right 2011   Orthopedic in [facility]   TOTAL KNEE ARTHROPLASTY Right 09/20/2015   TOTAL KNEE ARTHROPLASTY Right 09/20/2015   Procedure: TOTAL KNEE ARTHROPLASTY;  Surgeon: Salvatore Marvel, MD;  [facility];  Service: Orthopedics;  Laterality: Right;   Trigger Thumb Right 2015   Done in [place] at the same time as the carpal tunnel release   TUBAL LIGATION  1980   Patient Active Problem List   Diagnosis Date Noted   DOE (dyspnea on  exertion) 03/02/2023   Stage 3 chronic kidney disease (HCC) 03/02/2023   Primary osteoarthritis involving multiple joints 06/11/2022   Aortic atherosclerosis (HCC) 02/05/2022   Facial lesion 09/30/2021   Bilateral hand pain 09/30/2021   Impingement syndrome, shoulder, left 02/16/2021   Impingement syndrome, shoulder, right 09/07/2020   MDD (recurrent major depressive disorder) in remission (HCC) 09/02/2019   Generalized anxiety disorder 09/02/2019   Primary insomnia 08/16/2019   Post-menopausal 05/14/2017   Prediabetes 01/12/2017   Snoring 07/20/2016   Obsessive-compulsive personality trait 12/07/2015   Primary localized osteoarthritis of right knee    Essential hypertension 08/04/2015   Hyperlipidemia 08/04/2015   Osteopenia 08/04/2015    ONSET DATE: chronic onset   REFERRING DIAG:  M79.645,G89.29 (ICD-10-CM) - Chronic pain of left thumb  M79.641,M79.642 (ICD-10-CM) - Bilateral hand pain    THERAPY DIAG:  Pain in right hand  Muscle weakness (generalized)  Pain in left hand  Other lack of coordination  Stiffness of right hand, not elsewhere classified  Rationale for Evaluation and Treatment: Rehabilitation  PERTINENT HISTORY: per MD note: ""78 y.o. female who presents to [facility] today for bilat hand pain x several years, hx of trigger finger with surgical repair. She has been seen for this  issue by [facility]. Pt locates pain to the left thumb. Hx of surgery for right thumb. Pt is RHD. Gripping OK, decreased in the left hand. Short-term relief with Diclofenac. "  She states her right thumb has chronically had problems for which she had a surgery (trigger thumb release as well as CMC joint debridement seems likely).  She cannot recall the specific procedures that were done.  She now states her left thumb has new and increasing pain at the base of the [facility].  She also states her right middle finger is very painful and stiff at the DIP joint.   She would like some therapy for all of her arthritis issues and to help her have better function with her hands.Marland Kitchen  PRECAUTIONS: None;  RED FLAGS:  None   WEIGHT BEARING RESTRICTIONS: No    SUBJECTIVE:   SUBJECTIVE STATEMENT: She states her right thumb orthosis is working well, very comfortable and she wears it in the day.  She states the homework exercises are going well and that none cause any irritation for her.     PAIN:  Are you having pain? Yes: NPRS scale: 1-2/10 "low ache" up to 3-4/10 in past week at worst when opening jars, etc.  Pain location: Bilateral thumb CMC joints and right middle finger DIP joint Pain description: Aching and sore Aggravating factors: Gripping and forceful activities Relieving factors: Rest, ice, heat   PATIENT GOALS: To improve pain and arthritis symptoms in both hands  NEXT MD VISIT: As needed  OBJECTIVE: (All objective assessments below are from initial evaluation on: 04/02/23 unless otherwise specified.)    HAND DOMINANCE: Right   ADLs: Overall ADLs: States decreased ability to grab, hold household objects, pain and difficulty to open containers, perform FMS tasks (manipulate fasteners on clothing), mild to moderate bathing problems as well.    FUNCTIONAL OUTCOME MEASURES: Eval: Patient Specific Functional Scale: 4.6 (Opening jars, lifting household objects, gripping utensils)  (Higher Score  =  Better Ability for the Selected Tasks)      UPPER EXTREMITY ROM     Shoulder to Wrist AROM Right eval Left eval Rt / Lt  2/25/2  Forearm supination     Forearm pronation      Wrist flexion 72 71 75 /  63  Wrist extension 53 70 49 / 65  Wrist ulnar deviation     Wrist radial deviation     Functional dart thrower's motion (F-DTM) in ulnar flexion     F-DTM in radial extension      (Blank rows = not tested)   Hand AROM Right eval Left eval Rt / Lt 04/09/24  Full Fist Ability (or Gap to Distal Palmar Crease) full full full  Thumb  Opposition  (Kapandji Scale)    10/10 bil  Thumb MCP (0-60)     Thumb IP (0-80)     Thumb Radial Abduction Span 27* 37*  40 / 41  Thumb Palmar Abduction Span      Index MCP (0-90)      Index PIP (0-100)      Index DIP (0-70)       Long MCP (0-90)       Long PIP (0-100)       Long DIP (0-70)       Ring MCP (0-90)       Ring PIP (0-100)       Ring DIP (0-70)       Little MCP (0-90)  Little PIP (0-100)       Little DIP (0-70)       (Blank rows = not tested)   HAND FUNCTION: 04/10/23: Grip Rt: 25#; Lt: 25#  Eval: Observed weakness in affected bilateral hands.  Grip strength Right: 31 lbs, Left: 29 lbs   COORDINATION: Eval: Only mild observed coordination issues with both hands due to pain and stiffness  OBSERVATIONS:   Eval: Overt Heberden's nodes nodes at the DIP joints, tender to touch at MCPs, thumb CMC J's as well as these nodes.  Moderate collapse deformity or zigzag deformity in right thumb noted and this is also starting in the left thumb.  Overt pain and stiffness noted.  Left hand has apparent poor finger extension in the index and ring fingers after a trigger finger release many years ago (she has difficulty explaining her hand open and the longitudinal arch).    TODAY'S TREATMENT:  04/10/23: New active range of motion measures today show no significant improvements at the wrist, but bilateral thumb radial abduction is much improved.  OT reviews her home exercise program with her ensuring that she is doing wrist and thumb stretches appropriately without any pain, and also adds a left hand finger extension stretch to perform 4 times a day as well.  She understands this and tolerates it well.   Next, OT custom fabricates and nighttime left hand finger extension orthosis that she should wear every night on a very light stretch the whole night as tolerated.  If it is causing any irritation or pain she should take it off and bring it back for adjustments.  It fits well, has  no obvious areas of pressure, and she states feeling a light to medium stretch in hand and finger extension.   Lastly, OT reviews the key concepts of arthritis management with her including supportive braces as needed, modifying habits and routines as helpful, and also continuing on nonpainful stretch/exercise program.   She states understanding all directions today and leaves in no significant pain.   Exercises - Seated Wrist Flexion Stretch  - 3 x daily - 3 reps - 15 hold - Wrist Prayer Stretch  - 3 x daily - 3 reps - 15 sec hold - Stretch Thumb DOWNWARD  - 3 x daily - 3 reps - 15 sec hold - Thumb Webspace Stretch  - 3 x daily - 3 reps - 15 sec hold - Towel Roll Grip with Forearm in Neutral  - 3 x daily - 5 reps - 10 sec hold - PUSH KNUCKLES DOWN  - 4 x daily - 3-5 reps - 15 seconds hold   PATIENT EDUCATION: Education details: See tx section above for details  Person educated: Patient Education method: Verbal Instruction, Teach back, Handouts  Education comprehension: States and demonstrates understanding, Additional Education required    HOME EXERCISE PROGRAM: Access Code: [email or web address] URL: [email or web address] Date: 04/02/2023 Prepared by: Fannie Knee   GOALS: Goals reviewed with patient? Yes   SHORT TERM GOALS: (STG required if POC>30 days) Target Date: 04/20/2023  Pt will obtain protective, custom orthotic. Goal status: 04/10/2023: At least partially met, has right thumb brace, left hand extension brace, but may also need some type of daytime left thumb support.  2.  Pt will demo/state understanding of initial HEP to improve pain levels and prerequisite motion. Goal status: 04/10/2023: Met   LONG TERM GOALS: Target Date: 05/18/23  Pt will improve functional ability by decreased impairment per PSFS assessment from 4.6 to 7.5 or  better, for better quality of life. Goal status: INITIAL  2.  Pt will improve grip strength in bilateral hands from  approximately 30 lbs to at least 35 lbs for functional use at home and in IADLs. Goal status: INITIAL  3.  Pt will improve A/ROM in right thumb radial abduction from 27 degrees to at least 33 degrees, to have functional motion for tasks like reach and grasp.  Goal status: INITIAL  4.  Pt will decrease pain at worst from 6-7/10 to 2-3/10 or better to have better sleep and occupational participation in daily roles. Goal status: INITIAL   ASSESSMENT:  CLINICAL IMPRESSION: 04/10/23: Things are progressing well, the wrist was a bit tight today.  We did discuss that tightness/swelling/arthritis pain can defer day today.  She needs to adjust her management day today to match this.  New hand extension orthosis seems to fit well and hopefully that will help with finger extension over the next 2 to 3 weeks.  Eval: Patient is a 78 y.o. female who was seen today for occupational therapy evaluation for pain, stiffness, weakness in bilateral hands with the left being more painful than the right.  She also has lingering issues after a trigger finger release with inability to stretch open the longitudinal arch of the left hand.  She will benefit from outpatient occupational therapy to assist with all of these issues and to increase her quality of life.    PLAN:  OT FREQUENCY: 1x/week  OT DURATION: 6 weeks through 05/18/2023 and up to 6 total visits as needed medically  PLANNED INTERVENTIONS: 97168 OT Re-evaluation, 97535 self care/ADL training, 16109 therapeutic exercise, 97530 therapeutic activity, 97112 neuromuscular re-education, 97140 manual therapy, 97035 ultrasound, 97039 fluidotherapy, 97010 moist heat, 97010 cryotherapy, 97760 Orthotics management and training, 60454 Splinting (initial encounter), [id number] Subsequent splinting/medication, Dry needling, energy conservation, coping strategies training, patient/family education, and DME and/or AE instructions  CONSULTED AND AGREED WITH PLAN OF CARE:  Patient  PLAN FOR NEXT SESSION:   Check new nighttime extension orthosis as needed, consider daytime left thumb supportive bracing.  Continue with HEP, manual therapy and modalities as well as helpful.   Fannie Knee, OTR/L, CHT 04/10/2023, 12:11 PM

## 2023-04-10 ENCOUNTER — Ambulatory Visit (INDEPENDENT_AMBULATORY_CARE_PROVIDER_SITE_OTHER): Payer: Medicare Other | Admitting: Rehabilitative and Restorative Service Providers"

## 2023-04-10 ENCOUNTER — Encounter: Payer: Self-pay | Admitting: Rehabilitative and Restorative Service Providers"

## 2023-04-10 DIAGNOSIS — M25641 Stiffness of right hand, not elsewhere classified: Secondary | ICD-10-CM | POA: Diagnosis not present

## 2023-04-10 DIAGNOSIS — M79642 Pain in left hand: Secondary | ICD-10-CM

## 2023-04-10 DIAGNOSIS — M6281 Muscle weakness (generalized): Secondary | ICD-10-CM | POA: Diagnosis not present

## 2023-04-10 DIAGNOSIS — R278 Other lack of coordination: Secondary | ICD-10-CM | POA: Diagnosis not present

## 2023-04-10 DIAGNOSIS — M79641 Pain in right hand: Secondary | ICD-10-CM | POA: Diagnosis not present

## 2023-04-14 NOTE — Progress Notes (Deleted)
 BH MD Outpatient Progress Note  04/14/2023 8:08 AM [name]  MRN:  [id number]  Assessment:  [name] presents for follow-up evaluation. Today, 04/14/23, patient reports   --- stability of mood and anxiety and identifies significant benefit from CBT. She continues to experience chronic insomnia although not as severe as it has been in the past as she is now getting about 5-6 hours nightly on average. She is using Remeron PRN with some benefit and is amenable to further reduction in dose as more potent antihistaminergic effects at lower doses can facilitate sleep for some patients. No other changes to plan of care at this time.  RTC in 3 months by video.  Identifying Information: [name] is a 78 y.o. female with a history of MDD, anxiety, arthritis, HTN, CKD3B, and prediabetes who is an established patient with [facility] participating in follow-up via video conferencing.   Plan:  # GAD  MDD in remission Past medication trials: Remeron (appetite increase and weight gain); Klonopin Status of problem: stable *** Interventions: -- Continue Effexor 150 mg daily  # Insomnia, chronic Past medication trials: Restoril, Klonopin, Remeron (appetite increase and weight gain), doxepin (ineffective), trazodone (nausea, diarrhea, dizziness), Atarax ("jumpy"), gabapentin ("dopey"), doxylamine (ineffective) Status of problem: chronic Interventions: -- Continue Remeron 7.5-15 mg nightly PRN insomnia *** -- START Rozerem 8 mg nightly; to be taken 30 min prior to desired bedtime *** -- Continue Ambien 7.5 mg nightly              -- Risks, benefits, and side effects including but not limited to drowsiness and daytime sedation, dizziness, decreased mental alertness, falls and related injuries, increased risk for dementia was discussed              -- Have discussed plan for gradual taper and discontinuation of this medication over time given lack of  indication for long term use -- Patient seen by CBT-i therapist Nelwyn Salisbury LCSW at [facility] -- Sleep hygiene reviewed -- Sleep study 05/10/20: mildest OSA; CPAP not recommended; could consider dental device  Patient was given contact information for behavioral health clinic and was instructed to call 911 for emergencies.   Subjective:  Chief Complaint:  No chief complaint on file.   Interval History:   Adjustment to stressors Mood, anxiety, irritability sleep Rozerem trial Remeron 7.5-15   Visit Diagnosis:  No diagnosis found.  Past Psychiatric History:  Diagnoses: MDD with anxiety Medication trials: Restoril, Remeron (appetite increase and weight gain), doxepin (ineffective), trazodone (nausea, diarrhea, dizziness), melatonin, Atarax ("jumpy"), gabapentin ("dopey"), doxylamine (ineffective) Suicide attempts: denies Substance use: denies tobacco or illicit drug use; etoh: 1 glass/month  Past Medical History:  Past Medical History:  Diagnosis Date   Allergy 1980's   Sulfa drugs and penecillin   Anxiety    Cataract 2021   Corrected   Depression    Headache    SINUS   Hyperlipidemia    Hypertension    Osteopenia    Osteopenia    Primary localized osteoarthritis of right knee     Past Surgical History:  Procedure Laterality Date   BREAST EXCISIONAL BIOPSY Right    benign cyst removal    CARPAL TUNNEL RELEASE Right 2015   Ortho in [place]   CHOLECYSTECTOMY N/A 02/08/2022   Procedure: LAPAROSCOPIC CHOLECYSTECTOMY WITH INTRAOPERATIVE CHOLANGIOGRAM;  Surgeon: Romie Levee, MD;  Location: [facility] ORS;  Service: General;  Laterality: N/A;   EYE SURGERY  12/2019   cataracts  JOINT REPLACEMENT  09/2015   Right knee   LASIK     MENISCUS REPAIR Right 2011   Orthopedic in [facility]   TOTAL KNEE ARTHROPLASTY Right 09/20/2015   TOTAL KNEE ARTHROPLASTY Right 09/20/2015   Procedure: TOTAL KNEE ARTHROPLASTY;  Surgeon: Salvatore Marvel, MD;  Location[facility] OR;   Service: Orthopedics;  Laterality: Right;   Trigger Thumb Right 2015   Done in [facility] at the same time as the carpal tunnel release   TUBAL LIGATION  1980    Family Psychiatric History:  Mothe: anxiety Maternal aunt: alcohol use, anorexia   Family History:  Family History  Problem Relation Age of Onset   Heart disease Mother    Arthritis Mother    Heart failure Mother    Heart attack Mother    Anxiety disorder Mother    Depression Mother    Hearing loss Mother    Varicose Veins Mother    Early death Father    Heart attack Father    Hearing loss Father    Heart disease Father    Hyperlipidemia Sister    Heart disease Sister    Hearing loss Sister    Arthritis Sister    Irregular heart beat Sister    Hypertension Sister    Hypertension Brother    Hyperlipidemia Brother    Heart attack Brother    Asthma Brother    Alcohol abuse Brother    Heart disease Brother    Early death Brother    Heart attack Brother    Depression Son    Alcohol abuse Maternal Aunt     Social History:  Social History   Socioeconomic History   Marital status: Married    Spouse name: Lawrence   Number of children: 1   Years of education: 17.5   Highest education level: Master's degree (e.g., MA, MS, MEng, MEd, MSW, MBA)  Occupational History   Occupation: retired    Comment: Comptroller  Tobacco Use   Smoking status: Never   Smokeless tobacco: Never  Vaping Use   Vaping status: Never Used  Substance and Sexual Activity   Alcohol use: Not Currently    Comment: 1 glass a month   Drug use: Yes    Types: Benzodiazepines   Sexual activity: Yes    Birth control/protection: Post-menopausal, None  Other Topics Concern   Not on file  Social History Narrative   Reads paper, walks, housework errands. Caffeine use daily.   3 grands   Social Drivers of Corporate investment banker Strain: Low Risk  (12/11/2022)   Overall Financial Resource Strain (CARDIA)    Difficulty of Paying Living  Expenses: Not hard at all  Food Insecurity: No Food Insecurity (12/11/2022)   Hunger Vital Sign    Worried About Running Out of Food in the Last Year: Never true    Ran Out of Food in the Last Year: Never true  Transportation Needs: No Transportation Needs (12/11/2022)   PRAPARE - Administrator, Civil Service (Medical): No    Lack of Transportation (Non-Medical): No  Physical Activity: Insufficiently Active (12/11/2022)   Exercise Vital Sign    Days of Exercise per Week: 2 days    Minutes of Exercise per Session: 20 min  Stress: Stress Concern Present (12/11/2022)   [employer] of Occupational Health - Occupational Stress Questionnaire    Feeling of Stress : Rather much  Social Connections: Moderately Isolated (12/11/2022)   Social Connection and Isolation Panel [NHANES]  Frequency of Communication with Friends and Family: Once a week    Frequency of Social Gatherings with Friends and Family: Once a week    Attends Religious Services: Never    Database administrator or Organizations: No    Attends Engineer, structural: More than 4 times per year    Marital Status: Married    Allergies:  Allergies  Allergen Reactions   Penicillins Hives    Tolerates Cephalosporins    Prednisone Other (See Comments)    Hyperactivity, sleep disturbance, confusion   Sulfa Antibiotics Hives         Current Medications: Current Outpatient Medications  Medication Sig Dispense Refill   amLODipine-benazepril (LOTREL) 5-20 MG capsule Take 1 capsule by mouth daily. 90 capsule 3   atorvastatin (LIPITOR) 20 MG tablet Take 1 tablet (20 mg total) by mouth daily. 90 tablet 3   CALCIUM PO Take 1,200 mg by mouth daily.     fluticasone (FLONASE) 50 MCG/ACT nasal spray Place 1 spray into both nostrils daily as needed for allergies or rhinitis.     lidocaine (XYLOCAINE) 2 % solution Use as directed 10 mLs in the mouth or throat every 6 (six) hours as needed for mouth pain. 100 mL  3   mirtazapine (REMERON) 7.5 MG tablet Take 1-2 tablets (7.5-15 mg total) by mouth at bedtime as needed (sleep). 60 tablet 2   Multiple Vitamin (MULTIVITAMIN ADULT PO) Take 1 tablet by mouth daily.     raloxifene (EVISTA) 60 MG tablet Take 1 tablet (60 mg total) by mouth daily. 90 tablet 3   triamterene-hydrochlorothiazide (DYAZIDE) 37.5-25 MG capsule Take 1 each (1 capsule total) by mouth daily. 90 capsule 3   venlafaxine XR (EFFEXOR-XR) 150 MG 24 hr capsule Take 1 capsule (150 mg total) by mouth daily with breakfast. 90 capsule 1   zolpidem (AMBIEN) 5 MG tablet Take 1.5 tablets (7.5 mg total) by mouth at bedtime as needed for sleep. 45 tablet 2   No current facility-administered medications for this visit.    ROS: Reports intentional weight loss  Objective:  Psychiatric Specialty Exam: There were no vitals taken for this visit.There is no height or weight on file to calculate BMI.  General Appearance: Casual, Neat, and Well Groomed  Eye Contact:  Good  Speech:  Clear and Coherent and Normal Rate  Volume:  Normal  Mood:   "pretty good"  Affect:   Euthymic; calm  Thought Content:  Denies AVH; IOR; paranoia    Suicidal Thoughts:  No  Homicidal Thoughts:  No  Thought Process:  Goal Directed and Linear  Orientation:  Full (Time, Place, and Person)    Memory:   Grossly intact  Judgment:  Good  Insight:  Good  Concentration:  Concentration: Good  Recall:  NA  Fund of Knowledge: Good  Language: Good  Psychomotor Activity:  Normal  Akathisia:  No  AIMS (if indicated): not done  Assets:  Communication Skills Desire for Improvement Financial Resources/Insurance Housing Intimacy Leisure Time Physical Health Resilience Social Support Talents/Skills Transportation  ADL's:  Intact  Cognition: WNL  Sleep:   fair however with chronic insomnia   PE: General: sits comfortably in view of camera; no acute distress  Pulm: no increased work of breathing on room air  MSK: all  extremity movements appear intact  Neuro: no focal neurological deficits observed  Gait & Station: unable to assess by video   Metabolic Disorder Labs: Lab Results  Component Value Date  HGBA1C 6.0 04/17/2022   MPG 131 01/17/2019   MPG 128 11/28/2017   No results found for: "PROLACTIN" Lab Results  Component Value Date   CHOL 172 04/17/2022   TRIG 123.0 04/17/2022   HDL 69.80 04/17/2022   CHOLHDL 2 04/17/2022   VLDL 24.6 04/17/2022   LDLCALC 77 04/17/2022   LDLCALC 94 06/30/2021   Lab Results  Component Value Date   TSH 3.07 04/17/2022   TSH 4.01 01/08/2017    Therapeutic Level Labs: No results found for: "LITHIUM" No results found for: "VALPROATE" No results found for: "CBMZ"  Screenings:  GAD-7    Flowsheet Row Office Visit from 11/15/2022 in [facility] [employer] from 09/27/2022 in [facility] [employer] from 06/09/2022 in [facility] [employer] Visit from 04/17/2022 in [facility] [place] Video Visit from 03/17/2022 in [facility]  Total GAD-7 Score 7 8 16 18 9       PHQ2-9    Flowsheet Row Office Visit from 11/15/2022 in [facility] [place] Office Visit from 09/27/2022 in [facility] [place] Clinical Support from 08/08/2022 in [facility] [employer] Visit from 06/09/2022 in [facility] [employer] Visit from 04/17/2022 in [facility] [place]  PHQ-2 Total Score 2 2 2 2 4   PHQ-9 Total Score 5 10 6 11 15       Flowsheet Row ED from 12/25/2022 in [facility] at [facility] ED from 12/04/2022 in [facility] at [facility] ED to Hosp-Admission (Discharged) from 02/05/2022 in [facility] 5 EAST  MEDICAL UNIT  C-SSRS RISK CATEGORY No Risk No Risk No Risk       Collaboration of Care: Collaboration of Care: Medication Management AEB ongoing medication management and Psychiatrist AEB established with this provider  Patient/Guardian was advised Release of Information must be obtained prior to any record release in order to collaborate their care with an outside provider. Patient/Guardian was advised if they have not already done so to contact the registration department to sign all necessary forms in order for [place] to release information regarding their care.   Consent: Patient/Guardian gives verbal consent for treatment and assignment of benefits for services provided during this visit. Patient/Guardian expressed understanding and agreed to proceed.   Virtual Visit via Video Note  I connected with [name] on 04/14/23 at 11:00 AM EST by a video enabled telemedicine application and verified that I am speaking with the correct person using two identifiers.  Location: Patient: home address in [place] Provider: remote office in [place]   I discussed the limitations of evaluation and management by telemedicine and the availability of in person appointments. The patient expressed understanding and agreed to proceed.   I discussed the assessment and treatment plan with the patient. The patient was provided an opportunity to ask questions and all were answered. The patient agreed with the plan and demonstrated an understanding of the instructions.   The patient was advised to call back or seek an in-person evaluation if the symptoms worsen or if the condition fails to improve as anticipated.  I provided *** minutes dedicated to the care of this patient via video on the date of this encounter to include  chart review, face-to-face time with the patient, medication management/counseling, and documentation.  Francis Yardley A Solyana Nonaka 04/14/2023, 8:08 AM

## 2023-04-17 ENCOUNTER — Encounter: Payer: Medicare Other | Admitting: Rehabilitative and Restorative Service Providers"

## 2023-04-18 ENCOUNTER — Telehealth (HOSPITAL_COMMUNITY): Payer: Medicare Other | Admitting: Psychiatry

## 2023-04-18 ENCOUNTER — Encounter (HOSPITAL_COMMUNITY): Payer: Self-pay

## 2023-04-23 NOTE — Therapy (Addendum)
 OUTPATIENT OCCUPATIONAL THERAPY TREATMENT & DISCHARGE NOTE   Patient Name: [name] MRN: [id number] DOB:1945-03-03, 78 y.o., female Today's Date: 04/24/2023  PCP: Wendolyn FELIX MD REFERRING PROVIDER: Joane Artist RAMAN, MD                            OCCUPATIONAL THERAPY DISCHARGE SUMMARY  Visits from Start of Care: 3  Pt did not return to OT and goals could not be finalized.  The patient  is officially D/C therapy today per protocols. See note for additional details.   Melvenia Ada, OTR/L, CHT 12/14/23                  END OF SESSION:  OT End of Session - 04/24/23 1100     Visit Number 3    Number of Visits 6    Date for OT Re-Evaluation 05/18/23    Authorization Type Medicare    OT Start Time 1103    OT Stop Time 1141    OT Time Calculation (min) 38 min    Equipment Utilized During Treatment Orthotic materials    Activity Tolerance Patient tolerated treatment well;No increased pain;Patient limited by pain;Patient limited by fatigue    Behavior During Therapy [facility] for tasks assessed/performed               Past Medical History:  Diagnosis Date   Allergy 1980's   Sulfa drugs and penecillin   Anxiety    Cataract 2021   Corrected   Depression    Headache    SINUS   Hyperlipidemia    Hypertension    Osteopenia    Osteopenia    Primary localized osteoarthritis of right knee    Past Surgical History:  Procedure Laterality Date   BREAST EXCISIONAL BIOPSY Right    benign cyst removal    CARPAL TUNNEL RELEASE Right 2015   Ortho in [place]   CHOLECYSTECTOMY N/A 02/08/2022   Procedure: LAPAROSCOPIC CHOLECYSTECTOMY WITH INTRAOPERATIVE CHOLANGIOGRAM;  Surgeon: Debby Hila, MD;  Location: [facility];  Service: General;  Laterality: N/A;   EYE SURGERY  12/2019   cataracts   JOINT REPLACEMENT  09/2015   Right knee   LASIK     MENISCUS REPAIR Right 2011   Orthopedic in [facility]   TOTAL KNEE ARTHROPLASTY Right 09/20/2015    TOTAL KNEE ARTHROPLASTY Right 09/20/2015   Procedure: TOTAL KNEE ARTHROPLASTY;  Surgeon: Lamar Millman, MD;  Location: [facility] OR;  Service: Orthopedics;  Laterality: Right;   Trigger Thumb Right 2015   Done in [place] at the same time as the carpal tunnel release   TUBAL LIGATION  1980   Patient Active Problem List   Diagnosis Date Noted   DOE (dyspnea on exertion) 03/02/2023   Stage 3 chronic kidney disease (HCC) 03/02/2023   Primary osteoarthritis involving multiple joints 06/11/2022   Aortic atherosclerosis (HCC) 02/05/2022   Facial lesion 09/30/2021   Bilateral hand pain 09/30/2021   Impingement syndrome, shoulder, left 02/16/2021   Impingement syndrome, shoulder, right 09/07/2020   MDD (recurrent major depressive disorder) in remission (HCC) 09/02/2019   Generalized anxiety disorder 09/02/2019   Primary insomnia 08/16/2019   Post-menopausal 05/14/2017   Prediabetes 01/12/2017   Snoring 07/20/2016   Obsessive-compulsive personality trait 12/07/2015   Primary localized osteoarthritis of right knee    Essential hypertension 08/04/2015   Hyperlipidemia 08/04/2015   Osteopenia 08/04/2015    ONSET DATE: chronic onset   REFERRING DIAG:  M79.645,G89.29 (  ICD-10-CM) - Chronic pain of left thumb  M79.641,M79.642 (ICD-10-CM) - Bilateral hand pain    THERAPY DIAG:  Pain in right hand  Muscle weakness (generalized)  Pain in left hand  Other lack of coordination  Stiffness of right hand, not elsewhere classified  Rationale for Evaluation and Treatment: Rehabilitation  PERTINENT HISTORY: per MD note: 78 y.o. female who presents to [facility] today for bilat hand pain x several years, hx of trigger finger with surgical repair. She has been seen for this issue by [facility]. Pt locates pain to the left thumb. Hx of surgery for right thumb. Pt is RHD. Gripping OK, decreased in the left hand. Short-term relief with Diclofenac .   She states her  right thumb has chronically had problems for which she had a surgery (trigger thumb release as well as CMC joint debridement seems likely).  She cannot recall the specific procedures that were done.  She now states her left thumb has new and increasing pain at the base of the [facility].  She also states her right middle finger is very painful and stiff at the DIP joint.  She would like some therapy for all of her arthritis issues and to help her have better function with her hands.SABRA  PRECAUTIONS: None;  RED FLAGS:  None   WEIGHT BEARING RESTRICTIONS: No    SUBJECTIVE:   SUBJECTIVE STATEMENT: She states she thinks she is doing better.  Her nighttime orthosis has not been painful and is helping to stretch her finger straight.  She does states some increased soreness in the left thumb recently.   PAIN:  Are you having pain? Yes: NPRS scale:  2-3/10 low ache up to 3-4/10 in past week at worst when opening jars, etc.  Pain location: Bilateral thumb CMC joints and right middle finger DIP joint Pain description: Aching and sore Aggravating factors: Gripping and forceful activities Relieving factors: Rest, ice, heat   PATIENT GOALS: To improve pain and arthritis symptoms in both hands  NEXT MD VISIT: As needed  OBJECTIVE: (All objective assessments below are from initial evaluation on: 04/02/23 unless otherwise specified.)    HAND DOMINANCE: Right   ADLs: Overall ADLs: States decreased ability to grab, hold household objects, pain and difficulty to open containers, perform FMS tasks (manipulate fasteners on clothing), mild to moderate bathing problems as well.    FUNCTIONAL OUTCOME MEASURES: Eval: Patient Specific Functional Scale: 4.6 (Opening jars, lifting household objects, gripping utensils)  (Higher Score  =  Better Ability for the Selected Tasks)      UPPER EXTREMITY ROM     Shoulder to Wrist AROM Right eval Left eval Rt / Lt  2/25/2 Rt  / Lt 04/24/23  Forearm supination       Forearm pronation       Wrist flexion 72 71 75 /  63 74/ 61  Wrist extension 53 70 49 / 65 57 / 68  Wrist ulnar deviation      Wrist radial deviation      Functional dart thrower's motion (F-DTM) in ulnar flexion      F-DTM in radial extension       (Blank rows = not tested)   Hand AROM Right eval Left eval Rt / Lt 04/09/24  Full Fist Ability (or Gap to Distal Palmar Crease) full full full  Thumb Opposition  (Kapandji Scale)    10/10 bil  Thumb MCP (0-60)     Thumb IP (0-80)  Thumb Radial Abduction Span 27* 37*  40 / 41  Thumb Palmar Abduction Span      Index MCP (0-90)      Index PIP (0-100)      Index DIP (0-70)       Long MCP (0-90)       Long PIP (0-100)       Long DIP (0-70)       Ring MCP (0-90)       Ring PIP (0-100)       Ring DIP (0-70)       Little MCP (0-90)       Little PIP (0-100)       Little DIP (0-70)       (Blank rows = not tested)   HAND FUNCTION: 04/24/23: Grip Rt: 32.7#; Lt: 22#   04/10/23: Grip Rt: 25#; Lt: 25#  Eval: Observed weakness in affected bilateral hands.  Grip strength Right: 31 lbs, Left: 29 lbs   COORDINATION: Eval: Only mild observed coordination issues with both hands due to pain and stiffness  OBSERVATIONS:   Eval: Overt Heberden's nodes nodes at the DIP joints, tender to touch at MCPs, thumb CMC J's as well as these nodes.  Moderate collapse deformity or zigzag deformity in right thumb noted and this is also starting in the left thumb.  Overt pain and stiffness noted.  Left hand has apparent poor finger extension in the index and ring fingers after a trigger finger release many years ago (she has difficulty explaining her hand open and the longitudinal arch).    TODAY'S TREATMENT:  04/24/23: She starts with range of motion for exercise as well as new measures which shows improvements still in the right and left wrists, right grip strength is also improving though left grip strength is somewhat worse and painful today.  This  could be due to the fact that right hand has a supportive brace but left thumb does not, so OT decides to make custom fabricated left hand-based thumb spica orthosis with the IP joint free to help support the sore CMC joint.  It fit well, had no areas of pressure or pain, she states it supports the thumb well.  She should wear it during the day with activities as tolerated, but wear the night time hand extension orthosis at night in the left hand.  The blue thumb brace she should continue to wear as needed in the right hand with activity during the day.  We also review her home exercise program, she seems to be doing well, tolerating, understanding at home, etc.    Exercises performed/reviewed today: - Seated Wrist Flexion Stretch  - 3 x daily - 3 reps - 15 hold - Wrist Prayer Stretch  - 3 x daily - 3 reps - 15 sec hold - Stretch Thumb DOWNWARD  - 3 x daily - 3 reps - 15 sec hold - Thumb Webspace Stretch  - 3 x daily - 3 reps - 15 sec hold - Towel Roll Grip with Forearm in Neutral  - 3 x daily - 5 reps - 10 sec hold - PUSH KNUCKLES DOWN  - 4 x daily - 3-5 reps - 15 seconds hold   PATIENT EDUCATION: Education details: See tx section above for details  Person educated: Patient Education method: Verbal Instruction, Teach back, Handouts  Education comprehension: States and demonstrates understanding, Additional Education required    HOME EXERCISE PROGRAM: Access Code: [email or web address] URL: [email or web address] Date: 04/02/2023 Prepared by: Melvenia Ada  GOALS: Goals reviewed with patient? Yes   SHORT TERM GOALS: (STG required if POC>30 days) Target Date: 04/20/2023  Pt will obtain protective, custom orthotic. Goal status: 04/10/2023: At least partially met, has right thumb brace, left hand extension brace, but may also need some type of daytime left thumb support.  2.  Pt will demo/state understanding of initial HEP to improve pain levels and prerequisite motion. Goal status:  04/10/2023: Met   LONG TERM GOALS: Target Date: 05/18/23  Pt will improve functional ability by decreased impairment per PSFS assessment from 4.6 to 7.5 or better, for better quality of life. Goal status: INITIAL  2.  Pt will improve grip strength in bilateral hands from approximately 30 lbs to at least 35 lbs for functional use at home and in IADLs. Goal status: INITIAL  3.  Pt will improve A/ROM in right thumb radial abduction from 27 degrees to at least 33 degrees, to have functional motion for tasks like reach and grasp.  Goal status: INITIAL  4.  Pt will decrease pain at worst from 6-7/10 to 2-3/10 or better to have better sleep and occupational participation in daily roles. Goal status: INITIAL   ASSESSMENT:  CLINICAL IMPRESSION: 04/24/23: She now has thumb supports for both thumbs, nighttime finger extension orthosis for the left hand.  She also has a good active HEP for arthritis management, and she is doing well with that.  As soon as she feels like she is self managing her symptoms well, we can discharge therapy  04/10/23: Things are progressing well, the wrist was a bit tight today.  We did discuss that tightness/swelling/arthritis pain can defer day today.  She needs to adjust her management day today to match this.  New hand extension orthosis seems to fit well and hopefully that will help with finger extension over the next 2 to 3 weeks.  Eval: Patient is a 78 y.o. female who was seen today for occupational therapy evaluation for pain, stiffness, weakness in bilateral hands with the left being more painful than the right.  She also has lingering issues after a trigger finger release with inability to stretch open the longitudinal arch of the left hand.  She will benefit from outpatient occupational therapy to assist with all of these issues and to increase her quality of life.    PLAN:  OT FREQUENCY: 1x/week  OT DURATION: 6 weeks through 05/18/2023 and up to 6 total visits as  needed medically  PLANNED INTERVENTIONS: 97168 OT Re-evaluation, 97535 self care/ADL training, 02889 therapeutic exercise, 97530 therapeutic activity, 97112 neuromuscular re-education, 97140 manual therapy, 97035 ultrasound, 97039 fluidotherapy, 97010 moist heat, 97010 cryotherapy, 97760 Orthotics management and training, 02239 Splinting (initial encounter), [id number] Subsequent splinting/medication, Dry needling, energy conservation, coping strategies training, patient/family education, and DME and/or AE instructions  CONSULTED AND AGREED WITH PLAN OF CARE: Patient  PLAN FOR NEXT SESSION:   Check new thumb orthosis on the left, help problem solve any additional issues and check HEP.  Consider checking goals and discharge whenever appropriate.   Melvenia Ada, OTR/L, CHT 04/24/2023, 12:57 PM

## 2023-04-24 ENCOUNTER — Encounter: Payer: Self-pay | Admitting: Rehabilitative and Restorative Service Providers"

## 2023-04-24 ENCOUNTER — Ambulatory Visit (INDEPENDENT_AMBULATORY_CARE_PROVIDER_SITE_OTHER): Payer: Medicare Other | Admitting: Rehabilitative and Restorative Service Providers"

## 2023-04-24 DIAGNOSIS — M79642 Pain in left hand: Secondary | ICD-10-CM | POA: Diagnosis not present

## 2023-04-24 DIAGNOSIS — M6281 Muscle weakness (generalized): Secondary | ICD-10-CM | POA: Diagnosis not present

## 2023-04-24 DIAGNOSIS — M25641 Stiffness of right hand, not elsewhere classified: Secondary | ICD-10-CM

## 2023-04-24 DIAGNOSIS — R278 Other lack of coordination: Secondary | ICD-10-CM | POA: Diagnosis not present

## 2023-04-24 DIAGNOSIS — M79641 Pain in right hand: Secondary | ICD-10-CM | POA: Diagnosis not present

## 2023-05-01 ENCOUNTER — Other Ambulatory Visit (HOSPITAL_COMMUNITY): Payer: Self-pay

## 2023-05-01 ENCOUNTER — Encounter: Payer: Medicare Other | Admitting: Rehabilitative and Restorative Service Providers"

## 2023-05-02 ENCOUNTER — Other Ambulatory Visit: Payer: Self-pay

## 2023-05-02 ENCOUNTER — Ambulatory Visit: Payer: Medicare Other | Admitting: Family Medicine

## 2023-05-16 ENCOUNTER — Ambulatory Visit: Payer: Medicare Other | Admitting: Family Medicine

## 2023-05-16 ENCOUNTER — Encounter: Payer: Self-pay | Admitting: Family Medicine

## 2023-05-16 NOTE — Progress Notes (Signed)
 Subjective:     Patient ID: [name], female    DOB: April 09, 1945, 78 y.o.   MRN: [id number]  Chief Complaint  Patient presents with   Medical Management of Chronic Issues    6 month follow-up  Fasting     HPI Pt left hearing aids at home so can't hear.  I am sick so I can't remove my mask.  She was happy to reschedule.   There are no preventive care reminders to display for this patient.  Past Medical History:  Diagnosis Date   Allergy 1980's   Sulfa drugs and penecillin   Anxiety    Cataract 2021   Corrected   Chronic kidney disease 2024   Stage 3   Depression    Headache    SINUS   Hyperlipidemia    Hypertension    Osteopenia    Osteopenia    Primary localized osteoarthritis of right knee     Past Surgical History:  Procedure Laterality Date   BREAST EXCISIONAL BIOPSY Right    benign cyst removal    CARPAL TUNNEL RELEASE Right 2015   Ortho in [place]   CHOLECYSTECTOMY N/A 02/08/2022   Procedure: LAPAROSCOPIC CHOLECYSTECTOMY WITH INTRAOPERATIVE CHOLANGIOGRAM;  Surgeon: Romie Levee, MD;  Location: [facility];  Service: General;  Laterality: N/A;   EYE SURGERY  12/2019   cataracts   FRACTURE SURGERY  2016   Rt.  leg   JOINT REPLACEMENT  09/2015   Right knee   LASIK     MENISCUS REPAIR Right 2011   Orthopedic in [facility]   TOTAL KNEE ARTHROPLASTY Right 09/20/2015   TOTAL KNEE ARTHROPLASTY Right 09/20/2015   Procedure: TOTAL KNEE ARTHROPLASTY;  Surgeon: Salvatore Marvel, MD;  Location: [facility] OR;  Service: Orthopedics;  Laterality: Right;   Trigger Thumb Right 2015   Done in [facility] at the same time as the carpal tunnel release   TUBAL LIGATION  1980     Current Outpatient Medications:    amLODipine-benazepril (LOTREL) 5-20 MG capsule, Take 1 capsule by mouth daily., Disp: 90 capsule, Rfl: 3   atorvastatin (LIPITOR) 20 MG tablet, Take 1 tablet (20 mg total) by mouth daily., Disp: 90 tablet, Rfl: 3   CALCIUM PO, Take 1,200 mg by mouth daily., Disp: ,  Rfl:    fluticasone (FLONASE) 50 MCG/ACT nasal spray, Place 1 spray into both nostrils daily as needed for allergies or rhinitis., Disp: , Rfl:    lidocaine (XYLOCAINE) 2 % solution, Use as directed 10 mLs in the mouth or throat every 6 (six) hours as needed for mouth pain., Disp: 100 mL, Rfl: 3   mirtazapine (REMERON) 7.5 MG tablet, Take 1-2 tablets (7.5-15 mg total) by mouth at bedtime as needed (sleep)., Disp: 60 tablet, Rfl: 2   Multiple Vitamin (MULTIVITAMIN ADULT PO), Take 1 tablet by mouth daily., Disp: , Rfl:    raloxifene (EVISTA) 60 MG tablet, Take 1 tablet (60 mg total) by mouth daily., Disp: 90 tablet, Rfl: 3   triamterene-hydrochlorothiazide (DYAZIDE) 37.5-25 MG capsule, Take 1 each (1 capsule total) by mouth daily., Disp: 90 capsule, Rfl: 3   venlafaxine XR (EFFEXOR-XR) 150 MG 24 hr capsule, Take 1 capsule (150 mg total) by mouth daily with breakfast., Disp: 90 capsule, Rfl: 1   zolpidem (AMBIEN) 5 MG tablet, Take 1.5 tablets (7.5 mg total) by mouth at bedtime as needed for sleep., Disp: 45 tablet, Rfl: 2  Allergies  Allergen Reactions   Penicillins Hives    Tolerates Cephalosporins  Prednisone Other (See Comments)    Hyperactivity, sleep disturbance, confusion   Sulfa Antibiotics Hives        ROS neg/noncontributory except as noted HPI/below      Objective:     BP 129/75   Pulse 93   Temp (!) 97.2 F (36.2 C) (Temporal)   Resp 18   Ht 5\' 2"  (1.575 m)   Wt 181 lb 8 oz (82.3 kg)   SpO2 99%   BMI 33.20 kg/m  Wt Readings from Last 3 Encounters:  05/16/23 181 lb 8 oz (82.3 kg)  03/21/23 190 lb (86.2 kg)  03/13/23 183 lb (83 kg)    Physical Exam        Assessment & Plan:  Erroneous encounter - disregard    No follow-ups on file.  Angelena Sole, MD

## 2023-05-18 ENCOUNTER — Ambulatory Visit: Admitting: Family Medicine

## 2023-05-24 DIAGNOSIS — N200 Calculus of kidney: Secondary | ICD-10-CM | POA: Diagnosis not present

## 2023-05-24 DIAGNOSIS — N1831 Chronic kidney disease, stage 3a: Secondary | ICD-10-CM | POA: Diagnosis not present

## 2023-05-24 DIAGNOSIS — I129 Hypertensive chronic kidney disease with stage 1 through stage 4 chronic kidney disease, or unspecified chronic kidney disease: Secondary | ICD-10-CM | POA: Diagnosis not present

## 2023-05-24 DIAGNOSIS — E785 Hyperlipidemia, unspecified: Secondary | ICD-10-CM | POA: Diagnosis not present

## 2023-05-28 ENCOUNTER — Other Ambulatory Visit (HOSPITAL_COMMUNITY): Payer: Self-pay

## 2023-05-28 ENCOUNTER — Encounter: Payer: Self-pay | Admitting: Family Medicine

## 2023-05-28 ENCOUNTER — Ambulatory Visit (INDEPENDENT_AMBULATORY_CARE_PROVIDER_SITE_OTHER): Admitting: Family Medicine

## 2023-05-28 VITALS — BP 117/64 | HR 104 | Temp 97.7°F | Ht 62.0 in | Wt 183.2 lb

## 2023-05-28 DIAGNOSIS — J019 Acute sinusitis, unspecified: Secondary | ICD-10-CM | POA: Diagnosis not present

## 2023-05-28 DIAGNOSIS — E7800 Pure hypercholesterolemia, unspecified: Secondary | ICD-10-CM

## 2023-05-28 DIAGNOSIS — F411 Generalized anxiety disorder: Secondary | ICD-10-CM

## 2023-05-28 DIAGNOSIS — K219 Gastro-esophageal reflux disease without esophagitis: Secondary | ICD-10-CM | POA: Diagnosis not present

## 2023-05-28 DIAGNOSIS — R7303 Prediabetes: Secondary | ICD-10-CM | POA: Diagnosis not present

## 2023-05-28 DIAGNOSIS — I10 Essential (primary) hypertension: Secondary | ICD-10-CM

## 2023-05-28 DIAGNOSIS — B9689 Other specified bacterial agents as the cause of diseases classified elsewhere: Secondary | ICD-10-CM | POA: Diagnosis not present

## 2023-05-28 DIAGNOSIS — M8580 Other specified disorders of bone density and structure, unspecified site: Secondary | ICD-10-CM

## 2023-05-28 MED ORDER — AMLODIPINE BESY-BENAZEPRIL HCL 5-20 MG PO CAPS
1.0000 | ORAL_CAPSULE | Freq: Every day | ORAL | 3 refills | Status: DC
  Filled 2023-05-28 – 2023-07-31 (×2): qty 90, 90d supply, fill #0
  Filled 2023-10-22: qty 90, 90d supply, fill #1
  Filled 2024-01-20: qty 90, 90d supply, fill #2
  Filled 2024-03-17: qty 90, 90d supply, fill #3

## 2023-05-28 MED ORDER — OMEPRAZOLE 40 MG PO CPDR
40.0000 mg | DELAYED_RELEASE_CAPSULE | Freq: Every day | ORAL | 3 refills | Status: DC
  Filled 2023-05-28: qty 30, 30d supply, fill #0
  Filled 2023-06-20: qty 30, 30d supply, fill #1
  Filled 2023-07-20: qty 30, 30d supply, fill #2
  Filled 2023-08-20: qty 30, 30d supply, fill #3

## 2023-05-28 MED ORDER — RALOXIFENE HCL 60 MG PO TABS
60.0000 mg | ORAL_TABLET | Freq: Every day | ORAL | 3 refills | Status: AC
  Filled 2023-05-28 – 2023-06-28 (×2): qty 90, 90d supply, fill #0
  Filled 2023-09-29: qty 90, 90d supply, fill #1
  Filled 2023-12-25: qty 90, 90d supply, fill #2

## 2023-05-28 MED ORDER — TRIAMTERENE-HCTZ 37.5-25 MG PO CAPS
1.0000 | ORAL_CAPSULE | Freq: Every day | ORAL | 3 refills | Status: DC
  Filled 2023-05-28 – 2023-06-30 (×2): qty 90, 90d supply, fill #0
  Filled 2023-09-30: qty 90, 90d supply, fill #1

## 2023-05-28 MED ORDER — ATORVASTATIN CALCIUM 20 MG PO TABS
20.0000 mg | ORAL_TABLET | Freq: Every day | ORAL | 3 refills | Status: AC
  Filled 2023-05-28 – 2023-06-30 (×2): qty 90, 90d supply, fill #0
  Filled 2023-09-30: qty 90, 90d supply, fill #1
  Filled 2023-12-31: qty 90, 90d supply, fill #2

## 2023-05-28 MED ORDER — CEPHALEXIN 500 MG PO CAPS
500.0000 mg | ORAL_CAPSULE | Freq: Two times a day (BID) | ORAL | 0 refills | Status: DC
  Filled 2023-05-28: qty 20, 10d supply, fill #0

## 2023-05-28 NOTE — Progress Notes (Signed)
 Subjective:     Patient ID: [name], female    DOB: 06/24/1945, 78 y.o.   MRN: [id number]  Chief Complaint  Patient presents with   Hypertension   HPI   Pre DM - Pt is working on diet/exercise  HTN - Pt is on amlodipine/benazepril 5-20 mg daily. Bp's running 120s/60s.  No ha/dizziness/cp/palp/edema/cough/sob.  Saw neph for elevated creat-did a lot of labs per pt and told ok.  Saw last wk   HLD - Pt is on Lipitor 20 mg daily. No SE  Discussed the use of AI scribe software for clinical note transcription with the patient, who gave verbal consent to proceed.  History of Present Illness [name] is a 78 year old female who presents with sinus pressure and headache.  She has been experiencing sinus pressure and a nagging headache for the past eight to ten days. She has a history of sinus issues and typically manages them by waiting for symptoms to resolve, but this episode has persisted longer than usual. Symptoms include chills and sweats, but no runny nose, congestion, or fever. She describes her nasal passages as 'very dry.' Keflex has been effective in treating similar symptoms in the past.  She reports gastrointestinal symptoms including increased acid, indigestion, and diarrhea. She has not been taking any medication for these symptoms. No food getting stuck or dark tarry stools.  She is currently taking amlodipine-benazepril 5/20 mg and Dyazide for blood pressure management, with readings typically around 120/70 mmHg. No headaches, dizziness, chest pain, or shortness of breath related to her blood pressure.  She takes atorvastatin 20 mg for cholesterol management and reports no issues with this medication.  For osteoporosis, she has been on Evista 60 mg for over ten years. Her last bone density test was in 2023.  She experiences mood fluctuations and insomnia, which she manages as best as she can. No thoughts of suicide.seeing psych  She has been  referred to a hand therapist for her hands, where she is using braces and exercises, which seem to be helping.  She mentions a past concern about her kidney function, but recent consultation with a nephrologist indicated no significant issues. She recalls a history of elevated lipase levels related to gallbladder issues, which have since been resolved.    There are no preventive care reminders to display for this patient.   Past Medical History:  Diagnosis Date   Allergy 1980's   Sulfa drugs and penecillin   Anxiety    Cataract 2021   Corrected   Chronic kidney disease 2024   Stage 3   Depression    Headache    SINUS   Hyperlipidemia    Hypertension    Osteopenia    Osteopenia    Primary localized osteoarthritis of right knee     Past Surgical History:  Procedure Laterality Date   BREAST EXCISIONAL BIOPSY Right    benign cyst removal    CARPAL TUNNEL RELEASE Right 2015   Ortho in [place]   CHOLECYSTECTOMY N/A 02/08/2022   Procedure: LAPAROSCOPIC CHOLECYSTECTOMY WITH INTRAOPERATIVE CHOLANGIOGRAM;  Surgeon: Romie Levee, MD;  Location: [facility] ORS;  Service: General;  Laterality: N/A;   EYE SURGERY  12/2019   cataracts   FRACTURE SURGERY  2016   Rt.  leg   JOINT REPLACEMENT  09/2015   Right knee   LASIK     MENISCUS REPAIR Right 2011   Orthopedic in [facility]   TOTAL KNEE ARTHROPLASTY Right 09/20/2015  TOTAL KNEE ARTHROPLASTY Right 09/20/2015   Procedure: TOTAL KNEE ARTHROPLASTY;  Surgeon: Salvatore Marvel, MD;  Location: [facility] OR;  Service: Orthopedics;  Laterality: Right;   Trigger Thumb Right 2015   Done in [facility] at the same time as the carpal tunnel release   TUBAL LIGATION  1980     Current Outpatient Medications:    cephALEXin (KEFLEX) 500 MG capsule, Take 1 capsule (500 mg total) by mouth 2 (two) times daily. Take for 7 days, Disp: 20 capsule, Rfl: 0   omeprazole (PRILOSEC) 40 MG capsule, Take 1 capsule (40 mg total) by mouth daily., Disp: 30 capsule, Rfl: 3    amLODipine-benazepril (LOTREL) 5-20 MG capsule, Take 1 capsule by mouth daily., Disp: 90 capsule, Rfl: 3   atorvastatin (LIPITOR) 20 MG tablet, Take 1 tablet (20 mg total) by mouth daily., Disp: 90 tablet, Rfl: 3   CALCIUM PO, Take 1,200 mg by mouth daily., Disp: , Rfl:    fluticasone (FLONASE) 50 MCG/ACT nasal spray, Place 1 spray into both nostrils daily as needed for allergies or rhinitis., Disp: , Rfl:    lidocaine (XYLOCAINE) 2 % solution, Use as directed 10 mLs in the mouth or throat every 6 (six) hours as needed for mouth pain., Disp: 100 mL, Rfl: 3   mirtazapine (REMERON) 7.5 MG tablet, Take 1-2 tablets (7.5-15 mg total) by mouth at bedtime as needed (sleep)., Disp: 60 tablet, Rfl: 2   Multiple Vitamin (MULTIVITAMIN ADULT PO), Take 1 tablet by mouth daily., Disp: , Rfl:    raloxifene (EVISTA) 60 MG tablet, Take 1 tablet (60 mg total) by mouth daily., Disp: 90 tablet, Rfl: 3   triamterene-hydrochlorothiazide (DYAZIDE) 37.5-25 MG capsule, Take 1 each (1 capsule total) by mouth daily., Disp: 90 capsule, Rfl: 3   venlafaxine XR (EFFEXOR-XR) 150 MG 24 hr capsule, Take 1 capsule (150 mg total) by mouth daily with breakfast., Disp: 90 capsule, Rfl: 1   zolpidem (AMBIEN) 5 MG tablet, Take 1.5 tablets (7.5 mg total) by mouth at bedtime as needed for sleep., Disp: 45 tablet, Rfl: 2  Allergies  Allergen Reactions   Penicillins Hives    Tolerates Cephalosporins    Prednisone Other (See Comments)    Hyperactivity, sleep disturbance, confusion   Sulfa Antibiotics Hives        ROS neg/noncontributory except as noted HPI/below      Objective:     BP 117/64   Pulse (!) 104   Temp 97.7 F (36.5 C)   Ht 5\' 2"  (1.575 m)   Wt 183 lb 3.2 oz (83.1 kg)   SpO2 95%   BMI 33.51 kg/m  Wt Readings from Last 3 Encounters:  05/28/23 183 lb 3.2 oz (83.1 kg)  05/16/23 181 lb 8 oz (82.3 kg)  03/21/23 190 lb (86.2 kg)    Physical Exam   Gen: [facility] NAD HEENT: NCAT, conjunctiva not injected, sclera  nonicteric Sinuses-+TTP all NECK:  supple, no thyromegaly, no nodes, no carotid bruits CARDIAC: RRR, S1S2+, no murmur. DP 2+B LUNGS: CTAB. No wheezes ABDOMEN:  BS+, soft, slightly tender diffusely, No HSM, no masses EXT:  no edema MSK: no gross abnormalities.  NEURO: A&O x3.  CN II-XII intact.  PSYCH: normal mood. Good eye contact     Assessment & Plan:  Essential hypertension -     amLODIPine Besy-Benazepril HCl; Take 1 capsule by mouth daily.  Dispense: 90 capsule; Refill: 3 -     Triamterene-HCTZ; Take 1 each (1 capsule total) by mouth daily.  Dispense: 90 capsule; Refill: 3 -     CBC with Differential/Platelet -     Comprehensive metabolic panel with GFR -     TSH  Pure hypercholesterolemia -     Atorvastatin Calcium; Take 1 tablet (20 mg total) by mouth daily.  Dispense: 90 tablet; Refill: 3 -     Comprehensive metabolic panel with GFR -     Lipid panel -     TSH  Osteopenia, unspecified location -     Raloxifene HCl; Take 1 tablet (60 mg total) by mouth daily.  Dispense: 90 tablet; Refill: 3  Prediabetes -     Comprehensive metabolic panel with GFR -     Hemoglobin A1c  Generalized anxiety disorder  Gastroesophageal reflux disease without esophagitis -     Omeprazole; Take 1 capsule (40 mg total) by mouth daily.  Dispense: 30 capsule; Refill: 3  Acute bacterial sinusitis  Other orders -     Cephalexin; Take 1 capsule (500 mg total) by mouth 2 (two) times daily. Take for 7 days  Dispense: 20 capsule; Refill: 0  Assessment and Plan Assessment & Plan Sinusitis   She has experienced sinus pressure and headache for over a week, accompanied by chills and achiness, without runny nose or congestion. These symptoms suggest a sinus infection. She prefers Keflex based on past effectiveness. Prescribe Keflex for sinusitis and send the prescription to [facility].  Gastroesophageal Reflux Disease (GERD)   Increased acid indigestion and diarrhea indicate GERD, with  no dark tarry stools or dysphagia. She is not on current medication. Start Prilosec (omeprazole) once daily for GERD, increasing to twice daily if no improvement after two weeks. Continue for 8 weeks to allow healing. Message if symptoms persist or worsen. Consider probiotics for diarrhea management. Consider referral to GI if worse, not improved  Hypertension   Her blood pressure is well-controlled with amlodipine-benazepril and Dyazide, with readings around 120/70 mmHg. She reports no headache, dizziness, chest pain, or dyspnea. Continue the current antihypertensive regimen.  Hyperlipidemia   She is on atorvastatin 20 mg for cholesterol management. Her last cholesterol check is due for reassessment. Order cholesterol blood work.  Chronic Kidney Disease (CKD)   A nephrologist evaluated less than 1% chance of renal failure in five years. Her creatinine levels are stable, and no further nephrology follow-up is necessary. Monitor kidney function as part of routine blood work.  Osteoporosis   She has been on Evista for over ten years. Her last bone density scan was in 2023, with the next due after June 2025. Order a bone density scan after June 2025.  PreDM-working on diet and some exercise to prevent progression to DM-check labs     Return in about 6 months (around 11/27/2023) for chronic follow-up.  Ellsworth Haas, MD

## 2023-05-28 NOTE — Patient Instructions (Signed)
 It was very nice to see you today!  Add prilosec daily.  If no change in 2 wks, let me know.  If some improvement, continue for 8 wks Can do probiotics.    PLEASE NOTE:  If you had any lab tests please let us  know if you have not heard back within a few days. You may see your results on MyChart before we have a chance to review them but we will give you a call once they are reviewed by us . If we ordered any referrals today, please let us  know if you have not heard from their office within the next week.   Please try these tips to maintain a healthy lifestyle:  Eat most of your calories during the day when you are active. Eliminate processed foods including packaged sweets (pies, cakes, cookies), reduce intake of potatoes, white bread, white pasta, and white rice. Look for whole grain options, oat flour or almond flour.  Each meal should contain half fruits/vegetables, one quarter protein, and one quarter carbs (no bigger than a computer mouse).  Cut down on sweet beverages. This includes juice, soda, and sweet tea. Also watch fruit intake, though this is a healthier sweet option, it still contains natural sugar! Limit to 3 servings daily.  Drink at least 1 glass of water with each meal and aim for at least 8 glasses per day  Exercise at least 150 minutes every week.

## 2023-05-29 ENCOUNTER — Encounter: Payer: Self-pay | Admitting: Family Medicine

## 2023-05-29 LAB — COMPREHENSIVE METABOLIC PANEL WITH GFR
ALT: 17 U/L (ref 0–35)
AST: 22 U/L (ref 0–37)
Albumin: 4.7 g/dL (ref 3.5–5.2)
Alkaline Phosphatase: 101 U/L (ref 39–117)
BUN: 21 mg/dL (ref 6–23)
CO2: 23 meq/L (ref 19–32)
Calcium: 9.8 mg/dL (ref 8.4–10.5)
Chloride: 104 meq/L (ref 96–112)
Creatinine, Ser: 1.31 mg/dL — ABNORMAL HIGH (ref 0.40–1.20)
GFR: 39.01 mL/min — ABNORMAL LOW (ref >60.00)
Glucose, Bld: 118 mg/dL — ABNORMAL HIGH (ref 70–99)
Potassium: 3.9 meq/L (ref 3.5–5.1)
Sodium: 140 meq/L (ref 135–145)
Total Bilirubin: 0.4 mg/dL (ref 0.2–1.2)
Total Protein: 8 g/dL (ref 6.0–8.3)

## 2023-05-29 LAB — LIPID PANEL
Cholesterol: 155 mg/dL (ref 0–200)
HDL: 71.5 mg/dL (ref >39.00)
LDL Cholesterol: 62 mg/dL (ref 0–99)
NonHDL: 83.73
Total CHOL/HDL Ratio: 2
Triglycerides: 110 mg/dL (ref 0.0–149.0)
VLDL: 22 mg/dL (ref 0.0–40.0)

## 2023-05-29 LAB — CBC WITH DIFFERENTIAL/PLATELET
Basophils Absolute: 0.1 10*3/uL (ref 0.0–0.1)
Basophils Relative: 1.2 % (ref 0.0–3.0)
Eosinophils Absolute: 0.1 10*3/uL (ref 0.0–0.7)
Eosinophils Relative: 0.8 % (ref 0.0–5.0)
HCT: 38.3 % (ref 36.0–46.0)
Hemoglobin: 12.4 g/dL (ref 12.0–15.0)
Lymphocytes Relative: 19.4 % (ref 12.0–46.0)
Lymphs Abs: 2.1 10*3/uL (ref 0.7–4.0)
MCHC: 32.5 g/dL (ref 30.0–36.0)
MCV: 85.9 fl (ref 78.0–100.0)
Monocytes Absolute: 0.7 10*3/uL (ref 0.1–1.0)
Monocytes Relative: 6.5 % (ref 3.0–12.0)
Neutro Abs: 7.7 10*3/uL (ref 1.4–7.7)
Neutrophils Relative %: 72.1 % (ref 43.0–77.0)
Platelets: 477 10*3/uL — ABNORMAL HIGH (ref 150.0–400.0)
RBC: 4.45 Mil/uL (ref 3.87–5.11)
RDW: 14.8 % (ref 11.5–15.5)
WBC: 10.6 10*3/uL — ABNORMAL HIGH (ref 4.0–10.5)

## 2023-05-29 LAB — HEMOGLOBIN A1C: Hgb A1c MFr Bld: 6.3 % (ref 4.6–6.5)

## 2023-05-29 LAB — TSH: TSH: 1.72 u[IU]/mL (ref 0.35–5.50)

## 2023-05-29 NOTE — Progress Notes (Signed)
 Labs stable.  Same meds.  A1C up some-keep working on diet/exercise

## 2023-05-31 ENCOUNTER — Other Ambulatory Visit (HOSPITAL_COMMUNITY): Payer: Self-pay

## 2023-05-31 ENCOUNTER — Other Ambulatory Visit (HOSPITAL_COMMUNITY): Payer: Self-pay | Admitting: Psychiatry

## 2023-05-31 DIAGNOSIS — F5101 Primary insomnia: Secondary | ICD-10-CM

## 2023-05-31 MED ORDER — ZOLPIDEM TARTRATE 5 MG PO TABS
7.5000 mg | ORAL_TABLET | Freq: Every evening | ORAL | 2 refills | Status: DC | PRN
  Filled 2023-05-31: qty 45, 45d supply, fill #0

## 2023-05-31 MED ORDER — MIRTAZAPINE 7.5 MG PO TABS
7.5000 mg | ORAL_TABLET | Freq: Every evening | ORAL | 2 refills | Status: DC | PRN
Start: 2023-05-31 — End: 2023-06-13
  Filled 2023-05-31: qty 60, 60d supply, fill #0

## 2023-06-11 ENCOUNTER — Encounter: Payer: Self-pay | Admitting: Family Medicine

## 2023-06-12 NOTE — Progress Notes (Unsigned)
 BH MD Outpatient Progress Note  06/13/2023 11:51 AM [name]  MRN:  [id number]  Assessment:  [name] presents for follow-up evaluation. Today, 06/13/23, patient reports ongoing issues with sleep identifying significant anxiety around bedtime leading to self-fulfilling outcome of inability to sleep. She is practicing fairly good sleep hygiene although was counseled on cutting out any caffeine past 2PM, avoiding daytime naps, and incorporating daily exercise. She does feel that she tends to sleep better on days she takes walks and she was provided with local resources for community exercise programs. She denies any benefit from trial of Rozerem . Given role that uncontrolled anxiety is having on sleep, patient is amenable to restarting Buspar  given past benefit for anxiety and mood. She reports overall stability of mood although mood is certainly impacted on days following a poor night's sleep.   RTC in 2 months by video.  Identifying Information: [name] is a 78 y.o. female with a history of MDD, anxiety, arthritis, HTN, CKD3B, prediabetes who is an established patient with [facility] participating in follow-up via video conferencing. Primary focus of treatment has been on chronic insomnia which has been fairly resistant to numerous medication trials. She has participated in CBT-i. While recommendation against long-term use of Ambien  has been discussed especially in older population, efforts have been made to improve safety of overall regimen including discontinuation of Klonopin . Will continue to work towards decrease and ultimately discontinuation of Ambien  as other strategies for management of insomnia are explored.   Plan:  # GAD  MDD in remission Past medication trials: Remeron  (appetite increase and weight gain); Klonopin  Status of problem: moderate exacerbation of anxiety / mood symptoms stable Interventions: -- Continue Effexor  150  mg daily  -- Medical considerations: patient noted to have CKD3B; Cr clearance > 30 based on labs 05/28/23; no dosage adjustment indicated at this time -- START Buspar  5 mg BID  -- Will start at low daily dose given renal impairment and titrate based on response and tolerability  # Insomnia, chronic Past medication trials: Restoril , Klonopin , Remeron  (appetite increase and weight gain), doxepin  (ineffective), trazodone  (nausea, diarrhea, dizziness), Atarax  ("jumpy"), gabapentin  ("dopey"), doxylamine  (ineffective); Rozerem  (ineffective) Status of problem: chronic Interventions: -- Continue Ambien  7.5 mg nightly              -- Risks, benefits, and side effects including but not limited to drowsiness and daytime sedation, dizziness, decreased mental alertness, falls and related injuries, increased risk for dementia was discussed              -- Have discussed plan for gradual taper and discontinuation of this medication over time given lack of indication for long term use -- Patient previously seen by CBT-i therapist Verna Gola LCSW at [facility] -- Sleep hygiene extensively reviewed today: encouraged cessation of any caffeine after 2PM; incorporation of daily exercise  -- Provided with local resources for [facility] and Silver Sneakers program -- Sleep study 05/10/20: mildest OSA; CPAP not recommended; could consider dental device -- Manage anxiety and mood symptoms as above    Patient was given contact information for behavioral health clinic and was instructed to call 911 for emergencies.   Subjective:  Chief Complaint:  Chief Complaint  Patient presents with   Medication Management    Interval History:  [name] reports things are "as usual - feeling up and down." Reports mood largely depends on sleep.   Tried ramelteon  but reports it had no effect; took for  1 week with no benefit for sleep onset or maintenance. Continues to find only thing that is helpful is Ambien  - taking most  nights.   On review of sleep the past week, reports if she falls asleep quickly she is fine but if she is awake for more than 30 min, she will feel anxious and agitated about inability to sleep; then focuses on inability to sleep. Will try to get her mind onto other things, practice PMR. If she can't fall asleep, will get up and read and then return to bed.   Last night, slept well with about 6.5 hours. Night prior got about 3-4 hours. On nights she can't sleep, may nap during the day - about an hour. Reviewed recommendation to avoid napping during the day.   Did find therapy helpful for anxiety although sleep issues persisted.   She stopped taking Remeron  about 2 weeks ago as she felt it was no longer helpful; was worried about appetite and weight gain and this has improved since stopping. Confirms she is taking Effexor  in the morning.  Caffeine: denies use of coffee, tea, energy drinks, herbals/supplements. 2 Diet Cokes a day; latest will be with dinner time. Discussed recommendation to cut out all caffeine prior to 2PM.   Reports mood is good when she sleeps well. When she had a bad night and is feeling really down, may experience thoughts of "I'm not enjoying my life" but denies passive/active SI.   Reports limited physical activity; may go on walks a few times per week. Thinks she may sleep better on days she goes on walks. Reviewed importance of exercise for physical and mental health as well as sleep - she is open to trying this. Provided with local resources for [employer] program.   Given uptick in anxiety interfering with sleep, she is amenable to restarting Buspar  at this time. She reports finding this medication helpful in the past for energy and anxiety.   PDMP: -- Zolpidem  5 mg QTY 45 last filled 05/31/23 (regular rx dating back to Jan 2023)  Visit Diagnosis:    ICD-10-CM   1. Generalized anxiety disorder  F41.1     2. Primary insomnia  F51.01     3. MDD  (recurrent major depressive disorder) in remission (HCC)  F33.40       Past Psychiatric History:  Diagnoses: MDD with anxiety Medication trials: Restoril , Remeron  (appetite increase and weight gain), doxepin  (ineffective), trazodone  (nausea, diarrhea, dizziness), melatonin, Atarax  ("jumpy"), gabapentin  ("dopey"), doxylamine  (ineffective), Rozerem  (ineffective) Suicide attempts: denies Substance use: denies etoh, tobacco, or illicit drug use Caffeine: 2 diet cokes per day (one of which is with dinner)  Past Medical History:  Past Medical History:  Diagnosis Date   Allergy 1980's   Sulfa drugs and penecillin   Anxiety    Cataract 2021   Corrected   Chronic kidney disease 2024   Stage 3   Depression    Headache    SINUS   Hyperlipidemia    Hypertension    Osteopenia    Osteopenia    Primary localized osteoarthritis of right knee     Past Surgical History:  Procedure Laterality Date   BREAST EXCISIONAL BIOPSY Right    benign cyst removal    CARPAL TUNNEL RELEASE Right 2015   Ortho in [place]   CHOLECYSTECTOMY N/A 02/08/2022   Procedure: LAPAROSCOPIC CHOLECYSTECTOMY WITH INTRAOPERATIVE CHOLANGIOGRAM;  Surgeon: Joyce Nixon, MD;  Location: [facility];  Service: General;  Laterality: N/A;  EYE SURGERY  12/2019   cataracts   FRACTURE SURGERY  2016   Rt.  leg   JOINT REPLACEMENT  09/2015   Right knee   LASIK     MENISCUS REPAIR Right 2011   [facility]thopedic in [facility]   TOTAL KNEE ARTHROPLASTY Right 09/20/2015   TOTAL KNEE ARTHROPLASTY Right 09/20/2015   Procedure: TOTAL KNEE ARTHROPLASTY;  Surgeon: Elly Habermann, MD;  Location: [facility] OR;  Service: Orthopedics;  Laterality: Right;   Trigger Thumb Right 2015   Done in [facility] at the same time as the carpal tunnel release   TUBAL LIGATION  1980    Family Psychiatric History:  Mothe: anxiety Maternal aunt: alcohol use, anorexia   Family History:  Family History  Problem Relation Age of Onset   Heart disease Mother     Arthritis Mother    Heart failure Mother    Heart attack Mother    Anxiety disorder Mother    Depression Mother    Hearing loss Mother    Varicose Veins Mother    Early death Father    Heart attack Father    Hearing loss Father    Heart disease Father    Hyperlipidemia Sister    Heart disease Sister    Hearing loss Sister    Arthritis Sister    Irregular heart beat Sister    Hypertension Sister    Hypertension Brother    Hyperlipidemia Brother    Heart attack Brother    Asthma Brother    Alcohol abuse Brother    Heart disease Brother    Early death Brother    Heart attack Brother    Depression Son    Alcohol abuse Maternal Aunt     Social History:  Social History   Socioeconomic History   Marital status: Married    Spouse name: Lawrence   Number of children: 1   Years of education: 17.5   Highest education level: Master's degree (e.g., MA, MS, MEng, MEd, MSW, MBA)  Occupational History   Occupation: retired    Comment: Comptroller  Tobacco Use   Smoking status: Never   Smokeless tobacco: Never  Vaping Use   Vaping status: Never Used  Substance and Sexual Activity   Alcohol use: Not Currently    Comment: previously 1 glass a month   Drug use: Never   Sexual activity: Yes    Birth control/protection: Post-menopausal, None  Other Topics Concern   Not on file  Social History Narrative   Reads paper, walks, housework errands. Caffeine use daily.   3 grands   Social Drivers of Corporate investment banker Strain: Low Risk  (05/12/2023)   Overall Financial Resource Strain (CARDIA)    Difficulty of Paying Living Expenses: Not hard at all  Food Insecurity: No Food Insecurity (05/12/2023)   Hunger Vital Sign    Worried About Running Out of Food in the Last Year: Never true    Ran Out of Food in the Last Year: Never true  Transportation Needs: No Transportation Needs (05/12/2023)   PRAPARE - Administrator, Civil Service (Medical): No    Lack of  Transportation (Non-Medical): No  Physical Activity: Insufficiently Active (05/12/2023)   Exercise Vital Sign    Days of Exercise per Week: 3 days    Minutes of Exercise per Session: 20 min  Stress: Stress Concern Present (05/12/2023)   [employer] of Occupational Health - Occupational Stress Questionnaire    Feeling of Stress : To  some extent  Social Connections: Socially Integrated (05/12/2023)   Social Connection and Isolation Panel [NHANES]    Frequency of Communication with Friends and Family: Three times a week    Frequency of Social Gatherings with Friends and Family: Twice a week    Attends Religious Services: 1 to 4 times per year    Active Member of [employer] or Organizations: Yes    Attends Banker Meetings: 1 to 4 times per year    Marital Status: Married    Allergies:  Allergies  Allergen Reactions   Penicillins Hives    Tolerates Cephalosporins    Prednisone  Other (See Comments)    Hyperactivity, sleep disturbance, confusion   Sulfa Antibiotics Hives         Current Medications: Current Outpatient Medications  Medication Sig Dispense Refill   venlafaxine  XR (EFFEXOR -XR) 150 MG 24 hr capsule Take 1 capsule (150 mg total) by mouth daily with breakfast. 90 capsule 1   zolpidem  (AMBIEN ) 5 MG tablet Take 1.5 tablets (7.5 mg total) by mouth at bedtime as needed for sleep. 45 tablet 2   amLODipine -benazepril  (LOTREL ) 5-20 MG capsule Take 1 capsule by mouth daily. 90 capsule 3   atorvastatin  (LIPITOR) 20 MG tablet Take 1 tablet (20 mg total) by mouth daily. 90 tablet 3   CALCIUM  PO Take 1,200 mg by mouth daily.     cephALEXin  (KEFLEX ) 500 MG capsule Take 1 capsule (500 mg total) by mouth 2 (two) times daily. Take for 7 days 20 capsule 0   fluticasone  (FLONASE ) 50 MCG/ACT nasal spray Place 1 spray into both nostrils daily as needed for allergies or rhinitis.     lidocaine  (XYLOCAINE ) 2 % solution Use as directed 10 mLs in the mouth or throat every 6 (six)  hours as needed for mouth pain. 100 mL 3   Multiple Vitamin (MULTIVITAMIN ADULT PO) Take 1 tablet by mouth daily.     omeprazole  (PRILOSEC) 40 MG capsule Take 1 capsule (40 mg total) by mouth daily. 30 capsule 3   raloxifene  (EVISTA ) 60 MG tablet Take 1 tablet (60 mg total) by mouth daily. 90 tablet 3   triamterene -hydrochlorothiazide  (DYAZIDE ) 37.5-25 MG capsule Take 1 each (1 capsule total) by mouth daily. 90 capsule 3   No current facility-administered medications for this visit.    ROS: Reports intentional weight loss  Objective:  Psychiatric Specialty Exam: There were no vitals taken for this visit.There is no height or weight on file to calculate BMI.  General Appearance: Casual, Neat, and Well Groomed  Eye Contact:  Good  Speech:  Clear and Coherent and Normal Rate  Volume:  Normal  Mood:   "up and down"  Affect:   Euthymic; mildly anxious  Thought Content:  Denies AVH; IOR; paranoia    Suicidal Thoughts:  No  Homicidal Thoughts:  No  Thought Process:  Goal Directed and Linear  Orientation:  Full (Time, Place, and Person)    Memory:   Grossly intact  Judgment:  Good  Insight:  Good  Concentration:  Concentration: Good  Recall:  NA  Fund of Knowledge: Good  Language: Good  Psychomotor Activity:  Normal  Akathisia:  No  AIMS (if indicated): not done  Assets:  Communication Skills Desire for Improvement Financial Resources/Insurance Housing Intimacy Leisure Time Physical Health Resilience Social Support Talents/Skills Transportation  ADL's:  Intact  Cognition: WNL  Sleep:   chronic insomnia   PE: General: sits comfortably in view of camera; no acute distress  Pulm: no increased work of breathing on room air  MSK: all extremity movements appear intact  Neuro: no focal neurological deficits observed  Gait & Station: unable to assess by video   Metabolic Disorder Labs: Lab Results  Component Value Date   HGBA1C 6.3 05/28/2023   MPG 131 01/17/2019    MPG 128 11/28/2017   No results found for: "PROLACTIN" Lab Results  Component Value Date   CHOL 155 05/28/2023   TRIG 110.0 05/28/2023   HDL 71.50 05/28/2023   CHOLHDL 2 05/28/2023   VLDL 22.0 05/28/2023   LDLCALC 62 05/28/2023   LDLCALC 77 04/17/2022   Lab Results  Component Value Date   TSH 1.72 05/28/2023   TSH 3.07 04/17/2022    Therapeutic Level Labs: No results found for: "LITHIUM" No results found for: "VALPROATE" No results found for: "CBMZ"  Screenings:  GAD-7    Flowsheet Row Office Visit from [facility]/28/2023 in [facility] [employer] from 11/15/2022 in [facility] [employer] from 09/27/2022 in [facility] [employer] [facility] from 06/09/2022 in [facility] [employer] Visit from 04/17/2022 in [facility]  Total GAD-7 Score 0 7 8 16 18       PHQ2-9    Flowsheet Row Office Visit from 05/28/2023 in [facility] [place] [employer] Visit from 05/16/2023 in [facility] [place] [employer] Visit from 11/15/2022 in [facility] [employer] Visit from 09/27/2022 in [facility] [place] Clinical Support from 08/08/2022 in [facility] [place]  PHQ-2 Total Score 0 2 2 2 2   PHQ-9 Total Score 0 9 5 10 6       Flowsheet Row ED from 12/25/2022 in [facility] Emergency Department at [facility] ED from 12/04/2022 in [facility] ED to Hosp-Admission (Discharged) from 02/05/2022 in [facility] EAST MEDICAL UNIT  C-SSRS RISK CATEGORY No Risk No Risk No Risk       Collaboration of Care: Collaboration of Care: Medication Management AEB ongoing medication management and Psychiatrist AEB established with this  provider  Patient/Guardian was advised Release of Information must be obtained prior to any record release in order to collaborate their care with an outside provider. Patient/Guardian was advised if they have not already done so to contact the registration department to sign all necessary forms in order for us  to release information regarding their care.   Consent: Patient/Guardian gives verbal consent for treatment and assignment of benefits for services provided during this visit. Patient/Guardian expressed understanding and agreed to proceed.   Virtual Visit via Video Note  I connected with [name] on 06/13/23 at 11:00 AM EDT by a video enabled telemedicine application and verified that I am speaking with the correct person using two identifiers.  Location: Patient: home address in [place] Provider: remote office in [place]   I discussed the limitations of evaluation and management by telemedicine and the availability of in person appointments. The patient expressed understanding and agreed to proceed.   I discussed the assessment and treatment plan with the patient. The patient was provided an opportunity to ask questions and all were answered. The patient agreed with the plan and demonstrated an understanding of the instructions.  The patient was advised to call back or seek an in-person evaluation if the symptoms worsen or if the condition fails to improve as anticipated.  I provided 35 minutes dedicated to the care of this patient via video on the date of this encounter to include chart review, face-to-face time with the patient, medication management/counseling, documentation, extensive review of sleep hygiene, review of local community programs.  Alyzabeth Pontillo A Genella Bas 06/13/2023, 11:51 AM

## 2023-06-13 ENCOUNTER — Telehealth (HOSPITAL_COMMUNITY): Admitting: Psychiatry

## 2023-06-13 ENCOUNTER — Other Ambulatory Visit (HOSPITAL_COMMUNITY): Payer: Self-pay

## 2023-06-13 ENCOUNTER — Encounter (HOSPITAL_COMMUNITY): Payer: Self-pay | Admitting: Psychiatry

## 2023-06-13 DIAGNOSIS — F411 Generalized anxiety disorder: Secondary | ICD-10-CM | POA: Diagnosis not present

## 2023-06-13 DIAGNOSIS — F5101 Primary insomnia: Secondary | ICD-10-CM | POA: Diagnosis not present

## 2023-06-13 DIAGNOSIS — F3340 Major depressive disorder, recurrent, in remission, unspecified: Secondary | ICD-10-CM

## 2023-06-13 MED ORDER — ZOLPIDEM TARTRATE 5 MG PO TABS
7.5000 mg | ORAL_TABLET | Freq: Every evening | ORAL | 2 refills | Status: DC | PRN
  Filled 2023-06-13 – 2023-06-28 (×2): qty 45, 30d supply, fill #0
  Filled 2023-06-30: qty 45, 90d supply, fill #0
  Filled 2023-07-02: qty 45, 30d supply, fill #0
  Filled 2023-08-01: qty 45, 30d supply, fill #1
  Filled 2023-08-31: qty 45, 30d supply, fill #2

## 2023-06-13 MED ORDER — VENLAFAXINE HCL ER 150 MG PO CP24
150.0000 mg | ORAL_CAPSULE | Freq: Every day | ORAL | 1 refills | Status: DC
  Filled 2023-06-13: qty 90, 90d supply, fill #0
  Filled 2023-09-30: qty 90, 90d supply, fill #1

## 2023-06-13 MED ORDER — BUSPIRONE HCL 5 MG PO TABS
5.0000 mg | ORAL_TABLET | Freq: Two times a day (BID) | ORAL | 2 refills | Status: AC
Start: 2023-06-13 — End: 2023-09-11
  Filled 2023-06-13: qty 60, 30d supply, fill #0
  Filled 2023-07-06: qty 60, 30d supply, fill #1
  Filled 2023-08-06: qty 60, 30d supply, fill #2

## 2023-06-13 NOTE — Patient Instructions (Signed)
 Thank you for attending your appointment today.  -- START Buspar  5 mg twice daily -- Continue other medications as prescribed. -- Look into local exercise and community programs as physical activity during the day can have a significant impact on sleep:  -- [employer]: CriticCrunch.co.nz?type=ym[employer],camps,facilities&amenities  -- Silver Sneakers program: Reach out to Medicare to see if you are eligible for SilverSneakers. https://tools.silversneakers.com/  Please do not make any changes to medications without first discussing with your provider. If you are experiencing a psychiatric emergency, please call 911 or present to your nearest emergency department. Additional crisis, medication management, and therapy resources are included below.  [facility]  [address] [place], [place] [address] [phone] WALK-IN URGENT CARE 24/7 FOR ANYONE [address] [place], [place]  [phone] Fax: [phone] [email or web address] *Interpreters available *Accepts all insurance and uninsured for Urgent Care needs *Accepts Medicaid and uninsured for outpatient treatment (below)      ONLY FOR [facility]  Below:    Outpatient New Patient Assessment/Therapy Walk-ins:        Monday, Wednesday, and Thursday 8am until slots are full (first come, first served)                   New Patient Psychiatry/Medication Management        Monday-Friday 8am-11am (first come, first served)               For all walk-ins we ask that you arrive by 7:15am, because patients will be seen in the order of arrival.

## 2023-06-15 ENCOUNTER — Other Ambulatory Visit (HOSPITAL_COMMUNITY): Payer: Self-pay

## 2023-06-28 ENCOUNTER — Other Ambulatory Visit: Payer: Self-pay

## 2023-06-28 ENCOUNTER — Other Ambulatory Visit (HOSPITAL_COMMUNITY): Payer: Self-pay

## 2023-06-30 ENCOUNTER — Other Ambulatory Visit (HOSPITAL_COMMUNITY): Payer: Self-pay

## 2023-06-30 ENCOUNTER — Other Ambulatory Visit: Payer: Self-pay

## 2023-07-02 ENCOUNTER — Ambulatory Visit (INDEPENDENT_AMBULATORY_CARE_PROVIDER_SITE_OTHER): Admitting: Family Medicine

## 2023-07-02 ENCOUNTER — Encounter: Payer: Self-pay | Admitting: Family Medicine

## 2023-07-02 ENCOUNTER — Ambulatory Visit

## 2023-07-02 ENCOUNTER — Other Ambulatory Visit (HOSPITAL_COMMUNITY): Payer: Self-pay

## 2023-07-02 VITALS — BP 134/73 | HR 94 | Temp 97.2°F | Resp 18 | Ht 62.0 in | Wt 181.1 lb

## 2023-07-02 DIAGNOSIS — J019 Acute sinusitis, unspecified: Secondary | ICD-10-CM | POA: Diagnosis not present

## 2023-07-02 DIAGNOSIS — B9689 Other specified bacterial agents as the cause of diseases classified elsewhere: Secondary | ICD-10-CM | POA: Diagnosis not present

## 2023-07-02 MED ORDER — CEFDINIR 300 MG PO CAPS
300.0000 mg | ORAL_CAPSULE | Freq: Two times a day (BID) | ORAL | 0 refills | Status: DC
  Filled 2023-07-02: qty 14, 7d supply, fill #0

## 2023-07-02 NOTE — Patient Instructions (Signed)
 Sending antibiotics  Let me know Thursday if not improving

## 2023-07-02 NOTE — Progress Notes (Signed)
 Subjective:     Patient ID: [name], female    DOB: November 11, 1945, 78 y.o.   MRN: [id number]  Chief Complaint  Patient presents with   Sinus Problem    Facial pain, nasal drainage, chills, sx started over 1 week ago Has been taking OTC sinus medication with no relief    HPI Discussed the use of AI scribe software for clinical note transcription with the patient, who gave verbal consent to proceed.  History of Present Illness [name] is a 78 year old female who presents with symptoms of a sinus infection.  She has been experiencing symptoms consistent with a sinus infection for approximately ten days, beginning around May 9th or 10th. Symptoms include facial pain and pressure, generalized aching, and nasal drainage. Her nose is currently clear, but she experiences intermittent ear fullness. She reports chills and shaking yesterday without fever, and occasional warmth and sweating, but predominantly feels chilled.  She has a history of recurrent sinus infections, with a recent episode in April treated with Keflex . She is allergic to penicillin and sulfa medications, which limits her antibiotic options.  She uses a generic form of Flonase  and saline solution for symptom relief. She has tried cold capsules and over-the-counter medications but finds them ineffective.  During the review of symptoms, she notes throat tenderness, especially in the morning, and diarrhea after eating. No fever, but she reports chills and sweating. Her ears feel full but are not painful.    Health Maintenance Due  Topic Date Due   Medicare Annual Wellness (AWV)  08/08/2023    Past Medical History:  Diagnosis Date   Allergy 1980's   Sulfa drugs and penecillin   Anxiety    Cataract 2021   Corrected   Chronic kidney disease 2024   Stage 3   Depression    Headache    SINUS   Hyperlipidemia    Hypertension    Osteopenia    Osteopenia    Primary localized osteoarthritis of  right knee     Past Surgical History:  Procedure Laterality Date   BREAST EXCISIONAL BIOPSY Right    benign cyst removal    CARPAL TUNNEL RELEASE Right 2015   Ortho in [place]   CHOLECYSTECTOMY N/A 02/08/2022   Procedure: LAPAROSCOPIC CHOLECYSTECTOMY WITH INTRAOPERATIVE CHOLANGIOGRAM;  Surgeon: Joyce Nixon, MD;  Location: [facility];  Service: General;  Laterality: N/A;   EYE SURGERY  12/2019   cataracts   FRACTURE SURGERY  2016   Rt.  leg   JOINT REPLACEMENT  09/2015   Right knee   LASIK     MENISCUS REPAIR Right 2011   Orthopedic in [facility]   TOTAL KNEE ARTHROPLASTY Right 09/20/2015   TOTAL KNEE ARTHROPLASTY Right 09/20/2015   Procedure: TOTAL KNEE ARTHROPLASTY;  Surgeon: Elly Habermann, MD;  Location: [facility];  Service: Orthopedics;  Laterality: Right;   Trigger Thumb Right 2015   Done in [facility] at the same time as the carpal tunnel release   TUBAL LIGATION  1980     Current Outpatient Medications:    amLODipine -benazepril  (LOTREL ) 5-20 MG capsule, Take 1 capsule by mouth daily., Disp: 90 capsule, Rfl: 3   atorvastatin  (LIPITOR) 20 MG tablet, Take 1 tablet (20 mg total) by mouth daily., Disp: 90 tablet, Rfl: 3   busPIRone  (BUSPAR ) 5 MG tablet, Take 1 tablet (5 mg total) by mouth 2 (two) times daily., Disp: 60 tablet, Rfl: 2   CALCIUM  PO, Take 1,200 mg by mouth daily.,  Disp: , Rfl:    cefdinir  (OMNICEF ) 300 MG capsule, Take 1 capsule (300 mg total) by mouth 2 (two) times daily., Disp: 14 capsule, Rfl: 0   fluticasone  (FLONASE ) 50 MCG/ACT nasal spray, Place 1 spray into both nostrils daily as needed for allergies or rhinitis., Disp: , Rfl:    lidocaine  (XYLOCAINE ) 2 % solution, Use as directed 10 mLs in the mouth or throat every 6 (six) hours as needed for mouth pain., Disp: 100 mL, Rfl: 3   Multiple Vitamin (MULTIVITAMIN ADULT PO), Take 1 tablet by mouth daily., Disp: , Rfl:    omeprazole  (PRILOSEC) 40 MG capsule, Take 1 capsule (40 mg total) by mouth daily., Disp: 30 capsule,  Rfl: 3   raloxifene  (EVISTA ) 60 MG tablet, Take 1 tablet (60 mg total) by mouth daily., Disp: 90 tablet, Rfl: 3   triamterene -hydrochlorothiazide  (DYAZIDE ) 37.5-25 MG capsule, Take 1 capsule by mouth daily., Disp: 90 capsule, Rfl: 3   venlafaxine  XR (EFFEXOR -XR) 150 MG 24 hr capsule, Take 1 capsule (150 mg total) by mouth daily with breakfast., Disp: 90 capsule, Rfl: 1   zolpidem  (AMBIEN ) 5 MG tablet, Take 1.5 tablets (7.5 mg total) by mouth at bedtime as needed for sleep., Disp: 45 tablet, Rfl: 2  Allergies  Allergen Reactions   Penicillins Hives    Tolerates Cephalosporins    Prednisone  Other (See Comments)    Hyperactivity, sleep disturbance, confusion   Sulfa Antibiotics Hives        ROS neg/noncontributory except as noted HPI/below      Objective:      BP 134/73   Pulse 94   Temp (!) 97.2 F (36.2 C) (Temporal)   Resp 18   Ht 5\' 2"  (1.575 m)   Wt 181 lb 2 oz (82.2 kg)   SpO2 97%   BMI 33.13 kg/m  Wt Readings from Last 3 Encounters:  07/02/23 181 lb 2 oz (82.2 kg)  05/28/23 183 lb 3.2 oz (83.1 kg)  05/16/23 181 lb 8 oz (82.3 kg)    Physical Exam   Gen: WDWN NAD HEENT: NCAT, conjunctiva not injected, sclera nonicteric TM WNL B, OP moist, no exudates.  Max sinuses tender B NECK:  supple, no thyromegaly, no nodes,  CARDIAC: RRR, S1S2+, no murmur. LUNGS: CTAB. No wheezes MSK: no gross abnormalities.  NEURO: A&O x3.  CN II-XII intact.  PSYCH: normal mood. Good eye contact     Assessment & Plan:  Acute bacterial sinusitis  Other orders -     Cefdinir ; Take 1 capsule (300 mg total) by mouth 2 (two) times daily.  Dispense: 14 capsule; Refill: 0  Assessment and Plan Assessment & Plan Acute sinusitis   Symptoms of acute sinusitis have persisted for 10 days, including facial pain, pressure, drainage, and chills without fever. Previous treatment with Keflex  in April was effective, but recurrence suggests the need for an alternative antibiotic. Recurrent  sinusitis may be exacerbated by seasonal allergies. Prescribe Omnicef  for its efficacy in respiratory infections. Monitor symptoms and report if no improvement by Wednesday or if symptoms worsen. Contact the office by Thursday morning if no improvement is noted to avoid complications over the holiday weekend.  Allergy to penicillin and sulfa drugs   Allergies to penicillin and sulfa drugs limit antibiotic options for infection treatment.    Return if symptoms worsen or fail to improve.  Ellsworth Haas, MD

## 2023-07-18 ENCOUNTER — Ambulatory Visit (INDEPENDENT_AMBULATORY_CARE_PROVIDER_SITE_OTHER): Admitting: Family Medicine

## 2023-07-18 ENCOUNTER — Encounter: Payer: Self-pay | Admitting: Family Medicine

## 2023-07-18 ENCOUNTER — Other Ambulatory Visit: Payer: Self-pay

## 2023-07-18 VITALS — BP 118/72 | HR 76 | Ht 62.0 in | Wt 186.0 lb

## 2023-07-18 DIAGNOSIS — M79642 Pain in left hand: Secondary | ICD-10-CM | POA: Diagnosis not present

## 2023-07-18 DIAGNOSIS — M79641 Pain in right hand: Secondary | ICD-10-CM

## 2023-07-18 DIAGNOSIS — M25551 Pain in right hip: Secondary | ICD-10-CM | POA: Diagnosis not present

## 2023-07-18 NOTE — Patient Instructions (Addendum)
 Thank you for coming in today.   I've referred you to Physical Therapy here, at this office.  You can schedule your 1st visit at the check-out desk before you leave today.  Check back in 6 weeks

## 2023-07-18 NOTE — Progress Notes (Signed)
   I, Miquel Amen, CMA acting as a scribe for Garlan Juniper, MD.  [name] is a 78 y.o. female who presents to [facility] at [facility] today for exacerbation of her bilat hand pain. Pt was last seen by Dr. Alease Hunter on 03/21/23 and was advised to use heat and Voltaren  gel. Also referred to OT, completing 3 visits.  Today, pt reports improvement of right hand pain, continued left hand pain. Therapy was helpful, has been compliant with HEP. Has been wearing braces bilaterally. No meds for pain currently. Using heat mostly, sometimes ice. No relief with Voltaren  Gel.   Also c/o right leg pain x 1-2 weeks. Sx started after a trip to [place]. Notes weakness with ascending stairs. Also having pain in the lateral right hip and thigh. Difficulty with first steps after standing. Feels like sx are affecting gait. Some lower back pain. Denies n/t in the leg or foot.   Dx testing: 07/26/21 DEXA scan  Pertinent review of systems: No fevers or chills  Relevant historical information: Hypertension.  Pia.  Bone densities will be set in the near future.   Exam:  BP 118/72   Pulse 76   Ht 5\' 2"  (1.575 m)   Wt 186 lb (84.4 kg)   SpO2 98%   BMI 34.02 kg/m  General: Well Developed, well nourished, and in no acute distress.   MSK: Right hip normal-appearing Tender palpation lateral epicondyle. Intact hip motion. Decreased strength abduction.  Hands bilaterally significant degeneration.  Not particularly tender to palpation.   CT scan images of the right hip visible on CT scan abdomen and pelvis December 2023 personally and independently interpreted today. No significant hip arthritis visible  Assessment and Plan: 78 y.o. female with right lateral hip pain due to hip abductor tendinopathy and weakness.  She is a great candidate for physical therapy.  Plan to refer to PT for this issue.  Recheck in 6 weeks.  Her hands are doing well enough now that she is going to  continue home exercise program.  Happy to do an injection if they are painful enough to consider one.   PDMP not reviewed this encounter. Orders Placed This Encounter  Procedures   Ambulatory referral to Physical Therapy    Referral Priority:   Routine    Referral Type:   Physical Medicine    Referral Reason:   Specialty Services Required    Requested Specialty:   Physical Therapy    Number of Visits Requested:   1   No orders of the defined types were placed in this encounter.    Discussed warning signs or symptoms. Please see discharge instructions. Patient expresses understanding.   The above documentation has been reviewed and is accurate and complete Garlan Juniper, M.D.

## 2023-07-25 ENCOUNTER — Other Ambulatory Visit (HOSPITAL_COMMUNITY): Payer: Self-pay

## 2023-07-25 ENCOUNTER — Telehealth: Admitting: Physician Assistant

## 2023-07-25 DIAGNOSIS — J208 Acute bronchitis due to other specified organisms: Secondary | ICD-10-CM | POA: Diagnosis not present

## 2023-07-25 DIAGNOSIS — B9689 Other specified bacterial agents as the cause of diseases classified elsewhere: Secondary | ICD-10-CM | POA: Diagnosis not present

## 2023-07-25 MED ORDER — CEFDINIR 300 MG PO CAPS
300.0000 mg | ORAL_CAPSULE | Freq: Two times a day (BID) | ORAL | 0 refills | Status: DC
  Filled 2023-07-25: qty 20, 10d supply, fill #0

## 2023-07-25 NOTE — Progress Notes (Signed)
 Virtual Visit Consent   [name], you are scheduled for a virtual visit with a [facility] provider today. Just as with appointments in the office, your consent must be obtained to participate. Your consent will be active for this visit and any virtual visit you may have with one of our providers in the next 365 days. If you have a MyChart account, a copy of this consent can be sent to you electronically.  As this is a virtual visit, video technology does not allow for your provider to perform a traditional examination. This may limit your provider's ability to fully assess your condition. If your provider identifies any concerns that need to be evaluated in person or the need to arrange testing (such as labs, EKG, etc.), we will make arrangements to do so. Although advances in technology are sophisticated, we cannot ensure that it will always work on either your end or our end. If the connection with a video visit is poor, the visit may have to be switched to a telephone visit. With either a video or telephone visit, we are not always able to ensure that we have a secure connection.  By engaging in this virtual visit, you consent to the provision of healthcare and authorize for your insurance to be billed (if applicable) for the services provided during this visit. Depending on your insurance coverage, you may receive a charge related to this service.  I need to obtain your verbal consent now. Are you willing to proceed with your visit today? [name] has provided verbal consent on 07/25/2023 for a virtual visit (video or telephone). Angelia Kelp, PA-C  Date: 07/25/2023 2:24 PM   Virtual Visit via Video Note   I, Angelia Kelp, connected with  [name]  ([id number], 1945/10/29) on 07/25/23 at  2:15 PM EDT by a video-enabled telemedicine application and verified that I am speaking with the correct person using two identifiers.  Location: Patient:  Virtual Visit Location Patient: Home Provider: Virtual Visit Location Provider: Home Office   I discussed the limitations of evaluation and management by telemedicine and the availability of in person appointments. The patient expressed understanding and agreed to proceed.    History of Present Illness: [name] is a 78 y.o. who identifies as a female who was assigned female at birth, and is being seen today for cough.  HPI: Cough This is a new problem. The current episode started in the past 7 days. The problem has been gradually worsening. The problem occurs every few minutes. The cough is Non-productive. Associated symptoms include chills, a fever (very low grade), myalgias and a sore throat (from coughing). Pertinent negatives include no ear congestion, ear pain, headaches, nasal congestion, postnasal drip, rhinorrhea, shortness of breath or wheezing. The symptoms are aggravated by lying down. Treatments tried: dayqui;, nyquil. The treatment provided no relief. Her past medical history is significant for bronchitis.     Problems:  Patient Active Problem List   Diagnosis Date Noted   DOE (dyspnea on exertion) 03/02/2023   Stage 3 chronic kidney disease (HCC) 03/02/2023   Primary osteoarthritis involving multiple joints 06/11/2022   Aortic atherosclerosis (HCC) 02/05/2022   Facial lesion 09/30/2021   Bilateral hand pain 09/30/2021   Impingement syndrome, shoulder, left 02/16/2021   Impingement syndrome, shoulder, right 09/07/2020   MDD (recurrent major depressive disorder) in remission (HCC) 09/02/2019   Generalized anxiety disorder 09/02/2019   Primary insomnia 08/16/2019   Post-menopausal 05/14/2017  Prediabetes 01/12/2017   Snoring 07/20/2016   Obsessive-compulsive personality trait 12/07/2015   Primary localized osteoarthritis of right knee    Essential hypertension 08/04/2015   Hyperlipidemia 08/04/2015   Osteopenia 08/04/2015    Allergies:  Allergies   Allergen Reactions   Penicillins Hives    Tolerates Cephalosporins    Prednisone  Other (See Comments)    Hyperactivity, sleep disturbance, confusion   Sulfa Antibiotics Hives        Medications:  Current Outpatient Medications:    cefdinir  (OMNICEF ) 300 MG capsule, Take 1 capsule (300 mg total) by mouth 2 (two) times daily., Disp: 20 capsule, Rfl: 0   amLODipine -benazepril  (LOTREL ) 5-20 MG capsule, Take 1 capsule by mouth daily., Disp: 90 capsule, Rfl: 3   atorvastatin  (LIPITOR) 20 MG tablet, Take 1 tablet (20 mg total) by mouth daily., Disp: 90 tablet, Rfl: 3   busPIRone  (BUSPAR ) 5 MG tablet, Take 1 tablet (5 mg total) by mouth 2 (two) times daily., Disp: 60 tablet, Rfl: 2   CALCIUM  PO, Take 1,200 mg by mouth daily., Disp: , Rfl:    fluticasone  (FLONASE ) 50 MCG/ACT nasal spray, Place 1 spray into both nostrils daily as needed for allergies or rhinitis., Disp: , Rfl:    lidocaine  (XYLOCAINE ) 2 % solution, Use as directed 10 mLs in the mouth or throat every 6 (six) hours as needed for mouth pain., Disp: 100 mL, Rfl: 3   Multiple Vitamin (MULTIVITAMIN ADULT PO), Take 1 tablet by mouth daily., Disp: , Rfl:    omeprazole  (PRILOSEC) 40 MG capsule, Take 1 capsule (40 mg total) by mouth daily., Disp: 30 capsule, Rfl: 3   raloxifene  (EVISTA ) 60 MG tablet, Take 1 tablet (60 mg total) by mouth daily., Disp: 90 tablet, Rfl: 3   triamterene -hydrochlorothiazide  (DYAZIDE ) 37.5-25 MG capsule, Take 1 capsule by mouth daily., Disp: 90 capsule, Rfl: 3   venlafaxine  XR (EFFEXOR -XR) 150 MG 24 hr capsule, Take 1 capsule (150 mg total) by mouth daily with breakfast., Disp: 90 capsule, Rfl: 1   zolpidem  (AMBIEN ) 5 MG tablet, Take 1.5 tablets (7.5 mg total) by mouth at bedtime as needed for sleep., Disp: 45 tablet, Rfl: 2  Observations/Objective: Patient is well-developed, well-nourished in no acute distress.  Resting comfortably at home.  Head is normocephalic, atraumatic.  No labored breathing. Speech is  clear and coherent with logical content.  Patient is alert and oriented at baseline.    Assessment and Plan: 1. Acute bacterial bronchitis (Primary) - cefdinir  (OMNICEF ) 300 MG capsule; Take 1 capsule (300 mg total) by mouth 2 (two) times daily.  Dispense: 20 capsule; Refill: 0  - Worsening over a week despite OTC medications - Will treat with Cefdinir  - Can continue Mucinex  - Push fluids.  - Rest.  - Steam and humidifier can help - Seek in person evaluation if worsening or symptoms fail to improve    Follow Up Instructions: I discussed the assessment and treatment plan with the patient. The patient was provided an opportunity to ask questions and all were answered. The patient agreed with the plan and demonstrated an understanding of the instructions.  A copy of instructions were sent to the patient via MyChart unless otherwise noted below.    The patient was advised to call back or seek an in-person evaluation if the symptoms worsen or if the condition fails to improve as anticipated.    Angelia Kelp, PA-C

## 2023-07-25 NOTE — Patient Instructions (Signed)
 [name], thank you for joining Angelia Kelp, PA-C for today's virtual visit.  While this provider is not your primary care provider (PCP), if your PCP is located in our provider database this encounter information will be shared with them immediately following your visit.   A [facility] MyChart account gives you access to today's visit and all your visits, tests, and labs performed at [facility]  click here if you don't have a [facility] MyChart account or go to [email or web address]  Consent: (Patient) [name] provided verbal consent for this virtual visit at the beginning of the encounter.  Current Medications:  Current Outpatient Medications:    cefdinir  (OMNICEF ) 300 MG capsule, Take 1 capsule (300 mg total) by mouth 2 (two) times daily., Disp: 20 capsule, Rfl: 0   amLODipine -benazepril  (LOTREL ) 5-20 MG capsule, Take 1 capsule by mouth daily., Disp: 90 capsule, Rfl: 3   atorvastatin  (LIPITOR) 20 MG tablet, Take 1 tablet (20 mg total) by mouth daily., Disp: 90 tablet, Rfl: 3   busPIRone  (BUSPAR ) 5 MG tablet, Take 1 tablet (5 mg total) by mouth 2 (two) times daily., Disp: 60 tablet, Rfl: 2   CALCIUM  PO, Take 1,200 mg by mouth daily., Disp: , Rfl:    fluticasone  (FLONASE ) 50 MCG/ACT nasal spray, Place 1 spray into both nostrils daily as needed for allergies or rhinitis., Disp: , Rfl:    lidocaine  (XYLOCAINE ) 2 % solution, Use as directed 10 mLs in the mouth or throat every 6 (six) hours as needed for mouth pain., Disp: 100 mL, Rfl: 3   Multiple Vitamin (MULTIVITAMIN ADULT PO), Take 1 tablet by mouth daily., Disp: , Rfl:    omeprazole  (PRILOSEC) 40 MG capsule, Take 1 capsule (40 mg total) by mouth daily., Disp: 30 capsule, Rfl: 3   raloxifene  (EVISTA ) 60 MG tablet, Take 1 tablet (60 mg total) by mouth daily., Disp: 90 tablet, Rfl: 3   triamterene -hydrochlorothiazide  (DYAZIDE ) 37.5-25 MG capsule, Take 1 capsule by mouth daily., Disp: 90  capsule, Rfl: 3   venlafaxine  XR (EFFEXOR -XR) 150 MG 24 hr capsule, Take 1 capsule (150 mg total) by mouth daily with breakfast., Disp: 90 capsule, Rfl: 1   zolpidem  (AMBIEN ) 5 MG tablet, Take 1.5 tablets (7.5 mg total) by mouth at bedtime as needed for sleep., Disp: 45 tablet, Rfl: 2   Medications ordered in this encounter:  Meds ordered this encounter  Medications   cefdinir  (OMNICEF ) 300 MG capsule    Sig: Take 1 capsule (300 mg total) by mouth 2 (two) times daily.    Dispense:  20 capsule    Refill:  0    Supervising Provider:   [name] [[id number]]     *If you need refills on other medications prior to your next appointment, please contact your pharmacy*  Follow-Up: Call back or seek an in-person evaluation if the symptoms worsen or if the condition fails to improve as anticipated.  [facility] [phone]  Other Instructions Acute Bronchitis, Adult  Acute bronchitis is sudden inflammation of the main airways (bronchi) that come off the windpipe (trachea) in the lungs. The swelling causes the airways to get smaller and make more mucus than normal. This can make it hard to breathe and can cause coughing or noisy breathing (wheezing). Acute bronchitis may last several weeks. The cough may last longer. Allergies, asthma, and exposure to smoke may make the condition worse. What are the causes? This condition can be caused by germs and  by substances that irritate the lungs, including: Cold and flu viruses. The most common cause of this condition is the virus that causes the common cold. Bacteria. This is less common. Breathing in substances that irritate the lungs, including: Smoke from cigarettes and other forms of tobacco. Dust and pollen. Fumes from household cleaning products, gases, or burned fuel. Indoor or outdoor air pollution. What increases the risk? The following factors may make you more likely to develop this condition: A weak body's defense  system, also called the immune system. A condition that affects your lungs and breathing, such as asthma. What are the signs or symptoms? Common symptoms of this condition include: Coughing. This may bring up clear, yellow, or green mucus from your lungs (sputum). Wheezing. Runny or stuffy nose. Having too much mucus in your lungs (chest congestion). Shortness of breath. Aches and pains, including sore throat or chest. How is this diagnosed? This condition is usually diagnosed based on: Your symptoms and medical history. A physical exam. You may also have other tests, including tests to rule out other conditions, such as pneumonia. These tests include: A test of lung function. Test of a mucus sample to look for the presence of bacteria. Tests to check the oxygen level in your blood. Blood tests. Chest X-ray. How is this treated? Most cases of acute bronchitis clear up over time without treatment. Your health care provider may recommend: Drinking more fluids to help thin your mucus so it is easier to cough up. Taking inhaled medicine (inhaler) to improve air flow in and out of your lungs. Using a vaporizer or a humidifier. These are machines that add water to the air to help you breathe better. Taking a medicine that thins mucus and clears congestion (expectorant). Taking a medicine that prevents or stops coughing (cough suppressant). It is not common to take an antibiotic medicine for this condition. Follow these instructions at home:  Take over-the-counter and prescription medicines only as told by your health care provider. Use an inhaler, vaporizer, or humidifier as told by your health care provider. Take two teaspoons (10 mL) of honey at bedtime to lessen coughing at night. Drink enough fluid to keep your urine pale yellow. Do not use any products that contain nicotine or tobacco. These products include cigarettes, chewing tobacco, and vaping devices, such as e-cigarettes. If  you need help quitting, ask your health care provider. Get plenty of rest. Return to your normal activities as told by your health care provider. Ask your health care provider what activities are safe for you. Keep all follow-up visits. This is important. How is this prevented? To lower your risk of getting this condition again: Wash your hands often with soap and water for at least 20 seconds. If soap and water are not available, use hand sanitizer. Avoid contact with people who have cold symptoms. Try not to touch your mouth, nose, or eyes with your hands. Avoid breathing in smoke or chemical fumes. Breathing smoke or chemical fumes will make your condition worse. Get the flu shot every year. Contact a health care provider if: Your symptoms do not improve after 2 weeks. You have trouble coughing up the mucus. Your cough keeps you awake at night. You have a fever. Get help right away if you: Cough up blood. Feel pain in your chest. Have severe shortness of breath. Faint or keep feeling like you are going to faint. Have a severe headache. Have a fever or chills that get worse. These symptoms may  represent a serious problem that is an emergency. Do not wait to see if the symptoms will go away. Get medical help right away. Call your local emergency services (911 in the [place] Do not drive yourself to the hospital. Summary Acute bronchitis is inflammation of the main airways (bronchi) that come off the windpipe (trachea) in the lungs. The swelling causes the airways to get smaller and make more mucus than normal. Drinking more fluids can help thin your mucus so it is easier to cough up. Take over-the-counter and prescription medicines only as told by your health care provider. Do not use any products that contain nicotine or tobacco. These products include cigarettes, chewing tobacco, and vaping devices, such as e-cigarettes. If you need help quitting, ask your health care provider. Contact  a health care provider if your symptoms do not improve after 2 weeks. This information is not intended to replace advice given to you by your health care provider. Make sure you discuss any questions you have with your health care provider. Document Revised: 05/12/2021 Document Reviewed: 06/02/2020 Elsevier Patient Education  2024 Elsevier Inc.   If you have been instructed to have an in-person evaluation today at a local Urgent Care facility, please use the link below. It will take you to a list of all of our available [facility], including address, phone number and hours of operation. Please do not delay care.  [facility]  If you or a family member do not have a primary care provider, use the link below to schedule a visit and establish care. When you choose a [facility] primary care physician or advanced practice provider, you gain a long-term partner in health. Find a Primary Care Provider  Learn more about [facility]'s in-office and virtual care options: [facility] - Get Care Now

## 2023-07-31 ENCOUNTER — Other Ambulatory Visit (HOSPITAL_COMMUNITY): Payer: Self-pay

## 2023-08-01 ENCOUNTER — Other Ambulatory Visit: Payer: Self-pay

## 2023-08-01 ENCOUNTER — Other Ambulatory Visit (HOSPITAL_COMMUNITY): Payer: Self-pay

## 2023-08-03 ENCOUNTER — Other Ambulatory Visit (HOSPITAL_COMMUNITY): Payer: Self-pay

## 2023-08-03 ENCOUNTER — Encounter: Payer: Self-pay | Admitting: Family Medicine

## 2023-08-03 ENCOUNTER — Ambulatory Visit (INDEPENDENT_AMBULATORY_CARE_PROVIDER_SITE_OTHER): Admitting: Family Medicine

## 2023-08-03 ENCOUNTER — Other Ambulatory Visit (HOSPITAL_BASED_OUTPATIENT_CLINIC_OR_DEPARTMENT_OTHER): Payer: Self-pay

## 2023-08-03 VITALS — BP 139/65 | HR 92 | Temp 97.5°F | Resp 18 | Ht 62.0 in | Wt 186.1 lb

## 2023-08-03 DIAGNOSIS — B9689 Other specified bacterial agents as the cause of diseases classified elsewhere: Secondary | ICD-10-CM | POA: Diagnosis not present

## 2023-08-03 DIAGNOSIS — E2839 Other primary ovarian failure: Secondary | ICD-10-CM | POA: Diagnosis not present

## 2023-08-03 DIAGNOSIS — J208 Acute bronchitis due to other specified organisms: Secondary | ICD-10-CM

## 2023-08-03 MED ORDER — AZITHROMYCIN 250 MG PO TABS
ORAL_TABLET | ORAL | 0 refills | Status: AC
  Filled 2023-08-03: qty 6, 5d supply, fill #0

## 2023-08-03 NOTE — Patient Instructions (Signed)
 Zpack sent.

## 2023-08-03 NOTE — Progress Notes (Signed)
 Subjective:     Patient ID: [name], female    DOB: 10-16-1945, 78 y.o.   MRN: [id number]  Chief Complaint  Patient presents with   Cough    Productive cough with yellow mucus that started a couple of weeks ago causing throat discomfort Had a e-visit, finished cefdinir  on yesterday, medication did help clear up other sx    HPI Discussed the use of AI scribe software for clinical note transcription with the patient, who gave verbal consent to proceed.  History of Present Illness [name] is a 78 year old female who presents with persistent cough and fatigue following bronchitis.  She had two sinus infections earlier this spring, treated with cefdinir . She traveled out of town over [place], during which her husband developed bronchitis. A few days later, she also developed bronchitis and was prescribed cefdinir  for ten days through a virtual consultation. Her symptoms improved but have not completely resolved.  Her current symptoms include a persistent cough producing thick yellow sputum, primarily involving her chest. She feels the illness has moved from her head to her chest, though she is breathing adequately. She experiences significant fatigue, stating 'I am so tired all the time.' No shortness of breath at rest, but she might become short of breath with increased activity. No fevers, vomiting, diarrhea, or vaginal yeast infections.  She has not been tested for COVID-19 during this illness. DayQuil provides some relief for her symptoms. Her cough is particularly bothersome in the morning, describing it as 'really repulsive.' She has been dealing with these symptoms for over three weeks and is frustrated that they have not resolved.    Health Maintenance Due  Topic Date Due   Medicare Annual Wellness (AWV)  08/08/2023    Past Medical History:  Diagnosis Date   Allergy 1980's   Sulfa drugs and penecillin   Anxiety    Cataract 2021   Corrected    Chronic kidney disease 2024   Stage 3   Depression    Headache    SINUS   Hyperlipidemia    Hypertension    Osteopenia    Osteopenia    Primary localized osteoarthritis of right knee     Past Surgical History:  Procedure Laterality Date   BREAST EXCISIONAL BIOPSY Right    benign cyst removal    CARPAL TUNNEL RELEASE Right 2015   Ortho in [place]   CHOLECYSTECTOMY N/A 02/08/2022   Procedure: LAPAROSCOPIC CHOLECYSTECTOMY WITH INTRAOPERATIVE CHOLANGIOGRAM;  Surgeon: Debby Hila, MD;  Location: [facility] ORS;  Service: General;  Laterality: N/A;   EYE SURGERY  12/2019   cataracts   FRACTURE SURGERY  2016   Rt.  leg   JOINT REPLACEMENT  09/2015   Right knee   LASIK     MENISCUS REPAIR Right 2011   [facility]thopedic in [facility]   TOTAL KNEE ARTHROPLASTY Right 09/20/2015   TOTAL KNEE ARTHROPLASTY Right 09/20/2015   Procedure: TOTAL KNEE ARTHROPLASTY;  Surgeon: Lamar Millman, MD;  Location: [facility] OR;  Service: Orthopedics;  Laterality: Right;   Trigger Thumb Right 2015   Done in [place] at the same time as the carpal tunnel release   TUBAL LIGATION  1980     Current Outpatient Medications:    amLODipine -benazepril  (LOTREL ) 5-20 MG capsule, Take 1 capsule by mouth daily., Disp: 90 capsule, Rfl: 3   atorvastatin  (LIPITOR) 20 MG tablet, Take 1 tablet (20 mg total) by mouth daily., Disp: 90 tablet, Rfl: 3   azithromycin  (ZITHROMAX )  250 MG tablet, Take 2 tablets on day 1, then 1 tablet daily on days 2 through 5, Disp: 6 tablet, Rfl: 0   busPIRone  (BUSPAR ) 5 MG tablet, Take 1 tablet (5 mg total) by mouth 2 (two) times daily., Disp: 60 tablet, Rfl: 2   CALCIUM  PO, Take 1,200 mg by mouth daily., Disp: , Rfl:    fluticasone  (FLONASE ) 50 MCG/ACT nasal spray, Place 1 spray into both nostrils daily as needed for allergies or rhinitis., Disp: , Rfl:    lidocaine  (XYLOCAINE ) 2 % solution, Use as directed 10 mLs in the mouth or throat every 6 (six) hours as needed for mouth pain., Disp: 100 mL, Rfl: 3    Multiple Vitamin (MULTIVITAMIN ADULT PO), Take 1 tablet by mouth daily., Disp: , Rfl:    omeprazole  (PRILOSEC) 40 MG capsule, Take 1 capsule (40 mg total) by mouth daily., Disp: 30 capsule, Rfl: 3   raloxifene  (EVISTA ) 60 MG tablet, Take 1 tablet (60 mg total) by mouth daily., Disp: 90 tablet, Rfl: 3   triamterene -hydrochlorothiazide  (DYAZIDE ) 37.5-25 MG capsule, Take 1 capsule by mouth daily., Disp: 90 capsule, Rfl: 3   venlafaxine  XR (EFFEXOR -XR) 150 MG 24 hr capsule, Take 1 capsule (150 mg total) by mouth daily with breakfast., Disp: 90 capsule, Rfl: 1   zolpidem  (AMBIEN ) 5 MG tablet, Take 1.5 tablets (7.5 mg total) by mouth at bedtime as needed for sleep., Disp: 45 tablet, Rfl: 2   cefdinir  (OMNICEF ) 300 MG capsule, Take 1 capsule (300 mg total) by mouth 2 (two) times daily. (Patient not taking: Reported on 08/03/2023), Disp: 20 capsule, Rfl: 0  Allergies  Allergen Reactions   Penicillins Hives    Tolerates Cephalosporins    Prednisone  Other (See Comments)    Hyperactivity, sleep disturbance, confusion   Sulfa Antibiotics Hives        ROS neg/noncontributory except as noted HPI/below      Objective:     BP 139/65   Pulse 92   Temp (!) 97.5 F (36.4 C) (Temporal)   Resp 18   Ht 5' 2 (1.575 m)   Wt 186 lb 2 oz (84.4 kg)   SpO2 97%   BMI 34.04 kg/m  Wt Readings from Last 3 Encounters:  08/03/23 186 lb 2 oz (84.4 kg)  07/18/23 186 lb (84.4 kg)  07/02/23 181 lb 2 oz (82.2 kg)    Physical Exam   Gen: WDWN NAD HEENT: NCAT, conjunctiva not injected, sclera nonicteric TM WNL B, OP moist, no exudates  NECK:  supple, no thyromegaly, no nodes,  CARDIAC: RRR, S1S2+, no murmur LUNGS: CTAB. No wheezes MSK: no gross abnormalities.  NEURO: A&O x3.  CN II-XII intact.  PSYCH: normal mood. Good eye contact     Assessment & Plan:  Acute bacterial bronchitis  Estrogen deficiency -     DG Bone Density; Future  Other orders -     Azithromycin ; Take 2 tablets on day 1, then 1  tablet daily on days 2 through 5  Dispense: 6 tablet; Refill: 0  Assessment and Plan Assessment & Plan Bronchitis   She has experienced symptoms of bronchitis, including a persistent productive cough with thick yellow sputum and fatigue, for over three weeks. There is no fever or dyspnea at rest, and she reports improvement since the initial onset. A chest x-ray was deemed unnecessary. It was explained that the cough can last up to three months. Azithromycin  (Z-Pak) was discussed to cover potential bacterial infection. She preferred to try the  Z-Pak first. Prescribe azithromycin  (Z-Pak). Consider an albuterol  inhaler with steroid if symptoms do not improve, Advise follow-up if symptoms do not improve within a week. Allergic to prednisone   Osteopenia   She is approaching osteoporosis, indicating osteopenia, and has encountered scheduling difficulties for a bone density scan. The order is confirmed in the system, and guidance was provided on scheduling at different [facility] locations. Provide a list of locations for bone density scan scheduling. Ensure the order for the bone density scan is in the system for scheduling at [facility] facilities.    Return if symptoms worsen or fail to improve.  Jenkins CHRISTELLA Carrel, MD

## 2023-08-08 NOTE — Therapy (Signed)
 OUTPATIENT PHYSICAL THERAPY EVALUATION   Patient Name: [name] MRN: [id number] DOB:[birth year], 78 y.o., female Today's Date: 08/09/2023   END OF SESSION:  PT End of Session - 08/09/23 0759     Visit Number 1    Number of Visits 9    Date for PT Re-Evaluation 10/04/23    Authorization Type MCR    Progress Note Due on Visit 10    PT Start Time 0800    PT Stop Time 0840    PT Time Calculation (min) 40 min    Activity Tolerance Patient tolerated treatment well    Behavior During Therapy [facility] for tasks assessed/performed          Past Medical History:  Diagnosis Date   Allergy 1980's   Sulfa drugs and penecillin   Anxiety    Cataract 2021   Corrected   Chronic kidney disease 2024   Stage 3   Depression    Headache    SINUS   Hyperlipidemia    Hypertension    Osteopenia    Osteopenia    Primary localized osteoarthritis of right knee    Past Surgical History:  Procedure Laterality Date   BREAST EXCISIONAL BIOPSY Right    benign cyst removal    CARPAL TUNNEL RELEASE Right 2015   Ortho in [place]   CHOLECYSTECTOMY N/A 02/08/2022   Procedure: LAPAROSCOPIC CHOLECYSTECTOMY WITH INTRAOPERATIVE CHOLANGIOGRAM;  Surgeon: Debby Hila, MD;  Location: [facility] ORS;  Service: General;  Laterality: N/A;   EYE SURGERY  12/2019   cataracts   FRACTURE SURGERY  2016   Rt.  leg   JOINT REPLACEMENT  09/2015   Right knee   LASIK     MENISCUS REPAIR Right 2011   Orthopedic in [facility]   TOTAL KNEE ARTHROPLASTY Right 09/20/2015   TOTAL KNEE ARTHROPLASTY Right 09/20/2015   Procedure: TOTAL KNEE ARTHROPLASTY;  Surgeon: Lamar Millman, MD;  Location: [facility] OR;  Service: Orthopedics;  Laterality: Right;   Trigger Thumb Right 2015   Done in [place] at the same time as the carpal tunnel release   TUBAL LIGATION  1980   Patient Active Problem List   Diagnosis Date Noted   DOE (dyspnea on exertion) 03/02/2023   Stage 3 chronic kidney disease (HCC) 03/02/2023   Primary  osteoarthritis involving multiple joints 06/11/2022   Aortic atherosclerosis (HCC) 02/05/2022   Facial lesion 09/30/2021   Bilateral hand pain 09/30/2021   Impingement syndrome, shoulder, left 02/16/2021   Impingement syndrome, shoulder, right 09/07/2020   MDD (recurrent major depressive disorder) in remission (HCC) 09/02/2019   Generalized anxiety disorder 09/02/2019   Primary insomnia 08/16/2019   Post-menopausal 05/14/2017   Prediabetes 01/12/2017   Snoring 07/20/2016   Obsessive-compulsive personality trait 12/07/2015   Primary localized osteoarthritis of right knee    Essential hypertension 08/04/2015   Hyperlipidemia 08/04/2015   Osteopenia 08/04/2015    PCP: Wendolyn Jenkins Jansky, MD  REFERRING PROVIDER: Joane Artist RAMAN, MD  REFERRING DIAG: Right hip pain  Rationale for Evaluation and Treatment: Rehabilitation  THERAPY DIAG:  Pain in right hip  Muscle weakness (generalized)  ONSET DATE: Chronic   SUBJECTIVE:              SUBJECTIVE STATEMENT: Patient reports she started noticing her right leg doesn't want to walk up steps and has difficulty with walking. She does have a right knee replacement but she feels like the leg is weak. She has noticed this for about the past month. She states when  she first stands up the first few steps are uncomfortable and she is not walking quite right. She also reports that is she sleeps on the right side it will ache as well. Prior to these issues, she was walking sporadically and doing housework for primary exercise.  PERTINENT HISTORY:  See PMH above  PAIN:  Are you having pain? Yes:  NPRS scale: 0/10 at rest, 3/10 at worst Pain location: Right hip Pain description: Ache Aggravating factors: Standing, walking Relieving factors: Rest  PRECAUTIONS: None  RED FLAGS: None   WEIGHT BEARING RESTRICTIONS: No  FALLS:  Has patient fallen in last 6 months? No  LIVING ENVIRONMENT: Lives with: lives with their spouse Lives in:  House/apartment Stairs: Yes: External: 2-3 steps; on right going up  PLOF: Independent  PATIENT GOALS: Pain relief and get stronger to improve walking and stairs   OBJECTIVE:  Note: Objective measures were completed at Evaluation unless otherwise noted. PATIENT SURVEYS:  PSFS: 5.3 Steps: 1 Bending, knealing: 6 Walking in the house: 9  COGNITION: Overall cognitive status: Within functional limits for tasks assessed     SENSATION: WFL  MUSCLE LENGTH: Hamstring grossly WFL  PALPATION: Tender to palpation at right greater trochanter  LUMBAR ROM:   Lumbar screen negative  LOWER EXTREMITY ROM:      Grossly WFL  LOWER EXTREMITY MMT:    MMT Right eval Left eval  Hip flexion 4- 4  Hip extension 3 4-  Hip abduction 3+ 4-  Hip adduction    Hip internal rotation    Hip external rotation    Knee flexion    Knee extension 4+ 5  Ankle dorsiflexion    Ankle plantarflexion    Ankle inversion    Ankle eversion     (Blank rows = not tested)  LUMBAR SPECIAL TESTS:  Lumbar screen negative  FUNCTIONAL TESTS:  30 seconds chair stand test: 7 SLS: < 5 seconds bilaterally Tandem stance: able to hold > 15 sec bilaterally with greater sway with right back  GAIT: Assistive device utilized: None Level of assistance: Complete Independence Comments: Trendelenburg   TREATMENT [facility] Adult PT Treatment:                                                DATE: 08/09/2023 Bridge 10 x 5 sec Side clamshell with green x 10 with right Sit to stand x 10 Tandem stance x 30 sec each  Discussed implementing a walking program to improve aerobic endurance and activity tolerance.   PATIENT EDUCATION:  Education details: Exam findings, POC, HEP Person educated: Patient Education method: Explanation, Demonstration, Tactile cues, Verbal cues, and Handouts Education comprehension: verbalized understanding, returned demonstration, verbal cues required, tactile cues required, and needs further  education  HOME EXERCISE PROGRAM: Access Code: [email or web address]   ASSESSMENT: CLINICAL IMPRESSION: Patient is a 78 y.o. female who was seen today for physical therapy evaluation and treatment for chronic right hip pain. She demonstrates gross strength deficits of the right hip and LE, with limitations in her mobility and single leg control that is likely contributing to her pain and impacting her functional ability.  OBJECTIVE IMPAIRMENTS: Abnormal gait, decreased activity tolerance, decreased balance, decreased strength, and pain.   ACTIVITY LIMITATIONS: lifting, standing, squatting, sleeping, stairs, and locomotion level  PARTICIPATION LIMITATIONS: meal prep, cleaning, shopping, and community activity  PERSONAL FACTORS: Fitness, Past/current experiences,  and Time since onset of injury/illness/exacerbation are also affecting patient's functional outcome.   REHAB POTENTIAL: Good  CLINICAL DECISION MAKING: Stable/uncomplicated  EVALUATION COMPLEXITY: Low   GOALS: Goals reviewed with patient? Yes  SHORT TERM GOALS: Target date: 09/06/2023  Patient will be I with initial HEP in order to progress with therapy. Baseline: HEP provided at eval Goal status: INITIAL  2.  Patient will report right hip pain </= 1/10 in roder to reduce functional limitations Baseline: 3/10 Goal status: INITIAL  LONG TERM GOALS: Target date: 10/04/2023  Patient will be I with final HEP to maintain progress from PT. Baseline: HEP provided at eval Goal status: INITIAL  2.  Patient will report PSFS >/= 7 in order to indicate improvement in their functional ability. Baseline: 5.3 Goal status: INITIAL  3.  Patient will demonstrate 30 sec stand test >/= 10 reps to indicate improved strength, mobility, and endurance Baseline: 7 reps Goal status: INITIAL  4.  Patient will be able to perform SLS >/= 10 sec each in order to improve single leg control and balance Baseline: < 5 sec each Goal status:  INITIAL   PLAN: PT FREQUENCY: 1x/week  PT DURATION: 8 weeks  PLANNED INTERVENTIONS: 97164- PT Re-evaluation, 97750- Physical Performance Testing, 97110-Therapeutic exercises, 97530- Therapeutic activity, 97112- Neuromuscular re-education, 97535- Self Care, [id number]- Manual therapy, [id number]- Gait training, [phone] (1-2 muscles), [phone] (3+ muscles)- Dry Needling, Patient/Family education, Balance training, Stair training, Taping, Joint mobilization, Joint manipulation, Spinal manipulation, Spinal mobilization, Cryotherapy, and Moist heat.  PLAN FOR NEXT SESSION: Review HEP and progress PRN, continue progression of hip and LE strengthening, balance training   Elaine Daring, PT, DPT, LAT, ATC 08/09/23  12:43 PM Phone: [phone] Fax: [phone]

## 2023-08-09 ENCOUNTER — Ambulatory Visit (INDEPENDENT_AMBULATORY_CARE_PROVIDER_SITE_OTHER): Admitting: Physical Therapy

## 2023-08-09 ENCOUNTER — Other Ambulatory Visit: Payer: Self-pay

## 2023-08-09 ENCOUNTER — Encounter: Payer: Self-pay | Admitting: Physical Therapy

## 2023-08-09 DIAGNOSIS — M6281 Muscle weakness (generalized): Secondary | ICD-10-CM

## 2023-08-09 DIAGNOSIS — M25551 Pain in right hip: Secondary | ICD-10-CM

## 2023-08-09 NOTE — Patient Instructions (Signed)
 Access Code: [email or web address] URL: [email or web address] Date: 08/09/2023 Prepared by: Elaine Daring  Exercises - Bridge  - 3-4 x weekly - 3 sets - 10 reps - Clam with Resistance  - 3-4 x weekly - 3 sets - 10 reps - Sit to Stand Without Arm Support  - 3-4 x weekly - 3 sets - 10 reps - Standing Tandem Balance with Counter Support  - 3-4 x weekly - 3 reps - 30 seconds hold

## 2023-08-10 NOTE — Progress Notes (Unsigned)
 BH MD Outpatient Progress Note  08/15/2023 11:35 AM [name]  MRN:  [id number]  Assessment:  [name] presents for follow-up evaluation. Today, 08/15/23, patient reports benefit from addition of buspirone  although did not realize it was meant to be taken BID and has only been taking in the morning. Has found it helpful during the day and amenable to increase in dose to BID as previously discussed. She reports ongoing issues with insomnia with sleep ranging from a few hours to 5 hours nightly; she reports lack of sleep has had substantial impact on mood and anxiety. She endorses significant anxiety preceding bedtime due to fear of not sleeping which then further contributes to insomnia. Discussed that CBT-I would likely be helpful in combating anxiety surrounding sleep however she declines re-engaging in therapy at this time. She is amenable to trial of low-dose Seroquel  as this may be beneficial for both sleep and associated anxiety. Risks/benefits extensively reviewed especially in light of concurrent Ambien  use; counseled on reduction in Ambien  should she experience oversedation or dizziness with the use of both medications. Encouraged regular exercise and she plans to look into options for swimming at [employer].  RTC in 2 months by video.  Identifying Information: [name] is a 78 y.o. female with a history of MDD, anxiety, arthritis, HTN, CKD3B, prediabetes who is an established patient with [facility] participating in follow-up via video conferencing. Primary focus of treatment has been on chronic insomnia which has been fairly resistant to numerous medication trials. She has participated in CBT-i. While recommendation against long-term use of Ambien  has been discussed especially in older population, efforts have been made to improve safety of overall regimen including discontinuation of Klonopin . Will continue to work towards decrease and  ultimately discontinuation of Ambien  as other strategies for management of insomnia are explored.   Plan:  # GAD  MDD in remission Past medication trials: Remeron  (appetite increase and weight gain); Klonopin  Status of problem: mild exacerbation of anxiety / mood symptoms stable Interventions: -- Continue Effexor  150 mg daily  -- Medical considerations: patient noted to have CKD3B; Cr clearance > 30 based on labs 05/28/23; no dosage adjustment indicated at this time -- INCREASE Buspar  to 5 mg BID  -- Have started at low daily dose given renal impairment and titrate based on response and tolerability  # Insomnia, chronic Past medication trials: Restoril , Klonopin , Remeron  (appetite increase and weight gain), doxepin  (ineffective), trazodone  (nausea, diarrhea, dizziness), Atarax  (jumpy), gabapentin  (dopey), doxylamine  (ineffective); Rozerem  (ineffective) Status of problem: chronic Interventions: -- START Seroquel  12.5-25 mg nightly based on efficacy/tolerability -- Risks, benefits, and side effects including but not limited to dizziness and oversedation (especially with concurrent use of Ambien ), increased appetite, constipation were reviewed with informed consent provided  -- EKG 12/27/22: Qtc 441 -- Continue Ambien  7.5 mg nightly   -- Encouraged reduction to 5 mg nightly if tolerates Seroquel              -- Risks, benefits, and side effects including but not limited to drowsiness and daytime sedation, dizziness, decreased mental alertness, falls and related injuries, increased risk for dementia was discussed              -- Have discussed plan for gradual taper and discontinuation of this medication over time given lack of indication for long term use -- Patient previously seen by CBT-i therapist [name] LCS[facility] at [facility]; patient declines interest in restarting therapy -- Sleep hygiene  extensively reviewed today: encouraged avoidance of daytime naps; incorporation of  daily exercise  -- Previously provided with local resources for [facility] and [employer] program -- Sleep study 05/10/20: mildest OSA; CPAP not recommended; could consider dental device -- Manage anxiety and mood symptoms as above    Patient was given contact information for behavioral health clinic and was instructed to call 911 for emergencies.   Subjective:  Chief Complaint:  Chief Complaint  Patient presents with   Medication Management    Interval History:   [name] reports she is currently recovering from bronchitis. Reports feeling a bit down related to ongoing insomnia. Sleep varies from almost none to 5 hours (about half the week gets 5 hours). Reports intense anxiety when she approaches bed time; explored restarting therapy given role that anxiety plays in sleep disruption. Shares that she is dubious that she would derive any further benefit from therapy. On nights she doesn't sleep, may take a nap the following day although is aware this can throw off schedule.   She is taking Buspar  however only once a day as she forgot it was meant to be taken BID. Has found it helpful for feeling perkier during the day and amenable to increase to BID.   Denies passive/active SI.   Reports increase in exercise as she has started PT for tendinitis. Trying to walk more. Identifies that on days she exercises, she feels better in general; not sure if she sleeps better however. Introduced option of swimming and looking into [employer].  Explored medication options for sleep - amenable to trial of Seroquel . Reviewed risks/benefits especially with concurrent use of Ambien  and counseled reduction in Ambien  if she finds Seroquel  helpful however experiences side effects.  PDMP: -- Zolpidem  5 mg QTY 45 last filled 08/01/23 (regular rx dating back to Jan 2023)  Visit Diagnosis:    ICD-10-CM   1. Primary insomnia  F51.01     2. MDD (recurrent major depressive disorder) in remission (HCC)  F33.40      3. Generalized anxiety disorder  F41.1       Past Psychiatric History:  Diagnoses: MDD with anxiety Medication trials: Restoril , Remeron  (appetite increase and weight gain), doxepin  (ineffective), trazodone  (nausea, diarrhea, dizziness), melatonin, Atarax  (jumpy), gabapentin  (dopey), doxylamine  (ineffective), Rozerem  (ineffective) Suicide attempts: denies Substance use: denies etoh, tobacco, or illicit drug use Caffeine: 2 diet cokes per day (one of which is with dinner)  Past Medical History:  Past Medical History:  Diagnosis Date   Allergy 1980's   Sulfa drugs and penecillin   Anxiety    Cataract 2021   Corrected   Chronic kidney disease 2024   Stage 3   Depression    Headache    SINUS   Hyperlipidemia    Hypertension    Osteopenia    Osteopenia    Primary localized osteoarthritis of right knee     Past Surgical History:  Procedure Laterality Date   BREAST EXCISIONAL BIOPSY Right    benign cyst removal    CARPAL TUNNEL RELEASE Right 2015   Ortho in [place]   CHOLECYSTECTOMY N/A 02/08/2022   Procedure: LAPAROSCOPIC CHOLECYSTECTOMY WITH INTRAOPERATIVE CHOLANGIOGRAM;  Surgeon: Debby Hila, MD;  Location: [facility] OR[facility];  Service: General;  Laterality: N/A;   EYE SURGERY  12/2019   cataracts   FRACTURE SURGERY  2016   Rt.  leg   JOINT REPLACEMENT  09/2015   Right knee   LASIK     MENISCUS REPAIR Right 2011  Orthopedic in [facility]   TOTAL KNEE ARTHROPLASTY Right 09/20/2015   TOTAL KNEE ARTHROPLASTY Right 09/20/2015   Procedure: TOTAL KNEE ARTHROPLASTY;  Surgeon: Lamar Millman, MD;  Location: [facility] OR;  Service: Orthopedics;  Laterality: Right;   Trigger Thumb Right 2015   Done in [facility] at the same time as the carpal tunnel release   TUBAL LIGATION  1980    Family Psychiatric History:  Mothe: anxiety Maternal aunt: alcohol use, anorexia   Family History:  Family History  Problem Relation Age of Onset   Heart disease Mother    Arthritis Mother    Heart  failure Mother    Heart attack Mother    Anxiety disorder Mother    Depression Mother    Hearing loss Mother    Varicose Veins Mother    Early death Father    Heart attack Father    Hearing loss Father    Heart disease Father    Hyperlipidemia Sister    Heart disease Sister    Hearing loss Sister    Arthritis Sister    Irregular heart beat Sister    Hypertension Sister    Hypertension Brother    Hyperlipidemia Brother    Heart attack Brother        Leisure centre manager (Brother)   Asthma Brother    Alcohol abuse Brother    Heart disease Brother    Early death Brother    Heart attack Brother    Depression Son    Alcohol abuse Maternal Aunt     Social History:  Social History   Socioeconomic History   Marital status: Married    Spouse name: Lawrence   Number of children: 1   Years of education: 17.5   Highest education level: Master's degree (e.g., MA, MS, MEng, MEd, MSW, MBA)  Occupational History   Occupation: retired    Comment: Comptroller  Tobacco Use   Smoking status: Never   Smokeless tobacco: Never  Vaping Use   Vaping status: Never Used  Substance and Sexual Activity   Alcohol use: Not Currently    Comment: previously 1 glass a month   Drug use: Never   Sexual activity: Yes    Birth control/protection: Post-menopausal, None  Other Topics Concern   Not on file  Social History Narrative   Reads paper, walks, housework errands. Caffeine use daily.   3 grands   Social Drivers of Corporate investment banker Strain: Low Risk  (08/02/2023)   Overall Financial Resource Strain (CARDIA)    Difficulty of Paying Living Expenses: Not hard at all  Food Insecurity: No Food Insecurity (08/02/2023)   Hunger Vital Sign    Worried About Running Out of Food in the Last Year: Never true    Ran Out of Food in the Last Year: Never true  Transportation Needs: No Transportation Needs (08/02/2023)   PRAPARE - Administrator, Civil Service (Medical): No    Lack of  Transportation (Non-Medical): No  Physical Activity: Insufficiently Active (08/02/2023)   Exercise Vital Sign    Days of Exercise per Week: 2 days    Minutes of Exercise per Session: 20 min  Stress: No Stress Concern Present (08/02/2023)   [employer] of Occupational Health - Occupational Stress Questionnaire    Feeling of Stress: Only a little  Recent Concern: Stress - Stress Concern Present (05/12/2023)   [employer] of Occupational Health - Occupational Stress Questionnaire    Feeling of Stress : To some extent  Social Connections: Moderately Isolated (08/02/2023)   Social Connection and Isolation Panel    Frequency of Communication with Friends and Family: Once a week    Frequency of Social Gatherings with Friends and Family: Once a week    Attends Religious Services: 1 to 4 times per year    Active Member of [employer] or Organizations: No    Attends Engineer, structural: Not on file    Marital Status: Married    Allergies:  Allergies  Allergen Reactions   Penicillins Hives    Tolerates Cephalosporins    Prednisone  Other (See Comments)    Hyperactivity, sleep disturbance, confusion   Sulfa Antibiotics Hives         Current Medications: Current Outpatient Medications  Medication Sig Dispense Refill   busPIRone  (BUSPAR ) 5 MG tablet Take 1 tablet (5 mg total) by mouth 2 (two) times daily. (Patient taking differently: Take 5 mg by mouth daily.) 60 tablet 2   QUEtiapine  (SEROQUEL ) 25 MG tablet Take 0.5-1 tablets (12.5-25 mg total) by mouth at bedtime. Start at 1/2 tablet and increase to full tablet if needed/tolerated. 30 tablet 1   venlafaxine  XR (EFFEXOR -XR) 150 MG 24 hr capsule Take 1 capsule (150 mg total) by mouth daily with breakfast. 90 capsule 1   zolpidem  (AMBIEN ) 5 MG tablet Take 1.5 tablets (7.5 mg total) by mouth at bedtime as needed for sleep. 45 tablet 2   amLODipine -benazepril  (LOTREL ) 5-20 MG capsule Take 1 capsule by mouth daily. 90 capsule 3    atorvastatin  (LIPITOR) 20 MG tablet Take 1 tablet (20 mg total) by mouth daily. 90 tablet 3   CALCIUM  PO Take 1,200 mg by mouth daily.     cefdinir  (OMNICEF ) 300 MG capsule Take 1 capsule (300 mg total) by mouth 2 (two) times daily. (Patient not taking: Reported on 08/03/2023) 20 capsule 0   fluticasone  (FLONASE ) 50 MCG/ACT nasal spray Place 1 spray into both nostrils daily as needed for allergies or rhinitis.     lidocaine  (XYLOCAINE ) 2 % solution Use as directed 10 mLs in the mouth or throat every 6 (six) hours as needed for mouth pain. 100 mL 3   Multiple Vitamin (MULTIVITAMIN ADULT PO) Take 1 tablet by mouth daily.     omeprazole  (PRILOSEC) 40 MG capsule Take 1 capsule (40 mg total) by mouth daily. 30 capsule 3   raloxifene  (EVISTA ) 60 MG tablet Take 1 tablet (60 mg total) by mouth daily. 90 tablet 3   triamterene -hydrochlorothiazide  (DYAZIDE ) 37.5-25 MG capsule Take 1 capsule by mouth daily. 90 capsule 3   No current facility-administered medications for this visit.    ROS: See above  Objective:  Psychiatric Specialty Exam: There were no vitals taken for this visit.There is no height or weight on file to calculate BMI.  General Appearance: Casual, Neat, and Well Groomed  Eye Contact:  Good  Speech:  Clear and Coherent and Normal Rate  Volume:  Normal  Mood:  a bit down  Affect:  Euthymic; mildly anxious  Thought Content: Denies AVH; IOR; paranoia   Suicidal Thoughts:  No  Homicidal Thoughts:  No  Thought Process:  Goal Directed and Linear  Orientation:  Full (Time, Place, and Person)    Memory:  Grossly intact  Judgment:  Good  Insight:  Good  Concentration:  Concentration: Good  Recall:  NA  Fund of Knowledge: Good  Language: Good  Psychomotor Activity:  Normal  Akathisia:  No  AIMS (if indicated): not done  Assets:  Communication Skills Desire for Improvement Financial Resources/Insurance Housing Intimacy Leisure Time Physical Health Resilience Social  Support Talents/Skills Transportation  ADL's:  Intact  Cognition: WNL  Sleep:  chronic insomnia   PE: General: sits comfortably in view of camera; no acute distress  Pulm: no increased work of breathing on room air  MSK: all extremity movements appear intact  Neuro: no focal neurological deficits observed  Gait & Station: unable to assess by video   Metabolic Disorder Labs: Lab Results  Component Value Date   HGBA1C 6.3 05/28/2023   MPG 131 01/17/2019   MPG 128 11/28/2017   No results found for: PROLACTIN Lab Results  Component Value Date   CHOL 155 05/28/2023   TRIG 110.0 05/28/2023   HDL 71.50 05/28/2023   CHOLHDL 2 05/28/2023   VLDL 22.0 05/28/2023   LDLCALC 62 05/28/2023   LDLCALC 77 04/17/2022   Lab Results  Component Value Date   TSH 1.72 05/28/2023   TSH 3.07 04/17/2022    Therapeutic Level Labs: No results found for: LITHIUM No results found for: VALPROATE No results found for: CBMZ  Screenings:  GAD-7    Flowsheet Row Office Visit from 05/28/2023 in [facility] [employer] from 11/15/2022 in [facility] [employer] [facility] [employer] from 09/27/2022 in [facility] [employer] [facility] [employer] from 06/09/2022 in [facility] [employer] [employer] from 04/17/2022 in [facility] [employer]  Total GAD-7 Score 0 7 8 16 18    PHQ2-9    Flowsheet Row Office Visit from 05/28/2023 in [facility] [employer] Visit from 05/16/2023 in [facility] [employer] from 11/15/2022 in [facility] [employer] from 09/27/2022 in [facility] Clinical Support from 08/08/2022 in [facility] [place]  PHQ-2 Total Score 0 2 2 2 2   PHQ-9 Total Score 0 9 5 10 6    Flowsheet Row  ED from 12/25/2022 in [facility] at [facility] from 12/04/2022 in [facility] at [facility] ED to Hosp-Admission (Discharged) from 02/05/2022 in [facility] EAST MEDICAL UNIT  C-SSRS RISK CATEGORY No Risk No Risk No Risk    Collaboration of Care: Collaboration of Care: Medication Management AEB ongoing medication management and Psychiatrist AEB established with this provider  Patient/Guardian was advised Release of Information must be obtained prior to any record release in order to collaborate their care with an outside provider. Patient/Guardian was advised if they have not already done so to contact the registration department to sign all necessary forms in order for us  to release information regarding their care.   Consent: Patient/Guardian gives verbal consent for treatment and assignment of benefits for services provided during this visit. Patient/Guardian expressed understanding and agreed to proceed.   Virtual Visit via Video Note  I connected with [name] on 08/15/23 at 11:00 AM EDT by a video enabled telemedicine application and verified that I am speaking with the correct person using two identifiers.  Location: Patient: home address in [place] Provider: remote office in [place]   I discussed the limitations of evaluation and management by telemedicine and the availability of in person appointments. The patient expressed understanding and agreed to proceed.   I  discussed the assessment and treatment plan with the patient. The patient was provided an opportunity to ask questions and all were answered. The patient agreed with the plan and demonstrated an understanding of the instructions.   The patient was advised to call back or seek an in-person evaluation if the symptoms worsen or if the condition fails to improve as anticipated.  I provided 25 minutes dedicated to the care of this patient via video on the date  of this encounter to include chart review, face-to-face time with the patient, medication management/counseling, documentation, review of sleep hygiene and sleep recommendations.  Ziare Orrick A Benjamin Merrihew 08/15/2023, 11:35 AM

## 2023-08-13 ENCOUNTER — Ambulatory Visit: Admitting: Family Medicine

## 2023-08-13 NOTE — Progress Notes (Deleted)
   LILLETTE Ileana Collet, PhD, LAT, ATC acting as a scribe for Artist Lloyd, MD.  [name] is a 78 y.o. female who presents to [facility] at [facility] today for R 4th finger pain. Pt was last seen by Dr. Lloyd on 07/18/23 and was advised to cont HEP as taught by OT.  Today, pt reports ***  Dx testing: 07/26/21 DEXA scan  Pertinent review of systems: ***  Relevant historical information: ***   Exam:  There were no vitals taken for this visit. General: Well Developed, well nourished, and in no acute distress.   MSK: ***    Lab and Radiology Results No results found for this or any previous visit (from the past 72 hours). No results found.     Assessment and Plan: 78 y.o. female with ***   PDMP not reviewed this encounter. No orders of the defined types were placed in this encounter.  No orders of the defined types were placed in this encounter.    Discussed warning signs or symptoms. Please see discharge instructions. Patient expresses understanding.   ***

## 2023-08-14 ENCOUNTER — Ambulatory Visit: Payer: Medicare Other

## 2023-08-15 ENCOUNTER — Encounter (HOSPITAL_COMMUNITY): Payer: Self-pay | Admitting: Psychiatry

## 2023-08-15 ENCOUNTER — Telehealth (HOSPITAL_COMMUNITY): Admitting: Psychiatry

## 2023-08-15 ENCOUNTER — Other Ambulatory Visit (HOSPITAL_COMMUNITY): Payer: Self-pay

## 2023-08-15 DIAGNOSIS — F3340 Major depressive disorder, recurrent, in remission, unspecified: Secondary | ICD-10-CM

## 2023-08-15 DIAGNOSIS — F411 Generalized anxiety disorder: Secondary | ICD-10-CM | POA: Diagnosis not present

## 2023-08-15 DIAGNOSIS — F5101 Primary insomnia: Secondary | ICD-10-CM

## 2023-08-15 MED ORDER — QUETIAPINE FUMARATE 25 MG PO TABS
12.5000 mg | ORAL_TABLET | Freq: Every day | ORAL | 1 refills | Status: DC
  Filled 2023-08-15: qty 30, 30d supply, fill #0
  Filled 2023-09-07: qty 30, 30d supply, fill #1

## 2023-08-15 NOTE — Patient Instructions (Signed)
 Thank you for attending your appointment today.  -- START Seroquel  12.5-25 mg nightly for sleep/anxiety  -- If you tolerate this medication, decrease Ambien  to 5 mg nightly -- INCREASE buspirone  to 5 mg twice daily -- Continue other medications as prescribed.  Please do not make any changes to medications without first discussing with your provider. If you are experiencing a psychiatric emergency, please call 911 or present to your nearest emergency department. Additional crisis, medication management, and therapy resources are included below.  [facility]  [address], [place], [place] [address] [phone] WALK-IN URGENT CARE 24/7 FOR ANYONE [address], [place], [place]  [phone] Fax: [phone] [email or web address] *Interpreters available *Accepts all insurance and uninsured for Urgent Care needs *Accepts Medicaid and uninsured for outpatient treatment (below)      ONLY FOR [facility]  Below:    Outpatient New Patient Assessment/Therapy Walk-ins:        Monday, Wednesday, and Thursday 8am until slots are full (first come, first served)                   New Patient Psychiatry/Medication Management        Monday-Friday 8am-11am (first come, first served)               For all walk-ins we ask that you arrive by 7:15am, because patients will be seen in the order of arrival.

## 2023-08-16 ENCOUNTER — Ambulatory Visit: Admitting: Family Medicine

## 2023-08-16 NOTE — Progress Notes (Deleted)
   LILLETTE Ileana Collet, PhD, LAT, ATC acting as a scribe for Artist Lloyd, MD.  [name] is a 78 y.o. female who presents to [facility] at [facility] today for R 4th finger pain. Pt was last seen by Dr. Lloyd on 07/18/23 and was advised to cont HEP as taught by OT.  Today, pt c/u R 4th finger pain x ***. Pt locates pain to ***  Aggravates: Treatments tried:  Dx testing: 07/26/21 DEXA scan  Pertinent review of systems: ***  Relevant historical information: ***   Exam:  There were no vitals taken for this visit. General: Well Developed, well nourished, and in no acute distress.   MSK: ***    Lab and Radiology Results No results found for this or any previous visit (from the past 72 hours). No results found.     Assessment and Plan: 78 y.o. female with ***   PDMP not reviewed this encounter. No orders of the defined types were placed in this encounter.  No orders of the defined types were placed in this encounter.    Discussed warning signs or symptoms. Please see discharge instructions. Patient expresses understanding.   ***

## 2023-08-20 ENCOUNTER — Other Ambulatory Visit: Payer: Self-pay

## 2023-08-20 ENCOUNTER — Encounter: Payer: Self-pay | Admitting: Physical Therapy

## 2023-08-20 ENCOUNTER — Ambulatory Visit (INDEPENDENT_AMBULATORY_CARE_PROVIDER_SITE_OTHER): Payer: Self-pay | Admitting: Physical Therapy

## 2023-08-20 DIAGNOSIS — M6281 Muscle weakness (generalized): Secondary | ICD-10-CM | POA: Diagnosis not present

## 2023-08-20 DIAGNOSIS — M25551 Pain in right hip: Secondary | ICD-10-CM

## 2023-08-20 NOTE — Patient Instructions (Signed)
 Access Code: [email or web address] URL: [email or web address] Date: 08/20/2023 Prepared by: Elaine Daring  Exercises - Active Straight Leg Raise with Quad Set  - 3-4 x weekly - 3 sets - 10 reps - Bridge  - 3-4 x weekly - 3 sets - 10 reps - Clam with Resistance  - 3-4 x weekly - 3 sets - 10 reps - Sit to Stand Without Arm Support  - 3-4 x weekly - 3 sets - 10 reps - Standing Tandem Balance with Counter Support  - 3-4 x weekly - 3 reps - 30 seconds hold

## 2023-08-20 NOTE — Therapy (Addendum)
 OUTPATIENT PHYSICAL THERAPY TREATMENT  DISCHARGE   Patient Name: [name] MRN: [id number] DOB:September 26, 1945, 78 y.o., female Today's Date: 08/20/2023   END OF SESSION:  PT End of Session - 08/20/23 1355     Visit Number 2    Number of Visits 9    Date for PT Re-Evaluation 10/04/23    Authorization Type MCR    Progress Note Due on Visit 10    PT Start Time 1347    PT Stop Time 1428    PT Time Calculation (min) 41 min    Activity Tolerance Patient tolerated treatment well    Behavior During Therapy [facility] for tasks assessed/performed           Past Medical History:  Diagnosis Date   Allergy 1980's   Sulfa drugs and penecillin   Anxiety    Cataract 2021   Corrected   Chronic kidney disease 2024   Stage 3   Depression    Headache    SINUS   Hyperlipidemia    Hypertension    Osteopenia    Osteopenia    Primary localized osteoarthritis of right knee    Past Surgical History:  Procedure Laterality Date   BREAST EXCISIONAL BIOPSY Right    benign cyst removal    CARPAL TUNNEL RELEASE Right 2015   Ortho in [place]   CHOLECYSTECTOMY N/A 02/08/2022   Procedure: LAPAROSCOPIC CHOLECYSTECTOMY WITH INTRAOPERATIVE CHOLANGIOGRAM;  Surgeon: Debby Hila, MD;  Location: [facility] ORS;  Service: General;  Laterality: N/A;   EYE SURGERY  12/2019   cataracts   FRACTURE SURGERY  2016   Rt.  leg   JOINT REPLACEMENT  09/2015   Right knee   LASIK     MENISCUS REPAIR Right 2011   Orthopedic in [facility]   TOTAL KNEE ARTHROPLASTY Right 09/20/2015   TOTAL KNEE ARTHROPLASTY Right 09/20/2015   Procedure: TOTAL KNEE ARTHROPLASTY;  Surgeon: Lamar Millman, MD;  Location: [facility] OR;  Service: Orthopedics;  Laterality: Right;   Trigger Thumb Right 2015   Done in [facility] at the same time as the carpal tunnel release   TUBAL LIGATION  1980   Patient Active Problem List   Diagnosis Date Noted   DOE (dyspnea on exertion) 03/02/2023   Stage 3 chronic kidney disease (HCC) 03/02/2023    Primary osteoarthritis involving multiple joints 06/11/2022   Aortic atherosclerosis (HCC) 02/05/2022   Facial lesion 09/30/2021   Bilateral hand pain 09/30/2021   Impingement syndrome, shoulder, left 02/16/2021   Impingement syndrome, shoulder, right 09/07/2020   MDD (recurrent major depressive disorder) in remission (HCC) 09/02/2019   Generalized anxiety disorder 09/02/2019   Primary insomnia 08/16/2019   Post-menopausal 05/14/2017   Prediabetes 01/12/2017   Snoring 07/20/2016   Obsessive-compulsive personality trait 12/07/2015   Primary localized osteoarthritis of right knee    Essential hypertension 08/04/2015   Hyperlipidemia 08/04/2015   Osteopenia 08/04/2015    PCP: Wendolyn Jenkins Jansky, MD  REFERRING PROVIDER: Joane Artist RAMAN, MD  REFERRING DIAG: Right hip pain  Rationale for Evaluation and Treatment: Rehabilitation  THERAPY DIAG:  Pain in right hip  Muscle weakness (generalized)  ONSET DATE: Chronic   SUBJECTIVE:              SUBJECTIVE STATEMENT: Patient reports some days are good and some days are about the same.   Eval: Patient reports she started noticing her right leg doesn't want to walk up steps and has difficulty with walking. She does have a right knee replacement but  she feels like the leg is weak. She has noticed this for about the past month. She states when she first stands up the first few steps are uncomfortable and she is not walking quite right. She also reports that is she sleeps on the right side it will ache as well. Prior to these issues, she was walking sporadically and doing housework for primary exercise.  PERTINENT HISTORY:  See PMH above  PAIN:  Are you having pain? Yes:  NPRS scale: 0/10 currently Pain location: Right hip Pain description: Ache Aggravating factors: Standing, walking Relieving factors: Rest  PRECAUTIONS: None  PATIENT GOALS: Pain relief and get stronger to improve walking and stairs   OBJECTIVE:  Note: Objective  measures were completed at Evaluation unless otherwise noted. PATIENT SURVEYS:  PSFS: 5.3 Steps: 1 Bending, knealing: 6 Walking in the house: 9  MUSCLE LENGTH: Hamstring grossly WFL  PALPATION: Tender to palpation at right greater trochanter  LOWER EXTREMITY MMT:    MMT Right eval Left eval  Hip flexion 4- 4  Hip extension 3 4-  Hip abduction 3+ 4-  Hip adduction    Hip internal rotation    Hip external rotation    Knee flexion    Knee extension 4+ 5  Ankle dorsiflexion    Ankle plantarflexion    Ankle inversion    Ankle eversion     (Blank rows = not tested)  LUMBAR SPECIAL TESTS:  Lumbar screen negative  FUNCTIONAL TESTS:  30 seconds chair stand test: 7 SLS: < 5 seconds bilaterally Tandem stance: able to hold > 15 sec bilaterally with greater sway with right back  08/20/2023: able to hold 30 seconds bilaterally  GAIT: Assistive device utilized: None Level of assistance: Complete Independence Comments: Trendelenburg   TREATMENT [facility] Adult PT Treatment:                                                DATE: 08/20/2023 Recumbent bike L3 x 5 min to improve endurance and workload capacity SLR 2 x 15 each Bridge 2 x 10 Sidelying hip abduction 3 x 10 each Sit to stand 3 x 10 LAQ with 5# 3 x 10 each Standing heel raises 3 x 10 Tandem stance 3 x 30 sec each  PATIENT EDUCATION:  Education details: HEP update Person educated: Patient Education method: Explanation, Demonstration, Tactile cues, Verbal cues, and Handouts Education comprehension: verbalized understanding, returned demonstration, verbal cues required, tactile cues required, and needs further education  HOME EXERCISE PROGRAM: Access Code: [email or web address]   ASSESSMENT: CLINICAL IMPRESSION: Patient tolerated therapy well with no adverse effects. Therapy focused on progressing her LE strengthening and balance training with good tolerance. She was able to progress with some hip strengthening and does demonstrate  improved stability in tandem stance this visit. Updated her HEP to progress some hip strengthening for home. Patient would benefit from continued skilled PT to progress mobility and strength in order to reduce pain and maximize functional ability.   Eval: Patient is a 78 y.o. female who was seen today for physical therapy evaluation and treatment for chronic right hip pain. She demonstrates gross strength deficits of the right hip and LE, with limitations in her mobility and single leg control that is likely contributing to her pain and impacting her functional ability.  OBJECTIVE IMPAIRMENTS: Abnormal gait, decreased activity tolerance, decreased balance, decreased strength,  and pain.   ACTIVITY LIMITATIONS: lifting, standing, squatting, sleeping, stairs, and locomotion level  PARTICIPATION LIMITATIONS: meal prep, cleaning, shopping, and community activity  PERSONAL FACTORS: Fitness, Past/current experiences, and Time since onset of injury/illness/exacerbation are also affecting patient's functional outcome.    GOALS: Goals reviewed with patient? Yes  SHORT TERM GOALS: Target date: 09/06/2023  Patient will be I with initial HEP in order to progress with therapy. Baseline: HEP provided at eval Goal status: INITIAL  2.  Patient will report right hip pain </= 1/10 in roder to reduce functional limitations Baseline: 3/10 Goal status: INITIAL  LONG TERM GOALS: Target date: 10/04/2023  Patient will be I with final HEP to maintain progress from PT. Baseline: HEP provided at eval Goal status: INITIAL  2.  Patient will report PSFS >/= 7 in order to indicate improvement in their functional ability. Baseline: 5.3 Goal status: INITIAL  3.  Patient will demonstrate 30 sec stand test >/= 10 reps to indicate improved strength, mobility, and endurance Baseline: 7 reps Goal status: INITIAL  4.  Patient will be able to perform SLS >/= 10 sec each in order to improve single leg control and  balance Baseline: < 5 sec each Goal status: INITIAL   PLAN: PT FREQUENCY: 1x/week  PT DURATION: 8 weeks  PLANNED INTERVENTIONS: 97164- PT Re-evaluation, 97750- Physical Performance Testing, 97110-Therapeutic exercises, 97530- Therapeutic activity, 97112- Neuromuscular re-education, 97535- Self Care, 02859- Manual therapy, [id number]- Gait training, [phone] (1-2 muscles), 20561 (3+ muscles)- Dry Needling, Patient/Family education, Balance training, Stair training, Taping, Joint mobilization, Joint manipulation, Spinal manipulation, Spinal mobilization, Cryotherapy, and Moist heat.  PLAN FOR NEXT SESSION: Review HEP and progress PRN, continue progression of hip and LE strengthening, balance training   Elaine Daring, PT, DPT, LAT, ATC 08/20/23  2:30 PM Phone: [phone] Fax: [phone]   PHYSICAL THERAPY DISCHARGE SUMMARY  Visits from Start of Care: 2  Current functional level related to goals / functional outcomes: See above   Remaining deficits: See above   Education / Equipment: HEP   Patient agrees to discharge. Patient goals were not met. Patient is being discharged due to not returning since the last visit.  Elaine Daring, PT, DPT, LAT, ATC 12/05/23  12:55 PM Phone: [phone] Fax: [phone]

## 2023-08-22 NOTE — Progress Notes (Unsigned)
   LILLETTE Ileana Collet, PhD, LAT, ATC acting as a scribe for Artist Lloyd, MD.  [name] is a 78 y.o. female who presents to [facility] at [facility] today for R 4th finger pain. Pt was last seen by Dr. Lloyd on 07/18/23 and was advised to cont HEP as taught by OT.  Today, pt c/u R 4th finger pain x ***. Pt locates pain to ***  Aggravates: Treatments tried:  Dx testing: 07/26/21 DEXA scan  Pertinent review of systems: ***  Relevant historical information: ***   Exam:  There were no vitals taken for this visit. General: Well Developed, well nourished, and in no acute distress.   MSK: ***    Lab and Radiology Results No results found for this or any previous visit (from the past 72 hours). No results found.     Assessment and Plan: 78 y.o. female with ***   PDMP not reviewed this encounter. No orders of the defined types were placed in this encounter.  No orders of the defined types were placed in this encounter.    Discussed warning signs or symptoms. Please see discharge instructions. Patient expresses understanding.   ***

## 2023-08-23 ENCOUNTER — Ambulatory Visit: Admitting: Family Medicine

## 2023-08-23 ENCOUNTER — Other Ambulatory Visit: Payer: Self-pay

## 2023-08-23 VITALS — BP 144/84 | HR 96 | Ht 62.0 in | Wt 184.0 lb

## 2023-08-23 DIAGNOSIS — M79644 Pain in right finger(s): Secondary | ICD-10-CM | POA: Diagnosis not present

## 2023-08-23 DIAGNOSIS — M79641 Pain in right hand: Secondary | ICD-10-CM

## 2023-08-23 NOTE — Patient Instructions (Signed)
 Thank you for coming in today.   You received an injection today. Seek immediate medical attention if the joint becomes red, extremely painful, or is oozing fluid.   Reminder: Dr. Joane will be out of the office starting August 1st, for about 6 weeks

## 2023-08-27 ENCOUNTER — Ambulatory Visit (INDEPENDENT_AMBULATORY_CARE_PROVIDER_SITE_OTHER): Admitting: Family Medicine

## 2023-08-27 ENCOUNTER — Ambulatory Visit: Admitting: Family Medicine

## 2023-08-27 VITALS — BP 144/84 | HR 95 | Ht 62.0 in | Wt 184.0 lb

## 2023-08-27 DIAGNOSIS — M65341 Trigger finger, right ring finger: Secondary | ICD-10-CM | POA: Diagnosis not present

## 2023-08-27 NOTE — Patient Instructions (Addendum)
 Thank you for coming in today.   Double band-aid splint  I've referred you to [facility] for a surgical consultation.  Let us  know if you don't hear from them in one week.   Please use Voltaren  gel (Generic Diclofenac  Gel) up to 4x daily for pain as needed.  This is available over-the-counter as both the name brand Voltaren  gel and the generic diclofenac  gel.   Check back as needed  Reminder: Dr. Joane will be out of the office starting August 1st, for about 6 weeks.

## 2023-08-27 NOTE — Progress Notes (Signed)
   LILLETTE Ileana Collet, PhD, LAT, ATC acting as a scribe for Artist Lloyd, MD.  [name] is a 78 y.o. female who presents to [facility] at [facility] today for cont'd bilat hand pain. Pt was last seen by Dr. Lloyd on 08/23/23 and her R 4th trigger finger was injected.  Today, pt reports R 4th finger is not any better. Triggering is still present. She reports not being happy.  Dx testing: 07/26/21 DEXA scan   Pertinent review of systems: No fevers or chills  Relevant historical information: Hypertension   Exam:  BP (!) 144/84   Pulse 95   Ht 5' 2 (1.575 m)   Wt 184 lb (83.5 kg)   SpO2 96%   BMI 33.65 kg/m  General: Well Developed, well nourished, and in no acute distress.   MSK: Right hand some bruising present at fourth palmar MCP.  Triggering present with flexion of PIP joint.     Assessment and Plan: 78 y.o. female with right hand trigger finger fourth digit.  Unfortunately not any better following trigger finger injection on July 10.  It is possible she will get some continued benefit with a bit more time and double Band-Aid splinting and Voltaren  gel.  However I am not optimistic and we will go ahead and refer to hand surgery to discuss surgical options or perhaps have a second injection after a bit of time is passed.  Will refer to [facility].   PDMP not reviewed this encounter. Orders Placed This Encounter  Procedures   Ambulatory referral to Orthopedic Surgery    Referral Priority:   Routine    Referral Type:   Surgical    Referral Reason:   Specialty Services Required    Requested Specialty:   Orthopedic Surgery    Number of Visits Requested:   1   No orders of the defined types were placed in this encounter.    Discussed warning signs or symptoms. Please see discharge instructions. Patient expresses understanding.   The above documentation has been reviewed and is accurate and complete Artist Lloyd, M.D.

## 2023-08-28 ENCOUNTER — Encounter: Admitting: Physical Therapy

## 2023-08-28 ENCOUNTER — Telehealth (HOSPITAL_COMMUNITY): Payer: Self-pay

## 2023-08-28 NOTE — Telephone Encounter (Signed)
 Pt was notified to seek an earlier appointment if provider is not able to call or get back to patient in a reasonable time.   JNL

## 2023-08-28 NOTE — Telephone Encounter (Signed)
 Please let us  know how to further assist.   JNL

## 2023-08-28 NOTE — Telephone Encounter (Signed)
 Patient called my office stating that no one is answering the phone at [address] She wants you to know that she took the Seroquel  for four days, she slept but when she woke up she felt drugged, groggy and could not concentrate. She does not want to take it. I did advise patient that it can take up to 2 weeks for a medication to regulate in the body and patient stated she was not waiting two weeks. Please review and advise, thank you

## 2023-08-29 ENCOUNTER — Ambulatory Visit: Admitting: Family Medicine

## 2023-08-30 ENCOUNTER — Telehealth (HOSPITAL_COMMUNITY): Payer: Self-pay

## 2023-08-30 NOTE — Telephone Encounter (Signed)
 Patient called office stating that she has received multiple calls from the office and would prefer the provider not to call her in the middle of the day. Patient stated best call time for her is after 6 pm. Patient stated she is no longer taking Seroquel  25 mg and will never take it again per patient.   Will send over to provider.

## 2023-09-01 ENCOUNTER — Other Ambulatory Visit (HOSPITAL_COMMUNITY): Payer: Self-pay

## 2023-09-03 ENCOUNTER — Encounter: Admitting: Physical Therapy

## 2023-09-03 ENCOUNTER — Other Ambulatory Visit: Payer: Self-pay

## 2023-09-04 DIAGNOSIS — M65342 Trigger finger, left ring finger: Secondary | ICD-10-CM | POA: Diagnosis not present

## 2023-09-11 ENCOUNTER — Other Ambulatory Visit (HOSPITAL_COMMUNITY): Payer: Self-pay

## 2023-09-14 ENCOUNTER — Other Ambulatory Visit: Payer: Self-pay | Admitting: Family Medicine

## 2023-09-14 DIAGNOSIS — K219 Gastro-esophageal reflux disease without esophagitis: Secondary | ICD-10-CM

## 2023-09-14 NOTE — Telephone Encounter (Signed)
 Patient stated she does not need medication at this time, she has enough. Rx denied.

## 2023-09-14 NOTE — Telephone Encounter (Signed)
 Left message to return my call.

## 2023-09-26 ENCOUNTER — Other Ambulatory Visit (HOSPITAL_COMMUNITY): Payer: Self-pay | Admitting: Psychiatry

## 2023-09-26 ENCOUNTER — Telehealth (HOSPITAL_COMMUNITY): Payer: Self-pay

## 2023-09-26 ENCOUNTER — Other Ambulatory Visit (HOSPITAL_COMMUNITY): Payer: Self-pay

## 2023-09-26 NOTE — Telephone Encounter (Signed)
 Patient called in requesting refill of Quetiapine  25 mg. Patient previously refused to take medication as if made her feel drugged. Patient reports she decided to give the medication another try and her symptoms have improved.    Will send Refill request to provider for consideration.

## 2023-09-27 ENCOUNTER — Other Ambulatory Visit (HOSPITAL_COMMUNITY): Payer: Self-pay

## 2023-09-30 ENCOUNTER — Other Ambulatory Visit: Payer: Self-pay | Admitting: Family Medicine

## 2023-09-30 DIAGNOSIS — K219 Gastro-esophageal reflux disease without esophagitis: Secondary | ICD-10-CM

## 2023-09-30 MED ORDER — OMEPRAZOLE 40 MG PO CPDR
40.0000 mg | DELAYED_RELEASE_CAPSULE | Freq: Every day | ORAL | 0 refills | Status: DC
  Filled 2023-09-30: qty 90, 90d supply, fill #0

## 2023-10-01 ENCOUNTER — Other Ambulatory Visit (HOSPITAL_COMMUNITY): Payer: Self-pay

## 2023-10-01 ENCOUNTER — Other Ambulatory Visit: Payer: Self-pay

## 2023-10-02 ENCOUNTER — Other Ambulatory Visit: Payer: Self-pay

## 2023-10-03 ENCOUNTER — Ambulatory Visit

## 2023-10-03 DIAGNOSIS — E2839 Other primary ovarian failure: Secondary | ICD-10-CM

## 2023-10-03 DIAGNOSIS — M810 Age-related osteoporosis without current pathological fracture: Secondary | ICD-10-CM | POA: Diagnosis not present

## 2023-10-03 DIAGNOSIS — Z780 Asymptomatic menopausal state: Secondary | ICD-10-CM | POA: Diagnosis not present

## 2023-10-03 LAB — HM DEXA SCAN

## 2023-10-04 ENCOUNTER — Telehealth (HOSPITAL_COMMUNITY): Payer: Self-pay

## 2023-10-04 ENCOUNTER — Other Ambulatory Visit (HOSPITAL_COMMUNITY): Payer: Self-pay

## 2023-10-04 ENCOUNTER — Other Ambulatory Visit (HOSPITAL_COMMUNITY): Payer: Self-pay | Admitting: Psychiatry

## 2023-10-04 ENCOUNTER — Ambulatory Visit: Payer: Self-pay | Admitting: Family Medicine

## 2023-10-04 DIAGNOSIS — F5101 Primary insomnia: Secondary | ICD-10-CM

## 2023-10-04 DIAGNOSIS — F3340 Major depressive disorder, recurrent, in remission, unspecified: Secondary | ICD-10-CM

## 2023-10-04 MED ORDER — QUETIAPINE FUMARATE 25 MG PO TABS
12.5000 mg | ORAL_TABLET | Freq: Every day | ORAL | 0 refills | Status: DC
  Filled 2023-10-04: qty 14, 14d supply, fill #0

## 2023-10-04 NOTE — Telephone Encounter (Signed)
 Patient previously called in regards to a refill of Seroquel  25 mg. Per pt she should have enough medication to last her until her appointment. Patient expressed she threw some medications out in the beginning because they made her feel heavily sedated. Patient is requesting enough medication to cover her until her appointment on 10/17/23.

## 2023-10-07 ENCOUNTER — Other Ambulatory Visit (HOSPITAL_COMMUNITY): Payer: Self-pay | Admitting: Psychiatry

## 2023-10-07 DIAGNOSIS — F5101 Primary insomnia: Secondary | ICD-10-CM

## 2023-10-08 ENCOUNTER — Other Ambulatory Visit (HOSPITAL_COMMUNITY): Payer: Self-pay

## 2023-10-08 MED ORDER — ZOLPIDEM TARTRATE 5 MG PO TABS
7.5000 mg | ORAL_TABLET | Freq: Every evening | ORAL | 0 refills | Status: DC | PRN
Start: 2023-10-08 — End: 2023-11-06
  Filled 2023-10-08: qty 45, 30d supply, fill #0

## 2023-10-11 NOTE — Progress Notes (Signed)
 Patient did not connect for virtual psychiatric medication management appointment on 10/17/23 at 10AM. Sent secure video link with no response. Called phone with no answer; left VM with callback number to reschedule.  LAURAINE DELENA PUMMEL, MD 10/17/23

## 2023-10-16 ENCOUNTER — Ambulatory Visit

## 2023-10-16 ENCOUNTER — Encounter: Payer: Self-pay | Admitting: Sports Medicine

## 2023-10-16 NOTE — Progress Notes (Signed)
 Bone density stable.  I can refer to Dr. Joane to evaluate if more needs to be done as on evisita already if she agrees

## 2023-10-17 ENCOUNTER — Observation Stay (HOSPITAL_COMMUNITY)
Admission: EM | Admit: 2023-10-17 | Discharge: 2023-10-18 | Disposition: A | Discharge status: Home / Self Care | Attending: Emergency Medicine | Admitting: Emergency Medicine

## 2023-10-17 ENCOUNTER — Emergency Department (HOSPITAL_COMMUNITY)

## 2023-10-17 ENCOUNTER — Other Ambulatory Visit: Payer: Self-pay

## 2023-10-17 ENCOUNTER — Encounter (HOSPITAL_COMMUNITY): Admitting: Psychiatry

## 2023-10-17 ENCOUNTER — Encounter (HOSPITAL_COMMUNITY): Payer: Self-pay | Admitting: Emergency Medicine

## 2023-10-17 ENCOUNTER — Encounter: Payer: Self-pay | Admitting: Family Medicine

## 2023-10-17 DIAGNOSIS — R4182 Altered mental status, unspecified: Principal | ICD-10-CM

## 2023-10-17 DIAGNOSIS — I6522 Occlusion and stenosis of left carotid artery: Secondary | ICD-10-CM | POA: Diagnosis not present

## 2023-10-17 DIAGNOSIS — I6529 Occlusion and stenosis of unspecified carotid artery: Secondary | ICD-10-CM | POA: Insufficient documentation

## 2023-10-17 DIAGNOSIS — R9082 White matter disease, unspecified: Secondary | ICD-10-CM | POA: Diagnosis not present

## 2023-10-17 DIAGNOSIS — Z6834 Body mass index (BMI) 34.0-34.9, adult: Secondary | ICD-10-CM | POA: Diagnosis not present

## 2023-10-17 DIAGNOSIS — G928 Other toxic encephalopathy: Secondary | ICD-10-CM | POA: Diagnosis not present

## 2023-10-17 DIAGNOSIS — T40711A Poisoning by cannabis, accidental (unintentional), initial encounter: Secondary | ICD-10-CM | POA: Diagnosis not present

## 2023-10-17 DIAGNOSIS — I517 Cardiomegaly: Secondary | ICD-10-CM | POA: Diagnosis not present

## 2023-10-17 DIAGNOSIS — T40715A Adverse effect of cannabis, initial encounter: Secondary | ICD-10-CM

## 2023-10-17 DIAGNOSIS — R7989 Other specified abnormal findings of blood chemistry: Secondary | ICD-10-CM | POA: Diagnosis not present

## 2023-10-17 DIAGNOSIS — R42 Dizziness and giddiness: Secondary | ICD-10-CM | POA: Diagnosis not present

## 2023-10-17 DIAGNOSIS — R404 Transient alteration of awareness: Secondary | ICD-10-CM | POA: Diagnosis not present

## 2023-10-17 DIAGNOSIS — E872 Acidosis, unspecified: Secondary | ICD-10-CM | POA: Diagnosis not present

## 2023-10-17 DIAGNOSIS — F419 Anxiety disorder, unspecified: Secondary | ICD-10-CM | POA: Insufficient documentation

## 2023-10-17 DIAGNOSIS — F418 Other specified anxiety disorders: Secondary | ICD-10-CM | POA: Insufficient documentation

## 2023-10-17 DIAGNOSIS — F1290 Cannabis use, unspecified, uncomplicated: Secondary | ICD-10-CM | POA: Diagnosis not present

## 2023-10-17 DIAGNOSIS — R410 Disorientation, unspecified: Secondary | ICD-10-CM | POA: Diagnosis not present

## 2023-10-17 DIAGNOSIS — N179 Acute kidney failure, unspecified: Secondary | ICD-10-CM | POA: Insufficient documentation

## 2023-10-17 DIAGNOSIS — D72829 Elevated white blood cell count, unspecified: Secondary | ICD-10-CM | POA: Insufficient documentation

## 2023-10-17 DIAGNOSIS — R569 Unspecified convulsions: Secondary | ICD-10-CM | POA: Diagnosis not present

## 2023-10-17 DIAGNOSIS — G9341 Metabolic encephalopathy: Secondary | ICD-10-CM | POA: Insufficient documentation

## 2023-10-17 DIAGNOSIS — F32A Depression, unspecified: Secondary | ICD-10-CM | POA: Insufficient documentation

## 2023-10-17 DIAGNOSIS — F5104 Psychophysiologic insomnia: Secondary | ICD-10-CM | POA: Insufficient documentation

## 2023-10-17 DIAGNOSIS — F12920 Cannabis use, unspecified with intoxication, uncomplicated: Secondary | ICD-10-CM | POA: Insufficient documentation

## 2023-10-17 DIAGNOSIS — E669 Obesity, unspecified: Secondary | ICD-10-CM | POA: Insufficient documentation

## 2023-10-17 DIAGNOSIS — E119 Type 2 diabetes mellitus without complications: Secondary | ICD-10-CM | POA: Diagnosis not present

## 2023-10-17 DIAGNOSIS — G4700 Insomnia, unspecified: Secondary | ICD-10-CM | POA: Insufficient documentation

## 2023-10-17 LAB — CBC WITH DIFFERENTIAL/PLATELET
Abs Immature Granulocytes: 0.28 K/uL — ABNORMAL HIGH (ref 0.00–0.07)
Basophils Absolute: 0 K/uL (ref 0.0–0.1)
Basophils Relative: 0 %
Eosinophils Absolute: 0 K/uL (ref 0.0–0.5)
Eosinophils Relative: 0 %
HCT: 39.1 % (ref 36.0–46.0)
Hemoglobin: 12.5 g/dL (ref 12.0–15.0)
Immature Granulocytes: 3 %
Lymphocytes Relative: 15 %
Lymphs Abs: 1.6 K/uL (ref 0.7–4.0)
MCH: 27.1 pg (ref 26.0–34.0)
MCHC: 32 g/dL (ref 30.0–36.0)
MCV: 84.8 fL (ref 80.0–100.0)
Monocytes Absolute: 0.6 K/uL (ref 0.1–1.0)
Monocytes Relative: 5 %
Neutro Abs: 8.7 K/uL — ABNORMAL HIGH (ref 1.7–7.7)
Neutrophils Relative %: 77 %
Platelets: 429 K/uL — ABNORMAL HIGH (ref 150–400)
RBC: 4.61 MIL/uL (ref 3.87–5.11)
RDW: 16 % — ABNORMAL HIGH (ref 11.5–15.5)
WBC: 11.3 K/uL — ABNORMAL HIGH (ref 4.0–10.5)
nRBC: 0 % (ref 0.0–0.2)

## 2023-10-17 LAB — CBC
HCT: 35.4 % — ABNORMAL LOW (ref 36.0–46.0)
Hemoglobin: 11.4 g/dL — ABNORMAL LOW (ref 12.0–15.0)
MCH: 27 pg (ref 26.0–34.0)
MCHC: 32.2 g/dL (ref 30.0–36.0)
MCV: 83.9 fL (ref 80.0–100.0)
Platelets: 391 K/uL (ref 150–400)
RBC: 4.22 MIL/uL (ref 3.87–5.11)
RDW: 15.9 % — ABNORMAL HIGH (ref 11.5–15.5)
WBC: 15.9 K/uL — ABNORMAL HIGH (ref 4.0–10.5)
nRBC: 0 % (ref 0.0–0.2)

## 2023-10-17 LAB — COMPREHENSIVE METABOLIC PANEL WITH GFR
ALT: 32 U/L (ref 0–44)
AST: 52 U/L — ABNORMAL HIGH (ref 15–41)
Albumin: 3.6 g/dL (ref 3.5–5.0)
Alkaline Phosphatase: 121 U/L (ref 38–126)
Anion gap: 23 — ABNORMAL HIGH (ref 5–15)
BUN: 18 mg/dL (ref 8–23)
CO2: 14 mmol/L — ABNORMAL LOW (ref 22–32)
Calcium: 9.2 mg/dL (ref 8.9–10.3)
Chloride: 98 mmol/L (ref 98–111)
Creatinine, Ser: 1.39 mg/dL — ABNORMAL HIGH (ref 0.44–1.00)
GFR, Estimated: 39 mL/min — ABNORMAL LOW (ref >60)
Glucose, Bld: 191 mg/dL — ABNORMAL HIGH (ref 70–99)
Potassium: 3.5 mmol/L (ref 3.5–5.1)
Sodium: 135 mmol/L (ref 135–145)
Total Bilirubin: 0.7 mg/dL (ref 0.0–1.2)
Total Protein: 7.4 g/dL (ref 6.5–8.1)

## 2023-10-17 LAB — I-STAT VENOUS BLOOD GAS, ED
Acid-base deficit: 10 mmol/L — ABNORMAL HIGH (ref 0.0–2.0)
Bicarbonate: 15.7 mmol/L — ABNORMAL LOW (ref 20.0–28.0)
Calcium, Ion: 1.12 mmol/L — ABNORMAL LOW (ref 1.15–1.40)
HCT: 39 % (ref 36.0–46.0)
Hemoglobin: 13.3 g/dL (ref 12.0–15.0)
O2 Saturation: 96 %
Potassium: 3.5 mmol/L (ref 3.5–5.1)
Sodium: 134 mmol/L — ABNORMAL LOW (ref 135–145)
TCO2: 17 mmol/L — ABNORMAL LOW (ref 22–32)
pCO2, Ven: 35.2 mmHg — ABNORMAL LOW (ref 44–60)
pH, Ven: 7.259 (ref 7.25–7.43)
pO2, Ven: 90 mmHg — ABNORMAL HIGH (ref 32–45)

## 2023-10-17 LAB — MAGNESIUM: Magnesium: 2.1 mg/dL (ref 1.7–2.4)

## 2023-10-17 LAB — TROPONIN I (HIGH SENSITIVITY)
Troponin I (High Sensitivity): 15 ng/L (ref <18)
Troponin I (High Sensitivity): 20 ng/L — ABNORMAL HIGH (ref <18)

## 2023-10-17 LAB — LIPASE, BLOOD: Lipase: 30 U/L (ref 11–51)

## 2023-10-17 LAB — CREATININE, SERUM
Creatinine, Ser: 1.01 mg/dL — ABNORMAL HIGH (ref 0.44–1.00)
GFR, Estimated: 57 mL/min — ABNORMAL LOW (ref >60)

## 2023-10-17 LAB — CBG MONITORING, ED: Glucose-Capillary: 103 mg/dL — ABNORMAL HIGH (ref 70–99)

## 2023-10-17 LAB — LACTIC ACID, PLASMA: Lactic Acid, Venous: 3.6 mmol/L (ref 0.5–1.9)

## 2023-10-17 LAB — CK: Total CK: 67 U/L (ref 38–234)

## 2023-10-17 MED ORDER — DROPERIDOL 2.5 MG/ML IJ SOLN
1.2500 mg | Freq: Once | INTRAMUSCULAR | Status: AC
  Administered 2023-10-17: 1.25 mg via INTRAMUSCULAR

## 2023-10-17 MED ORDER — LORAZEPAM 2 MG/ML IJ SOLN
2.0000 mg | Freq: Once | INTRAMUSCULAR | Status: AC
Start: 2023-10-17 — End: 2023-10-17

## 2023-10-17 MED ORDER — ZIPRASIDONE MESYLATE 20 MG IM SOLR: 20.0000 mg | Freq: Once | INTRAMUSCULAR | Status: AC

## 2023-10-17 MED ORDER — LORAZEPAM 2 MG/ML IJ SOLN: 2.0000 mg | Freq: Once | INTRAMUSCULAR | Status: DC

## 2023-10-17 MED ORDER — ZIPRASIDONE MESYLATE 20 MG IM SOLR
INTRAMUSCULAR | Status: AC
  Administered 2023-10-17: 20 mg via INTRAMUSCULAR
  Filled 2023-10-17: qty 20

## 2023-10-17 MED ORDER — ENOXAPARIN SODIUM 40 MG/0.4ML IJ SOSY
40.0000 mg | PREFILLED_SYRINGE | Freq: Every day | INTRAMUSCULAR | Status: DC
  Administered 2023-10-18: 40 mg via SUBCUTANEOUS
  Filled 2023-10-17: qty 0.4

## 2023-10-17 MED ORDER — MIDAZOLAM HCL 2 MG/2ML IJ SOLN: 4.0000 mg | Freq: Once | INTRAMUSCULAR | Status: DC

## 2023-10-17 MED ORDER — SODIUM CHLORIDE 0.9 % IV BOLUS
1000.0000 mL | Freq: Once | INTRAVENOUS | Status: AC
  Administered 2023-10-17: 1000 mL via INTRAVENOUS

## 2023-10-17 MED ORDER — LORAZEPAM 2 MG/ML IJ SOLN
INTRAMUSCULAR | Status: AC
  Administered 2023-10-17: 2 mg via INTRAMUSCULAR
  Filled 2023-10-17: qty 1

## 2023-10-17 MED ORDER — ACETAMINOPHEN 325 MG PO TABS
650.0000 mg | ORAL_TABLET | Freq: Once | ORAL | Status: AC
  Administered 2023-10-17: 650 mg via ORAL
  Filled 2023-10-17: qty 2

## 2023-10-17 MED ORDER — KETOROLAC TROMETHAMINE 15 MG/ML IJ SOLN
15.0000 mg | Freq: Once | INTRAMUSCULAR | Status: AC
  Administered 2023-10-17: 15 mg via INTRAVENOUS
  Filled 2023-10-17: qty 1

## 2023-10-17 MED ORDER — LORAZEPAM 2 MG/ML IJ SOLN: 2.0000 mg | Freq: Four times a day (QID) | INTRAMUSCULAR | Status: DC | PRN

## 2023-10-17 MED ORDER — LORAZEPAM 2 MG/ML IJ SOLN
2.0000 mg | Freq: Once | INTRAMUSCULAR | Status: AC
  Administered 2023-10-17: 2 mg via INTRAMUSCULAR
  Filled 2023-10-17: qty 1

## 2023-10-17 NOTE — H&P (Incomplete)
 History and Physical  [name] FMW:[id number] DOB: 09-29-1945 DOA: 10/17/2023  Referring physician: Dr. Elnor, EDP  PCP: Emma Jenkins Jansky, MD  Outpatient Specialists: Behavioral health. Patient coming from: Home  Chief Complaint: Altered mental status.  HPI: [name] is a 78 y.o. female with medical history significant for primary insomnia, generalized anxiety, major depressive disorder, hypertension, hyperlipidemia, GERD, who presents to the ER from home due to altered mental status.  The patient took delta 8 THC Gummies with possible excessive doses last night.  While in the ER, the patient had a witnessed seizure-like activity, tonic-clonic movements, lasting about a minute.  Associated with tongue biting and urinary incontinence.  She received 2 doses of IV Ativan .  Reportedly also had some agitation for which she received 20 mg of IM Geodon .  No reported prior history of seizures.  Noncontrast brain MRI and EEG ordered and are pending.  EDP discussed the case with neurology.  Recommendation for admission, close neuro monitoring, and observe the patient.  Admitted by [facility], hospital service.  ED Course: Temperature 98.  BP 109/56, pulse 98, respiration rate 24, O2 saturation 100% on room air.  Review of Systems: Review of systems as noted in the HPI. All other systems reviewed and are negative.   Past Medical History:  Diagnosis Date   Allergy 1980's   Sulfa drugs and penecillin   Anxiety    Cataract 2021   Corrected   Chronic kidney disease 2024   Stage 3   Depression    Headache    SINUS   Hyperlipidemia    Hypertension    Osteopenia    Osteopenia    Primary localized osteoarthritis of right knee    Past Surgical History:  Procedure Laterality Date   BREAST EXCISIONAL BIOPSY Right    benign cyst removal    CARPAL TUNNEL RELEASE Right 2015   Ortho in [place]   CHOLECYSTECTOMY N/A 02/08/2022   Procedure: LAPAROSCOPIC CHOLECYSTECTOMY WITH  INTRAOPERATIVE CHOLANGIOGRAM;  Surgeon: Emma Hila, MD;  Location: [facility] ORS;  Service: General;  Laterality: N/A;   EYE SURGERY  12/2019   cataracts   FRACTURE SURGERY  2016   Rt.  leg   JOINT REPLACEMENT  09/2015   Right knee   LASIK     MENISCUS REPAIR Right 2011   Orthopedic in [facility]   TOTAL KNEE ARTHROPLASTY Right 09/20/2015   TOTAL KNEE ARTHROPLASTY Right 09/20/2015   Procedure: TOTAL KNEE ARTHROPLASTY;  Surgeon: Emma Millman, MD;  Location: [facility] OR;  Service: Orthopedics;  Laterality: Right;   Trigger Thumb Right 2015   Done in [place] at the same time as the carpal tunnel release   TUBAL LIGATION  1980    Social History:  reports that she has never smoked. She has never used smokeless tobacco. She reports that she does not currently use alcohol. She reports that she does not use drugs.   Allergies  Allergen Reactions   Penicillins Hives    Tolerates Cephalosporins    Prednisone  Other (See Comments)    Hyperactivity, sleep disturbance, confusion   Sulfa Antibiotics Hives         Family History  Problem Relation Age of Onset   Heart disease Mother    Arthritis Mother    Heart failure Mother    Heart attack Mother    Anxiety disorder Mother    Depression Mother    Hearing loss Mother    Varicose Veins Mother    Early death Father  Heart attack Father    Hearing loss Father    Heart disease Father    Hyperlipidemia Sister    Heart disease Sister    Hearing loss Sister    Arthritis Sister    Irregular heart beat Sister    Hypertension Sister    Hypertension Brother    Hyperlipidemia Brother    Heart attack Brother        Leisure centre manager (Brother)   Asthma Brother    Alcohol abuse Brother    Heart disease Brother    Early death Brother    Heart attack Brother    Depression Son    Alcohol abuse Maternal Aunt       Prior to Admission medications   Medication Sig Start Date End Date Taking? Authorizing Provider  amLODipine -benazepril  (LOTREL ) 5-20 MG  capsule Take 1 capsule by mouth daily. 05/28/23   Emma Jenkins Jansky, MD  atorvastatin  (LIPITOR) 20 MG tablet Take 1 tablet (20 mg total) by mouth daily. 05/28/23   Emma Jenkins Jansky, MD  CALCIUM  PO Take 1,200 mg by mouth daily.    [provider]  fluticasone  (FLONASE ) 50 MCG/ACT nasal spray Place 1 spray into both nostrils daily as needed for allergies or rhinitis.    [provider]  lidocaine  (XYLOCAINE ) 2 % solution Use as directed 10 mLs in the mouth or throat every 6 (six) hours as needed for mouth pain. 05/15/22   Emma Jenkins Jansky, MD  Multiple Vitamin (MULTIVITAMIN ADULT PO) Take 1 tablet by mouth daily.    [provider]  omeprazole  (PRILOSEC) 40 MG capsule Take 1 capsule (40 mg total) by mouth daily. 09/30/23   Emma Jenkins Jansky, MD  QUEtiapine  (SEROQUEL ) 25 MG tablet Take 0.5-1 tablets (12.5-25 mg total) by mouth at bedtime. Start at 1/2 tablet and increase to full tablet if needed/tolerated. 10/04/23 12/03/23  Carvin Arvella HERO, MD  raloxifene  (EVISTA ) 60 MG tablet Take 1 tablet (60 mg total) by mouth daily. 05/28/23   Emma Jenkins Jansky, MD  triamterene -hydrochlorothiazide  (DYAZIDE ) 37.5-25 MG capsule Take 1 capsule by mouth daily. 05/28/23   Emma Jenkins Jansky, MD  venlafaxine  XR (EFFEXOR -XR) 150 MG 24 hr capsule Take 1 capsule (150 mg total) by mouth daily with breakfast. 06/13/23 12/31/23  Bahraini, Sarah A  zolpidem  (AMBIEN ) 5 MG tablet Take 1.5 tablets (7.5 mg total) by mouth at bedtime as needed for sleep. 10/08/23 11/08/23  [facility] Laur[facility] LABOR    Physical Exam: BP (!) 109/56   Pulse 87   Temp 98 F (36.7 C)   Resp (!) 24   SpO2 100%   General: 78 y.o. year-old female well developed well nourished in no acute distress.  Alert and oriented x3. Cardiovascular: Regular rate and rhythm with no rubs or gallops.  No thyromegaly or JVD noted.  No lower extremity edema. 2/4 pulses in all 4 extremities. Respiratory: Clear to auscultation with no wheezes or rales. Good  inspiratory effort. Abdomen: Soft nontender nondistended with normal bowel sounds x4 quadrants. Muskuloskeletal: No cyanosis, clubbing or edema noted bilaterally Neuro: CN II-XII intact, strength, sensation, reflexes Skin: No ulcerative lesions noted or rashes Psychiatry: Judgement and insight appear normal. Mood is appropriate for condition and setting          Labs on Admission:  Basic Metabolic Panel: Recent Labs  Lab 10/17/23 1801 10/17/23 1838  NA 135 134*  K 3.5 3.5  CL 98  --   CO2 14*  --   GLUCOSE 191*  --  BUN 18  --   CREATININE 1.39*  --   CALCIUM  9.2  --   MG 2.1  --    Liver Function Tests: Recent Labs  Lab 10/17/23 1801  AST 52*  ALT 32  ALKPHOS 121  BILITOT 0.7  PROT 7.4  ALBUMIN 3.6   Recent Labs  Lab 10/17/23 1801  LIPASE 30   No results for input(s): AMMONIA in the last 168 hours. CBC: Recent Labs  Lab 10/17/23 1801 10/17/23 1838  WBC 11.3*  --   NEUTROABS 8.7*  --   HGB 12.5 13.3  HCT 39.1 39.0  MCV 84.8  --   PLT 429*  --    Cardiac Enzymes: Recent Labs  Lab 10/17/23 1801  CKTOTAL 67    BNP (last 3 results) Recent Labs    12/25/22 1347  BNP 76.9    ProBNP (last 3 results) No results for input(s): PROBNP in the last 8760 hours.  CBG: Recent Labs  Lab 10/17/23 1742  GLUCAP 103*    Radiological Exams on Admission: DG Chest Portable 1 View Result Date: 10/17/2023 CLINICAL DATA:  Altered mental status, dizziness, chronic insomnia. EXAM: PORTABLE CHEST 1 VIEW COMPARISON:  Portable chest 12/25/2022. FINDINGS: The heart is slightly enlarged. No vascular congestion is seen. The mediastinum is normally outlined. There is mild calcification of the transverse aorta. There is a low inspiration on exam. The lungs appear generally clear but with limited view of the bases. Moderate thoracic spondylosis with osteopenia. Multiple overlying telemetry leads. IMPRESSION: 1. Low inspiration with no evidence of acute chest disease.  Limited view of the bases. 2. Aortic atherosclerosis. Electronically Signed   By: Francis Quam M.D.   On: 10/17/2023 20:09   CT Head Wo Contrast Result Date: 10/17/2023 CLINICAL DATA:  Delirium and altered mental status. EXAM: CT HEAD WITHOUT CONTRAST TECHNIQUE: Contiguous axial images were obtained from the base of the skull through the vertex without intravenous contrast. RADIATION DOSE REDUCTION: This exam was performed according to the departmental dose-optimization program which includes automated exposure control, adjustment of the mA and/or kV according to patient size and/or use of iterative reconstruction technique. COMPARISON:  None. FINDINGS: Brain: There is mild cerebral and mild-to-moderate cerebellar atrophy, early features of small-vessel disease of the cerebral white matter. The ventricles are normal in size and position. Basal cisterns are clear. No cortical based acute infarct, hemorrhage, mass or mass effect is seen. Vascular: There are calcific plaques in the carotid siphons. No hyperdense central vessel is seen. Skull: Negative for fractures or focal lesions. Hyperostosis frontalis interna. Sinuses/Orbits: No acute finding. Old lens replacements. Otherwise negative orbits. Clear sinuses and mastoids. S shaped nasal septum. Other: None. IMPRESSION: 1. No acute intracranial CT findings. 2. Atrophy and early small-vessel disease. 3. Carotid atherosclerosis. Electronically Signed   By: Francis Quam M.D.   On: 10/17/2023 20:07    EKG: I independently viewed the EKG done and my findings are as followed: Sinus rhythm rate of 64.  Nonspecific ST changes.  QTc 446.  Assessment/Plan Present on Admission: **None**  Principal Problem:   Seizure-like activity (HCC)  Seizure-like activity/new onset seizure likely secondary to [facility] use in the setting of insomnia and sleep deprivation Follow noncontrast MRI brain, EEG Monitor on telemetry Follow-up with outpatient neurology in 8 to 12  weeks. Seizure precautions As needed IV Ativan  for breakthrough seizures.  Lactic acidosis secondary to seizure activity Lactic acid 3.6 IV fluid hydration repeat lactic acid  Acute metabolic encephalopathy secondary to THC  use Monitor for now.  Patient advised to abstain from [facility] use.  Chronic anxiety/depression Resume home regimen when stable  Chronic insomnia On Ambien  for years Advised THC cessation.  Obesity BMI 34 Recommend weight loss outpatient with regular physical activity and healthy dieting.     Time: 75 minutes.   DVT prophylaxis: Subcu Lovenox  daily.  Code Status: Full code.  Family Communication: None at bedside.  Disposition Plan: Admitted to telemetry medical unit.  Consults called: Neurology.  Admission status: Observation status   Status is: Observation    Terry LOISE Hurst MD [facility] Pager [phone]  If 7PM-7AM, please contact night-coverage [email or web address] Password [facility]  10/17/2023, 10:29 PM

## 2023-10-17 NOTE — ED Triage Notes (Addendum)
 BIB [facility] from home . Husband called 911, complaints of dizziness dry mouth not acting herself Chronic insomnia. Tried Delta 8, took last night. Possibly took additional amount. Also having sensory concerns on right side. LKW 9pm last night.  104/60 60s Nsr 16 RR 112 CBG

## 2023-10-17 NOTE — ED Provider Notes (Addendum)
 [facility] EMERGENCY DEPARTMENT AT [facility] Provider Note   CSN: [id number] Arrival date & time: 10/17/23  1735     Patient presents with: Altered Mental Status   [name] is a 78 y.o. female.   Patient is a 78 year old female with past medical history of hypertension, hyperlipidemia, headaches, anxiety, and chronic kidney disease presenting for altered mental status.  Her husband states that she has been having difficulty sleeping and took a delta 8 gummy last night prior to sleeping.  He states she awoke this morning lethargic and and minimally confused but otherwise normal.  He states that she slept more and then woke up again this afternoon at approximately*with worsening altered mental status.  EMS was called to the house and reported that the patient was alert and oriented x 1 only but was able to follow commands without difficulty.  There was no focal neurological deficits noted.  No events were reported and route.  On arrival to the emergency department the patient began having seizure-like activity with her eyes deviated to the left, arms were brought into her chest, and full body stiffening.  Family reports no history of seizures.  The history is provided by the patient and the spouse. No language interpreter was used.  Altered Mental Status      Prior to Admission medications   Medication Sig Start Date End Date Taking? Authorizing Provider  amLODipine -benazepril  (LOTREL ) 5-20 MG capsule Take 1 capsule by mouth daily. 05/28/23   Wendolyn Jenkins Jansky, MD  atorvastatin  (LIPITOR) 20 MG tablet Take 1 tablet (20 mg total) by mouth daily. 05/28/23   Wendolyn Jenkins Jansky, MD  CALCIUM  PO Take 1,200 mg by mouth daily.    [provider]  fluticasone  (FLONASE ) 50 MCG/ACT nasal spray Place 1 spray into both nostrils daily as needed for allergies or rhinitis.    [provider]  lidocaine  (XYLOCAINE ) 2 % solution Use as directed 10 mLs in the mouth or  throat every 6 (six) hours as needed for mouth pain. 05/15/22   Wendolyn Jenkins Jansky, MD  Multiple Vitamin (MULTIVITAMIN ADULT PO) Take 1 tablet by mouth daily.    [provider]  omeprazole  (PRILOSEC) 40 MG capsule Take 1 capsule (40 mg total) by mouth daily. 09/30/23   Wendolyn Jenkins Jansky, MD  QUEtiapine  (SEROQUEL ) 25 MG tablet Take 0.5-1 tablets (12.5-25 mg total) by mouth at bedtime. Start at 1/2 tablet and increase to full tablet if needed/tolerated. 10/04/23 12/03/23  Carvin Arvella HERO, MD  raloxifene  (EVISTA ) 60 MG tablet Take 1 tablet (60 mg total) by mouth daily. 05/28/23   Wendolyn Jenkins Jansky, MD  triamterene -hydrochlorothiazide  (DYAZIDE ) 37.5-25 MG capsule Take 1 capsule by mouth daily. 05/28/23   Wendolyn Jenkins Jansky, MD  venlafaxine  XR (EFFEXOR -XR) 150 MG 24 hr capsule Take 1 capsule (150 mg total) by mouth daily with breakfast. 06/13/23 12/31/23  Bahraini, Sarah A  zolpidem  (AMBIEN ) 5 MG tablet Take 1.5 tablets (7.5 mg total) by mouth at bedtime as needed for sleep. 10/08/23 11/08/23  Bahraini, Sarah A    Allergies: Penicillins, Prednisone , and Sulfa antibiotics    Review of Systems  Updated Vital Signs BP (!) 135/59   Pulse 88   Temp 98 F (36.7 C)   Resp 18   SpO2 100%   Physical Exam  (all labs ordered are listed, but only abnormal results are displayed) Labs Reviewed  CBC WITH DIFFERENTIAL/PLATELET - Abnormal; Notable for the following components:      Result  Value   WBC 11.3 (*)    RDW 16.0 (*)    Platelets 429 (*)    Neutro Abs 8.7 (*)    Abs Immature Granulocytes 0.28 (*)    All other components within normal limits  COMPREHENSIVE METABOLIC PANEL WITH GFR - Abnormal; Notable for the following components:   CO2 14 (*)    Glucose, Bld 191 (*)    Creatinine, Ser 1.39 (*)    AST 52 (*)    GFR, Estimated 39 (*)    Anion gap 23 (*)    All other components within normal limits  LACTIC ACID, PLASMA - Abnormal; Notable for the following components:   Lactic Acid, Venous 3.6  (*)    All other components within normal limits  CBG MONITORING, ED - Abnormal; Notable for the following components:   Glucose-Capillary 103 (*)    All other components within normal limits  I-STAT VENOUS BLOOD GAS, ED - Abnormal; Notable for the following components:   pCO2, Ven 35.2 (*)    pO2, Ven 90 (*)    Bicarbonate 15.7 (*)    TCO2 17 (*)    Acid-base deficit 10.0 (*)    Sodium 134 (*)    Calcium , Ion 1.12 (*)    All other components within normal limits  TROPONIN I (HIGH SENSITIVITY) - Abnormal; Notable for the following components:   Troponin I (High Sensitivity) 20 (*)    All other components within normal limits  LIPASE, BLOOD  MAGNESIUM   CK  RAPID URINE DRUG SCREEN, HOSP PERFORMED  URINALYSIS, ROUTINE W REFLEX MICROSCOPIC  ACETAMINOPHEN  LEVEL  SALICYLATE LEVEL  LACTIC ACID, PLASMA  TROPONIN I (HIGH SENSITIVITY)    EKG: None  Radiology: DG Chest Portable 1 View Result Date: 10/17/2023 CLINICAL DATA:  Altered mental status, dizziness, chronic insomnia. EXAM: PORTABLE CHEST 1 VIEW COMPARISON:  Portable chest 12/25/2022. FINDINGS: The heart is slightly enlarged. No vascular congestion is seen. The mediastinum is normally outlined. There is mild calcification of the transverse aorta. There is a low inspiration on exam. The lungs appear generally clear but with limited view of the bases. Moderate thoracic spondylosis with osteopenia. Multiple overlying telemetry leads. IMPRESSION: 1. Low inspiration with no evidence of acute chest disease. Limited view of the bases. 2. Aortic atherosclerosis. Electronically Signed   By: Francis Quam M.D.   On: 10/17/2023 20:09   CT Head Wo Contrast Result Date: 10/17/2023 CLINICAL DATA:  Delirium and altered mental status. EXAM: CT HEAD WITHOUT CONTRAST TECHNIQUE: Contiguous axial images were obtained from the base of the skull through the vertex without intravenous contrast. RADIATION DOSE REDUCTION: This exam was performed according to  the departmental dose-optimization program which includes automated exposure control, adjustment of the mA and/or kV according to patient size and/or use of iterative reconstruction technique. COMPARISON:  None. FINDINGS: Brain: There is mild cerebral and mild-to-moderate cerebellar atrophy, early features of small-vessel disease of the cerebral white matter. The ventricles are normal in size and position. Basal cisterns are clear. No cortical based acute infarct, hemorrhage, mass or mass effect is seen. Vascular: There are calcific plaques in the carotid siphons. No hyperdense central vessel is seen. Skull: Negative for fractures or focal lesions. Hyperostosis frontalis interna. Sinuses/Orbits: No acute finding. Old lens replacements. Otherwise negative orbits. Clear sinuses and mastoids. S shaped nasal septum. Other: None. IMPRESSION: 1. No acute intracranial CT findings. 2. Atrophy and early small-vessel disease. 3. Carotid atherosclerosis. Electronically Signed   By: Francis Quam HERO.D.  On: 10/17/2023 20:07     .Critical Care  Performed by: Elnor Bernarda SQUIBB, DO Authorized by: Elnor Bernarda SQUIBB, DO   Critical care provider statement:    Critical care time (minutes):  74   Critical care was time spent personally by me on the following activities:  Development of treatment plan with patient or surrogate, discussions with consultants, evaluation of patient's response to treatment, examination of patient, ordering and review of laboratory studies, ordering and review of radiographic studies, ordering and performing treatments and interventions, pulse oximetry, re-evaluation of patient's condition and review of old charts   Care discussed with: admitting provider     Care discussed with comment:  Neurology    Medications Ordered in the ED  ziprasidone  (GEODON ) injection 20 mg (20 mg Intramuscular Given 10/17/23 1830)  LORazepam  (ATIVAN ) injection 2 mg (2 mg Intramuscular Given 10/17/23 1830)  LORazepam   (ATIVAN ) injection 2 mg (2 mg Intramuscular Given 10/17/23 1830)  droperidol  (INAPSINE ) 2.5 MG/ML injection 1.25 mg (1.25 mg Intramuscular Given 10/17/23 1831)  sodium chloride  0.9 % bolus 1,000 mL (0 mLs Intravenous Stopped 10/17/23 2149)  ketorolac  (TORADOL ) 15 MG/ML injection 15 mg (15 mg Intravenous Given 10/17/23 2104)  acetaminophen  (TYLENOL ) tablet 650 mg (650 mg Oral Given 10/17/23 2104)    Clinical Course as of 10/17/23 2214  Wed Oct 17, 2023  1824 Called to room for and actively seizing patient.  EMS on scene dropping patient off.  Was reported to have taken delta 8 cannabis gummy last night and was found to be altered and confused this morning at 9 AM by family members.  She was not immediately brought to the emergency department due to concerns for intoxication.  At approximately 4 to 5 PM they called EMS because patient was still altered.  They reported that she was alert and oriented x 1 only on arrival with no focal neurological deficits.  Pleasantly confused.  No events and route.  Upon arrival to the ED patient began having seizure-like activity with eyes deviated to the right, arm spread up to her chest, and stiff limb of the body.  This lasted approximately 1 minute before resolving completely before medical intervention.  Patient remained altered and attempting to get out of bed.  2 mg Ativan  IM given.  No improvement.  Geodon  20 mg IM given.  No improvement.  Unable to get lab work or safely send patient to CT.  2 mg of Ativan  given. [AG]  1826 IV established.  Blood work and toxicology ordered and sent.  Sent [AG]  1828 IV established, labs ordered and sent. [AG]  [id number] Patient still altered and attempting to get out of bed.  Concerned that patient may have an acute pathology in addition [AG]  1835 CT called and unable to get patient.  Nurse will be attending. [AG]    Clinical Course User Index [AG] Elnor Bernarda SQUIBB, DO                                 Medical Decision Making Amount  and/or Complexity of Data Reviewed Labs: ordered. Radiology: ordered.  Risk OTC drugs. Prescription drug management. Decision regarding hospitalization.   Please read HPI and ED clinical course for further details.  Patient's seizure was reported to be as first-time seizure.  It lasted approximately 1 minute prior to resolving spontaneously while the nurse was still trying to get the Ativan  drawn up.  She was  extremely postictal afterward, attempting to crawl out of bed, grabbing at nurses, not following commands.  She had to receive multiple doses of Ativan  and Geodon  in an attempt to receive labs and get her to CT to rule out any acute life-threatening etiologies of first-time seizure.  He did have evidence of tongue biting as well as urinary incontinence.  CT head demonstrates no acute process. Labs concerning for lactic acidosis of 3.6 likely secondary to the seizure.  IV fluids are initiated.  CPK is stable.  Anion gap of 23 present.  Creatinine as per baseline.  Mild leukocytosis but no signs or symptoms of sepsis. Acetaminophen  negative Salicylate negative UA and UDS pending MRI brain without ordered and pending  Neurology consulted and recommends admission to hospitalist group for 24-hour monitoring, EEG, and MRI brain without.  Patient accepted for admission by Dr. Shona.  Family agreeable to admission.    Final diagnoses:  Altered mental status, unspecified altered mental status type  Seizure (HCC)  Lactic acidosis    ED Discharge Orders     None          Elnor Bernarda SQUIBB, DO 10/17/23 2214    Elnor Bernarda P, DO 10/17/23 2218

## 2023-10-17 NOTE — ED Notes (Addendum)
 Witnessed seizure-like activity with this patient. Bit her tongue during the episode. Dr. Bernarda Pereyra made aware. 2mg  Ativan  IM administered. 20 mg Geodon  given IM due to altered mental status.

## 2023-10-17 NOTE — Consult Note (Signed)
 NEUROLOGY CONSULT NOTE   Date of service: October 17, 2023 Patient Name: [name] MRN:  [id number] DOB:  Sep 29, 1945 Chief Complaint: Altered mental status Requesting Provider: Shona Terry SAILOR, DO  History of Present Illness  [name] is a 78 y.o. female with hx of anxiety, depression, hypertension, hyperlipidemia, insomnia, presented to the emergency department for evaluation of altered mental status According to the husband, she took a THC/delta 8 gummy last night to help with insomnia, was more lethargic this morning, he later sleep thinking it might be effective medication but she was more confused until this afternoon and her mental status would not improve for which EMS was called.  She was only oriented to self when she would wake up but kept falling asleep at that time.  There were no focal deficits.  While in the ER, she had witnessed seizure-like activity with clenching of her arms, whole body stiffening and tongue bite that lasted about 1 minute.  This resolved prior to Ativan  administration.  Neurology was consulted. No prior history of strokes.  No history of head injury. Significant history of insomnia per husband. Patient denying any current symptoms at this time but still reports not feeling like her baseline self overall.   ROS  Comprehensive ROS performed and pertinent positives documented in HPI    Past History   Past Medical History:  Diagnosis Date   Allergy 1980's   Sulfa drugs and penecillin   Anxiety    Cataract 2021   Corrected   Chronic kidney disease 2024   Stage 3   Depression    Headache    SINUS   Hyperlipidemia    Hypertension    Osteopenia    Osteopenia    Primary localized osteoarthritis of right knee     Past Surgical History:  Procedure Laterality Date   BREAST EXCISIONAL BIOPSY Right    benign cyst removal    CARPAL TUNNEL RELEASE Right 2015   Ortho in [place]   CHOLECYSTECTOMY N/A 02/08/2022   Procedure:  LAPAROSCOPIC CHOLECYSTECTOMY WITH INTRAOPERATIVE CHOLANGIOGRAM;  Surgeon: Debby Hila, MD;  Location: [facility];  Service: General;  Laterality: N/A;   EYE SURGERY  12/2019   cataracts   FRACTURE SURGERY  2016   Rt.  leg   JOINT REPLACEMENT  09/2015   Right knee   LASIK     MENISCUS REPAIR Right 2011   Orthopedic in [facility]   TOTAL KNEE ARTHROPLASTY Right 09/20/2015   TOTAL KNEE ARTHROPLASTY Right 09/20/2015   Procedure: TOTAL KNEE ARTHROPLASTY;  Surgeon: Lamar Millman, MD;  Location: [facility] OR;  Service: Orthopedics;  Laterality: Right;   Trigger Thumb Right 2015   Done in [place] at the same time as the carpal tunnel release   TUBAL LIGATION  1980    Family History: Family History  Problem Relation Age of Onset   Heart disease Mother    Arthritis Mother    Heart failure Mother    Heart attack Mother    Anxiety disorder Mother    Depression Mother    Hearing loss Mother    Varicose Veins Mother    Early death Father    Heart attack Father    Hearing loss Father    Heart disease Father    Hyperlipidemia Sister    Heart disease Sister    Hearing loss Sister    Arthritis Sister    Irregular heart beat Sister    Hypertension Sister    Hypertension Brother  Hyperlipidemia Brother    Heart attack Brother        Emma (Brother)   Asthma Brother    Alcohol abuse Brother    Heart disease Brother    Early death Brother    Heart attack Brother    Depression Son    Alcohol abuse Maternal Aunt     Social History  reports that she has never smoked. She has never used smokeless tobacco. She reports that she does not currently use alcohol. She reports that she does not use drugs.  Allergies  Allergen Reactions   Penicillins Hives    Tolerates Cephalosporins    Prednisone  Other (See Comments)    Hyperactivity, sleep disturbance, confusion   Sulfa Antibiotics Hives         Medications   Current Facility-Administered Medications:    enoxaparin  (LOVENOX ) injection 40  mg, 40 mg, Subcutaneous, Q24H, Hall, Carole N, DO  Current Outpatient Medications:    amLODipine -benazepril  (LOTREL ) 5-20 MG capsule, Take 1 capsule by mouth daily., Disp: 90 capsule, Rfl: 3   atorvastatin  (LIPITOR) 20 MG tablet, Take 1 tablet (20 mg total) by mouth daily., Disp: 90 tablet, Rfl: 3   CALCIUM  PO, Take 1,200 mg by mouth daily., Disp: , Rfl:    fluticasone  (FLONASE ) 50 MCG/ACT nasal spray, Place 1 spray into both nostrils daily as needed for allergies or rhinitis., Disp: , Rfl:    lidocaine  (XYLOCAINE ) 2 % solution, Use as directed 10 mLs in the mouth or throat every 6 (six) hours as needed for mouth pain., Disp: 100 mL, Rfl: 3   Multiple Vitamin (MULTIVITAMIN ADULT PO), Take 1 tablet by mouth daily., Disp: , Rfl:    omeprazole  (PRILOSEC) 40 MG capsule, Take 1 capsule (40 mg total) by mouth daily., Disp: 90 capsule, Rfl: 0   QUEtiapine  (SEROQUEL ) 25 MG tablet, Take 0.5-1 tablets (12.5-25 mg total) by mouth at bedtime. Start at 1/2 tablet and increase to full tablet if needed/tolerated., Disp: 14 tablet, Rfl: 0   raloxifene  (EVISTA ) 60 MG tablet, Take 1 tablet (60 mg total) by mouth daily., Disp: 90 tablet, Rfl: 3   triamterene -hydrochlorothiazide  (DYAZIDE ) 37.5-25 MG capsule, Take 1 capsule by mouth daily., Disp: 90 capsule, Rfl: 3   venlafaxine  XR (EFFEXOR -XR) 150 MG 24 hr capsule, Take 1 capsule (150 mg total) by mouth daily with breakfast., Disp: 90 capsule, Rfl: 1   zolpidem  (AMBIEN ) 5 MG tablet, Take 1.5 tablets (7.5 mg total) by mouth at bedtime as needed for sleep., Disp: 45 tablet, Rfl: 0  Vitals   Vitals:   2023/11/15 2100 2023/11/15 2115 2023/11/15 2130 11/15/2023 2145  BP:  (!) 154/136 (!) 152/132 (!) 109/56  Pulse: 85 84 (!) 105 87  Resp:   (!) 27 (!) 24  Temp:      SpO2: (!) 79% 100% (!) 81% 100%    There is no height or weight on file to calculate BMI.   Physical Exam   General: Awake alert in no distress HEENT: Normocephalic atraumatic Lungs:  Clear Cardiovascular: Regular rhythm Abdomen nondistended nontender Neurological exam Awake alert oriented x 3 Mildly diminished attention concentration No dysarthria No aphasia Cranial nerves II to XII intact Motor examination with no drift in any of the 4 extremities Sensation intact light touch Coordination examination reveals no dysmetria   Labs/Imaging/Neurodiagnostic studies   CBC:  Recent Labs  Lab November 15, 2023 1801 2023-11-15 1838  WBC 11.3*  --   NEUTROABS 8.7*  --   HGB 12.5 13.3  HCT 39.1  39.0  MCV 84.8  --   PLT 429*  --    Basic Metabolic Panel:  Lab Results  Component Value Date   NA 134 (L) 10/17/2023   K 3.5 10/17/2023   CO2 14 (L) 10/17/2023   GLUCOSE 191 (H) 10/17/2023   BUN 18 10/17/2023   CREATININE 1.39 (H) 10/17/2023   CALCIUM  9.2 10/17/2023   GFRNONAA 39 (L) 10/17/2023   GFRAA 53 (L) 06/30/2020   Lipid Panel:  Lab Results  Component Value Date   LDLCALC 62 05/28/2023   HgbA1c:  Lab Results  Component Value Date   HGBA1C 6.3 05/28/2023   Alcohol Level No results found for: [facility] INR  Lab Results  Component Value Date   INR 1.03 09/10/2015   APTT  Lab Results  Component Value Date   APTT 25 09/10/2015  UDS pending  Personally reviewed imaging: CT head-no acute findings  Neurodiagnostics rEEG:  Ordered  ASSESSMENT   Rebeckah Masih is a 78 y.o. female past history of anxiety, depression, hypertension, hyperlipidemia, insomnia who presented for evaluation of altered mental status after ingestion of THC/delta 8 gummy yesterday, then had a witnessed tonic-clonic seizure lasting about 1 minute in the ED. She is currently returning back to baseline but subjectively feels that she is not at her 100% mental baseline yet. I suspect that her altered mental status and seizure are both result of THC ingestion which might of lowered her seizure threshold.  Impression: Toxic metabolic encephalopathy in the setting of THC use New  onset seizure-likely due to lowered seizure threshold in the setting of THC use and insomnia/sleep deprivation  RECOMMENDATIONS  At this time, I would recommend close neuromonitoring and observation Routine EEG MRI brain without contrast I would hold off on using any antiepileptics at this time since this was likely a first and provoked event of seizure activity. I have counseled her against using THC.  Inpatient neurology service will follow the above studies with you, otherwise will be available as needed.  Patient can follow-up with outpatient neurology in 8 to 12 weeks.  Plan was relayed to admitting hospitalist Dr. Shona ______________________________________________________________________    Signed, Eligio Lav, MD [facility]

## 2023-10-18 ENCOUNTER — Observation Stay (HOSPITAL_COMMUNITY)

## 2023-10-18 ENCOUNTER — Other Ambulatory Visit: Payer: Self-pay

## 2023-10-18 DIAGNOSIS — R569 Unspecified convulsions: Secondary | ICD-10-CM | POA: Diagnosis not present

## 2023-10-18 LAB — CBC
HCT: 36.1 % (ref 36.0–46.0)
Hemoglobin: 11.7 g/dL — ABNORMAL LOW (ref 12.0–15.0)
MCH: 26.8 pg (ref 26.0–34.0)
MCHC: 32.4 g/dL (ref 30.0–36.0)
MCV: 82.8 fL (ref 80.0–100.0)
Platelets: 390 K/uL (ref 150–400)
RBC: 4.36 MIL/uL (ref 3.87–5.11)
RDW: 16.1 % — ABNORMAL HIGH (ref 11.5–15.5)
WBC: 12.8 K/uL — ABNORMAL HIGH (ref 4.0–10.5)
nRBC: 0 % (ref 0.0–0.2)

## 2023-10-18 LAB — CBG MONITORING, ED
Glucose-Capillary: 126 mg/dL — ABNORMAL HIGH (ref 70–99)
Glucose-Capillary: 136 mg/dL — ABNORMAL HIGH (ref 70–99)
Glucose-Capillary: 111 mg/dL — ABNORMAL HIGH (ref 70–99)

## 2023-10-18 LAB — BASIC METABOLIC PANEL WITH GFR
Anion gap: 16 — ABNORMAL HIGH (ref 5–15)
BUN: 19 mg/dL (ref 8–23)
CO2: 19 mmol/L — ABNORMAL LOW (ref 22–32)
Calcium: 8.9 mg/dL (ref 8.9–10.3)
Chloride: 103 mmol/L (ref 98–111)
Creatinine, Ser: 1.07 mg/dL — ABNORMAL HIGH (ref 0.44–1.00)
GFR, Estimated: 53 mL/min — ABNORMAL LOW (ref >60)
Glucose, Bld: 112 mg/dL — ABNORMAL HIGH (ref 70–99)
Potassium: 3.7 mmol/L (ref 3.5–5.1)
Sodium: 138 mmol/L (ref 135–145)

## 2023-10-18 LAB — MAGNESIUM: Magnesium: 2.1 mg/dL (ref 1.7–2.4)

## 2023-10-18 LAB — PHOSPHORUS: Phosphorus: 3.2 mg/dL (ref 2.5–4.6)

## 2023-10-18 MED ORDER — ACETAMINOPHEN 325 MG PO TABS: 650.0000 mg | ORAL_TABLET | Freq: Four times a day (QID) | ORAL | Status: DC | PRN

## 2023-10-18 MED ORDER — VENLAFAXINE HCL ER 75 MG PO CP24
150.0000 mg | ORAL_CAPSULE | Freq: Every day | ORAL | Status: DC
  Administered 2023-10-18: 150 mg via ORAL
  Filled 2023-10-18: qty 2

## 2023-10-18 MED ORDER — INSULIN ASPART 100 UNIT/ML IJ SOLN
0.0000 [IU] | Freq: Three times a day (TID) | INTRAMUSCULAR | Status: DC
  Administered 2023-10-18: 1 [IU] via SUBCUTANEOUS

## 2023-10-18 MED ORDER — POLYETHYLENE GLYCOL 3350 17 G PO PACK: 17.0000 g | PACK | Freq: Every day | ORAL | Status: DC | PRN

## 2023-10-18 MED ORDER — INSULIN ASPART 100 UNIT/ML IJ SOLN: 0.0000 [IU] | Freq: Every day | INTRAMUSCULAR | Status: DC

## 2023-10-18 MED ORDER — MELATONIN 5 MG PO TABS: 5.0000 mg | ORAL_TABLET | Freq: Every evening | ORAL | Status: DC | PRN

## 2023-10-18 MED ORDER — PROCHLORPERAZINE EDISYLATE 10 MG/2ML IJ SOLN: 5.0000 mg | Freq: Four times a day (QID) | INTRAMUSCULAR | Status: DC | PRN

## 2023-10-18 MED ORDER — SODIUM CHLORIDE 0.9 % IV SOLN: INTRAVENOUS | Status: DC

## 2023-10-18 MED ORDER — PANTOPRAZOLE SODIUM 40 MG PO TBEC
40.0000 mg | DELAYED_RELEASE_TABLET | Freq: Every day | ORAL | Status: DC
  Administered 2023-10-18: 40 mg via ORAL
  Filled 2023-10-18: qty 1

## 2023-10-18 NOTE — ED Notes (Signed)
   10/18/23 1302  Orthostatic Lying   BP- Lying 163/71  Pulse- Lying 94  Orthostatic Sitting  BP- Sitting 153/79  Pulse- Sitting 96  Orthostatic Standing at 0 minutes  BP- Standing at 0 minutes 151/85  Pulse- Standing at 0 minutes 103  Orthostatic Standing at 3 minutes  BP- Standing at 3 minutes 151/85  Pulse- Standing at 3 minutes 109

## 2023-10-18 NOTE — Progress Notes (Signed)
 EEG complete - results pending

## 2023-10-18 NOTE — Plan of Care (Signed)
 MRI neg.  EEG: no seizures or interictal activity.  Likely provoked seizures from [facility]  No AEDs.  Close f/u with Neurology outpt. No driving until cleared by neuro outpt.    Neurology will sign off.

## 2023-10-18 NOTE — Discharge Summary (Signed)
 Physician Discharge Summary  [name] FMW:[id number] DOB: 10/29/1945 DOA: 10/17/2023  PCP: Wendolyn Jenkins Jansky, MD  Admit date: 10/17/2023 Discharge date: 10/18/2023  Time spent: 40 minutes  Recommendations for Outpatient Follow-up:  Follow outpatient CBC/CMP  Follow with neurology outpatient  Seizures precautions - no driving at least 6 months Avoid [facility]   Discharge Diagnoses:  Principal Problem:   Seizure-like activity [name])   Discharge Condition: stable  Diet recommendation: heart healthy  Filed Weights   10/17/23 2359  Weight: 86.6 kg    History of present illness:   : [name] is [name] 78 y.o. female with medical history significant for primary insomnia, generalized anxiety, major depressive disorder, hypertension, hyperlipidemia, GERD, who presents to the ER from home due to altered mental status.  The patient took delta 8 THC Gummies with possible excessive doses last night.   While in the ER, the patient had Riyansh Gerstner witnessed seizure-like activity, tonic-clonic movements, lasting about Brandilyn Nanninga minute.  Associated with tongue biting and urinary incontinence.  She received 2 doses of IV Ativan .  Reportedly also had some agitation for which she received 20 mg of IM Geodon .  No reported prior history of seizures.  Noncontrast brain MRI and EEG ordered and are pending.  EDP discussed the case with neurology.  Recommendation for admission, close neuro monitoring, and observe the patient.  Admitted by [facility], [name] service.   MRI was without acute abnormality.  EEG without seizures or epileptiform discharges.    She's improved.  Stable for discharge with outpatient neurology follow up.    See below for additional details.   [name] Course:  Assessment and Plan:  Seizure-like activity/new onset seizure likely secondary to [name] use in the setting of insomnia and sleep deprivation MRI without acute abnormality EEG without seizures or epileptiform discharges Neurology  recommending avoid THC use, holding off on AED given this was Marcellus Pulliam first/provoked seizure.  Follow with neurology in 8-12 weeks.   Per [place]  DMV statutes, patients with seizures are not allowed to drive until  they have been seizure-free for six months. Use caution when using heavy equipment or power tools. Avoid working on ladders or at heights. Take showers instead of baths. Ensure the water temperature is not too high on the home water heater. Do not go swimming alone. When caring for infants or small children, sit down when holding, feeding, or changing them to minimize risk of injury to the child in the event you have [name] seizure. Also, Maintain good sleep hygiene. Avoid alcohol.   Lactic acidosis secondary to seizure activity Anion Gap Metabolic Acidosis Noted - acidosis improved at the time of discharge  AKI Improved at the time of discharge Negative orthostatics  Leukocytosis Mild, likely reactive in setting of seizure Afebrile  Mildly Elevated Troponin Flat, likely demand in setting of above  Acute metabolic encephalopathy secondary to [place] [name] use Improved at the time of discharge   Chronic anxiety/depression Resume home regimen when stable   Chronic insomnia On Ambien  for years Advised THC cessation.   T2DM Last A1c 6.3 (05/2023) Follow outpatient   Obesity Body mass index is 34.93 kg/m.     Procedures: EEG  IMPRESSION: This study is within normal limits. No seizures or epileptiform discharges were seen throughout the recording.   [name] normal interictal EEG does not exclude the diagnosis of epilepsy. Consultations: neurology  Discharge Exam: Vitals:   10/18/23 1430 10/18/23 1540  BP: (!) 151/65 (!) 154/94  Pulse: 94 100  Resp:  20  Temp: 98.1 F (36.7 C) 98.7 F (37.1 C)  SpO2: 95% 97%   No new complaints Discussed discharge with patient/husband   General: No acute distress. Cardiovascular: Heart sounds show Kenlea Woodell regular rate, and rhythm.  Lungs:  Clear to auscultation bilaterally Abdomen: Soft, nontender, nondistended Neurological: Alert and oriented 3. Moves all extremities 4 with equal strength. Cranial nerves II through XII intact.  FNF intact.  Some issues understanding H2S. Skin: Warm and dry. No rashes or lesions. Extremities: No clubbing or cyanosis. No edema.   Discharge Instructions   Discharge Instructions     Ambulatory referral to Neurology   Complete by: As directed    An appointment is requested in approximately: 8 weeks   Call MD for:  difficulty breathing, headache or visual disturbances   Complete by: As directed    Call MD for:  extreme fatigue   Complete by: As directed    Call MD for:  hives   Complete by: As directed    Call MD for:  persistant dizziness or light-headedness   Complete by: As directed    Call MD for:  persistant nausea and vomiting   Complete by: As directed    Call MD for:  redness, tenderness, or signs of infection (pain, swelling, redness, odor or green/yellow discharge around incision site)   Complete by: As directed    Call MD for:  severe uncontrolled pain   Complete by: As directed    Call MD for:  temperature >100.4   Complete by: As directed    Diet - low sodium heart healthy   Complete by: As directed    Discharge instructions   Complete by: As directed    You were seen for [name] seizure.  We think this was provoked with your use of the THC gummies.  Please avoid this going forward.  Per [place]  DMV statutes, patients with seizures are not allowed to drive until  they have been seizure-free for six months. Use caution when using heavy equipment or power tools. Avoid working on ladders or at heights. Take showers instead of baths. Ensure the water temperature is not too high on the home water heater. Do not go swimming alone. When caring for infants or small children, sit down when holding, feeding, or changing them to minimize risk of injury to the child in the event  you have [name] seizure. Also, Maintain good sleep hygiene. Avoid alcohol.  We'll have you follow up with neurology as an outpatient.  Return for new, recurrent, or worsening symptoms.  Please ask your PCP to request records from this hospitalization so they know what was done and what the next steps will be.   Increase activity slowly   Complete by: As directed       Allergies as of 10/18/2023       Reactions   Penicillins Hives   Tolerates Cephalosporins   Prednisone  Other (See Comments)   Hyperactivity, sleep disturbance, confusion   Sulfa Antibiotics Hives           Medication List     TAKE these medications    amLODipine -benazepril  5-20 MG capsule Commonly known as: LOTREL  Take 1 capsule by mouth daily.   atorvastatin  20 MG tablet Commonly known as: LIPITOR Take 1 tablet (20 mg total) by mouth daily.   CALCIUM  PO Take 1,200 mg by mouth daily.   fluticasone  50 MCG/ACT nasal spray Commonly known as: FLONASE  Place 1 spray into both nostrils daily as needed  for allergies or rhinitis.   lidocaine  2 % solution Commonly known as: XYLOCAINE  Use as directed 10 mLs in the mouth or throat every 6 (six) hours as needed for mouth pain.   MULTIVITAMIN ADULT PO Take 1 tablet by mouth daily.   omeprazole  40 MG capsule Commonly known as: PRILOSEC Take 1 capsule (40 mg total) by mouth daily.   QUEtiapine  25 MG tablet Commonly known as: SEROquel  Take 0.5-1 tablets (12.5-25 mg total) by mouth at bedtime. Start at 1/2 tablet and increase to full tablet if needed/tolerated.   raloxifene  60 MG tablet Commonly known as: EVISTA  Take 1 tablet (60 mg total) by mouth daily.   triamterene -hydrochlorothiazide  37.5-25 MG capsule Commonly known as: DYAZIDE  Take 1 capsule by mouth daily.   venlafaxine  XR 150 MG 24 hr capsule Commonly known as: EFFEXOR -XR Take 1 capsule (150 mg total) by mouth daily with breakfast.   zolpidem  5 MG tablet Commonly known as: AMBIEN  Take 1.5  tablets (7.5 mg total) by mouth at bedtime as needed for sleep.       Allergies  Allergen Reactions   Penicillins Hives    Tolerates Cephalosporins    Prednisone  Other (See Comments)    Hyperactivity, sleep disturbance, confusion   Sulfa Antibiotics Hives         Follow-up Information     [facility], [facility] Follow up.   Specialty: Home Health Services Why: [name] will contact you for the first home visit Contact information: [address] [place] [address] [phone]                  The results of significant diagnostics from this hospitalization (including imaging, microbiology, ancillary and laboratory) are listed below for reference.    Significant Diagnostic Studies: EEG adult Result Date: 10/18/2023 Shelton Arlin KIDD, MD     10/18/2023  8:45 AM Patient Name: Raneshia Derick MRN: [id number] Epilepsy Attending: Arlin KIDD Shelton Referring Physician/Provider: Voncile Isles, MD Date: 10/18/2023 Duration: 23.47 mins Patient history: 78 y.o. female past history of anxiety, depression, hypertension, hyperlipidemia, insomnia who presented for evaluation of altered mental status after ingestion of THC/delta 8 gummy yesterday, then had Colum Colt witnessed tonic-clonic seizure lasting about 1 minute in the ED. EEG to evaluate for seizure Level of alertness: Awake AEDs during EEG study: None Technical aspects: This EEG study was done with scalp electrodes positioned according to the 10-20 International system of electrode placement. Electrical activity was reviewed with band pass filter of 1-70Hz , sensitivity of 7 uV/mm, display speed of 56mm/sec with Sharmel Ballantine 60Hz  notched filter applied as appropriate. EEG data were recorded continuously and digitally stored.  Video monitoring was available and reviewed as appropriate. Description: The posterior dominant rhythm consists of 8-9 Hz activity of moderate voltage (25-35 uV) seen predominantly in posterior head regions, symmetric  and reactive to eye opening and eye closing. Hyperventilation and photic stimulation were not performed.   IMPRESSION: This study is within normal limits. No seizures or epileptiform discharges were seen throughout the recording. Jahmar Mckelvy normal interictal EEG does not exclude the diagnosis of epilepsy. Arlin KIDD Shelton   MR BRAIN WO CONTRAST Result Date: 10/17/2023 EXAM: MRI BRAIN WITHOUT CONTRAST 10/17/2023 10:51:19 PM TECHNIQUE: Multiplanar multisequence MRI of the head/brain was performed without the administration of intravenous contrast. COMPARISON: None available. CLINICAL HISTORY: Delirium. FINDINGS: BRAIN AND VENTRICLES: No acute infarct. No intracranial hemorrhage. No mass. No midline shift. No hydrocephalus. The sella is unremarkable. Normal flow voids. ORBITS: No acute abnormality. SINUSES  AND MASTOIDS: No acute abnormality. BONES AND SOFT TISSUES: Normal marrow signal. No acute soft tissue abnormality. IMPRESSION: 1. No acute intracranial abnormality. Electronically signed by: Franky Stanford MD 10/17/2023 11:25 PM EDT RP Workstation: [id number]   DG Chest Portable 1 View Result Date: 10/17/2023 CLINICAL DATA:  Altered mental status, dizziness, chronic insomnia. EXAM: PORTABLE CHEST 1 VIEW COMPARISON:  Portable chest 12/25/2022. FINDINGS: The heart is slightly enlarged. No vascular congestion is seen. The mediastinum is normally outlined. There is mild calcification of the transverse aorta. There is Ayris Carano low inspiration on exam. The lungs appear generally clear but with limited view of the bases. Moderate thoracic spondylosis with osteopenia. Multiple overlying telemetry leads. IMPRESSION: 1. Low inspiration with no evidence of acute chest disease. Limited view of the bases. 2. Aortic atherosclerosis. Electronically Signed   By: Francis Quam M.D.   On: 10/17/2023 20:09   CT Head Wo Contrast Result Date: 10/17/2023 CLINICAL DATA:  Delirium and altered mental status. EXAM: CT HEAD WITHOUT CONTRAST TECHNIQUE:  Contiguous axial images were obtained from the base of the skull through the vertex without intravenous contrast. RADIATION DOSE REDUCTION: This exam was performed according to the departmental dose-optimization program which includes automated exposure control, adjustment of the mA and/or kV according to patient size and/or use of iterative reconstruction technique. COMPARISON:  None. FINDINGS: Brain: There is mild cerebral and mild-to-moderate cerebellar atrophy, early features of small-vessel disease of the cerebral white matter. The ventricles are normal in size and position. Basal cisterns are clear. No cortical based acute infarct, hemorrhage, mass or mass effect is seen. Vascular: There are calcific plaques in the carotid siphons. No hyperdense central vessel is seen. Skull: Negative for fractures or focal lesions. Hyperostosis frontalis interna. Sinuses/Orbits: No acute finding. Old lens replacements. Otherwise negative orbits. Clear sinuses and mastoids. S shaped nasal septum. Other: None. IMPRESSION: 1. No acute intracranial CT findings. 2. Atrophy and early small-vessel disease. 3. Carotid atherosclerosis. Electronically Signed   By: Francis Quam M.D.   On: 10/17/2023 20:07   DG Bone Density Result Date: 10/04/2023 EXAM: DUAL X-RAY ABSORPTIOMETRY (DXA) FOR BONE MINERAL DENSITY 10/03/2023 11:45 am CLINICAL DATA:  78 year old Female Postmenopausal. Patient is or has been on bone building therapies. TECHNIQUE: An axial (e.g., hips, spine) and/or appendicular (e.g., radius) exam was performed, as appropriate, using GE Secretary/administrator at [employer]. Images are obtained for bone mineral density measurement and are not obtained for diagnostic purposes. [id number] Exclusions: None. COMPARISON:  None. New baseline. FINDINGS: Scan quality: Good. LUMBAR SPINE (L1-L4): BMD (in g/cm2): 1.024 T-score: -1.3 Z-score: 0.5 LEFT FEMORAL NECK: BMD (in g/cm2): 0.718 T-score: -2.3 Z-score: -0.2  LEFT TOTAL HIP: BMD (in g/cm2): 0.818 T-score: -1.5 Z-score: 0.4 RIGHT FEMORAL NECK: BMD (in g/cm2): 0.653 T-score: -2.8 Z-score: -0.7 RIGHT TOTAL HIP: BMD (in g/cm2): 0.799 T-score: -1.7 Z-score: 0.3 FRAX 10-YEAR PROBABILITY OF FRACTURE: FRAX not reported as the lowest BMD is not in the osteopenia range. IMPRESSION: Osteoporosis based on BMD. Fracture risk is unknown due to history of bone building therapy. RECOMMENDATIONS: 1. All patients should optimize calcium  and vitamin D  intake. 2. Consider FDA-approved medical therapies in postmenopausal women and men aged 23 years and older, based on the following: - Porsha Skilton hip or vertebral (clinical or morphometric) fracture - T-score less than or equal to -2.5 and secondary causes have been excluded. - Low bone mass (T-score between -1.0 and -2.5) and Amier Hoyt 10-year probability of Tatumn Corbridge hip fracture greater than or equal to  3% or Trell Secrist 10-year probability of Mozel Burdett major osteoporosis-related fracture greater than or equal to 20% based on the [place] -adapted WHO algorithm. - Clinician judgment and/or patient preferences may indicate treatment for people with 10-year fracture probabilities above or below these levels 3. Patients with diagnosis of osteoporosis or at high risk for fracture should have regular bone mineral density tests. For patients eligible for Medicare, routine testing is allowed once every 2 years. The testing frequency can be increased to one year for patients who have rapidly progressing disease, those who are receiving or discontinuing medical therapy to restore bone mass, or have additional risk factors. Electronically Signed   By: Reyes Phi M.D.   On: 10/04/2023 13:36    Microbiology: No results found for this or any previous visit (from the past 240 hours).   Labs: Basic Metabolic Panel: Recent Labs  Lab 10/17/23 1801 10/17/23 1838 10/17/23 2300 10/18/23 0439  NA 135 134*  --  138  K 3.5 3.5  --  3.7  CL 98  --   --  103  CO2 14*  --   --  19*  GLUCOSE 191*   --   --  112*  BUN 18  --   --  19  CREATININE 1.39*  --  1.01* 1.07*  CALCIUM  9.2  --   --  8.9  MG 2.1  --   --  2.1  PHOS  --   --   --  3.2   Liver Function Tests: Recent Labs  Lab 10/17/23 1801  AST 52*  ALT 32  ALKPHOS 121  BILITOT 0.7  PROT 7.4  ALBUMIN 3.6   Recent Labs  Lab 10/17/23 1801  LIPASE 30   No results for input(s): AMMONIA in the last 168 hours. CBC: Recent Labs  Lab 10/17/23 1801 10/17/23 1838 10/17/23 2300 10/18/23 0439  WBC 11.3*  --  15.9* 12.8*  NEUTROABS 8.7*  --   --   --   HGB 12.5 13.3 11.4* 11.7*  HCT 39.1 39.0 35.4* 36.1  MCV 84.8  --  83.9 82.8  PLT 429*  --  391 390   Cardiac Enzymes: Recent Labs  Lab 10/17/23 1801  CKTOTAL 67   BNP: BNP (last 3 results) Recent Labs    12/25/22 1347  BNP 76.9    ProBNP (last 3 results) No results for input(s): PROBNP in the last 8760 hours.  CBG: Recent Labs  Lab 10/17/23 1742 10/18/23 0451 10/18/23 0805 10/18/23 1257  GLUCAP 103* 126* 136* 111*       Signed:  Meliton Monte MD.  [facility] 10/18/2023, 5:33 PM

## 2023-10-18 NOTE — Evaluation (Signed)
 Physical Therapy Evaluation Patient Details Name: [name] MRN: [id number] DOB: Apr 29, 1945 Today's Date: 10/18/2023  History of Present Illness  Patient is a 78 y/o female seen in ED 10/17/23 with AMS, seizure like activity.  PMH positive for HTN, GERD, MDD, GAD, insomina.  Clinical Impression  Patient presents with decreased mobility due to decreased activity tolerance, decreased balance and generalized weakness.  Previously independent at home though with history of chronic insomnia.  She needed supervision to [facility] today for mobility in the room without RW and for hallway ambulation with RW.  She demonstrated some dyspnea with HR elevation to 144 with ambulation.  She is stable for home with spouse support and has RW at home.  Discussed follow up outpatient PT for activity and balance progression.  Will sign off as likely home today.         If plan is discharge home, recommend the following: Direct supervision/assist for financial management;Direct supervision/assist for medications management;Help with stairs or ramp for entrance   Can travel by private vehicle        Equipment Recommendations None recommended by PT  Recommendations for Other Services       Functional Status Assessment Patient has had a recent decline in their functional status and demonstrates the ability to make significant improvements in function in a reasonable and predictable amount of time.     Precautions / Restrictions Precautions Precautions: Fall Precaution/Restrictions Comments: watch HR      Mobility  Bed Mobility Overal bed mobility: Modified Independent             General bed mobility comments: increased time and effort with foot of stretcher elevated and does not lower    Transfers Overall transfer level: Needs assistance Equipment used: Rolling walker (2 wheels) Transfers: Sit to/from Stand Sit to Stand: Supervision           General transfer comment: increased time  and assist for lines    Ambulation/Gait Ambulation/Gait assistance: Supervision, Contact guard assist Gait Distance (Feet): 45 Feet (x 2) Assistive device: Rolling walker (2 wheels), None Gait Pattern/deviations: Step-through pattern, Decreased stride length       General Gait Details: walked in bathroom no device though reports feels more stable with RW; HR up to 144  Stairs            Wheelchair Mobility     Tilt Bed    Modified Rankin (Stroke Patients Only)       Balance Overall balance assessment: Mild deficits observed, not formally tested                                           Pertinent Vitals/Pain Pain Assessment Pain Assessment: Faces Faces Pain Scale: Hurts little more Pain Location: back Pain Descriptors / Indicators: Sore Pain Intervention(s): Monitored during session, Repositioned    Home Living Family/patient expects to be discharged to:: Private residence Living Arrangements: Spouse/significant other Available Help at Discharge: Family;Available 24 hours/day Type of Home: House Home Access: Stairs to enter Entrance Stairs-Rails: Right Entrance Stairs-Number of Steps: 2   Home Layout: One level Home Equipment: Agricultural consultant (2 wheels);Cane - single point;Grab bars - tub/shower      Prior Function Prior Level of Function : Independent/Modified Independent                     Extremity/Trunk Assessment  Upper Extremity Assessment Upper Extremity Assessment: Generalized weakness (tremors noted)    Lower Extremity Assessment Lower Extremity Assessment: Generalized weakness    Cervical / Trunk Assessment Cervical / Trunk Assessment: Kyphotic  Communication   Communication Communication: Impaired Factors Affecting Communication: Hearing impaired    Cognition Arousal: Alert Behavior During Therapy: Anxious   PT - Cognitive impairments: No apparent impairments                       PT -  Cognition Comments: not formally tested, difficulty mainly due to [facility] and not wearing hearing aides today Following commands: Impaired Following commands impaired: Follows multi-step commands with increased time     Cueing Cueing Techniques: Verbal cues, Visual cues     General Comments General comments (skin integrity, edema, etc.): spouse in the room, discussed follow up outpatient PT, HR 120's at rest, 144 with ambulation, tremulous and mild dyspnea with SpO2 94% on RA    Exercises     Assessment/Plan    PT Assessment All further PT needs can be met in the next venue of care  PT Problem List Decreased activity tolerance;Decreased mobility;Decreased balance;Cardiopulmonary status limiting activity;Decreased knowledge of use of DME       PT Treatment Interventions      PT Goals (Current goals can be found in the Care Plan section)  Acute Rehab PT Goals Patient Stated Goal: return home, sleep PT Goal Formulation: All assessment and education complete, DC therapy    Frequency       Co-evaluation               AM-PAC PT 6 Clicks Mobility  Outcome Measure Help needed turning from your back to your side while in a flat bed without using bedrails?: None Help needed moving from lying on your back to sitting on the side of a flat bed without using bedrails?: None Help needed moving to and from a bed to a chair (including a wheelchair)?: None Help needed standing up from a chair using your arms (e.g., wheelchair or bedside chair)?: None Help needed to walk in hospital room?: A Little Help needed climbing 3-5 steps with a railing? : A Little 6 Click Score: 22    End of Session Equipment Utilized During Treatment: Gait belt Activity Tolerance: Patient limited by fatigue Patient left: in bed;with call bell/phone within reach;with family/visitor present   PT Visit Diagnosis: Muscle weakness (generalized) (M62.81);Difficulty in walking, not elsewhere classified (R26.2)     Time: 9072-9050 PT Time Calculation (min) (ACUTE ONLY): 22 min   Charges:   PT Evaluation $PT Eval Low Complexity: 1 Low   PT General Charges $$ ACUTE PT VISIT: 1 Visit         Micheline Portal, PT Acute Rehabilitation Services Office:[phone] 10/18/2023   Montie Portal 10/18/2023, 10:39 AM

## 2023-10-18 NOTE — Procedures (Signed)
 Patient Name: [name]  MRN: [id number]  Epilepsy Attending: Arlin MALVA Krebs  Referring Physician/Provider: Voncile Isles, MD  Date: 10/18/2023 Duration: 23.47 mins  Patient history: 78 y.o. female past history of anxiety, depression, hypertension, hyperlipidemia, insomnia who presented for evaluation of altered mental status after ingestion of THC/delta 8 gummy yesterday, then had a witnessed tonic-clonic seizure lasting about 1 minute in the ED. EEG to evaluate for seizure  Level of alertness: Awake  AEDs during EEG study: None  Technical aspects: This EEG study was done with scalp electrodes positioned according to the 10-20 International system of electrode placement. Electrical activity was reviewed with band pass filter of 1-70Hz , sensitivity of 7 uV/mm, display speed of 45mm/sec with a 60Hz  notched filter applied as appropriate. EEG data were recorded continuously and digitally stored.  Video monitoring was available and reviewed as appropriate.  Description: The posterior dominant rhythm consists of 8-9 Hz activity of moderate voltage (25-35 uV) seen predominantly in posterior head regions, symmetric and reactive to eye opening and eye closing. Hyperventilation and photic stimulation were not performed.     IMPRESSION: This study is within normal limits. No seizures or epileptiform discharges were seen throughout the recording.  A normal interictal EEG does not exclude the diagnosis of epilepsy.   Denise Bramblett O Blondine Hottel

## 2023-10-18 NOTE — TOC Transition Note (Signed)
 Transition of Care [facility]) - Discharge Note   Patient Details  Name: [name] MRN: [id number] Date of Birth: 23-May-1945  Transition of [facility]) CM/SW Contact:  Andrez JULIANNA George, RN Phone Number: 10/18/2023, 4:30 PM   Clinical Narrative:     Pt is discharging home with home health services through [facility]. Pt prefers therapy come to her home over outpt. [name] will contact her for the first home visit.  DME at home; walker/ cane/ [facility] Pt states her spouse can provide needed transportation. She manages her own medications but will have her spouse watch over her for first several days at home.  She has transportation home.  Final next level of care: Home w Home Health Services Barriers to Discharge: No Barriers Identified   Patient Goals and CMS Choice   CMS Medicare.gov Compare Post Acute Care list provided to:: Patient Choice offered to / list presented to : Patient      Discharge Placement                       Discharge Plan and Services Additional resources added to the After Visit Summary for                            [facility] Arranged: PT, OT [facility] Agency: [facility] Date [facility] Agency Contacted: 10/18/23   Representative spoke with at [facility] Agency: [name]  Social Drivers of Health (SDOH) Interventions SDOH Screenings   Food Insecurity: No Food Insecurity (10/18/2023)  Housing: Low Risk  (10/18/2023)  Transportation Needs: No Transportation Needs (10/18/2023)  Utilities: Not At Risk (10/18/2023)  Alcohol Screen: Low Risk  (12/11/2022)  Depression (PHQ2-9): Low Risk  (05/28/2023)  Recent Concern: Depression (PHQ2-9) - Medium Risk (05/16/2023)  Financial Resource Strain: Low Risk  (08/02/2023)  Physical Activity: Insufficiently Active (08/02/2023)  Social Connections: Moderately Integrated (10/18/2023)  Recent Concern: Social Connections - Moderately Isolated (08/02/2023)  Stress: No Stress Concern Present (08/02/2023)  Recent Concern: Stress - Stress Concern  Present (05/12/2023)  Tobacco Use: Low Risk  (10/17/2023)     Readmission Risk Interventions    02/07/2022    8:48 AM  Readmission Risk Prevention Plan  Post Dischage Appt Complete  Medication Screening Complete  Transportation Screening Complete

## 2023-10-18 NOTE — Plan of Care (Signed)
 Problem: Education: Goal: Ability to describe self-care measures that may prevent or decrease complications (Diabetes Survival Skills Education) will improve 10/18/2023 1755 by Bernardine Nelwyn DASEN, RN Outcome: Completed/Met 10/18/2023 1755 by Bernardine Nelwyn DASEN, RN Outcome: Adequate for Discharge 10/18/2023 1734 by Bernardine Nelwyn DASEN, RN Outcome: Progressing Goal: Individualized Educational Video(s) 10/18/2023 1755 by Bernardine Nelwyn DASEN, RN Outcome: Completed/Met 10/18/2023 1755 by Bernardine Nelwyn DASEN, RN Outcome: Adequate for Discharge 10/18/2023 1734 by Bernardine Nelwyn DASEN, RN Outcome: Progressing   Problem: Coping: Goal: Ability to adjust to condition or change in health will improve 10/18/2023 1755 by Bernardine Nelwyn DASEN, RN Outcome: Completed/Met 10/18/2023 1755 by Bernardine Nelwyn DASEN, RN Outcome: Adequate for Discharge 10/18/2023 1734 by Bernardine Nelwyn DASEN, RN Outcome: Progressing   Problem: Health Behavior/Discharge Planning: Goal: Ability to identify and utilize available resources and services will improve 10/18/2023 1755 by Bernardine Nelwyn DASEN, RN Outcome: Completed/Met 10/18/2023 1755 by Bernardine Nelwyn DASEN, RN Outcome: Adequate for Discharge 10/18/2023 1734 by Bernardine Nelwyn DASEN, RN Outcome: Progressing Goal: Ability to manage health-related needs will improve 10/18/2023 1755 by Bernardine Nelwyn DASEN, RN Outcome: Completed/Met 10/18/2023 1755 by Bernardine Nelwyn DASEN, RN Outcome: Adequate for Discharge 10/18/2023 1734 by Bernardine Nelwyn DASEN, RN Outcome: Progressing   Problem: Metabolic: Goal: Ability to maintain appropriate glucose levels will improve 10/18/2023 1755 by Bernardine Nelwyn DASEN, RN Outcome: Completed/Met 10/18/2023 1755 by Bernardine Nelwyn DASEN, RN Outcome: Adequate for Discharge 10/18/2023 1734 by Bernardine Nelwyn DASEN, RN Outcome: Progressing   Problem: Nutritional: Goal: Maintenance of adequate nutrition will improve 10/18/2023 1755 by Bernardine Nelwyn DASEN, RN Outcome: Completed/Met 10/18/2023 1755 by Bernardine Nelwyn DASEN, RN Outcome: Adequate for Discharge 10/18/2023 1734 by Bernardine Nelwyn DASEN, RN Outcome: Progressing Goal: Progress toward achieving an optimal weight will improve 10/18/2023 1755 by Bernardine Nelwyn DASEN, RN Outcome: Completed/Met 10/18/2023 1755 by Bernardine Nelwyn DASEN, RN Outcome: Adequate for Discharge 10/18/2023 1734 by Bernardine Nelwyn DASEN, RN Outcome: Progressing   Problem: Education: Goal: Knowledge of General Education information will improve Description: Including pain rating scale, medication(s)/side effects and non-pharmacologic comfort measures 10/18/2023 1755 by Bernardine Nelwyn DASEN, RN Outcome: Completed/Met 10/18/2023 1755 by Bernardine Nelwyn DASEN, RN Outcome: Adequate for Discharge 10/18/2023 1734 by Bernardine Nelwyn DASEN, RN Outcome: Progressing   Problem: Health Behavior/Discharge Planning: Goal: Ability to manage health-related needs will improve 10/18/2023 1755 by Bernardine Nelwyn DASEN, RN Outcome: Completed/Met 10/18/2023 1755 by Bernardine Nelwyn DASEN, RN Outcome: Adequate for Discharge 10/18/2023 1734 by Bernardine Nelwyn DASEN, RN Outcome: Progressing   Problem: Clinical Measurements: Goal: Ability to maintain clinical measurements within normal limits will improve 10/18/2023 1755 by Bernardine Nelwyn DASEN, RN Outcome: Completed/Met 10/18/2023 1755 by Bernardine Nelwyn DASEN, RN Outcome: Adequate for Discharge 10/18/2023 1734 by Bernardine Nelwyn DASEN, RN Outcome: Progressing Goal: Will remain free from infection 10/18/2023 1755 by Bernardine Nelwyn DASEN, RN Outcome: Completed/Met 10/18/2023 1755 by Bernardine Nelwyn DASEN, RN Outcome: Adequate for Discharge 10/18/2023 1734 by Bernardine Nelwyn DASEN, RN Outcome: Progressing Goal: Diagnostic test results will improve 10/18/2023 1755 by Bernardine Nelwyn DASEN, RN Outcome: Completed/Met 10/18/2023 1755 by Bernardine Nelwyn DASEN, RN Outcome: Adequate for Discharge 10/18/2023 1734 by Bernardine Nelwyn DASEN, RN Outcome: Progressing Goal: Respiratory complications will improve 10/18/2023 1755 by Bernardine Nelwyn DASEN, RN Outcome: Completed/Met 10/18/2023 1755 by Bernardine Nelwyn DASEN, RN Outcome: Adequate for Discharge 10/18/2023 1734 by Bernardine Nelwyn DASEN, RN Outcome: Progressing Goal: Cardiovascular complication will be avoided 10/18/2023 1755 by Bernardine Nelwyn DASEN, RN Outcome: Completed/Met 10/18/2023 1755 by Bernardine Nelwyn DASEN, RN Outcome:  Adequate for Discharge 10/18/2023 1734 by Bernardine Nelwyn DASEN, RN Outcome: Progressing   Problem: Activity: Goal: Risk for activity intolerance will decrease 10/18/2023 1755 by Bernardine Nelwyn DASEN, RN Outcome: Completed/Met 10/18/2023 1755 by Bernardine Nelwyn DASEN, RN Outcome: Adequate for Discharge 10/18/2023 1734 by Bernardine Nelwyn DASEN, RN Outcome: Progressing   Problem: Nutrition: Goal: Adequate nutrition will be maintained 10/18/2023 1755 by Bernardine Nelwyn DASEN, RN Outcome: Completed/Met 10/18/2023 1755 by Bernardine Nelwyn DASEN, RN Outcome: Adequate for Discharge 10/18/2023 1734 by Bernardine Nelwyn DASEN, RN Outcome: Progressing   Problem: Coping: Goal: Level of anxiety will decrease 10/18/2023 1755 by Bernardine Nelwyn DASEN, RN Outcome: Completed/Met 10/18/2023 1755 by Bernardine Nelwyn DASEN, RN Outcome: Adequate for Discharge 10/18/2023 1734 by Bernardine Nelwyn DASEN, RN Outcome: Progressing   Problem: Elimination: Goal: Will not experience complications related to bowel motility 10/18/2023 1755 by Bernardine Nelwyn DASEN, RN Outcome: Completed/Met 10/18/2023 1755 by Bernardine Nelwyn DASEN, RN Outcome: Adequate for Discharge 10/18/2023 1734 by Bernardine Nelwyn DASEN, RN Outcome: Progressing Goal: Will not experience complications related to urinary retention 10/18/2023 1755 by Bernardine Nelwyn DASEN, RN Outcome: Completed/Met 10/18/2023 1755 by Bernardine Nelwyn DASEN, RN Outcome: Adequate for Discharge 10/18/2023 1734 by Bernardine Nelwyn DASEN, RN Outcome: Progressing   Problem: Pain Managment: Goal: General experience of comfort will improve and/or be controlled 10/18/2023 1755 by Bernardine Nelwyn DASEN, RN Outcome:  Completed/Met 10/18/2023 1755 by Bernardine Nelwyn DASEN, RN Outcome: Adequate for Discharge 10/18/2023 1734 by Bernardine Nelwyn DASEN, RN Outcome: Progressing

## 2023-10-18 NOTE — Plan of Care (Signed)

## 2023-10-18 NOTE — ED Notes (Signed)
 CCMD notified of pt moving to room 37.

## 2023-10-18 NOTE — Treatment Plan (Signed)
 Possible discharge today (9/4) after EEG results, therapy eval, neurology follow up.

## 2023-10-22 ENCOUNTER — Other Ambulatory Visit (HOSPITAL_COMMUNITY): Payer: Self-pay | Admitting: Psychiatry

## 2023-10-22 ENCOUNTER — Other Ambulatory Visit: Payer: Self-pay

## 2023-10-22 ENCOUNTER — Inpatient Hospital Stay (HOSPITAL_COMMUNITY)
Admission: EM | Admit: 2023-10-22 | Discharge: 2023-10-26 | DRG: 100 | Disposition: A | Discharge status: Home Health | Attending: Family Medicine | Admitting: Family Medicine

## 2023-10-22 ENCOUNTER — Encounter (HOSPITAL_COMMUNITY): Payer: Self-pay

## 2023-10-22 ENCOUNTER — Other Ambulatory Visit (HOSPITAL_COMMUNITY): Payer: Self-pay

## 2023-10-22 DIAGNOSIS — F5101 Primary insomnia: Secondary | ICD-10-CM

## 2023-10-22 DIAGNOSIS — Z96651 Presence of right artificial knee joint: Secondary | ICD-10-CM | POA: Diagnosis not present

## 2023-10-22 DIAGNOSIS — G928 Other toxic encephalopathy: Secondary | ICD-10-CM | POA: Diagnosis present

## 2023-10-22 DIAGNOSIS — I129 Hypertensive chronic kidney disease with stage 1 through stage 4 chronic kidney disease, or unspecified chronic kidney disease: Secondary | ICD-10-CM | POA: Diagnosis present

## 2023-10-22 DIAGNOSIS — F3340 Major depressive disorder, recurrent, in remission, unspecified: Secondary | ICD-10-CM

## 2023-10-22 DIAGNOSIS — Z882 Allergy status to sulfonamides status: Secondary | ICD-10-CM | POA: Diagnosis not present

## 2023-10-22 DIAGNOSIS — E1122 Type 2 diabetes mellitus with diabetic chronic kidney disease: Secondary | ICD-10-CM | POA: Diagnosis present

## 2023-10-22 DIAGNOSIS — Z8249 Family history of ischemic heart disease and other diseases of the circulatory system: Secondary | ICD-10-CM | POA: Diagnosis not present

## 2023-10-22 DIAGNOSIS — K219 Gastro-esophageal reflux disease without esophagitis: Secondary | ICD-10-CM | POA: Diagnosis present

## 2023-10-22 DIAGNOSIS — Z822 Family history of deafness and hearing loss: Secondary | ICD-10-CM

## 2023-10-22 DIAGNOSIS — E876 Hypokalemia: Secondary | ICD-10-CM | POA: Diagnosis present

## 2023-10-22 DIAGNOSIS — R197 Diarrhea, unspecified: Secondary | ICD-10-CM | POA: Diagnosis not present

## 2023-10-22 DIAGNOSIS — R410 Disorientation, unspecified: Secondary | ICD-10-CM | POA: Diagnosis not present

## 2023-10-22 DIAGNOSIS — F411 Generalized anxiety disorder: Secondary | ICD-10-CM | POA: Diagnosis present

## 2023-10-22 DIAGNOSIS — G4700 Insomnia, unspecified: Secondary | ICD-10-CM | POA: Diagnosis not present

## 2023-10-22 DIAGNOSIS — R4182 Altered mental status, unspecified: Secondary | ICD-10-CM | POA: Diagnosis not present

## 2023-10-22 DIAGNOSIS — N1831 Chronic kidney disease, stage 3a: Secondary | ICD-10-CM | POA: Diagnosis present

## 2023-10-22 DIAGNOSIS — N179 Acute kidney failure, unspecified: Secondary | ICD-10-CM | POA: Diagnosis present

## 2023-10-22 DIAGNOSIS — Z818 Family history of other mental and behavioral disorders: Secondary | ICD-10-CM

## 2023-10-22 DIAGNOSIS — Z880 Allergy status to penicillin: Secondary | ICD-10-CM

## 2023-10-22 DIAGNOSIS — I10 Essential (primary) hypertension: Secondary | ICD-10-CM | POA: Diagnosis present

## 2023-10-22 DIAGNOSIS — G9341 Metabolic encephalopathy: Secondary | ICD-10-CM | POA: Insufficient documentation

## 2023-10-22 DIAGNOSIS — R451 Restlessness and agitation: Secondary | ICD-10-CM | POA: Diagnosis not present

## 2023-10-22 DIAGNOSIS — T40715A Adverse effect of cannabis, initial encounter: Secondary | ICD-10-CM | POA: Diagnosis not present

## 2023-10-22 DIAGNOSIS — E66811 Obesity, class 1: Secondary | ICD-10-CM | POA: Diagnosis present

## 2023-10-22 DIAGNOSIS — R569 Unspecified convulsions: Principal | ICD-10-CM | POA: Diagnosis present

## 2023-10-22 DIAGNOSIS — Z6833 Body mass index (BMI) 33.0-33.9, adult: Secondary | ICD-10-CM

## 2023-10-22 DIAGNOSIS — Z825 Family history of asthma and other chronic lower respiratory diseases: Secondary | ICD-10-CM

## 2023-10-22 DIAGNOSIS — Z83438 Family history of other disorder of lipoprotein metabolism and other lipidemia: Secondary | ICD-10-CM

## 2023-10-22 DIAGNOSIS — Z888 Allergy status to other drugs, medicaments and biological substances status: Secondary | ICD-10-CM

## 2023-10-22 DIAGNOSIS — M8580 Other specified disorders of bone density and structure, unspecified site: Secondary | ICD-10-CM | POA: Diagnosis present

## 2023-10-22 DIAGNOSIS — Z9049 Acquired absence of other specified parts of digestive tract: Secondary | ICD-10-CM

## 2023-10-22 DIAGNOSIS — E872 Acidosis, unspecified: Secondary | ICD-10-CM | POA: Diagnosis present

## 2023-10-22 DIAGNOSIS — E785 Hyperlipidemia, unspecified: Secondary | ICD-10-CM | POA: Diagnosis present

## 2023-10-22 DIAGNOSIS — Z79899 Other long term (current) drug therapy: Secondary | ICD-10-CM

## 2023-10-22 DIAGNOSIS — R7303 Prediabetes: Secondary | ICD-10-CM | POA: Diagnosis present

## 2023-10-22 DIAGNOSIS — N183 Chronic kidney disease, stage 3 unspecified: Secondary | ICD-10-CM | POA: Diagnosis present

## 2023-10-22 DIAGNOSIS — R000 Tachycardia, unspecified: Secondary | ICD-10-CM | POA: Diagnosis not present

## 2023-10-22 DIAGNOSIS — I6782 Cerebral ischemia: Secondary | ICD-10-CM | POA: Diagnosis not present

## 2023-10-22 DIAGNOSIS — G40909 Epilepsy, unspecified, not intractable, without status epilepticus: Secondary | ICD-10-CM | POA: Diagnosis not present

## 2023-10-22 DIAGNOSIS — G9340 Encephalopathy, unspecified: Secondary | ICD-10-CM | POA: Diagnosis not present

## 2023-10-22 DIAGNOSIS — G3184 Mild cognitive impairment, so stated: Secondary | ICD-10-CM | POA: Diagnosis not present

## 2023-10-22 DIAGNOSIS — G319 Degenerative disease of nervous system, unspecified: Secondary | ICD-10-CM | POA: Diagnosis not present

## 2023-10-22 DIAGNOSIS — Z8261 Family history of arthritis: Secondary | ICD-10-CM

## 2023-10-22 LAB — BASIC METABOLIC PANEL WITH GFR
Anion gap: 19 — ABNORMAL HIGH (ref 5–15)
BUN: 26 mg/dL — ABNORMAL HIGH (ref 8–23)
CO2: 19 mmol/L — ABNORMAL LOW (ref 22–32)
Calcium: 9.2 mg/dL (ref 8.9–10.3)
Chloride: 100 mmol/L (ref 98–111)
Creatinine, Ser: 1.51 mg/dL — ABNORMAL HIGH (ref 0.44–1.00)
GFR, Estimated: 35 mL/min — ABNORMAL LOW (ref >60)
Glucose, Bld: 160 mg/dL — ABNORMAL HIGH (ref 70–99)
Potassium: 3.1 mmol/L — ABNORMAL LOW (ref 3.5–5.1)
Sodium: 138 mmol/L (ref 135–145)

## 2023-10-22 LAB — CBC WITH DIFFERENTIAL/PLATELET
Abs Immature Granulocytes: 0.1 K/uL — ABNORMAL HIGH (ref 0.00–0.07)
Basophils Absolute: 0.1 K/uL (ref 0.0–0.1)
Basophils Relative: 0 %
Eosinophils Absolute: 0 K/uL (ref 0.0–0.5)
Eosinophils Relative: 0 %
HCT: 37.3 % (ref 36.0–46.0)
Hemoglobin: 11.9 g/dL — ABNORMAL LOW (ref 12.0–15.0)
Immature Granulocytes: 1 %
Lymphocytes Relative: 7 %
Lymphs Abs: 1 K/uL (ref 0.7–4.0)
MCH: 26.7 pg (ref 26.0–34.0)
MCHC: 31.9 g/dL (ref 30.0–36.0)
MCV: 83.8 fL (ref 80.0–100.0)
Monocytes Absolute: 1 K/uL (ref 0.1–1.0)
Monocytes Relative: 7 %
Neutro Abs: 12.1 K/uL — ABNORMAL HIGH (ref 1.7–7.7)
Neutrophils Relative %: 85 %
Platelets: 456 K/uL — ABNORMAL HIGH (ref 150–400)
RBC: 4.45 MIL/uL (ref 3.87–5.11)
RDW: 16.2 % — ABNORMAL HIGH (ref 11.5–15.5)
WBC: 14.3 K/uL — ABNORMAL HIGH (ref 4.0–10.5)
nRBC: 0 % (ref 0.0–0.2)

## 2023-10-22 MED ORDER — POTASSIUM CHLORIDE CRYS ER 20 MEQ PO TBCR
40.0000 meq | EXTENDED_RELEASE_TABLET | Freq: Once | ORAL | Status: AC
  Administered 2023-10-22: 40 meq via ORAL
  Filled 2023-10-22: qty 2

## 2023-10-22 NOTE — ED Notes (Signed)
 Pt unable to void....KM

## 2023-10-22 NOTE — ED Provider Triage Note (Signed)
 Emergency Medicine Provider Triage Evaluation Note  Josette Shimabukuro , a 78 y.o. female  was evaluated in triage.   Patient was brought in by EMS for possible seizures. Family has not seen any but per EMS is concerned. Patient reportedly recently had a seizure last week after using THC gummies for this first time. Patient tracks with eyes but does not respond to all questions.   Review of Systems  Positive: Unable to obtain.  Negative: Unable to obtain.   Physical Exam  BP (!) 149/80   Pulse 92   Temp 98.5 F (36.9 C)   Resp (!) 22   Ht 5' 2 (1.575 m)   Wt 83.5 kg   SpO2 99%   BMI 33.65 kg/m  Gen:   Awake, no distress   Resp:  Normal effort  Psych:  Odd affect, withdrawn   Medical Decision Making  Medically screening exam initiated at 10:08 PM.  Appropriate orders placed.  Kamara Allan was informed that the remainder of the evaluation will be completed by another provider, this initial triage assessment does not replace that evaluation, and the importance of remaining in the ED until their evaluation is complete.     Gennaro Duwaine CROME, DO 10/22/23 2208

## 2023-10-22 NOTE — ED Triage Notes (Signed)
 PT brought in by [facility] for possible Seizures. PT is alert and talking states no LOC, PT states no loss of bowels or bladder. PT family asked ems if he should bring her or let ems bring her. PT family states she was dazed off all day but when asked she would answer. PT had a seizure last hospital visit last week per PT. PT is A&OX4.

## 2023-10-23 ENCOUNTER — Observation Stay (HOSPITAL_COMMUNITY)

## 2023-10-23 DIAGNOSIS — E876 Hypokalemia: Secondary | ICD-10-CM | POA: Insufficient documentation

## 2023-10-23 DIAGNOSIS — G9341 Metabolic encephalopathy: Secondary | ICD-10-CM | POA: Insufficient documentation

## 2023-10-23 DIAGNOSIS — R569 Unspecified convulsions: Secondary | ICD-10-CM | POA: Diagnosis not present

## 2023-10-23 DIAGNOSIS — N179 Acute kidney failure, unspecified: Secondary | ICD-10-CM | POA: Insufficient documentation

## 2023-10-23 LAB — URINALYSIS, W/ REFLEX TO CULTURE (INFECTION SUSPECTED)
Bilirubin Urine: NEGATIVE
Glucose, UA: NEGATIVE mg/dL
Hgb urine dipstick: NEGATIVE
Ketones, ur: NEGATIVE mg/dL
Leukocytes,Ua: NEGATIVE
Nitrite: NEGATIVE
Protein, ur: 30 mg/dL — AB
Specific Gravity, Urine: 1.017 (ref 1.005–1.030)
pH: 5 (ref 5.0–8.0)

## 2023-10-23 LAB — CBC
HCT: 35.6 % — ABNORMAL LOW (ref 36.0–46.0)
Hemoglobin: 11.6 g/dL — ABNORMAL LOW (ref 12.0–15.0)
MCH: 27 pg (ref 26.0–34.0)
MCHC: 32.6 g/dL (ref 30.0–36.0)
MCV: 83 fL (ref 80.0–100.0)
Platelets: 405 K/uL — ABNORMAL HIGH (ref 150–400)
RBC: 4.29 MIL/uL (ref 3.87–5.11)
RDW: 16.4 % — ABNORMAL HIGH (ref 11.5–15.5)
WBC: 12.1 K/uL — ABNORMAL HIGH (ref 4.0–10.5)
nRBC: 0 % (ref 0.0–0.2)

## 2023-10-23 LAB — BASIC METABOLIC PANEL WITH GFR
Anion gap: 15 (ref 5–15)
BUN: 23 mg/dL (ref 8–23)
CO2: 22 mmol/L (ref 22–32)
Calcium: 9.2 mg/dL (ref 8.9–10.3)
Chloride: 102 mmol/L (ref 98–111)
Creatinine, Ser: 1.16 mg/dL — ABNORMAL HIGH (ref 0.44–1.00)
GFR, Estimated: 48 mL/min — ABNORMAL LOW (ref >60)
Glucose, Bld: 105 mg/dL — ABNORMAL HIGH (ref 70–99)
Potassium: 3.4 mmol/L — ABNORMAL LOW (ref 3.5–5.1)
Sodium: 139 mmol/L (ref 135–145)

## 2023-10-23 LAB — RAPID URINE DRUG SCREEN, HOSP PERFORMED
Amphetamines: NOT DETECTED
Barbiturates: NOT DETECTED
Benzodiazepines: POSITIVE — AB
Cocaine: NOT DETECTED
Opiates: NOT DETECTED
Tetrahydrocannabinol: POSITIVE — AB

## 2023-10-23 LAB — FOLATE: Folate: 18.6 ng/mL (ref >5.9)

## 2023-10-23 LAB — TSH: TSH: 0.709 u[IU]/mL (ref 0.350–4.500)

## 2023-10-23 LAB — MAGNESIUM: Magnesium: 2 mg/dL (ref 1.7–2.4)

## 2023-10-23 LAB — VITAMIN B12: Vitamin B-12: 192 pg/mL (ref 180–914)

## 2023-10-23 MED ORDER — MELATONIN 5 MG PO TABS
5.0000 mg | ORAL_TABLET | Freq: Every evening | ORAL | Status: DC | PRN
  Administered 2023-10-25: 5 mg via ORAL
  Filled 2023-10-23 (×2): qty 1

## 2023-10-23 MED ORDER — SODIUM CHLORIDE 0.9 % IV BOLUS
500.0000 mL | Freq: Once | INTRAVENOUS | Status: AC
  Administered 2023-10-23: 500 mL via INTRAVENOUS

## 2023-10-23 MED ORDER — ONDANSETRON HCL 4 MG/2ML IJ SOLN: 4.0000 mg | Freq: Four times a day (QID) | INTRAMUSCULAR | Status: DC | PRN

## 2023-10-23 MED ORDER — AMLODIPINE BESYLATE 5 MG PO TABS
5.0000 mg | ORAL_TABLET | Freq: Every day | ORAL | Status: DC
  Administered 2023-10-23 – 2023-10-26 (×4): 5 mg via ORAL
  Filled 2023-10-23 (×4): qty 1

## 2023-10-23 MED ORDER — MIDAZOLAM HCL 2 MG/2ML IJ SOLN
4.0000 mg | Freq: Once | INTRAMUSCULAR | Status: AC
  Administered 2023-10-23: 4 mg via INTRAVENOUS
  Filled 2023-10-23: qty 4

## 2023-10-23 MED ORDER — SODIUM CHLORIDE 0.9 % IV SOLN: INTRAVENOUS | Status: AC

## 2023-10-23 MED ORDER — PANTOPRAZOLE SODIUM 40 MG PO TBEC
40.0000 mg | DELAYED_RELEASE_TABLET | Freq: Every day | ORAL | Status: DC
  Administered 2023-10-23 – 2023-10-26 (×4): 40 mg via ORAL
  Filled 2023-10-23 (×4): qty 1

## 2023-10-23 MED ORDER — POLYETHYLENE GLYCOL 3350 17 G PO PACK: 17.0000 g | PACK | Freq: Every day | ORAL | Status: DC | PRN

## 2023-10-23 MED ORDER — ENOXAPARIN SODIUM 40 MG/0.4ML IJ SOSY
40.0000 mg | PREFILLED_SYRINGE | Freq: Every day | INTRAMUSCULAR | Status: DC
  Administered 2023-10-23 – 2023-10-24 (×2): 40 mg via SUBCUTANEOUS
  Filled 2023-10-23 (×3): qty 0.4

## 2023-10-23 MED ORDER — ONDANSETRON HCL 4 MG PO TABS: 4.0000 mg | ORAL_TABLET | Freq: Four times a day (QID) | ORAL | Status: DC | PRN

## 2023-10-23 MED ORDER — SODIUM CHLORIDE 0.9 % IV SOLN: INTRAVENOUS | Status: DC

## 2023-10-23 MED ORDER — LORAZEPAM 2 MG/ML IJ SOLN: 2.0000 mg | INTRAMUSCULAR | Status: DC | PRN

## 2023-10-23 MED ORDER — ACETAMINOPHEN 325 MG PO TABS: 650.0000 mg | ORAL_TABLET | Freq: Four times a day (QID) | ORAL | Status: DC | PRN

## 2023-10-23 MED ORDER — LIDOCAINE 5 % EX PTCH
1.0000 | MEDICATED_PATCH | Freq: Every day | CUTANEOUS | Status: DC
  Administered 2023-10-23 – 2023-10-26 (×4): 1 via TRANSDERMAL
  Filled 2023-10-23 (×4): qty 1

## 2023-10-23 MED ORDER — HYDRALAZINE HCL 20 MG/ML IJ SOLN: 5.0000 mg | Freq: Four times a day (QID) | INTRAMUSCULAR | Status: DC | PRN

## 2023-10-23 MED ORDER — ACETAMINOPHEN 650 MG RE SUPP: 650.0000 mg | Freq: Four times a day (QID) | RECTAL | Status: DC | PRN

## 2023-10-23 MED ORDER — ATORVASTATIN CALCIUM 10 MG PO TABS
20.0000 mg | ORAL_TABLET | Freq: Every day | ORAL | Status: DC
  Administered 2023-10-23 – 2023-10-25 (×3): 20 mg via ORAL
  Filled 2023-10-23 (×3): qty 2

## 2023-10-23 NOTE — Progress Notes (Signed)
 LTM EEG hooked up and running - no initial skin breakdown - push button tested - [facility] monitoring.

## 2023-10-23 NOTE — ED Provider Notes (Signed)
 [name] EMERGENCY DEPARTMENT AT [facility] Provider Note   CSN: [id number] Arrival date & time: 10/22/23  2130     Patient presents with: possible Seizures   [name] is a 78 y.o. female.   78 y/o female with a hx of HTN, HLD, CKD, depression and anxiety presents to the ED for altered mental status. Per spouse, at bedside, patient admitted on 10/17/23 for seizure-like activity in the setting of THC gummy ingestion. MRI and EEG reassuring. Discharged with driving precautions and outpatient neurology f/u. Husband reports that patient began seeming altered this afternoon. He would speak to her and she would not respond, though she was conscious. He describes the patient staring and seeming absent. Patient later had an episode while sitting in a recliner where her eyes were closed and her arms were contracted and appeared to be shaking/twitching. This lasted for a few minutes, per spouse. No tongue biting or incontinence, N/V,  head injury, cyanosis or apnea. No fevers since time of discharge. Spouse denies additional use of illicit substances.  The history is provided by the patient and the spouse.       Prior to Admission medications   Medication Sig Start Date End Date Taking? Authorizing Provider  amLODipine -benazepril  (LOTREL ) 5-20 MG capsule Take 1 capsule by mouth daily. 05/28/23   Wendolyn Jenkins Jansky, MD  atorvastatin  (LIPITOR) 20 MG tablet Take 1 tablet (20 mg total) by mouth daily. 05/28/23   Wendolyn Jenkins Jansky, MD  CALCIUM  PO Take 1,200 mg by mouth daily.    [provider]  fluticasone  (FLONASE ) 50 MCG/ACT nasal spray Place 1 spray into both nostrils daily as needed for allergies or rhinitis.    [provider]  lidocaine  (XYLOCAINE ) 2 % solution Use as directed 10 mLs in the mouth or throat every 6 (six) hours as needed for mouth pain. 05/15/22   Wendolyn Jenkins Jansky, MD  Multiple Vitamin (MULTIVITAMIN ADULT PO) Take 1 tablet by mouth daily.     [provider]  omeprazole  (PRILOSEC) 40 MG capsule Take 1 capsule (40 mg total) by mouth daily. 09/30/23   Wendolyn Jenkins Jansky, MD  QUEtiapine  (SEROQUEL ) 25 MG tablet Take 0.5-1 tablets (12.5-25 mg total) by mouth at bedtime. Start at 1/2 tablet and increase to full tablet if needed/tolerated. 10/04/23 12/03/23  Carvin Arvella HERO, MD  raloxifene  (EVISTA ) 60 MG tablet Take 1 tablet (60 mg total) by mouth daily. 05/28/23   Wendolyn Jenkins Jansky, MD  triamterene -hydrochlorothiazide  (DYAZIDE ) 37.5-25 MG capsule Take 1 capsule by mouth daily. 05/28/23   Wendolyn Jenkins Jansky, MD  venlafaxine  XR (EFFEXOR -XR) 150 MG 24 hr capsule Take 1 capsule (150 mg total) by mouth daily with breakfast. 06/13/23 12/31/23  Bahraini, Sarah A  zolpidem  (AMBIEN ) 5 MG tablet Take 1.5 tablets (7.5 mg total) by mouth at bedtime as needed for sleep. 10/08/23 11/08/23  Bahraini, Sarah A    Allergies: Penicillins, Prednisone , and Sulfa antibiotics    Review of Systems Ten systems reviewed and are negative for acute change, except as noted in the HPI.    Updated Vital Signs BP 136/64   Pulse 88   Temp 98.2 F (36.8 C) (Oral)   Resp (!) 23   Ht 5' 2 (1.575 m)   Wt 83.5 kg   SpO2 100%   BMI 33.65 kg/m   Physical Exam Vitals and nursing note reviewed.  Constitutional:      General: She is not in acute distress.    Appearance: She  is well-developed. She is not diaphoretic.     Comments: Patient laying in bed and looking around the room. Very little movement, will selectively respond to questions with one-word answers.  HENT:     Head: Normocephalic and atraumatic.  Eyes:     General: No scleral icterus.    Conjunctiva/sclera: Conjunctivae normal.  Cardiovascular:     Rate and Rhythm: Normal rate and regular rhythm.     Pulses: Normal pulses.  Pulmonary:     Effort: Pulmonary effort is normal. No respiratory distress.     Comments: Respirations even and unlabored Abdominal:     Comments: Abdomen soft, obese,  nondistended.  Musculoskeletal:        General: Normal range of motion.     Cervical back: Normal range of motion.  Skin:    General: Skin is warm and dry.     Coloration: Skin is not pale.     Findings: No erythema or rash.  Neurological:     Mental Status: She is alert.     Comments: GCS 15. Alert and oriented. Speech is goal oriented. Follows commands. No deficits appreciated to CN III-XII; symmetric eyebrow raise, no facial drooping, tongue midline. Normal phonation. Patient has equal grip strength bilaterally with 5/5 strength against resistance in all major muscle groups bilaterally. Sensation to light touch intact. Patient moves extremities without ataxia. Gait not assessed.  Psychiatric:        Behavior: Behavior normal.     (all labs ordered are listed, but only abnormal results are displayed) Labs Reviewed  BASIC METABOLIC PANEL WITH GFR - Abnormal; Notable for the following components:      Result Value   Potassium 3.1 (*)    CO2 19 (*)    Glucose, Bld 160 (*)    BUN 26 (*)    Creatinine, Ser 1.51 (*)    GFR, Estimated 35 (*)    Anion gap 19 (*)    All other components within normal limits  CBC WITH DIFFERENTIAL/PLATELET - Abnormal; Notable for the following components:   WBC 14.3 (*)    Hemoglobin 11.9 (*)    RDW 16.2 (*)    Platelets 456 (*)    Neutro Abs 12.1 (*)    Abs Immature Granulocytes 0.10 (*)    All other components within normal limits  URINALYSIS, W/ REFLEX TO CULTURE (INFECTION SUSPECTED) - Abnormal; Notable for the following components:   Protein, ur 30 (*)    Bacteria, UA RARE (*)    All other components within normal limits  RAPID URINE DRUG SCREEN, HOSP PERFORMED - Abnormal; Notable for the following components:   Benzodiazepines POSITIVE (*)    Tetrahydrocannabinol POSITIVE (*)    All other components within normal limits    EKG: EKG Interpretation Date/Time:  Monday October 22 2023 23:01:32 EDT Ventricular Rate:  104 PR  Interval:  150 QRS Duration:  70 QT Interval:  322 QTC Calculation: 423 R Axis:   113  Text Interpretation: Sinus tachycardia Right axis deviation Low voltage QRS Septal infarct , age undetermined Abnormal ECG When compared with ECG of 17-Oct-2023 17:45, HEART RATE has increased Confirmed by [name] ([id number]) on 10/22/2023 11:43:53 PM  Radiology: No results found.   Procedures   Medications Ordered in the ED  LORazepam  (ATIVAN ) injection 2 mg (has no administration in time range)  potassium chloride  SA (KLOR-CON  M) CR tablet 40 mEq (40 mEq Oral Given 10/22/23 2358)  sodium chloride  0.9 % bolus 500 mL (500 mLs  Intravenous New Bag/Given 10/23/23 0153)    Clinical Course as of 10/23/23 0254  Tue Oct 23, 2023  0001 Chart reviewed. Had normal EEG on 10/18/23. Negative brain MRI on 10/17/23 [KH]  0002 WBC(!): 14.3 Largely stable; elevated since initial visit on 10/17/23 up to 15.9.  [KH]  0003 Potassium(!): 3.1 Hypokalemia is new today, but unlikely to be contributing to episodes. Oral potassium ordered. [KH]  0003 Creatinine(!): 1.51 Elevated compared to discharge. Has had creatinine levels from 1.3 to 1.45 in the past. Hx of CKD. [KH]  0130 Spoke with Dr. Merrianne of Neurology who believes additional metabolic causes of AMS may need to be investigated. No indication for prolonged EEG monitoring from neurologic standpoint. Would not start patient on antiepileptic medication pending outpatient follow up. Stresses need for patient to discontinue use of THC edibles. [KH]  0145 Husband reports giving patient Ativan  yesterday to try and assist sleep given patient's hx of insomnia. Husband confirms he threw out the [facility] gummies and the city picked up trash yesterday. [KH]  38 Spoke with Dr Lanetta of [facility] who will inform her AM colleague of needs for assessment. Orders placed for observation admission. [KH]    Clinical Course User Index [KH] Keith Sor, PA-C                                 Medical  Decision Making Amount and/or Complexity of Data Reviewed Labs:  Decision-making details documented in ED Course.  Risk Prescription drug management. Decision regarding hospitalization.   This patient presents to the ED for concern of altered mental status, this involves an extensive number of treatment options, and is a complaint that carries with it a high risk of complications and morbidity.  The differential diagnosis includes seizure vs PNES vs electrolyte derangement vs intoxication vs ICH vs CVA   Co morbidities that complicate the patient evaluation  HTN HLD CKD Depression   Additional history obtained:  Additional history obtained from spouse, at bedside External records from outside source obtained and reviewed including MRI 9.3.25 which was negative. EEG on 9.4.25 normal.   Lab Tests:  I Ordered, and personally interpreted labs.  The pertinent results include:  WBC 14.3, Hgb 11.9, K 3.1, Creatinine 1.51 and BUN 26   Cardiac Monitoring:  The patient was maintained on a cardiac monitor.  I personally viewed and interpreted the cardiac monitored which showed an underlying rhythm of: NSR   Medicines ordered and prescription drug management:  I have reviewed the patients home medicines and have made adjustments as needed   Test Considered:  Head CT - felt low yield given recent reassuring imaging in the setting of similar symptoms.   Consultations Obtained:  I requested consultation with Dr. Merrianne of neurology and discussed lab and imaging findings as well as pertinent plan - they recommend further inpatient evaluation for metabolic causes of AMS   Problem List / ED Course:  As above   Reevaluation:  After the interventions noted above, I reevaluated the patient and found that they have :remained stable   Social Determinants of Health:  Ambulatory with walker   Dispostion:  After consideration of the diagnostic results and the patients  response to treatment, I feel that the patent would benefit from observation admission. IVF ordered for hydration. [facility] to admit.       Final diagnoses:  Altered mental status, unspecified altered mental status type  AKI (acute kidney  injury) [facility])    ED Discharge Orders     None          Keith Sor, PA-C 10/23/23 0254    Emma Lenis, MD 10/23/23 [phone]

## 2023-10-23 NOTE — ED Notes (Signed)
 Patient hit call light stating that she feels like she's having a seizure. RN at bedside, no changes in vital signs, no seizure like activity noted, able to answer all orientation questions appropriately during said seizure.

## 2023-10-23 NOTE — Care Management Obs Status (Signed)
 MEDICARE OBSERVATION STATUS NOTIFICATION   Patient Details  Name: [name] MRN: [id number] Date of Birth: 11-14-1945   Medicare Observation Status Notification Given:      Obs notice signed and copy given Claretta Deed 10/23/2023, 3:54 PM

## 2023-10-23 NOTE — Care Management Obs Status (Signed)
 MEDICARE OBSERVATION STATUS NOTIFICATION   Patient Details  Name: [name] MRN: [id number] Date of Birth: [birth year]   Medicare Observation Status Notification Given:       Claretta Deed 10/23/2023, 3:52 PM

## 2023-10-23 NOTE — H&P (Signed)
 History and Physical    [name] FMW:[id number] DOB: 1945-04-07 DOA: 10/22/2023  DOS: the patient was seen and examined on 10/22/2023  PCP: Wendolyn Jenkins Jansky, MD   Patient coming from: Home  I have personally briefly reviewed patient's old medical records in [facility] and CareEverywhere  HPI:   [name] is a 78 y.o. year old female with past medical history of primary insomnia, generalized anxiety, major depressive disorder, hypertension, hyperlipidemia, GERD, and CKD-III.  He presents to [facility] ED after husband noted her to be altered yesterday afternoon.  Husband reports that patient would not respond when he spoke to her and had episodes of staring off and seeming absent.  Husband also reported an episode with patient was seated and her eyes were closed and her arms were contracted and appeared to be shaking and twitching.  This episode lasted for a few minutes.  They deny fevers, use of THC Gummies or any additional illicit substances, incontinence, tongue biting, head injury, emesis, cyanosis, or apnea.  Patient endorses episodic myoclonus of the right and left upper and lower extremities.  Twitching of her lips also noted during interview/exam.  Of note, she was recently admitted for seizure-like activity 10/17/23-10/18/23, seen by neurology, and discharged home with outpatient follow-up.  She was not started on AEDs at this time as this was an unprovoked first seizure.  ED Course: On arrival to [facility] ED patient was noted to be afebrile temp 36.9 C, BP 149/80, HR 90, RR 22, SPO2 99% on room air.  Patient had recent CT head and MRI brain on 10/17/2023 and no imaging repeated in the ED this visit.  Labs notable for potassium 3.1, CO2 19, glucose 160, BUN 26, creatinine 1.51, anion gap 19, WBC 14.3, hemoglobin 11.9, platelets 456.  UDS positive for benzodiazepines and THC.  She was given 40 mEq p.o. K and a 500 mL and normal saline bolus. TRH contacted for  admission.  Review of Systems: As mentioned in the history of present illness. All other systems reviewed and are negative.   Review of Systems  Constitutional:  Negative for fever.  HENT:  Negative for congestion.   Respiratory:  Negative for cough and shortness of breath.   Cardiovascular:  Negative for chest pain and palpitations.  Gastrointestinal:  Negative for abdominal pain, nausea and vomiting.  Neurological:  Negative for dizziness, focal weakness, weakness and headaches.  Psychiatric/Behavioral:  Negative for substance abuse. The patient has insomnia.     Past Medical History:  Diagnosis Date   Allergy 1980's   Sulfa drugs and penecillin   Anxiety    Cataract 2021   Corrected   Chronic kidney disease 2024   Stage 3   Depression    Headache    SINUS   Hyperlipidemia    Hypertension    Osteopenia    Osteopenia    Primary localized osteoarthritis of right knee     Past Surgical History:  Procedure Laterality Date   BREAST EXCISIONAL BIOPSY Right    benign cyst removal    CARPAL TUNNEL RELEASE Right 2015   Ortho in [place]   CHOLECYSTECTOMY N/A 02/08/2022   Procedure: LAPAROSCOPIC CHOLECYSTECTOMY WITH INTRAOPERATIVE CHOLANGIOGRAM;  Surgeon: Debby Hila, MD;  Location: [facility];  Service: General;  Laterality: N/A;   EYE SURGERY  12/2019   cataracts   FRACTURE SURGERY  2016   Rt.  leg   JOINT REPLACEMENT  09/2015   Right knee  LASIK     MENISCUS REPAIR Right 2011   Orthopedic in [facility]   TOTAL KNEE ARTHROPLASTY Right 09/20/2015   TOTAL KNEE ARTHROPLASTY Right 09/20/2015   Procedure: TOTAL KNEE ARTHROPLASTY;  Surgeon: Lamar Millman, MD;  Location: [facility] OR;  Service: Orthopedics;  Laterality: Right;   Trigger Thumb Right 2015   Done in [facility] at the same time as the carpal tunnel release   TUBAL LIGATION  1980     reports that she has never smoked. She has never used smokeless tobacco. She reports that she does not currently use alcohol. She reports  that she does not use drugs.  Allergies  Allergen Reactions   Penicillins Hives    Tolerates Cephalosporins    Prednisone  Other (See Comments)    Hyperactivity, sleep disturbance, confusion   Sulfa Antibiotics Hives         Family History  Problem Relation Age of Onset   Heart disease Mother    Arthritis Mother    Heart failure Mother    Heart attack Mother    Anxiety disorder Mother    Depression Mother    Hearing loss Mother    Varicose Veins Mother    Early death Father    Heart attack Father    Hearing loss Father    Heart disease Father    Hyperlipidemia Sister    Heart disease Sister    Hearing loss Sister    Arthritis Sister    Irregular heart beat Sister    Hypertension Sister    Hypertension Brother    Hyperlipidemia Brother    Heart attack Brother        Leisure centre manager (Brother)   Asthma Brother    Alcohol abuse Brother    Heart disease Brother    Early death Brother    Heart attack Brother    Depression Son    Alcohol abuse Maternal Aunt     Prior to Admission medications   Medication Sig Start Date End Date Taking? Authorizing Provider  amLODipine -benazepril  (LOTREL ) 5-20 MG capsule Take 1 capsule by mouth daily. 05/28/23   Wendolyn Jenkins Jansky, MD  atorvastatin  (LIPITOR) 20 MG tablet Take 1 tablet (20 mg total) by mouth daily. 05/28/23   Wendolyn Jenkins Jansky, MD  CALCIUM  PO Take 1,200 mg by mouth daily.    [provider]  fluticasone  (FLONASE ) 50 MCG/ACT nasal spray Place 1 spray into both nostrils daily as needed for allergies or rhinitis.    [provider]  lidocaine  (XYLOCAINE ) 2 % solution Use as directed 10 mLs in the mouth or throat every 6 (six) hours as needed for mouth pain. 05/15/22   Wendolyn Jenkins Jansky, MD  Multiple Vitamin (MULTIVITAMIN ADULT PO) Take 1 tablet by mouth daily.    [provider]  omeprazole  (PRILOSEC) 40 MG capsule Take 1 capsule (40 mg total) by mouth daily. 09/30/23   Wendolyn Jenkins Jansky, MD  QUEtiapine  (SEROQUEL )  25 MG tablet Take 0.5-1 tablets (12.5-25 mg total) by mouth at bedtime. Start at 1/2 tablet and increase to full tablet if needed/tolerated. 10/04/23 12/03/23  Carvin Arvella HERO, MD  raloxifene  (EVISTA ) 60 MG tablet Take 1 tablet (60 mg total) by mouth daily. 05/28/23   Wendolyn Jenkins Jansky, MD  triamterene -hydrochlorothiazide  (DYAZIDE ) 37.5-25 MG capsule Take 1 capsule by mouth daily. 05/28/23   Wendolyn Jenkins Jansky, MD  venlafaxine  XR (EFFEXOR -XR) 150 MG 24 hr capsule Take 1 capsule (150 mg total) by mouth daily with breakfast. 06/13/23 12/31/23  Mercy Domino  A  zolpidem  (AMBIEN ) 5 MG tablet Take 1.5 tablets (7.5 mg total) by mouth at bedtime as needed for sleep. 10/08/23 11/08/23  Mercy Lauraine LABOR    Physical Exam: Vitals:   10/23/23 0230 10/23/23 0545 10/23/23 0630 10/23/23 0700  BP:  (!) 150/67 (!) 150/60 (!) 149/60  Pulse:  68 83 83  Resp:  (!) 24 19 (!) 23  Temp: 98.2 F (36.8 C)  97.9 F (36.6 C)   TempSrc: Oral  Oral   SpO2:  97% 98% 98%  Weight:      Height:        Physical Exam Vitals and nursing note reviewed.  HENT:     Head: Normocephalic.  Eyes:     Pupils: Pupils are equal, round, and reactive to light.  Cardiovascular:     Rate and Rhythm: Normal rate and regular rhythm.     Pulses: Normal pulses.     Heart sounds: No murmur heard.    No friction rub. No gallop.  Pulmonary:     Effort: Pulmonary effort is normal. No respiratory distress.     Breath sounds: No wheezing, rhonchi or rales.  Musculoskeletal:     Cervical back: Normal range of motion.  Skin:    General: Skin is warm and dry.     Capillary Refill: Capillary refill takes less than 2 seconds.  Neurological:     General: No focal deficit present.     Mental Status: She is alert and oriented to person, place, and time. Mental status is at baseline.     Sensory: No sensory deficit.     Motor: No weakness.     Deep Tendon Reflexes: Reflexes normal.     Comments: Delayed responses in conversation. (+) myoclonus  of RLE noted during my interview/exam      Labs on Admission: I have personally reviewed following labs and imaging studies  CBC: Recent Labs  Lab 10/17/23 1801 10/17/23 1838 10/17/23 2300 10/18/23 0439 10/22/23 2220  WBC 11.3*  --  15.9* 12.8* 14.3*  NEUTROABS 8.7*  --   --   --  12.1*  HGB 12.5 13.3 11.4* 11.7* 11.9*  HCT 39.1 39.0 35.4* 36.1 37.3  MCV 84.8  --  83.9 82.8 83.8  PLT 429*  --  391 390 456*   Basic Metabolic Panel: Recent Labs  Lab 10/17/23 1801 10/17/23 1838 10/17/23 2300 10/18/23 0439 10/22/23 2220  NA 135 134*  --  138 138  K 3.5 3.5  --  3.7 3.1*  CL 98  --   --  103 100  CO2 14*  --   --  19* 19*  GLUCOSE 191*  --   --  112* 160*  BUN 18  --   --  19 26*  CREATININE 1.39*  --  1.01* 1.07* 1.51*  CALCIUM  9.2  --   --  8.9 9.2  MG 2.1  --   --  2.1  --   PHOS  --   --   --  3.2  --    GFR: Estimated Creatinine Clearance: 30.8 mL/min (A) (by C-G formula based on SCr of 1.51 mg/dL (H)). Liver Function Tests: Recent Labs  Lab 10/17/23 1801  AST 52*  ALT 32  ALKPHOS 121  BILITOT 0.7  PROT 7.4  ALBUMIN 3.6   Recent Labs  Lab 10/17/23 1801  LIPASE 30   No results for input(s): AMMONIA in the last 168 hours. Coagulation Profile: No results for input(s): INR,  PROTIME in the last 168 hours. Cardiac Enzymes: Recent Labs  Lab 10/17/23 1801 10/17/23 2023  CKTOTAL 67  --   TROPONINIHS 15 20*   BNP (last 3 results) Recent Labs    12/25/22 1347  BNP 76.9   HbA1C: No results for input(s): HGBA1C in the last 72 hours. CBG: Recent Labs  Lab 10/17/23 1742 10/18/23 0451 10/18/23 0805 10/18/23 1257  GLUCAP 103* 126* 136* 111*   Lipid Profile: No results for input(s): CHOL, HDL, LDLCALC, TRIG, CHOLHDL, LDLDIRECT in the last 72 hours. Thyroid  Function Tests: No results for input(s): TSH, T4TOTAL, FREET4, T3FREE, THYROIDAB in the last 72 hours. Anemia Panel: No results for input(s): VITAMINB12,  FOLATE, FERRITIN, TIBC, IRON, RETICCTPCT in the last 72 hours. Urine analysis:    Component Value Date/Time   COLORURINE YELLOW 10/23/2023 0015   APPEARANCEUR CLEAR 10/23/2023 0015   LABSPEC 1.017 10/23/2023 0015   PHURINE 5.0 10/23/2023 0015   GLUCOSEU NEGATIVE 10/23/2023 0015   HGBUR NEGATIVE 10/23/2023 0015   BILIRUBINUR NEGATIVE 10/23/2023 0015   KETONESUR NEGATIVE 10/23/2023 0015   PROTEINUR 30 (A) 10/23/2023 0015   NITRITE NEGATIVE 10/23/2023 0015   LEUKOCYTESUR NEGATIVE 10/23/2023 0015    Radiological Exams on Admission: I have personally reviewed images No results found.  EKG: My personal interpretation of EKG shows: Sinus Tachycardia HR 104   Assessment/Plan Principal Problem:   Seizure (HCC)   Acute Medical Issues: #Seizure #Acute Metabolic Encephalopathy - PRN IV Ativan  - CT head and MRI brain obtained on 10/17/23, will not repeat at this time - Check folate, B12, TSH - Hold home Seroquel  and Effexor  - UDS in ED positive for benzodiazepines and [facility] - Neurology consulted, appreciate their recommendations and management  #Acute Kidney Injury on chronic kidney disease - Hold nephrotoxic meds: Patient on home Lotrel , continue amlodipine  hold benazepril ; Hold home triamterene -hydrochlorothiazide  - IVF hydration - Avoid nephrotoxins, contrast Dyes, Hypotension and Dehydration - Repeat metabolic panel with a.m. labs  #Hypokalemia - s/p 62 mEQ K in ED - Repeat K with AM labs  Chronic medical issues: #Hypertension  - Patient on home Lotrel , continue amlodipine  hold benazepril  - Hold home triamterene -hydrochlorothiazide  - PRN hydralazine   #Hyperlipidemia - Continue home atorvastatin   #GERD -Protonix   #Primary insomnia  - Continue home Ambien   #Generalized anxiety  #Major depressive disorder - Hold home Seroquel  and Effexor    VTE prophylaxis:  Lovenox   GI prophylaxis: Not indicated  Diet: Heart Healthy/Carb Modified Access: PIV Lines:  None Code Status:  Full Code Telemetry: Yes  Admission status: Inpatient, Telemetry bed Patient is from: Home Anticipated d/c is to: Home  Anticipated d/c date is: 1 day Patient currently: Pending neurology consultation and workup for metabolic causes of encephalopathy  Family Communication: Family at bedside, plan of care discussed with patient.  Questions welcomed and elicited.  Questions answered to patient's breath satisfaction.  Attending physician later discussed with Husband after he returned to bedside.   Consults called: Dr Vanessa, Neurology    Severity of Illness: The appropriate patient status for this patient is OBSERVATION. Observation status is judged to be reasonable and necessary in order to provide the required intensity of service to ensure the patient's safety. The patient's presenting symptoms, physical exam findings, and initial radiographic and laboratory data in the context of their medical condition is felt to place them at decreased risk for further clinical deterioration. Furthermore, it is anticipated that the patient will be medically stable for discharge from the hospital within 2 midnights of admission.  To reach the provider On-Call:   7AM- 7PM see care teams to locate the attending and reach out to them via [email or web address] Password: [email or web address] 7PM-7AM contact night-coverage If you still have difficulty reaching the appropriate provider, please page the [facility] (Director on Call) for [facility] on amion for assistance  This document was prepared using Conservation officer, historic buildings and may include unintentional dictation errors.  Rockie Rams FNP-BC, PMHNP-BC Nurse Practitioner [facility]

## 2023-10-23 NOTE — ED Notes (Signed)
 Offer to check pt and change if need to be. Pt refused.

## 2023-10-23 NOTE — ED Notes (Signed)
 A heart healthy/carb modified house breakfast tray ordered for the patient.

## 2023-10-23 NOTE — Consult Note (Signed)
 NEUROLOGY CONSULT NOTE   Date of service: October 23, 2023 Patient Name: [name] MRN:  [id number] DOB:  03/03/1945 Chief Complaint: Seizure activity Requesting Provider: Rojelio Nest, DO  History of Present Illness  [name] is a 78 y.o. female with hx of anxiety, depression, hypertension, hyperlipidemia, CKD and chronic insomnia who presents with episodes of decreased responsiveness, staring off into space and an episode where she became unresponsive and began jerking bilateral arms.  Patient reports that last week, she purchased a THC gummy from a local dispensary in an attempt to relieve her insomnia.  She ate about a quarter of the gummy and had a GTC seizure.  She was seen in the hospital and discharged.  She does not intend to use THC Gummies again.  She has never had a seizure prior to that, has never had a serious head injury, has never had intracranial bleeding or surgery on her brain, has never had meningitis or encephalitis and had no problems with her birth or development.  Since being discharged from the hospital, she has had episodes where her speech is slightly delayed and she stares off into space, becoming unresponsive to voice.  Yesterday evening, she had 1 of these episodes and also had jerking of bilateral arms and was brought to the emergency department by her husband.  She reports no recent illnesses and no recent other symptoms.   ROS  Comprehensive ROS performed and pertinent positives documented in HPI   Past History   Past Medical History:  Diagnosis Date   Allergy 1980's   Sulfa drugs and penecillin   Anxiety    Cataract 2021   Corrected   Chronic kidney disease 2024   Stage 3   Depression    Headache    SINUS   Hyperlipidemia    Hypertension    Osteopenia    Osteopenia    Primary localized osteoarthritis of right knee     Past Surgical History:  Procedure Laterality Date   BREAST EXCISIONAL BIOPSY Right    benign cyst  removal    CARPAL TUNNEL RELEASE Right 2015   Ortho in [place]   CHOLECYSTECTOMY N/A 02/08/2022   Procedure: LAPAROSCOPIC CHOLECYSTECTOMY WITH INTRAOPERATIVE CHOLANGIOGRAM;  Surgeon: Debby Hila, MD;  Location: [facility];  Service: General;  Laterality: N/A;   EYE SURGERY  12/2019   cataracts   FRACTURE SURGERY  2016   Rt.  leg   JOINT REPLACEMENT  09/2015   Right knee   LASIK     MENISCUS REPAIR Right 2011   Orthopedic in [facility]   TOTAL KNEE ARTHROPLASTY Right 09/20/2015   TOTAL KNEE ARTHROPLASTY Right 09/20/2015   Procedure: TOTAL KNEE ARTHROPLASTY;  Surgeon: Lamar Millman, MD;  Location: [facility] OR;  Service: Orthopedics;  Laterality: Right;   Trigger Thumb Right 2015   Done in [place] at the same time as the carpal tunnel release   TUBAL LIGATION  1980    Family History: Family History  Problem Relation Age of Onset   Heart disease Mother    Arthritis Mother    Heart failure Mother    Heart attack Mother    Anxiety disorder Mother    Depression Mother    Hearing loss Mother    Varicose Veins Mother    Early death Father    Heart attack Father    Hearing loss Father    Heart disease Father    Hyperlipidemia Sister    Heart disease Sister    Hearing  loss Sister    Arthritis Sister    Irregular heart beat Sister    Hypertension Sister    Hypertension Brother    Hyperlipidemia Brother    Heart attack Brother        [name]   Asthma Brother    Alcohol abuse Brother    Heart disease Brother    Early death Brother    Heart attack Brother    Depression Son    Alcohol abuse Maternal Aunt     Social History  reports that she has never smoked. She has never used smokeless tobacco. She reports that she does not currently use alcohol. She reports that she does not use drugs.  Allergies  Allergen Reactions   Penicillins Hives    Tolerates Cephalosporins    Prednisone  Other (See Comments)    Hyperactivity Sleep disturbance Confusion   Sulfa Antibiotics  Hives         Medications   Current Facility-Administered Medications:    0.9 %  sodium chloride  infusion, , Intravenous, Continuous, Rojelio Nest, DO, Last Rate: 75 mL/hr at 10/23/23 1530, New Bag at 10/23/23 1530   acetaminophen  (TYLENOL ) tablet 650 mg, 650 mg, Oral, Q6H PRN **OR** acetaminophen  (TYLENOL ) suppository 650 mg, 650 mg, Rectal, Q6H PRN, Foust, Katy L, NP   amLODipine  (NORVASC ) tablet 5 mg, 5 mg, Oral, Daily, Foust, Katy L, NP, 5 mg at 10/23/23 1150   atorvastatin  (LIPITOR) tablet 20 mg, 20 mg, Oral, QHS, Foust, Katy L, NP   enoxaparin  (LOVENOX ) injection 40 mg, 40 mg, Subcutaneous, Daily, Foust, Katy L, NP, 40 mg at 10/23/23 0930   hydrALAZINE  (APRESOLINE ) injection 5 mg, 5 mg, Intravenous, Q6H PRN, Foust, Katy L, NP   lidocaine  (LIDODERM ) 5 % 1 patch, 1 patch, Transdermal, Daily, Foust, Katy L, NP, 1 patch at 10/23/23 0936   LORazepam  (ATIVAN ) injection 2 mg, 2 mg, Intravenous, Q4H PRN, Agbata, Tochukwu, MD   melatonin tablet 5 mg, 5 mg, Oral, QHS PRN, Foust, Katy L, NP   ondansetron  (ZOFRAN ) tablet 4 mg, 4 mg, Oral, Q6H PRN **OR** ondansetron  (ZOFRAN ) injection 4 mg, 4 mg, Intravenous, Q6H PRN, Foust, Katy L, NP   pantoprazole  (PROTONIX ) EC tablet 40 mg, 40 mg, Oral, Daily, Foust, Katy L, NP, 40 mg at 10/23/23 0930   polyethylene glycol (MIRALAX  / GLYCOLAX ) packet 17 g, 17 g, Oral, Daily PRN, Foust, Katy L, NP  Vitals   Vitals:   10/23/23 1300 10/23/23 1400 10/23/23 1421 10/23/23 1451  BP: (!) 155/65 (!) 171/71  (!) 168/66  Pulse: 94 92  (!) 105  Resp: (!) 22 (!) 31  20  Temp:   98.4 F (36.9 C) 98.9 F (37.2 C)  TempSrc:   Oral Oral  SpO2: 100% 100%  100%  Weight:      Height:        Body mass index is 33.65 kg/m.   Physical Exam   Constitutional: Appears well-developed and well-nourished.  Psych: Appears somewhat anxious, periodic facial twitching noted Eyes: No scleral injection.  HENT: No OP obstruction.  Head: Normocephalic.  Respiratory: Effort  normal, non-labored breathing.  Skin: WDI.   Neurologic Examination   Mental Status: AA&Ox3, able to give clear and coherent history of present illness, with occasional delayed responses Speech/Language: speech is mainly in short phrases without dysarthria, which is different from baseline per husband. Cranial Nerves:  II: PERRL.  III, IV, VI: EOMI. Eyelids elevate symmetrically.  V: Sensation is intact to light touch with some sinus  pressure noted on the right VII: Smile is symmetrical.  VIII: hearing intact to voice. IX, X: Phonation is normal.  KP:Dynloizm shrug 5/5. XII: tongue is midline without fasciculations. Motor: Able to move all 4 extremities with good antigravity strength Tone: is normal and bulk is normal Sensation- Intact to light touch bilaterally with touch feeling warmer on the right Coordination: FTN intact bilaterally Gait- deferred   Labs/Imaging/Neurodiagnostic studies   CBC:  Recent Labs  Lab 14-Nov-2023 1801 11-14-23 1838 10/22/23 2220 10/23/23 0743  WBC 11.3*   < > 14.3* 12.1*  NEUTROABS 8.7*  --  12.1*  --   HGB 12.5   < > 11.9* 11.6*  HCT 39.1   < > 37.3 35.6*  MCV 84.8   < > 83.8 83.0  PLT 429*   < > 456* 405*   < > = values in this interval not displayed.   Basic Metabolic Panel:  Lab Results  Component Value Date   NA 139 10/23/2023   K 3.4 (L) 10/23/2023   CO2 22 10/23/2023   GLUCOSE 105 (H) 10/23/2023   BUN 23 10/23/2023   CREATININE 1.16 (H) 10/23/2023   CALCIUM  9.2 10/23/2023   GFRNONAA 48 (L) 10/23/2023   GFRAA 53 (L) 06/30/2020   Lipid Panel:  Lab Results  Component Value Date   LDLCALC 62 05/28/2023   HgbA1c:  Lab Results  Component Value Date   HGBA1C 6.3 05/28/2023   Urine Drug Screen:     Component Value Date/Time   LABOPIA NONE DETECTED 10/23/2023 0015   COCAINSCRNUR NONE DETECTED 10/23/2023 0015   LABBENZ POSITIVE (A) 10/23/2023 0015   AMPHETMU NONE DETECTED 10/23/2023 0015   THCU POSITIVE (A) 10/23/2023  0015   LABBARB NONE DETECTED 10/23/2023 0015    Alcohol Level No results found for: [facility] INR  Lab Results  Component Value Date   INR 1.03 09/10/2015   APTT  Lab Results  Component Value Date   APTT 25 09/10/2015   CT Head without contrast(Personally reviewed): No acute abnormality  MRI Brain(Personally reviewed): No acute abnormality  Neurodiagnostics Continuous EEG:  Pending  ASSESSMENT   Dashonda Bonneau is a 78 y.o. female with hx of anxiety, depression, hypertension, hyperlipidemia, CKD and chronic insomnia who presents with recurrent episodes of decreased responsiveness and seizure activity yesterday evening.  She reports having a GTC seizure about a week ago when she took a THC gummy to relieve her insomnia.  She has never had a seizure before that and does not have any notable seizure risk factors.  Since discharge from that hospitalization, she has had episodes of delayed speech responses, staring off into space and not responding to others and 1 episode of staring off into space which was accompanied by jerking of bilateral arms.  On exam, she is anxious appearing with occasional facial and arm twitching that is nonrhythmic and asynchronous.  Slightly delayed verbal and motor responses are noted during the assessment.  Patient reports that she has not had any more THC Gummies since her initial admission and does not intend to consume these again.  CT head and MRI brain from 14-Nov-2023 demonstrated no acute abnormalities.  Will place patient on LTM EEG for spell capture.  RECOMMENDATIONS  - LTM EEG - Hold off on AEDs until LTM EEG results are in - Neurology will continue to follow  Addendum:  - LTM EEG review reveals rhythmic discharges as well as sharply contoured waves in the vertex region.  - Versed  challenge with  4 mg IV has been ordered to assess response on EEG tracings. If just slowing after Versed , then more likely to be a metabolic encephalopathy. If EEG improves to  alpha wave activity, then seizures are more likely and will load with Keppra.  ______________________________________________________________________  Patient seen by NP and then by MD. Signed, Cortney E Everitt Clint Kill, NP [facility]   I have seen and examined the patient. I have formulated the assessment and recommendations. 78 year old female re-presenting with seizure-like activity. Exam with intermittent face and arm twitching as well as increased latencies of verbal and motor responses. Recommendations as above.  Electronically signed: Dr. Maylani Embree

## 2023-10-23 NOTE — ED Notes (Signed)
 CCMD called to place patient on monitor

## 2023-10-24 ENCOUNTER — Encounter (HOSPITAL_COMMUNITY): Payer: Self-pay

## 2023-10-24 ENCOUNTER — Observation Stay (HOSPITAL_COMMUNITY)

## 2023-10-24 ENCOUNTER — Inpatient Hospital Stay (HOSPITAL_COMMUNITY)

## 2023-10-24 ENCOUNTER — Telehealth: Payer: Self-pay | Admitting: *Deleted

## 2023-10-24 ENCOUNTER — Other Ambulatory Visit (HOSPITAL_COMMUNITY): Payer: Self-pay

## 2023-10-24 ENCOUNTER — Encounter (HOSPITAL_COMMUNITY)

## 2023-10-24 ENCOUNTER — Other Ambulatory Visit (HOSPITAL_COMMUNITY): Payer: Self-pay | Admitting: Psychiatry

## 2023-10-24 DIAGNOSIS — Z8249 Family history of ischemic heart disease and other diseases of the circulatory system: Secondary | ICD-10-CM | POA: Diagnosis not present

## 2023-10-24 DIAGNOSIS — F3340 Major depressive disorder, recurrent, in remission, unspecified: Secondary | ICD-10-CM | POA: Diagnosis present

## 2023-10-24 DIAGNOSIS — N179 Acute kidney failure, unspecified: Secondary | ICD-10-CM

## 2023-10-24 DIAGNOSIS — F5101 Primary insomnia: Secondary | ICD-10-CM

## 2023-10-24 DIAGNOSIS — G928 Other toxic encephalopathy: Secondary | ICD-10-CM | POA: Diagnosis present

## 2023-10-24 DIAGNOSIS — E872 Acidosis, unspecified: Secondary | ICD-10-CM | POA: Diagnosis present

## 2023-10-24 DIAGNOSIS — G4700 Insomnia, unspecified: Secondary | ICD-10-CM | POA: Diagnosis present

## 2023-10-24 DIAGNOSIS — I10 Essential (primary) hypertension: Secondary | ICD-10-CM | POA: Diagnosis not present

## 2023-10-24 DIAGNOSIS — Z882 Allergy status to sulfonamides status: Secondary | ICD-10-CM | POA: Diagnosis not present

## 2023-10-24 DIAGNOSIS — Z6833 Body mass index (BMI) 33.0-33.9, adult: Secondary | ICD-10-CM | POA: Diagnosis not present

## 2023-10-24 DIAGNOSIS — R569 Unspecified convulsions: Secondary | ICD-10-CM | POA: Diagnosis present

## 2023-10-24 DIAGNOSIS — K219 Gastro-esophageal reflux disease without esophagitis: Secondary | ICD-10-CM | POA: Diagnosis present

## 2023-10-24 DIAGNOSIS — G9340 Encephalopathy, unspecified: Secondary | ICD-10-CM

## 2023-10-24 DIAGNOSIS — Z96651 Presence of right artificial knee joint: Secondary | ICD-10-CM | POA: Diagnosis present

## 2023-10-24 DIAGNOSIS — M8580 Other specified disorders of bone density and structure, unspecified site: Secondary | ICD-10-CM | POA: Diagnosis present

## 2023-10-24 DIAGNOSIS — N1831 Chronic kidney disease, stage 3a: Secondary | ICD-10-CM | POA: Diagnosis present

## 2023-10-24 DIAGNOSIS — E66811 Obesity, class 1: Secondary | ICD-10-CM | POA: Diagnosis present

## 2023-10-24 DIAGNOSIS — E1122 Type 2 diabetes mellitus with diabetic chronic kidney disease: Secondary | ICD-10-CM | POA: Diagnosis present

## 2023-10-24 DIAGNOSIS — T40715A Adverse effect of cannabis, initial encounter: Secondary | ICD-10-CM | POA: Diagnosis present

## 2023-10-24 DIAGNOSIS — F411 Generalized anxiety disorder: Secondary | ICD-10-CM

## 2023-10-24 DIAGNOSIS — R197 Diarrhea, unspecified: Secondary | ICD-10-CM | POA: Diagnosis not present

## 2023-10-24 DIAGNOSIS — R410 Disorientation, unspecified: Secondary | ICD-10-CM | POA: Diagnosis not present

## 2023-10-24 DIAGNOSIS — I129 Hypertensive chronic kidney disease with stage 1 through stage 4 chronic kidney disease, or unspecified chronic kidney disease: Secondary | ICD-10-CM | POA: Diagnosis present

## 2023-10-24 DIAGNOSIS — R451 Restlessness and agitation: Secondary | ICD-10-CM | POA: Diagnosis present

## 2023-10-24 DIAGNOSIS — Z79899 Other long term (current) drug therapy: Secondary | ICD-10-CM | POA: Diagnosis not present

## 2023-10-24 DIAGNOSIS — G3184 Mild cognitive impairment, so stated: Secondary | ICD-10-CM | POA: Diagnosis not present

## 2023-10-24 DIAGNOSIS — E785 Hyperlipidemia, unspecified: Secondary | ICD-10-CM | POA: Diagnosis present

## 2023-10-24 DIAGNOSIS — E876 Hypokalemia: Secondary | ICD-10-CM | POA: Diagnosis present

## 2023-10-24 LAB — CBC
HCT: 31.6 % — ABNORMAL LOW (ref 36.0–46.0)
Hemoglobin: 10.2 g/dL — ABNORMAL LOW (ref 12.0–15.0)
MCH: 27.4 pg (ref 26.0–34.0)
MCHC: 32.3 g/dL (ref 30.0–36.0)
MCV: 84.9 fL (ref 80.0–100.0)
Platelets: 361 K/uL (ref 150–400)
RBC: 3.72 MIL/uL — ABNORMAL LOW (ref 3.87–5.11)
RDW: 16.4 % — ABNORMAL HIGH (ref 11.5–15.5)
WBC: 11.6 K/uL — ABNORMAL HIGH (ref 4.0–10.5)
nRBC: 0 % (ref 0.0–0.2)

## 2023-10-24 LAB — BASIC METABOLIC PANEL WITH GFR
Anion gap: 13 (ref 5–15)
BUN: 22 mg/dL (ref 8–23)
CO2: 21 mmol/L — ABNORMAL LOW (ref 22–32)
Calcium: 8.6 mg/dL — ABNORMAL LOW (ref 8.9–10.3)
Chloride: 107 mmol/L (ref 98–111)
Creatinine, Ser: 1.07 mg/dL — ABNORMAL HIGH (ref 0.44–1.00)
GFR, Estimated: 53 mL/min — ABNORMAL LOW (ref >60)
Glucose, Bld: 110 mg/dL — ABNORMAL HIGH (ref 70–99)
Potassium: 3.2 mmol/L — ABNORMAL LOW (ref 3.5–5.1)
Sodium: 141 mmol/L (ref 135–145)

## 2023-10-24 LAB — AMMONIA: Ammonia: 38 umol/L — ABNORMAL HIGH (ref 9–35)

## 2023-10-24 LAB — MAGNESIUM: Magnesium: 1.7 mg/dL (ref 1.7–2.4)

## 2023-10-24 LAB — HIV ANTIBODY (ROUTINE TESTING W REFLEX): HIV Screen 4th Generation wRfx: NONREACTIVE

## 2023-10-24 MED ORDER — GADOBUTROL 1 MMOL/ML IV SOLN
8.0000 mL | Freq: Once | INTRAVENOUS | Status: AC | PRN
  Administered 2023-10-24: 8 mL via INTRAVENOUS

## 2023-10-24 MED ORDER — POTASSIUM CHLORIDE CRYS ER 20 MEQ PO TBCR
40.0000 meq | EXTENDED_RELEASE_TABLET | Freq: Once | ORAL | Status: AC
  Administered 2023-10-24: 40 meq via ORAL
  Filled 2023-10-24: qty 2

## 2023-10-24 MED ORDER — VITAMIN B-12 1000 MCG PO TABS
500.0000 ug | ORAL_TABLET | Freq: Every day | ORAL | Status: DC
  Administered 2023-10-24 – 2023-10-26 (×3): 500 ug via ORAL
  Filled 2023-10-24 (×3): qty 1

## 2023-10-24 MED ORDER — OXCARBAZEPINE 150 MG PO TABS
150.0000 mg | ORAL_TABLET | Freq: Two times a day (BID) | ORAL | Status: DC
  Administered 2023-10-24: 150 mg via ORAL
  Filled 2023-10-24 (×2): qty 1

## 2023-10-24 NOTE — Telephone Encounter (Signed)
 Copied from CRM #[id number]. Topic: Clinical - Home Health Verbal Orders >> Oct 24, 2023  3:57 PM Terri MATSU wrote: Caller/Agency: Wanda from [place] [facility] Callback Number: [phone] Service Requested: Physical Therapy Frequency: 2x a week and 1x a week for 4 Any new concerns about the patient? No

## 2023-10-24 NOTE — Progress Notes (Signed)
 Subjective: No further seizure-like episodes overnight.  This morning patient is awake, alert, answering all questions.  States she has been staring off intermittently for the last week.  Denies any further gummy use or illicit drugs.  Denies any changes in medications.  Denies any recent travel, sick contacts, new pets in the house.  Denies headache  ROS: negative except above Examination  Vital signs in last 24 hours: Temp:  [97.7 F (36.5 C)-98.9 F (37.2 C)] 97.7 F (36.5 C) (09/10 0737) Pulse Rate:  [70-105] 70 (09/10 0737) Resp:  [20-31] 20 (09/10 0435) BP: (131-171)/(57-82) 131/82 (09/10 0737) SpO2:  [96 %-100 %] 99 % (09/10 0737)  General: lying in bed, NAD Neuro: MS: Alert, oriented, follows commands CN: pupils equal and reactive,  EOMI, face symmetric, tongue midline, normal sensation over face, Motor: 5/5 strength in all 4 extremities Coordination: normal Gait: not tested  Basic Metabolic Panel: Recent Labs  Lab 10/17/23 1801 10/17/23 1838 10/17/23 2300 10/18/23 0439 10/22/23 2220 10/23/23 0743 10/24/23 0126  NA 135 134*  --  138 138 139 141  K 3.5 3.5  --  3.7 3.1* 3.4* 3.2*  CL 98  --   --  103 100 102 107  CO2 14*  --   --  19* 19* 22 21*  GLUCOSE 191*  --   --  112* 160* 105* 110*  BUN 18  --   --  19 26* 23 22  CREATININE 1.39*  --  1.01* 1.07* 1.51* 1.16* 1.07*  CALCIUM  9.2  --   --  8.9 9.2 9.2 8.6*  MG 2.1  --   --  2.1  --  2.0  --   PHOS  --   --   --  3.2  --   --   --     CBC: Recent Labs  Lab 10/17/23 1801 10/17/23 1838 10/17/23 2300 10/18/23 0439 10/22/23 2220 10/23/23 0743 10/24/23 0126  WBC 11.3*  --  15.9* 12.8* 14.3* 12.1* 11.6*  NEUTROABS 8.7*  --   --   --  12.1*  --   --   HGB 12.5   < > 11.4* 11.7* 11.9* 11.6* 10.2*  HCT 39.1   < > 35.4* 36.1 37.3 35.6* 31.6*  MCV 84.8  --  83.9 82.8 83.8 83.0 84.9  PLT 429*  --  391 390 456* 405* 361   < > = values in this interval not displayed.     Coagulation Studies: No results  for input(s): LABPROT, INR in the last 72 hours.  Imaging No new brain imaging overnight    ASSESSMENT AND PLAN: 78 year old female with history of hypertension, hyperlipidemia, chronic kidney disease, anxiety and depression who presented with episodes of confusion/staring.  Of note patient had similar presentation last week and at that time reported consuming about 8 THC Gummies.  Denies any further drug use.  Acute encephalopathy - MRI brain last week did not show any acute abnormality.  However on EEG she has diffuse generalized high amplitude delta slowing which appears rhythmic. -This could be secondary to toxic hypermetabolic etiology.  Her B12 does appear to be on the lower end of normal.  Therefore recommend B12 supplementation for goal B12 over 500 - Will also check ammonia, RPR - Therefore we will obtain MRI brain with and without contrast to look for any acute abnormality. - DC LTM EEG as no definite seizures - Of note on exam, patient does appear restless and because of 2 seizure-like episodes,  for now I will go ahead and start her on oxcarbazepine  150 mg twice daily as it also has mood stabilizing effects.  However if this does not help, this can be discontinued - Neurology will continue to follow - Of note I tried calling patient's husband 3 times this morning but call kept going to voicemail. - Discussed plan with patient as well as Dr. Briana via secure chat     I personally spent a total of 38 minutes in the care of the patient today including getting/reviewing separately obtained history, performing a medically appropriate exam/evaluation, counseling and educating, placing orders, referring and communicating with other health care professionals, documenting clinical information in the EHR, independently interpreting results, and coordinating care.          Arlin Krebs Epilepsy [facility] For questions after 5pm please refer to AMION to reach the  Neurologist on call

## 2023-10-24 NOTE — Plan of Care (Signed)

## 2023-10-24 NOTE — Procedures (Signed)
 Patient Name: [name]  MRN: [id number]  Epilepsy Attending: Arlin MALVA Krebs  Referring Physician/Provider: everitt Clint Abbey Earle FORBES, NP   Duration: 10/23/2023 2042 to 10/24/2023 1228  Patient history: 78 y.o. female with hx of anxiety, depression, hypertension, hyperlipidemia, CKD and chronic insomnia who presents with recurrent episodes of decreased responsiveness and seizure activity yesterday evening. EEG to evaluate for seizure  Level of alertness: Awake, asleep  AEDs during EEG study: None  Technical aspects: This EEG study was done with scalp electrodes positioned according to the 10-20 International system of electrode placement. Electrical activity was reviewed with band pass filter of 1-70Hz , sensitivity of 7 uV/mm, display speed of 45mm/sec with a 60Hz  notched filter applied as appropriate. EEG data were recorded continuously and digitally stored.  Video monitoring was available and reviewed as appropriate.  Description: EEG showed continuous generalized high amplitude sharply contoured rhythmic 3 to 4 Hz delta slowing with overriding 13 to 15 Hz beta activity.  At the beginning of the study, there was also sharp transients in vertex region admixed with delta slowing.  IV Versed  was administered at around 2227 on 10/23/2023.  Subsequently EEG continued to show continuous generalized and lateralized right hemisphere high amplitude sharply contoured rhythmic delta slowing as well as 8-9hz  alpha activity in left hemisphere. Hyperventilation and photic stimulation were not performed.     ABNORMALITY - Continuous rhythmic delta slow, generalized  IMPRESSION: This study is suggestive of moderate to severe diffuse encephalopathy. This EEG pattern is on the ictal-interictal continuum. Additionally there is cortical dysfunction in right hemisphere likely secondary to underlying structural abnormality. No definite seizures  were seen throughout the recording.  Kashara Blocher O Philander Ake

## 2023-10-24 NOTE — Progress Notes (Signed)
 PROGRESS NOTE    [name]  FMW:[id number] DOB: 03-02-1945 DOA: 10/22/2023 PCP: Wendolyn Jenkins Jansky, MD   Brief Narrative: [name] is a 78 y.o. female with a history of insomnia, generalized anxiety, major depressive disorder, hypertension, hyperlipidemia, GERD, CKD stage IIIa.  Patient presented secondary to staring and unresponsive episodes concerning for possible seizure-like activity.  Neurology consulted and LTM EEG initiated.   Assessment and Plan:  Seizure-like activity Patient with change in mental status with episodes of staring; also with episodes of shaking/twitching.  Concern for possible seizure-like activity.  Recent admission for similar presentation.  Neurology consulted on admission and recommended LTM EEG.  No overt seizure noted on EEG, however secondary to repeat admission for seizure-like activity, patient started on Trileptal . -Neurology recommendations: Trileptal , MRI brain, treat vitamin B12, workup for other possible metabolic etiologies -Follow-up ammonia, RVR, HIV  Acute metabolic encephalopathy Unclear etiology.  Patient is on Seroquel , Ambien  and Effexor  which were held on admission.  Per report, patient has also ingested THC Gummies which could be contributory. - Follow-up RPR, ammonia, HIV - Follow-up ongoing neurology workup for seizures  AKI on CKD stage IIIa Baseline creatinine of about 1.1.  Creatinine of 1.51 on admission.  Patient is on triamterene , hydrochlorothiazide , benazepril , which were held.  Creatinine improved to baseline today.  Hypokalemia -Potassium supplementation  Primary hypertension Patient is on amlodipine -benazepril , triamterene -hydrochlorothiazide  as an outpatient.  Benazepril , triamterene , hydrochlorothiazide  held on admission secondary to AKI. -Continue amlodipine   Hyperlipidemia -Continue Lipitor  Low-normal Vitamin B12 Vitamin B12 of 192.  Neurology recommendation treatment. - Vitamin B12  supplementation  GERD -Continue Protonix   Insomnia -Continue melatonin  Generalized anxiety Major depressive disorder Patient is on Seroquel  and Effexor  as an outpatient.    DVT prophylaxis: Lovenox  Code Status:   Code Status: Full Code Family Communication: None at bedside.  Disposition Plan: Discharge pending ongoing neurology recommendations   Consultants:  Neurology  Procedures:  LTM EEG  Antimicrobials: None   Subjective: Patient reports some feeling of dizziness when her eyes are closed otherwise no other concerns today.  Objective: BP 131/82 (BP Location: Left Arm)   Pulse 70   Temp 97.7 F (36.5 C) (Oral)   Resp 20   Ht 5' 2 (1.575 m)   Wt 83.5 kg   SpO2 99%   BMI 33.65 kg/m   Examination:  General exam: Appears calm and comfortable Respiratory system: Clear to auscultation. Respiratory effort normal. Cardiovascular system: S1 & S2 heard, RRR. No murmurs. Gastrointestinal system: Abdomen is nondistended, soft and nontender. Normal bowel sounds heard. Central nervous system: Alert and oriented. Musculoskeletal: No edema. No calf tenderness   Data Reviewed: I have personally reviewed following labs and imaging studies  CBC Lab Results  Component Value Date   WBC 11.6 (H) 10/24/2023   RBC 3.72 (L) 10/24/2023   HGB 10.2 (L) 10/24/2023   HCT 31.6 (L) 10/24/2023   MCV 84.9 10/24/2023   MCH 27.4 10/24/2023   PLT 361 10/24/2023   MCHC 32.3 10/24/2023   RDW 16.4 (H) 10/24/2023   LYMPHSABS 1.0 10/22/2023   MONOABS 1.0 10/22/2023   EOSABS 0.0 10/22/2023   BASOSABS 0.1 10/22/2023     Last metabolic panel Lab Results  Component Value Date   NA 141 10/24/2023   K 3.2 (L) 10/24/2023   CL 107 10/24/2023   CO2 21 (L) 10/24/2023   BUN 22 10/24/2023   CREATININE 1.07 (H) 10/24/2023   GLUCOSE 110 (H) 10/24/2023   GFRNONAA 53 (  L) 10/24/2023   GFRAA 53 (L) 06/30/2020   CALCIUM  8.6 (L) 10/24/2023   PHOS 3.2 10/18/2023   PROT 7.4 10/17/2023    ALBUMIN 3.6 10/17/2023   BILITOT 0.7 10/17/2023   ALKPHOS 121 10/17/2023   AST 52 (H) 10/17/2023   ALT 32 10/17/2023   ANIONGAP 13 10/24/2023    GFR: Estimated Creatinine Clearance: 43.4 mL/min (A) (by C-G formula based on SCr of 1.07 mg/dL (H)).  No results found for this or any previous visit (from the past 240 hours).    Radiology Studies: No results found.    LOS: 0 days    Elgin Lam, MD [facility] 10/24/2023, 12:20 PM   If 7PM-7AM, please contact night-coverage [email or web address]

## 2023-10-24 NOTE — Progress Notes (Signed)
 vLTM discontinued  No skin breakdown noted at all skin sites  [facility] notified

## 2023-10-24 NOTE — Hospital Course (Signed)
 [name] is a 78 y.o. female with a history of insomnia, generalized anxiety, major depressive disorder, hypertension, hyperlipidemia, GERD, CKD stage IIIa.  Patient presented secondary to staring and unresponsive episodes concerning for possible seizure-like activity.  Neurology consulted and LTM EEG initiated.

## 2023-10-25 ENCOUNTER — Inpatient Hospital Stay: Admitting: Family Medicine

## 2023-10-25 ENCOUNTER — Inpatient Hospital Stay (HOSPITAL_COMMUNITY)

## 2023-10-25 DIAGNOSIS — G3184 Mild cognitive impairment, so stated: Secondary | ICD-10-CM

## 2023-10-25 DIAGNOSIS — R410 Disorientation, unspecified: Secondary | ICD-10-CM

## 2023-10-25 DIAGNOSIS — F411 Generalized anxiety disorder: Secondary | ICD-10-CM | POA: Diagnosis not present

## 2023-10-25 DIAGNOSIS — R569 Unspecified convulsions: Secondary | ICD-10-CM | POA: Diagnosis not present

## 2023-10-25 DIAGNOSIS — I10 Essential (primary) hypertension: Secondary | ICD-10-CM | POA: Diagnosis not present

## 2023-10-25 DIAGNOSIS — N179 Acute kidney failure, unspecified: Secondary | ICD-10-CM | POA: Diagnosis not present

## 2023-10-25 LAB — GLUCOSE, CAPILLARY: Glucose-Capillary: 125 mg/dL — ABNORMAL HIGH (ref 70–99)

## 2023-10-25 LAB — SEDIMENTATION RATE: Sed Rate: 56 mm/h — ABNORMAL HIGH (ref 0–22)

## 2023-10-25 LAB — POTASSIUM: Potassium: 3.7 mmol/L (ref 3.5–5.1)

## 2023-10-25 LAB — FERRITIN: Ferritin: 86 ng/mL (ref 11–307)

## 2023-10-25 LAB — C-REACTIVE PROTEIN: CRP: 1.7 mg/dL — ABNORMAL HIGH (ref <1.0)

## 2023-10-25 LAB — RPR: RPR Ser Ql: NONREACTIVE

## 2023-10-25 LAB — CK: Total CK: 80 U/L (ref 38–234)

## 2023-10-25 MED ORDER — LORAZEPAM 2 MG/ML IJ SOLN: 1.0000 mg | Freq: Once | INTRAMUSCULAR | Status: DC

## 2023-10-25 MED ORDER — LORAZEPAM 1 MG PO TABS
1.0000 mg | ORAL_TABLET | ORAL | Status: AC
  Administered 2023-10-25: 1 mg via ORAL
  Filled 2023-10-25: qty 1

## 2023-10-25 MED ORDER — QUETIAPINE FUMARATE 25 MG PO TABS: 25.0000 mg | ORAL_TABLET | Freq: Every evening | ORAL | Status: DC | PRN

## 2023-10-25 MED ORDER — HALOPERIDOL LACTATE 5 MG/ML IJ SOLN: 1.0000 mg | Freq: Four times a day (QID) | INTRAMUSCULAR | Status: DC | PRN

## 2023-10-25 MED ORDER — CLONAZEPAM 0.25 MG PO TBDP
0.2500 mg | ORAL_TABLET | Freq: Once | ORAL | Status: AC
  Administered 2023-10-25: 0.25 mg via ORAL
  Filled 2023-10-25: qty 1

## 2023-10-25 MED ORDER — MIDAZOLAM HCL 2 MG/2ML IJ SOLN: 1.0000 mg | Freq: Once | INTRAMUSCULAR | Status: DC

## 2023-10-25 MED ORDER — LACOSAMIDE 50 MG PO TABS
50.0000 mg | ORAL_TABLET | Freq: Two times a day (BID) | ORAL | Status: DC
  Administered 2023-10-25 – 2023-10-26 (×3): 50 mg via ORAL
  Filled 2023-10-25 (×3): qty 1

## 2023-10-25 MED ORDER — QUETIAPINE FUMARATE 25 MG PO TABS
12.5000 mg | ORAL_TABLET | Freq: Every day | ORAL | Status: DC
  Administered 2023-10-25: 12.5 mg via ORAL
  Filled 2023-10-25: qty 1

## 2023-10-25 NOTE — Plan of Care (Signed)
 RN reported that patient is anxious, agitated and not following command and removed IV access. - Giving oral lorazepam  1 mg, IM Haldol  1 mg as needed for agitation and oral Seroquel  25 mg once. -Implemented bedside sitter for monitor for anxiety, agitation and high risk of fall.  Continue frequent reorientation. Continue delirium precaution.

## 2023-10-25 NOTE — Procedures (Signed)
 Lumbar Puncture Procedure Note Date: 10/25/23 Time: 12:34 PM Indication:  Confusion Proceduralist: D. Remi Supervisor: Yadav  Consent obtained from husband A time-out was completed verifying correct patient, procedure, site, positioning, and special equipment if applicable. The procedure was described to the patient in the presence of RN, husband, APP, and Patient. All the indications and potential side effects of the procedure were discussed in details (including but not limited to risk of bleeding, infection, nerve injury and post LP headache). Patient/HCPOA was understanding, agreeable and all questions were answered.  The patient was placed in the Right Lateral decubitus position in a semi-fetal position with help from the nursing staff. The area was cleansed and draped in usual sterile fashion. 1% lidocaine  was used anesthetize the surrounding skin area. LP was unsuccessful, no CSF obtained.  Fluoro guided LP requested.   Jorene Remi, DNP, FNP-BC [facility] Pager: [phone]

## 2023-10-25 NOTE — Telephone Encounter (Signed)
 Verbal orders given to Christine from [place] home [facility] as requested per pcp.

## 2023-10-25 NOTE — Progress Notes (Signed)
 Transition of Care [facility]) - Inpatient Brief Assessment   Patient Details  Name: [name] MRN: [id number] Date of Birth: 05/17/1945  Transition of Care [facility]) CM/SW Contact:    [name], RN Phone Number: 10/25/2023, 2:07 PM   Clinical Narrative: Patient admitted to the hospital from home with Seizures.  Patient lives with spouse at home.  DME at the home includes [facility].  Patient was offered choice regarding home  health and spouse states that patient is already active with [facility].  I called [name], RNCM with [facility] and he is aware that patient will need continued services.  HH for Pt/OT placed to be co-signed by MD.  No other IP Care management  needs.  Patient should return home with spouse when stable.   Transition of Care Asessment: Insurance and Status: (P) Insurance coverage has been reviewed Patient has primary care physician: (P) Yes Home environment has been reviewed: (P) from home with spouse Prior level of function:: (P) self Prior/Current Home Services: (P) Current home services (Active with [place]) Social Drivers of Health Review: (P) SDOH reviewed interventions complete Readmission risk has been reviewed: (P) Yes Transition of care needs: (P) transition of care needs identified, TOC will continue to follow

## 2023-10-25 NOTE — Progress Notes (Addendum)
 Subjective: Was agitated overnight.  Per husband, she has had some occasional issues with memory but has been very independent and high functioning.  However in the last 1 week, she has been very confused and at times staring off.  She also had some diarrhea yesterday  ROS: negative except above  Examination  Vital signs in last 24 hours: Temp:  [97.6 F (36.4 C)-98.3 F (36.8 C)] 98.3 F (36.8 C) (09/11 1135) Pulse Rate:  [65-80] 76 (09/11 1135) Resp:  [18-19] 18 (09/11 1135) BP: (99-160)/(59-89) 160/59 (09/11 1135) SpO2:  [97 %-100 %] 98 % (09/11 1135)  General: lying in bed, NAD Neuro: MS: Alert, oriented, follows commands CN: pupils equal and reactive,  EOMI, face symmetric, tongue midline, normal sensation over face, Motor: 5/5 strength in all 4 extremities Coordination: normal Gait: not tested  Basic Metabolic Panel: Recent Labs  Lab 10/22/23 2220 10/23/23 0743 10/24/23 0126 10/25/23 0510  NA 138 139 141  --   K 3.1* 3.4* 3.2* 3.7  CL 100 102 107  --   CO2 19* 22 21*  --   GLUCOSE 160* 105* 110*  --   BUN 26* 23 22  --   CREATININE 1.51* 1.16* 1.07*  --   CALCIUM  9.2 9.2 8.6*  --   MG  --  2.0 1.7  --     CBC: Recent Labs  Lab 10/22/23 2220 10/23/23 0743 10/24/23 0126  WBC 14.3* 12.1* 11.6*  NEUTROABS 12.1*  --   --   HGB 11.9* 11.6* 10.2*  HCT 37.3 35.6* 31.6*  MCV 83.8 83.0 84.9  PLT 456* 405* 361     Coagulation Studies: No results for input(s): LABPROT, INR in the last 72 hours.  Imaging personally reviewed  MRI brain with and without contrast 10/23/2023: No acute abnormality   ASSESSMENT AND PLAN:78 year old female with history of hypertension, hyperlipidemia, chronic kidney disease, anxiety and depression who presented with episodes of confusion/staring.  Of note patient had similar presentation last week and at that time reported consuming about 8 THC Gummies.  Denies any further drug use.   Acute encephalopathy - MRI brain did not  show any acute abnormality.  However on EEG she has diffuse generalized high amplitude delta slowing which appears rhythmic. -This could be secondary to toxic -metabolic etiology, however her ammonia is not elevated enough to explain the intermittent confusion and she has not used any more THC Gummies so it is unlikely that that still contributing.  Husband did report some episodes of forgetfulness for a while.  Therefore it is possible that in the setting of baseline mild neurocognitive impairment THC is now causing longer-lasting issues and will gradually improve. Another differential includes autoimmune encephalitis given the clinical presentation as well as EEG -Attempted bedside lumbar puncture which did not appear to order lumbar puncture scheduled for 2 PM today.  Labs ordered -Depending on results we will consider empiric Solu-Medrol  -Oxcarb was discontinued as patient's husband was concerned that it might be contributing to diarrhea.  Switch to Vimpat  50 mg twice daily instead.  EKG reviewed - Discussed plan with patient, husband at bedside, daughter-in-law on phone as well as Dr. Briana via secure chat       I personally spent a total of 45 minutes in the care of the patient today including getting/reviewing separately obtained history, performing a medically appropriate exam/evaluation, counseling and educating, placing orders, referring and communicating with other health care professionals, documenting clinical information in the EHR, independently interpreting results, and  coordinating care.        Arlin Krebs Epilepsy [facility] For questions after 5pm please refer to AMION to reach the Neurologist on call

## 2023-10-25 NOTE — Plan of Care (Signed)

## 2023-10-25 NOTE — Progress Notes (Addendum)
 PROGRESS NOTE    [name]  FMW:[id number] DOB: 21-Feb-1945 DOA: 10/22/2023 PCP: Wendolyn Jenkins Jansky, MD   Brief Narrative: [name] is a 78 y.o. female with a history of insomnia, generalized anxiety, major depressive disorder, hypertension, hyperlipidemia, GERD, CKD stage IIIa.  Patient presented secondary to staring and unresponsive episodes concerning for possible seizure-like activity.  Neurology consulted and LTM EEG initiated.   Assessment and Plan:  Seizure-like activity Patient with change in mental status with episodes of staring; also with episodes of shaking/twitching.  Concern for possible seizure-like activity.  Recent admission for similar presentation.  Neurology consulted on admission and recommended LTM EEG.  No overt seizure noted on EEG, however secondary to repeat admission for seizure-like activity, patient started on Trileptal  and transitioned to Vimpat . MRI without acute process; significant mild cerebral and mild-moderate cerebellar atrophy. -Neurology recommendations: Trileptal  transitioned to Vimpat , treat vitamin B12, workup for other possible metabolic etiologies, LP  Acute metabolic encephalopathy Unclear etiology.  Patient is on Seroquel , Ambien  and Effexor  which were held on admission.  Per report, patient has also ingested THC gummies which could be contributory. RPR and  HIV non-reactive. Ammonia slightly elevated. -Neurology recommendations: Plan for LP today  AKI on CKD stage IIIa Baseline creatinine of about 1.1.  Creatinine of 1.51 on admission.  Patient is on triamterene , hydrochlorothiazide , benazepril , which were held.  Creatinine improved to baseline.  Hypokalemia Potassium supplementation given. Resolved.  Primary hypertension Patient is on amlodipine -benazepril , triamterene -hydrochlorothiazide  as an outpatient.  Benazepril , triamterene , hydrochlorothiazide  held on admission secondary to AKI. -Continue  amlodipine   Hyperlipidemia -Continue Lipitor  Low-normal Vitamin B12 Vitamin B12 of 192.  Neurology recommendation treatment. -Vitamin B12 supplementation  GERD -Continue Protonix   Insomnia -Continue melatonin  Generalized anxiety Major depressive disorder Patient is on Seroquel  and Effexor  as an outpatient.   DVT prophylaxis: Lovenox  Code Status:   Code Status: Full Code Family Communication: None at bedside. Husband on telephone Disposition Plan: Discharge pending ongoing neurology recommendations   Consultants:  Neurology  Procedures:  LTM EEG  Antimicrobials: None   Subjective: Patient reports no issues this morning. Feels fine.  Objective: BP 99/74 (BP Location: Left Arm)   Pulse 74   Temp 97.8 F (36.6 C) (Oral)   Resp 18   Ht 5' 2 (1.575 m)   Wt 83.5 kg   SpO2 98%   BMI 33.65 kg/m   Examination:  General exam: Appears calm and comfortable Respiratory system: Clear to auscultation. Respiratory effort normal. Cardiovascular system: S1 & S2 heard, RRR. Gastrointestinal system: Abdomen is nondistended, soft and nontender. Normal bowel sounds heard. Central nervous system: Alert and oriented to self and place. Musculoskeletal: No edema. No calf tenderness   Data Reviewed: I have personally reviewed following labs and imaging studies  CBC Lab Results  Component Value Date   WBC 11.6 (H) 10/24/2023   RBC 3.72 (L) 10/24/2023   HGB 10.2 (L) 10/24/2023   HCT 31.6 (L) 10/24/2023   MCV 84.9 10/24/2023   MCH 27.4 10/24/2023   PLT 361 10/24/2023   MCHC 32.3 10/24/2023   RDW 16.4 (H) 10/24/2023   LYMPHSABS 1.0 10/22/2023   MONOABS 1.0 10/22/2023   EOSABS 0.0 10/22/2023   BASOSABS 0.1 10/22/2023     Last metabolic panel Lab Results  Component Value Date   NA 141 10/24/2023   K 3.7 10/25/2023   CL 107 10/24/2023   CO2 21 (L) 10/24/2023   BUN 22 10/24/2023   CREATININE 1.07 (H) 10/24/2023  GLUCOSE 110 (H) 10/24/2023   GFRNONAA 53 (L)  10/24/2023   GFRAA 53 (L) 06/30/2020   CALCIUM  8.6 (L) 10/24/2023   PHOS 3.2 10/18/2023   PROT 7.4 10/17/2023   ALBUMIN 3.6 10/17/2023   BILITOT 0.7 10/17/2023   ALKPHOS 121 10/17/2023   AST 52 (H) 10/17/2023   ALT 32 10/17/2023   ANIONGAP 13 10/24/2023    GFR: Estimated Creatinine Clearance: 43.4 mL/min (A) (by C-G formula based on SCr of 1.07 mg/dL (H)).  No results found for this or any previous visit (from the past 240 hours).    Radiology Studies: MR BRAIN W WO CONTRAST Result Date: 10/24/2023 CLINICAL DATA:  Provided history: Seizure disorder, clinical change. EXAM: MRI HEAD WITHOUT AND WITH CONTRAST TECHNIQUE: Multiplanar, multiecho pulse sequences of the brain and surrounding structures were obtained without and with intravenous contrast. CONTRAST:  8mL GADAVIST  GADOBUTROL  1 MMOL/ML IV SOLN COMPARISON:  Brain MRI 10/17/2023. FINDINGS: Brain: Mild generalized cerebral atrophy. Mild-to-moderate cerebellar atrophy. Commensurate prominence of the ventricles and sulci. Minor chronic small vessel ischemic changes within the pons. No cortical encephalomalacia is identified. No significant cerebral white matter disease for age. No appreciable hippocampal size or signal asymmetry. There is no acute infarct. No evidence of an intracranial mass. No chronic intracranial blood products. No extra-axial fluid collection. No midline shift. No pathologic intracranial enhancement identified. Vascular: Maintained flow voids within the proximal large arterial vessels. Skull and upper cervical spine: No focal worrisome marrow lesion. Multilevel facet arthropathy within the cervical spine at the visible levels. Sinuses/Orbits: No mass or acute finding within the imaged orbits. No significant paranasal sinus disease. IMPRESSION: 1. No evidence of an acute intracranial abnormality. 2. No specific seizure etiology is identified. 3. Minor chronic small vessel ischemic changes within the pons. 4. Mild cerebral  atrophy. 5. Mild-to-moderate cerebellar atrophy. Electronically Signed   By: Rockey Childs D.O.   On: 10/24/2023 15:52      LOS: 1 day    Elgin Lam, MD [facility] 10/25/2023, 11:03 AM   If 7PM-7AM, please contact night-coverage [email or web address]

## 2023-10-25 NOTE — Evaluation (Signed)
 Physical Therapy Evaluation Patient Details Name: [name] MRN: [id number] DOB: [birth year] Today's Date: 10/25/2023  History of Present Illness  Pt is a 78 y.o. F who presents 10/22/2023 with staring and unresponsive episodes concerning for possible seizure like activity. Recent admission for similar presentation. MRI with no evidence of acute intracranial abnormality. PMH positive for HTN, GERD, MDD, GAD, insomina.  Clinical Impression  PTA, pt lives with her spouse and is typically independent with ADL's and mobility; pt recently using RW since prior admission. Pt presents with impaired cognition, dynamic balance, BUE coordination, and decreased endurance. Pt ambulating 20 ft x 2 with a RW and seated rest break in between bouts. SpO2 97% on RA. Returned pt to bed due to pending LP. Recommend HHPT at d/c.        If plan is discharge home, recommend the following: A little help with walking and/or transfers;A little help with bathing/dressing/bathroom;Assistance with cooking/housework;Assist for transportation;Help with stairs or ramp for entrance;Supervision due to cognitive status   Can travel by private vehicle        Equipment Recommendations None recommended by PT  Recommendations for Other Services       Functional Status Assessment Patient has had a recent decline in their functional status and demonstrates the ability to make significant improvements in function in a reasonable and predictable amount of time.     Precautions / Restrictions Precautions Precautions: Fall Restrictions Weight Bearing Restrictions Per Provider Order: No      Mobility  Bed Mobility Overal bed mobility: Modified Independent                  Transfers Overall transfer level: Needs assistance Equipment used: Rolling walker (2 wheels) Transfers: Sit to/from Stand Sit to Stand: Contact guard assist                Ambulation/Gait Ambulation/Gait assistance: Contact  guard assist Gait Distance (Feet): 20 Feet (20, 20) Assistive device: Rolling walker (2 wheels) Gait Pattern/deviations: Step-through pattern, Decreased stride length Gait velocity: decreased Gait velocity interpretation: <1.8 ft/sec, indicate of risk for recurrent falls   General Gait Details: Slightly tremulous, CGA for safety, seated rest break in between bouts  Stairs            Wheelchair Mobility     Tilt Bed    Modified Rankin (Stroke Patients Only)       Balance Overall balance assessment: Needs assistance Sitting-balance support: Feet supported Sitting balance-Leahy Scale: Fair Sitting balance - Comments: Able to don socks edge of bed with supervision   Standing balance support: Bilateral upper extremity supported, Reliant on assistive device for balance Standing balance-Leahy Scale: Poor                               Pertinent Vitals/Pain Pain Assessment Pain Assessment: Faces Faces Pain Scale: Hurts little more Pain Location: low back Pain Descriptors / Indicators: Sore Pain Intervention(s): Monitored during session    Home Living Family/patient expects to be discharged to:: Private residence Living Arrangements: Spouse/significant other Available Help at Discharge: Family;Available 24 hours/day Type of Home: House Home Access: Stairs to enter Entrance Stairs-Rails: Right Entrance Stairs-Number of Steps: 2   Home Layout: One level Home Equipment: Agricultural consultant (2 wheels);Cane - single point;Grab bars - tub/shower      Prior Function Prior Level of Function : Independent/Modified Independent  Mobility Comments: Recently using RW since last admission, but otherwise typically completely independent       Extremity/Trunk Assessment   Upper Extremity Assessment Upper Extremity Assessment: Defer to OT evaluation;RUE deficits/detail;LUE deficits/detail RUE Coordination: decreased gross motor LUE Coordination:  decreased gross motor    Lower Extremity Assessment Lower Extremity Assessment: Overall WFL for tasks assessed    Cervical / Trunk Assessment Cervical / Trunk Assessment: Kyphotic  Communication   Communication Communication: Impaired Factors Affecting Communication: Hearing impaired    Cognition Arousal: Alert Behavior During Therapy: Anxious   PT - Cognitive impairments: Problem solving, Safety/Judgement, Memory, Awareness                       PT - Cognition Comments: Pt reports feeling foggy, and confused,' now A&Ox4, at times has difficulty following 1-2 step commands Following commands: Impaired Following commands impaired: Follows one step commands inconsistently     Cueing Cueing Techniques: Verbal cues     General Comments      Exercises     Assessment/Plan    PT Assessment Patient needs continued PT services  PT Problem List Decreased strength;Decreased activity tolerance;Decreased balance;Decreased mobility;Decreased cognition;Decreased coordination       PT Treatment Interventions DME instruction;Gait training;Stair training;Therapeutic activities;Functional mobility training;Therapeutic exercise;Balance training;Patient/family education    PT Goals (Current goals can be found in the Care Plan section)  Acute Rehab PT Goals Patient Stated Goal: to figure out what is causing episodes PT Goal Formulation: With patient/family Time For Goal Achievement: 11/08/23 Potential to Achieve Goals: Good    Frequency Min 2X/week     Co-evaluation               AM-PAC PT 6 Clicks Mobility  Outcome Measure Help needed turning from your back to your side while in a flat bed without using bedrails?: None Help needed moving from lying on your back to sitting on the side of a flat bed without using bedrails?: None Help needed moving to and from a bed to a chair (including a wheelchair)?: A Little Help needed standing up from a chair using your  arms (e.g., wheelchair or bedside chair)?: A Little Help needed to walk in hospital room?: A Little Help needed climbing 3-5 steps with a railing? : A Lot 6 Click Score: 19    End of Session Equipment Utilized During Treatment: Gait belt Activity Tolerance: Patient tolerated treatment well Patient left: in bed;with call bell/phone within reach;with bed alarm set;with family/visitor present;Other (comment) (pending LP) Nurse Communication: Mobility status PT Visit Diagnosis: Unsteadiness on feet (R26.81);Difficulty in walking, not elsewhere classified (R26.2)    Time: 8869-8848 PT Time Calculation (min) (ACUTE ONLY): 21 min   Charges:   PT Evaluation $PT Eval Low Complexity: 1 Low   PT General Charges $$ ACUTE PT VISIT: 1 Visit         Aleck Daring, PT, DPT Acute Rehabilitation Services Office [phone]   Aleck ONEIDA Daring 10/25/2023, 12:15 PM

## 2023-10-25 NOTE — Plan of Care (Signed)
 Due to confusion patient is declining telemonitoring.  Informed nurse to hold the telemetry and will try to replace back it in the morning once patient will be more alert oriented and allows.

## 2023-10-26 ENCOUNTER — Other Ambulatory Visit (HOSPITAL_COMMUNITY): Payer: Self-pay

## 2023-10-26 ENCOUNTER — Inpatient Hospital Stay (HOSPITAL_COMMUNITY)

## 2023-10-26 LAB — CBC
HCT: 36.1 % (ref 36.0–46.0)
Hemoglobin: 11.6 g/dL — ABNORMAL LOW (ref 12.0–15.0)
MCH: 26.7 pg (ref 26.0–34.0)
MCHC: 32.1 g/dL (ref 30.0–36.0)
MCV: 83.2 fL (ref 80.0–100.0)
Platelets: 407 K/uL — ABNORMAL HIGH (ref 150–400)
RBC: 4.34 MIL/uL (ref 3.87–5.11)
RDW: 16.1 % — ABNORMAL HIGH (ref 11.5–15.5)
WBC: 9.4 K/uL (ref 4.0–10.5)
nRBC: 0 % (ref 0.0–0.2)

## 2023-10-26 LAB — BASIC METABOLIC PANEL WITH GFR
Anion gap: 15 (ref 5–15)
BUN: 30 mg/dL — ABNORMAL HIGH (ref 8–23)
CO2: 16 mmol/L — ABNORMAL LOW (ref 22–32)
Calcium: 8.5 mg/dL — ABNORMAL LOW (ref 8.9–10.3)
Chloride: 111 mmol/L (ref 98–111)
Creatinine, Ser: 0.95 mg/dL (ref 0.44–1.00)
GFR, Estimated: >60 mL/min (ref >60)
Glucose, Bld: 103 mg/dL — ABNORMAL HIGH (ref 70–99)
Potassium: 4.1 mmol/L (ref 3.5–5.1)
Sodium: 142 mmol/L (ref 135–145)

## 2023-10-26 LAB — C3 COMPLEMENT: C3 Complement: 215 mg/dL — ABNORMAL HIGH (ref 82–167)

## 2023-10-26 LAB — FANA STAINING PATTERNS
Nuclear Membrane Pattern: 1112
Speckled Pattern: 24529 — ABNORMAL HIGH

## 2023-10-26 LAB — C4 COMPLEMENT: Complement C4, Body Fluid: 54 mg/dL — ABNORMAL HIGH (ref 12–38)

## 2023-10-26 LAB — ANTINUCLEAR ANTIBODIES, IFA: ANA Ab, IFA: POSITIVE — AB

## 2023-10-26 LAB — ALDOLASE: Aldolase: 9.1 U/L (ref 3.3–10.3)

## 2023-10-26 LAB — RHEUMATOID FACTOR: Rheumatoid fact SerPl-aCnc: 10 [IU]/mL (ref <14.0)

## 2023-10-26 LAB — ANTI-SCLERODERMA ANTIBODY: Scleroderma (Scl-70) (ENA) Antibody, IgG: <0.2 AI (ref 0.0–0.9)

## 2023-10-26 LAB — CYCLIC CITRUL PEPTIDE ANTIBODY, IGG/IGA: CCP Antibodies IgG/IgA: 9 U (ref 0–19)

## 2023-10-26 MED ORDER — LACOSAMIDE 50 MG PO TABS
50.0000 mg | ORAL_TABLET | Freq: Two times a day (BID) | ORAL | 0 refills | Status: DC
  Filled 2023-10-26: qty 60, 30d supply, fill #0

## 2023-10-26 MED ORDER — SODIUM CHLORIDE 0.9 % IV BOLUS
500.0000 mL | Freq: Once | INTRAVENOUS | Status: AC
  Administered 2023-10-26: 500 mL via INTRAVENOUS

## 2023-10-26 MED ORDER — CYANOCOBALAMIN 1000 MCG PO TABS
500.0000 ug | ORAL_TABLET | Freq: Every day | ORAL | 0 refills | Status: DC
  Filled 2023-10-26: qty 45, 90d supply, fill #0

## 2023-10-26 MED ORDER — CLONAZEPAM 0.25 MG PO TBDP: 0.2500 mg | ORAL_TABLET | Freq: Every evening | ORAL | Status: DC | PRN

## 2023-10-26 MED ORDER — CLONAZEPAM 0.25 MG PO TBDP
0.2500 mg | ORAL_TABLET | Freq: Every evening | ORAL | 0 refills | Status: DC | PRN
  Filled 2023-10-26: qty 10, 10d supply, fill #0

## 2023-10-26 NOTE — Progress Notes (Signed)
 Routine EEG completed, results pending Neurology review and interpretation

## 2023-10-26 NOTE — Plan of Care (Signed)

## 2023-10-26 NOTE — Progress Notes (Signed)
 Mobility Specialist: Progress Note   10/26/23 1453  Mobility  Activity Ambulated with assistance  Level of Assistance Contact guard assist, steadying assist  Assistive Device Front wheel walker  Distance Ambulated (ft) 200 ft  Activity Response Tolerated well  Mobility Referral Yes  Mobility visit 1 Mobility  Mobility Specialist Start Time (ACUTE ONLY) 1043  Mobility Specialist Stop Time (ACUTE ONLY) 1102  Mobility Specialist Time Calculation (min) (ACUTE ONLY) 19 min    Pt received in bed, agreeable to mobility session. SV for bed mobility. CGA for ambulation. C/o feeling SOB, denies any dizziness or lightheadedness. SpO2 91% on RA. Returned to room without fault. Left in chair getting washed up by her NT, all needs met.   Ileana Lute Mobility Specialist Please contact via SecureChat or Rehab office at [phone]

## 2023-10-26 NOTE — Care Management Important Message (Signed)
 Important Message  Patient Details  Name: [name] MRN: [id number] Date of Birth: 30-Mar-1945   Important Message Given:  Yes - Medicare IM     Claretta Deed 10/26/2023, 4:23 PM

## 2023-10-26 NOTE — Discharge Instructions (Signed)
 [name],  You were in the hospital with confusion. It seems that you were having seizures. You have improved with anti-seizure medications. You also took THC gummies. I recommend no longer partaking in illicit drugs, especially now that you are on new medications. Please follow-up with your PCP and the neurologist.   Per [place] statutes, patients with seizures are not allowed to drive until  they have been seizure-free for six months. Use caution when using heavy equipment or power tools. Avoid working on ladders or at heights. Take showers instead of baths. Ensure the water temperature is not too high on the home water heater. Do not go swimming alone. When caring for infants or small children, sit down when holding, feeding, or changing them to minimize risk of injury to the child in the event you have a seizure.    Also, Maintain good sleep hygiene. Avoid alcohol.   --> Call 911 and bring the patient back to the ED if:               A.  The seizure lasts longer than 5 minutes.                  B.  The patient doesn't awaken shortly after the seizure             C.  The patient has new problems such as difficulty seeing, speaking or moving             D.  The patient was injured during the seizure             E.  The patient has a temperature over 102 F (39C)             F.  The patient vomited and now is having trouble breathing

## 2023-10-26 NOTE — Discharge Summary (Incomplete)
 Physician Discharge Summary   Patient: [name] MRN: [id number] DOB: 1945-05-08  Admit date:     10/22/2023  Discharge date: 10/26/23  Discharge Physician: Elgin Lam, MD   PCP: Wendolyn Jenkins Jansky, MD   Recommendations at discharge:  PCP visit for hospital follow-up Neurology visit for hospital follow-up  Discharge Diagnoses: Principal Problem:   Seizure [facility]) Active Problems:   Essential hypertension   Hyperlipidemia   Prediabetes   Primary insomnia   MDD (recurrent major depressive disorder) in remission (HCC)   Generalized anxiety disorder   Stage 3 chronic kidney disease (HCC)   Acute metabolic encephalopathy   AKI (acute kidney injury) (HCC)   Hypokalemia  Resolved Problems:   * No resolved hospital problems. *  Hospital Course: [name] is a 78 y.o. female with a history of insomnia, generalized anxiety, major depressive disorder, hypertension, hyperlipidemia, GERD, CKD stage IIIa.  Patient presented secondary to staring and unresponsive episodes concerning for possible seizure-like activity.  Neurology consulted and LTM EEG initiated without definite seizures, however significant for EEG pattern on the ictal-interictal continuum. Patient improved with time, but also improved with antiepileptic regimen. MRI brain without abnormality to explain etiology for presentation. LP attempted, but deferred secondary to multiple unsuccessful attempts. Patient to follow-up with neurology as an outpatient.  Assessment and Plan:  Seizure-like activity Patient with change in mental status with episodes of staring; also with episodes of shaking/twitching.  Concern for possible seizure-like activity.  Recent admission for similar presentation.  Neurology consulted on admission and recommended LTM EEG.  No overt seizure noted on EEG, however secondary to repeat admission for seizure-like activity, patient started on Trileptal  and transitioned to Vimpat . MRI without  acute process; significant mild cerebral and mild-moderate cerebellar atrophy. LP attempted but failed at bedside and under fluoroscopy. Repeat EEG within normal limits. LP deferred. Patient discharged on Vimpat  and clonazepam . Patient to follow-up with neurology as an outpatient.   Acute metabolic encephalopathy Unclear etiology.  Patient is on Seroquel , Ambien  and Effexor  which were held on admission.  Per report, patient has also ingested THC gummies which could be contributory. RPR and  HIV non-reactive. Ammonia slightly elevated. Encephalopathy improved prior to discharge.   AKI on CKD stage IIIa Metabolic acidosis Baseline creatinine of about 1.1.  Creatinine of 1.51 on admission.  Patient is on triamterene , hydrochlorothiazide , benazepril , which were held.  Creatinine improved to baseline.   Hypokalemia Potassium supplementation given. Resolved.   Primary hypertension Patient is on amlodipine -benazepril , triamterene -hydrochlorothiazide  as an outpatient.  Benazepril , triamterene , hydrochlorothiazide  held on admission secondary to AKI. Continue outpatient regimen on discharge  Hyperlipidemia Continue Lipitor   Low-normal Vitamin B12 Vitamin B12 of 192.  Neurology recommendation treatment. Vitamin B12 supplementation started and continued on discharge.   GERD Continue Prilosec   Insomnia Continue melatonin.   Generalized anxiety Major depressive disorder Patient is on Seroquel  and Effexor  as an outpatient.  Obesity, class I Estimated body mass index is 33.65 kg/m as calculated from the following:   Height as of this encounter: 5' 2 (1.575 m).   Weight as of this encounter: 83.5 kg.   Consultants: Neurology Procedures performed: EEG  Disposition: Home Diet recommendation: Regular diet   DISCHARGE MEDICATION: Allergies as of 10/26/2023       Reactions   Penicillins Hives   Tolerates Cephalosporins   Prednisone  Other (See Comments)   Hyperactivity Sleep  disturbance Confusion   Sulfa Antibiotics Hives           Medication  List     TAKE these medications    amLODipine -benazepril  5-20 MG capsule Commonly known as: LOTREL  Take 1 capsule by mouth daily.   atorvastatin  20 MG tablet Commonly known as: LIPITOR Take 1 tablet (20 mg total) by mouth daily. What changed: when to take this   clonazePAM  0.25 MG disintegrating tablet Commonly known as: KLONOPIN  Take 1 tablet (0.25 mg total) by mouth at bedtime as needed for up to 10 doses (insomnia, anxiety, seizure). For Seizure ONLY, can take 4 tablets for seizure lasting more than 2 minutes   cyanocobalamin  500 MCG tablet Commonly known as: VITAMIN B12 Take 1 tablet (500 mcg total) by mouth daily. Start taking on: October 27, 2023   lacosamide  50 MG Tabs tablet Commonly known as: VIMPAT  Take 1 tablet (50 mg total) by mouth 2 (two) times daily.   Multivitamin Women 50+ Tabs Take 1 tablet by mouth daily.   omeprazole  40 MG capsule Commonly known as: PRILOSEC Take 1 capsule (40 mg total) by mouth daily.   OVER THE COUNTER MEDICATION Take 1 capsule by mouth every 12 (twelve) hours as needed (headache, cold/flu-like symptoms). OTC cold capsules   QUEtiapine  25 MG tablet Commonly known as: SEROquel  Take 0.5-1 tablets (12.5-25 mg total) by mouth at bedtime. Start at 1/2 tablet and increase to full tablet if needed/tolerated. What changed:  how much to take additional instructions   raloxifene  60 MG tablet Commonly known as: EVISTA  Take 1 tablet (60 mg total) by mouth daily.   triamterene -hydrochlorothiazide  37.5-25 MG capsule Commonly known as: DYAZIDE  Take 1 capsule by mouth daily.   venlafaxine  XR 150 MG 24 hr capsule Commonly known as: EFFEXOR -XR Take 1 capsule (150 mg total) by mouth daily with breakfast. What changed: when to take this   zolpidem  5 MG tablet Commonly known as: AMBIEN  Take 1.5 tablets (7.5 mg total) by mouth at bedtime as needed for sleep.         Follow-up Information     [facility], [facility] Follow up.   Specialty: Home Health Services Why: [facility] will continue to provide home health services.  they will call you when you are discharged home to re-start services. Contact information: [address] [place] [address] [phone]         Wendolyn Jenkins Jansky, MD. Schedule an appointment as soon as possible for a visit in 1 week(s).   Specialty: Family Medicine Why: For hospital follow-up Contact information: [address] [place] [address] [phone]         [facility]. Schedule an appointment as soon as possible for a visit.   Specialty: Neurology Why: For hospital follow-up Contact information: [address] [place]  [address] [phone]               Discharge Exam: BP (!) 159/57 (BP Location: Left Arm)   Pulse (!) 103   Temp 99.4 F (37.4 C) (Oral)   Resp 18   Ht 5' 2 (1.575 m)   Wt 83.5 kg   SpO2 99%   BMI 33.65 kg/m   General exam: Appears calm and comfortable Respiratory system: Clear to auscultation. Respiratory effort normal. Cardiovascular system: S1 & S2 heard, RRR. Gastrointestinal system: Abdomen is nondistended, soft and nontender. Normal bowel sounds heard. Central nervous system: Alert and oriented. Musculoskeletal: No edema. No calf tenderness Skin: No cyanosis. No rashes   Condition at discharge: stable  The results of significant diagnostics  from this hospitalization (including imaging, microbiology, ancillary and laboratory) are listed below for reference.   Imaging Studies: EEG adult Result Date: 10/26/2023 Shelton Arlin KIDD, MD     10/26/2023  3:04 PM Patient Name: [name] MRN: [id number] Epilepsy Attending: Arlin KIDD Shelton Referring Physician/Provider: Arlin Shelton, MD Date: 10/26/2023 Duration: 33.12 mins  Patient history: 78 y.o. female past  history of anxiety, depression, hypertension, hyperlipidemia, insomnia who presented for evaluation of altered mental status after ingestion of THC/delta 8 gummy yesterday, then had a witnessed tonic-clonic seizure lasting about 1 minute in the ED. EEG to evaluate for seizure  Level of alertness: Awake  AEDs during EEG study: None  Technical aspects: This EEG study was done with scalp electrodes positioned according to the 10-20 International system of electrode placement. Electrical activity was reviewed with band pass filter of 1-70Hz , sensitivity of 7 uV/mm, display speed of 2mm/sec with a 60Hz  notched filter applied as appropriate. EEG data were recorded continuously and digitally stored.  Video monitoring was available and reviewed as appropriate.  Description: The posterior dominant rhythm consists of 8-9 Hz activity of moderate voltage (25-35 uV) seen predominantly in posterior head regions, symmetric and reactive to eye opening and eye closing. Hyperventilation and photic stimulation were not performed.    IMPRESSION: This study is within normal limits. No seizures or epileptiform discharges were seen throughout the recording.  A normal interictal EEG does not exclude the diagnosis of epilepsy.   Priyanka KIDD Shelton    DG FL GUIDED LUMBAR PUNCTURE Result Date: 10/25/2023 CLINICAL DATA:  Confusion, AMS, worsening agitation x1 week. Consult for fluoroscopic guided lumbar puncture for further diagnostic evaluation. EXAM: DIAGNOSTIC LUMBAR PUNCTURE UNDER FLUOROSCOPIC GUIDANCE COMPARISON:  None Available. FLUOROSCOPY: Radiation Exposure Index (as provided by the fluoroscopic device): 89.0 mGy Kerma PROCEDURE: Informed consent was obtained from the patient prior to the procedure, including potential complications of headache, allergy, and pain. With the patient prone, the lower back was prepped with Betadine . 1% Lidocaine  was used for local anesthesia. Lumbar puncture was attempted at L5-S1, L3-L4, and L2-L3  levels using a 3.5 inch 20 gauge needle. There was small amount of blood return in the hub at each level without return of CSF. The patient tolerated the procedure well and there were no apparent complications. IMPRESSION: Unsuccessful fluoroscopic guided lumbar puncture. Performed by: Wyatt Pommier, PA-C Electronically Signed   By: Ester Sides M.D.   On: 10/25/2023 17:17   MR BRAIN W WO CONTRAST Result Date: 10/24/2023 CLINICAL DATA:  Provided history: Seizure disorder, clinical change. EXAM: MRI HEAD WITHOUT AND WITH CONTRAST TECHNIQUE: Multiplanar, multiecho pulse sequences of the brain and surrounding structures were obtained without and with intravenous contrast. CONTRAST:  8mL GADAVIST  GADOBUTROL  1 MMOL/ML IV SOLN COMPARISON:  Brain MRI 10/17/2023. FINDINGS: Brain: Mild generalized cerebral atrophy. Mild-to-moderate cerebellar atrophy. Commensurate prominence of the ventricles and sulci. Minor chronic small vessel ischemic changes within the pons. No cortical encephalomalacia is identified. No significant cerebral white matter disease for age. No appreciable hippocampal size or signal asymmetry. There is no acute infarct. No evidence of an intracranial mass. No chronic intracranial blood products. No extra-axial fluid collection. No midline shift. No pathologic intracranial enhancement identified. Vascular: Maintained flow voids within the proximal large arterial vessels. Skull and upper cervical spine: No focal worrisome marrow lesion. Multilevel facet arthropathy within the cervical spine at the visible levels. Sinuses/Orbits: No mass or acute finding within the imaged orbits. No significant paranasal sinus disease. IMPRESSION: 1. No evidence  of an acute intracranial abnormality. 2. No specific seizure etiology is identified. 3. Minor chronic small vessel ischemic changes within the pons. 4. Mild cerebral atrophy. 5. Mild-to-moderate cerebellar atrophy. Electronically Signed   By: Rockey Childs D.O.    On: 10/24/2023 15:52   Overnight EEG with video Result Date: 10/24/2023 Shelton Arlin KIDD, MD     10/25/2023  4:16 PM Patient Name: [name] MRN: [id number] Epilepsy Attending: Arlin KIDD Shelton Referring Physician/Provider: everitt Clint Abbey Earle FORBES, NP  Duration: 10/23/2023 2042 to 10/24/2023 1228 Patient history: 78 y.o. female with hx of anxiety, depression, hypertension, hyperlipidemia, CKD and chronic insomnia who presents with recurrent episodes of decreased responsiveness and seizure activity yesterday evening. EEG to evaluate for seizure Level of alertness: Awake, asleep AEDs during EEG study: None Technical aspects: This EEG study was done with scalp electrodes positioned according to the 10-20 International system of electrode placement. Electrical activity was reviewed with band pass filter of 1-70Hz , sensitivity of 7 uV/mm, display speed of 82mm/sec with a 60Hz  notched filter applied as appropriate. EEG data were recorded continuously and digitally stored.  Video monitoring was available and reviewed as appropriate. Description: EEG showed continuous generalized high amplitude sharply contoured rhythmic 3 to 4 Hz delta slowing with overriding 13 to 15 Hz beta activity.  At the beginning of the study, there was also sharp transients in vertex region admixed with delta slowing.  IV Versed  was administered at around 2227 on 10/23/2023.  Subsequently EEG continued to show continuous generalized and lateralized right hemisphere high amplitude sharply contoured rhythmic delta slowing as well as 8-9hz  alpha activity in left hemisphere. Hyperventilation and photic stimulation were not performed.   ABNORMALITY - Continuous rhythmic delta slow, generalized IMPRESSION: This study is suggestive of moderate to severe diffuse encephalopathy. This EEG pattern is on the ictal-interictal continuum. Additionally there is cortical dysfunction in right hemisphere likely secondary to underlying structural abnormality.  No definite seizures  were seen throughout the recording. Arlin KIDD Shelton   EEG adult Result Date: 10/18/2023 Shelton Arlin KIDD, MD     10/18/2023  8:45 AM Patient Name: [name] MRN: [id number] Epilepsy Attending: Arlin KIDD Shelton Referring Physician/Provider: Voncile Isles, MD Date: 10/18/2023 Duration: 23.47 mins Patient history: 78 y.o. female past history of anxiety, depression, hypertension, hyperlipidemia, insomnia who presented for evaluation of altered mental status after ingestion of THC/delta 8 gummy yesterday, then had a witnessed tonic-clonic seizure lasting about 1 minute in the ED. EEG to evaluate for seizure Level of alertness: Awake AEDs during EEG study: None Technical aspects: This EEG study was done with scalp electrodes positioned according to the 10-20 International system of electrode placement. Electrical activity was reviewed with band pass filter of 1-70Hz , sensitivity of 7 uV/mm, display speed of 89mm/sec with a 60Hz  notched filter applied as appropriate. EEG data were recorded continuously and digitally stored.  Video monitoring was available and reviewed as appropriate. Description: The posterior dominant rhythm consists of 8-9 Hz activity of moderate voltage (25-35 uV) seen predominantly in posterior head regions, symmetric and reactive to eye opening and eye closing. Hyperventilation and photic stimulation were not performed.   IMPRESSION: This study is within normal limits. No seizures or epileptiform discharges were seen throughout the recording. A normal interictal EEG does not exclude the diagnosis of epilepsy. Arlin KIDD Shelton   MR BRAIN WO CONTRAST Result Date: 10/17/2023 EXAM: MRI BRAIN WITHOUT CONTRAST 10/17/2023 10:51:19 PM TECHNIQUE: Multiplanar multisequence MRI of the head/brain was performed without the  administration of intravenous contrast. COMPARISON: None available. CLINICAL HISTORY: Delirium. FINDINGS: BRAIN AND VENTRICLES: No acute infarct. No  intracranial hemorrhage. No mass. No midline shift. No hydrocephalus. The sella is unremarkable. Normal flow voids. ORBITS: No acute abnormality. SINUSES AND MASTOIDS: No acute abnormality. BONES AND SOFT TISSUES: Normal marrow signal. No acute soft tissue abnormality. IMPRESSION: 1. No acute intracranial abnormality. Electronically signed by: Franky Stanford MD 10/17/2023 11:25 PM EDT RP Workstation: [id number]   DG Chest Portable 1 View Result Date: 10/17/2023 CLINICAL DATA:  Altered mental status, dizziness, chronic insomnia. EXAM: PORTABLE CHEST 1 VIEW COMPARISON:  Portable chest 12/25/2022. FINDINGS: The heart is slightly enlarged. No vascular congestion is seen. The mediastinum is normally outlined. There is mild calcification of the transverse aorta. There is a low inspiration on exam. The lungs appear generally clear but with limited view of the bases. Moderate thoracic spondylosis with osteopenia. Multiple overlying telemetry leads. IMPRESSION: 1. Low inspiration with no evidence of acute chest disease. Limited view of the bases. 2. Aortic atherosclerosis. Electronically Signed   By: Francis Quam M.D.   On: 10/17/2023 20:09   CT Head Wo Contrast Result Date: 10/17/2023 CLINICAL DATA:  Delirium and altered mental status. EXAM: CT HEAD WITHOUT CONTRAST TECHNIQUE: Contiguous axial images were obtained from the base of the skull through the vertex without intravenous contrast. RADIATION DOSE REDUCTION: This exam was performed according to the departmental dose-optimization program which includes automated exposure control, adjustment of the mA and/or kV according to patient size and/or use of iterative reconstruction technique. COMPARISON:  None. FINDINGS: Brain: There is mild cerebral and mild-to-moderate cerebellar atrophy, early features of small-vessel disease of the cerebral white matter. The ventricles are normal in size and position. Basal cisterns are clear. No cortical based acute infarct,  hemorrhage, mass or mass effect is seen. Vascular: There are calcific plaques in the carotid siphons. No hyperdense central vessel is seen. Skull: Negative for fractures or focal lesions. Hyperostosis frontalis interna. Sinuses/Orbits: No acute finding. Old lens replacements. Otherwise negative orbits. Clear sinuses and mastoids. S shaped nasal septum. Other: None. IMPRESSION: 1. No acute intracranial CT findings. 2. Atrophy and early small-vessel disease. 3. Carotid atherosclerosis. Electronically Signed   By: Francis Quam M.D.   On: 10/17/2023 20:07   DG Bone Density Result Date: 10/04/2023 EXAM: DUAL X-RAY ABSORPTIOMETRY (DXA) FOR BONE MINERAL DENSITY 10/03/2023 11:45 am CLINICAL DATA:  78 year old Female Postmenopausal. Patient is or has been on bone building therapies. TECHNIQUE: An axial (e.g., hips, spine) and/or appendicular (e.g., radius) exam was performed, as appropriate, using GE Secretary/administrator at [employer]. Images are obtained for bone mineral density measurement and are not obtained for diagnostic purposes. [id number] Exclusions: None. COMPARISON:  None. New baseline. FINDINGS: Scan quality: Good. LUMBAR SPINE (L1-L4): BMD (in g/cm2): 1.024 T-score: -1.3 Z-score: 0.5 LEFT FEMORAL NECK: BMD (in g/cm2): 0.718 T-score: -2.3 Z-score: -0.2 LEFT TOTAL HIP: BMD (in g/cm2): 0.818 T-score: -1.5 Z-score: 0.4 RIGHT FEMORAL NECK: BMD (in g/cm2): 0.653 T-score: -2.8 Z-score: -0.7 RIGHT TOTAL HIP: BMD (in g/cm2): 0.799 T-score: -1.7 Z-score: 0.3 FRAX 10-YEAR PROBABILITY OF FRACTURE: FRAX not reported as the lowest BMD is not in the osteopenia range. IMPRESSION: Osteoporosis based on BMD. Fracture risk is unknown due to history of bone building therapy. RECOMMENDATIONS: 1. All patients should optimize calcium  and vitamin D  intake. 2. Consider FDA-approved medical therapies in postmenopausal women and men aged 58 years and older, based on the following: - A hip or vertebral (clinical  or morphometric) fracture - T-score less than or equal to -2.5 and secondary causes have been excluded. - Low bone mass (T-score between -1.0 and -2.5) and a 10-year probability of a hip fracture greater than or equal to 3% or a 10-year probability of a major osteoporosis-related fracture greater than or equal to 20% based on the [place] -adapted WHO algorithm. - Clinician judgment and/or patient preferences may indicate treatment for people with 10-year fracture probabilities above or below these levels 3. Patients with diagnosis of osteoporosis or at high risk for fracture should have regular bone mineral density tests. For patients eligible for Medicare, routine testing is allowed once every 2 years. The testing frequency can be increased to one year for patients who have rapidly progressing disease, those who are receiving or discontinuing medical therapy to restore bone mass, or have additional risk factors. Electronically Signed   By: Reyes Phi M.D.   On: 10/04/2023 13:36    Microbiology: Results for orders placed or performed during the hospital encounter of 12/25/22  SARS Coronavirus 2 by RT PCR (hospital order, performed in [facility] hospital lab) *cepheid single result test* Anterior Nasal Swab     Status: None   Collection Time: 12/25/22  1:44 PM   Specimen: Anterior Nasal Swab  Result Value Ref Range Status   SARS Coronavirus 2 by RT PCR NEGATIVE NEGATIVE Final    Comment: (NOTE) SARS-CoV-2 target nucleic acids are NOT DETECTED.  The SARS-CoV-2 RNA is generally detectable in upper and lower respiratory specimens during the acute phase of infection. The lowest concentration of SARS-CoV-2 viral copies this assay can detect is 250 copies / mL. A negative result does not preclude SARS-CoV-2 infection and should not be used as the sole basis for treatment or other patient management decisions.  A negative result may occur with improper specimen collection / handling, submission of specimen  other than nasopharyngeal swab, presence of viral mutation(s) within the areas targeted by this assay, and inadequate number of viral copies (<250 copies / mL). A negative result must be combined with clinical observations, patient history, and epidemiological information.  Fact Sheet for Patients:   RoadLapTop.co.za  Fact Sheet for Healthcare Providers: http://kim-miller.com/  This test is not yet approved or  cleared by the [place]  FDA and has been authorized for detection and/or diagnosis of SARS-CoV-2 by FDA under an Emergency Use Authorization (EUA).  This EUA will remain in effect (meaning this test can be used) for the duration of the COVID-19 declaration under Section 564(b)(1) of the Act, 21 U.S.C. section 360bbb-3(b)(1), unless the authorization is terminated or revoked sooner.  Performed at [facility], [address] [place], [place] [address]     Labs: CBC: Recent Labs  Lab 10/22/23 2220 10/23/23 0743 10/24/23 0126 10/26/23 0757  WBC 14.3* 12.1* 11.6* 9.4  NEUTROABS 12.1*  --   --   --   HGB 11.9* 11.6* 10.2* 11.6*  HCT 37.3 35.6* 31.6* 36.1  MCV 83.8 83.0 84.9 83.2  PLT 456* 405* 361 407*   Basic Metabolic Panel: Recent Labs  Lab 10/22/23 2220 10/23/23 0743 10/24/23 0126 10/25/23 0510 10/26/23 0535  NA 138 139 141  --  142  K 3.1* 3.4* 3.2* 3.7 4.1  CL 100 102 107  --  111  CO2 19* 22 21*  --  16*  GLUCOSE 160* 105* 110*  --  103*  BUN 26* 23 22  --  30*  CREATININE 1.51* 1.16* 1.07*  --  0.95  CALCIUM  9.2 9.2 8.6*  --  8.5*  MG  --  2.0 1.7  --   --     CBG: Recent Labs  Lab 10/25/23 0249  GLUCAP 125*    Discharge time spent: 35 minutes.  Signed: Elgin Lam, MD [facility] 10/26/2023

## 2023-10-26 NOTE — Procedures (Signed)
 Patient Name: [name]  MRN: [id number]  Epilepsy Attending: Arlin MALVA Krebs  Referring Physician/Provider: Arlin Krebs, MD  Date: 10/26/2023 Duration: 33.12 mins   Patient history: 78 y.o. female past history of anxiety, depression, hypertension, hyperlipidemia, insomnia who presented for evaluation of altered mental status after ingestion of THC/delta 8 gummy yesterday, then had a witnessed tonic-clonic seizure lasting about 1 minute in the ED. EEG to evaluate for seizure   Level of alertness: Awake   AEDs during EEG study: None   Technical aspects: This EEG study was done with scalp electrodes positioned according to the 10-20 International system of electrode placement. Electrical activity was reviewed with band pass filter of 1-70Hz , sensitivity of 7 uV/mm, display speed of 15mm/sec with a 60Hz  notched filter applied as appropriate. EEG data were recorded continuously and digitally stored.  Video monitoring was available and reviewed as appropriate.   Description: The posterior dominant rhythm consists of 8-9 Hz activity of moderate voltage (25-35 uV) seen predominantly in posterior head regions, symmetric and reactive to eye opening and eye closing. Hyperventilation and photic stimulation were not performed.      IMPRESSION: This study is within normal limits. No seizures or epileptiform discharges were seen throughout the recording.   A normal interictal EEG does not exclude the diagnosis of epilepsy.     Kilah Drahos O Contina Strain

## 2023-10-26 NOTE — Evaluation (Signed)
 Occupational Therapy Evaluation Patient Details Name: [name] MRN: [id number] DOB: Nov 19, 1945 Today's Date: 10/26/2023   History of Present Illness   Pt is a 78 y.o. F who presents 10/22/2023 with staring and unresponsive episodes concerning for possible seizure like activity. Recent admission for similar presentation. MRI with no evidence of acute intracranial abnormality. PMH positive for HTN, GERD, MDD, GAD, insomina.     Clinical Impressions Pt admitted for above, PTA pt reports being ind with ADLs and ambulating with RW. Pt currently ambulating with CGA + RW and completing ADLs with CGA to setup A, she is very tremulous throughout session and noted DOE. Her cognition was overall WFL for STM word recall and clock draw, unable to assess medication management given her tremors restricting her manipulation of objects and pencils. OT to continue following pt acutely to progress pt as able. No post acute OT recommended at DC.      If plan is discharge home, recommend the following:   Assistance with cooking/housework     Functional Status Assessment   Patient has had a recent decline in their functional status and demonstrates the ability to make significant improvements in function in a reasonable and predictable amount of time.     Equipment Recommendations   None recommended by OT     Recommendations for Other Services         Precautions/Restrictions   Precautions Precautions: Fall Precaution/Restrictions Comments: watch HR Restrictions Weight Bearing Restrictions Per Provider Order: No     Mobility Bed Mobility Overal bed mobility: Modified Independent                  Transfers Overall transfer level: Needs assistance Equipment used: Rolling walker (2 wheels) Transfers: Sit to/from Stand Sit to Stand: Contact guard assist                  Balance Overall balance assessment: Needs assistance Sitting-balance support: Feet  supported       Standing balance support: Bilateral upper extremity supported, Reliant on assistive device for balance Standing balance-Leahy Scale: Poor                             ADL either performed or assessed with clinical judgement   ADL Overall ADL's : Needs assistance/impaired Eating/Feeding: Independent;Sitting;Bed level   Grooming: Standing;Contact guard assist;Wash/dry face   Upper Body Bathing: Set up;Sitting   Lower Body Bathing: Set up;Sitting/lateral leans   Upper Body Dressing : Sitting;Set up   Lower Body Dressing: Sit to/from stand;Contact guard assist   Toilet Transfer: Contact guard assist;Rolling walker (2 wheels);Ambulation;Grab bars;Regular Social worker and Hygiene: Contact guard assist;Sit to/from stand       Functional mobility during ADLs: Contact guard assist;Rolling walker (2 wheels)       Vision         Perception         Praxis         Pertinent Vitals/Pain Pain Assessment Pain Assessment: Faces Faces Pain Scale: Hurts little more Pain Location: low back Pain Descriptors / Indicators: Sore Pain Intervention(s): Monitored during session, Other (comment) (lidocaine  patch on)     Extremity/Trunk Assessment Upper Extremity Assessment Upper Extremity Assessment: Overall WFL for tasks assessed RUE Coordination: decreased gross motor LUE Coordination: decreased gross motor   Lower Extremity Assessment Lower Extremity Assessment: Overall WFL for tasks assessed   Cervical / Trunk Assessment Cervical / Trunk Assessment:  Kyphotic   Communication Communication Communication: Impaired Factors Affecting Communication: Hearing impaired   Cognition Arousal: Alert Behavior During Therapy: Anxious Cognition: No family/caregiver present to determine baseline             OT - Cognition Comments: Pt quite flustered, unsure of cause. Noted to be breathing heavily while OOB but denies SOB.  Significant tremors even while at rest. Pt scored 3/3 on STM word recall and performed clock draw accurately although some numbers hard to make out given onset of her tremors.                 Following commands: Impaired Following commands impaired: Follows one step commands inconsistently     Cueing  General Comments   Cueing Techniques: Verbal cues  HR up to 151 bpm in xtach with ambulation to bathroom. Down to 102-111bpm at rest, very tremulous. Mentioned information to the RN   Exercises     Shoulder Instructions      Home Living Family/patient expects to be discharged to:: Private residence Living Arrangements: Spouse/significant other Available Help at Discharge: Family;Available 24 hours/day Type of Home: House Home Access: Stairs to enter [employer] of Steps: 2 Entrance Stairs-Rails: Right Home Layout: One level     Bathroom Shower/Tub: Producer, television/film/video: Standard     Home Equipment: Agricultural consultant (2 wheels);Cane - single point;Grab bars - tub/shower          Prior Functioning/Environment Prior Level of Function : Independent/Modified Independent             Mobility Comments: Recently using RW since last admission, but otherwise typically completely independent ADLs Comments: ind    OT Problem List: Impaired balance (sitting and/or standing)   OT Treatment/Interventions: Self-care/ADL training;Therapeutic exercise;Patient/family education;Balance training;Therapeutic activities;DME and/or AE instruction      OT Goals(Current goals can be found in the care plan section)   Acute Rehab OT Goals Patient Stated Goal: To go home today OT Goal Formulation: With patient Time For Goal Achievement: 11/09/23 Potential to Achieve Goals: Good   OT Frequency:  Min 2X/week    Co-evaluation              AM-PAC OT 6 Clicks Daily Activity     Outcome Measure Help from another person eating meals?: A Little Help  from another person taking care of personal grooming?: A Little Help from another person toileting, which includes using toliet, bedpan, or urinal?: A Little Help from another person bathing (including washing, rinsing, drying)?: A Little Help from another person to put on and taking off regular upper body clothing?: A Little Help from another person to put on and taking off regular lower body clothing?: A Little 6 Click Score: 18   End of Session Equipment Utilized During Treatment: Gait belt;Rolling walker (2 wheels) Nurse Communication: Mobility status  Activity Tolerance: Patient tolerated treatment well Patient left: in bed;with call bell/phone within reach;with bed alarm set  OT Visit Diagnosis: Unsteadiness on feet (R26.81);Other abnormalities of gait and mobility (R26.89);Other symptoms and signs involving cognitive function                Time: 1349-1405 OT Time Calculation (min): 16 min Charges:  OT General Charges $OT Visit: 1 Visit OT Evaluation $OT Eval Low Complexity: 1 Low  10/26/2023  AB, OTR/L  Acute Rehabilitation Services  Office: [phone]   Curtistine JONETTA Das 10/26/2023, 4:02 PM

## 2023-10-26 NOTE — Plan of Care (Signed)
  Problem: Education: Goal: Knowledge of General Education information will improve Description: Including pain rating scale, medication(s)/side effects and non-pharmacologic comfort measures Outcome: Adequate for Discharge   Problem: Health Behavior/Discharge Planning: Goal: Ability to manage health-related needs will improve Outcome: Adequate for Discharge   Problem: Clinical Measurements: Goal: Ability to maintain clinical measurements within normal limits will improve Outcome: Adequate for Discharge Goal: Will remain free from infection Outcome: Adequate for Discharge Goal: Diagnostic test results will improve Outcome: Adequate for Discharge Goal: Respiratory complications will improve Outcome: Adequate for Discharge Goal: Cardiovascular complication will be avoided Outcome: Adequate for Discharge   Problem: Activity: Goal: Risk for activity intolerance will decrease Outcome: Adequate for Discharge   Problem: Nutrition: Goal: Adequate nutrition will be maintained Outcome: Adequate for Discharge   Problem: Coping: Goal: Level of anxiety will decrease Outcome: Adequate for Discharge   Problem: Elimination: Goal: Will not experience complications related to bowel motility Outcome: Adequate for Discharge Goal: Will not experience complications related to urinary retention Outcome: Adequate for Discharge   Problem: Pain Managment: Goal: General experience of comfort will improve and/or be controlled Outcome: Adequate for Discharge   Problem: Safety: Goal: Ability to remain free from injury will improve Outcome: Adequate for Discharge   Problem: Skin Integrity: Goal: Risk for impaired skin integrity will decrease Outcome: Adequate for Discharge   Problem: Acute Rehab PT Goals(only PT should resolve) Goal: Patient Will Transfer Sit To/From Stand Outcome: Adequate for Discharge Goal: Pt Will Ambulate Outcome: Adequate for Discharge Goal: Pt Will Go Up/Down  Stairs Outcome: Adequate for Discharge

## 2023-10-26 NOTE — Progress Notes (Addendum)
 Subjective: No acute events overnight.  No new concerns.  Requesting if she can go home today because she feels significantly better.  Husband at bedside agrees.   Denies any side effects on Vimpat .  States diarrhea has improved  ROS: negative except above  Examination  Vital signs in last 24 hours: Temp:  [98.1 F (36.7 C)-98.5 F (36.9 C)] 98.5 F (36.9 C) (09/12 0808) Pulse Rate:  [76-97] 88 (09/12 0808) Resp:  [17-19] 19 (09/12 0808) BP: (145-177)/(59-73) 177/73 (09/12 0808) SpO2:  [97 %-100 %] 100 % (09/12 0808)  General: lying in bed, NAD Neuro: MS: Alert, oriented, follows commands, can do serial sevens, attention span intact CN: pupils equal and reactive,  EOMI, face symmetric, tongue midline, normal sensation over face, Motor: 5/5 strength in all 4 extremities Coordination: normal Gait: not tested  Basic Metabolic Panel: Recent Labs  Lab 10/22/23 2220 10/23/23 0743 10/24/23 0126 10/25/23 0510 10/26/23 0535  NA 138 139 141  --  142  K 3.1* 3.4* 3.2* 3.7 4.1  CL 100 102 107  --  111  CO2 19* 22 21*  --  16*  GLUCOSE 160* 105* 110*  --  103*  BUN 26* 23 22  --  30*  CREATININE 1.51* 1.16* 1.07*  --  0.95  CALCIUM  9.2 9.2 8.6*  --  8.5*  MG  --  2.0 1.7  --   --     CBC: Recent Labs  Lab 10/22/23 2220 10/23/23 0743 10/24/23 0126 10/26/23 0757  WBC 14.3* 12.1* 11.6* 9.4  NEUTROABS 12.1*  --   --   --   HGB 11.9* 11.6* 10.2* 11.6*  HCT 37.3 35.6* 31.6* 36.1  MCV 83.8 83.0 84.9 83.2  PLT 456* 405* 361 407*     Coagulation Studies: No results for input(s): LABPROT, INR in the last 72 hours.  Imaging No new brain imaging overnight   ASSESSMENT AND PLAN:78 year old female with history of hypertension, hyperlipidemia, chronic kidney disease, anxiety and depression who presented with episodes of confusion/staring.  Of note patient had similar presentation last week and at that time reported consuming about 8 THC Gummies.  Denies any further drug  use.   Acute encephalopathy - MRI brain did not show any acute abnormality.  However on EEG she has diffuse generalized high amplitude delta slowing which appears rhythmic. -This could be secondary to toxic -metabolic etiology, however her ammonia is not elevated enough to explain the intermittent confusion and she has not used any more THC Gummies so it is unlikely that that still contributing.  Husband did report some episodes of forgetfulness for a while.  Therefore it is possible that in the setting of baseline mild neurocognitive impairment THC is now causing longer-lasting issues and will gradually improve. Another differential includes autoimmune encephalitis given the clinical presentation as well as EEG - Unfortunately fluoro-guided lumbar puncture was also not successful.  However patient appears to be improving today.  Therefore we will defer lumbar puncture for now - Continue Vimpat  50 mg twice daily -Will repeat routine EEG to assess for improvement -Patient requesting something to help her sleep.  I recommend follow-up with PCP as well as psychiatry.  However I will prescribe clonazepam  to help her for the next few days as it worked really well prior to her lumbar puncture -As patient improving, most likely okay to discharge from neurology standpoint -Will schedule follow-up with neurology in 4 to 6 weeks - Discussed plan with patient, husband at bedside, as well  as Dr. Briana via secure chat  Seizure precautions: Per [place] statutes, patients with seizures are not allowed to drive until they have been seizure-free for six months and cleared by a physician    Use caution when using heavy equipment or power tools. Avoid working on ladders or at heights. Take showers instead of baths. Ensure the water temperature is not too high on the home water heater. Do not go swimming alone. Do not lock yourself in a room alone (i.e. bathroom). When caring for infants or small children,  sit down when holding, feeding, or changing them to minimize risk of injury to the child in the event you have a seizure. Maintain good sleep hygiene. Avoid alcohol.    If patient has another seizure, call 911 and bring them back to the ED if: A.  The seizure lasts longer than 5 minutes.      B.  The patient doesn't wake shortly after the seizure or has new problems such as difficulty seeing, speaking or moving following the seizure C.  The patient was injured during the seizure D.  The patient has a temperature over 102 F (39C) E.  The patient vomited during the seizure and now is having trouble breathing    During the Seizure   - First, ensure adequate ventilation and place patients on the floor on their left side  Loosen clothing around the neck and ensure the airway is patent. If the patient is clenching the teeth, do not force the mouth open with any object as this can cause severe damage - Remove all items from the surrounding that can be hazardous. The patient may be oblivious to what's happening and may not even know what he or she is doing. If the patient is confused and wandering, either gently guide him/her away and block access to outside areas - Reassure the individual and be comforting - Call 911. In most cases, the seizure ends before EMS arrives. However, there are cases when seizures may last over 3 to 5 minutes. Or the individual may have developed breathing difficulties or severe injuries. If a pregnant patient or a person with diabetes develops a seizure, it is prudent to call an ambulance.     After the Seizure (Postictal Stage)   After a seizure, most patients experience confusion, fatigue, muscle pain and/or a headache. Thus, one should permit the individual to sleep. For the next few days, reassurance is essential. Being calm and helping reorient the person is also of importance.   Most seizures are painless and end spontaneously. Seizures are not harmful to others but  can lead to complications such as stress on the lungs, brain and the heart. Individuals with prior lung problems may develop labored breathing and respiratory distress.             I personally spent a total of 39 minutes in the care of the patient today including getting/reviewing separately obtained history, performing a medically appropriate exam/evaluation, counseling and educating, placing orders, referring and communicating with other health care professionals, documenting clinical information in the EHR, independently interpreting results, and coordinating care.      Arlin Krebs Epilepsy [facility] For questions after 5pm please refer to AMION to reach the Neurologist on call

## 2023-10-29 ENCOUNTER — Telehealth: Payer: Self-pay

## 2023-10-29 LAB — ANCA PROFILE
Anti-MPO Antibodies: <0.2 U (ref 0.0–0.9)
Anti-PR3 Antibodies: 0.5 U (ref 0.0–0.9)
Atypical P-ANCA titer: <1:20 {titer}
C-ANCA: <1:20 {titer}
P-ANCA: <1:20 {titer}

## 2023-10-29 NOTE — Telephone Encounter (Unsigned)
 Copied from CRM [id number]. Topic: General - Other >> Oct 29, 2023 11:05 AM Robinson H wrote: Reason for CRM: Patient requested delay in start of care for physical therapy to 9/17, wanted to notify provider.   [facility] [phone]

## 2023-10-29 NOTE — Transitions of Care (Post Inpatient/ED Visit) (Unsigned)
   10/29/2023  Name: Siddalee Vanderheiden MRN: [id number] DOB: 1945-10-27  Today's TOC FU Call Status: Today's TOC FU Call Status:: Unsuccessful Call (1st Attempt) Unsuccessful Call (1st Attempt) Date: 10/29/23  Attempted to reach the patient regarding the most recent Inpatient/ED visit.  Follow Up Plan: Additional outreach attempts will be made to reach the patient to complete the Transitions of Care (Post Inpatient/ED visit) call.   Signature Julian Lemmings, LPN [facility] Nurse Health Advisor Direct Dial [phone]

## 2023-10-30 ENCOUNTER — Telehealth (HOSPITAL_COMMUNITY): Payer: Self-pay | Admitting: *Deleted

## 2023-10-30 ENCOUNTER — Other Ambulatory Visit (HOSPITAL_COMMUNITY): Payer: Self-pay | Admitting: Psychiatry

## 2023-10-30 DIAGNOSIS — F5101 Primary insomnia: Secondary | ICD-10-CM

## 2023-10-30 NOTE — Transitions of Care (Post Inpatient/ED Visit) (Unsigned)
   10/30/2023  Name: [name] MRN: [id number] DOB: 04-30-1945  Today's TOC FU Call Status: Today's TOC FU Call Status:: Unsuccessful Call (2nd Attempt) Unsuccessful Call (1st Attempt) Date: 10/29/23 Unsuccessful Call (2nd Attempt) Date: 10/30/23  Attempted to reach the patient regarding the most recent Inpatient/ED visit.  Follow Up Plan: Additional outreach attempts will be made to reach the patient to complete the Transitions of Care (Post Inpatient/ED visit) call.   Signature Julian Lemmings, LPN [facility] Nurse Health Advisor Direct Dial [phone]

## 2023-10-30 NOTE — Telephone Encounter (Signed)
 Pt called requesting a refill of the Zolpidem  5 mg (7.5 mg total dose). Pt does not have f/u scheduled.

## 2023-10-31 ENCOUNTER — Telehealth: Payer: Self-pay | Admitting: *Deleted

## 2023-10-31 NOTE — Telephone Encounter (Signed)
Patient will discuss at upcoming appointment

## 2023-10-31 NOTE — Telephone Encounter (Signed)
 Copied from CRM #[id number]. Topic: Clinical - Home Health Verbal Orders >> Oct 31, 2023  1:29 PM [name] wrote: Caller/Agency: [facility] [place] Callback Number: [phone] Service Requested: Physical Therapy Frequency: 1x week 8 weeks  Any new concerns about the patient? Rested heart rate above 100 and goes up 120 ,fatigue easily and she has chronic insomnia and worst lately, missing medication QUEtiapine  (SEROQUEL ) 25 MG tablet, clonazePAM  (KLONOPIN ) 0.25 MG disintegrating tablet,zolpidem  (AMBIEN ) 5 MG tablet. And has new pain range from 1 to 8 in low back and worst when standing and thinks its from multiple attempts from spinal

## 2023-10-31 NOTE — Transitions of Care (Post Inpatient/ED Visit) (Signed)
 10/31/2023  Name: [name] MRN: [id number] DOB: 1945/12/27  Today's TOC FU Call Status: Today's TOC FU Call Status:: Successful TOC FU Call Completed Unsuccessful Call (1st Attempt) Date: 10/29/23 Unsuccessful Call (2nd Attempt) Date: 10/30/23 [facility] FU Call Complete Date: 10/31/23 Patient's Name and Date of Birth confirmed.  Transition Care Management Follow-up Telephone Call Date of Discharge: 10/26/23 Discharge Facility: [name] Type of Discharge: Inpatient Admission Primary Inpatient Discharge Diagnosis:: altered mental status How have you been since you were released from the hospital?: Better Any questions or concerns?: No  Items Reviewed: Did you receive and understand the discharge instructions provided?: Yes Medications obtained,verified, and reconciled?: Yes (Medications Reviewed) Any new allergies since your discharge?: No Dietary orders reviewed?: Yes Do you have support at home?: Yes People in Home [RPT]: spouse  Medications Reviewed Today: Medications Reviewed Today     Reviewed by Emmitt Pan, LPN (Licensed Practical Nurse) on 10/31/23 at 1022  Med List Status: <None>   Medication Order Taking? Sig Documenting Provider Last Dose Status Informant  amLODipine -benazepril  (LOTREL ) 5-20 MG capsule [id number] Yes Take 1 capsule by mouth daily. Wendolyn Jenkins Jansky, MD  Active Self, Pharmacy Records  atorvastatin  (LIPITOR) 20 MG tablet [id number] Yes Take 1 tablet (20 mg total) by mouth daily.  Patient taking differently: Take 20 mg by mouth at bedtime.   Wendolyn Jenkins Jansky, MD  Active Self, Pharmacy Records  clonazePAM  (KLONOPIN ) 0.25 MG disintegrating tablet [id number] Yes Take 1 tablet (0.25 mg total) by mouth at bedtime as needed for up to 10 doses (insomnia, anxiety, seizure). For Seizure ONLY, can take 4 tablets for seizure lasting more than 2 minutes Yadav, Priyanka O, MD  Active   cyanocobalamin  1000 MCG tablet [id number] Yes Take 0.5 tablets (500  mcg total) by mouth daily. Briana Elgin LABOR, MD  Active   lacosamide  (VIMPAT ) 50 MG TABS tablet [id number] Yes Take 1 tablet (50 mg total) by mouth 2 (two) times daily. Yadav, Priyanka O, MD  Active   Multiple Vitamins-Minerals (MULTIVITAMIN WOMEN 50+) TABS [id number] Yes Take 1 tablet by mouth daily. [provider]  Active Self, Pharmacy Records  omeprazole  (PRILOSEC) 40 MG capsule [id number] Yes Take 1 capsule (40 mg total) by mouth daily. Wendolyn Jenkins Jansky, MD  Active Self, Pharmacy Records  OVER THE COUNTER MEDICATION [id number] Yes Take 1 capsule by mouth every 12 (twelve) hours as needed (headache, cold/flu-like symptoms). OTC cold capsules [provider]  Active Self, Pharmacy Records  QUEtiapine  (SEROQUEL ) 25 MG tablet [id number] Yes Take 0.5-1 tablets (12.5-25 mg total) by mouth at bedtime. Start at 1/2 tablet and increase to full tablet if needed/tolerated.  Patient taking differently: Take 25 mg by mouth at bedtime.   Carvin Arvella HERO, MD  Active Self, Pharmacy Records  raloxifene  (EVISTA ) 60 MG tablet [id number] Yes Take 1 tablet (60 mg total) by mouth daily. Wendolyn Jenkins Jansky, MD  Active Self, Pharmacy Records  triamterene -hydrochlorothiazide  (DYAZIDE ) 37.5-25 MG capsule [id number] Yes Take 1 capsule by mouth daily. Wendolyn Jenkins Jansky, MD  Active Self, Pharmacy Records  venlafaxine  XR (EFFEXOR -XR) 150 MG 24 hr capsule [id number] Yes Take 1 capsule (150 mg total) by mouth daily with breakfast.  Patient taking differently: Take 150 mg by mouth at bedtime.   Mercy Lauraine LABOR  Active Self, Pharmacy Records  zolpidem  (AMBIEN ) 5 MG tablet [id number] Yes Take 1.5 tablets (7.5 mg total) by mouth at bedtime as needed for sleep. Mercy Lauraine LABOR Keren Self, Pharmacy Records  Home Care and Equipment/Supplies: Were Home Health Services Ordered?: Yes Name of Home Health Agency:: unknown Has Agency set up a time to come to your home?: Yes First Home Health Visit Date:  10/31/23 Any new equipment or medical supplies ordered?: NA  Functional Questionnaire: Do you need assistance with bathing/showering or dressing?: Yes Do you need assistance with meal preparation?: Yes Do you need assistance with eating?: No Do you have difficulty maintaining continence: No Do you need assistance with getting out of bed/getting out of a chair/moving?: No Do you have difficulty managing or taking your medications?: No  Follow up appointments reviewed: PCP Follow-up appointment confirmed?: Yes Date of PCP follow-up appointment?: 11/05/23 Follow-up Provider: [facility] Follow-up appointment confirmed?: Yes Date of Specialist follow-up appointment?: 11/07/23 Do you need transportation to your follow-up appointment?: No Do you understand care options if your condition(s) worsen?: Yes-patient verbalized understanding    SIGNATURE Julian Lemmings, LPN [facility] Nurse Health Advisor Direct Dial [phone]

## 2023-11-01 ENCOUNTER — Encounter (HOSPITAL_COMMUNITY): Payer: Self-pay

## 2023-11-01 ENCOUNTER — Ambulatory Visit: Admitting: Neurology

## 2023-11-01 ENCOUNTER — Other Ambulatory Visit (HOSPITAL_COMMUNITY): Payer: Self-pay

## 2023-11-05 ENCOUNTER — Telehealth (HOSPITAL_COMMUNITY): Payer: Self-pay | Admitting: Psychiatry

## 2023-11-05 ENCOUNTER — Inpatient Hospital Stay: Admitting: Family Medicine

## 2023-11-05 NOTE — Telephone Encounter (Signed)
 Patient missed appointment on 10/17/23 due to hospitalization and would like to make another appt. Patient extended her apologies for missing the appt. She can be reached at [phone] to reschedule. Please advise. Thank you.

## 2023-11-06 ENCOUNTER — Other Ambulatory Visit (HOSPITAL_COMMUNITY): Payer: Self-pay | Admitting: Psychiatry

## 2023-11-06 ENCOUNTER — Ambulatory Visit: Admitting: Family Medicine

## 2023-11-06 ENCOUNTER — Encounter: Payer: Self-pay | Admitting: Family Medicine

## 2023-11-06 ENCOUNTER — Other Ambulatory Visit (HOSPITAL_COMMUNITY): Payer: Self-pay

## 2023-11-06 VITALS — BP 130/70 | HR 96 | Temp 97.9°F | Resp 18 | Ht 62.0 in | Wt 186.5 lb

## 2023-11-06 DIAGNOSIS — F411 Generalized anxiety disorder: Secondary | ICD-10-CM | POA: Diagnosis not present

## 2023-11-06 DIAGNOSIS — M5441 Lumbago with sciatica, right side: Secondary | ICD-10-CM

## 2023-11-06 DIAGNOSIS — R569 Unspecified convulsions: Secondary | ICD-10-CM | POA: Diagnosis not present

## 2023-11-06 DIAGNOSIS — N179 Acute kidney failure, unspecified: Secondary | ICD-10-CM | POA: Diagnosis not present

## 2023-11-06 DIAGNOSIS — I10 Essential (primary) hypertension: Secondary | ICD-10-CM

## 2023-11-06 DIAGNOSIS — F5101 Primary insomnia: Secondary | ICD-10-CM | POA: Diagnosis not present

## 2023-11-06 DIAGNOSIS — F3340 Major depressive disorder, recurrent, in remission, unspecified: Secondary | ICD-10-CM

## 2023-11-06 MED ORDER — QUETIAPINE FUMARATE 25 MG PO TABS
12.5000 mg | ORAL_TABLET | Freq: Every evening | ORAL | 1 refills | Status: DC | PRN
  Filled 2023-11-06: qty 30, 30d supply, fill #0

## 2023-11-06 MED ORDER — VENLAFAXINE HCL ER 150 MG PO CP24
150.0000 mg | ORAL_CAPSULE | Freq: Every day | ORAL | 0 refills | Status: DC
  Filled 2023-11-06: qty 90, 90d supply, fill #0

## 2023-11-06 MED ORDER — ZOLPIDEM TARTRATE 5 MG PO TABS
5.0000 mg | ORAL_TABLET | Freq: Every evening | ORAL | 1 refills | Status: DC | PRN
  Filled 2023-11-06 – 2023-11-14 (×4): qty 45, 30d supply, fill #0
  Filled 2023-12-04: qty 45, 30d supply, fill #1

## 2023-11-06 MED ORDER — ZOLPIDEM TARTRATE 5 MG PO TABS
7.5000 mg | ORAL_TABLET | Freq: Every evening | ORAL | 0 refills | Status: DC | PRN
  Filled 2023-11-06: qty 15, 10d supply, fill #0

## 2023-11-06 MED ORDER — CYCLOBENZAPRINE HCL 5 MG PO TABS
5.0000 mg | ORAL_TABLET | Freq: Three times a day (TID) | ORAL | 0 refills | Status: DC | PRN
  Filled 2023-11-06: qty 15, 5d supply, fill #0

## 2023-11-06 MED ORDER — NAPROXEN 500 MG PO TABS
500.0000 mg | ORAL_TABLET | Freq: Two times a day (BID) | ORAL | 0 refills | Status: DC
  Filled 2023-11-06: qty 30, 15d supply, fill #0

## 2023-11-06 NOTE — Patient Instructions (Signed)
 Naproxen  2x/daily.   No ibuprofen Tylenol  every 6 hrs as needed  Flexeril  every 8 hrs as needed  Icey hot, lidocaine  pain patches.

## 2023-11-06 NOTE — Progress Notes (Signed)
 Subjective:     Patient ID: [name], female    DOB: Nov 05, 1945, 78 y.o.   MRN: [id number]  Chief Complaint  Patient presents with   Follow-up    Hospital follow-up for seizure Discuss medications, need refill of Ambien      HPI 9-8-9/12, 9/3-9/4-ams and sz Discussed the use of AI scribe software for clinical note transcription with the patient, who gave verbal consent to proceed.  History of Present Illness [name] is a 78 year old female who presents with confusion and seizures following THC gummy ingestion. She is accompanied by her husband.  She was in her normal state of health until September 3rd when she ingested half of a THC gummy to aid with sleep. She subsequently experienced confusion, sent to ER, and had a seizure in the emergency room. Initial tests were negative. No changes were made to her medications at that time.  Four days after the initial hospital visit, she experienced another episode of decreased responsiveness and had a seizure at home, prompting another hospital admission. During this admission, she underwent two MRIs, a CT scan, and two EEGs. The EEGs initially showed slow brain waves, which normalized by the time of discharge. Has f/u w/neuro  She has a history of psychiatric medication use, including Effexor  and Seroquel , which she had recently run out of. She also ran out of zolpidem , which she uses for sleep, and clonazepam , which was provided for a few days post-discharge. She is not sleeping well, getting only two to three hours of sleep per night.missed appt w/BH so needs zolpidem .  No SI.  Has call in to them as well, but very concerned about no meds.   She reports significant back pain, particularly on the right side, following multiple unsuccessful attempts at a spinal tap during her second hospital stay. The pain is described as soreness and tightness, affecting her ability to stand for long periods. She has been managing the  pain with ibuprofen, as Tylenol  is ineffective.  No drainage from the spinal tap site, major headaches, fevers, or chills. She feels shaky, which she attributes to nerves and the overall stress of recent events. No suicidal thoughts or dizziness, although she feels a bit shaky when walking after prolonged sitting or lying down.  Her blood pressure medications were held during her hospital stay due to concerns about kidney function, but she has resumed them since discharge. She is currently taking anti-seizure medication, although she does not feel any effects from it.    Health Maintenance Due  Topic Date Due   Medicare Annual Wellness (AWV)  08/08/2023    Past Medical History:  Diagnosis Date   Allergy 1980's   Sulfa drugs and penecillin   Anxiety    Cataract 2021   Corrected   Chronic kidney disease 2024   Stage 3   Depression    Headache    SINUS   Hyperlipidemia    Hypertension 2021   Osteopenia    Osteopenia    Primary localized osteoarthritis of right knee    Seizures (HCC) 10/2023    Past Surgical History:  Procedure Laterality Date   BREAST EXCISIONAL BIOPSY Right    benign cyst removal    CARPAL TUNNEL RELEASE Right 2015   Ortho in [place]   CHOLECYSTECTOMY N/A 02/08/2022   Procedure: LAPAROSCOPIC CHOLECYSTECTOMY WITH INTRAOPERATIVE CHOLANGIOGRAM;  Surgeon: Debby Hila, MD;  Location: [facility] ORS;  Service: General;  Laterality: N/A;   EYE SURGERY  12/2019  cataracts   FRACTURE SURGERY  2016   Rt.  leg   JOINT REPLACEMENT  09/2015   Right knee   LASIK     MENISCUS REPAIR Right 2011   Orthopedic in [facility]   TOTAL KNEE ARTHROPLASTY Right 09/20/2015   TOTAL KNEE ARTHROPLASTY Right 09/20/2015   Procedure: TOTAL KNEE ARTHROPLASTY;  Surgeon: Lamar Millman, MD;  Location: [facility] OR;  Service: Orthopedics;  Laterality: Right;   Trigger Thumb Right 2015   Done in [place] at the same time as the carpal tunnel release   TUBAL LIGATION  1980     Current Outpatient  Medications:    amLODipine -benazepril  (LOTREL ) 5-20 MG capsule, Take 1 capsule by mouth daily., Disp: 90 capsule, Rfl: 3   atorvastatin  (LIPITOR) 20 MG tablet, Take 1 tablet (20 mg total) by mouth daily., Disp: 90 tablet, Rfl: 3   clonazePAM  (KLONOPIN ) 0.25 MG disintegrating tablet, Take 1 tablet (0.25 mg total) by mouth at bedtime as needed for up to 10 doses (insomnia, anxiety, seizure). For Seizure ONLY, can take 4 tablets for seizure lasting more than 2 minutes, Disp: 10 tablet, Rfl: 0   cyanocobalamin  1000 MCG tablet, Take 0.5 tablets (500 mcg total) by mouth daily., Disp: 90 tablet, Rfl: 0   cyclobenzaprine  (FLEXERIL ) 5 MG tablet, Take 1 tablet (5 mg total) by mouth 3 (three) times daily as needed for muscle spasms., Disp: 15 tablet, Rfl: 0   lacosamide  (VIMPAT ) 50 MG TABS tablet, Take 1 tablet (50 mg total) by mouth 2 (two) times daily., Disp: 180 tablet, Rfl: 0   Multiple Vitamins-Minerals (MULTIVITAMIN WOMEN 50+) TABS, Take 1 tablet by mouth daily., Disp: , Rfl:    naproxen  (NAPROSYN ) 500 MG tablet, Take 1 tablet (500 mg total) by mouth 2 (two) times daily with a meal., Disp: 30 tablet, Rfl: 0   omeprazole  (PRILOSEC) 40 MG capsule, Take 1 capsule (40 mg total) by mouth daily., Disp: 90 capsule, Rfl: 0   OVER THE COUNTER MEDICATION, Take 1 capsule by mouth every 12 (twelve) hours as needed (headache, cold/flu-like symptoms). OTC cold capsules, Disp: , Rfl:    raloxifene  (EVISTA ) 60 MG tablet, Take 1 tablet (60 mg total) by mouth daily., Disp: 90 tablet, Rfl: 3   triamterene -hydrochlorothiazide  (DYAZIDE ) 37.5-25 MG capsule, Take 1 capsule by mouth daily., Disp: 90 capsule, Rfl: 3   venlafaxine  XR (EFFEXOR -XR) 150 MG 24 hr capsule, Take 1 capsule (150 mg total) by mouth daily with breakfast., Disp: 90 capsule, Rfl: 1   zolpidem  (AMBIEN ) 5 MG tablet, Take 1.5 tablets (7.5 mg total) by mouth at bedtime as needed for sleep., Disp: 15 tablet, Rfl: 0  Allergies  Allergen Reactions   Penicillins  Hives    Tolerates Cephalosporins    Prednisone  Other (See Comments)    Hyperactivity Sleep disturbance Confusion   Sulfa Antibiotics Hives        ROS neg/noncontributory except as noted HPI/below      Objective:     BP 130/70 (BP Location: Left Arm, Patient Position: Sitting, Cuff Size: Normal)   Pulse 96   Temp 97.9 F (36.6 C) (Temporal)   Resp 18   Ht 5' 2 (1.575 m)   Wt 186 lb 8 oz (84.6 kg)   SpO2 98%   BMI 34.11 kg/m  Wt Readings from Last 3 Encounters:  11/06/23 186 lb 8 oz (84.6 kg)  10/22/23 184 lb (83.5 kg)  10/17/23 191 lb (86.6 kg)    Physical Exam   Gen: WDWN NAD HEENT:  NCAT, conjunctiva not injected, sclera nonicteric NECK:  supple, no thyromegaly, no nodes, no carotid bruits CARDIAC: tachy RRR, S1S2+, 1-2/6 murmur.  LUNGS: CTAB. No wheezes ABDOMEN:  BS+, soft, NTND, No HSM, no masses EXT:  no edema MSK: no gross abnormalities.   Back:  can stand on heels/toes/1 leg.  No TTP. Spine  +R SI tenderness. No rash.  MS 5/5 BLE. .  SLR neg B.  Good ROM  NEURO: A&O x3.  CN II-XII intact. tremors PSYCH: normal mood. Good eye contact  Reviewed hosp records.  Reconciled med list and no changes  Pdmp checked.   Messaged psych and they are aware I sent in short supply of zolpidem      Assessment & Plan:  Seizure-like activity (HCC)  Essential hypertension  AKI (acute kidney injury)  Acute midline low back pain with right-sided sciatica  Generalized anxiety disorder  Primary insomnia  Other orders -     Zolpidem  Tartrate; Take 1.5 tablets (7.5 mg total) by mouth at bedtime as needed for sleep.  Dispense: 15 tablet; Refill: 0 -     Cyclobenzaprine  HCl; Take 1 tablet (5 mg total) by mouth 3 (three) times daily as needed for muscle spasms.  Dispense: 15 tablet; Refill: 0 -     Naproxen ; Take 1 tablet (500 mg total) by mouth 2 (two) times daily with a meal.  Dispense: 30 tablet; Refill: 0  Assessment and Plan Assessment & Plan Acute right lower  back pain post-lumbar puncture   Persistent right lower back pain follows multiple unsuccessful lumbar puncture attempts, described as soreness and tightness with radiation down the leg. There are no signs of infection or CSF leak. Pain worsens with movement and prolonged standing. Evaluate back pain further before deciding on treatment. Advise taking ibuprofen and acetaminophen  together for pain management. Flexeril  5mg  tid prn-aware may make drowsy  Recent seizures, resolved   Seizures occurred after THC gummy use, with subsequent episodes possibly related to medication changes. Extensive testing, including MRIs, CT scan, and EEGs, showed no definitive cause. EEG initially showed slow brain waves, which normalized before discharge. No further seizures or confusion reported. Continue anti-seizure medication as prescribed. Follow up with neurology for further evaluation and interpretation of autoimmune tests.  Insomnia, ongoing   Ongoing insomnia is exacerbated by recent hospitalizations and medication changes. Previously managed with zolpidem , which she has run out of. The psychiatrist manages her sleep medications. Contact psychiatrist to discuss zolpidem  refill. Provide a few zolpidem  tablets to manage immediate insomnia. Advise her to follow up with psychiatrist for ongoing management.  Anxiety symptoms   Anxiety symptoms are likely exacerbated by recent health events and hospitalizations, including shakiness and sweating when anxious. Encourage relaxation techniques and deep breathing exercises.  Hypertension   Blood pressure is elevated during the visit, likely due to stress and recent health events. Medications were adjusted during hospitalization due to concerns about kidney function and potassium levels. Review current blood pressure medications to ensure appropriate management.    Return for keep appt for 10/14.  Jenkins CHRISTELLA Carrel, MD

## 2023-11-07 ENCOUNTER — Ambulatory Visit (INDEPENDENT_AMBULATORY_CARE_PROVIDER_SITE_OTHER): Admitting: Neurology

## 2023-11-07 ENCOUNTER — Encounter: Payer: Self-pay | Admitting: Neurology

## 2023-11-07 ENCOUNTER — Other Ambulatory Visit: Payer: Self-pay

## 2023-11-07 ENCOUNTER — Other Ambulatory Visit (HOSPITAL_COMMUNITY): Payer: Self-pay

## 2023-11-07 VITALS — BP 134/81 | HR 96 | Ht 62.0 in | Wt 190.0 lb

## 2023-11-07 DIAGNOSIS — R569 Unspecified convulsions: Secondary | ICD-10-CM | POA: Diagnosis not present

## 2023-11-07 MED ORDER — LACOSAMIDE 50 MG PO TABS
50.0000 mg | ORAL_TABLET | Freq: Two times a day (BID) | ORAL | 2 refills | Status: DC
  Filled 2023-11-07 – 2023-11-23 (×2): qty 180, 90d supply, fill #0

## 2023-11-07 NOTE — Progress Notes (Signed)
 [facility]  PATIENT: [name] DOB: 11-Sep-1945  REQUESTING CLINICIAN: Shelton Arlin KIDD, MD HISTORY FROM: Patient/Husband  REASON FOR VISIT: Seizure in the setting of THC use    HISTORICAL  CHIEF COMPLAINT:  Chief Complaint  Patient presents with   RM13/SEIZURE    Pt is here with her Husband. Pt states that she had taken a gummy with THC in it and it caused her Seizure. Negative hx of Seizures.     HISTORY OF PRESENT ILLNESS:  This is a 78 year old woman with past medical history of anxiety/depression, hypertension, hyperlipidemia, chronic insomnia who is presenting after a seizure in the setting of THC use.  Patient was struggling with insomnia, prior some THC to sleep but the next morning was acting incoherently.  Husband called EMS and en route to the hospital she did have a seizures with tongue biting and bowel or bladder incontinence.  She was admitted, workup including EEG  no seizures and her MRI was unrevealing other than brain atrophy.  Her mental status improved and she was discharged home.  5 days later she represented to the ED due to altered mental status again EEG initially showed diffuse delta slowing which then normalized later.  No seizures was captured.  She was started on Vimpat  50 mg twice daily and has been doing well since.  She denies any previous history of seizures, denies any seizure risk factor other than using THC.  Husband tells me that her mental status is improving, however she appears more anxious.  Handedness: right handed   Onset: 10/17/2023  Seizure Type: Generalized convulsion   Current frequency: Once on 9/2  Any injuries from seizures: Back soreness, tongue biting   Seizure risk factors: use of THC  Previous ASMs: None   Currenty ASMs: Vimpat  50 mg twice daily   ASMs side effects: Denies   Brain Images: Atrophy   Previous EEGs: Diffuse slowing, repeat EEG normal    OTHER MEDICAL CONDITIONS:  Anxiety/Depression, Hypertension, hyperlipidemia, Arthritis, Insomnia   REVIEW OF SYSTEMS: Full 14 system review of systems performed and negative with exception of: As noted in the HPI   ALLERGIES: Allergies  Allergen Reactions   Penicillins Hives    Tolerates Cephalosporins    Prednisone  Other (See Comments)    Hyperactivity Sleep disturbance Confusion   Sulfa Antibiotics Hives         HOME MEDICATIONS: Outpatient Medications Prior to Visit  Medication Sig Dispense Refill   amLODipine -benazepril  (LOTREL ) 5-20 MG capsule Take 1 capsule by mouth daily. 90 capsule 3   atorvastatin  (LIPITOR) 20 MG tablet Take 1 tablet (20 mg total) by mouth daily. 90 tablet 3   cyanocobalamin  1000 MCG tablet Take 0.5 tablets (500 mcg total) by mouth daily. 90 tablet 0   cyclobenzaprine  (FLEXERIL ) 5 MG tablet Take 1 tablet (5 mg total) by mouth 3 (three) times daily as needed for muscle spasms. 15 tablet 0   Multiple Vitamins-Minerals (MULTIVITAMIN WOMEN 50+) TABS Take 1 tablet by mouth daily.     naproxen  (NAPROSYN ) 500 MG tablet Take 1 tablet (500 mg total) by mouth 2 (two) times daily with a meal. 30 tablet 0   omeprazole  (PRILOSEC) 40 MG capsule Take 1 capsule (40 mg total) by mouth daily. 90 capsule 0   OVER THE COUNTER MEDICATION Take 1 capsule by mouth every 12 (twelve) hours as needed (headache, cold/flu-like symptoms). OTC cold capsules     QUEtiapine  (SEROQUEL ) 25 MG tablet Take 0.5-1 tablets (12.5-25 mg total)  by mouth at bedtime as needed (sleep). 30 tablet 1   raloxifene  (EVISTA ) 60 MG tablet Take 1 tablet (60 mg total) by mouth daily. 90 tablet 3   triamterene -hydrochlorothiazide  (DYAZIDE ) 37.5-25 MG capsule Take 1 capsule by mouth daily. 90 capsule 3   venlafaxine  XR (EFFEXOR -XR) 150 MG 24 hr capsule Take 1 capsule (150 mg total) by mouth daily with breakfast. 90 capsule 0   zolpidem  (AMBIEN ) 5 MG tablet Take 1-1.5 tablets (5-7.5 mg total) by mouth at bedtime as needed for sleep. 45  tablet 1   lacosamide  (VIMPAT ) 50 MG TABS tablet Take 1 tablet (50 mg total) by mouth 2 (two) times daily. 180 tablet 0   clonazePAM  (KLONOPIN ) 0.25 MG disintegrating tablet Take 1 tablet (0.25 mg total) by mouth at bedtime as needed for up to 10 doses (insomnia, anxiety, seizure). For Seizure ONLY, can take 4 tablets for seizure lasting more than 2 minutes (Patient not taking: Reported on 11/07/2023) 10 tablet 0   No facility-administered medications prior to visit.    PAST MEDICAL HISTORY: Past Medical History:  Diagnosis Date   Allergy 1980's   Sulfa drugs and penecillin   Anxiety    Cataract 2021   Corrected   Chronic kidney disease 2024   Stage 3   Depression    Headache    SINUS   Hyperlipidemia    Hypertension 2021   Osteopenia    Osteopenia    Primary localized osteoarthritis of right knee    Seizures (HCC) 10/2023    PAST SURGICAL HISTORY: Past Surgical History:  Procedure Laterality Date   BREAST EXCISIONAL BIOPSY Right    benign cyst removal    CARPAL TUNNEL RELEASE Right 2015   Ortho in [place]   CHOLECYSTECTOMY N/A 02/08/2022   Procedure: LAPAROSCOPIC CHOLECYSTECTOMY WITH INTRAOPERATIVE CHOLANGIOGRAM;  Surgeon: Debby Hila, MD;  Location: [facility];  Service: General;  Laterality: N/A;   EYE SURGERY  12/2019   cataracts   FRACTURE SURGERY  2016   Rt.  leg   JOINT REPLACEMENT  09/2015   Right knee   LASIK     MENISCUS REPAIR Right 2011   Orthopedic in [facility]   TOTAL KNEE ARTHROPLASTY Right 09/20/2015   TOTAL KNEE ARTHROPLASTY Right 09/20/2015   Procedure: TOTAL KNEE ARTHROPLASTY;  Surgeon: Lamar Millman, MD;  Location: [facility] OR;  Service: Orthopedics;  Laterality: Right;   Trigger Thumb Right 2015   Done in [facility] at the same time as the carpal tunnel release   TUBAL LIGATION  1980    FAMILY HISTORY: Family History  Problem Relation Age of Onset   Heart disease Mother    Arthritis Mother    Heart failure Mother    Heart attack Mother    Anxiety  disorder Mother    Depression Mother    Hearing loss Mother    Varicose Veins Mother    Early death Father    Heart attack Father    Hearing loss Father    Heart disease Father    Hyperlipidemia Sister    Heart disease Sister    Hearing loss Sister    Arthritis Sister    Irregular heart beat Sister    Hypertension Sister    Hypertension Brother    Hyperlipidemia Brother    Heart attack Brother        Leisure centre manager (Brother)   Asthma Brother    Alcohol abuse Brother    Heart disease Brother    Early death Brother  Heart attack Brother    Depression Son    Alcohol abuse Maternal Aunt    Alcohol abuse Maternal Aunt     SOCIAL HISTORY: Social History   Socioeconomic History   Marital status: Married    Spouse name: Lawrence   Number of children: 1   Years of education: 17.5   Highest education level: Master's degree (e.g., MA, MS, MEng, MEd, MSW, MBA)  Occupational History   Occupation: retired    Comment: Comptroller  Tobacco Use   Smoking status: Never   Smokeless tobacco: Never  Vaping Use   Vaping status: Never Used  Substance and Sexual Activity   Alcohol use: Not Currently    Comment: previously 1 glass a month   Drug use: Never   Sexual activity: Yes    Birth control/protection: Post-menopausal  Other Topics Concern   Not on file  Social History Narrative   Reads paper, walks, housework errands. Caffeine use daily.   3 grands   Social Drivers of Corporate investment banker Strain: Low Risk  (08/02/2023)   Overall Financial Resource Strain (CARDIA)    Difficulty of Paying Living Expenses: Not hard at all  Food Insecurity: No Food Insecurity (10/23/2023)   Hunger Vital Sign    Worried About Running Out of Food in the Last Year: Never true    Ran Out of Food in the Last Year: Never true  Transportation Needs: No Transportation Needs (10/23/2023)   PRAPARE - Administrator, Civil Service (Medical): No    Lack of Transportation (Non-Medical): No   Physical Activity: Insufficiently Active (08/02/2023)   Exercise Vital Sign    Days of Exercise per Week: 2 days    Minutes of Exercise per Session: 20 min  Stress: No Stress Concern Present (08/02/2023)   [employer] of Occupational Health - Occupational Stress Questionnaire    Feeling of Stress: Only a little  Recent Concern: Stress - Stress Concern Present (05/12/2023)   [employer] of Occupational Health - Occupational Stress Questionnaire    Feeling of Stress : To some extent  Social Connections: Moderately Integrated (10/23/2023)   Social Connection and Isolation Panel    Frequency of Communication with Friends and Family: Once a week    Frequency of Social Gatherings with Friends and Family: Once a week    Attends Religious Services: 1 to 4 times per year    Active Member of [employer] or Organizations: No    Attends Engineer, structural: 1 to 4 times per year    Marital Status: Married  Recent Concern: Social Connections - Moderately Isolated (08/02/2023)   Social Connection and Isolation Panel    Frequency of Communication with Friends and Family: Once a week    Frequency of Social Gatherings with Friends and Family: Once a week    Attends Religious Services: 1 to 4 times per year    Active Member of [employer] or Organizations: No    Attends Engineer, structural: Not on file    Marital Status: Married  Catering manager Violence: Not At Risk (10/23/2023)   Humiliation, Afraid, Rape, and Kick questionnaire    Fear of Current or Ex-Partner: No    Emotionally Abused: No    Physically Abused: No    Sexually Abused: No    PHYSICAL EXAM  GENERAL EXAM/CONSTITUTIONAL: Vitals:  Vitals:   11/07/23 0935  BP: 134/81  Pulse: 96  SpO2: 98%  Weight: 190 lb (86.2 kg)  Height: 5' 2 (  1.575 m)   Body mass index is 34.75 kg/m. Wt Readings from Last 3 Encounters:  11/07/23 190 lb (86.2 kg)  11/06/23 186 lb 8 oz (84.6 kg)  10/22/23 184 lb (83.5 kg)   Patient is  in no distress; well developed, nourished and groomed; neck is supple  MUSCULOSKELETAL: Gait, strength, tone, movements noted in Neurologic exam below  NEUROLOGIC: MENTAL STATUS:      No data to display         awake, alert, oriented to person, place and time recent and remote memory intact normal attention and concentration language fluent, comprehension intact, naming intact fund of knowledge appropriate  CRANIAL NERVE:  2nd, 3rd, 4th, 6th - Visual fields full to confrontation, extraocular muscles intact, no nystagmus 5th - facial sensation symmetric 7th - facial strength symmetric 8th - hearing intact 9th - palate elevates symmetrically, uvula midline 11th - shoulder shrug symmetric 12th - tongue protrusion midline  MOTOR:  normal bulk and tone, full strength in the BUE, BLE  SENSORY:  normal and symmetric to light touch  COORDINATION:  finger-nose-finger, fine finger movements normal. Resting and action tremors noted.   GAIT/STATION:  normal   DIAGNOSTIC DATA (LABS, IMAGING, TESTING) - I reviewed patient records, labs, notes, testing and imaging myself where available.  Lab Results  Component Value Date   WBC 9.4 10/26/2023   HGB 11.6 (L) 10/26/2023   HCT 36.1 10/26/2023   MCV 83.2 10/26/2023   PLT 407 (H) 10/26/2023      Component Value Date/Time   NA 142 10/26/2023 0535   K 4.1 10/26/2023 0535   CL 111 10/26/2023 0535   CO2 16 (L) 10/26/2023 0535   GLUCOSE 103 (H) 10/26/2023 0535   BUN 30 (H) 10/26/2023 0535   BUN 15 06/21/2014 0000   CREATININE 0.95 10/26/2023 0535   CREATININE 1.10 (H) 06/30/2021 0000   CALCIUM  8.5 (L) 10/26/2023 0535   PROT 7.4 10/17/2023 1801   ALBUMIN 3.6 10/17/2023 1801   AST 52 (H) 10/17/2023 1801   ALT 32 10/17/2023 1801   ALKPHOS 121 10/17/2023 1801   BILITOT 0.7 10/17/2023 1801   GFRNONAA >60 10/26/2023 0535   GFRNONAA 46 (L) 06/30/2020 1129   GFRAA 53 (L) 06/30/2020 1129   Lab Results  Component Value Date    CHOL 155 05/28/2023   HDL 71.50 05/28/2023   LDLCALC 62 05/28/2023   TRIG 110.0 05/28/2023   Lab Results  Component Value Date   HGBA1C 6.3 05/28/2023   Lab Results  Component Value Date   VITAMINB12 192 10/23/2023   Lab Results  Component Value Date   TSH 0.709 10/23/2023    Overnight EEG 10/24/2023 - Continuous rhythmic delta slow, generalized   Routine EEG 10/26/2023 Normal   MRI Brain 10/24/2023 1. No evidence of an acute intracranial abnormality. 2. No specific seizure etiology is identified. 3. Minor chronic small vessel ischemic changes within the pons. 4. Mild cerebral atrophy. 5. Mild-to-moderate cerebellar atrophy.  I personally reviewed brain Images and previous EEG reports.   ASSESSMENT AND PLAN  78 y.o. year old female  with history of anxiety, depression, hypertension, hyperlipidemia, chronic insomnia who is presenting after a provoked seizure in the setting of THC use.  Her EEG did not show any epileptiform discharges, there was some diffuse slowing but it normalized.  MRI did not show any focal abnormality.  She was started on Vimpat  50 mg twice daily. Plan will be for patient to continue with Vimpat  50 mg twice  daily for total of 20-month, if no additional seizures we will plan to discontinue.  Advised patient and husband to contact me if any breakthrough seizure or any other concerns.   1. Provoked seizure [facility])     Patient Instructions  Continue with Vimpat  50 mg twice daily Continue with your other medications Follow-up in 6 months, if no additional seizures, we will plan to discontinue Vimpat  Driving restriction for total of 6 months Return sooner if worse   Per [place]  DMV statutes, patients with seizures are not allowed to drive until they have been seizure-free for six months.  Other recommendations include using caution when using heavy equipment or power tools. Avoid working on ladders or at heights. Take showers instead of baths.  Do not  swim alone.  Ensure the water temperature is not too high on the home water heater. Do not go swimming alone. Do not lock yourself in a room alone (i.e. bathroom). When caring for infants or small children, sit down when holding, feeding, or changing them to minimize risk of injury to the child in the event you have a seizure. Maintain good sleep hygiene. Avoid alcohol.  Also recommend adequate sleep, hydration, good diet and minimize stress.   During the Seizure  - First, ensure adequate ventilation and place patients on the floor on their left side  Loosen clothing around the neck and ensure the airway is patent. If the patient is clenching the teeth, do not force the mouth open with any object as this can cause severe damage - Remove all items from the surrounding that can be hazardous. The patient may be oblivious to what's happening and may not even know what he or she is doing. If the patient is confused and wandering, either gently guide him/her away and block access to outside areas - Reassure the individual and be comforting - Call 911. In most cases, the seizure ends before EMS arrives. However, there are cases when seizures may last over 3 to 5 minutes. Or the individual may have developed breathing difficulties or severe injuries. If a pregnant patient or a person with diabetes develops a seizure, it is prudent to call an ambulance. - Finally, if the patient does not regain full consciousness, then call EMS. Most patients will remain confused for about 45 to 90 minutes after a seizure, so you must use judgment in calling for help. - Avoid restraints but make sure the patient is in a bed with padded side rails - Place the individual in a lateral position with the neck slightly flexed; this will help the saliva drain from the mouth and prevent the tongue from falling backward - Remove all nearby furniture and other hazards from the area - Provide verbal assurance as the individual is  regaining consciousness - Provide the patient with privacy if possible - Call for help and start treatment as ordered by the caregiver   After the Seizure (Postictal Stage)  After a seizure, most patients experience confusion, fatigue, muscle pain and/or a headache. Thus, one should permit the individual to sleep. For the next few days, reassurance is essential. Being calm and helping reorient the person is also of importance.  Most seizures are painless and end spontaneously. Seizures are not harmful to others but can lead to complications such as stress on the lungs, brain and the heart. Individuals with prior lung problems may develop labored breathing and respiratory distress.    Discussed Patients with epilepsy have a small risk of sudden  unexpected death, a condition referred to as sudden unexpected death in epilepsy (SUDEP). SUDEP is defined specifically as the sudden, unexpected, witnessed or unwitnessed, nontraumatic and nondrowning death in patients with epilepsy with or without evidence for a seizure, and excluding documented status epilepticus, in which post mortem examination does not reveal a structural or toxicologic cause for death     No orders of the defined types were placed in this encounter.   Meds ordered this encounter  Medications   lacosamide  (VIMPAT ) 50 MG TABS tablet    Sig: Take 1 tablet (50 mg total) by mouth 2 (two) times daily.    Dispense:  180 tablet    Refill:  2    Return in about 6 months (around 05/06/2024).  I have spent a total of 60 minutes dedicated to this patient today, preparing to see patient, performing a medically appropriate examination and evaluation, ordering tests and/or medications and procedures, and counseling and educating the patient/family/caregiver; independently interpreting result and communicating results to the family/patient/caregiver; and documenting clinical information in the electronic medical record.   Pastor Falling, MD  11/07/2023, 10:10 AM  [facility] [address], [address] [place], [place] [address] [phone]

## 2023-11-07 NOTE — Patient Instructions (Addendum)
 Continue with Vimpat  50 mg twice daily Continue with your other medications Follow-up in 6 months, if no additional seizures, we will plan to discontinue Vimpat  Driving restriction for total of 6 months Return sooner if worse

## 2023-11-08 ENCOUNTER — Telehealth: Payer: Self-pay | Admitting: *Deleted

## 2023-11-08 NOTE — Telephone Encounter (Signed)
 Copied from CRM #[id number]. Topic: General - Other >> Nov 08, 2023 11:23 AM [place] J wrote: Reason for CRM: Signe Eastern, a physical therapist from [facility] wanted to inform Dr. Wendolyn that the patient cancelled their evaluation today due to a stomach bug. Patient has diarrhea but no fever.   For any questions please call Signe at [phone].

## 2023-11-09 ENCOUNTER — Inpatient Hospital Stay: Admitting: Family Medicine

## 2023-11-09 ENCOUNTER — Telehealth: Payer: Self-pay | Admitting: Family Medicine

## 2023-11-09 NOTE — Telephone Encounter (Signed)
 HH FORMS received via fax Type of Form: [facility] charge sheet attached and placed in provider folder at front desk.

## 2023-11-10 ENCOUNTER — Other Ambulatory Visit (HOSPITAL_COMMUNITY): Payer: Self-pay

## 2023-11-12 ENCOUNTER — Other Ambulatory Visit: Payer: Self-pay | Admitting: Family Medicine

## 2023-11-12 ENCOUNTER — Other Ambulatory Visit (HOSPITAL_COMMUNITY): Payer: Self-pay

## 2023-11-13 ENCOUNTER — Telehealth: Payer: Self-pay

## 2023-11-13 ENCOUNTER — Other Ambulatory Visit: Payer: Self-pay

## 2023-11-13 ENCOUNTER — Other Ambulatory Visit: Payer: Self-pay | Admitting: Family Medicine

## 2023-11-13 ENCOUNTER — Other Ambulatory Visit (HOSPITAL_COMMUNITY): Payer: Self-pay

## 2023-11-13 MED ORDER — NAPROXEN 500 MG PO TABS
500.0000 mg | ORAL_TABLET | Freq: Two times a day (BID) | ORAL | 1 refills | Status: DC
  Filled 2023-11-13 – 2023-11-17 (×5): qty 30, 15d supply, fill #0

## 2023-11-13 NOTE — Telephone Encounter (Signed)
 Copied from CRM #[id number]. Topic: General - Other >> Nov 12, 2023  3:50 PM Aisha D wrote: Reason for CRM: Jim,PT, with [facility] stated that the pt is reporting loose stools after starting a new prescription. Pt's heart rate is 110 at rest, back pain 7/10, pt is using 4.0 lidocaine  spray and wanted to confirm if that was ok. CB for Signe [phone] (secure)  Please see msg from Home health and advise recommendations for patient

## 2023-11-14 ENCOUNTER — Other Ambulatory Visit: Payer: Self-pay

## 2023-11-14 ENCOUNTER — Other Ambulatory Visit (HOSPITAL_COMMUNITY): Payer: Self-pay

## 2023-11-14 ENCOUNTER — Other Ambulatory Visit: Payer: Self-pay | Admitting: Family Medicine

## 2023-11-14 MED ORDER — CYCLOBENZAPRINE HCL 5 MG PO TABS
5.0000 mg | ORAL_TABLET | Freq: Three times a day (TID) | ORAL | 0 refills | Status: DC | PRN
  Filled 2023-11-14: qty 15, 5d supply, fill #0

## 2023-11-14 NOTE — Telephone Encounter (Signed)
 Pt states mainly back pain is the same as always and won't go away and bothering her more than usual; has appt in a few weeks, pt needing refills on Flexeril  and Naproxen  in the next two days she will be out. Hard to do the PT in this much pain.

## 2023-11-15 ENCOUNTER — Other Ambulatory Visit (HOSPITAL_COMMUNITY): Payer: Self-pay

## 2023-11-16 ENCOUNTER — Other Ambulatory Visit: Payer: Self-pay

## 2023-11-16 ENCOUNTER — Other Ambulatory Visit (HOSPITAL_COMMUNITY): Payer: Self-pay

## 2023-11-17 ENCOUNTER — Other Ambulatory Visit (HOSPITAL_COMMUNITY): Payer: Self-pay

## 2023-11-19 ENCOUNTER — Telehealth: Payer: Self-pay

## 2023-11-19 NOTE — Telephone Encounter (Signed)
 Copied from CRM #[id number]. Topic: Clinical - Home Health Verbal Orders >> Nov 19, 2023  3:44 PM Mercedes MATSU wrote: Caller/Agency: Signe Rosan [facility] Callback Number: [phone] secure line Service Requested:  Frequency:  Any new concerns about the patient? Yes BP was 150/80, upon standing 130/80, with possible minor symptoms of light headedness. Pt reports that her low back pain shifted from a dull ache on the right side, to more of a sharp pain in the center, she is up to about a 6/10 pain wise. She reports no trauma, she is out of her naproxen  needs a refill. New sore in her mouth on left cheek, in about a day of two using peroxide and water combo for treatment.   Called pt to sch OV with another provider due to PCP being out of office this week. Pt refused and stated would rather wait til appt on 11/27/23 to see PCP.

## 2023-11-20 ENCOUNTER — Ambulatory Visit: Admitting: Family Medicine

## 2023-11-23 ENCOUNTER — Other Ambulatory Visit (HOSPITAL_COMMUNITY): Payer: Self-pay

## 2023-11-23 ENCOUNTER — Other Ambulatory Visit: Payer: Self-pay | Admitting: Family Medicine

## 2023-11-23 ENCOUNTER — Other Ambulatory Visit: Payer: Self-pay

## 2023-11-27 ENCOUNTER — Other Ambulatory Visit (HOSPITAL_COMMUNITY): Payer: Self-pay

## 2023-11-27 ENCOUNTER — Encounter: Payer: Self-pay | Admitting: Family Medicine

## 2023-11-27 ENCOUNTER — Ambulatory Visit: Admitting: Family Medicine

## 2023-11-27 VITALS — BP 138/80 | Temp 97.9°F | Ht 62.0 in | Wt 186.0 lb

## 2023-11-27 DIAGNOSIS — R569 Unspecified convulsions: Secondary | ICD-10-CM

## 2023-11-27 DIAGNOSIS — L989 Disorder of the skin and subcutaneous tissue, unspecified: Secondary | ICD-10-CM

## 2023-11-27 DIAGNOSIS — M5441 Lumbago with sciatica, right side: Secondary | ICD-10-CM

## 2023-11-27 DIAGNOSIS — K219 Gastro-esophageal reflux disease without esophagitis: Secondary | ICD-10-CM

## 2023-11-27 DIAGNOSIS — Z79899 Other long term (current) drug therapy: Secondary | ICD-10-CM

## 2023-11-27 DIAGNOSIS — R7303 Prediabetes: Secondary | ICD-10-CM

## 2023-11-27 DIAGNOSIS — I10 Essential (primary) hypertension: Secondary | ICD-10-CM

## 2023-11-27 DIAGNOSIS — R197 Diarrhea, unspecified: Secondary | ICD-10-CM | POA: Diagnosis not present

## 2023-11-27 LAB — CBC WITH DIFFERENTIAL/PLATELET
Basophils Absolute: 0.1 K/uL (ref 0.0–0.1)
Basophils Relative: 0.9 % (ref 0.0–3.0)
Eosinophils Absolute: 0.1 K/uL (ref 0.0–0.7)
Eosinophils Relative: 1.7 % (ref 0.0–5.0)
HCT: 35.1 % — ABNORMAL LOW (ref 36.0–46.0)
Hemoglobin: 11.5 g/dL — ABNORMAL LOW (ref 12.0–15.0)
Lymphocytes Relative: 25.1 % (ref 12.0–46.0)
Lymphs Abs: 2 K/uL (ref 0.7–4.0)
MCHC: 32.8 g/dL (ref 30.0–36.0)
MCV: 83.9 fl (ref 78.0–100.0)
Monocytes Absolute: 0.5 K/uL (ref 0.1–1.0)
Monocytes Relative: 6.5 % (ref 3.0–12.0)
Neutro Abs: 5.3 K/uL (ref 1.4–7.7)
Neutrophils Relative %: 65.8 % (ref 43.0–77.0)
Platelets: 468 K/uL — ABNORMAL HIGH (ref 150.0–400.0)
RBC: 4.18 Mil/uL (ref 3.87–5.11)
RDW: 17.8 % — ABNORMAL HIGH (ref 11.5–15.5)
WBC: 8.1 K/uL (ref 4.0–10.5)

## 2023-11-27 LAB — HEMOGLOBIN A1C: Hgb A1c MFr Bld: 6.1 % (ref 4.6–6.5)

## 2023-11-27 MED ORDER — CYCLOBENZAPRINE HCL 5 MG PO TABS
5.0000 mg | ORAL_TABLET | Freq: Three times a day (TID) | ORAL | 1 refills | Status: DC | PRN
  Filled 2023-11-27: qty 15, 5d supply, fill #0
  Filled 2023-12-02: qty 15, 5d supply, fill #1

## 2023-11-27 MED ORDER — IBUPROFEN 600 MG PO TABS
600.0000 mg | ORAL_TABLET | Freq: Three times a day (TID) | ORAL | 1 refills | Status: DC | PRN
  Filled 2023-11-27: qty 60, 20d supply, fill #0

## 2023-11-27 MED ORDER — OMEPRAZOLE 40 MG PO CPDR
40.0000 mg | DELAYED_RELEASE_CAPSULE | Freq: Every day | ORAL | 1 refills | Status: AC
  Filled 2023-11-27 – 2023-12-24 (×2): qty 90, 90d supply, fill #0

## 2023-11-27 NOTE — Patient Instructions (Signed)
?  abilify for sleep? ?belsomra ?

## 2023-11-27 NOTE — Progress Notes (Signed)
 Subjective:     Patient ID: [name], female    DOB: May 27, 1945, 78 y.o.   MRN: [id number]  Chief Complaint  Patient presents with   Medical Management of Chronic Issues    46mo follow up;     HPI Discussed the use of AI scribe software for clinical note transcription with the patient, who gave verbal consent to proceed.  History of Present Illness [name] is a 78 year old female who presents with persistent back tightness and medication side effects. She is accompanied by her husband, Ubaldo.  She experiences persistent back tightness, described as discomfort rather than pain, primarily on one side. This has been present for two to three weeks, causing difficulty standing up straight and increased tightness when on her feet for extended periods. She has tried cyclobenzaprine  (Flexeril ) three times a day as needed, which provided some relief.  She experienced severe side effects from naproxen , including uncontrollable diarrhea and 'pins and needles' sensations in her legs and hands, which resolved upon discontinuation. She has a history of gastrointestinal issues, with intermittent diarrhea occurring too frequently. She has not seen a gastroenterologist before.  She has a history of seizures and is currently on medication for this condition, with no recent seizures since her last hospitalization.  She reports difficulty with sleep and has been taking more Ambien  than prescribed, leading to her running out of the medication early. She also experienced excessive sedation with quetiapine  (Seroquel ), which she discontinued due to feeling 'whacked' the next day. She is concerned about her sleep issues and the impact on her daily life.  She mentions experiencing sweating and shaking after exertion, such as taking a shower or cooking, which she attributes to possible nervousness. Doesn't occur w/other activities. No chest pain, shortness of breath, or sweating while eating  or walking short distances.  She has a mole on her neck that is catching on clothing and requests a referral for removal. She also has other skin spots that she would like evaluated. Has had h/o skin ca  She has been using ibuprofen, which she finds effective for her joint issues, without gastrointestinal side effects. She previously used naproxen  but experienced adverse effects.    Health Maintenance Due  Topic Date Due   Medicare Annual Wellness (AWV)  08/08/2023   COVID-19 Vaccine (9 - 2025-26 season) 10/15/2023    Past Medical History:  Diagnosis Date   Allergy 1980's   Sulfa drugs and penecillin   Anxiety    Cataract 2021   Corrected   Chronic kidney disease 2024   Stage 3   Depression    Headache    SINUS   Hyperlipidemia    Hypertension 2021   Osteopenia    Osteopenia    Primary localized osteoarthritis of right knee    Seizures (HCC) 10/2023    Past Surgical History:  Procedure Laterality Date   BREAST EXCISIONAL BIOPSY Right    benign cyst removal    CARPAL TUNNEL RELEASE Right 2015   Ortho in [place]   CHOLECYSTECTOMY N/A 02/08/2022   Procedure: LAPAROSCOPIC CHOLECYSTECTOMY WITH INTRAOPERATIVE CHOLANGIOGRAM;  Surgeon: Debby Hila, MD;  Location: [facility];  Service: General;  Laterality: N/A;   EYE SURGERY  12/2019   cataracts   FRACTURE SURGERY  2016   Rt.  leg   JOINT REPLACEMENT  09/2015   Right knee   LASIK     MENISCUS REPAIR Right 2011   [facility]opedic in [facility]   TOTAL KNEE  ARTHROPLASTY Right 09/20/2015   TOTAL KNEE ARTHROPLASTY Right 09/20/2015   Procedure: TOTAL KNEE ARTHROPLASTY;  Surgeon: Lamar Millman, MD;  Location: [facility] OR;  Service: Orthopedics;  Laterality: Right;   Trigger Thumb Right 2015   Done in [facility] at the same time as the carpal tunnel release   TUBAL LIGATION  1980     Current Outpatient Medications:    amLODipine -benazepril  (LOTREL ) 5-20 MG capsule, Take 1 capsule by mouth daily., Disp: 90 capsule, Rfl: 3   atorvastatin   (LIPITOR) 20 MG tablet, Take 1 tablet (20 mg total) by mouth daily., Disp: 90 tablet, Rfl: 3   cyanocobalamin  1000 MCG tablet, Take 0.5 tablets (500 mcg total) by mouth daily., Disp: 90 tablet, Rfl: 0   ibuprofen (ADVIL) 600 MG tablet, Take 1 tablet (600 mg total) by mouth every 8 (eight) hours as needed., Disp: 60 tablet, Rfl: 1   lacosamide  (VIMPAT ) 50 MG TABS tablet, Take 1 tablet (50 mg total) by mouth 2 (two) times daily., Disp: 180 tablet, Rfl: 2   Multiple Vitamins-Minerals (MULTIVITAMIN WOMEN 50+) TABS, Take 1 tablet by mouth daily., Disp: , Rfl:    OVER THE COUNTER MEDICATION, Take 1 capsule by mouth every 12 (twelve) hours as needed (headache, cold/flu-like symptoms). OTC cold capsules, Disp: , Rfl:    raloxifene  (EVISTA ) 60 MG tablet, Take 1 tablet (60 mg total) by mouth daily., Disp: 90 tablet, Rfl: 3   triamterene -hydrochlorothiazide  (DYAZIDE ) 37.5-25 MG capsule, Take 1 capsule by mouth daily., Disp: 90 capsule, Rfl: 3   venlafaxine  XR (EFFEXOR -XR) 150 MG 24 hr capsule, Take 1 capsule (150 mg total) by mouth daily with breakfast., Disp: 90 capsule, Rfl: 0   zolpidem  (AMBIEN ) 5 MG tablet, Take 1-1.5 tablets (5-7.5 mg total) by mouth at bedtime as needed for sleep., Disp: 45 tablet, Rfl: 1   cyclobenzaprine  (FLEXERIL ) 5 MG tablet, Take 1 tablet (5 mg total) by mouth 3 (three) times daily as needed for muscle spasms., Disp: 15 tablet, Rfl: 1   omeprazole  (PRILOSEC) 40 MG capsule, Take 1 capsule (40 mg total) by mouth daily., Disp: 90 capsule, Rfl: 1  Allergies  Allergen Reactions   Penicillins Hives    Tolerates Cephalosporins    Prednisone  Other (See Comments)    Hyperactivity Sleep disturbance Confusion   Sulfa Antibiotics Hives        ROS neg/noncontributory except as noted HPI/below      Objective:     BP 138/80   Temp 97.9 F (36.6 C)   Ht 5' 2 (1.575 m)   Wt 186 lb (84.4 kg)   BMI 34.02 kg/m  Wt Readings from Last 3 Encounters:  11/27/23 186 lb (84.4 kg)   11/07/23 190 lb (86.2 kg)  11/06/23 186 lb 8 oz (84.6 kg)    Physical Exam   Gen: WDWN NAD HEENT: NCAT, conjunctiva not injected, sclera nonicteric NECK:  supple, no thyromegaly, no nodes, no carotid bruits CARDIAC: RRR, S1S2+, no murmur. DP 2+B LUNGS: CTAB. No wheezes ABDOMEN:  BS+, soft, NTND, No HSM, no masses EXT:  no edema MSK: no gross abnormalities.  NEURO: A&O x3.  CN II-XII intact.  PSYCH: normal mood. Good eye contact     Assessment & Plan:  Acute midline low back pain with right-sided sciatica  Seizure-like activity (HCC)  Diarrhea, unspecified type -     Ambulatory referral to Gastroenterology  Gastroesophageal reflux disease without esophagitis -     Omeprazole ; Take 1 capsule (40 mg total) by mouth daily.  Dispense:  90 capsule; Refill: 1  Skin lesion -     Ambulatory referral to Dermatology  Prediabetes -     Hemoglobin A1c  High risk medication use -     CBC with Differential/Platelet -     Comprehensive metabolic panel with GFR  Essential hypertension  Other orders -     Cyclobenzaprine  HCl; Take 1 tablet (5 mg total) by mouth 3 (three) times daily as needed for muscle spasms.  Dispense: 15 tablet; Refill: 1 -     Ibuprofen; Take 1 tablet (600 mg total) by mouth every 8 (eight) hours as needed.  Dispense: 60 tablet; Refill: 1  Assessment and Plan Assessment & Plan Chronic low back tightness and discomfort   Persistent low back tightness and discomfort for two to three weeks, not associated with pain. Flexeril  (cyclobenzaprine ) provides some relief. Physical therapy is anticipated to aid in stretching and strengthening. Awaiting evaluation by Dr. Joane. Refill Flexeril  prescription, continue physical therapy, and follow up with Dr. Joane for further evaluation. Ibuprofen 600mg  tid prn  Seizure disorder   Seizure disorder with no recent episodes. Currently on Vimpat  (lacosamide ) for seizure control. Driving restrictions in place for six months  post-seizure. Continue Vimpat  and adhere to driving restrictions.  Chronic intermittent diarrhea   Intermittent diarrhea exacerbated by naproxen  use. Differential includes irritable bowel syndrome. Referral to gastroenterology planned for evaluation.  Insomnia   Difficulty with sleep, previously managed with Ambien , but she has been taking more than prescribed. Seroquel  was effective but caused excessive daytime sedation. Discussed potential alternatives such as Abilify and Belsomra  with psychiatrist. Discuss alternative sleep aids with psychiatrist.  Gastroesophageal reflux disease (GERD)   GERD symptoms well-controlled with omeprazole . No breakthrough symptoms reported. Continue omeprazole  for management.  Skin lesions (mole on neck and other spots)   Mole on neck catching on clothing and other spots of concern. Referral to dermatology for evaluation and potential removal planned.  Prediabetes   Prediabetes noted.  Chronic anemia   Chronic anemia with fluctuating levels.  General Health Maintenance   Previous colonoscopy over eight years ago. Recent stool test by mail was normal. No current colonoscopy planned due to age.  Follow-up   Follow-up plans discussed for various conditions and referrals. Follow up with Dr. Joane on Thursday, schedule blood work to monitor kidney function and other parameters, and print after-visit summary.    Return in about 3 months (around 02/27/2024) for chronic follow-up.  Jenkins CHRISTELLA Carrel, MD

## 2023-11-28 ENCOUNTER — Ambulatory Visit: Payer: Self-pay | Admitting: Family Medicine

## 2023-11-28 LAB — COMPREHENSIVE METABOLIC PANEL WITH GFR
ALT: 16 U/L (ref 0–35)
AST: 21 U/L (ref 0–37)
Albumin: 4.4 g/dL (ref 3.5–5.2)
Alkaline Phosphatase: 114 U/L (ref 39–117)
BUN: 24 mg/dL — ABNORMAL HIGH (ref 6–23)
CO2: 24 meq/L (ref 19–32)
Calcium: 9.2 mg/dL (ref 8.4–10.5)
Chloride: 102 meq/L (ref 96–112)
Creatinine, Ser: 1.1 mg/dL (ref 0.40–1.20)
GFR: 47.94 mL/min — ABNORMAL LOW (ref >60.00)
Glucose, Bld: 96 mg/dL (ref 70–99)
Potassium: 4.3 meq/L (ref 3.5–5.1)
Sodium: 140 meq/L (ref 135–145)
Total Bilirubin: 0.3 mg/dL (ref 0.2–1.2)
Total Protein: 7.1 g/dL (ref 6.0–8.3)

## 2023-11-28 NOTE — Progress Notes (Signed)
 Labs stable

## 2023-11-29 ENCOUNTER — Ambulatory Visit: Admitting: Family Medicine

## 2023-11-29 VITALS — BP 130/80 | HR 99 | Ht 62.0 in | Wt 186.0 lb

## 2023-11-29 DIAGNOSIS — M79641 Pain in right hand: Secondary | ICD-10-CM | POA: Diagnosis not present

## 2023-11-29 DIAGNOSIS — M79642 Pain in left hand: Secondary | ICD-10-CM

## 2023-11-29 DIAGNOSIS — M25551 Pain in right hip: Secondary | ICD-10-CM | POA: Diagnosis not present

## 2023-11-29 NOTE — Progress Notes (Signed)
   I, [name] am a scribe for Dr. Artist Lloyd, MD.  [name] is a 78 y.o. female who presents to [facility] today for bilat hand pain and f/u from hospitalization. Pt was transported by EMS to ED on 9/3 for altered mental status.  Today, pt reports feel so so today. Woke up with night sweats this morning. Hands have been better. Should be wearing the braces more than she has been. They have been doing well except for rainy chilly weather.   She notes a fair amount of right lateral hip and low back pain.  She is receiving home health physical therapy for the hip and back pain which has been helpful.  Pertinent review of systems: No fevers or chills  Relevant historical information: Recent hospitalization   Exam:  BP 130/80   Pulse 99   Ht 5' 2 (1.575 m)   Wt 186 lb (84.4 kg)   SpO2 95%   BMI 34.02 kg/m  General: Well Developed, well nourished, and in no acute distress.   MSK: Right hip normal-appearing tender palpation greater trochanter.  Normal hip motion.  Mildly reduced strength       Assessment and Plan: 78 y.o. female with right lateral hip pain following a hospitalization due to acute metabolic encephalopathy.  She has deconditioned following this hospitalization and is doing exactly the right treatment of home health physical therapy to get better.  I think she is in good hands and doing exactly the right thing.  We could do a steroid injection in the greater trochanter if needed but for now I think she has the right idea.  Hands are continuing to improve with occupational therapy.  Watchful waiting for now.  Check back with me as needed.   PDMP not reviewed this encounter. No orders of the defined types were placed in this encounter.  No orders of the defined types were placed in this encounter.    Discussed warning signs or symptoms. Please see discharge instructions. Patient expresses  understanding.   The above documentation has been reviewed and is accurate and complete Artist Lloyd, M.D.

## 2023-11-29 NOTE — Patient Instructions (Signed)
Thank you for coming in today.   Advance activity as tolerated.  Check back as needed

## 2023-12-04 ENCOUNTER — Other Ambulatory Visit (HOSPITAL_COMMUNITY): Payer: Self-pay

## 2023-12-04 ENCOUNTER — Telehealth (HOSPITAL_COMMUNITY): Payer: Self-pay

## 2023-12-04 NOTE — Telephone Encounter (Signed)
 Patient called in and left voicemail. Per patient she recently has seizures. And her PCP believes it is the Zolpidem  5 mg. Patient per patient she is still taking the medication but has decreased dosage. Please reach out to patient when time allows.

## 2023-12-09 ENCOUNTER — Other Ambulatory Visit: Payer: Self-pay | Admitting: Family Medicine

## 2023-12-09 ENCOUNTER — Other Ambulatory Visit (HOSPITAL_COMMUNITY): Payer: Self-pay

## 2023-12-09 MED ORDER — CYCLOBENZAPRINE HCL 5 MG PO TABS
5.0000 mg | ORAL_TABLET | Freq: Three times a day (TID) | ORAL | 0 refills | Status: DC | PRN
  Filled 2023-12-09: qty 15, 5d supply, fill #0

## 2023-12-09 NOTE — Telephone Encounter (Signed)
 Please see how she is doing.  Can't stay on this

## 2023-12-10 ENCOUNTER — Other Ambulatory Visit (HOSPITAL_COMMUNITY): Payer: Self-pay

## 2023-12-10 NOTE — Telephone Encounter (Signed)
 Left VM for patient to return phone call to discuss and update on how's she feeling due not being able to be on Flexeril  much longer per Dr Wendolyn.

## 2023-12-12 ENCOUNTER — Telehealth (HOSPITAL_COMMUNITY): Payer: Self-pay

## 2023-12-12 NOTE — Telephone Encounter (Signed)
 Patient called in and left voicemail. Per patient she is having panic attacks due to not sleeping. Patein is requesting a phone call from her provider.

## 2023-12-13 ENCOUNTER — Other Ambulatory Visit (HOSPITAL_COMMUNITY): Payer: Self-pay | Admitting: Psychiatry

## 2023-12-13 ENCOUNTER — Other Ambulatory Visit (HOSPITAL_COMMUNITY): Payer: Self-pay

## 2023-12-14 ENCOUNTER — Other Ambulatory Visit (HOSPITAL_COMMUNITY): Payer: Self-pay

## 2023-12-16 ENCOUNTER — Emergency Department (HOSPITAL_COMMUNITY)

## 2023-12-16 ENCOUNTER — Inpatient Hospital Stay (HOSPITAL_COMMUNITY)
Admission: EM | Admit: 2023-12-16 | Discharge: 2023-12-23 | DRG: 641 | Disposition: A | Discharge status: Rehabilitation Facility | Attending: Family Medicine | Admitting: Family Medicine

## 2023-12-16 ENCOUNTER — Encounter (HOSPITAL_COMMUNITY): Payer: Self-pay

## 2023-12-16 ENCOUNTER — Observation Stay (HOSPITAL_COMMUNITY)

## 2023-12-16 ENCOUNTER — Other Ambulatory Visit: Payer: Self-pay

## 2023-12-16 DIAGNOSIS — G459 Transient cerebral ischemic attack, unspecified: Secondary | ICD-10-CM | POA: Diagnosis not present

## 2023-12-16 DIAGNOSIS — Z880 Allergy status to penicillin: Secondary | ICD-10-CM

## 2023-12-16 DIAGNOSIS — Z96651 Presence of right artificial knee joint: Secondary | ICD-10-CM | POA: Diagnosis present

## 2023-12-16 DIAGNOSIS — Z72821 Inadequate sleep hygiene: Secondary | ICD-10-CM | POA: Diagnosis not present

## 2023-12-16 DIAGNOSIS — R4182 Altered mental status, unspecified: Secondary | ICD-10-CM

## 2023-12-16 DIAGNOSIS — I10 Essential (primary) hypertension: Secondary | ICD-10-CM | POA: Diagnosis present

## 2023-12-16 DIAGNOSIS — Z8249 Family history of ischemic heart disease and other diseases of the circulatory system: Secondary | ICD-10-CM | POA: Diagnosis not present

## 2023-12-16 DIAGNOSIS — Z83438 Family history of other disorder of lipoprotein metabolism and other lipidemia: Secondary | ICD-10-CM

## 2023-12-16 DIAGNOSIS — G9349 Other encephalopathy: Secondary | ICD-10-CM | POA: Diagnosis present

## 2023-12-16 DIAGNOSIS — R253 Fasciculation: Secondary | ICD-10-CM | POA: Diagnosis present

## 2023-12-16 DIAGNOSIS — F0390 Unspecified dementia without behavioral disturbance: Secondary | ICD-10-CM | POA: Diagnosis not present

## 2023-12-16 DIAGNOSIS — E785 Hyperlipidemia, unspecified: Secondary | ICD-10-CM | POA: Diagnosis present

## 2023-12-16 DIAGNOSIS — I517 Cardiomegaly: Secondary | ICD-10-CM | POA: Diagnosis not present

## 2023-12-16 DIAGNOSIS — R0602 Shortness of breath: Secondary | ICD-10-CM | POA: Diagnosis not present

## 2023-12-16 DIAGNOSIS — J90 Pleural effusion, not elsewhere classified: Secondary | ICD-10-CM | POA: Diagnosis not present

## 2023-12-16 DIAGNOSIS — Z882 Allergy status to sulfonamides status: Secondary | ICD-10-CM

## 2023-12-16 DIAGNOSIS — R4701 Aphasia: Secondary | ICD-10-CM | POA: Diagnosis present

## 2023-12-16 DIAGNOSIS — I445 Left posterior fascicular block: Secondary | ICD-10-CM | POA: Diagnosis present

## 2023-12-16 DIAGNOSIS — F411 Generalized anxiety disorder: Secondary | ICD-10-CM | POA: Diagnosis present

## 2023-12-16 DIAGNOSIS — Z8673 Personal history of transient ischemic attack (TIA), and cerebral infarction without residual deficits: Secondary | ICD-10-CM

## 2023-12-16 DIAGNOSIS — E669 Obesity, unspecified: Secondary | ICD-10-CM | POA: Diagnosis present

## 2023-12-16 DIAGNOSIS — E861 Hypovolemia: Principal | ICD-10-CM | POA: Diagnosis present

## 2023-12-16 DIAGNOSIS — E538 Deficiency of other specified B group vitamins: Secondary | ICD-10-CM | POA: Diagnosis present

## 2023-12-16 DIAGNOSIS — R32 Unspecified urinary incontinence: Secondary | ICD-10-CM | POA: Diagnosis not present

## 2023-12-16 DIAGNOSIS — S199XXA Unspecified injury of neck, initial encounter: Secondary | ICD-10-CM | POA: Diagnosis not present

## 2023-12-16 DIAGNOSIS — G40909 Epilepsy, unspecified, not intractable, without status epilepticus: Secondary | ICD-10-CM | POA: Diagnosis present

## 2023-12-16 DIAGNOSIS — N179 Acute kidney failure, unspecified: Secondary | ICD-10-CM | POA: Diagnosis present

## 2023-12-16 DIAGNOSIS — M1711 Unilateral primary osteoarthritis, right knee: Secondary | ICD-10-CM | POA: Diagnosis present

## 2023-12-16 DIAGNOSIS — R569 Unspecified convulsions: Secondary | ICD-10-CM | POA: Diagnosis present

## 2023-12-16 DIAGNOSIS — M25512 Pain in left shoulder: Secondary | ICD-10-CM | POA: Diagnosis not present

## 2023-12-16 DIAGNOSIS — Z7982 Long term (current) use of aspirin: Secondary | ICD-10-CM

## 2023-12-16 DIAGNOSIS — G9389 Other specified disorders of brain: Secondary | ICD-10-CM | POA: Diagnosis present

## 2023-12-16 DIAGNOSIS — F22 Delusional disorders: Secondary | ICD-10-CM | POA: Diagnosis present

## 2023-12-16 DIAGNOSIS — I129 Hypertensive chronic kidney disease with stage 1 through stage 4 chronic kidney disease, or unspecified chronic kidney disease: Secondary | ICD-10-CM | POA: Diagnosis present

## 2023-12-16 DIAGNOSIS — F32A Depression, unspecified: Secondary | ICD-10-CM | POA: Diagnosis present

## 2023-12-16 DIAGNOSIS — N183 Chronic kidney disease, stage 3 unspecified: Secondary | ICD-10-CM | POA: Diagnosis present

## 2023-12-16 DIAGNOSIS — Z825 Family history of asthma and other chronic lower respiratory diseases: Secondary | ICD-10-CM

## 2023-12-16 DIAGNOSIS — R296 Repeated falls: Secondary | ICD-10-CM | POA: Diagnosis present

## 2023-12-16 DIAGNOSIS — W19XXXA Unspecified fall, initial encounter: Secondary | ICD-10-CM | POA: Diagnosis present

## 2023-12-16 DIAGNOSIS — I672 Cerebral atherosclerosis: Secondary | ICD-10-CM | POA: Diagnosis not present

## 2023-12-16 DIAGNOSIS — S2242XA Multiple fractures of ribs, left side, initial encounter for closed fracture: Secondary | ICD-10-CM | POA: Diagnosis present

## 2023-12-16 DIAGNOSIS — R0989 Other specified symptoms and signs involving the circulatory and respiratory systems: Secondary | ICD-10-CM | POA: Diagnosis not present

## 2023-12-16 DIAGNOSIS — T40715A Adverse effect of cannabis, initial encounter: Secondary | ICD-10-CM | POA: Diagnosis not present

## 2023-12-16 DIAGNOSIS — R29818 Other symptoms and signs involving the nervous system: Secondary | ICD-10-CM | POA: Diagnosis not present

## 2023-12-16 DIAGNOSIS — Z811 Family history of alcohol abuse and dependence: Secondary | ICD-10-CM | POA: Diagnosis not present

## 2023-12-16 DIAGNOSIS — M25561 Pain in right knee: Secondary | ICD-10-CM | POA: Diagnosis present

## 2023-12-16 DIAGNOSIS — M4802 Spinal stenosis, cervical region: Secondary | ICD-10-CM | POA: Diagnosis not present

## 2023-12-16 DIAGNOSIS — Z818 Family history of other mental and behavioral disorders: Secondary | ICD-10-CM | POA: Diagnosis not present

## 2023-12-16 DIAGNOSIS — E860 Dehydration: Secondary | ICD-10-CM | POA: Diagnosis present

## 2023-12-16 DIAGNOSIS — E872 Acidosis, unspecified: Secondary | ICD-10-CM | POA: Diagnosis present

## 2023-12-16 DIAGNOSIS — Z7409 Other reduced mobility: Secondary | ICD-10-CM | POA: Diagnosis present

## 2023-12-16 DIAGNOSIS — R531 Weakness: Secondary | ICD-10-CM

## 2023-12-16 DIAGNOSIS — R471 Dysarthria and anarthria: Secondary | ICD-10-CM | POA: Diagnosis present

## 2023-12-16 DIAGNOSIS — G4700 Insomnia, unspecified: Secondary | ICD-10-CM | POA: Diagnosis present

## 2023-12-16 DIAGNOSIS — R918 Other nonspecific abnormal finding of lung field: Secondary | ICD-10-CM | POA: Diagnosis not present

## 2023-12-16 DIAGNOSIS — E876 Hypokalemia: Secondary | ICD-10-CM | POA: Diagnosis present

## 2023-12-16 DIAGNOSIS — M47812 Spondylosis without myelopathy or radiculopathy, cervical region: Secondary | ICD-10-CM | POA: Diagnosis not present

## 2023-12-16 DIAGNOSIS — R410 Disorientation, unspecified: Secondary | ICD-10-CM | POA: Diagnosis not present

## 2023-12-16 DIAGNOSIS — Z0389 Encounter for observation for other suspected diseases and conditions ruled out: Secondary | ICD-10-CM | POA: Diagnosis not present

## 2023-12-16 DIAGNOSIS — R5381 Other malaise: Secondary | ICD-10-CM | POA: Diagnosis present

## 2023-12-16 DIAGNOSIS — J9811 Atelectasis: Secondary | ICD-10-CM | POA: Diagnosis not present

## 2023-12-16 DIAGNOSIS — Z888 Allergy status to other drugs, medicaments and biological substances status: Secondary | ICD-10-CM

## 2023-12-16 DIAGNOSIS — R0781 Pleurodynia: Secondary | ICD-10-CM | POA: Diagnosis not present

## 2023-12-16 DIAGNOSIS — K529 Noninfective gastroenteritis and colitis, unspecified: Secondary | ICD-10-CM | POA: Diagnosis present

## 2023-12-16 DIAGNOSIS — M8580 Other specified disorders of bone density and structure, unspecified site: Secondary | ICD-10-CM | POA: Diagnosis present

## 2023-12-16 DIAGNOSIS — D72829 Elevated white blood cell count, unspecified: Secondary | ICD-10-CM | POA: Diagnosis present

## 2023-12-16 DIAGNOSIS — N189 Chronic kidney disease, unspecified: Secondary | ICD-10-CM | POA: Diagnosis present

## 2023-12-16 DIAGNOSIS — R42 Dizziness and giddiness: Secondary | ICD-10-CM | POA: Diagnosis present

## 2023-12-16 DIAGNOSIS — F3340 Major depressive disorder, recurrent, in remission, unspecified: Secondary | ICD-10-CM | POA: Diagnosis present

## 2023-12-16 DIAGNOSIS — M25551 Pain in right hip: Secondary | ICD-10-CM | POA: Diagnosis not present

## 2023-12-16 DIAGNOSIS — Z8261 Family history of arthritis: Secondary | ICD-10-CM

## 2023-12-16 DIAGNOSIS — M4312 Spondylolisthesis, cervical region: Secondary | ICD-10-CM | POA: Diagnosis not present

## 2023-12-16 DIAGNOSIS — Z79899 Other long term (current) drug therapy: Secondary | ICD-10-CM

## 2023-12-16 DIAGNOSIS — Z822 Family history of deafness and hearing loss: Secondary | ICD-10-CM

## 2023-12-16 DIAGNOSIS — Z6834 Body mass index (BMI) 34.0-34.9, adult: Secondary | ICD-10-CM

## 2023-12-16 DIAGNOSIS — R000 Tachycardia, unspecified: Secondary | ICD-10-CM | POA: Diagnosis present

## 2023-12-16 DIAGNOSIS — F1290 Cannabis use, unspecified, uncomplicated: Secondary | ICD-10-CM | POA: Diagnosis present

## 2023-12-16 DIAGNOSIS — I639 Cerebral infarction, unspecified: Secondary | ICD-10-CM | POA: Diagnosis not present

## 2023-12-16 DIAGNOSIS — Z9049 Acquired absence of other specified parts of digestive tract: Secondary | ICD-10-CM

## 2023-12-16 DIAGNOSIS — R7303 Prediabetes: Secondary | ICD-10-CM | POA: Diagnosis present

## 2023-12-16 DIAGNOSIS — G9341 Metabolic encephalopathy: Secondary | ICD-10-CM | POA: Diagnosis present

## 2023-12-16 DIAGNOSIS — R4189 Other symptoms and signs involving cognitive functions and awareness: Secondary | ICD-10-CM | POA: Diagnosis present

## 2023-12-16 DIAGNOSIS — F419 Anxiety disorder, unspecified: Secondary | ICD-10-CM | POA: Diagnosis present

## 2023-12-16 DIAGNOSIS — R7989 Other specified abnormal findings of blood chemistry: Secondary | ICD-10-CM | POA: Diagnosis not present

## 2023-12-16 LAB — I-STAT CHEM 8, ED
BUN: 23 mg/dL (ref 8–23)
Calcium, Ion: 1 mmol/L — ABNORMAL LOW (ref 1.15–1.40)
Chloride: 101 mmol/L (ref 98–111)
Creatinine, Ser: 2.1 mg/dL — ABNORMAL HIGH (ref 0.44–1.00)
Glucose, Bld: 128 mg/dL — ABNORMAL HIGH (ref 70–99)
HCT: 39 % (ref 36.0–46.0)
Hemoglobin: 13.3 g/dL (ref 12.0–15.0)
Potassium: 3.2 mmol/L — ABNORMAL LOW (ref 3.5–5.1)
Sodium: 133 mmol/L — ABNORMAL LOW (ref 135–145)
TCO2: 18 mmol/L — ABNORMAL LOW (ref 22–32)

## 2023-12-16 LAB — T4, FREE: Free T4: 0.98 ng/dL (ref 0.61–1.12)

## 2023-12-16 LAB — BETA-HYDROXYBUTYRIC ACID
Beta-Hydroxybutyric Acid: 0.84 mmol/L — ABNORMAL HIGH (ref 0.05–0.27)
Beta-Hydroxybutyric Acid: 1.41 mmol/L — ABNORMAL HIGH (ref 0.05–0.27)

## 2023-12-16 LAB — I-STAT CG4 LACTIC ACID, ED
Lactic Acid, Venous: 2 mmol/L (ref 0.5–1.9)
Lactic Acid, Venous: 3.1 mmol/L (ref 0.5–1.9)
Lactic Acid, Venous: 1.2 mmol/L (ref 0.5–1.9)
Lactic Acid, Venous: 1 mmol/L (ref 0.5–1.9)

## 2023-12-16 LAB — TROPONIN I (HIGH SENSITIVITY)
Troponin I (High Sensitivity): 18 ng/L — ABNORMAL HIGH (ref <18)
Troponin I (High Sensitivity): 19 ng/L — ABNORMAL HIGH (ref <18)

## 2023-12-16 LAB — COMPREHENSIVE METABOLIC PANEL WITH GFR
ALT: 25 U/L (ref 0–44)
AST: 33 U/L (ref 15–41)
Albumin: 4.1 g/dL (ref 3.5–5.0)
Alkaline Phosphatase: 103 U/L (ref 38–126)
Anion gap: 24 — ABNORMAL HIGH (ref 5–15)
BUN: 22 mg/dL (ref 8–23)
CO2: 15 mmol/L — ABNORMAL LOW (ref 22–32)
Calcium: 9.3 mg/dL (ref 8.9–10.3)
Chloride: 95 mmol/L — ABNORMAL LOW (ref 98–111)
Creatinine, Ser: 2.04 mg/dL — ABNORMAL HIGH (ref 0.44–1.00)
GFR, Estimated: 24 mL/min — ABNORMAL LOW (ref >60)
Glucose, Bld: 133 mg/dL — ABNORMAL HIGH (ref 70–99)
Potassium: 3.4 mmol/L — ABNORMAL LOW (ref 3.5–5.1)
Sodium: 134 mmol/L — ABNORMAL LOW (ref 135–145)
Total Bilirubin: 1.1 mg/dL (ref 0.0–1.2)
Total Protein: 7.5 g/dL (ref 6.5–8.1)

## 2023-12-16 LAB — DIFFERENTIAL
Abs Immature Granulocytes: 0.09 K/uL — ABNORMAL HIGH (ref 0.00–0.07)
Basophils Absolute: 0 K/uL (ref 0.0–0.1)
Basophils Relative: 0 %
Eosinophils Absolute: 0 K/uL (ref 0.0–0.5)
Eosinophils Relative: 0 %
Immature Granulocytes: 1 %
Lymphocytes Relative: 7 %
Lymphs Abs: 1 K/uL (ref 0.7–4.0)
Monocytes Absolute: 0.7 K/uL (ref 0.1–1.0)
Monocytes Relative: 5 %
Neutro Abs: 12.6 K/uL — ABNORMAL HIGH (ref 1.7–7.7)
Neutrophils Relative %: 87 %

## 2023-12-16 LAB — BASIC METABOLIC PANEL WITH GFR
Anion gap: 16 — ABNORMAL HIGH (ref 5–15)
BUN: 24 mg/dL — ABNORMAL HIGH (ref 8–23)
CO2: 19 mmol/L — ABNORMAL LOW (ref 22–32)
Calcium: 8.4 mg/dL — ABNORMAL LOW (ref 8.9–10.3)
Chloride: 100 mmol/L (ref 98–111)
Creatinine, Ser: 1.75 mg/dL — ABNORMAL HIGH (ref 0.44–1.00)
GFR, Estimated: 29 mL/min — ABNORMAL LOW (ref >60)
Glucose, Bld: 105 mg/dL — ABNORMAL HIGH (ref 70–99)
Potassium: 3 mmol/L — ABNORMAL LOW (ref 3.5–5.1)
Sodium: 135 mmol/L (ref 135–145)

## 2023-12-16 LAB — CBC
HCT: 38.3 % (ref 36.0–46.0)
Hemoglobin: 12.4 g/dL (ref 12.0–15.0)
MCH: 27.1 pg (ref 26.0–34.0)
MCHC: 32.4 g/dL (ref 30.0–36.0)
MCV: 83.8 fL (ref 80.0–100.0)
Platelets: 505 K/uL — ABNORMAL HIGH (ref 150–400)
RBC: 4.57 MIL/uL (ref 3.87–5.11)
RDW: 16.1 % — ABNORMAL HIGH (ref 11.5–15.5)
WBC: 14.3 K/uL — ABNORMAL HIGH (ref 4.0–10.5)
nRBC: 0 % (ref 0.0–0.2)

## 2023-12-16 LAB — TSH: TSH: 1.444 u[IU]/mL (ref 0.350–4.500)

## 2023-12-16 LAB — APTT: aPTT: <22 s — ABNORMAL LOW (ref 24–36)

## 2023-12-16 LAB — URINALYSIS, ROUTINE W REFLEX MICROSCOPIC
Bacteria, UA: NONE SEEN
Bilirubin Urine: NEGATIVE
Glucose, UA: NEGATIVE mg/dL
Hgb urine dipstick: NEGATIVE
Ketones, ur: 5 mg/dL — AB
Leukocytes,Ua: NEGATIVE
Nitrite: NEGATIVE
Protein, ur: 30 mg/dL — AB
Specific Gravity, Urine: 1.016 (ref 1.005–1.030)
pH: 5 (ref 5.0–8.0)

## 2023-12-16 LAB — HEMOGLOBIN AND HEMATOCRIT, BLOOD
HCT: 31.5 % — ABNORMAL LOW (ref 36.0–46.0)
Hemoglobin: 10.2 g/dL — ABNORMAL LOW (ref 12.0–15.0)

## 2023-12-16 LAB — ETHANOL: Alcohol, Ethyl (B): <15 mg/dL (ref <15)

## 2023-12-16 LAB — CK: Total CK: 230 U/L (ref 38–234)

## 2023-12-16 LAB — MAGNESIUM
Magnesium: 2.1 mg/dL (ref 1.7–2.4)
Magnesium: 1.9 mg/dL (ref 1.7–2.4)

## 2023-12-16 LAB — D-DIMER, QUANTITATIVE: D-Dimer, Quant: 1.83 ug{FEU}/mL — ABNORMAL HIGH (ref 0.00–0.50)

## 2023-12-16 LAB — CBG MONITORING, ED: Glucose-Capillary: 141 mg/dL — ABNORMAL HIGH (ref 70–99)

## 2023-12-16 LAB — PROTIME-INR
INR: 1.1 (ref 0.8–1.2)
Prothrombin Time: 14.4 s (ref 11.4–15.2)

## 2023-12-16 LAB — HEMOGLOBIN A1C
Hgb A1c MFr Bld: 5.7 % — ABNORMAL HIGH (ref 4.8–5.6)
Mean Plasma Glucose: 116.89 mg/dL

## 2023-12-16 MED ORDER — SODIUM CHLORIDE 0.9 % IV BOLUS
1000.0000 mL | Freq: Once | INTRAVENOUS | Status: AC
Start: 2023-12-16 — End: 2023-12-16
  Administered 2023-12-16: 1000 mL via INTRAVENOUS

## 2023-12-16 MED ORDER — ATORVASTATIN CALCIUM 10 MG PO TABS
20.0000 mg | ORAL_TABLET | Freq: Every day | ORAL | Status: DC
  Administered 2023-12-16: 20 mg via ORAL
  Filled 2023-12-16: qty 2

## 2023-12-16 MED ORDER — SODIUM CHLORIDE 0.9 % IV BOLUS: 500.0000 mL | Freq: Once | INTRAVENOUS | Status: DC

## 2023-12-16 MED ORDER — SODIUM CHLORIDE 0.9% FLUSH
3.0000 mL | Freq: Once | INTRAVENOUS | Status: AC
  Administered 2023-12-16: 3 mL via INTRAVENOUS

## 2023-12-16 MED ORDER — SODIUM CHLORIDE 0.9 % IV BOLUS
1000.0000 mL | Freq: Once | INTRAVENOUS | Status: AC
  Administered 2023-12-16: 1000 mL via INTRAVENOUS

## 2023-12-16 MED ORDER — SODIUM CHLORIDE 0.9% FLUSH
3.0000 mL | Freq: Two times a day (BID) | INTRAVENOUS | Status: DC
  Administered 2023-12-16 – 2023-12-23 (×12): 3 mL via INTRAVENOUS

## 2023-12-16 MED ORDER — HEPARIN SODIUM (PORCINE) 5000 UNIT/ML IJ SOLN
5000.0000 [IU] | Freq: Three times a day (TID) | INTRAMUSCULAR | Status: DC
  Administered 2023-12-16 – 2023-12-23 (×21): 5000 [IU] via SUBCUTANEOUS
  Filled 2023-12-16 (×21): qty 1

## 2023-12-16 MED ORDER — VENLAFAXINE HCL ER 75 MG PO CP24
150.0000 mg | ORAL_CAPSULE | Freq: Every day | ORAL | Status: DC
  Administered 2023-12-17 – 2023-12-23 (×7): 150 mg via ORAL
  Filled 2023-12-16 (×7): qty 2

## 2023-12-16 MED ORDER — LACOSAMIDE 50 MG PO TABS
50.0000 mg | ORAL_TABLET | Freq: Two times a day (BID) | ORAL | Status: DC
Start: 2023-12-16 — End: 2023-12-17
  Administered 2023-12-16: 50 mg via ORAL
  Filled 2023-12-16: qty 1

## 2023-12-16 MED ORDER — SODIUM CHLORIDE 0.9 % IV SOLN
INTRAVENOUS | Status: AC
Start: 2023-12-16 — End: 2023-12-17

## 2023-12-16 MED ORDER — ATORVASTATIN CALCIUM 40 MG PO TABS
40.0000 mg | ORAL_TABLET | Freq: Every day | ORAL | Status: DC
  Administered 2023-12-16 – 2023-12-23 (×8): 40 mg via ORAL
  Filled 2023-12-16 (×8): qty 1

## 2023-12-16 MED ORDER — LACTATED RINGERS IV BOLUS
500.0000 mL | Freq: Once | INTRAVENOUS | Status: AC
  Administered 2023-12-16: 500 mL via INTRAVENOUS

## 2023-12-16 MED ORDER — ASPIRIN 81 MG PO TBEC
81.0000 mg | DELAYED_RELEASE_TABLET | Freq: Every day | ORAL | Status: DC
  Administered 2023-12-17 – 2023-12-23 (×7): 81 mg via ORAL
  Filled 2023-12-16 (×7): qty 1

## 2023-12-16 MED ORDER — HYDRALAZINE HCL 20 MG/ML IJ SOLN: 5.0000 mg | Freq: Four times a day (QID) | INTRAMUSCULAR | Status: DC | PRN

## 2023-12-16 MED ORDER — RALOXIFENE HCL 60 MG PO TABS
60.0000 mg | ORAL_TABLET | Freq: Every day | ORAL | Status: DC
  Administered 2023-12-16 – 2023-12-23 (×7): 60 mg via ORAL
  Filled 2023-12-16 (×10): qty 1

## 2023-12-16 MED ORDER — ASPIRIN 325 MG PO TABS
325.0000 mg | ORAL_TABLET | Freq: Once | ORAL | Status: AC
  Administered 2023-12-16: 325 mg via ORAL
  Filled 2023-12-16: qty 1

## 2023-12-16 MED ORDER — PANTOPRAZOLE SODIUM 40 MG IV SOLR
40.0000 mg | Freq: Two times a day (BID) | INTRAVENOUS | Status: DC
  Administered 2023-12-16 – 2023-12-19 (×6): 40 mg via INTRAVENOUS
  Filled 2023-12-16 (×6): qty 10

## 2023-12-16 NOTE — H&P (Addendum)
 History and Physical    Patient: [name] FMW:[id number] DOB: 1945-05-02 DOA: 12/16/2023 DOS: the patient was seen and examined on 12/16/2023 . PCP: Wendolyn Jenkins Jansky, MD  Patient coming from: Home Chief complaint: Chief Complaint  Patient presents with   Fall   Shortness of Breath   Aphasia   HPI:  [name] is a 78 y.o. 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  Patient at baseline uses walker and is ambulatory.  Has been having falls over the past few days which is new because she has not fallen in several years per husband.  ED Course:  Vital signs in the ED were notable for the following:  Vitals:   12/16/23 1900 12/16/23 1915 12/16/23 1930 12/16/23 1933  BP: 122/67 (!) 135/57 (!) 80/33   Pulse: 90 92 96   Temp:    98.7 F (37.1 C)  Resp:   (!) 28 20  Height:      Weight:      SpO2: 96% 95% 100%   TempSrc:    Oral  BMI (Calculated):       >>ED evaluation thus far shows: Initial CMP shows potassium of 3.4 bicarb of 15 anion gap of 24 glucose 133 AKI with a creatinine of 2.04.  Normal LFTs. Lactic acid of 2.0. CBC showing white count of 14.3 hemoglobin 12.4 RDW 16.1 platelets 505. Urinalysis is within normal limits. Head CT is negative for any acute intracranial abnormality CT is negative for acute abnormality patient does have chronic findings of degenerative disc disease moderate to space narrowing see complete report below. EKG today shows multiple artifact sinus tach 106 PR 166 QRS of 60 QTc 437, septal infarct read on EKG although I do not suspect so as there is much artifact.  Will repeat  EKG.  >>While in the ED patient received the following: Medications  sodium chloride  flush (NS) 0.9 % injection 3 mL (3 mLs Intravenous Given 12/16/23 1053)  lactated ringers  bolus 500 mL (0 mLs Intravenous Stopped 12/16/23 1442)   Review of Systems  Gastrointestinal:  Positive for diarrhea.  Musculoskeletal:  Positive for falls.  Neurological:  Positive for dizziness and weakness.   Past Medical History:  Diagnosis Date   Allergy 1980's   Sulfa drugs and penecillin   Anxiety    Cataract 2021   Corrected   Chronic kidney disease 2024   Stage 3   Depression    Headache    SINUS   Hyperlipidemia    Hypertension 2021   Osteopenia    Osteopenia    Primary localized osteoarthritis of right knee    Seizures (HCC) 10/2023   Past Surgical History:  Procedure Laterality Date   BREAST EXCISIONAL BIOPSY Right    benign cyst removal    CARPAL TUNNEL RELEASE Right 2015   Ortho in [place]   CHOLECYSTECTOMY N/A 02/08/2022   Procedure: LAPAROSCOPIC CHOLECYSTECTOMY WITH INTRAOPERATIVE CHOLANGIOGRAM;  Surgeon: Debby Hila, MD;  Location: [facility] OR[facility];  Service: General;  Laterality: N/A;   EYE SURGERY  12/2019   cataracts   FRACTURE SURGERY  2016   Rt.  leg   JOINT REPLACEMENT  09/2015   Right knee   LASIK  MENISCUS REPAIR Right 2011   Orthopedic in [facility]   TOTAL KNEE ARTHROPLASTY Right 09/20/2015   TOTAL KNEE ARTHROPLASTY Right 09/20/2015   Procedure: TOTAL KNEE ARTHROPLASTY;  Surgeon: Lamar Millman, MD;  Location: [facility] OR;  Service: Orthopedics;  Laterality: Right;   Trigger Thumb Right 2015   Done in [facility] at the same time as the carpal tunnel release   TUBAL LIGATION  1980    reports that she has never smoked. She has never used smokeless tobacco. She reports that she does not currently use alcohol. She reports that she does not use drugs. Allergies  Allergen Reactions   Penicillins Hives    Tolerates Cephalosporins    Prednisone  Other (See Comments)     Hyperactivity Sleep disturbance Confusion   Sulfa Antibiotics Hives        Family History  Problem Relation Age of Onset   Heart disease Mother    Arthritis Mother    Heart failure Mother    Heart attack Mother    Anxiety disorder Mother    Depression Mother    Hearing loss Mother    Varicose Veins Mother    Early death Father    Heart attack Father    Hearing loss Father    Heart disease Father    Hyperlipidemia Sister    Heart disease Sister    Hearing loss Sister    Arthritis Sister    Irregular heart beat Sister    Hypertension Sister    Hypertension Brother    Hyperlipidemia Brother    Heart attack Brother        Le[employer] Manager (Brother)   Asthma Brother    Alcohol abuse Brother    Heart disease Brother    Early death Brother    Heart attack Brother    Depression Son    Alcohol abuse Maternal Aunt    Alcohol abuse Maternal Aunt    Prior to Admission medications   Medication Sig Start Date End Date Taking? Authorizing Provider  amLODipine -benazepril  (LOTREL ) 5-20 MG capsule Take 1 capsule by mouth daily. 05/28/23   Wendolyn Jenkins Jansky, MD  atorvastatin  (LIPITOR) 20 MG tablet Take 1 tablet (20 mg total) by mouth daily. 05/28/23   Wendolyn Jenkins Jansky, MD  cyanocobalamin  1000 MCG tablet Take 0.5 tablets (500 mcg total) by mouth daily. 10/27/23   Briana Elgin LABOR, MD  cyclobenzaprine  (FLEXERIL ) 5 MG tablet Take 1 tablet (5 mg total) by mouth 3 (three) times daily as needed for muscle spasms. 12/09/23   Wendolyn Jenkins Jansky, MD  ibuprofen (ADVIL) 600 MG tablet Take 1 tablet (600 mg total) by mouth every 8 (eight) hours as needed. 11/27/23   Wendolyn Jenkins Jansky, MD  lacosamide  (VIMPAT ) 50 MG TABS tablet Take 1 tablet (50 mg total) by mouth 2 (two) times daily. 11/07/23 08/03/24  Camara, Amadou, MD  Multiple Vitamins-Minerals (MULTIVITAMIN WOMEN 50+) TABS Take 1 tablet by mouth daily.    [provider]  omeprazole  (PRILOSEC) 40 MG capsule Take 1 capsule (40 mg total) by mouth daily.  11/27/23   Wendolyn Jenkins Jansky, MD  OVER THE COUNTER MEDICATION Take 1 capsule by mouth every 12 (twelve) hours as needed (headache, cold/flu-like symptoms). OTC cold capsules    [provider]  raloxifene  (EVISTA ) 60 MG tablet Take 1 tablet (60 mg total) by mouth daily. 05/28/23   Wendolyn Jenkins Jansky, MD  triamterene -hydrochlorothiazide  (DYAZIDE ) 37.5-25 MG capsule Take 1 capsule by mouth daily. 05/28/23   Wendolyn Jenkins Jansky, MD  venlafaxine   XR (EFFEXOR -XR) 150 MG 24 hr capsule Take 1 capsule (150 mg total) by mouth daily with breakfast. 11/06/23 02/04/24  Bahraini, Sarah A  zolpidem  (AMBIEN ) 5 MG tablet Take 1-1.5 tablets (5-7.5 mg total) by mouth at bedtime as needed for sleep. 11/06/23   Mercy Lauraine LABOR                                                                                 Vitals:   12/16/23 1900 12/16/23 1915 12/16/23 1930 12/16/23 1933  BP: 122/67 (!) 135/57 (!) 80/33   Pulse: 90 92 96   Resp:   (!) 28 20  Temp:    98.7 F (37.1 C)  TempSrc:    Oral  SpO2: 96% 95% 100%   Weight:      Height:       Physical Exam Vitals reviewed.  Constitutional:      General: She is not in acute distress.    Appearance: She is obese. She is not ill-appearing.  HENT:     Head: Normocephalic.  Eyes:     Extraocular Movements: Extraocular movements intact.  Cardiovascular:     Rate and Rhythm: Normal rate and regular rhythm.     Pulses: Normal pulses.     Heart sounds: Normal heart sounds.  Pulmonary:     Effort: Pulmonary effort is normal.     Breath sounds: Normal breath sounds.  Abdominal:     General: There is no distension.     Palpations: Abdomen is soft.     Tenderness: There is no abdominal tenderness.  Musculoskeletal:     Right lower leg: No edema.     Left lower leg: No edema.  Skin:    Findings: Bruising present.     Comments: Lue bruising from her fall .   Neurological:     General: No focal deficit present.     Mental Status: She is alert and oriented to  person, place, and time.     Cranial Nerves: Cranial nerves 2-12 are intact. No cranial nerve deficit or facial asymmetry.     Motor: Weakness present. No atrophy or pronator drift.     Deep Tendon Reflexes:     Reflex Scores:      Bicep reflexes are 1+ on the right side and 1+ on the left side.      Patellar reflexes are 1+ on the right side and 1+ on the left side. Psychiatric:        Attention and Perception: Attention normal.        Mood and Affect: Mood normal.        Speech: Speech normal.        Behavior: Behavior normal. Behavior is cooperative.     Comments: Intermittent stuttering.      Labs on Admission: I have personally reviewed following labs and imaging studies CBC: Recent Labs  Lab 12/16/23 1043 12/16/23 1203  WBC 14.3*  --   NEUTROABS 12.6*  --   HGB 12.4 13.3  HCT 38.3 39.0  MCV 83.8  --   PLT 505*  --    Basic Metabolic Panel: Recent Labs  Lab 12/16/23 1043 12/16/23 1203 12/16/23  1658  NA 134* 133*  --   K 3.4* 3.2*  --   CL 95* 101  --   CO2 15*  --   --   GLUCOSE 133* 128*  --   BUN 22 23  --   CREATININE 2.04* 2.10*  --   CALCIUM  9.3  --   --   MG  --   --  2.1   GFR: Estimated Creatinine Clearance: 21.5 mL/min (A) (by C-G formula based on SCr of 2.1 mg/dL (H)). Liver Function Tests: Recent Labs  Lab 12/16/23 1043  AST 33  ALT 25  ALKPHOS 103  BILITOT 1.1  PROT 7.5  ALBUMIN 4.1   No results for input(s): LIPASE, AMYLASE in the last 168 hours. No results for input(s): AMMONIA in the last 168 hours. Recent Labs    05/28/23 1520 10/17/23 1801 10/17/23 2300 10/18/23 0439 10/22/23 2220 10/23/23 0743 10/24/23 0126 10/26/23 0535 11/27/23 1439 12/16/23 1043 12/16/23 1203  BUN 21 18  --  19 26* 23 22 30* 24* 22 23  CREATININE 1.31* 1.39* 1.01* 1.07* 1.51* 1.16* 1.07* 0.95 1.10 2.04* 2.10*    Cardiac Enzymes: Recent Labs  Lab 12/16/23 1658  CKTOTAL 230   BNP (last 3 results) No results for input(s): PROBNP in the  last 8760 hours. HbA1C: Recent Labs    12/16/23 1658  HGBA1C 5.7*   CBG: Recent Labs  Lab 12/16/23 1034  GLUCAP 141*   Lipid Profile: No results for input(s): CHOL, HDL, LDLCALC, TRIG, CHOLHDL, LDLDIRECT in the last 72 hours. Thyroid  Function Tests: Recent Labs    12/16/23 1658  TSH 1.444  FREET4 0.98   Anemia Panel: No results for input(s): VITAMINB12, FOLATE, FERRITIN, TIBC, IRON, RETICCTPCT in the last 72 hours. Urine analysis:    Component Value Date/Time   COLORURINE YELLOW 12/16/2023 1226   APPEARANCEUR HAZY (A) 12/16/2023 1226   LABSPEC 1.016 12/16/2023 1226   PHURINE 5.0 12/16/2023 1226   GLUCOSEU NEGATIVE 12/16/2023 1226   HGBUR NEGATIVE 12/16/2023 1226   BILIRUBINUR NEGATIVE 12/16/2023 1226   KETONESUR 5 (A) 12/16/2023 1226   PROTEINUR 30 (A) 12/16/2023 1226   NITRITE NEGATIVE 12/16/2023 1226   LEUKOCYTESUR NEGATIVE 12/16/2023 1226   Radiological Exams on Admission: MR BRAIN WO CONTRAST Result Date: 12/16/2023 EXAM: MRI BRAIN WITHOUT CONTRAST 12/16/2023 05:50:56 PM TECHNIQUE: Multiplanar multisequence MRI of the head/brain was performed without the administration of intravenous contrast. COMPARISON: Compared to CT from earlier the same day. CLINICAL HISTORY: FINDINGS: BRAIN AND VENTRICLES: Punctate 3 mm focus of restricted diffusion seen involving the mesial right temporal lobe/hippocampal formation (series 5, 67). No associated hemorrhage. No mass. No midline shift. No hydrocephalus. The sella is unremarkable. Normal flow voids. ORBITS: Prior bilateral ocular lens replacement. SINUSES AND MASTOIDS: No acute abnormality. BONES AND SOFT TISSUES: Normal marrow signal. No acute soft tissue abnormality. IMPRESSION: 1. Punctate 3 mm focus of restricted diffusion involving the mesial right temporal lobe/hippocampal formation. No associated hemorrhage. Primary differential considerations include a punctate acute ischemic nonhemorrhagic infarct  versus changes of transient global amnesia. 2. Otherwise essentially normal brain MRI for age. Electronically signed by: Morene Hoard MD 12/16/2023 06:35 PM EST RP Workstation: [id number]   DG Hip Unilat W or Wo Pelvis 2-3 Views Right Result Date: 12/16/2023 EXAM: 2 or 3 VIEW(S) XRAY OF THE RIGHT HIP 12/16/2023 01:31:00 PM COMPARISON: CT abdomen and pelvis 02/05/2022. CLINICAL HISTORY: pain bruising after fall FINDINGS: BONES AND JOINTS: No acute fracture or focal osseous lesion is identified in  the right hip. The right hip joint is maintained. No significant degenerative changes are seen in the right hip. There is a small sclerotic lesion in the left femoral head, most likely a bone island. SOFT TISSUES: The soft tissues are unremarkable. IMPRESSION: 1. No acute fracture or dislocation. Electronically signed by: Greig Pique MD 12/16/2023 01:45 PM EST RP Workstation: [id number]   DG Knee Complete 4 Views Right Result Date: 12/16/2023 EXAM: 4 VIEW(S) XRAY OF THE RIGHT KNEE 12/16/2023 01:31:00 PM COMPARISON: None available. CLINICAL HISTORY: pain bruising after fall FINDINGS: BONES AND JOINTS: Right knee total arthroplasty is in anatomic alignment. No evidence for hardware loosening. No acute fracture. No focal osseous lesion. No joint dislocation. No significant joint effusion. SOFT TISSUES: The soft tissues are unremarkable. IMPRESSION: 1. No acute findings. 2. Right knee total arthroplasty in anatomic alignment without evidence of hardware loosening. Electronically signed by: Greig Pique MD 12/16/2023 01:44 PM EST RP Workstation: [id number]   DG Ribs Unilateral Left Result Date: 12/16/2023 EXAM: 2 VIEW(S) XRAY OF THE LEFT RIBS 12/16/2023 01:31:00 PM COMPARISON: None available. CLINICAL HISTORY: [id number] Pain 144615; 190176 Fall 190176 Pain 144615; 190176 Fall 190176 FINDINGS: BONES: No acute displaced rib fracture. LUNGS AND PLEURA: Visualized lungs demonstrate no acute abnormality. IMPRESSION: 1.  No acute displaced rib fracture. Electronically signed by: Greig Pique MD 12/16/2023 01:42 PM EST RP Workstation: [id number]   DG Shoulder Left Result Date: 12/16/2023 EXAM: 1 VIEW XRAY OF THE LEFT SHOULDER 12/16/2023 01:31:00 PM COMPARISON: None available. CLINICAL HISTORY: Pain bruising after fall. FINDINGS: BONES AND JOINTS: Glenohumeral joint is normally aligned. No acute fracture or dislocation. The [facility] joint is unremarkable in appearance. SOFT TISSUES: No abnormal calcifications. Visualized lung is unremarkable. IMPRESSION: 1. No significant abnormality. Electronically signed by: Greig Pique MD 12/16/2023 01:41 PM EST RP Workstation: [id number]   DG Chest 2 View Result Date: 12/16/2023 EXAM: 2 VIEW(S) XRAY OF THE CHEST 12/16/2023 01:31:00 PM COMPARISON: Chest X-ray 10/17/2023. CLINICAL HISTORY: SOB SOB FINDINGS: LUNGS AND PLEURA: No focal pulmonary opacity. No pulmonary edema. No pleural effusion. No pneumothorax. HEART AND MEDIASTINUM: No acute abnormality of the cardiac and mediastinal silhouettes. BONES AND SOFT TISSUES: No acute osseous abnormality. IMPRESSION: 1. No acute process. Electronically signed by: Greig Pique MD 12/16/2023 01:40 PM EST RP Workstation: [id number]   CT Cervical Spine Wo Contrast Result Date: 12/16/2023 EXAM: CT CERVICAL SPINE WITHOUT CONTRAST 12/16/2023 11:39:12 AM TECHNIQUE: CT of the cervical spine was performed without the administration of intravenous contrast. Multiplanar reformatted images are provided for review. Automated exposure control, iterative reconstruction, and/or weight based adjustment of the mA/kV was utilized to reduce the radiation dose to as low as reasonably achievable. COMPARISON: None available. CLINICAL HISTORY: Neck trauma (Age >= 65y) FINDINGS: CERVICAL SPINE: BONES AND ALIGNMENT: There is no evidence of fracture or acute traumatic injury. DEGENERATIVE CHANGES: There is slight degenerative anterolisthesis at C3-C4. There is moderate disc space  narrowing and mild endplate ridging at C4-C5, C5-C6, and C6-C7. SOFT TISSUES: No prevertebral soft tissue swelling. The paraspinous soft tissues are unremarkable. IMPRESSION: 1. No acute abnormality of the cervical spine. 2. Slight degenerative anterolisthesis at C3-4. 3. Moderate disc space narrowing and mild endplate ridging at C4-5, C5-6, and C6-7. Electronically signed by: Evalene Coho MD 12/16/2023 11:45 AM EST RP Workstation: [id number]   CT HEAD WO CONTRAST Result Date: 12/16/2023 EXAM: CT HEAD WITHOUT CONTRAST 12/16/2023 11:39:12 AM TECHNIQUE: CT of the head was performed without the administration of intravenous contrast. Automated exposure control, iterative reconstruction, and/or weight  based adjustment of the mA/kV was utilized to reduce the radiation dose to as low as reasonably achievable. COMPARISON: 10/17/2023 CLINICAL HISTORY: Neuro deficit, acute, stroke suspected. FINDINGS: BRAIN AND VENTRICLES: No acute hemorrhage. No evidence of acute infarct. No hydrocephalus. No extra-axial collection. No mass effect or midline shift. VASCULATURE: Atherosclerosis of skullbase vasculature without hyperdense vessel or abnormal calcification. ORBITS: Bilateral cataract resection. SINUSES: No acute abnormality. SOFT TISSUES AND SKULL: No acute soft tissue abnormality. No skull fracture. Frontal hyperostosis. IMPRESSION: 1. No acute intracranial abnormality related to the suspected stroke. Electronically signed by: Evalene Coho MD 12/16/2023 11:43 AM EST RP Workstation: [id number]   Data Reviewed: Relevant notes from primary care and specialist visits, past discharge summaries as available in EHR, including Care Everywhere . Prior diagnostic testing as pertinent to current admission diagnoses, Updated medications and problem lists for reconciliation .ED course, including vitals, labs, imaging, treatment and response to treatment,Triage notes, nursing and pharmacy notes and ED provider's notes.Notable  results as noted in HPI.Discussed case with EDMD/ ED APP/ or Specialty MD on call and as needed.  Assessment & Plan   >>Fall / dizziness: Attributed to dehydration and hypovolemia and suspect hypotension causing lactic acidosis.Fall precaution.    >>Stuttering/ dysarthria: Initial pt was stuttering and MRI concerning for stroke, we will cont with echo and VTE eval and have requested Neuro consult. PT/OT consult.    >> Anion gap metabolic acidosis: 2/2 to lactic acidosis, continue with cautious hydration. Suspect pt may be over diuresing herself form meds or losses. We will get troponin / dimer echo.Low threshold for CTA chest pe eval if dimer is not negative.  BHB for DKA less likely.  Addendum: Elevated BHB mildly. A1c is 5.7 and this could be well controlled diabetic. Outpatient GTT.     >> Chronic diarrhea: Patient has a GI appointment that is upcoming. ? Infectious  or GIB .  Stool studies.Occult. Less likely inflammatory or microscopic colitis or malignancy but pt has GI appt.   >> Hypokalemia: Replaced in the ED. 2/2 to hydrochlorothiazide  GI losses.    >> Acute kidney injury: Lab Results  Component Value Date   CREATININE 2.10 (H) 12/16/2023   CREATININE 2.04 (H) 12/16/2023   CREATININE 1.10 11/27/2023  Avoid contrast studies and renally dose needed medications, will hold patient's HCTZ, and benazepril  avoid ibuprofen.   >> Generalized anxiety disorder/depression: Continue venlafaxine .    >> Seizures from ?CBD: Continue patient on Vimpat  continue seizure precautions aspiration and fall precautions. Suspect pt had neuro event from hypoxia in her September admit less likely seizure, We will proceed with MRI and dimer.   DVT prophylaxis:  Heparin.  Consults:  None.  Advance Care Planning:    Code Status: Full Code   Family Communication:  Ubal[place] spouse Disposition Plan:  Home Severity of Illness: The appropriate patient status for this patient is INPATIENT.  Inpatient status is judged to be reasonable and necessary in order to provide the required intensity of service to ensure the patient's safety. The patient's presenting symptoms, physical exam findings, and initial radiographic and laboratory data in the context of their chronic comorbidities is felt to place them at high risk for further clinical deterioration. Furthermore, it is not anticipated that the patient will be medically stable for discharge from the hospital within 2 midnights of admission.   * I certify that at the point of admission it is my clinical judgment that the patient will require inpatient hospital care spanning beyond 2 midnights from the point  of admission due to high intensity of service, high risk for further deterioration and high frequency of surveillance required.*  Unresulted Labs (From admission, onward)     Start     Ordered   12/17/23 0500  Occult blood card to lab, stool  Daily,   R      12/16/23 1401   12/16/23 1917  Beta-hydroxybutyric acid  Add-on,   AD        12/16/23 1916   12/16/23 1911  Basic metabolic panel  Once,   R        12/16/23 1910   12/16/23 1911  Magnesium   ONCE - STAT,   STAT        12/16/23 1910   12/16/23 1911  Hemoglobin and hematocrit, blood  ONCE - STAT,   STAT        12/16/23 1910   12/16/23 1401  Gastrointestinal Panel by PCR , Stool  (Gastrointestinal Panel by PCR, Stool                                                                                                                                                     **Does Not include CLOSTRIDIUM DIFFICILE testing. **If CDIFF testing is needed, place order from the C Difficile Testing order set.**)  Once,   URGENT        12/16/23 1401           Meds ordered this encounter  Medications   sodium chloride  flush (NS) 0.9 % injection 3 mL   lactated ringers  bolus 500 mL   DISCONTD: sodium chloride  0.9 % bolus 500 mL   atorvastatin  (LIPITOR) tablet 20 mg   lacosamide  (VIMPAT )  tablet 50 mg   raloxifene  (EVISTA ) tablet 60 mg   venlafaxine  XR (EFFEXOR -XR) 24 hr capsule 150 mg   sodium chloride  flush (NS) 0.9 % injection 3 mL   heparin injection 5,000 Units   0.9 %  sodium chloride  infusion   pantoprazole  (PROTONIX ) injection 40 mg   sodium chloride  0.9 % bolus 1,000 mL   Orders Placed This Encounter  Procedures   Gastrointestinal Panel by PCR , Stool   CT HEAD WO CONTRAST   DG Chest 2 View   CT Cervical Spine Wo Contrast   DG Shoulder Left   DG Hip Unilat W or Wo Pelvis 2-3 Views Right   DG Knee Complete 4 Views Right   DG Ribs Unilateral Left   MR BRAIN WO CONTRAST   NM Pulmonary Perfusion   Protime-INR   APTT   CBC   Differential   Comprehensive metabolic panel   Ethanol   Urinalysis, Routine w reflex microscopic -Urine, Catheterized   Occult blood card to lab, stool   Magnesium    Beta-hydroxybutyric acid   CK   Hemoglobin A1c   T4, free   TSH  D-dimer, quantitative   Basic metabolic panel   Magnesium    Hemoglobin and hematocrit, blood   Beta-hydroxybutyric acid   Diet Heart Room service appropriate? Yes; Fluid consistency: Thin   ED Cardiac monitoring   NIH Stroke Scale   Saline Lock IV, Maintain IV access   If O2 sat <94% Administer O2 @ 2 Liters/Minute   In and Out Cath   Cardiac monitoring   Maintain IV access   Vital signs   Orthostatic vital signs on admission   Notify physician (specify)   Refer to Sidebar Report Mobility Protocol for Adult Inpatient   Daily weights   Intake and output   Apply Syncope Care Plan   Initiate Oral Care Protocol   Initiate Carrier Fluid Protocol   SCDs   Ambulate with assistance   Swallow screen   Full code   Consult to hospitalist   Enteric precautions (UV disinfection) C difficile, Norovirus   PT eval and treat   ED Pulse oximetry, continuous   CBG monitoring, ED   I-stat chem 8, ED   CBG monitoring, ED   I-Stat CG4 Lactic Acid   ED EKG   ECHOCARDIOGRAM COMPLETE   Place in  observation (patient's expected length of stay will be less than 2 midnights)   Fall precautions   Seizure precautions   VAS US  LOWER EXTREMITY VENOUS (DVT)    Author: Mario LULLA Blanch, MD 12 pm- 8 pm. [facility]. 12/16/2023 7:50 PM Please note for any communication after hours contact [facility] Assigned provider on call on Amion.   ADDENDUM: Received RN message at 7 PM for pt being abnormal and noted pt being more dysarthric and speech being insensible.No neuro deficit otherwise. At bedside Pt is stuttering and intermittently exam same as before.

## 2023-12-16 NOTE — ED Notes (Signed)
 CCMD called.

## 2023-12-16 NOTE — Progress Notes (Signed)

## 2023-12-16 NOTE — ED Notes (Signed)
 CCMD called and pt placed on monitor

## 2023-12-16 NOTE — ED Triage Notes (Signed)
 Pt has multiple complaints: States since Friday she has had multiple falls from feeling dizzy. Pt also c.o slurred speech since Friday and feeling sob. Pt c.o left shoulder pain and right knee pain from one of the falls, family at bedside states she hit her head during a fall yesterday but no obvious trauma noted. Pt is not on any blood thinners

## 2023-12-16 NOTE — ED Notes (Signed)
 Patient transported to MRI

## 2023-12-16 NOTE — H&P (Incomplete)
 Neurology Consultation Reason for Consult: Altered mental status Referring Physician: Tobie, ED  CC: Altered mental status  History is obtained from: Chart review  HPI: [name] is a 78 y.o. female with a history of anxiety, hypertension, hyperlipidemia, previous history of seizures who presents with altered mental status.  She reports that she has been having difficulty with speech over the past couple of days, as well as difficulty with her balance.  I tried calling her husband, but unfortunately was not able to get a hold of him.  History from her is limited due to significant aphasia.  She was initially admitted in early September with altered mental status.  At that time, it was noted that she had taken a THC gummy to help with insomnia.  While in the ER, however, she had witnessed activity concerning for seizure, specifically episodes of staring.  She was monitored on EEG which did demonstrate some rhythmic delta activity, but no definite seizures.  She was started on Vimpat  50 mg twice daily, though I am uncertain if she was taking this.  LP was attempted, but was unsuccessful, even with fluoroscopy guidance.  Her encephalopathy improved, and she went home.  It is unclear to me if she return to baseline.     LKW: At least several days ago tnk given?: no, outside window   Past Medical History:  Diagnosis Date   Allergy 1980's   Sulfa drugs and penecillin   Anxiety    Cataract 2021   Corrected   Chronic kidney disease 2024   Stage 3   Depression    Headache    SINUS   Hyperlipidemia    Hypertension 2021   Osteopenia    Osteopenia    Primary localized osteoarthritis of right knee    Seizures (HCC) 10/2023     Family History  Problem Relation Age of Onset   Heart disease Mother    Arthritis Mother    Heart failure Mother    Heart attack Mother    Anxiety disorder Mother    Depression Mother    Hearing loss Mother    Varicose Veins Mother    Early  death Father    Heart attack Father    Hearing loss Father    Heart disease Father    Hyperlipidemia Sister    Heart disease Sister    Hearing loss Sister    Arthritis Sister    Irregular heart beat Sister    Hypertension Sister    Hypertension Brother    Hyperlipidemia Brother    Heart attack Brother        [employer] Manager (Brother)   Asthma Brother    Alcohol abuse Brother    Heart disease Brother    Early death Brother    Heart attack Brother    Depression Son    Alcohol abuse Maternal Aunt    Alcohol abuse Maternal Aunt      Social History:  reports that she has never smoked. She has never used smokeless tobacco. She reports that she does not currently use alcohol. She reports that she does not use drugs.   Exam: Current vital signs: BP (!) 140/82   Pulse 98   Temp 98.7 F (37.1 C) (Oral)   Resp 17   Ht 5' 2 (1.575 m)   Wt 81.6 kg   SpO2 97%   BMI 32.92 kg/m  Vital signs in last 24 hours: Temp:  [98.2 F (36.8 C)-98.7 F (37.1 C)] 98.7 F (37.1  C) (11/02 1933) Pulse Rate:  [85-107] 98 (11/02 2330) Resp:  [17-33] 17 (11/02 2200) BP: (80-151)/(33-91) 140/82 (11/02 2330) SpO2:  [95 %-100 %] 97 % (11/02 2330) Weight:  [81.6 kg] 81.6 kg (11/02 1038)   Physical Exam  Appears well-developed and well-nourished.   Neuro: Mental Status: Patient is awake, alert, she is confused, but also appears slightly aphasic, having some difficulty with word finding.  She is oriented to the fact that she is in the hospital, but unable to give me the month or the year Cranial Nerves: II: Visual Fields are full. Pupils are equal, round, and reactive to light.   III,IV, VI: EOMI without ptosis or diploplia.  V: Facial sensation is symmetric to temperature VII: Facial movement is symmetric(if first I thought there may be mild left nasolabial fold flattening, but when she smiles it is equal) VIII: hearing is intact to voice X: Uvula elevates symmetrically XI: Shoulder shrug is  symmetric. XII: tongue is midline without atrophy or fasciculations.  Motor: She is able to hold all extremities aloft without drift Sensory: S she reports symmetric sensation, does not extinguish to DSS Cerebellar: FNF intact bilaterally   I have reviewed labs in epic and the results pertinent to this consultation are: Results for orders placed or performed during the hospital encounter of 12/16/23 (from the past 24 hours)  CBG monitoring, ED     Status: Abnormal   Collection Time: 12/16/23 10:34 AM  Result Value Ref Range   Glucose-Capillary 141 (H) 70 - 99 mg/dL  Ethanol     Status: None   Collection Time: 12/16/23 10:40 AM  Result Value Ref Range   Alcohol, Ethyl (B) <15 <15 mg/dL  Protime-INR     Status: None   Collection Time: 12/16/23 10:43 AM  Result Value Ref Range   Prothrombin Time 14.4 11.4 - 15.2 seconds   INR 1.1 0.8 - 1.2  APTT     Status: Abnormal   Collection Time: 12/16/23 10:43 AM  Result Value Ref Range   aPTT <22 (L) 24 - 36 seconds  CBC     Status: Abnormal   Collection Time: 12/16/23 10:43 AM  Result Value Ref Range   WBC 14.3 (H) 4.0 - 10.5 K/uL   RBC 4.57 3.87 - 5.11 MIL/uL   Hemoglobin 12.4 12.0 - 15.0 g/dL   HCT 61.6 63.9 - 53.9 %   MCV 83.8 80.0 - 100.0 fL   MCH 27.1 26.0 - 34.0 pg   MCHC 32.4 30.0 - 36.0 g/dL   RDW 83.8 (H) 88.4 - 84.4 %   Platelets 505 (H) 150 - 400 K/uL   nRBC 0.0 0.0 - 0.2 %  Differential     Status: Abnormal   Collection Time: 12/16/23 10:43 AM  Result Value Ref Range   Neutrophils Relative % 87 %   Neutro Abs 12.6 (H) 1.7 - 7.7 K/uL   Lymphocytes Relative 7 %   Lymphs Abs 1.0 0.7 - 4.0 K/uL   Monocytes Relative 5 %   Monocytes Absolute 0.7 0.1 - 1.0 K/uL   Eosinophils Relative 0 %   Eosinophils Absolute 0.0 0.0 - 0.5 K/uL   Basophils Relative 0 %   Basophils Absolute 0.0 0.0 - 0.1 K/uL   Immature Granulocytes 1 %   Abs Immature Granulocytes 0.09 (H) 0.00 - 0.07 K/uL  Comprehensive metabolic panel     Status:  Abnormal   Collection Time: 12/16/23 10:43 AM  Result Value Ref Range   Sodium 134 (  L) 135 - 145 mmol/L   Potassium 3.4 (L) 3.5 - 5.1 mmol/L   Chloride 95 (L) 98 - 111 mmol/L   CO2 15 (L) 22 - 32 mmol/L   Glucose, Bld 133 (H) 70 - 99 mg/dL   BUN 22 8 - 23 mg/dL   Creatinine, Ser 7.95 (H) 0.44 - 1.00 mg/dL   Calcium  9.3 8.9 - 10.3 mg/dL   Total Protein 7.5 6.5 - 8.1 g/dL   Albumin 4.1 3.5 - 5.0 g/dL   AST 33 15 - 41 U/L   ALT 25 0 - 44 U/L   Alkaline Phosphatase 103 38 - 126 U/L   Total Bilirubin 1.1 0.0 - 1.2 mg/dL   GFR, Estimated 24 (L) >60 mL/min   Anion gap 24 (H) 5 - 15  I-stat chem 8, ED     Status: Abnormal   Collection Time: 12/16/23 12:03 PM  Result Value Ref Range   Sodium 133 (L) 135 - 145 mmol/L   Potassium 3.2 (L) 3.5 - 5.1 mmol/L   Chloride 101 98 - 111 mmol/L   BUN 23 8 - 23 mg/dL   Creatinine, Ser 7.89 (H) 0.44 - 1.00 mg/dL   Glucose, Bld 871 (H) 70 - 99 mg/dL   Calcium , Ion 1.00 (L) 1.15 - 1.40 mmol/L   TCO2 18 (L) 22 - 32 mmol/L   Hemoglobin 13.3 12.0 - 15.0 g/dL   HCT 60.9 63.9 - 53.9 %  Urinalysis, Routine w reflex microscopic -Urine, Catheterized     Status: Abnormal   Collection Time: 12/16/23 12:26 PM  Result Value Ref Range   Color, Urine YELLOW YELLOW   APPearance HAZY (A) CLEAR   Specific Gravity, Urine 1.016 1.005 - 1.030   pH 5.0 5.0 - 8.0   Glucose, UA NEGATIVE NEGATIVE mg/dL   Hgb urine dipstick NEGATIVE NEGATIVE   Bilirubin Urine NEGATIVE NEGATIVE   Ketones, ur 5 (A) NEGATIVE mg/dL   Protein, ur 30 (A) NEGATIVE mg/dL   Nitrite NEGATIVE NEGATIVE   Leukocytes,Ua NEGATIVE NEGATIVE   RBC / HPF 0-5 0 - 5 RBC/hpf   WBC, UA 0-5 0 - 5 WBC/hpf   Bacteria, UA NONE SEEN NONE SEEN   Squamous Epithelial / HPF 0-5 0 - 5 /HPF   Mucus PRESENT    Hyaline Casts, UA PRESENT   I-Stat CG4 Lactic Acid     Status: Abnormal   Collection Time: 12/16/23  2:34 PM  Result Value Ref Range   Lactic Acid, Venous 2.0 (HH) 0.5 - 1.9 mmol/L   Comment NOTIFIED  PHYSICIAN   Magnesium      Status: None   Collection Time: 12/16/23  4:58 PM  Result Value Ref Range   Magnesium  2.1 1.7 - 2.4 mg/dL  Beta-hydroxybutyric acid     Status: Abnormal   Collection Time: 12/16/23  4:58 PM  Result Value Ref Range   Beta-Hydroxybutyric Acid 0.84 (H) 0.05 - 0.27 mmol/L  CK     Status: None   Collection Time: 12/16/23  4:58 PM  Result Value Ref Range   Total CK 230 38 - 234 U/L  Hemoglobin A1c     Status: Abnormal   Collection Time: 12/16/23  4:58 PM  Result Value Ref Range   Hgb A1c MFr Bld 5.7 (H) 4.8 - 5.6 %   Mean Plasma Glucose 116.89 mg/dL  T4, free     Status: None   Collection Time: 12/16/23  4:58 PM  Result Value Ref Range   Free T4 0.98 0.61 -  1.12 ng/dL  TSH     Status: None   Collection Time: 12/16/23  4:58 PM  Result Value Ref Range   TSH 1.444 0.350 - 4.500 uIU/mL  D-dimer, quantitative     Status: Abnormal   Collection Time: 12/16/23  4:58 PM  Result Value Ref Range   D-Dimer, Quant 1.83 (H) 0.00 - 0.50 ug/mL-FEU  I-Stat CG4 Lactic Acid     Status: Abnormal   Collection Time: 12/16/23  5:00 PM  Result Value Ref Range   Lactic Acid, Venous 3.1 (HH) 0.5 - 1.9 mmol/L   Comment NOTIFIED PHYSICIAN   Basic metabolic panel     Status: Abnormal   Collection Time: 12/16/23  8:27 PM  Result Value Ref Range   Sodium 135 135 - 145 mmol/L   Potassium 3.0 (L) 3.5 - 5.1 mmol/L   Chloride 100 98 - 111 mmol/L   CO2 19 (L) 22 - 32 mmol/L   Glucose, Bld 105 (H) 70 - 99 mg/dL   BUN 24 (H) 8 - 23 mg/dL   Creatinine, Ser 8.24 (H) 0.44 - 1.00 mg/dL   Calcium  8.4 (L) 8.9 - 10.3 mg/dL   GFR, Estimated 29 (L) >60 mL/min   Anion gap 16 (H) 5 - 15  Magnesium      Status: None   Collection Time: 12/16/23  8:27 PM  Result Value Ref Range   Magnesium  1.9 1.7 - 2.4 mg/dL  Beta-hydroxybutyric acid     Status: Abnormal   Collection Time: 12/16/23  8:27 PM  Result Value Ref Range   Beta-Hydroxybutyric Acid 1.41 (H) 0.05 - 0.27 mmol/L  Troponin I (High  Sensitivity)     Status: Abnormal   Collection Time: 12/16/23  8:27 PM  Result Value Ref Range   Troponin I (High Sensitivity) 18 (H) <18 ng/L  I-Stat CG4 Lactic Acid     Status: None   Collection Time: 12/16/23  8:32 PM  Result Value Ref Range   Lactic Acid, Venous 1.2 0.5 - 1.9 mmol/L  Hemoglobin and hematocrit, blood     Status: Abnormal   Collection Time: 12/16/23  9:12 PM  Result Value Ref Range   Hemoglobin 10.2 (L) 12.0 - 15.0 g/dL   HCT 68.4 (L) 63.9 - 53.9 %  Troponin I (High Sensitivity)     Status: Abnormal   Collection Time: 12/16/23 10:23 PM  Result Value Ref Range   Troponin I (High Sensitivity) 19 (H) <18 ng/L  I-Stat CG4 Lactic Acid     Status: None   Collection Time: 12/16/23 10:25 PM  Result Value Ref Range   Lactic Acid, Venous 1.0 0.5 - 1.9 mmol/L     I have reviewed the images obtained:MRI brain -punctate area of restricted diffusion in the right hippocampus, of unclear significance, possibly punctate stroke  Of note B12 was 192 when previously checked in early September and she was started on enteral B12 supplementation  Impression: 78 yo F with altered mental status of unclear etiology.  Given the concern for seizures previously, and the fact that she is still on a subtherapeutic dose of lacosamide  I will increase this.  She has been confused for multiple days already, and so I do not think it is emergent call and an EEG tech, but I do think repeating continuous EEG could possibly be helpful.  She is aphasic, but no evidence of left-sided injury on MRI, I will get a MRA of the head to make sure she does not have a LVO with  no infarct as of yet.  She did have a low B12 previously and is being repleted orally, if she is not absorbing this orally, then this could be the etiology.  I would recheck this.  TSH has been normal.   Autoimmune etiologies could be a consideration as could recurrent seizures.  Recommendations: 1) overnight EEG beginning in the morning 2)  MRA head 3) recheck B12, mma 4) ammonia 5) May need to consider repeating LP 6) increase lacosamide  to 100 mg twice daily 7) neurology will follow    [name], MD [facility]   If 7pm- 7am, please page neurology on call as listed in [facility].

## 2023-12-16 NOTE — ED Notes (Signed)
 Patient transported to X-ray

## 2023-12-16 NOTE — ED Notes (Signed)
 Patient transported to CT

## 2023-12-16 NOTE — ED Provider Notes (Signed)
 [place] [facility] AT [facility] Provider Note   CSN: [id number] Arrival date & time: 12/16/23  1026     Patient presents with: Fall, Shortness of Breath, and Aphasia   [name] is a 78 y.o. female.   HPI 78 year old female history of hypertension, hyperlipidemia, osteopenia, seizures, CKD, presents today with reports that she has fallen several times over the past few days.  She states she is having difficulty with her balance.  She reports that she feels dizzy and has had some slurred speech.  She has pain in her left shoulder and her right knee from one of the falls.  Husband reports that she struck her head.  No obvious trauma noted.  Patient is not on blood thinners. Patient endorses nausea, no vomiting, endorses 2 loose bowel movements.  She reports being sent to gi due to diarrhea but does not have appointment until January. Some chills    Prior to Admission medications   Medication Sig Start Date End Date Taking? Authorizing Provider  amLODipine -benazepril  (LOTREL ) 5-20 MG capsule Take 1 capsule by mouth daily. 05/28/23   Wendolyn Jenkins Jansky, MD  atorvastatin  (LIPITOR) 20 MG tablet Take 1 tablet (20 mg total) by mouth daily. 05/28/23   Wendolyn Jenkins Jansky, MD  cyanocobalamin  1000 MCG tablet Take 0.5 tablets (500 mcg total) by mouth daily. 10/27/23   Briana Elgin LABOR, MD  cyclobenzaprine  (FLEXERIL ) 5 MG tablet Take 1 tablet (5 mg total) by mouth 3 (three) times daily as needed for muscle spasms. 12/09/23   Wendolyn Jenkins Jansky, MD  ibuprofen (ADVIL) 600 MG tablet Take 1 tablet (600 mg total) by mouth every 8 (eight) hours as needed. 11/27/23   Wendolyn Jenkins Jansky, MD  lacosamide  (VIMPAT ) 50 MG TABS tablet Take 1 tablet (50 mg total) by mouth 2 (two) times daily. 11/07/23 08/03/24  Camara, Amadou, MD  Multiple Vitamins-Minerals (MULTIVITAMIN WOMEN 50+) TABS Take 1 tablet by mouth daily.    [provider]  omeprazole  (PRILOSEC) 40 MG capsule Take 1 capsule  (40 mg total) by mouth daily. 11/27/23   Wendolyn Jenkins Jansky, MD  OVER THE COUNTER MEDICATION Take 1 capsule by mouth every 12 (twelve) hours as needed (headache, cold/flu-like symptoms). OTC cold capsules    [provider]  raloxifene  (EVISTA ) 60 MG tablet Take 1 tablet (60 mg total) by mouth daily. 05/28/23   Wendolyn Jenkins Jansky, MD  triamterene -hydrochlorothiazide  (DYAZIDE ) 37.5-25 MG capsule Take 1 capsule by mouth daily. 05/28/23   Wendolyn Jenkins Jansky, MD  venlafaxine  XR (EFFEXOR -XR) 150 MG 24 hr capsule Take 1 capsule (150 mg total) by mouth daily with breakfast. 11/06/23 02/04/24  Bahraini, Sarah A  zolpidem  (AMBIEN ) 5 MG tablet Take 1-1.5 tablets (5-7.5 mg total) by mouth at bedtime as needed for sleep. 11/06/23   Bahraini, Sarah A    Allergies: Penicillins, Prednisone , and Sulfa antibiotics    Review of Systems  Updated Vital Signs BP 134/71   Pulse (!) 104   Temp 98.3 F (36.8 C) (Oral)   Resp (!) 27   Ht 1.575 m (5' 2)   Wt 81.6 kg   SpO2 98%   BMI 32.92 kg/m   Physical Exam Vitals reviewed.  HENT:     Head: Normocephalic and atraumatic.     Mouth/Throat:     Mouth: Mucous membranes are moist.     Pharynx: Oropharynx is clear.  Eyes:     Pupils: Pupils are equal, round, and reactive to light.  Cardiovascular:  Rate and Rhythm: Normal rate and regular rhythm.  Pulmonary:     Effort: Pulmonary effort is normal.     Breath sounds: Normal breath sounds.  Chest:     Chest wall: No mass.  Abdominal:     Palpations: Abdomen is soft.  Musculoskeletal:        General: Normal range of motion.     Cervical back: Normal range of motion and neck supple.     Comments: Contusion over left posterior shoulder onto chest wall with some tenderness No crepitus noted Some tenderness over cervical spine No tenderness over thoracic or lumbar spine Some tenderness of her right hip and right knee  Lymphadenopathy:     Cervical: No cervical adenopathy.  Neurological:      Mental Status: She is alert.     (all labs ordered are listed, but only abnormal results are displayed) Labs Reviewed  APTT - Abnormal; Notable for the following components:      Result Value   aPTT <22 (*)    All other components within normal limits  CBC - Abnormal; Notable for the following components:   WBC 14.3 (*)    RDW 16.1 (*)    Platelets 505 (*)    All other components within normal limits  DIFFERENTIAL - Abnormal; Notable for the following components:   Neutro Abs 12.6 (*)    Abs Immature Granulocytes 0.09 (*)    All other components within normal limits  COMPREHENSIVE METABOLIC PANEL WITH GFR - Abnormal; Notable for the following components:   Sodium 134 (*)    Potassium 3.4 (*)    Chloride 95 (*)    CO2 15 (*)    Glucose, Bld 133 (*)    Creatinine, Ser 2.04 (*)    GFR, Estimated 24 (*)    Anion gap 24 (*)    All other components within normal limits  CBG MONITORING, ED - Abnormal; Notable for the following components:   Glucose-Capillary 141 (*)    All other components within normal limits  I-STAT CHEM 8, ED - Abnormal; Notable for the following components:   Sodium 133 (*)    Potassium 3.2 (*)    Creatinine, Ser 2.10 (*)    Glucose, Bld 128 (*)    Calcium , Ion 1.00 (*)    TCO2 18 (*)    All other components within normal limits  GASTROINTESTINAL PANEL BY PCR, STOOL (REPLACES STOOL CULTURE)  PROTIME-INR  ETHANOL  URINALYSIS, ROUTINE W REFLEX MICROSCOPIC  MAGNESIUM   CBG MONITORING, ED  I-STAT CG4 LACTIC ACID, ED    EKG: EKG Interpretation Date/Time:  Sunday December 16 2023 10:58:50 EST Ventricular Rate:  107 PR Interval:  166 QRS Duration:  60 QT Interval:  328 QTC Calculation: 437 R Axis:   162  Text Interpretation: Sinus tachycardia Low voltage QRS Left posterior fascicular block Septal infarct , age undetermined Abnormal ECG When compared with ECG of 22-Oct-2023 23:01, PREVIOUS ECG IS PRESENT Confirmed by Levander Houston [phone]) on 12/16/2023  12:26:47 PM  Radiology: [facility] Hip Unilat W or Wo Pelvis 2-3 Views Right Result Date: 12/16/2023 EXAM: 2 or 3 VIEW(S) XRAY OF THE RIGHT HIP 12/16/2023 01:31:00 PM COMPARISON: CT abdomen and pelvis 02/05/2022. CLINICAL HISTORY: pain bruising after fall FINDINGS: BONES AND JOINTS: No acute fracture or focal osseous lesion is identified in the right hip. The right hip joint is maintained. No significant degenerative changes are seen in the right hip. There is a small sclerotic lesion in the left femoral  head, most likely a bone island. SOFT TISSUES: The soft tissues are unremarkable. IMPRESSION: 1. No acute fracture or dislocation. Electronically signed by: Greig Pique MD 12/16/2023 01:45 PM EST RP Workstation: [id number]   DG Knee Complete 4 Views Right Result Date: 12/16/2023 EXAM: 4 VIEW(S) XRAY OF THE RIGHT KNEE 12/16/2023 01:31:00 PM COMPARISON: None available. CLINICAL HISTORY: pain bruising after fall FINDINGS: BONES AND JOINTS: Right knee total arthroplasty is in anatomic alignment. No evidence for hardware loosening. No acute fracture. No focal osseous lesion. No joint dislocation. No significant joint effusion. SOFT TISSUES: The soft tissues are unremarkable. IMPRESSION: 1. No acute findings. 2. Right knee total arthroplasty in anatomic alignment without evidence of hardware loosening. Electronically signed by: Greig Pique MD 12/16/2023 01:44 PM EST RP Workstation: [id number]   DG Ribs Unilateral Left Result Date: 12/16/2023 EXAM: 2 VIEW(S) XRAY OF THE LEFT RIBS 12/16/2023 01:31:00 PM COMPARISON: None available. CLINICAL HISTORY: 855384 Pain 144615; 190176 Fall 190176 Pain 144615; 190176 Fall 190176 FINDINGS: BONES: No acute displaced rib fracture. LUNGS AND PLEURA: Visualized lungs demonstrate no acute abnormality. IMPRESSION: 1. No acute displaced rib fracture. Electronically signed by: Greig Pique MD 12/16/2023 01:42 PM EST RP Workstation: [id number]   DG Shoulder Left Result Date:  12/16/2023 EXAM: 1 VIEW XRAY OF THE LEFT SHOULDER 12/16/2023 01:31:00 PM COMPARISON: None available. CLINICAL HISTORY: Pain bruising after fall. FINDINGS: BONES AND JOINTS: Glenohumeral joint is normally aligned. No acute fracture or dislocation. The [facility] joint is unremarkable in appearance. SOFT TISSUES: No abnormal calcifications. Visualized lung is unremarkable. IMPRESSION: 1. No significant abnormality. Electronically signed by: Greig Pique MD 12/16/2023 01:41 PM EST RP Workstation: [id number]   DG Chest 2 View Result Date: 12/16/2023 EXAM: 2 VIEW(S) XRAY OF THE CHEST 12/16/2023 01:31:00 PM COMPARISON: Chest X-[facility] 10/17/2023. CLINICAL HISTORY: SOB SOB FINDINGS: LUNGS AND PLEURA: No focal pulmonary opacity. No pulmonary edema. No pleural effusion. No pneumothorax. HEART AND MEDIASTINUM: No acute abnormality of the cardiac and mediastinal silhouettes. BONES AND SOFT TISSUES: No acute osseous abnormality. IMPRESSION: 1. No acute process. Electronically signed by: Greig Pique MD 12/16/2023 01:40 PM EST RP Workstation: [id number]   CT Cervical Spine Wo Contrast Result Date: 12/16/2023 EXAM: CT CERVICAL SPINE WITHOUT CONTRAST 12/16/2023 11:39:12 AM TECHNIQUE: CT of the cervical spine was performed without the administration of intravenous contrast. Multiplanar reformatted images are provided for review. Automated exposure control, iterative reconstruction, and/or weight based adjustment of the mA/kV was utilized to reduce the radiation dose to as low as reasonably achievable. COMPARISON: None available. CLINICAL HISTORY: Neck trauma (Age >= 65y) FINDINGS: CERVICAL SPINE: BONES AND ALIGNMENT: There is no evidence of fracture or acute traumatic injury. DEGENERATIVE CHANGES: There is slight degenerative anterolisthesis at C3-C4. There is moderate disc space narrowing and mild endplate ridging at C4-C5, C5-C6, and C6-C7. SOFT TISSUES: No prevertebral soft tissue swelling. The paraspinous soft tissues are  unremarkable. IMPRESSION: 1. No acute abnormality of the cervical spine. 2. Slight degenerative anterolisthesis at C3-4. 3. Moderate disc space narrowing and mild endplate ridging at C4-5, C5-6, and C6-7. Electronically signed by: Evalene Coho MD 12/16/2023 11:45 AM EST RP Workstation: [id number]   CT HEAD WO CONTRAST Result Date: 12/16/2023 EXAM: CT HEAD WITHOUT CONTRAST 12/16/2023 11:39:12 AM TECHNIQUE: CT of the head was performed without the administration of intravenous contrast. Automated exposure control, iterative reconstruction, and/or weight based adjustment of the mA/kV was utilized to reduce the radiation dose to as low as reasonably achievable. COMPARISON: 10/17/2023 CLINICAL HISTORY: Neuro deficit, acute, stroke suspected. FINDINGS: BRAIN  AND VENTRICLES: No acute hemorrhage. No evidence of acute infarct. No hydrocephalus. No extra-axial collection. No mass effect or midline shift. VASCULATURE: Atherosclerosis of skullbase vasculature without hyperdense vessel or abnormal calcification. ORBITS: Bilateral cataract resection. SINUSES: No acute abnormality. SOFT TISSUES AND SKULL: No acute soft tissue abnormality. No skull fracture. Frontal hyperostosis. IMPRESSION: 1. No acute intracranial abnormality related to the suspected stroke. Electronically signed by: Evalene Coho MD 12/16/2023 11:43 AM EST RP Workstation: [id number]     Procedures   Medications Ordered in the ED  lactated ringers  bolus 500 mL (has no administration in time range)  sodium chloride  0.9 % bolus 500 mL (has no administration in time range)  sodium chloride  flush (NS) 0.9 % injection 3 mL (3 mLs Intravenous Given 12/16/23 1053)    Clinical Course as of 12/16/23 1401  Sun Dec 16, 2023  1327 Complete metabolic panel reviewed interpreted Sypher sodium 134, potassium 3.4, CO2 decreased to 15, glucose 133, creatinine increased to 2.04 Reviewed complete metabolic panel from 2 weeks ago which was normal [DR]  1327  CBC reviewed interpreted significant for white blood cell count 14,300 platelets 505,000 First prior 2 weeks ago with elevated platelets and mild anemia [DR]  1328 Alcohol less than 15 [DR]  1328 Cervical spine x-rays reviewed interpreted within normal limits [DR]  1328 CT head reviewed interpreted no evidence of acute normality radiologist interpretation concurs [DR]  1328 Chest x-Sukhman Martine reviewed interpreted significant for mild cardiomegaly with no acute infiltrates awaiting radiologist interpretation [DR]  1345 Right knee x-Lindalou Soltis reviewed and no evidence of acute fracture [DR]  1345 Right rib x-Sokha Craker reviewed interpreted no evidence of acute rib fracture noted [DR]  1346 Left shoulder x-Siobhan Zaro reviewed interpreted no evidence of acute abnormalities noted [DR]    Clinical Course User Index [DR] Levander Houston, MD                                 Medical Decision Making Amount and/or Complexity of Data Reviewed Labs: ordered. Radiology: ordered.   78 year old female presents today with weakness and lightheadedness that have worsened over the past 4 days.  She has had multiple falls in the past several days.  She is complaining of pain in her left shoulder from falls, right hip, she struck her head.  She is not currently on any blood thinners.  Patient was evaluated here for the symptoms. She was evaluated with imaging of her head and neck that showed no evidence of acute injury She was evaluated with x-rays that show no evidence of acute fracture Chest x-Deyona Soza showed no evidence of acute infiltrates. Patient was evaluated with labs are significant for increase of creatinine from baseline of 1-2, CO2 decreased to 15, sodium decreased at 134 potassium 3.4 chloride 95. Patient with leukocytosis of 14,300 Urinalysis is pending at this time. She does also endorse some diarrhea. Abdomen is soft and nontender Patient is mildly tachycardic heart rate of 104, normotensive, normal oxygen saturations On  reevaluation patient does endorse decreased p.o. intake with poor appetite. Suspect some volume depletion with increased creatinine. Additionally electrolyte abnormalities may be contributing to weakness Care discussed with Dr. Tobie who will see for admission     Final diagnoses:  AKI (acute kidney injury)  Weakness  Hypokalemia    ED Discharge Orders     None          Levander Houston, MD 12/16/23 1401

## 2023-12-16 NOTE — Progress Notes (Signed)
 D/W spouse mr larry about abnormal MRI compared to September and is pos for small acute cva. ASA 325x 1 / 81 daily. LKN is few days ago. Prior to that pt was ambulatory.

## 2023-12-16 NOTE — ED Notes (Signed)
 MD at bedside, pt NIH varying each time. Pt with difficulty in peripheral vision on R side upon assessment. No sensation differences, aox4 and follows commands but starts saying weird sentences that do not pertain to the conversation. Pt reports it is hard getting all my words out and I know I do not make sense. When asked a question pt will give an accurate answer and then continue on with inappriopriate conversation that does not make sense with the question. MD reports pt to be laying flat and NPO dt possible swallow changes, okay to give meds still if pt continues to pass swallow screening but NPO otherwise.

## 2023-12-17 ENCOUNTER — Observation Stay (HOSPITAL_BASED_OUTPATIENT_CLINIC_OR_DEPARTMENT_OTHER)

## 2023-12-17 ENCOUNTER — Observation Stay (HOSPITAL_COMMUNITY)

## 2023-12-17 DIAGNOSIS — N179 Acute kidney failure, unspecified: Secondary | ICD-10-CM | POA: Diagnosis present

## 2023-12-17 DIAGNOSIS — G4700 Insomnia, unspecified: Secondary | ICD-10-CM | POA: Diagnosis present

## 2023-12-17 DIAGNOSIS — G459 Transient cerebral ischemic attack, unspecified: Secondary | ICD-10-CM

## 2023-12-17 DIAGNOSIS — R0602 Shortness of breath: Secondary | ICD-10-CM | POA: Diagnosis not present

## 2023-12-17 DIAGNOSIS — Z79899 Other long term (current) drug therapy: Secondary | ICD-10-CM | POA: Diagnosis not present

## 2023-12-17 DIAGNOSIS — Z811 Family history of alcohol abuse and dependence: Secondary | ICD-10-CM | POA: Diagnosis not present

## 2023-12-17 DIAGNOSIS — E785 Hyperlipidemia, unspecified: Secondary | ICD-10-CM | POA: Diagnosis present

## 2023-12-17 DIAGNOSIS — I639 Cerebral infarction, unspecified: Secondary | ICD-10-CM

## 2023-12-17 DIAGNOSIS — E872 Acidosis, unspecified: Secondary | ICD-10-CM | POA: Diagnosis present

## 2023-12-17 DIAGNOSIS — R7989 Other specified abnormal findings of blood chemistry: Secondary | ICD-10-CM

## 2023-12-17 DIAGNOSIS — Z96651 Presence of right artificial knee joint: Secondary | ICD-10-CM | POA: Diagnosis present

## 2023-12-17 DIAGNOSIS — F3340 Major depressive disorder, recurrent, in remission, unspecified: Secondary | ICD-10-CM | POA: Diagnosis present

## 2023-12-17 DIAGNOSIS — S2242XA Multiple fractures of ribs, left side, initial encounter for closed fracture: Secondary | ICD-10-CM | POA: Diagnosis present

## 2023-12-17 DIAGNOSIS — T40715A Adverse effect of cannabis, initial encounter: Secondary | ICD-10-CM | POA: Diagnosis not present

## 2023-12-17 DIAGNOSIS — E860 Dehydration: Secondary | ICD-10-CM | POA: Diagnosis present

## 2023-12-17 DIAGNOSIS — E861 Hypovolemia: Secondary | ICD-10-CM | POA: Diagnosis present

## 2023-12-17 DIAGNOSIS — R471 Dysarthria and anarthria: Secondary | ICD-10-CM | POA: Diagnosis present

## 2023-12-17 DIAGNOSIS — I129 Hypertensive chronic kidney disease with stage 1 through stage 4 chronic kidney disease, or unspecified chronic kidney disease: Secondary | ICD-10-CM | POA: Diagnosis present

## 2023-12-17 DIAGNOSIS — K529 Noninfective gastroenteritis and colitis, unspecified: Secondary | ICD-10-CM | POA: Diagnosis present

## 2023-12-17 DIAGNOSIS — G40909 Epilepsy, unspecified, not intractable, without status epilepticus: Secondary | ICD-10-CM | POA: Diagnosis present

## 2023-12-17 DIAGNOSIS — R569 Unspecified convulsions: Secondary | ICD-10-CM | POA: Diagnosis not present

## 2023-12-17 DIAGNOSIS — F411 Generalized anxiety disorder: Secondary | ICD-10-CM | POA: Diagnosis present

## 2023-12-17 DIAGNOSIS — R296 Repeated falls: Secondary | ICD-10-CM

## 2023-12-17 DIAGNOSIS — R4701 Aphasia: Secondary | ICD-10-CM | POA: Diagnosis present

## 2023-12-17 DIAGNOSIS — R0989 Other specified symptoms and signs involving the circulatory and respiratory systems: Secondary | ICD-10-CM | POA: Diagnosis not present

## 2023-12-17 DIAGNOSIS — W19XXXA Unspecified fall, initial encounter: Secondary | ICD-10-CM | POA: Diagnosis present

## 2023-12-17 DIAGNOSIS — Z8249 Family history of ischemic heart disease and other diseases of the circulatory system: Secondary | ICD-10-CM | POA: Diagnosis not present

## 2023-12-17 DIAGNOSIS — E669 Obesity, unspecified: Secondary | ICD-10-CM | POA: Diagnosis present

## 2023-12-17 DIAGNOSIS — Z7982 Long term (current) use of aspirin: Secondary | ICD-10-CM | POA: Diagnosis not present

## 2023-12-17 DIAGNOSIS — F32A Depression, unspecified: Secondary | ICD-10-CM | POA: Diagnosis present

## 2023-12-17 DIAGNOSIS — Z818 Family history of other mental and behavioral disorders: Secondary | ICD-10-CM | POA: Diagnosis not present

## 2023-12-17 DIAGNOSIS — J9811 Atelectasis: Secondary | ICD-10-CM | POA: Diagnosis not present

## 2023-12-17 DIAGNOSIS — G9349 Other encephalopathy: Secondary | ICD-10-CM | POA: Diagnosis present

## 2023-12-17 DIAGNOSIS — M1711 Unilateral primary osteoarthritis, right knee: Secondary | ICD-10-CM | POA: Diagnosis present

## 2023-12-17 DIAGNOSIS — R32 Unspecified urinary incontinence: Secondary | ICD-10-CM | POA: Diagnosis not present

## 2023-12-17 DIAGNOSIS — R410 Disorientation, unspecified: Secondary | ICD-10-CM | POA: Diagnosis not present

## 2023-12-17 DIAGNOSIS — R5381 Other malaise: Secondary | ICD-10-CM | POA: Diagnosis not present

## 2023-12-17 DIAGNOSIS — F22 Delusional disorders: Secondary | ICD-10-CM | POA: Diagnosis present

## 2023-12-17 DIAGNOSIS — G9341 Metabolic encephalopathy: Secondary | ICD-10-CM | POA: Diagnosis not present

## 2023-12-17 DIAGNOSIS — N189 Chronic kidney disease, unspecified: Secondary | ICD-10-CM | POA: Diagnosis present

## 2023-12-17 DIAGNOSIS — Z7409 Other reduced mobility: Secondary | ICD-10-CM | POA: Diagnosis present

## 2023-12-17 DIAGNOSIS — E876 Hypokalemia: Secondary | ICD-10-CM | POA: Diagnosis present

## 2023-12-17 DIAGNOSIS — R42 Dizziness and giddiness: Secondary | ICD-10-CM | POA: Diagnosis present

## 2023-12-17 DIAGNOSIS — G9389 Other specified disorders of brain: Secondary | ICD-10-CM | POA: Diagnosis present

## 2023-12-17 DIAGNOSIS — Z72821 Inadequate sleep hygiene: Secondary | ICD-10-CM | POA: Diagnosis not present

## 2023-12-17 DIAGNOSIS — N183 Chronic kidney disease, stage 3 unspecified: Secondary | ICD-10-CM | POA: Diagnosis present

## 2023-12-17 DIAGNOSIS — R918 Other nonspecific abnormal finding of lung field: Secondary | ICD-10-CM | POA: Diagnosis not present

## 2023-12-17 DIAGNOSIS — F0390 Unspecified dementia without behavioral disturbance: Secondary | ICD-10-CM | POA: Diagnosis not present

## 2023-12-17 DIAGNOSIS — M8580 Other specified disorders of bone density and structure, unspecified site: Secondary | ICD-10-CM | POA: Diagnosis present

## 2023-12-17 DIAGNOSIS — I517 Cardiomegaly: Secondary | ICD-10-CM | POA: Diagnosis not present

## 2023-12-17 DIAGNOSIS — J90 Pleural effusion, not elsewhere classified: Secondary | ICD-10-CM | POA: Diagnosis not present

## 2023-12-17 DIAGNOSIS — Z0389 Encounter for observation for other suspected diseases and conditions ruled out: Secondary | ICD-10-CM | POA: Diagnosis not present

## 2023-12-17 LAB — BLOOD CULTURE ID PANEL (REFLEXED) - BCID2

## 2023-12-17 LAB — BASIC METABOLIC PANEL WITH GFR
Anion gap: 14 (ref 5–15)
BUN: 22 mg/dL (ref 8–23)
CO2: 22 mmol/L (ref 22–32)
Calcium: 8.5 mg/dL — ABNORMAL LOW (ref 8.9–10.3)
Chloride: 102 mmol/L (ref 98–111)
Creatinine, Ser: 1.37 mg/dL — ABNORMAL HIGH (ref 0.44–1.00)
GFR, Estimated: 39 mL/min — ABNORMAL LOW (ref >60)
Glucose, Bld: 121 mg/dL — ABNORMAL HIGH (ref 70–99)
Potassium: 3.4 mmol/L — ABNORMAL LOW (ref 3.5–5.1)
Sodium: 138 mmol/L (ref 135–145)

## 2023-12-17 LAB — ECHOCARDIOGRAM COMPLETE
Area-P 1/2: 4.36 cm2
Height: 62 in
S' Lateral: 2.1 cm
Weight: 2880 [oz_av]

## 2023-12-17 LAB — LIPID PANEL
Cholesterol: 134 mg/dL (ref 0–200)
HDL: 57 mg/dL (ref >40)
LDL Cholesterol: 50 mg/dL (ref 0–99)
Total CHOL/HDL Ratio: 2.4 ratio
Triglycerides: 135 mg/dL (ref <150)
VLDL: 27 mg/dL (ref 0–40)

## 2023-12-17 LAB — URINALYSIS, W/ REFLEX TO CULTURE (INFECTION SUSPECTED)
Bacteria, UA: NONE SEEN
Bilirubin Urine: NEGATIVE
Glucose, UA: NEGATIVE mg/dL
Hgb urine dipstick: NEGATIVE
Ketones, ur: 5 mg/dL — AB
Nitrite: NEGATIVE
Protein, ur: NEGATIVE mg/dL
Specific Gravity, Urine: 1.012 (ref 1.005–1.030)
pH: 6 (ref 5.0–8.0)

## 2023-12-17 LAB — TROPONIN I (HIGH SENSITIVITY): Troponin I (High Sensitivity): 14 ng/L (ref <18)

## 2023-12-17 LAB — CBG MONITORING, ED: Glucose-Capillary: 115 mg/dL — ABNORMAL HIGH (ref 70–99)

## 2023-12-17 LAB — HEMOGLOBIN A1C
Hgb A1c MFr Bld: 5.7 % — ABNORMAL HIGH (ref 4.8–5.6)
Mean Plasma Glucose: 116.89 mg/dL

## 2023-12-17 LAB — AMMONIA: Ammonia: 36 umol/L — ABNORMAL HIGH (ref 9–35)

## 2023-12-17 LAB — VITAMIN B12: Vitamin B-12: 235 pg/mL (ref 180–914)

## 2023-12-17 LAB — FOLATE: Folate: >20 ng/mL (ref >5.9)

## 2023-12-17 MED ORDER — POTASSIUM CHLORIDE CRYS ER 20 MEQ PO TBCR: 40.0000 meq | EXTENDED_RELEASE_TABLET | Freq: Once | ORAL | Status: DC

## 2023-12-17 MED ORDER — CYANOCOBALAMIN 1000 MCG/ML IJ SOLN
1000.0000 ug | Freq: Once | INTRAMUSCULAR | Status: AC
  Administered 2023-12-17: 1000 ug via INTRAMUSCULAR
  Filled 2023-12-17: qty 1

## 2023-12-17 MED ORDER — SODIUM CHLORIDE 0.9 % IV SOLN
200.0000 mg | Freq: Once | INTRAVENOUS | Status: AC
  Administered 2023-12-17: 200 mg via INTRAVENOUS
  Filled 2023-12-17: qty 20

## 2023-12-17 MED ORDER — POTASSIUM CHLORIDE 20 MEQ PO PACK
40.0000 meq | PACK | Freq: Once | ORAL | Status: DC
  Filled 2023-12-17: qty 2

## 2023-12-17 MED ORDER — POTASSIUM CHLORIDE CRYS ER 20 MEQ PO TBCR
40.0000 meq | EXTENDED_RELEASE_TABLET | Freq: Once | ORAL | Status: AC
  Administered 2023-12-17: 40 meq via ORAL
  Filled 2023-12-17: qty 2

## 2023-12-17 MED ORDER — LORAZEPAM 2 MG/ML IJ SOLN
1.0000 mg | Freq: Once | INTRAMUSCULAR | Status: AC
  Administered 2023-12-17: 1 mg via INTRAVENOUS
  Filled 2023-12-17: qty 1

## 2023-12-17 MED ORDER — LACOSAMIDE 50 MG PO TABS
100.0000 mg | ORAL_TABLET | Freq: Two times a day (BID) | ORAL | Status: DC
  Administered 2023-12-17: 100 mg via ORAL
  Filled 2023-12-17: qty 2

## 2023-12-17 MED ORDER — SODIUM CHLORIDE 0.9 % IV SOLN
200.0000 mg | Freq: Two times a day (BID) | INTRAVENOUS | Status: DC
  Administered 2023-12-17 – 2023-12-19 (×4): 200 mg via INTRAVENOUS
  Filled 2023-12-17 (×5): qty 20

## 2023-12-17 MED ORDER — ONDANSETRON HCL 4 MG/2ML IJ SOLN
4.0000 mg | Freq: Four times a day (QID) | INTRAMUSCULAR | Status: DC | PRN
  Administered 2023-12-17: 4 mg via INTRAVENOUS
  Filled 2023-12-17: qty 2

## 2023-12-17 NOTE — ED Notes (Signed)
  at bedside

## 2023-12-17 NOTE — Care Management Obs Status (Signed)
 MEDICARE OBSERVATION STATUS NOTIFICATION   Patient Details  Name: [name] MRN: [id number] Date of Birth: June 10, 1945   Medicare Observation Status Notification Given:  Yes    Nena LITTIE Coffee, RN 12/17/2023, 11:55 AM
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
## 2023-12-17 NOTE — ED Notes (Signed)
 Clint Kill NP at bedside.

## 2023-12-17 NOTE — Progress Notes (Signed)
 LTM EEG hooked up and running - no initial skin breakdown - push button tested - NOT monitored by [facility] while in ER.

## 2023-12-17 NOTE — ED Notes (Signed)
 This RN assumes care of this PT at this time

## 2023-12-17 NOTE — ED Notes (Signed)
 Transport attempted to get pt for MRA, upon going into room pt became agitated and need these things on my finger off, no paying or charging for that. Pt reoriented with no success. MD messaged for meds for scans. Pt then began to force a cough and dry heave, diaphoretic and anxious. PRN zofran  and ativan  given per MAR. Pt had urinated, brief was changed and pt was able to urinate more in the bedpan.

## 2023-12-17 NOTE — ED Notes (Signed)
 Pt back from MRI

## 2023-12-17 NOTE — Progress Notes (Signed)
 Bilateral lower extremity venous duplex and carotid artery duplex has been completed. Preliminary results can be found in CV Proc through chart review.   12/17/23 11:07 AM Cathlyn Collet RVT

## 2023-12-17 NOTE — Consult Note (Incomplete)
 Neurology Consultation Reason for Consult: Altered mental status Referring Physician: Tobie, ED  CC: Altered mental status  History is obtained from: Chart review  HPI: [name] is a 78 y.o. female with a history of anxiety, hypertension, hyperlipidemia, previous history of seizures   LKW: *** tnk given?: no, *** Premorbid modified rankin scale: *** ICH Score: ***    ROS: A 14 point ROS was performed and is negative except as noted in the HPI. *** Unable to obtain due to altered mental status.   Past Medical History:  Diagnosis Date  . Allergy 1980's   Sulfa drugs and penecillin  . Anxiety   . Cataract 2021   Corrected  . Chronic kidney disease 2024   Stage 3  . Depression   . Headache    SINUS  . Hyperlipidemia   . Hypertension 2021  . Osteopenia   . Osteopenia   . Primary localized osteoarthritis of right knee   . Seizures (HCC) 10/2023   ***  Family History  Problem Relation Age of Onset  . Heart disease Mother   . Arthritis Mother   . Heart failure Mother   . Heart attack Mother   . Anxiety disorder Mother   . Depression Mother   . Hearing loss Mother   . Varicose Veins Mother   . Early death Father   . Heart attack Father   . Hearing loss Father   . Heart disease Father   . Hyperlipidemia Sister   . Heart disease Sister   . Hearing loss Sister   . Arthritis Sister   . Irregular heart beat Sister   . Hypertension Sister   . Hypertension Brother   . Hyperlipidemia Brother   . Heart attack Brother        [name]  . Asthma Brother   . Alcohol abuse Brother   . Heart disease Brother   . Early death Brother   . Heart attack Brother   . Depression Son   . Alcohol abuse Maternal Aunt   . Alcohol abuse Maternal Aunt    ***  Social History:  reports that she has never smoked. She has never used smokeless tobacco. She reports that she does not currently use alcohol. She reports that she does not use  drugs. ***  Exam: Current vital signs: BP (!) 140/82   Pulse 98   Temp 98.7 F (37.1 C) (Oral)   Resp 17   Ht 5' 2 (1.575 m)   Wt 81.6 kg   SpO2 97%   BMI 32.92 kg/m  Vital signs in last 24 hours: Temp:  [98.2 F (36.8 C)-98.7 F (37.1 C)] 98.7 F (37.1 C) (11/02 1933) Pulse Rate:  [85-107] 98 (11/02 2330) Resp:  [17-33] 17 (11/02 2200) BP: (80-151)/(33-91) 140/82 (11/02 2330) SpO2:  [95 %-100 %] 97 % (11/02 2330) Weight:  [81.6 kg] 81.6 kg (11/02 1038)   Physical Exam  Appears well-developed and well-nourished.   Neuro: Mental Status: Patient is awake, alert, oriented to person, place, month, year, and situation.*** Patient is able to give a clear and coherent history.*** No signs of aphasia or neglect*** Cranial Nerves: II: Visual Fields are full. Pupils are equal, round, and reactive to light.  *** III,IV, VI: EOMI without ptosis or diploplia.  V: Facial sensation is symmetric to temperature VII: Facial movement is symmetric.  VIII: hearing is intact to voice X: Uvula elevates symmetrically XI: Shoulder shrug is symmetric. XII: tongue is midline without atrophy or fasciculations.  Motor: Tone is normal. Bulk is normal. 5/5 strength was present in all four extremities. *** Sensory: Sensation is symmetric to light touch and temperature in the arms and legs.*** Deep Tendon Reflexes: 2+ and symmetric in the biceps and patellae. *** Plantars: Toes are downgoing bilaterally. *** Cerebellar: FNF and HKS are intact bilaterally***      I have reviewed labs in epic and the results pertinent to this consultation are: ***  I have reviewed the images obtained:***  Impression: ***  Recommendations: 1) ***   [name], MD [facility]   If 7pm- 7am, please page neurology on call as listed in AMION.

## 2023-12-17 NOTE — Progress Notes (Signed)
 NEUROLOGY CONSULT FOLLOW UP NOTE   Date of service: December 17, 2023 Patient Name: [name] MRN:  [id number] DOB:  Mar 07, 1945  Interval Hx/subjective  Patient has remained hemodynamically stable and afebrile overnight. However, she continues to be confused and somewhat aphasic. Will start patient on LTM EEG today.  Vitals   Vitals:   12/17/23 0509 12/17/23 0530 12/17/23 0600 12/17/23 0615  BP:  125/71 (!) 140/76   Pulse:  96 87 84  Resp:   18 18  Temp: (!) 97.5 F (36.4 C)     TempSrc: Axillary     SpO2:  91% 98% 100%  Weight:      Height:         Body mass index is 32.92 kg/m.  Physical Exam   Constitutional: Appears well-developed and well-nourished.  Psych: Appears slightly anxious Eyes: No scleral injection.  HENT: No OP obstrucion.  Head: Normocephalic.  Cardiovascular: Normal rate and regular rhythm.  Respiratory: Effort normal, non-labored breathing.  Skin: WDI.   Neurologic Examination   Mental Status: Able to state full name, able to state she is in a healthcare facility but cannot state which one or what type, disoriented to time and unable to state age.  She is able to follow single step commands but has difficulty with two-step commands Speech/Language: speech is without dysarthria but with moderate expressive aphasia and difficulties in naming Cranial Nerves:  II: PERRL.  Inconsistently blinks to threat on the left III, IV, VI: Possible right gaze preference V: Sensation is intact to light touch and symmetrical to face.  VII: Smile is symmetrical.  VIII: hearing intact to voice. IX, X: Phonation is normal.  XII: tongue is midline without fasciculations. Motor: Able to move all 4 extremities symmetrically with antigravity strength Tone: is normal and bulk is normal Sensation- Intact to light touch bilaterally.   Coordination: FTN intact bilaterally Gait- deferred  NIHSS:  1a Level of Conscious.: 0 1b LOC Questions: 2 1c LOC Commands:  0 2 Best Gaze: 1 3 Visual: 1 4 Facial Palsy: 0 5a Motor Arm - left: 0 5b Motor Arm - Right: 0 6a Motor Leg - Left: 0 6b Motor Leg - Right: 0 7 Limb Ataxia: 0 8 Sensory: 0 9 Best Language: 1 10 Dysarthria: 0 11 Extinct and Inattention.: 0 TOTAL: 5     Medications  Current Facility-Administered Medications:    0.9 %  sodium chloride  infusion, , Intravenous, Continuous, Tobie Mario GAILS, MD, Stopped at 12/17/23 0127   aspirin EC tablet 81 mg, 81 mg, Oral, Daily, Patel, Ekta V, MD   atorvastatin  (LIPITOR) tablet 40 mg, 40 mg, Oral, Daily, Patel, Ekta V, MD, 40 mg at 12/16/23 2030   heparin injection 5,000 Units, 5,000 Units, Subcutaneous, Q8H, Tobie Mario V, MD, 5,000 Units at 12/17/23 0606   lacosamide  (VIMPAT ) tablet 100 mg, 100 mg, Oral, BID, Michaela Aisha SQUIBB, MD   ondansetron  (ZOFRAN ) injection 4 mg, 4 mg, Intravenous, Q6H PRN, Howerter, Justin B, DO, 4 mg at 12/17/23 0132   pantoprazole  (PROTONIX ) injection 40 mg, 40 mg, Intravenous, Q12H, Tobie Mario V, MD, 40 mg at 12/16/23 1701   potassium chloride  SA (KLOR-CON  M) CR tablet 40 mEq, 40 mEq, Oral, Once, Howerter, Justin B, DO   raloxifene  (EVISTA ) tablet 60 mg, 60 mg, Oral, Daily, Tobie Mario V, MD, 60 mg at 12/16/23 1658   sodium chloride  flush (NS) 0.9 % injection 3 mL, 3 mL, Intravenous, Q12H, Tobie Mario V, MD, 3 mL at 12/16/23  2233   venlafaxine  XR (EFFEXOR -XR) 24 hr capsule 150 mg, 150 mg, Oral, Q breakfast, Tobie Mario GAILS, MD  Current Outpatient Medications:    acetaminophen  (TYLENOL ) 500 MG tablet, Take 500 mg by mouth every 6 (six) hours as needed for moderate pain (pain score 4-6)., Disp: , Rfl:    amLODipine -benazepril  (LOTREL ) 5-20 MG capsule, Take 1 capsule by mouth daily., Disp: 90 capsule, Rfl: 3   atorvastatin  (LIPITOR) 20 MG tablet, Take 1 tablet (20 mg total) by mouth daily., Disp: 90 tablet, Rfl: 3   cyanocobalamin  1000 MCG tablet, Take 0.5 tablets (500 mcg total) by mouth daily., Disp: 90 tablet, Rfl: 0    cyclobenzaprine  (FLEXERIL ) 5 MG tablet, Take 1 tablet (5 mg total) by mouth 3 (three) times daily as needed for muscle spasms., Disp: 15 tablet, Rfl: 0   ibuprofen (ADVIL) 600 MG tablet, Take 1 tablet (600 mg total) by mouth every 8 (eight) hours as needed., Disp: 60 tablet, Rfl: 1   lacosamide  (VIMPAT ) 50 MG TABS tablet, Take 1 tablet (50 mg total) by mouth 2 (two) times daily., Disp: 180 tablet, Rfl: 2   Multiple Vitamins-Minerals (MULTIVITAMIN WOMEN 50+) TABS, Take 1 tablet by mouth daily., Disp: , Rfl:    omeprazole  (PRILOSEC) 40 MG capsule, Take 1 capsule (40 mg total) by mouth daily., Disp: 90 capsule, Rfl: 1   OVER THE COUNTER MEDICATION, Take 1 capsule by mouth every 12 (twelve) hours as needed (headache, cold/flu-like symptoms). OTC cold capsules, Disp: , Rfl:    Polyethyl Glycol-Propyl Glycol 0.4-0.3 % SOLN, Place 1 drop into both eyes at bedtime., Disp: , Rfl:    QUEtiapine  (SEROQUEL ) 25 MG tablet, Take 25 mg by mouth at bedtime., Disp: , Rfl:    raloxifene  (EVISTA ) 60 MG tablet, Take 1 tablet (60 mg total) by mouth daily., Disp: 90 tablet, Rfl: 3   triamterene -hydrochlorothiazide  (DYAZIDE ) 37.5-25 MG capsule, Take 1 capsule by mouth daily., Disp: 90 capsule, Rfl: 3   venlafaxine  XR (EFFEXOR -XR) 150 MG 24 hr capsule, Take 1 capsule (150 mg total) by mouth daily with breakfast., Disp: 90 capsule, Rfl: 0   zolpidem  (AMBIEN ) 5 MG tablet, Take 1-1.5 tablets (5-7.5 mg total) by mouth at bedtime as needed for sleep., Disp: 45 tablet, Rfl: 1  Labs and Diagnostic Imaging   CBC:  Recent Labs  Lab 12/16/23 1043 12/16/23 1203 12/16/23 2112  WBC 14.3*  --   --   NEUTROABS 12.6*  --   --   HGB 12.4 13.3 10.2*  HCT 38.3 39.0 31.5*  MCV 83.8  --   --   PLT 505*  --   --     Basic Metabolic Panel:  Lab Results  Component Value Date   NA 138 12/17/2023   K 3.4 (L) 12/17/2023   CO2 22 12/17/2023   GLUCOSE 121 (H) 12/17/2023   BUN 22 12/17/2023   CREATININE 1.37 (H) 12/17/2023    CALCIUM  8.5 (L) 12/17/2023   GFRNONAA 39 (L) 12/17/2023   GFRAA 53 (L) 06/30/2020   Lipid Panel:  Lab Results  Component Value Date   LDLCALC 50 12/17/2023   HgbA1c:  Lab Results  Component Value Date   HGBA1C 5.7 (H) 12/17/2023   Alcohol Level     Component Value Date/Time   [facility] <15 12/16/2023 1040   INR  Lab Results  Component Value Date   INR 1.1 12/16/2023   APTT  Lab Results  Component Value Date   APTT <22 (L) 12/16/2023  UDS pending B12 235 Methylmalonic acid pending Ammonia pending Folate greater than 20 HIV negative RPR nonreactive TSH 1.444 Beta hydroxybutyric acid 1.41 Urinalysis negative for UTI  MR Angio head without contrast and Carotid Duplex BL(Personally reviewed): Normal intracranial MRA. No large vessel occlusion or other vascular abnormality. No hemodynamically significant or correctable stenosis.  MRI Brain(Personally reviewed): Punctate 3 mm focus of restricted diffusion and mesial right temporal lobe/hippocampal formation  Continuous EEG:  Pending  Assessment  Kwana Ringel is a 78 y.o. female with history of anxiety, hypertension, hyperlipidemia and recent prior suspected seizure provoked in the setting of THC gummy use who presents again with altered mental status.  She initially reported that she has been having speech difficulties as well as balance difficulties for the past few days.  She was admitted in September with altered mental status in the setting of having taken a THC gummy to help with insomnia.  Patient reports that she has not used any more of these Gummies. [name] states that she was in her usual state of health until Friday, when she had several falls after suddenly losing her balance.  [name] states that she fell several times on Friday, Saturday and Sunday and began having difficulties with speech and confusion during that time.  He reports that she did take a 10 mg THC gummy to help with  sleep about a week ago. Will check UDS.  - [name], she is oriented to self and is able to states she is in a healthcare facility but otherwise disoriented to time and situation.  Moderate expressive aphasia is also noted.  She does have a questionable left gaze preference and inconsistently blinks to threat on the left.  She has subtle facial twitching, and twitching of bilateral arms was noted after exam, but this resolved spontaneously and patient was speaking, although confused, when it was going on, further increasing suspicion for seizure activity.   - She was found to have a B12 deficiency on previous admission, and cyanocobalamin  prescription was not filled.  B12 today is 235. Will supplement with IM cyanocobalamin .   - Other AMS labs have been normal so far, although awaiting ammonia result. Beta-hydroxybutyrate acid is elevated, but unsure of the significance of this as she is not hyperglycemic.   - Patient does have a punctate right hippocampal DWI abnormality on MRI, but this does not explain her altered mental status and aphasia.  - Impression: Primary component of the DDx is subclinical seizure activity as the etiology for her altered mental status and aphasia.  Recommendations  - Check A1c and LDL + add statin per guidelines - Aspirin 81 mg daily antiplt/anticoag - Tele - PT/OT/SLP - Continuous EEG - Seizure precautions - Continue Vimpat  100 mg twice daily (increased from her home dose of 50 mg BID) - Cyanocobalamin  injection 1000 mcg today x 1 then weekly x 4 weeks, followed by oral supplementation   ______________________________________________________________________ Patient seen by NP and then by MD, MD to edit note as needed.  Signed, Cortney E Everitt Clint Kill, NP [facility]   Electronically signed: Dr. Theophile Harvie

## 2023-12-17 NOTE — ED Notes (Signed)
 MD made aware that pt NIH increased from 3 to 8. This RN went in to do neuro check and asked pt her name and where she was. pt was unable to get anything right this time . after I was in there a few minutes she got the questions right a few minutes after I corrected her. MD and neurology aware of changes

## 2023-12-17 NOTE — Progress Notes (Signed)
 PROGRESS NOTE    [name]  FMW:[id number] DOB: 05-Nov-1945 DOA: 12/16/2023 PCP: Wendolyn Jenkins Jansky, MD   Brief Narrative:  This 78 yrs old female with PMH significant for hypertension, hyperlipidemia, anxiety, previous history of stroke presented in the ED with altered mental status.  Patient reports that she has been having difficulty with her speech over the last couple of days as well as difficulty with her balance.  On her prior hospitalization she was monitored on EEG which did not demonstrate any seizure activity.  Patient was started on Vimpat  50 mg twice daily, it is uncertain if she is taking medicine.  LP was attempted but was unsuccessful even with fluoroscopy guidance.  Encephalopathy has  improved and She was discharged home. Workup in the ED reveals CT head negative for acute intracranial abnormality.  Patient was admitted for further evaluation.  Neurology is consulted.  Assessment & Plan:   Principal Problem:   Dizziness Active Problems:   Essential hypertension  Dizziness: Patient presented with dizziness and subsequent fall likely due to dehydration and hypovolemia. Continue IV fluid resuscitation, Continue fall precautions. PT/ OT Evaluation.  Dysarthria: MRI concerning for stroke. Neurology consulted recommended to continue with the stroke workup. Obtain 2D echocardiogram, PT and OT evaluation. Continue EEG monitoring. MRA > No large vessel occlusion or other vascular abnormality. Vit B12 235, Ammonia level 36, hemoglobin A1c 5.7, May consider repeating LP if no improvement.   Anion gap metabolic acidosis: Likely due to lactic acidosis which is resolved with IV hydration.  Chronic diarrhea: Patient has a GI appointment that is upcoming. This could be Infectious  or GIB .   Obtain Stool studies. Occult blood.  Less likely inflammatory or microscopic colitis or malignancy. Follow up outpatient.   Hypokalemia: Replaced.  Continue to monitor   Acute  kidney injury: Baseline serum creatinine normal.  Presented with creatinine of 2.04 Continue with IV hydration,  avoid nephrotoxic medications,  serum creatinine improving.  Generalized anxiety disorder / depression: Continue venlafaxine .   Seizure disorder: Continue Vimpat .   Continue seizure precautions,  aspiration and fall precautions.  Suspect pt had neuro event from hypoxia in September admission less likely seizure,    DVT prophylaxis: Heparin Code Status:Full code Family Communication: Family at bed side. Disposition Plan:    Status is: Observation The patient remains OBS appropriate and will d/c before 2 midnights.   Admitted for acute encephalopathy likely multifactorial.  Workup is in process , Neurology is consulted.  Consultants:  Neurology  Procedures: CT Head.  Antimicrobials:  Anti-infectives (From admission, onward)    None      Subjective: Patient was seen and examined at bedside.  Overnight events noted. Patient reports she is feeling better but appears slightly confused,  family at bedside.  Objective: Vitals:   12/17/23 0615 12/17/23 0800 12/17/23 0939 12/17/23 0950  BP:  (!) 127/92  137/63  Pulse: 84 89  90  Resp: 18 16  18   Temp:   98.2 F (36.8 C)   TempSrc:   Oral   SpO2: 100% 99%  97%  Weight:      Height:        Intake/Output Summary (Last 24 hours) at 12/17/2023 1157 Last data filed at 12/17/2023 0726 Gross per 24 hour  Intake 983.72 ml  Output --  Net 983.72 ml   Filed Weights   12/16/23 1038  Weight: 81.6 kg    Examination:  General exam: Appears calm and comfortable, not in any acute distress.  Respiratory system: CTA Bilaterally.  Respiratory effort normal.  RR 15 Cardiovascular system: S1 & S2 heard, RRR. No JVD, murmurs, rubs, gallops or clicks. No pedal edema. Gastrointestinal system: Abdomen is non distended, soft and non tender. Normal bowel sounds heard. Central nervous system: Alert and oriented x 3. No focal  neurological deficits. Extremities: No edema, no cyanosis, no clubbing. Skin: No rashes, lesions or ulcers Psychiatry: Judgement and insight appear normal. Mood & affect appropriate.     Data Reviewed: I have personally reviewed following labs and imaging studies  CBC: Recent Labs  Lab 12/16/23 1043 12/16/23 1203 12/16/23 2112  WBC 14.3*  --   --   NEUTROABS 12.6*  --   --   HGB 12.4 13.3 10.2*  HCT 38.3 39.0 31.5*  MCV 83.8  --   --   PLT 505*  --   --    Basic Metabolic Panel: Recent Labs  Lab 12/16/23 1043 12/16/23 1203 12/16/23 1658 12/16/23 2027 12/17/23 0602  NA 134* 133*  --  135 138  K 3.4* 3.2*  --  3.0* 3.4*  CL 95* 101  --  100 102  CO2 15*  --   --  19* 22  GLUCOSE 133* 128*  --  105* 121*  BUN 22 23  --  24* 22  CREATININE 2.04* 2.10*  --  1.75* 1.37*  CALCIUM  9.3  --   --  8.4* 8.5*  MG  --   --  2.1 1.9  --    GFR: Estimated Creatinine Clearance: 33 mL/min (A) (by C-G formula based on SCr of 1.37 mg/dL (H)). Liver Function Tests: Recent Labs  Lab 12/16/23 1043  AST 33  ALT 25  ALKPHOS 103  BILITOT 1.1  PROT 7.5  ALBUMIN 4.1   No results for input(s): LIPASE, AMYLASE in the last 168 hours. Recent Labs  Lab 12/17/23 0602  AMMONIA 36*   Coagulation Profile: Recent Labs  Lab 12/16/23 1043  INR 1.1   Cardiac Enzymes: Recent Labs  Lab 12/16/23 1658  CKTOTAL 230   BNP (last 3 results) No results for input(s): PROBNP in the last 8760 hours. HbA1C: Recent Labs    12/16/23 1658 12/17/23 0602  HGBA1C 5.7* 5.7*   CBG: Recent Labs  Lab 12/16/23 1034 12/17/23 0644  GLUCAP 141* 115*   Lipid Profile: Recent Labs    12/17/23 0602  CHOL 134  HDL 57  LDLCALC 50  TRIG 135  CHOLHDL 2.4   Thyroid  Function Tests: Recent Labs    12/16/23 1658  TSH 1.444  FREET4 0.98   Anemia Panel: Recent Labs    12/17/23 0602  VITAMINB12 235  FOLATE >20.0   Sepsis Labs: Recent Labs  Lab 12/16/23 1434 12/16/23 1700  12/16/23 2032 12/16/23 2225  LATICACIDVEN 2.0* 3.1* 1.2 1.0    Recent Results (from the past 240 hours)  Blood culture (routine x 2)     Status: None (Preliminary result)   Collection Time: 12/16/23  8:25 PM   Specimen: BLOOD  Result Value Ref Range Status   Specimen Description BLOOD LEFT ANTECUBITAL  Final   Special Requests   Final    BOTTLES DRAWN AEROBIC AND ANAEROBIC Blood Culture results may not be optimal due to an inadequate volume of blood received in culture bottles   Culture   Final    NO GROWTH < 12 HOURS Performed at [facility], [address] [place], [place] [address]    Report Status PENDING  Incomplete  Blood culture (routine x 2)     Status: None (Preliminary result)   Collection Time: 12/16/23  8:30 PM   Specimen: BLOOD LEFT FOREARM  Result Value Ref Range Status   Specimen Description BLOOD LEFT FOREARM  Final   Special Requests   Final    BOTTLES DRAWN AEROBIC AND ANAEROBIC Blood Culture results may not be optimal due to an inadequate volume of blood received in culture bottles   Culture   Final    NO GROWTH < 12 HOURS Performed at [facility], [address] [place], [place] [address]    Report Status PENDING  Incomplete    Radiology Studies: ECHOCARDIOGRAM COMPLETE Result Date: 12/17/2023    ECHOCARDIOGRAM REPORT   Patient Name:   YEMAYA BARNIER [facility] Date of Exam: 12/17/2023 Medical Rec #:  [id number]            Height:       62.0 in Accession #:    [id number]           Weight:       180.0 lb Date of Birth:  07-13-1945            BSA:          1.828 m Patient Age:    78 years             BP:           114/49 mmHg Patient Gender: F                    HR:           74 bpm. Exam Location:  Inpatient Procedure: 2D Echo (Both Spectral and Color Flow Doppler were utilized during            procedure). Indications:    TIA/dizziness  History:        Patient has prior history of Echocardiogram examinations. Risk                 Factors:Hypertension.   Sonographer:    Charmaine Gaskins Referring Phys: [phone] EKTA V PATEL  Sonographer Comments: Technically challenging study due to limited acoustic windows. Image acquisition challenging due to uncooperative patient. IMPRESSIONS  1. Left ventricular ejection fraction, by estimation, is 65 to 70%. The left ventricle has normal function. The left ventricle has no regional wall motion abnormalities. Left ventricular diastolic parameters are consistent with Grade I diastolic dysfunction (impaired relaxation).  2. Right ventricular systolic function is normal. The right ventricular size is normal.  3. The mitral valve is normal in structure. No evidence of mitral valve regurgitation. No evidence of mitral stenosis.  4. The aortic valve is normal in structure. There is moderate calcification of the aortic valve. Aortic valve regurgitation is not visualized. No aortic stenosis is present.  5. The inferior vena cava is normal in size with greater than 50% respiratory variability, suggesting right atrial pressure of 3 mmHg. FINDINGS  Left Ventricle: Left ventricular ejection fraction, by estimation, is 65 to 70%. The left ventricle has normal function. The left ventricle has no regional wall motion abnormalities. The left ventricular internal cavity size was normal in size. There is  no left ventricular hypertrophy. Left ventricular diastolic parameters are consistent with Grade I diastolic dysfunction (impaired relaxation). Right Ventricle: The right ventricular size is normal. No increase in right ventricular wall thickness. Right ventricular systolic function is normal. Left Atrium: Left atrial size was normal in size. Right  Atrium: Right atrial size was normal in size. Pericardium: There is no evidence of pericardial effusion. Mitral Valve: The mitral valve is normal in structure. No evidence of mitral valve regurgitation. No evidence of mitral valve stenosis. Tricuspid Valve: The tricuspid valve is normal in structure.  Tricuspid valve regurgitation is trivial. No evidence of tricuspid stenosis. Aortic Valve: The aortic valve is normal in structure. There is moderate calcification of the aortic valve. Aortic valve regurgitation is not visualized. No aortic stenosis is present. Pulmonic Valve: The pulmonic valve was not well visualized. Pulmonic valve regurgitation is not visualized. No evidence of pulmonic stenosis. Aorta: The aortic root is normal in size and structure. Venous: The inferior vena cava is normal in size with greater than 50% respiratory variability, suggesting right atrial pressure of 3 mmHg. IAS/Shunts: No atrial level shunt detected by color flow Doppler.  LEFT VENTRICLE PLAX 2D LVIDd:         4.70 cm   Diastology LVIDs:         2.10 cm   LV e' medial:    7.51 cm/s LV PW:         1.00 cm   LV E/e' medial:  12.3 LV IVS:        0.80 cm   LV e' lateral:   6.42 cm/s LVOT diam:     1.90 cm   LV E/e' lateral: 14.4 LVOT Area:     2.84 cm  RIGHT VENTRICLE RV Basal diam:  3.30 cm RV Mid diam:    2.90 cm RV S prime:     13.20 cm/s LEFT ATRIUM             Index        RIGHT ATRIUM           Index LA diam:        3.10 cm 1.70 cm/m   RA Area:     11.90 cm LA Vol (A2C):   37.1 ml 20.30 ml/m  RA Volume:   27.30 ml  14.93 ml/m LA Vol (A4C):   47.6 ml 26.04 ml/m LA Biplane Vol: 45.5 ml 24.89 ml/m   AORTA Ao Root diam: 2.40 cm Ao Asc diam:  2.60 cm MITRAL VALVE MV Area (PHT): 4.36 cm     SHUNTS MV Decel Time: 174 msec     Systemic Diam: 1.90 cm MV E velocity: 92.50 cm/s MV A velocity: 123.00 cm/s MV E/A ratio:  0.75 Franck Azobou Tonleu Electronically signed by Joelle Cedars Tonleu Signature Date/Time: 12/17/2023/10:23:29 AM    Final    MR ANGIO HEAD WO CONTRAST Result Date: 12/17/2023 CLINICAL DATA:  Initial evaluation for acute neuro deficit, stroke suspected. EXAM: MRA HEAD WITHOUT CONTRAST TECHNIQUE: Angiographic images of the Circle of Willis were acquired using MRA technique without intravenous contrast. COMPARISON:   Comparison made with prior brain MRI from 12/16/2023. FINDINGS: Anterior circulation: Both internal carotid arteries are widely patent through the siphons without stenosis or other abnormality. Small funnel shaped outpouching extending laterally from the cavernous right ICA noted, consistent with a small vascular infundibulum (series 9, image 61). A1 segments patent bilaterally. Normal anterior communicating artery complex. Anterior cerebral arteries patent without stenosis. No M1 stenosis or occlusion. No proximal MCA branch occlusion or high-grade stenosis. Distal MCA branches perfused and symmetric. Posterior circulation: Visualized distal right V4 segment is patent without stenosis. Right vertebral artery dominant. Partially visualized distal left V4 segment is markedly hypoplastic but grossly patent as well. Left PICA patent at its  takeoff. Right PICA origin not seen. Basilar patent without stenosis. Superior cerebral arteries patent bilaterally. Both PCA supplied via hypoplastic P1 segments and robust bilateral posterior communicating arteries. Both PCAs patent to their distal aspects without stenosis. Anatomic variants: As above. Other: No intracranial aneurysm. IMPRESSION: Normal intracranial MRA. No large vessel occlusion or other vascular abnormality. No hemodynamically significant or correctable stenosis. Electronically Signed   By: Morene Hoard M.D.   On: 12/17/2023 02:36   MR BRAIN WO CONTRAST Result Date: 12/16/2023 EXAM: MRI BRAIN WITHOUT CONTRAST 12/16/2023 05:50:56 PM TECHNIQUE: Multiplanar multisequence MRI of the head/brain was performed without the administration of intravenous contrast. COMPARISON: Compared to CT from earlier the same day. CLINICAL HISTORY: FINDINGS: BRAIN AND VENTRICLES: Punctate 3 mm focus of restricted diffusion seen involving the mesial right temporal lobe/hippocampal formation (series 5, 67). No associated hemorrhage. No mass. No midline shift. No hydrocephalus.  The sella is unremarkable. Normal flow voids. ORBITS: Prior bilateral ocular lens replacement. SINUSES AND MASTOIDS: No acute abnormality. BONES AND SOFT TISSUES: Normal marrow signal. No acute soft tissue abnormality. IMPRESSION: 1. Punctate 3 mm focus of restricted diffusion involving the mesial right temporal lobe/hippocampal formation. No associated hemorrhage. Primary differential considerations include a punctate acute ischemic nonhemorrhagic infarct versus changes of transient global amnesia. 2. Otherwise essentially normal brain MRI for age. Electronically signed by: Morene Hoard MD 12/16/2023 06:35 PM EST RP Workstation: [id number]   DG Hip Unilat W or Wo Pelvis 2-3 Views Right Result Date: 12/16/2023 EXAM: 2 or 3 VIEW(S) XRAY OF THE RIGHT HIP 12/16/2023 01:31:00 PM COMPARISON: CT abdomen and pelvis 02/05/2022. CLINICAL HISTORY: pain bruising after fall FINDINGS: BONES AND JOINTS: No acute fracture or focal osseous lesion is identified in the right hip. The right hip joint is maintained. No significant degenerative changes are seen in the right hip. There is a small sclerotic lesion in the left femoral head, most likely a bone island. SOFT TISSUES: The soft tissues are unremarkable. IMPRESSION: 1. No acute fracture or dislocation. Electronically signed by: Greig Pique MD 12/16/2023 01:45 PM EST RP Workstation: [id number]   DG Knee Complete 4 Views Right Result Date: 12/16/2023 EXAM: 4 VIEW(S) XRAY OF THE RIGHT KNEE 12/16/2023 01:31:00 PM COMPARISON: None available. CLINICAL HISTORY: pain bruising after fall FINDINGS: BONES AND JOINTS: Right knee total arthroplasty is in anatomic alignment. No evidence for hardware loosening. No acute fracture. No focal osseous lesion. No joint dislocation. No significant joint effusion. SOFT TISSUES: The soft tissues are unremarkable. IMPRESSION: 1. No acute findings. 2. Right knee total arthroplasty in anatomic alignment without evidence of hardware  loosening. Electronically signed by: Greig Pique MD 12/16/2023 01:44 PM EST RP Workstation: [id number]   DG Ribs Unilateral Left Result Date: 12/16/2023 EXAM: 2 VIEW(S) XRAY OF THE LEFT RIBS 12/16/2023 01:31:00 PM COMPARISON: None available. CLINICAL HISTORY: 855384 Pain 144615; 190176 Fall 190176 Pain 144615; 190176 Fall 190176 FINDINGS: BONES: No acute displaced rib fracture. LUNGS AND PLEURA: Visualized lungs demonstrate no acute abnormality. IMPRESSION: 1. No acute displaced rib fracture. Electronically signed by: Greig Pique MD 12/16/2023 01:42 PM EST RP Workstation: [id number]   DG Shoulder Left Result Date: 12/16/2023 EXAM: 1 VIEW XRAY OF THE LEFT SHOULDER 12/16/2023 01:31:00 PM COMPARISON: None available. CLINICAL HISTORY: Pain bruising after fall. FINDINGS: BONES AND JOINTS: Glenohumeral joint is normally aligned. No acute fracture or dislocation. The [facility] joint is unremarkable in appearance. SOFT TISSUES: No abnormal calcifications. Visualized lung is unremarkable. IMPRESSION: 1. No significant abnormality. Electronically signed by: Greig Pique MD 12/16/2023 01:41  PM EST RP Workstation: [id number]   DG Chest 2 View Result Date: 12/16/2023 EXAM: 2 VIEW(S) XRAY OF THE CHEST 12/16/2023 01:31:00 PM COMPARISON: Chest X-ray 10/17/2023. CLINICAL HISTORY: SOB SOB FINDINGS: LUNGS AND PLEURA: No focal pulmonary opacity. No pulmonary edema. No pleural effusion. No pneumothorax. HEART AND MEDIASTINUM: No acute abnormality of the cardiac and mediastinal silhouettes. BONES AND SOFT TISSUES: No acute osseous abnormality. IMPRESSION: 1. No acute process. Electronically signed by: Greig Pique MD 12/16/2023 01:40 PM EST RP Workstation: [id number]   CT Cervical Spine Wo Contrast Result Date: 12/16/2023 EXAM: CT CERVICAL SPINE WITHOUT CONTRAST 12/16/2023 11:39:12 AM TECHNIQUE: CT of the cervical spine was performed without the administration of intravenous contrast. Multiplanar reformatted images are provided  for review. Automated exposure control, iterative reconstruction, and/or weight based adjustment of the mA/kV was utilized to reduce the radiation dose to as low as reasonably achievable. COMPARISON: None available. CLINICAL HISTORY: Neck trauma (Age >= 65y) FINDINGS: CERVICAL SPINE: BONES AND ALIGNMENT: There is no evidence of fracture or acute traumatic injury. DEGENERATIVE CHANGES: There is slight degenerative anterolisthesis at C3-C4. There is moderate disc space narrowing and mild endplate ridging at C4-C5, C5-C6, and C6-C7. SOFT TISSUES: No prevertebral soft tissue swelling. The paraspinous soft tissues are unremarkable. IMPRESSION: 1. No acute abnormality of the cervical spine. 2. Slight degenerative anterolisthesis at C3-4. 3. Moderate disc space narrowing and mild endplate ridging at C4-5, C5-6, and C6-7. Electronically signed by: Evalene Coho MD 12/16/2023 11:45 AM EST RP Workstation: [id number]   CT HEAD WO CONTRAST Result Date: 12/16/2023 EXAM: CT HEAD WITHOUT CONTRAST 12/16/2023 11:39:12 AM TECHNIQUE: CT of the head was performed without the administration of intravenous contrast. Automated exposure control, iterative reconstruction, and/or weight based adjustment of the mA/kV was utilized to reduce the radiation dose to as low as reasonably achievable. COMPARISON: 10/17/2023 CLINICAL HISTORY: Neuro deficit, acute, stroke suspected. FINDINGS: BRAIN AND VENTRICLES: No acute hemorrhage. No evidence of acute infarct. No hydrocephalus. No extra-axial collection. No mass effect or midline shift. VASCULATURE: Atherosclerosis of skullbase vasculature without hyperdense vessel or abnormal calcification. ORBITS: Bilateral cataract resection. SINUSES: No acute abnormality. SOFT TISSUES AND SKULL: No acute soft tissue abnormality. No skull fracture. Frontal hyperostosis. IMPRESSION: 1. No acute intracranial abnormality related to the suspected stroke. Electronically signed by: Evalene Coho MD 12/16/2023  11:43 AM EST RP Workstation: [id number]   Scheduled Meds:  aspirin EC  81 mg Oral Daily   atorvastatin   40 mg Oral Daily   cyanocobalamin   1,000 mcg Intramuscular Once   heparin  5,000 Units Subcutaneous Q8H   lacosamide   100 mg Oral BID   pantoprazole  (PROTONIX ) IV  40 mg Intravenous Q12H   potassium chloride   40 mEq Oral Once   potassium chloride   40 mEq Oral Once   raloxifene   60 mg Oral Daily   sodium chloride  flush  3 mL Intravenous Q12H   venlafaxine  XR  150 mg Oral Q breakfast   Continuous Infusions:  sodium chloride  Stopped (12/17/23 0127)     LOS: 0 days    Time spent: 50 mins    Darcel Dawley, MD [facility]   If 7PM-7AM, please contact night-coverage

## 2023-12-17 NOTE — ED Notes (Signed)
 MRI contacted that pt is now ready for scans and has been medicated and is more calm. Pt does have ice pack on neck for comfort and HOB slightly elevated dt pt intermittently dry heaving and to decrease aspiration.

## 2023-12-17 NOTE — Progress Notes (Signed)
 Pt admitted from ED with c/o of AMS, dizziness, pt alert to self only but follows simple command, settled in bed with call light and family at bedside, denies any pain at this time, but observed to be anxious and figity, safety concerned explained and initiated, tele monitor put on pt, was however reassured and will continue to monitor, v/s stable. Obasogie-Asidi, [name]

## 2023-12-17 NOTE — ED Notes (Signed)
 EEG tech at bedside.

## 2023-12-17 NOTE — ED Notes (Signed)
 Pt husband took hearing aids home, left pt phone and kindle at bedside.

## 2023-12-17 NOTE — Progress Notes (Signed)
 PT Cancellation Note  Patient Details Name: [name] MRN: [id number] DOB: July 26, 1945   Cancelled Treatment:    Reason Eval/Treat Not Completed: Other (comment). Pt hooked up on continuous EEG and lethargic limited ability to assess mobility from ER stretcher safely. Acute PT to return as able, as appropriate to complete PT eval.  Norene Ames, PT, DPT Acute Rehabilitation Services Secure chat preferred Office #: [phone]    Norene CHRISTELLA Ames 12/17/2023, 12:28 PM

## 2023-12-17 NOTE — ED Notes (Signed)
 Pt found with urine on sheets, bedding changed and new pad applied. Pt given warm blanket and resting comfortably.

## 2023-12-17 NOTE — Progress Notes (Signed)
 PHARMACY - PHYSICIAN COMMUNICATION CRITICAL VALUE ALERT - BLOOD CULTURE IDENTIFICATION (BCID)  [name] is an 78 y.o. female who presented to [facility] on 12/16/2023 with a chief complaint of dizziness  Assessment:  Pt with 1/4 blood cx bottles with staph epi (+mecA) - likely contaminant  Name of physician (or Provider) Contacted: Dr. Leotis  Current antibiotics: None  Changes to prescribed antibiotics recommended:  No antibiotics at this time  Results for orders placed or performed during the hospital encounter of 12/16/23  Blood Culture ID Panel (Reflexed) (Collected: 12/16/2023  8:25 PM)  Result Value Ref Range   Enterococcus faecalis NOT DETECTED NOT DETECTED   Enterococcus Faecium NOT DETECTED NOT DETECTED   Listeria monocytogenes NOT DETECTED NOT DETECTED   Staphylococcus species DETECTED (A) NOT DETECTED   Staphylococcus aureus (BCID) NOT DETECTED NOT DETECTED   Staphylococcus epidermidis DETECTED (A) NOT DETECTED   Staphylococcus lugdunensis NOT DETECTED NOT DETECTED   Streptococcus species NOT DETECTED NOT DETECTED   Streptococcus agalactiae NOT DETECTED NOT DETECTED   Streptococcus pneumoniae NOT DETECTED NOT DETECTED   Streptococcus pyogenes NOT DETECTED NOT DETECTED   A.calcoaceticus-baumannii NOT DETECTED NOT DETECTED   Bacteroides fragilis NOT DETECTED NOT DETECTED   Enterobacterales NOT DETECTED NOT DETECTED   Enterobacter cloacae complex NOT DETECTED NOT DETECTED   Escherichia coli NOT DETECTED NOT DETECTED   Klebsiella aerogenes NOT DETECTED NOT DETECTED   Klebsiella oxytoca NOT DETECTED NOT DETECTED   Klebsiella pneumoniae NOT DETECTED NOT DETECTED   Proteus species NOT DETECTED NOT DETECTED   Salmonella species NOT DETECTED NOT DETECTED   Serratia marcescens NOT DETECTED NOT DETECTED   Haemophilus influenzae NOT DETECTED NOT DETECTED   Neisseria meningitidis NOT DETECTED NOT DETECTED   Pseudomonas aeruginosa NOT DETECTED NOT DETECTED    Stenotrophomonas maltophilia NOT DETECTED NOT DETECTED   Candida albicans NOT DETECTED NOT DETECTED   Candida auris NOT DETECTED NOT DETECTED   Candida glabrata NOT DETECTED NOT DETECTED   Candida krusei NOT DETECTED NOT DETECTED   Candida parapsilosis NOT DETECTED NOT DETECTED   Candida tropicalis NOT DETECTED NOT DETECTED   Cryptococcus neoformans/gattii NOT DETECTED NOT DETECTED   Methicillin resistance mecA/C DETECTED (A) NOT DETECTED    Vito Ralph, PharmD, BCPS Please see amion for complete clinical pharmacist phone list  12/17/2023  5:00 PM

## 2023-12-17 NOTE — ED Notes (Signed)
 Pt taken to MRI

## 2023-12-17 NOTE — ED Notes (Signed)
 Attempted to draw AM labs with no success, messaged phlebotomy to assist with collection.

## 2023-12-18 ENCOUNTER — Inpatient Hospital Stay (HOSPITAL_COMMUNITY)

## 2023-12-18 DIAGNOSIS — T40715A Adverse effect of cannabis, initial encounter: Secondary | ICD-10-CM

## 2023-12-18 DIAGNOSIS — R0602 Shortness of breath: Secondary | ICD-10-CM | POA: Diagnosis not present

## 2023-12-18 DIAGNOSIS — R0989 Other specified symptoms and signs involving the circulatory and respiratory systems: Secondary | ICD-10-CM | POA: Diagnosis not present

## 2023-12-18 DIAGNOSIS — R569 Unspecified convulsions: Secondary | ICD-10-CM | POA: Diagnosis not present

## 2023-12-18 DIAGNOSIS — R42 Dizziness and giddiness: Secondary | ICD-10-CM | POA: Diagnosis not present

## 2023-12-18 DIAGNOSIS — I517 Cardiomegaly: Secondary | ICD-10-CM | POA: Diagnosis not present

## 2023-12-18 DIAGNOSIS — R918 Other nonspecific abnormal finding of lung field: Secondary | ICD-10-CM | POA: Diagnosis not present

## 2023-12-18 DIAGNOSIS — R296 Repeated falls: Secondary | ICD-10-CM | POA: Diagnosis not present

## 2023-12-18 LAB — CBC
HCT: 33.5 % — ABNORMAL LOW (ref 36.0–46.0)
Hemoglobin: 10.8 g/dL — ABNORMAL LOW (ref 12.0–15.0)
MCH: 27.2 pg (ref 26.0–34.0)
MCHC: 32.2 g/dL (ref 30.0–36.0)
MCV: 84.4 fL (ref 80.0–100.0)
Platelets: 454 K/uL — ABNORMAL HIGH (ref 150–400)
RBC: 3.97 MIL/uL (ref 3.87–5.11)
RDW: 16.5 % — ABNORMAL HIGH (ref 11.5–15.5)
WBC: 10.4 K/uL (ref 4.0–10.5)
nRBC: 0 % (ref 0.0–0.2)

## 2023-12-18 LAB — BASIC METABOLIC PANEL WITH GFR
Anion gap: 15 (ref 5–15)
BUN: 24 mg/dL — ABNORMAL HIGH (ref 8–23)
CO2: 20 mmol/L — ABNORMAL LOW (ref 22–32)
Calcium: 8.6 mg/dL — ABNORMAL LOW (ref 8.9–10.3)
Chloride: 105 mmol/L (ref 98–111)
Creatinine, Ser: 1.15 mg/dL — ABNORMAL HIGH (ref 0.44–1.00)
GFR, Estimated: 48 mL/min — ABNORMAL LOW (ref >60)
Glucose, Bld: 133 mg/dL — ABNORMAL HIGH (ref 70–99)
Potassium: 3.3 mmol/L — ABNORMAL LOW (ref 3.5–5.1)
Sodium: 140 mmol/L (ref 135–145)

## 2023-12-18 LAB — MAGNESIUM: Magnesium: 2 mg/dL (ref 1.7–2.4)

## 2023-12-18 LAB — GLUCOSE, CAPILLARY
Glucose-Capillary: 90 mg/dL (ref 70–99)
Glucose-Capillary: 114 mg/dL — ABNORMAL HIGH (ref 70–99)
Glucose-Capillary: 117 mg/dL — ABNORMAL HIGH (ref 70–99)

## 2023-12-18 LAB — PHOSPHORUS: Phosphorus: 3.1 mg/dL (ref 2.5–4.6)

## 2023-12-18 MED ORDER — POTASSIUM CHLORIDE 20 MEQ PO PACK
40.0000 meq | PACK | Freq: Once | ORAL | Status: AC
  Administered 2023-12-18: 40 meq via ORAL

## 2023-12-18 MED ORDER — ZIPRASIDONE MESYLATE 20 MG IM SOLR
20.0000 mg | Freq: Once | INTRAMUSCULAR | Status: DC | PRN
  Filled 2023-12-18: qty 20

## 2023-12-18 MED ORDER — HALOPERIDOL LACTATE 5 MG/ML IJ SOLN
5.0000 mg | Freq: Once | INTRAMUSCULAR | Status: AC
  Administered 2023-12-18: 5 mg via INTRAVENOUS
  Filled 2023-12-18: qty 1

## 2023-12-18 NOTE — TOC Progression Note (Signed)
 Transition of Care [facility]) - Progression Note    Patient Details  Name: [name] MRN: [id number] Date of Birth: 1945-08-25  Transition of Care [facility]) CM/SW Contact  Andrez JULIANNA George, RN Phone Number: 12/18/2023, 3:58 PM  Clinical Narrative:     Pt on LTEEG. DME at home: walker/ shower seat/ BSC/  She was managing her own medications.  Spouse relayed they do have LT care health insurance.   Current recommendations are for CIR.  IP Care management following.   Expected Discharge Plan:  (TBD)                 Expected Discharge Plan and Services                                               Social Drivers of Health (SDOH) Interventions SDOH Screenings   Food Insecurity: No Food Insecurity (12/16/2023)  Housing: Low Risk  (12/16/2023)  Transportation Needs: No Transportation Needs (12/16/2023)  Utilities: Not At Risk (12/16/2023)  Alcohol Screen: Low Risk  (11/23/2023)  Depression (PHQ2-9): High Risk (11/06/2023)  Financial Resource Strain: Low Risk  (11/23/2023)  Physical Activity: Insufficiently Active (11/23/2023)  Social Connections: Moderately Integrated (12/16/2023)  Stress: Stress Concern Present (11/23/2023)  Tobacco Use: Low Risk  (12/16/2023)    Readmission Risk Interventions    10/25/2023    2:06 PM 02/07/2022    8:48 AM  Readmission Risk Prevention Plan  Post Dischage Appt  Complete  Medication Screening  Complete  Transportation Screening Complete Complete  PCP or Specialist Appt within 5-7 Days Complete   Home Care Screening Complete   Medication Review (RN CM) Complete

## 2023-12-18 NOTE — Progress Notes (Signed)
 NEUROLOGY CONSULT FOLLOW UP NOTE   Date of service: December 18, 2023 Patient Name: [name] MRN:  [id number] DOB:  1945-04-28  Interval Hx/subjective  Patient remains hemodynamically stable and afebrile.  She had an episode of agitation overnight and was treated with haloperidol .  She continues to be confused with moderate word finding difficulties.  Awaiting LTM EEG read for today.  Vitals   Vitals:   12/18/23 0037 12/18/23 0400 12/18/23 0500 12/18/23 0826  BP: (!) 163/67 (!) 144/60  (!) (P) 152/70  Pulse: 92 96  (P) 96  Resp: 18 20    Temp: (!) 97.4 F (36.3 C) 98 F (36.7 C)  (P) 97.7 F (36.5 C)  TempSrc: Oral Oral  (P) Oral  SpO2: 95% 95%  (P) 97%  Weight:   82.8 kg   Height:         Body mass index is 33.39 kg/m.  Physical Exam   Constitutional: Appears well-developed and well-nourished.  Psych: Appears slightly anxious Eyes: No scleral injection.  HENT: No OP obstrucion.  Head: Normocephalic.  Cardiovascular: Normal rate and regular rhythm.  Respiratory: Effort normal, non-labored breathing.  Skin: WDI.   Neurologic Examination   Mental Status: Patient is able to state her name but uses her maiden name instead of her married name.  She states she is in [facility] but that she is in [place] .  She is able to state that it is early November. Speech/Language: speech is without dysarthria but with moderate expressive aphasia and word finding difficulties Cranial Nerves:  II: PERRL.   III, IV, VI: Extraocular movements intact V: Sensation is intact to light touch and symmetrical to face.  VII: Smile is symmetrical.  VIII: hearing intact to voice. IX, X: Phonation is normal.  XII: tongue is midline without fasciculations. Motor: Able to move all 4 extremities symmetrically with antigravity strength Tone: is normal and bulk is normal Sensation- Intact to light touch bilaterally.   Coordination: FTN intact bilaterally Gait-  deferred   Medications  Current Facility-Administered Medications:    aspirin EC tablet 81 mg, 81 mg, Oral, Daily, Tobie Modest V, MD, 81 mg at 12/17/23 1347   atorvastatin  (LIPITOR) tablet 40 mg, 40 mg, Oral, Daily, Patel, Ekta V, MD, 40 mg at 12/17/23 1347   heparin injection 5,000 Units, 5,000 Units, Subcutaneous, Q8H, Tobie Modest V, MD, 5,000 Units at 12/18/23 0551   lacosamide  (VIMPAT ) 200 mg in sodium chloride  0.9 % 25 mL IVPB, 200 mg, Intravenous, Q12H, de Clint Kill, [place] E, NP, Last Rate: 90 mL/hr at 12/17/23 2224, 200 mg at 12/17/23 2224   ondansetron  (ZOFRAN ) injection 4 mg, 4 mg, Intravenous, Q6H PRN, Howerter, Justin B, DO, 4 mg at 12/17/23 0132   pantoprazole  (PROTONIX ) injection 40 mg, 40 mg, Intravenous, Q12H, Tobie Modest V, MD, 40 mg at 12/17/23 2225   potassium chloride  (KLOR-CON ) packet 40 mEq, 40 mEq, Oral, Once, Khatri, Pardeep, MD   potassium chloride  (KLOR-CON ) packet 40 mEq, 40 mEq, Oral, Once, Leotis, Pardeep, MD   raloxifene  (EVISTA ) tablet 60 mg, 60 mg, Oral, Daily, Tobie Modest V, MD, 60 mg at 12/16/23 1658   sodium chloride  flush (NS) 0.9 % injection 3 mL, 3 mL, Intravenous, Q12H, Tobie Modest V, MD, 3 mL at 12/17/23 2229   venlafaxine  XR (EFFEXOR -XR) 24 hr capsule 150 mg, 150 mg, Oral, Q breakfast, Tobie Modest V, MD, 150 mg at 12/17/23 1416   ziprasidone  (GEODON ) injection 20 mg, 20 mg, Intramuscular, Once PRN, Howerter,  Eva NOVAK, DO  Labs and Diagnostic Imaging   CBC:  Recent Labs  Lab 12/16/23 1043 12/16/23 1203 12/16/23 2112 12/18/23 0255  WBC 14.3*  --   --  10.4  NEUTROABS 12.6*  --   --   --   HGB 12.4   < > 10.2* 10.8*  HCT 38.3   < > 31.5* 33.5*  MCV 83.8  --   --  84.4  PLT 505*  --   --  454*   < > = values in this interval not displayed.    Basic Metabolic Panel:  Lab Results  Component Value Date   NA 140 12/18/2023   K 3.3 (L) 12/18/2023   CO2 20 (L) 12/18/2023   GLUCOSE 133 (H) 12/18/2023   BUN 24 (H) 12/18/2023   CREATININE 1.15  (H) 12/18/2023   CALCIUM  8.6 (L) 12/18/2023   GFRNONAA 48 (L) 12/18/2023   GFRAA 53 (L) 06/30/2020   Lipid Panel:  Lab Results  Component Value Date   LDLCALC 50 12/17/2023   HgbA1c:  Lab Results  Component Value Date   HGBA1C 5.7 (H) 12/17/2023   Alcohol Level     Component Value Date/Time   ETH <15 12/16/2023 1040   INR  Lab Results  Component Value Date   INR 1.1 12/16/2023   APTT  Lab Results  Component Value Date   APTT <22 (L) 12/16/2023   UDS pending B12 235 Methylmalonic acid pending Ammonia 36 Folate greater than 20 HIV negative RPR nonreactive TSH 1.444 Beta hydroxybutyric acid 1.41 Urinalysis negative for UTI  MR Angio head without contrast and Carotid Duplex BL(Personally reviewed): Normal intracranial MRA. No large vessel occlusion or other vascular abnormality. No hemodynamically significant or correctable stenosis.  MRI Brain(Personally reviewed): Punctate 3 mm focus of restricted diffusion and mesial right temporal lobe/hippocampal formation  Continuous EEG:  Pending  Assessment  [name] is a 78 y.o. female with history of anxiety, hypertension, hyperlipidemia and recent prior suspected seizure provoked in the setting of THC gummy use who presents again with altered mental status.  She initially reported that she has been having speech difficulties as well as balance difficulties for the past few days.  She was admitted in September with altered mental status in the setting of having taken a THC gummy to help with insomnia.  Patient reports that she has not used any more of these Gummies. [name] states that she was in her usual state of health until Friday, when she had several falls after suddenly losing her balance.  [name] states that she fell several times on Friday, Saturday and Sunday and began having difficulties with speech and confusion during that time.  He reports that she did take a 10 mg THC gummy to  help with sleep about a week ago. Will check UDS.  - [name], patient is able to state her name, states she is in [facility] hospital but in the wrong city is able to state the month.  She did have an episode of agitation overnight and was treated with haloperidol . - LTM EEG report for this morning: This study is within normal limits. No seizures or epileptiform discharges were seen throughout the recording. Event button was pressed on 12/18/2023 at 1026 without concomitant EEG change.  This was most likely not an epileptic event.  - She was found to have a B12 deficiency on previous admission, and cyanocobalamin  prescription was not filled.  B12 this admission is 235.  Will supplement with IM cyanocobalamin .   - Ammonia is elevated at 36 (last admission it was 38); do not suspect that this mild elevation is the source of her confusion. Other AMS labs have been normal so far. Beta-hydroxybutyrate is elevated, but unsure of the significance of this as she is not hyperglycemic.   - Patient does have a punctate right hippocampal DWI abnormality on MRI, but this does not explain her altered mental status and aphasia.  - Impression: Primary component of the DDx is subclinical seizure activity as the etiology for her altered mental status and aphasia. Given husband's endorsement of her having taken a THC gummy again, adverse effects of THC are also possible.   Recommendations  - Continue atorvastatin  40 mg daily - Aspirin 81 mg daily antiplt/anticoag - Tele - PT/OT/SLP - Continue LTM EEG - Seizure precautions - Continue Vimpat  200 mg twice daily (increased from her home dose of 50 mg BID) - Cyanocobalamin  injection 1000 mcg today x 1 then weekly x 4 weeks, followed by daily oral supplementation   ______________________________________________________________________ Patient seen by NP and then by MD.  Signed, Cortney E Everitt Clint Kill, NP [facility]   Electronically signed: Dr. Camellia Shark

## 2023-12-18 NOTE — Evaluation (Addendum)
 Physical Therapy Evaluation Patient Details Name: [name] MRN: [id number] DOB: October 11, 1945 Today's Date: 12/18/2023  History of Present Illness  Pt is a 78 y.o. F who presents 12/16/2023 with multiple falls from feeling dizzy, slurred speech, and SOB. Hit head during fall on 10/31. MR showed punctate 3 mm focus of restricted diffusion involving the mesial right  temporal lobe/hippocampal formation. No associated hemorrhage. Primary  differential considerations include a punctate acute ischemic nonhemorrhagic  infarct versus changes of transient global amnesia. Performing EEG to rule out seizure. Was recently admitted 10/2023 for seizure-like activity. MRI with no evidence of acute intracranial abnormality. PMH positive for HTN, GERD, MDD, GAD, insomina, CVA.  Clinical Impression  PTA, pt was independent with all ADLs, ambulating with no AD, and lives with husband in single story home.Today, pt is pleasantly confused and not fully oriented to person, place, or situation. Gave maiden name and sister's name when asked for last name, and stated she was in [place], [place]  when asked for place, where they had lived for a while according to son. Not fully oriented to situation, but did ask later in session if she had had another seizure. Throughout session, noted expressive communication deficits and reduced clarity of speech. Pt minA for supine<>sit, with most aid needed for trunk mobility during supine>sit. Able to perform STS with minA +2 for safety and small lateral steps to sit closer to head of bed. Noted abnormal saccades and smooth pursuit, with pt noting only slight dizziness during testing. Evaluation of further mobility limited d/t presence of EEG. Recommending post-acute rehab >3hrs/day for cognitive remediation and functional mobility training.         If plan is discharge home, recommend the following: A lot of help with walking and/or transfers;A lot of help with  bathing/dressing/bathroom;Assistance with cooking/housework;Assistance with feeding;Direct supervision/assist for medications management;Direct supervision/assist for financial management;Assist for transportation;Help with stairs or ramp for entrance;Supervision due to cognitive status   Can travel by private vehicle        Equipment Recommendations Other (comment) (TBA)  Recommendations for Other Services       Functional Status Assessment Patient has had a recent decline in their functional status and demonstrates the ability to make significant improvements in function in a reasonable and predictable amount of time.     Precautions / Restrictions Precautions Precautions: Fall Recall of Precautions/Restrictions: Impaired Restrictions Weight Bearing Restrictions Per Provider Order: No      Mobility  Bed Mobility Overal bed mobility: Needs Assistance Bed Mobility: Supine to Sit, Sit to Supine     Supine to sit: Min assist Sit to supine: Min assist   General bed mobility comments: MinA for trunk mobility during supine>sit and navigating LE off EOB. Only slight minA needed for sit>supine for LE navigation.    Transfers Overall transfer level: Needs assistance Equipment used: None Transfers: Sit to/from Stand Sit to Stand: Min assist, +2 physical assistance           General transfer comment: MinA +2 no AD for sit>stand and 2 lateral steps at EOB.    Ambulation/Gait                  Stairs            Wheelchair Mobility     Tilt Bed    Modified Rankin (Stroke Patients Only)       Balance  Pertinent Vitals/Pain Pain Assessment Pain Assessment: Faces Faces Pain Scale: Hurts little more Pain Location: back Pain Descriptors / Indicators: Sore Pain Intervention(s): Limited activity within patient's tolerance, Monitored during session, Other (comment) (Notified RN of c/o back  pain)    Home Living Family/patient expects to be discharged to:: Private residence Living Arrangements: Spouse/significant other Available Help at Discharge: Family;Available 24 hours/day Type of Home: House Home Access: Stairs to enter Entrance Stairs-Rails: Right Entrance Stairs-Number of Steps: 2   Home Layout: One level Home Equipment: [employer] (2 wheels);Cane - single point;Grab bars - tub/shower;Shower seat Additional Comments: Son present to provide hx.    Prior Function Prior Level of Function : Independent/Modified Independent;History of Falls (last six months) (No other falls except fall 10/31.)             Mobility Comments: No AD used at most recent baseline.  Independent with ADLs, financial management, medication management       Extremity/Trunk Assessment   Upper Extremity Assessment Upper Extremity Assessment: RUE deficits/detail;LUE deficits/detail RUE Deficits / Details: Grossly 4/5 (shoulder flexion, elbow flexion/extension) RUE Coordination: decreased gross motor LUE Deficits / Details: Grossly 4/5 (shoulder flexion, elbow flexion/extension) LUE Coordination: decreased gross motor    Lower Extremity Assessment Lower Extremity Assessment: RLE deficits/detail;LLE deficits/detail RLE Deficits / Details: Grossly 4/5 (hip flexion, knee flexion/extension, ankle PF/DF) LLE Deficits / Details: Grossly 4/5 (hip flexion, knee flexion/extension, ankle PF/DF)    Cervical / Trunk Assessment Cervical / Trunk Assessment: Kyphotic  Communication   Communication Communication: Impaired Factors Affecting Communication: Reduced clarity of speech;Difficulty expressing self    Cognition Arousal: Alert Behavior During Therapy: Flat affect, WFL for tasks assessed/performed   PT - Cognitive impairments: Orientation, Awareness, Memory, Attention, Problem solving, Safety/Judgement   Orientation impairments: Person, Place, Situation, Time                    PT - Cognition Comments: Often gave incorrect answers, but occasionally corrected self. Stated sister's last name as her own, that she was in [place] , and had to be further prompted to get correct month of the year. Pt was oriented to the year. Somewhat aware of situation, and asked if she had a seizure. Following commands: Impaired Following commands impaired: Follows one step commands inconsistently, Follows one step commands with increased time, Follows multi-step commands inconsistently     Cueing Cueing Techniques: Verbal cues, Gestural cues     General Comments General comments (skin integrity, edema, etc.): Noted fully body slight shaking during session today. Denied feeling cold, and son stated she does not have a tremor at baseline. Abnormal smooth pursuit and saccades bilaterally.    Exercises     Assessment/Plan    PT Assessment Patient needs continued PT services  PT Problem List Decreased strength;Decreased activity tolerance;Decreased balance;Decreased mobility;Decreased coordination;Decreased safety awareness       PT Treatment Interventions DME instruction;Gait training;Stair training;Functional mobility training;Therapeutic activities;Therapeutic exercise;Balance training;Neuromuscular re-education;Cognitive remediation;Patient/family education;Manual techniques;Modalities    PT Goals (Current goals can be found in the Care Plan section)  Acute Rehab PT Goals Patient Stated Goal: Return home PT Goal Formulation: With patient/family Time For Goal Achievement: 01/01/24 Potential to Achieve Goals: Good    Frequency Min 3X/week     Co-evaluation               AM-PAC PT 6 Clicks Mobility  Outcome Measure Help needed turning from your back to your side while in a flat bed without using bedrails?: A  Little Help needed moving from lying on your back to sitting on the side of a flat bed without using bedrails?: A Little Help needed moving to  and from a bed to a chair (including a wheelchair)?: A Little Help needed standing up from a chair using your arms (e.g., wheelchair or bedside chair)?: A Little Help needed to walk in hospital room?: A Little Help needed climbing 3-5 steps with a railing? : A Lot 6 Click Score: 17    End of Session Equipment Utilized During Treatment: Gait belt Activity Tolerance: Patient tolerated treatment well Patient left: in bed;with call bell/phone within reach;with family/visitor present;with bed alarm set Nurse Communication: Mobility status;Patient requests pain meds PT Visit Diagnosis: Unsteadiness on feet (R26.81);History of falling (Z91.81);Muscle weakness (generalized) (M62.81)    Time: 9746-9680 PT Time Calculation (min) (ACUTE ONLY): 26 min   Charges:   PT Evaluation $PT Eval Moderate Complexity: 1 Mod PT Treatments $Therapeutic Activity: 8-22 mins PT General Charges $$ ACUTE PT VISIT: 1 Visit         Kayanna Mckillop, SPT   Breda Bond 12/18/2023, 3:57 PM

## 2023-12-18 NOTE — Progress Notes (Signed)
 PROGRESS NOTE    [name]  FMW:[id number] DOB: 05-06-1945 DOA: 12/16/2023  PCP: Wendolyn Jenkins Jansky, MD   Brief Narrative:  This 78 yrs old female with PMH significant for hypertension, hyperlipidemia, anxiety, previous history of stroke presented in the ED with altered mental status.  Patient reports that she has been having difficulty with her speech over the last couple of days as well as difficulty with her balance.  On her prior hospitalization she was monitored on EEG which did not demonstrate any seizure activity.  Patient was started on Vimpat  50 mg twice daily, it is uncertain if she is taking medicine.  LP was attempted but was unsuccessful even with fluoroscopy guidance.  Encephalopathy has  improved and She was discharged home. Workup in the ED reveals CT head negative for acute intracranial abnormality.  Patient was admitted for further evaluation.  Neurology is consulted.  Assessment & Plan:   Principal Problem:   Dizziness Active Problems:   Essential hypertension  Dizziness: Patient presented with dizziness and subsequent fall likely due to dehydration and hypovolemia. Continue IV fluid resuscitation, Continue fall precautions. PT/ OT Evaluation.  Dysarthria: MRI concerning for stroke. Neurology consulted recommended to continue with the stroke workup. Obtain 2D echocardiogram, PT and OT evaluation. Continue EEG monitoring. MRA > No large vessel occlusion or other vascular abnormality. Vit B12 235, Ammonia level 36, hemoglobin A1c 5.7. May consider repeating LP if no improvement.   Anion gap metabolic acidosis: Likely due to lactic acidosis which is resolved with IV hydration.  Chronic diarrhea: Patient has a GI appointment that is upcoming. This could be Infectious  or GIB .   Obtain Stool studies. Occult blood.  Less likely inflammatory or microscopic colitis or malignancy. Follow up outpatient.   Hypokalemia: Replaced.  Continue to monitor    Acute kidney injury: Baseline serum creatinine normal.  Presented with creatinine of 2.04 Continue with IV hydration,  avoid nephrotoxic medications,  serum creatinine improving.  Generalized anxiety disorder / depression: Continue venlafaxine .   Seizure disorder: Continue Vimpat .   Continue seizure precautions,  aspiration and fall precautions.  Suspect pt had neuro event from hypoxia in September admission less likely seizure,    DVT prophylaxis: Heparin Code Status:Full code Family Communication: Family at bed side. Disposition Plan:   Status is: Inpatient Remains inpatient appropriate because:      Admitted for acute encephalopathy likely multifactorial.  Workup is in process , Neurology is consulted.  Patient is connected with long-term EEG.  Consultants:  Neurology  Procedures: CT Head.  Antimicrobials:  Anti-infectives (From admission, onward)    None      Subjective: Patient was seen and examined at bedside.  Overnight events noted. Patient reports she is feeling better but appears slightly confused,  family at bedside. Patient was connected with long-term EEG.  Objective: Vitals:   12/18/23 0400 12/18/23 0500 12/18/23 0826 12/18/23 1148  BP: (!) 144/60  (!) (P) 152/70 136/78  Pulse: 96  (P) 96 73  Resp: 20   18  Temp: 98 F (36.7 C)  (P) 97.7 F (36.5 C) 98.9 F (37.2 C)  TempSrc: Oral  (P) Oral Axillary  SpO2: 95%  (P) 97% 99%  Weight:  82.8 kg    Height:        Intake/Output Summary (Last 24 hours) at 12/18/2023 1310 Last data filed at 12/18/2023 0300 Gross per 24 hour  Intake 86.16 ml  Output --  Net 86.16 ml   [employer]  12/16/23 1038 12/18/23 0500  Weight: 81.6 kg 82.8 kg    Examination:  General exam: Appears calm and comfortable, not in any acute distress. Respiratory system: CTA Bilaterally.  Respiratory effort normal.  RR 14 Cardiovascular system: S1 & S2 heard, RRR. No JVD, murmurs, rubs, gallops or clicks. No pedal  edema. Gastrointestinal system: Abdomen is non distended, soft and non tender. Normal bowel sounds heard. Central nervous system: Alert and oriented x 3. No focal neurological deficits. Extremities: No edema, no cyanosis, no clubbing. Skin: No rashes, lesions or ulcers Psychiatry: Judgement and insight appear normal. Mood & affect appropriate.     Data Reviewed: I have personally reviewed following labs and imaging studies  CBC: Recent Labs  Lab 12/16/23 1043 12/16/23 1203 12/16/23 2112 12/18/23 0255  WBC 14.3*  --   --  10.4  NEUTROABS 12.6*  --   --   --   HGB 12.4 13.3 10.2* 10.8*  HCT 38.3 39.0 31.5* 33.5*  MCV 83.8  --   --  84.4  PLT 505*  --   --  454*   Basic Metabolic Panel: Recent Labs  Lab 12/16/23 1043 12/16/23 1203 12/16/23 1658 12/16/23 2027 12/17/23 0602 12/18/23 0255  NA 134* 133*  --  135 138 140  K 3.4* 3.2*  --  3.0* 3.4* 3.3*  CL 95* 101  --  100 102 105  CO2 15*  --   --  19* 22 20*  GLUCOSE 133* 128*  --  105* 121* 133*  BUN 22 23  --  24* 22 24*  CREATININE 2.04* 2.10*  --  1.75* 1.37* 1.15*  CALCIUM  9.3  --   --  8.4* 8.5* 8.6*  MG  --   --  2.1 1.9  --  2.0  PHOS  --   --   --   --   --  3.1   GFR: Estimated Creatinine Clearance: 39.6 mL/min (A) (by C-G formula based on SCr of 1.15 mg/dL (H)). Liver Function Tests: Recent Labs  Lab 12/16/23 1043  AST 33  ALT 25  ALKPHOS 103  BILITOT 1.1  PROT 7.5  ALBUMIN 4.1   No results for input(s): LIPASE, AMYLASE in the last 168 hours. Recent Labs  Lab 12/17/23 0602  AMMONIA 36*   Coagulation Profile: Recent Labs  Lab 12/16/23 1043  INR 1.1   Cardiac Enzymes: Recent Labs  Lab 12/16/23 1658  CKTOTAL 230   BNP (last 3 results) No results for input(s): PROBNP in the last 8760 hours. HbA1C: Recent Labs    12/16/23 1658 12/17/23 0602  HGBA1C 5.7* 5.7*   CBG: Recent Labs  Lab 12/16/23 1034 12/17/23 0644 12/18/23 0645 12/18/23 1147  GLUCAP 141* 115* 90 114*    Lipid Profile: Recent Labs    12/17/23 0602  CHOL 134  HDL 57  LDLCALC 50  TRIG 135  CHOLHDL 2.4   Thyroid  Function Tests: Recent Labs    12/16/23 1658  TSH 1.444  FREET4 0.98   Anemia Panel: Recent Labs    12/17/23 0602  VITAMINB12 235  FOLATE >20.0   Sepsis Labs: Recent Labs  Lab 12/16/23 1434 12/16/23 1700 12/16/23 2032 12/16/23 2225  LATICACIDVEN 2.0* 3.1* 1.2 1.0    Recent Results (from the past 240 hours)  Blood culture (routine x 2)     Status: Abnormal (Preliminary result)   Collection Time: 12/16/23  8:25 PM   Specimen: BLOOD  Result Value Ref Range Status   Specimen Description BLOOD  LEFT ANTECUBITAL  Final   Special Requests   Final    BOTTLES DRAWN AEROBIC AND ANAEROBIC Blood Culture results may not be optimal due to an inadequate volume of blood received in culture bottles   Culture  Setup Time   Final    GRAM POSITIVE COCCI IN BOTH AEROBIC AND ANAEROBIC BOTTLES CRITICAL RESULT CALLED TO, READ BACK BY AND VERIFIED WITH: PHARMD C. AMEND [id number] AT 1650, ADC Performed at [facility], [address] [place], [place] [address]    Culture STAPHYLOCOCCUS EPIDERMIDIS (A)  Final   Report Status PENDING  Incomplete  Blood Culture ID Panel (Reflexed)     Status: Abnormal   Collection Time: 12/16/23  8:25 PM  Result Value Ref Range Status   Enterococcus faecalis NOT DETECTED NOT DETECTED Final   Enterococcus Faecium NOT DETECTED NOT DETECTED Final   Listeria monocytogenes NOT DETECTED NOT DETECTED Final   Staphylococcus species DETECTED (A) NOT DETECTED Final    Comment: CRITICAL RESULT CALLED TO, READ BACK BY AND VERIFIED WITH: PHARMD C. AMEND [id number] AT 1650, ADC    Staphylococcus aureus (BCID) NOT DETECTED NOT DETECTED Final   Staphylococcus epidermidis DETECTED (A) NOT DETECTED Final    Comment: Methicillin (oxacillin) resistant coagulase negative staphylococcus. Possible blood culture contaminant (unless isolated from more than one blood  culture draw or clinical case suggests pathogenicity). No antibiotic treatment is indicated for blood  culture contaminants. CRITICAL RESULT CALLED TO, READ BACK BY AND VERIFIED WITH: PHARMD C. AMEND [id number] AT 1650, ADC    Staphylococcus lugdunensis NOT DETECTED NOT DETECTED Final   Streptococcus species NOT DETECTED NOT DETECTED Final   Streptococcus agalactiae NOT DETECTED NOT DETECTED Final   Streptococcus pneumoniae NOT DETECTED NOT DETECTED Final   Streptococcus pyogenes NOT DETECTED NOT DETECTED Final   A.calcoaceticus-baumannii NOT DETECTED NOT DETECTED Final   Bacteroides fragilis NOT DETECTED NOT DETECTED Final   Enterobacterales NOT DETECTED NOT DETECTED Final   Enterobacter cloacae complex NOT DETECTED NOT DETECTED Final   Escherichia coli NOT DETECTED NOT DETECTED Final   Klebsiella aerogenes NOT DETECTED NOT DETECTED Final   Klebsiella oxytoca NOT DETECTED NOT DETECTED Final   Klebsiella pneumoniae NOT DETECTED NOT DETECTED Final   Proteus species NOT DETECTED NOT DETECTED Final   Salmonella species NOT DETECTED NOT DETECTED Final   Serratia marcescens NOT DETECTED NOT DETECTED Final   Haemophilus influenzae NOT DETECTED NOT DETECTED Final   Neisseria meningitidis NOT DETECTED NOT DETECTED Final   Pseudomonas aeruginosa NOT DETECTED NOT DETECTED Final   Stenotrophomonas maltophilia NOT DETECTED NOT DETECTED Final   Candida albicans NOT DETECTED NOT DETECTED Final   Candida auris NOT DETECTED NOT DETECTED Final   Candida glabrata NOT DETECTED NOT DETECTED Final   Candida krusei NOT DETECTED NOT DETECTED Final   Candida parapsilosis NOT DETECTED NOT DETECTED Final   Candida tropicalis NOT DETECTED NOT DETECTED Final   Cryptococcus neoformans/gattii NOT DETECTED NOT DETECTED Final   Methicillin resistance mecA/C DETECTED (A) NOT DETECTED Final    Comment: CRITICAL RESULT CALLED TO, READ BACK BY AND VERIFIED WITH: PHARMD C. AMEND [id number] AT 1650, ADC Performed at [facility], [address], [place], [place] [address]   Blood culture (routine x 2)     Status: None (Preliminary result)   Collection Time: 12/16/23  8:30 PM   Specimen: BLOOD LEFT FOREARM  Result Value Ref Range Status   Specimen Description BLOOD LEFT FOREARM  Final   Special Requests  Final    BOTTLES DRAWN AEROBIC AND ANAEROBIC Blood Culture results may not be optimal due to an inadequate volume of blood received in culture bottles   Culture  Setup Time   Final    GRAM POSITIVE COCCI IN CLUSTERS AEROBIC BOTTLE ONLY CRITICAL VALUE NOTED.  VALUE IS CONSISTENT WITH PREVIOUSLY REPORTED AND CALLED VALUE. Performed at [facility], [address] [place], [place] [address]    Culture [facility] POSITIVE COCCI  Final   Report Status PENDING  Incomplete    Radiology Studies: VAS US  CAROTID Result Date: 12/18/2023 Carotid Arterial Duplex Study Patient Name:  VERNETTE MOISE [facility]  Date of Exam:   12/17/2023 Medical Rec #: [id number]             Accession #:    [id number] Date of Birth: March 07, 1945             Patient Gender: F Patient Age:   60 years Exam Location:  [facility] Procedure:      VAS US  CAROTID Referring Phys: EARLE DE LA TORRE --------------------------------------------------------------------------------  Indications:       CVA. Risk Factors:      Hypertension, hyperlipidemia. Comparison Study:  No prior studies. Performing Technologist: Cordella Collet RVT  Examination Guidelines: A complete evaluation includes B-mode imaging, spectral Doppler, color Doppler, and power Doppler as needed of all accessible portions of each vessel. Bilateral testing is considered an integral part of a complete examination. Limited examinations for reoccurring indications may be performed as noted.  Right Carotid Findings: +----------+--------+--------+--------+-----------------------+--------+           PSV cm/sEDV cm/sStenosisPlaque Description     Comments  +----------+--------+--------+--------+-----------------------+--------+ CCA Prox  155     15                                              +----------+--------+--------+--------+-----------------------+--------+ CCA Distal131     19              smooth and heterogenous         +----------+--------+--------+--------+-----------------------+--------+ ICA Prox  118     32              smooth and heterogenous         +----------+--------+--------+--------+-----------------------+--------+ ICA Mid   125     35                                              +----------+--------+--------+--------+-----------------------+--------+ ICA Distal160     39                                              +----------+--------+--------+--------+-----------------------+--------+ ECA       189     10                                              +----------+--------+--------+--------+-----------------------+--------+ +----------+--------+-------+--------+-------------------+           PSV cm/sEDV cmsDescribeArm Pressure (mmHG) +----------+--------+-------+--------+-------------------+ Subclavian221                                        +----------+--------+-------+--------+-------------------+ +---------+--------+--+--------+--+---------+  VertebralPSV cm/s73EDV cm/s14Antegrade +---------+--------+--+--------+--+---------+  Left Carotid Findings: +----------+--------+--------+--------+-----------------------+--------+           PSV cm/sEDV cm/sStenosisPlaque Description     Comments +----------+--------+--------+--------+-----------------------+--------+ CCA Prox  161     19              smooth and heterogenous         +----------+--------+--------+--------+-----------------------+--------+ CCA Distal105     19              smooth and heterogenous         +----------+--------+--------+--------+-----------------------+--------+ ICA Prox  87      21               smooth and heterogenous         +----------+--------+--------+--------+-----------------------+--------+ ICA Mid   165     31                                     tortuous +----------+--------+--------+--------+-----------------------+--------+ ICA Distal138     27                                              +----------+--------+--------+--------+-----------------------+--------+ ECA       117     1                                               +----------+--------+--------+--------+-----------------------+--------+ +----------+--------+--------+--------+-------------------+           PSV cm/sEDV cm/sDescribeArm Pressure (mmHG) +----------+--------+--------+--------+-------------------+ [id number]                                         +----------+--------+--------+--------+-------------------+ +---------+--------+--+--------+--+---------+ VertebralPSV cm/s65EDV cm/s16Antegrade +---------+--------+--+--------+--+---------+   Summary: Right Carotid: Velocities in the right ICA are consistent with a 1-39% stenosis. Left Carotid: Velocities in the left ICA are consistent with a 1-39% stenosis. Vertebrals: Bilateral vertebral arteries demonstrate antegrade flow. *See table(s) above for measurements and observations.  Electronically signed by Eather Popp MD on 12/18/2023 at 11:32:29 AM.    Final    Overnight EEG with video Result Date: 12/18/2023 Shelton Arlin KIDD, MD     12/18/2023 10:27 AM Patient Name: Amzie Sillas MRN: [id number] Epilepsy Attending: Arlin KIDD Shelton Referring Physician/Provider: Michaela Aisha SQUIBB, MD Duration: 12/17/2023 0951 to 12/18/2023 1030  Patient history: 78 y.o. female past history of anxiety, depression, hypertension, hyperlipidemia, insomnia who presented for evaluation of altered mental status after ingestion of THC/delta 8 gummy yesterday, then had a witnessed tonic-clonic seizure lasting about 1 minute in the ED. EEG to  evaluate for seizure  Level of alertness: Awake, asleep  AEDs during EEG study: None  Technical aspects: This EEG study was done with scalp electrodes positioned according to the 10-20 International system of electrode placement. Electrical activity was reviewed with band pass filter of 1-70Hz , sensitivity of 7 uV/mm, display speed of 71mm/sec with a 60Hz  notched filter applied as appropriate. EEG data were recorded continuously and digitally stored.  Video monitoring was available and reviewed as appropriate.  Description: The posterior dominant rhythm consists of 8-9 Hz activity of moderate voltage (25-35 uV)  seen predominantly in posterior head regions, symmetric and reactive to eye opening and eye closing.  Sleep was characterized by vertex waves, sleep spindles (12 to 14 Hz), maximal frontocentral region.  Hyperventilation and photic stimulation were not performed.   Event button was pressed on 12/18/2023 at 1026.  Concomitant EEG before, during and after the event did not show any EEG to suggest seizure. EEG was disconnected between 12/17/2023 2020 to 12/18/2023 0754 due to technical difficulties  IMPRESSION: This study is within normal limits. No seizures or epileptiform discharges were seen throughout the recording. Event button was pressed on 12/18/2023 at 1026 without concomitant EEG change.  This was most likely not an epileptic event.  A normal interictal EEG does not exclude the diagnosis of epilepsy.   Priyanka O Yadav   VAS US  LOWER EXTREMITY VENOUS (DVT) Result Date: 12/17/2023  Lower Venous DVT Study Patient Name:  KANON COLUNGA [facility]  Date of Exam:   12/17/2023 Medical Rec #: [id number]             Accession #:    [id number] Date of Birth: 04/10/1945             Patient Gender: F Patient Age:   59 years Exam Location:  [facility] Procedure:      VAS US  LOWER EXTREMITY VENOUS (DVT) Referring Phys: EKTA PATEL --------------------------------------------------------------------------------   Indications: Positive D dimer [320372].  Risk Factors: None identified. Limitations: Poor ultrasound/tissue interface and body habitus. Comparison Study: No prior studies. Performing Technologist: Cordella Collet RVT  Examination Guidelines: A complete evaluation includes B-mode imaging, spectral Doppler, color Doppler, and power Doppler as needed of all accessible portions of each vessel. Bilateral testing is considered an integral part of a complete examination. Limited examinations for reoccurring indications may be performed as noted. The reflux portion of the exam is performed with the patient in reverse Trendelenburg.  +---------+---------------+---------+-----------+----------+-------------------+ RIGHT    CompressibilityPhasicitySpontaneityPropertiesThrombus Aging      +---------+---------------+---------+-----------+----------+-------------------+ CFV      Full           Yes      Yes                                      +---------+---------------+---------+-----------+----------+-------------------+ SFJ      Full                                                             +---------+---------------+---------+-----------+----------+-------------------+ FV Prox  Full                                                             +---------+---------------+---------+-----------+----------+-------------------+ FV Mid   Full                                                             +---------+---------------+---------+-----------+----------+-------------------+ FV DistalFull                                                             +---------+---------------+---------+-----------+----------+-------------------+  PFV      Full                                                             +---------+---------------+---------+-----------+----------+-------------------+ POP      Full           Yes      Yes                                       +---------+---------------+---------+-----------+----------+-------------------+ PTV      Full                                                             +---------+---------------+---------+-----------+----------+-------------------+ PERO                                                  Not well visualized +---------+---------------+---------+-----------+----------+-------------------+   +---------+---------------+---------+-----------+----------+-------------------+ LEFT     CompressibilityPhasicitySpontaneityPropertiesThrombus Aging      +---------+---------------+---------+-----------+----------+-------------------+ CFV      Full           Yes      Yes                                      +---------+---------------+---------+-----------+----------+-------------------+ SFJ      Full                                                             +---------+---------------+---------+-----------+----------+-------------------+ FV Prox  Full                                                             +---------+---------------+---------+-----------+----------+-------------------+ FV Mid   Full                                                             +---------+---------------+---------+-----------+----------+-------------------+ FV DistalFull                                                             +---------+---------------+---------+-----------+----------+-------------------+ PFV      Full                                                             +---------+---------------+---------+-----------+----------+-------------------+  POP      Full           Yes      Yes                                      +---------+---------------+---------+-----------+----------+-------------------+ PTV      Full                                                             +---------+---------------+---------+-----------+----------+-------------------+  PERO                                                  Not well visualized +---------+---------------+---------+-----------+----------+-------------------+     Summary: RIGHT: - There is no evidence of deep vein thrombosis in the lower extremity. However, portions of this examination were limited- see technologist comments above.  - No cystic structure found in the popliteal fossa.  LEFT: - There is no evidence of deep vein thrombosis in the lower extremity. However, portions of this examination were limited- see technologist comments above.  - No cystic structure found in the popliteal fossa.  *See table(s) above for measurements and observations. Electronically signed by Lonni Gaskins MD on 12/17/2023 at 1:11:50 PM.    Final    ECHOCARDIOGRAM COMPLETE Result Date: 12/17/2023    ECHOCARDIOGRAM REPORT   Patient Name:   PAYETON GERMANI [facility] Date of Exam: 12/17/2023 Medical Rec #:  [id number]            Height:       62.0 in Accession #:    [id number]           Weight:       180.0 lb Date of Birth:  [birth year]            BSA:          1.828 m Patient Age:    78 years             BP:           114/49 mmHg Patient Gender: F                    HR:           74 bpm. Exam Location:  Inpatient Procedure: 2D Echo (Both Spectral and Color Flow Doppler were utilized during            procedure). Indications:    TIA/dizziness  History:        Patient has prior history of Echocardiogram examinations. Risk                 Factors:Hypertension.  Sonographer:    Charmaine Gaskins Referring Phys: [id number] EKTA V PATEL  Sonographer Comments: Technically challenging study due to limited acoustic windows. Image acquisition challenging due to uncooperative patient. IMPRESSIONS  1. Left ventricular ejection fraction, by estimation, is 65 to 70%. The left ventricle has normal function. The left ventricle has no regional wall motion abnormalities. Left ventricular diastolic parameters are consistent with Grade I diastolic dysfunction  (impaired relaxation).  2. Right ventricular systolic  function is normal. The right ventricular size is normal.  3. The mitral valve is normal in structure. No evidence of mitral valve regurgitation. No evidence of mitral stenosis.  4. The aortic valve is normal in structure. There is moderate calcification of the aortic valve. Aortic valve regurgitation is not visualized. No aortic stenosis is present.  5. The inferior vena cava is normal in size with greater than 50% respiratory variability, suggesting right atrial pressure of 3 mmHg. FINDINGS  Left Ventricle: Left ventricular ejection fraction, by estimation, is 65 to 70%. The left ventricle has normal function. The left ventricle has no regional wall motion abnormalities. The left ventricular internal cavity size was normal in size. There is  no left ventricular hypertrophy. Left ventricular diastolic parameters are consistent with Grade I diastolic dysfunction (impaired relaxation). Right Ventricle: The right ventricular size is normal. No increase in right ventricular wall thickness. Right ventricular systolic function is normal. Left [facility]: Left atrial size was normal in size. Right [facility]: Right atrial size was normal in size. Pericardium: There is no evidence of pericardial effusion. Mitral Valve: The mitral valve is normal in structure. No evidence of mitral valve regurgitation. No evidence of mitral valve stenosis. Tricuspid Valve: The tricuspid valve is normal in structure. Tricuspid valve regurgitation is trivial. No evidence of tricuspid stenosis. Aortic Valve: The aortic valve is normal in structure. There is moderate calcification of the aortic valve. Aortic valve regurgitation is not visualized. No aortic stenosis is present. Pulmonic Valve: The pulmonic valve was not well visualized. Pulmonic valve regurgitation is not visualized. No evidence of pulmonic stenosis. Aorta: The aortic root is normal in size and structure. Venous: The inferior vena  cava is normal in size with greater than 50% respiratory variability, suggesting right atrial pressure of 3 mmHg. IAS/Shunts: No atrial level shunt detected by color flow Doppler.  LEFT VENTRICLE PLAX 2D LVIDd:         4.70 cm   Diastology LVIDs:         2.10 cm   LV e' medial:    7.51 cm/s LV PW:         1.00 cm   LV E/e' medial:  12.3 LV IVS:        0.80 cm   LV e' lateral:   6.42 cm/s LVOT diam:     1.90 cm   LV E/e' lateral: 14.4 LVOT Area:     2.84 cm  RIGHT VENTRICLE RV Basal diam:  3.30 cm RV Mid diam:    2.90 cm RV S prime:     13.20 cm/s LEFT ATRIUM             Index        RIGHT ATRIUM           Index LA diam:        3.10 cm 1.70 cm/m   RA Area:     11.90 cm LA Vol (A2C):   37.1 ml 20.30 ml/m  RA Volume:   27.30 ml  14.93 ml/m LA Vol (A4C):   47.6 ml 26.04 ml/m LA Biplane Vol: 45.5 ml 24.89 ml/m   AORTA Ao Root diam: 2.40 cm Ao Asc diam:  2.60 cm MITRAL VALVE MV Area (PHT): 4.36 cm     SHUNTS MV Decel Time: 174 msec     Systemic Diam: 1.90 cm MV E velocity: 92.50 cm/s MV A velocity: 123.00 cm/s MV E/A ratio:  0.75 Franck Azobou R.r. Donnelley Electronically signed by Joelle Cedars Tonleu  Signature Date/Time: 12/17/2023/10:23:29 AM    Final    MR ANGIO HEAD WO CONTRAST Result Date: 12/17/2023 CLINICAL DATA:  Initial evaluation for acute neuro deficit, stroke suspected. EXAM: MRA HEAD WITHOUT CONTRAST TECHNIQUE: Angiographic images of the Circle of Willis were acquired using MRA technique without intravenous contrast. COMPARISON:  Comparison made with prior brain MRI from 12/16/2023. FINDINGS: Anterior circulation: Both internal carotid arteries are widely patent through the siphons without stenosis or other abnormality. Small funnel shaped outpouching extending laterally from the cavernous right ICA noted, consistent with a small vascular infundibulum (series 9, image 61). A1 segments patent bilaterally. Normal anterior communicating artery complex. Anterior cerebral arteries patent without stenosis. No  M1 stenosis or occlusion. No proximal MCA branch occlusion or high-grade stenosis. Distal MCA branches perfused and symmetric. Posterior circulation: Visualized distal right V4 segment is patent without stenosis. Right vertebral artery dominant. Partially visualized distal left V4 segment is markedly hypoplastic but grossly patent as well. Left PICA patent at its takeoff. Right PICA origin not seen. Basilar patent without stenosis. Superior cerebral arteries patent bilaterally. Both PCA supplied via hypoplastic P1 segments and robust bilateral posterior communicating arteries. Both PCAs patent to their distal aspects without stenosis. Anatomic variants: As above. Other: No intracranial aneurysm. IMPRESSION: Normal intracranial MRA. No large vessel occlusion or other vascular abnormality. No hemodynamically significant or correctable stenosis. Electronically Signed   By: Morene Hoard M.D.   On: 12/17/2023 02:36   MR BRAIN WO CONTRAST Result Date: 12/16/2023 EXAM: MRI BRAIN WITHOUT CONTRAST 12/16/2023 05:50:56 PM TECHNIQUE: Multiplanar multisequence MRI of the head/brain was performed without the administration of intravenous contrast. COMPARISON: Compared to CT from earlier the same day. CLINICAL HISTORY: FINDINGS: BRAIN AND VENTRICLES: Punctate 3 mm focus of restricted diffusion seen involving the mesial right temporal lobe/hippocampal formation (series 5, 67). No associated hemorrhage. No mass. No midline shift. No hydrocephalus. The sella is unremarkable. Normal flow voids. ORBITS: Prior bilateral ocular lens replacement. SINUSES AND MASTOIDS: No acute abnormality. BONES AND SOFT TISSUES: Normal marrow signal. No acute soft tissue abnormality. IMPRESSION: 1. Punctate 3 mm focus of restricted diffusion involving the mesial right temporal lobe/hippocampal formation. No associated hemorrhage. Primary differential considerations include a punctate acute ischemic nonhemorrhagic infarct versus changes of  transient global amnesia. 2. Otherwise essentially normal brain MRI for age. Electronically signed by: Morene Hoard MD 12/16/2023 06:35 PM EST RP Workstation: [id number]   DG Hip Unilat W or Wo Pelvis 2-3 Views Right Result Date: 12/16/2023 EXAM: 2 or 3 VIEW(S) XRAY OF THE RIGHT HIP 12/16/2023 01:31:00 PM COMPARISON: CT abdomen and pelvis 02/05/2022. CLINICAL HISTORY: pain bruising after fall FINDINGS: BONES AND JOINTS: No acute fracture or focal osseous lesion is identified in the right hip. The right hip joint is maintained. No significant degenerative changes are seen in the right hip. There is a small sclerotic lesion in the left femoral head, most likely a bone island. SOFT TISSUES: The soft tissues are unremarkable. IMPRESSION: 1. No acute fracture or dislocation. Electronically signed by: Greig Pique MD 12/16/2023 01:45 PM EST RP Workstation: [id number]   DG Knee Complete 4 Views Right Result Date: 12/16/2023 EXAM: 4 VIEW(S) XRAY OF THE RIGHT KNEE 12/16/2023 01:31:00 PM COMPARISON: None available. CLINICAL HISTORY: pain bruising after fall FINDINGS: BONES AND JOINTS: Right knee total arthroplasty is in anatomic alignment. No evidence for hardware loosening. No acute fracture. No focal osseous lesion. No joint dislocation. No significant joint effusion. SOFT TISSUES: The soft tissues are unremarkable. IMPRESSION: 1. No acute findings.  2. Right knee total arthroplasty in anatomic alignment without evidence of hardware loosening. Electronically signed by: Greig Pique MD 12/16/2023 01:44 PM EST RP Workstation: [id number]   DG Ribs Unilateral Left Result Date: 12/16/2023 EXAM: 2 VIEW(S) XRAY OF THE LEFT RIBS 12/16/2023 01:31:00 PM COMPARISON: None available. CLINICAL HISTORY: 855384 Pain 144615; 190176 Fall 190176 Pain 144615; 190176 Fall 190176 FINDINGS: BONES: No acute displaced rib fracture. LUNGS AND PLEURA: Visualized lungs demonstrate no acute abnormality. IMPRESSION: 1. No acute displaced  rib fracture. Electronically signed by: Greig Pique MD 12/16/2023 01:42 PM EST RP Workstation: [id number]   DG Shoulder Left Result Date: 12/16/2023 EXAM: 1 VIEW XRAY OF THE LEFT SHOULDER 12/16/2023 01:31:00 PM COMPARISON: None available. CLINICAL HISTORY: Pain bruising after fall. FINDINGS: BONES AND JOINTS: Glenohumeral joint is normally aligned. No acute fracture or dislocation. The [facility] joint is unremarkable in appearance. SOFT TISSUES: No abnormal calcifications. Visualized lung is unremarkable. IMPRESSION: 1. No significant abnormality. Electronically signed by: Greig Pique MD 12/16/2023 01:41 PM EST RP Workstation: [id number]   DG Chest 2 View Result Date: 12/16/2023 EXAM: 2 VIEW(S) XRAY OF THE CHEST 12/16/2023 01:31:00 PM COMPARISON: Chest X-ray 10/17/2023. CLINICAL HISTORY: SOB SOB FINDINGS: LUNGS AND PLEURA: No focal pulmonary opacity. No pulmonary edema. No pleural effusion. No pneumothorax. HEART AND MEDIASTINUM: No acute abnormality of the cardiac and mediastinal silhouettes. BONES AND SOFT TISSUES: No acute osseous abnormality. IMPRESSION: 1. No acute process. Electronically signed by: Greig Pique MD 12/16/2023 01:40 PM EST RP Workstation: [id number]   Scheduled Meds:  aspirin EC  81 mg Oral Daily   atorvastatin   40 mg Oral Daily   heparin  5,000 Units Subcutaneous Q8H   pantoprazole  (PROTONIX ) IV  40 mg Intravenous Q12H   potassium chloride   40 mEq Oral Once   raloxifene   60 mg Oral Daily   sodium chloride  flush  3 mL Intravenous Q12H   venlafaxine  XR  150 mg Oral Q breakfast   Continuous Infusions:  lacosamide  (VIMPAT ) IV 200 mg (12/18/23 1238)     LOS: 1 day    Time spent: 35 mins    Darcel Dawley, MD [facility]   If 7PM-7AM, please contact night-coverage

## 2023-12-18 NOTE — Progress Notes (Signed)
 Moved patient to 3W08  Moved all local files to server.  All impedances below 10k.  No skin breakdown noted at  FP1  FP2  F3 FZ F4.  [facility] called and is now monitoring the patient.

## 2023-12-18 NOTE — Procedures (Addendum)
 Patient Name: Shynice Sigel  MRN: [id number]  Epilepsy Attending: Arlin MALVA Krebs  Referring Physician/Provider: Michaela Aisha SQUIBB, MD  Duration: 12/17/2023 0951 to 12/18/2023 2130   Patient history: 78 y.o. female past history of anxiety, depression, hypertension, hyperlipidemia, insomnia who presented for evaluation of altered mental status after ingestion of THC. EEG to evaluate for seizure   Level of alertness: Awake, asleep   AEDs during EEG study: LCM   Technical aspects: This EEG study was done with scalp electrodes positioned according to the 10-20 International system of electrode placement. Electrical activity was reviewed with band pass filter of 1-70Hz , sensitivity of 7 uV/mm, display speed of 71mm/sec with a 60Hz  notched filter applied as appropriate. EEG data were recorded continuously and digitally stored.  Video monitoring was available and reviewed as appropriate.   Description: The posterior dominant rhythm consists of 8-9 Hz activity of moderate voltage (25-35 uV) seen predominantly in posterior head regions, symmetric and reactive to eye opening and eye closing.  Sleep was characterized by vertex waves, sleep spindles (12 to 14 Hz), maximal frontocentral region. Intermittent generalized 3-5hz  theta-delta slowing was also noted. Hyperventilation and photic stimulation were not performed.     Event button was pressed on 12/18/2023 at 1026.  Concomitant EEG before, during and after the event did not show any EEG to suggest seizure.  Event button was pressed on 12/18/2023 at 1032, 1400 and 1548 for jerking in lower extremities, staring at times. Concomitant EEG before, during and after the event did not show any EEG to suggest seizure.  EEG was disconnected between 12/17/2023 2020 to 12/18/2023 0754 due to technical difficulties  ABNORMALITY - Intermittent slow, generalized   IMPRESSION: This study is suggestive of mild generalized cerebral dysfunction (  encephalopathy). No seizures or epileptiform discharges were seen throughout the recording.  Event button was pressed on 12/18/2023 at 1026 without concomitant EEG change. This was most likely not an epileptic event.  Event button was pressed on 12/18/2023 at 1032, 1400 and 1548 for jerking in lower extremities and staring at times without concomitant EEG change. These were most likely NON epileptic .   A normal interictal EEG does not exclude the diagnosis of epilepsy.     Jaylenn Altier O Jaretssi Kraker

## 2023-12-18 NOTE — Progress Notes (Signed)
 Inpatient Rehab Admissions Coordinator Note:   Per therapy recomendations patient was screened for CIR candidacy by Reche FORBES Lowers, PT. At this time, pt appears to be a potential candidate for CIR. I will place an order for rehab consult for full assessment, per our protocol.  Please contact me any with questions.SABRA Reche Lowers, PT, DPT [phone] 12/18/23 4:01 PM

## 2023-12-18 NOTE — Progress Notes (Signed)
 [facility] night cross cover note:  I was notified by RN that this patient is agitated, confused, restless, pulling repeatedly attempting to independently get out of bed, with these behaviors refractory to attempts at verbal redirection.  In the setting of associated interference with ongoing medical treatment posing potential harm to themself, I have placed order for haldol  5 mg iv x 1 dose now as well as order for geodon  20 mg IM x 1 dose prn for refractory agitation.     Eva Pore, DO Hospitalist

## 2023-12-19 ENCOUNTER — Inpatient Hospital Stay (HOSPITAL_COMMUNITY)

## 2023-12-19 ENCOUNTER — Ambulatory Visit: Admitting: Family Medicine

## 2023-12-19 DIAGNOSIS — F0390 Unspecified dementia without behavioral disturbance: Secondary | ICD-10-CM | POA: Diagnosis not present

## 2023-12-19 DIAGNOSIS — I639 Cerebral infarction, unspecified: Secondary | ICD-10-CM | POA: Diagnosis not present

## 2023-12-19 DIAGNOSIS — R42 Dizziness and giddiness: Secondary | ICD-10-CM | POA: Diagnosis not present

## 2023-12-19 DIAGNOSIS — R410 Disorientation, unspecified: Secondary | ICD-10-CM

## 2023-12-19 DIAGNOSIS — R569 Unspecified convulsions: Secondary | ICD-10-CM | POA: Diagnosis not present

## 2023-12-19 LAB — GLUCOSE, CAPILLARY: Glucose-Capillary: 94 mg/dL (ref 70–99)

## 2023-12-19 LAB — RAPID URINE DRUG SCREEN, HOSP PERFORMED
Amphetamines: NOT DETECTED
Barbiturates: NOT DETECTED
Benzodiazepines: POSITIVE — AB
Cocaine: NOT DETECTED
Opiates: NOT DETECTED
Tetrahydrocannabinol: POSITIVE — AB

## 2023-12-19 LAB — CBC
HCT: 30.6 % — ABNORMAL LOW (ref 36.0–46.0)
Hemoglobin: 9.9 g/dL — ABNORMAL LOW (ref 12.0–15.0)
MCH: 27.4 pg (ref 26.0–34.0)
MCHC: 32.4 g/dL (ref 30.0–36.0)
MCV: 84.8 fL (ref 80.0–100.0)
Platelets: 378 K/uL (ref 150–400)
RBC: 3.61 MIL/uL — ABNORMAL LOW (ref 3.87–5.11)
RDW: 16.7 % — ABNORMAL HIGH (ref 11.5–15.5)
WBC: 8.8 K/uL (ref 4.0–10.5)
nRBC: 0 % (ref 0.0–0.2)

## 2023-12-19 LAB — CULTURE, BLOOD (ROUTINE X 2): Culture  Setup Time: NO GROWTH

## 2023-12-19 LAB — BASIC METABOLIC PANEL WITH GFR
Anion gap: 14 (ref 5–15)
BUN: 28 mg/dL — ABNORMAL HIGH (ref 8–23)
CO2: 22 mmol/L (ref 22–32)
Calcium: 8.5 mg/dL — ABNORMAL LOW (ref 8.9–10.3)
Chloride: 105 mmol/L (ref 98–111)
Creatinine, Ser: 0.97 mg/dL (ref 0.44–1.00)
GFR, Estimated: 59 mL/min — ABNORMAL LOW (ref >60)
Glucose, Bld: 116 mg/dL — ABNORMAL HIGH (ref 70–99)
Potassium: 3.4 mmol/L — ABNORMAL LOW (ref 3.5–5.1)
Sodium: 141 mmol/L (ref 135–145)

## 2023-12-19 LAB — PHOSPHORUS: Phosphorus: 2.5 mg/dL (ref 2.5–4.6)

## 2023-12-19 LAB — MAGNESIUM: Magnesium: 1.9 mg/dL (ref 1.7–2.4)

## 2023-12-19 MED ORDER — HYDRALAZINE HCL 25 MG PO TABS: 25.0000 mg | ORAL_TABLET | Freq: Three times a day (TID) | ORAL | Status: DC

## 2023-12-19 MED ORDER — POTASSIUM CHLORIDE 20 MEQ PO PACK
40.0000 meq | PACK | Freq: Once | ORAL | Status: AC
  Administered 2023-12-19: 40 meq via ORAL
  Filled 2023-12-19: qty 2

## 2023-12-19 MED ORDER — TRAZODONE HCL 100 MG PO TABS
100.0000 mg | ORAL_TABLET | Freq: Every day | ORAL | Status: DC
  Administered 2023-12-19 – 2023-12-22 (×4): 100 mg via ORAL
  Filled 2023-12-19 (×4): qty 1

## 2023-12-19 MED ORDER — SODIUM CHLORIDE 0.9 % IV SOLN
100.0000 mg | Freq: Two times a day (BID) | INTRAVENOUS | Status: DC
  Administered 2023-12-19 – 2023-12-21 (×4): 100 mg via INTRAVENOUS
  Filled 2023-12-19 (×5): qty 10

## 2023-12-19 MED ORDER — PANTOPRAZOLE SODIUM 40 MG PO TBEC
40.0000 mg | DELAYED_RELEASE_TABLET | Freq: Two times a day (BID) | ORAL | Status: DC
  Administered 2023-12-19 – 2023-12-23 (×7): 40 mg via ORAL
  Filled 2023-12-19 (×8): qty 1

## 2023-12-19 MED ORDER — IOHEXOL 350 MG/ML SOLN
75.0000 mL | Freq: Once | INTRAVENOUS | Status: AC | PRN
  Administered 2023-12-19: 75 mL via INTRAVENOUS

## 2023-12-19 MED ORDER — AMLODIPINE BESYLATE 5 MG PO TABS
5.0000 mg | ORAL_TABLET | Freq: Every day | ORAL | Status: DC
  Administered 2023-12-19 – 2023-12-23 (×5): 5 mg via ORAL
  Filled 2023-12-19 (×5): qty 1

## 2023-12-19 MED ORDER — BENAZEPRIL HCL 20 MG PO TABS
20.0000 mg | ORAL_TABLET | Freq: Every day | ORAL | Status: DC
  Administered 2023-12-19 – 2023-12-23 (×5): 20 mg via ORAL
  Filled 2023-12-19 (×6): qty 1

## 2023-12-19 NOTE — Progress Notes (Signed)
 LTM VIDEO EEG discontinued - no skin breakdown at [facility].

## 2023-12-19 NOTE — PMR Pre-admission (Addendum)
 PMR Admission Coordinator Pre-Admission Assessment  Patient: [name] is an 78 y.o., female MRN: [id number] DOB: 17-Jul-1945 Height: 5' 2 (157.5 cm) Weight: 83.2 kg  Insurance Information HMO:     PPO:      PCP:      IPA:      80/20: yes     OTHER:  PRIMARY: Medicare Part A and B      Policy#: [id number]      Subscriber: patient CM Name:       Phone#:      Fax#:  Pre-Cert#:       Employer:  Benefits:  Phone #: verified eligibility via [employer]     Name:  Eff. Date: Part A and B effective 10/14/09     Deduct: $1,676      Out of Pocket Max: NA      Life Max: NA CIR: 100% coverage      SNF: 100% coverage for days 1-20, 80% coverage for days 21-100 Outpatient: 80% coverage     Co-Pay: 20% Home Health: 100% coverage      Co-Pay:  DME: 80% coverage     Co-Pay: 20% Providers: pt's choice SECONDARY: U[employer]/UHC PPO      Policy#: [id number]     Phone#: [phone]  Financial Counselor:       Phone#:   The "Data Collection Information Summary" for patients in Inpatient Rehabilitation Facilities with attached "Privacy Act Statement-Health Care Records" was provided and verbally reviewed with: Family  Emergency Contact Information Contact Information     Name Relation Home Work Mobile   [place] Spouse   [phone]   Stephan, Draughn   [phone]      Other Contacts   None on File     Current Medical History  Patient Admitting Diagnosis: CVA History of Present Illness: Pt is a 78 year old female with medical hx significant for: HTN, hyperlipidemia, osteopenia, seizures, CKD. Pt was hospitalized in September with AMS. Pt presented to [facility] on 12/16/23 d/t falls and slurred speech. Pt hit head. Pt also reported nausea and loose bowel movements. CT head negative for acute abnormality. Chest x-ray revealed mild cardiomegaly with no acute infiltrates. C-spine, right knee, right rib, left shoulder x-rays negative. Urinalysis WNL. MRI showed possible punctate  stroke. MRA negative for LVO. LTM EEG on 11/4 negative for seizures. LTM EEG on 11/5 negative for seizures. Therapy evaluations completed and CIR recommended d/t pt's deficits in functional mobility. Complete NIHSS TOTAL: 1  Patient's medical record from [facility] has been reviewed by the rehabilitation admission coordinator and physician.  Past Medical History  Past Medical History:  Diagnosis Date   Allergy 1980's   Sulfa drugs and penecillin   Anxiety    Cataract 2021   Corrected   Chronic kidney disease 2024   Stage 3   Depression    Headache    SINUS   Hyperlipidemia    Hypertension 2021   Osteopenia    Osteopenia    Primary localized osteoarthritis of right knee    Seizures (HCC) 10/2023    Has the patient had major surgery during 100 days prior to admission? No  Family History   family history includes Alcohol abuse in her brother, maternal aunt, and maternal aunt; Anxiety disorder in her mother; Arthritis in her mother and sister; Asthma in her brother; Depression in her mother and son; Early death in her brother and father; Hearing loss in her father, mother, and sister; Heart attack  in her brother, brother, father, and mother; Heart disease in her brother, father, mother, and sister; Heart failure in her mother; Hyperlipidemia in her brother and sister; Hypertension in her brother and sister; Irregular heart beat in her sister; Varicose Veins in her mother.  Current Medications  Current Facility-Administered Medications:    acetaminophen  (TYLENOL ) tablet 650 mg, 650 mg, Oral, Q6H PRN, Rathore, Vasundhra, MD, 650 mg at 12/20/23 0506   acetaminophen  (TYLENOL ) tablet 650 mg, 650 mg, Oral, Q6H, Khatri, Pardeep, MD, 650 mg at 12/23/23 0602   amLODipine  (NORVASC ) tablet 5 mg, 5 mg, Oral, Daily, Leotis, Pardeep, MD, 5 mg at 12/23/23 9078   aspirin EC tablet 81 mg, 81 mg, Oral, Daily, Tobie Modest V, MD, 81 mg at 12/23/23 9078   atorvastatin  (LIPITOR) tablet 40 mg, 40  mg, Oral, Daily, Patel, Ekta V, MD, 40 mg at 12/23/23 9078   benazepril  (LOTENSIN ) tablet 20 mg, 20 mg, Oral, Daily, Leotis, Pardeep, MD, 20 mg at 12/23/23 0921   heparin injection 5,000 Units, 5,000 Units, Subcutaneous, Q8H, Tobie Modest V, MD, 5,000 Units at 12/23/23 0603   lacosamide  (VIMPAT ) tablet 100 mg, 100 mg, Oral, BID, Khatri, Pardeep, MD, 100 mg at 12/23/23 0921   methocarbamol (ROBAXIN) tablet 500 mg, 500 mg, Oral, Q8H PRN, Leotis, Pardeep, MD, 500 mg at 12/22/23 2137   ondansetron  (ZOFRAN ) injection 4 mg, 4 mg, Intravenous, Q6H PRN, Howerter, Justin B, DO, 4 mg at 12/17/23 0132   pantoprazole  (PROTONIX ) EC tablet 40 mg, 40 mg, Oral, BID, Chen, Lydia D, RPH, 40 mg at 12/23/23 9078   raloxifene  (EVISTA ) tablet 60 mg, 60 mg, Oral, Daily, Tobie Modest V, MD, 60 mg at 12/23/23 9078   sodium chloride  flush (NS) 0.9 % injection 3 mL, 3 mL, Intravenous, Q12H, Tobie Modest V, MD, 3 mL at 12/23/23 9077   traZODone  (DESYREL ) tablet 100 mg, 100 mg, Oral, QHS, Lindzen, Eric, MD, 100 mg at 12/22/23 2137   venlafaxine  XR (EFFEXOR -XR) 24 hr capsule 150 mg, 150 mg, Oral, Q breakfast, Tobie Modest V, MD, 150 mg at 12/23/23 9078  Patients Current Diet:  Diet Order             DIET DYS 3 Room service appropriate? Yes; Fluid consistency: Thin  Diet effective now                   Precautions / Restrictions Precautions Precautions: Fall Precaution/Restrictions Comments: monitor HR Restrictions Weight Bearing Restrictions Per Provider Order: No   Has the patient had 2 or more falls or a fall with injury in the past year? Yes  Prior Activity Level    Prior Functional Level Self Care: Did the patient need help bathing, dressing, using the toilet or eating? Independent  Indoor Mobility: Did the patient need assistance with walking from room to room (with or without device)? Independent  Stairs: Did the patient need assistance with internal or external stairs (with or without device)?  Independent  Functional Cognition: Did the patient need help planning regular tasks such as shopping or remembering to take medications? Independent  Patient Information Are you of Hispanic, Latino/a,or Spanish origin?: A. No, not of Hispanic, Latino/a, or Spanish origin What is your race?: A. White Do you need or want an interpreter to communicate with a doctor or health care staff?: 0. No  Patient's Response To:  Health Literacy and Transportation Health Literacy - How often do you need to have someone help you when you read instructions, pamphlets, or other  written material from your doctor or pharmacy?: Never In the past 12 months, has lack of transportation kept you from medical appointments or from getting medications?: No In the past 12 months, has lack of transportation kept you from meetings, work, or from getting things needed for daily living?: No  Home Assistive Devices / Equipment Home Equipment: Agricultural Consultant (2 wheels), [place] - single point, [employer] [facility] - tub/shower, Information systems manager  Prior Device Use: Indicate devices/aids used by the patient prior to current illness, exacerbation or injury? Walker (used occasionally)  Current Functional Level Cognition  Orientation Level: Oriented X4    Extremity Assessment (includes Sensation/Coordination)  Upper Extremity Assessment: Right hand dominant, Generalized weakness (proximal>distal weakness (husband reporting pt with hx arthritis)) RUE Deficits / Details: Grossly 4/5 (shoulder flexion, elbow flexion/extension) RUE Coordination: decreased gross motor LUE Deficits / Details: Grossly 4/5 (shoulder flexion, elbow flexion/extension) LUE Coordination: decreased gross motor  Lower Extremity Assessment: Generalized weakness RLE Deficits / Details: Grossly 4/5 (hip flexion, knee flexion/extension, ankle PF/DF) LLE Deficits / Details: Grossly 4/5 (hip flexion, knee flexion/extension, ankle PF/DF)    ADLs  Overall ADL's : Needs  assistance/impaired Eating/Feeding: Set up, Sitting Eating/Feeding Details (indicate cue type and reason): up in chair Grooming: Set up, Wash/dry face, Oral care, Applying deodorant, Sitting Grooming Details (indicate cue type and reason): seated EOB Upper Body Bathing: Set up, Sitting Lower Body Bathing: Minimal assistance, Sitting/lateral leans, Sit to/from stand Lower Body Bathing Details (indicate cue type and reason): thoroughness for peri area bathing Upper Body Dressing : Minimal assistance, Sitting Upper Body Dressing Details (indicate cue type and reason): gown exchange, assist for IV mgmt and threading clean gown fully over shoulders Lower Body Dressing: Minimal assistance, Sitting/lateral leans Lower Body Dressing Details (indicate cue type and reason): exchanging dirty socks for clean ones via figure four method with incr effort Toilet Transfer: Contact guard assist, Stand-pivot, BSC/3in1, Rolling walker (2 wheels) Toilet Transfer Details (indicate cue type and reason): cued for safe approach to [facility] Toileting- Clothing Manipulation and Hygiene: Minimal assistance, Sitting/lateral lean Toileting - Clothing Manipulation Details (indicate cue type and reason): attempted to void, unable Functional mobility during ADLs: Minimal assistance, Rolling walker (2 wheels)    Mobility  Overal bed mobility: Needs Assistance Bed Mobility: Supine to Sit Supine to sit: Contact guard, Used rails, HOB elevated Sit to supine: Contact guard assist General bed mobility comments: increased time    Transfers  Overall transfer level: Needs assistance Equipment used: None Transfers: Sit to/from Stand, Bed to chair/wheelchair/BSC Sit to Stand: Contact guard assist Bed to/from chair/wheelchair/BSC transfer type:: Step pivot Step pivot transfers: Contact guard assist General transfer comment: Pt appears shaky, but no buckling or LOB. Pt able to stand x2 and pivot to/from [facility] with CGA for safety     Ambulation / Gait / Stairs / Wheelchair Mobility  Ambulation/Gait General Gait Details: pt deferred due to fatigue and shaking    Posture / Balance Dynamic Sitting Balance Sitting balance - Comments: CGA sitting EOB Balance Overall balance assessment: Needs assistance Sitting-balance support: No upper extremity supported, Feet supported Sitting balance-Leahy Scale: Fair Sitting balance - Comments: CGA sitting EOB Standing balance support: Bilateral upper extremity supported, During functional activity, Reliant on assistive device for balance Standing balance-Leahy Scale: Fair Standing balance comment: CGA to pivot without AD    Special considerations/life events  Skin Ecchymosis: arm, back, buttocks, leg/bilateral and External Urinary Catheter   Previous Home Environment (from acute therapy documentation) Living Arrangements: Spouse/significant other Available Help  at Discharge: Family, Available 24 hours/day Type of Home: House Home Layout: One level Home Access: Stairs to enter Entrance Stairs-Rails: Right Entrance Stairs-Number of Steps: 2 Bathroom Shower/Tub: Health Visitor: Standard Bathroom Accessibility: No Home Care Services: Yes Type of Home Care Services: Home PT Additional Comments: son and spouse present to assist with providing PLOF (pt questionable historian)  Discharge Living Setting Plans for Discharge Living Setting: House Type of Home at Discharge: House Discharge Home Layout: One level Discharge Home Access: Stairs to enter Entrance Stairs-Rails: Right Entrance Stairs-Number of Steps: 2 Discharge Bathroom Shower/Tub: Walk-in shower Discharge Bathroom Toilet: Standard Discharge Bathroom Accessibility: No Does the patient have any problems obtaining your medications?: No  Social/Family/Support Systems Patient Roles: Spouse Contact Information: [phone] Anticipated Caregiver: Norleen Ability/Limitations of Caregiver: 24/7  Supervision Caregiver Availability: 24/7 Discharge Plan Discussed with Primary Caregiver: Yes Is Caregiver In Agreement with Plan?: Yes Does Caregiver/Family have Issues with Lodging/Transportation while Pt is in Rehab?: No  Goals Patient/Family Goal for Rehab: PT/OT Supervision Expected length of stay: 7-10 days Pt/Family Agrees to Admission and willing to participate: Yes Program Orientation Provided & Reviewed with Pt/Caregiver Including Roles  & Responsibilities: Yes  Decrease burden of Care through IP rehab admission: NA  Possible need for SNF placement upon discharge: Not anticipated  Patient Condition: I have reviewed medical records from [facility], spoken with CM, and patient and spouse. I met with patient at the bedside and discussed via phone for inpatient rehabilitation assessment.  Patient will benefit from ongoing PT, OT, and SLP, can actively participate in 3 hours of therapy a day 5 days of the week, and can make measurable gains during the admission.  Patient will also benefit from the coordinated team approach during an Inpatient Acute Rehabilitation admission.  The patient will receive intensive therapy as well as Rehabilitation physician, nursing, social worker, and care management interventions.  Due to bowel management, safety, skin/wound care, disease management, medication administration, pain management, and patient education the patient requires 24 hour a day rehabilitation nursing.  The patient is currently  with mobility and basic ADLs.  Discharge setting and therapy post discharge at home with home health is anticipated.  Patient has agreed to participate in the Acute Inpatient Rehabilitation Program and will admit today.  Preadmission Screen Completed By:  Tinnie SHAUNNA Yvone Delayne, 12/23/2023 10:57 AM ______________________________________________________________________   Discussed status with Dr. Lorilee  on 12/23/23 at 1051 and received approval for admission  today.  Admission Coordinator:  Tinnie SHAUNNA Yvone Delayne, CCC-SLP, time 1051/Date 12/23/23   Assessment/Plan: Diagnosis: Debility 2/2 dizziness 2/2 dehydration and hypovolemia Does the need for close, 24 hr/day Medical supervision in concert with the patient's rehab needs make it unreasonable for this patient to be served in a less intensive setting? Yes Co-Morbidities requiring supervision/potential complications: dysarthria, left sided rib fractures, anion gap metabolic acidosis, chronic diarrhea, hypokalemia and hypomagnesemia Due to bladder management, bowel management, safety, skin/wound care, disease management, medication administration, pain management, and patient education, does the patient require 24 hr/day rehab nursing? Yes Does the patient require coordinated care of a physician, rehab nurse, PT, OT to address physical and functional deficits in the context of the above medical diagnosis(es)? Yes Addressing deficits in the following areas: balance, endurance, locomotion, strength, transferring, bowel/bladder control, bathing, dressing, feeding, grooming, toileting, and psychosocial support Can the patient actively participate in an intensive therapy program of at least 3 hrs of therapy 5 days a week? Yes The potential for patient to  make measurable gains while on inpatient rehab is excellent Anticipated functional outcomes upon discharge from inpatient rehab: supervision PT, supervision OT, supervision SLP Estimated rehab length of stay to reach the above functional goals is: 10-14 days Anticipated discharge destination: Home 10. Overall Rehab/Functional Prognosis: excellent   MD Signature: Sven Elks, MD

## 2023-12-19 NOTE — Progress Notes (Signed)
 PROGRESS NOTE    [name]  FMW:[id number] DOB: July 26, 1945 DOA: 12/16/2023  PCP: Wendolyn Jenkins Jansky, MD   Brief Narrative:  This 78 yrs old female with PMH significant for hypertension, hyperlipidemia, anxiety, previous history of stroke presented in the ED with altered mental status.  Patient reports that she has been having difficulty with her speech over the last couple of days as well as difficulty with her balance.  On her prior hospitalization she was monitored on EEG which did not demonstrate any seizure activity.  Patient was started on Vimpat  50 mg twice daily, it is uncertain if she is taking medicine.  LP was attempted but was unsuccessful even with fluoroscopy guidance.  Encephalopathy has  improved and She was discharged home. Workup in the ED reveals CT head negative for acute intracranial abnormality.  Patient was admitted for further evaluation.  Neurology is consulted.  Assessment & Plan:   Principal Problem:   Dizziness Active Problems:   Essential hypertension  Dizziness: Patient presented with dizziness and subsequent fall likely due to dehydration and hypovolemia. Continue IV fluid resuscitation, Continue fall precautions. PT/ OT > recommended acute inpatient rehab but family prefers home health services.  Dysarthria: MRI concerning for stroke. Neurology consulted recommended to continue with the stroke workup. Continue EEG monitoring. MRA > No large vessel occlusion or other vascular abnormality. Vit B12 235, Ammonia level 36, hemoglobin A1c 5.7. May consider repeating LP if no improvement. EEG suggestive of mild generalized cerebral dysfunction, no seizure or epileptic discharges were seen. Echo shows LVEF 65 to 70%, left ventricle has normal function.  No RWMA.    Anion gap metabolic acidosis: Likely due to lactic acidosis which is resolved with IV hydration.  Chronic diarrhea: Patient has a GI appointment that is upcoming. This could be  Infectious  or GIB .   Less likely inflammatory or microscopic colitis or malignancy. Obtain stool studies. Follow up outpatient.   Hypokalemia: Replaced.  Continue to monitor   Acute kidney injury: Baseline serum creatinine normal.  Presented with creatinine of 2.04 Serum creatinine improved with IV hydration.  AKI resolved.  Generalized anxiety disorder / depression: Continue venlafaxine .   Seizure disorder: Continue Vimpat .   Continue seizure precautions,  aspiration and fall precautions.  Suspect pt had neuro event from hypoxia in September admission less likely seizure,    DVT prophylaxis: Heparin Code Status:Full code Family Communication: Family at bed side. Disposition Plan:   Status is: Inpatient Remains inpatient appropriate because:      Admitted for acute encephalopathy likely multifactorial.  Workup is in process , Neurology is consulted.  EEG in process.    Consultants:  Neurology  Procedures: CT Head.  Antimicrobials:  Anti-infectives (From admission, onward)    None      Subjective: Patient was seen and examined at bedside.  Overnight events noted. Patient reports feeling frustrated, states they want to hear from neurologist. Long-term EEG in process  Objective: Vitals:   12/19/23 0413 12/19/23 0752 12/19/23 0752 12/19/23 1127  BP:  (!) 164/77 (!) 164/77 (!) 151/80  Pulse:  91 91 94  Resp:  20 20 20   Temp:  98 F (36.7 C) 98 F (36.7 C) 98.2 F (36.8 C)  TempSrc:  Oral Oral Oral  SpO2:  97% 97% 96%  Weight: 84.7 kg     Height:        Intake/Output Summary (Last 24 hours) at 12/19/2023 1205 Last data filed at 12/19/2023 0350 Gross per 24 hour  Intake 285 ml  Output --  Net 285 ml   Filed Weights   12/16/23 1038 12/18/23 0500 12/19/23 0413  Weight: 81.6 kg 82.8 kg 84.7 kg    Examination:  General exam: Appears calm and comfortable, not in any acute distress. Respiratory system: CTA Bilaterally.  Respiratory effort normal.  RR  13 Cardiovascular system: S1 & S2 heard, RRR. No JVD, murmurs, rubs, gallops or clicks. No pedal edema. Gastrointestinal system: Abdomen is non distended, soft and non tender. Normal bowel sounds heard. Central nervous system: Alert and oriented x 2. No focal neurological deficits. Extremities: No edema, no cyanosis, no clubbing. Skin: No rashes, lesions or ulcers Psychiatry: Judgement and insight appear normal. Mood & affect appropriate.     Data Reviewed: I have personally reviewed following labs and imaging studies  CBC: Recent Labs  Lab 12/16/23 1043 12/16/23 1203 12/16/23 2112 12/18/23 0255 12/19/23 0137  WBC 14.3*  --   --  10.4 8.8  NEUTROABS 12.6*  --   --   --   --   HGB 12.4 13.3 10.2* 10.8* 9.9*  HCT 38.3 39.0 31.5* 33.5* 30.6*  MCV 83.8  --   --  84.4 84.8  PLT 505*  --   --  454* 378   Basic Metabolic Panel: Recent Labs  Lab 12/16/23 1043 12/16/23 1203 12/16/23 1658 12/16/23 2027 12/17/23 0602 12/18/23 0255 12/19/23 0137  NA 134* 133*  --  135 138 140 141  K 3.4* 3.2*  --  3.0* 3.4* 3.3* 3.4*  CL 95* 101  --  100 102 105 105  CO2 15*  --   --  19* 22 20* 22  GLUCOSE 133* 128*  --  105* 121* 133* 116*  BUN 22 23  --  24* 22 24* 28*  CREATININE 2.04* 2.10*  --  1.75* 1.37* 1.15* 0.97  CALCIUM  9.3  --   --  8.4* 8.5* 8.6* 8.5*  MG  --   --  2.1 1.9  --  2.0 1.9  PHOS  --   --   --   --   --  3.1 2.5   GFR: Estimated Creatinine Clearance: 47.4 mL/min (by C-G formula based on SCr of 0.97 mg/dL). Liver Function Tests: Recent Labs  Lab 12/16/23 1043  AST 33  ALT 25  ALKPHOS 103  BILITOT 1.1  PROT 7.5  ALBUMIN 4.1   No results for input(s): LIPASE, AMYLASE in the last 168 hours. Recent Labs  Lab 12/17/23 0602  AMMONIA 36*   Coagulation Profile: Recent Labs  Lab 12/16/23 1043  INR 1.1   Cardiac Enzymes: Recent Labs  Lab 12/16/23 1658  CKTOTAL 230   BNP (last 3 results) No results for input(s): PROBNP in the last 8760  hours. HbA1C: Recent Labs    12/16/23 1658 12/17/23 0602  HGBA1C 5.7* 5.7*   CBG: Recent Labs  Lab 12/17/23 0644 12/18/23 0645 12/18/23 1147 12/18/23 1626 12/19/23 0610  GLUCAP 115* 90 114* 117* 94   Lipid Profile: Recent Labs    12/17/23 0602  CHOL 134  HDL 57  LDLCALC 50  TRIG 135  CHOLHDL 2.4   Thyroid  Function Tests: Recent Labs    12/16/23 1658  TSH 1.444  FREET4 0.98   Anemia Panel: Recent Labs    12/17/23 0602  VITAMINB12 235  FOLATE >20.0   Sepsis Labs: Recent Labs  Lab 12/16/23 1434 12/16/23 1700 12/16/23 2032 12/16/23 2225  LATICACIDVEN 2.0* 3.1* 1.2 1.0    Recent  Results (from the past 240 hours)  Blood culture (routine x 2)     Status: Abnormal   Collection Time: 12/16/23  8:25 PM   Specimen: BLOOD  Result Value Ref Range Status   Specimen Description BLOOD LEFT ANTECUBITAL  Final   Special Requests   Final    BOTTLES DRAWN AEROBIC AND ANAEROBIC Blood Culture results may not be optimal due to an inadequate volume of blood received in culture bottles   Culture  Setup Time   Final    GRAM POSITIVE COCCI IN BOTH AEROBIC AND ANAEROBIC BOTTLES CRITICAL RESULT CALLED TO, READ BACK BY AND VERIFIED WITH: PHARMD C. AMEND [id number] AT 1650, ADC Performed at [facility], [address], [place], [place] [address]    Culture STAPHYLOCOCCUS EPIDERMIDIS (A)  Final   Report Status 12/19/2023 FINAL  Final   Organism ID, Bacteria STAPHYLOCOCCUS EPIDERMIDIS  Final      Susceptibility   Staphylococcus epidermidis - MIC*    CIPROFLOXACIN <=0.5 SENSITIVE Sensitive     ERYTHROMYCIN >=8 RESISTANT Resistant     GENTAMICIN <=0.5 SENSITIVE Sensitive     OXACILLIN >=4 RESISTANT Resistant     TETRACYCLINE >=16 RESISTANT Resistant     VANCOMYCIN  2 SENSITIVE Sensitive     TRIMETH/SULFA 160 RESISTANT Resistant     CLINDAMYCIN <=0.25 SENSITIVE Sensitive     RIFAMPIN <=0.5 SENSITIVE Sensitive     Inducible Clindamycin NEGATIVE Sensitive     *  STAPHYLOCOCCUS EPIDERMIDIS  Blood Culture ID Panel (Reflexed)     Status: Abnormal   Collection Time: 12/16/23  8:25 PM  Result Value Ref Range Status   Enterococcus faecalis NOT DETECTED NOT DETECTED Final   Enterococcus Faecium NOT DETECTED NOT DETECTED Final   Listeria monocytogenes NOT DETECTED NOT DETECTED Final   Staphylococcus species DETECTED (A) NOT DETECTED Final    Comment: CRITICAL RESULT CALLED TO, READ BACK BY AND VERIFIED WITH: PHARMD C. AMEND [id number] AT 1650, ADC    Staphylococcus aureus (BCID) NOT DETECTED NOT DETECTED Final   Staphylococcus epidermidis DETECTED (A) NOT DETECTED Final    Comment: Methicillin (oxacillin) resistant coagulase negative staphylococcus. Possible blood culture contaminant (unless isolated from more than one blood culture draw or clinical case suggests pathogenicity). No antibiotic treatment is indicated for blood  culture contaminants. CRITICAL RESULT CALLED TO, READ BACK BY AND VERIFIED WITH: PHARMD C. AMEND [id number] AT 1650, ADC    Staphylococcus lugdunensis NOT DETECTED NOT DETECTED Final   Streptococcus species NOT DETECTED NOT DETECTED Final   Streptococcus agalactiae NOT DETECTED NOT DETECTED Final   Streptococcus pneumoniae NOT DETECTED NOT DETECTED Final   Streptococcus pyogenes NOT DETECTED NOT DETECTED Final   A.calcoaceticus-baumannii NOT DETECTED NOT DETECTED Final   Bacteroides fragilis NOT DETECTED NOT DETECTED Final   Enterobacterales NOT DETECTED NOT DETECTED Final   Enterobacter cloacae complex NOT DETECTED NOT DETECTED Final   Escherichia coli NOT DETECTED NOT DETECTED Final   Klebsiella aerogenes NOT DETECTED NOT DETECTED Final   Klebsiella oxytoca NOT DETECTED NOT DETECTED Final   Klebsiella pneumoniae NOT DETECTED NOT DETECTED Final   Proteus species NOT DETECTED NOT DETECTED Final   Salmonella species NOT DETECTED NOT DETECTED Final   Serratia marcescens NOT DETECTED NOT DETECTED Final   Haemophilus influenzae NOT  DETECTED NOT DETECTED Final   Neisseria meningitidis NOT DETECTED NOT DETECTED Final   Pseudomonas aeruginosa NOT DETECTED NOT DETECTED Final   Stenotrophomonas maltophilia NOT DETECTED NOT DETECTED Final   Candida albicans NOT DETECTED NOT  DETECTED Final   Candida auris NOT DETECTED NOT DETECTED Final   Candida glabrata NOT DETECTED NOT DETECTED Final   Candida krusei NOT DETECTED NOT DETECTED Final   Candida parapsilosis NOT DETECTED NOT DETECTED Final   Candida tropicalis NOT DETECTED NOT DETECTED Final   Cryptococcus neoformans/gattii NOT DETECTED NOT DETECTED Final   Methicillin resistance mecA/C DETECTED (A) NOT DETECTED Final    Comment: CRITICAL RESULT CALLED TO, READ BACK BY AND VERIFIED WITH: PHARMD C. AMEND [id number] AT 1650, ADC Performed at [facility], [address], [place], [place] [address]   Blood culture (routine x 2)     Status: Abnormal   Collection Time: 12/16/23  8:30 PM   Specimen: BLOOD LEFT FOREARM  Result Value Ref Range Status   Specimen Description BLOOD LEFT FOREARM  Final   Special Requests   Final    BOTTLES DRAWN AEROBIC AND ANAEROBIC Blood Culture results may not be optimal due to an inadequate volume of blood received in culture bottles   Culture  Setup Time   Final    GRAM POSITIVE COCCI IN CLUSTERS AEROBIC BOTTLE ONLY CRITICAL VALUE NOTED.  VALUE IS CONSISTENT WITH PREVIOUSLY REPORTED AND CALLED VALUE.    Culture (A)  Final    STAPHYLOCOCCUS EPIDERMIDIS SUSCEPTIBILITIES PERFORMED ON PREVIOUS CULTURE WITHIN THE LAST 5 DAYS. Performed at [facility], [address], [place], [place] [address]    Report Status 12/19/2023 FINAL  Final    Radiology Studies: DG CHEST PORT 1 VIEW Result Date: 12/18/2023 EXAM: 1 VIEW(S) XRAY OF THE CHEST 12/18/2023 08:41:00 AM COMPARISON: 12/16/2023 CLINICAL HISTORY: Shortness of breath FINDINGS: LUNGS AND PLEURA: High and low lung volumes. Indistinct left costophrenic angle, cannot exclude trace left  pleural fluid. No focal pulmonary opacity. No pulmonary edema. No pneumothorax. HEART AND MEDIASTINUM: Mild cardiomegaly, stable. Aortic arch atherosclerosis. BONES AND SOFT TISSUES: Thoracic spondylosis. IMPRESSION: 1. No acute cardiopulmonary process identified. 2. Indistinct left costophrenic angle, trace left pleural fluid cannot be excluded. 3. Mild cardiomegaly, stable. 4. Aortic arch atherosclerosis. 5. Thoracic spondylosis. Electronically signed by: Ryan Salvage MD 12/18/2023 06:08 PM EST RP Workstation: [id number]   Overnight EEG with video Result Date: 12/18/2023 Shelton Arlin KIDD, MD     12/19/2023  9:07 AM Patient Name: [name] MRN: [id number] Epilepsy Attending: Arlin KIDD Shelton Referring Physician/Provider: Michaela Aisha SQUIBB, MD Duration: 12/17/2023 0951 to 12/18/2023 2130  Patient history: 78 y.o. female past history of anxiety, depression, hypertension, hyperlipidemia, insomnia who presented for evaluation of altered mental status after ingestion of THC. EEG to evaluate for seizure  Level of alertness: Awake, asleep  AEDs during EEG study: LCM  Technical aspects: This EEG study was done with scalp electrodes positioned according to the 10-20 International system of electrode placement. Electrical activity was reviewed with band pass filter of 1-70Hz , sensitivity of 7 uV/mm, display speed of 25mm/sec with a 60Hz  notched filter applied as appropriate. EEG data were recorded continuously and digitally stored.  Video monitoring was available and reviewed as appropriate.  Description: The posterior dominant rhythm consists of 8-9 Hz activity of moderate voltage (25-35 uV) seen predominantly in posterior head regions, symmetric and reactive to eye opening and eye closing.  Sleep was characterized by vertex waves, sleep spindles (12 to 14 Hz), maximal frontocentral region. Intermittent generalized 3-5hz  theta-delta slowing was also noted. Hyperventilation and photic stimulation were  not performed.   Event button was pressed on 12/18/2023 at 1026.  Concomitant EEG before, during and  after the event did not show any EEG to suggest seizure. Event button was pressed on 12/18/2023 at 1032, 1400 and 1548 for jerking in lower extremities, staring at times. Concomitant EEG before, during and after the event did not show any EEG to suggest seizure. EEG was disconnected between 12/17/2023 2020 to 12/18/2023 0754 due to technical difficulties ABNORMALITY - Intermittent slow, generalized  IMPRESSION: This study is suggestive of mild generalized cerebral dysfunction ( encephalopathy). No seizures or epileptiform discharges were seen throughout the recording. Event button was pressed on 12/18/2023 at 1026 without concomitant EEG change. This was most likely not an epileptic event. Event button was pressed on 12/18/2023 at 1032, 1400 and 1548 for jerking in lower extremities and staring at times without concomitant EEG change. These were most likely NON epileptic .  A normal interictal EEG does not exclude the diagnosis of epilepsy.   Priyanka O Yadav   Scheduled Meds:  amLODipine   5 mg Oral Daily   aspirin EC  81 mg Oral Daily   atorvastatin   40 mg Oral Daily   benazepril   20 mg Oral Daily   heparin  5,000 Units Subcutaneous Q8H   pantoprazole   40 mg Oral BID   potassium chloride   40 mEq Oral Once   raloxifene   60 mg Oral Daily   sodium chloride  flush  3 mL Intravenous Q12H   venlafaxine  XR  150 mg Oral Q breakfast   Continuous Infusions:  lacosamide  (VIMPAT ) IV 200 mg (12/19/23 1036)     LOS: 2 days    Time spent: 35 mins    Darcel Dawley, MD [facility]   If 7PM-7AM, please contact night-coverage

## 2023-12-19 NOTE — Progress Notes (Addendum)
 Inpatient Rehab Admissions:  Inpatient Rehab Consult received.  I met with patient at the bedside for rehabilitation assessment and to discuss goals and expectations of an inpatient rehab admission.  Discussed average length of stay and discharge home after completion of CIR. Pt acknowledged understanding. Pt prefers to have HH therapy instead of CIR. TOC made aware.  Called pt's husband son to discuss CIR. Left messages; awaiting return call.  Pt's husband Ubaldo  returned call. Discussed CIR goals and expectations. Discussed average length of stay and discharge home after completion of CIR. He acknowledged understanding. Ubaldo would like pt to pursue CIR. He confirmed that pt will have 24/7 support after discharge. Will continue to follow.  Signed: Tinnie Yvone Cohens, MS, CCC-SLP Admissions Coordinator [phone]

## 2023-12-19 NOTE — Progress Notes (Signed)
 vLTM maintenance  All impedances below 10k  No skin breakdown noted at  A1 A2  CZ PZ

## 2023-12-19 NOTE — Procedures (Signed)
 Patient Name: [name]  MRN: [id number]  Epilepsy Attending: Arlin MALVA Krebs  Referring Physician/Provider: Michaela Aisha SQUIBB, MD  Duration: 12/18/2023 2130 to 12/19/2023 1626   Patient history: 78 y.o. female past history of anxiety, depression, hypertension, hyperlipidemia, insomnia who presented for evaluation of altered mental status after ingestion of THC. EEG to evaluate for seizure   Level of alertness: Awake, asleep   AEDs during EEG study: LCM   Technical aspects: This EEG study was done with scalp electrodes positioned according to the 10-20 International system of electrode placement. Electrical activity was reviewed with band pass filter of 1-70Hz , sensitivity of 7 uV/mm, display speed of 45mm/sec with a 60Hz  notched filter applied as appropriate. EEG data were recorded continuously and digitally stored.  Video monitoring was available and reviewed as appropriate.   Description: The posterior dominant rhythm consists of 8-9 Hz activity of moderate voltage (25-35 uV) seen predominantly in posterior head regions, symmetric and reactive to eye opening and eye closing.  Sleep was characterized by vertex waves, sleep spindles (12 to 14 Hz), maximal frontocentral region. Intermittent generalized 3-5hz  theta-delta slowing was also noted. Hyperventilation and photic stimulation were not performed.       ABNORMALITY - Intermittent slow, generalized   IMPRESSION: This study is suggestive of mild generalized cerebral dysfunction ( encephalopathy). No seizures or epileptiform discharges were seen throughout the recording.    Teesha Ohm O Jalasia Eskridge

## 2023-12-19 NOTE — Evaluation (Signed)
 Occupational Therapy Evaluation Patient Details Name: [name] MRN: [id number] DOB: 09/30/1945 Today's Date: 12/19/2023   History of Present Illness   Pt is a 78 y.o. F who presents 12/16/2023 with multiple falls from feeling dizzy, slurred speech, and SOB. Hit head during fall on 10/31. MRI showed punctate 3 mm focus of restricted diffusion involving the mesial right  temporal lobe/hippocampal formation. No associated hemorrhage. Primary  differential considerations include a punctate acute ischemic nonhemorrhagic  infarct versus changes of transient global amnesia. Performing EEG to rule out seizure. Was recently admitted 10/2023 for seizure-like activity. MRI with no evidence of acute intracranial abnormality. PMH positive for HTN, GERD, MDD, GAD, insomina, CVA.     Clinical Impressions Pt received in supine, agreeable for OT visit. AOX3, repeating limited recollection of EEG/reason for hospitalization and remains confused as to why she is here. Redirectable and participatory; appearing anxious with pt shaking and elevated HR during session today. She presents with generalized weakness and significant cognitive deficits related to sequencing, problem solving, attention, etc. Functionally, she needed min A for bed mobility, STS transfers, and stepping to [facility] with RW. Max A for LB ADLs and min A with extensive cog cues for successful grooming at EOB. Tachycardic with rates in 110-120s throughout, although pt tolerated session well. EEG tech present mid session to remove EEG probes. RN updated on pt status.  Pt is currently functioning below baseline and would benefit from ongoing acute OT services to progress towards safe discharge and to facilitate return to prior level of function. Believe pt is an excellent candidate for high-intensity post-acute rehab (>3 hours/day).     If plan is discharge home, recommend the following:   A lot of help with bathing/dressing/bathroom;A lot of help  with walking and/or transfers;Assistance with cooking/housework;Direct supervision/assist for medications management;Direct supervision/assist for financial management;Assist for transportation;Help with stairs or ramp for entrance;Supervision due to cognitive status     Functional Status Assessment   Patient has had a recent decline in their functional status and demonstrates the ability to make significant improvements in function in a reasonable and predictable amount of time.     Equipment Recommendations   Other (comment) (defer to next level of care)     Recommendations for Other Services   Rehab consult     Precautions/Restrictions   Precautions Precautions: Fall Recall of Precautions/Restrictions: Impaired Precaution/Restrictions Comments: monitor HR Restrictions Weight Bearing Restrictions Per Provider Order: No     Mobility Bed Mobility Overal bed mobility: Needs Assistance Bed Mobility: Supine to Sit, Sit to Supine     Supine to sit: Min assist, HOB elevated Sit to supine: Mod assist, HOB elevated   General bed mobility comments: assist for trunk elevation and BLE mgmt for sit>supine    Transfers Overall transfer level: Needs assistance Equipment used: None Transfers: Sit to/from Stand, Bed to chair/wheelchair/BSC Sit to Stand: Min assist     Step pivot transfers: Min assist     General transfer comment: Stood from bed with cues for hand placement and powering up.      Balance Overall balance assessment: Needs assistance Sitting-balance support: Single extremity supported, Bilateral upper extremity supported, No upper extremity supported, Feet supported Sitting balance-Leahy Scale: Fair Sitting balance - Comments: intermittent CGA to prevent posterior LOB, pt rather shaky and tremulous today (reports potentially nervous to mobilize)   Standing balance support: Bilateral upper extremity supported, During functional activity, Reliant on  assistive device for balance Standing balance-Leahy Scale: Poor Standing balance comment: min  A with RW for short bout of mobility in room, very shaky and unsteady; pt with poor proximity to RW often needing cues to correct for improved safety                           ADL either performed or assessed with clinical judgement   ADL Overall ADL's : Needs assistance/impaired Eating/Feeding: Supervision/ safety;Set up;Sitting Eating/Feeding Details (indicate cue type and reason): very shaky when bringing cup to mouth, supervision provided to prevent spillage Grooming: Minimal assistance;Wash/dry hands;Oral care;Sitting Grooming Details (indicate cue type and reason): cognitive cues for sequencing oral hygiene, needs A for re-applying toothpaste accurately onto toothbrush             Lower Body Dressing: Maximal assistance;Sitting/lateral leans Lower Body Dressing Details (indicate cue type and reason): adjusting B socks Toilet Transfer: Minimal assistance;Cueing for sequencing;Stand-pivot;BSC/3in1;Rolling walker (2 wheels) Toilet Transfer Details (indicate cue type and reason): cued for safe approach and controlled descent Toileting- Clothing Manipulation and Hygiene: Minimal assistance;Sitting/lateral lean Toileting - Clothing Manipulation Details (indicate cue type and reason): assist for thoroughness with anterior peri hygiene     Functional mobility during ADLs: Minimal assistance;Rolling walker (2 wheels)       Vision Baseline Vision/History: 1 Wears glasses Ability to See in Adequate Light: 0 Adequate Patient Visual Report: No change from baseline       Perception         Praxis         Pertinent Vitals/Pain Pain Assessment Pain Assessment: No/denies pain     Extremity/Trunk Assessment Upper Extremity Assessment Upper Extremity Assessment: Right hand dominant;Generalized weakness (proximal>distal weakness (husband reporting pt with hx arthritis))    Lower Extremity Assessment Lower Extremity Assessment: Generalized weakness       Communication Communication Communication: Impaired Factors Affecting Communication: Reduced clarity of speech;Difficulty expressing self (verbose, tangential)   Cognition Arousal: Alert Behavior During Therapy: Anxious, Flat affect (anxious-appearing) Cognition: Cognition impaired   Orientation impairments: Situation Awareness: Intellectual awareness impaired Memory impairment (select all impairments): Short-term memory, Working memory Attention impairment (select first level of impairment): Sustained attention Executive functioning impairment (select all impairments): Organization, Sequencing, Reasoning, Problem solving OT - Cognition Comments: pt kept repeating I just woke up and this was connected to me, I didn't give them permission to do that re: EEG; redirectable; poor problem solving & sequencing                 Following commands: Impaired Following commands impaired: Follows one step commands inconsistently, Follows one step commands with increased time     Cueing  General Comments   Cueing Techniques: Verbal cues;Gestural cues  supportive husband & son present   Exercises     Shoulder Instructions      Home Living Family/patient expects to be discharged to:: Private residence Living Arrangements: Spouse/significant other Available Help at Discharge: Family;Available 24 hours/day Type of Home: House Home Access: Stairs to enter [employer] of Steps: 2 Entrance Stairs-Rails: Right Home Layout: One level     Bathroom Shower/Tub: Producer, Television/film/video: Standard Bathroom Accessibility: No   Home Equipment: Agricultural Consultant (2 wheels);Cane - single point;Grab bars - tub/shower;Shower seat   Additional Comments: son and spouse present to assist with providing PLOF (pt questionable historian)      Prior Functioning/Environment Prior Level of  Function : Independent/Modified Independent;History of Falls (last six months) (only fall on 10/31 per family)  Mobility Comments: No AD PTA ADLs Comments: indep, + driving, medication & financial mgmt prior to previous hospitalization in Sept    OT Problem List: Decreased strength;Decreased range of motion;Decreased activity tolerance;Impaired balance (sitting and/or standing);Decreased coordination;Decreased cognition;Decreased safety awareness   OT Treatment/Interventions: Self-care/ADL training;Therapeutic exercise;Therapeutic activities;Cognitive remediation/compensation;Patient/family education;Balance training      OT Goals(Current goals can be found in the care plan section)   Acute Rehab OT Goals Patient Stated Goal: get back her independence OT Goal Formulation: With patient/family Time For Goal Achievement: 01/02/24 Potential to Achieve Goals: Good   OT Frequency:  Min 2X/week    Co-evaluation              AM-PAC OT 6 Clicks Daily Activity     Outcome Measure Help from another person eating meals?: A Little Help from another person taking care of personal grooming?: A Little Help from another person toileting, which includes using toliet, bedpan, or urinal?: A Little Help from another person bathing (including washing, rinsing, drying)?: A Lot Help from another person to put on and taking off regular upper body clothing?: A Little Help from another person to put on and taking off regular lower body clothing?: A Lot 6 Click Score: 16   End of Session Equipment Utilized During Treatment: Gait belt;Rolling walker (2 wheels) Nurse Communication: Mobility status  Activity Tolerance: Patient tolerated treatment well Patient left: in bed;with call bell/phone within reach;with bed alarm set;with family/visitor present;with SCD's reapplied  OT Visit Diagnosis: Unsteadiness on feet (R26.81);History of falling (Z91.81);Muscle weakness (generalized)  (M62.81);Cognitive communication deficit (R41.841) Symptoms and signs involving cognitive functions: Other cerebrovascular disease                Time: 1614-1700 OT Time Calculation (min): 46 min Charges:  OT General Charges $OT Visit: 1 Visit OT Evaluation $OT Eval Moderate Complexity: 1 Mod OT Treatments $Self Care/Home Management : 23-37 mins  Concettina Leth D., MSOT, OTR/L Acute Rehabilitation Services [phone] Secure Chat Preferred  Rikki Milch 12/19/2023, 5:37 PM

## 2023-12-19 NOTE — Progress Notes (Signed)
 NEUROLOGY CONSULT FOLLOW UP NOTE   Date of service: December 19, 2023 Patient Name: [name] MRN:  [id number] DOB:  [birth year]  Interval Hx/subjective   Seen in room with no family at the bedside. Patient states her speech is improved today. She does endorse a pins and needles sensation intermittently in her bilateral upper extremities. She also endorses a fall on Saturday, but denies loss of consciousness or hitting her head. She does still have some transient word salad when she speaks fast, but overall her conversational speech is appropriate and she is able to tell me that she feels it is improved today in comparison to yesterday. Identifies objects appropriately. Denies any THC use since last taking a THC gummie 2 weeks ago, which is corroborated by her husband, and states I will never take that again. She has been having intermittent visual hallucinations of dead relatives over the past several days, with delusional thoughts that they are still alive.   Vitals   Vitals:   12/18/23 2023 12/18/23 2318 12/19/23 0337 12/19/23 0413  BP: 139/66 (!) 132/99 (!) 156/72   Pulse: 88 86 88   Resp: 18 18 18    Temp: (!) 97.5 F (36.4 C) 98.5 F (36.9 C) 97.7 F (36.5 C)   TempSrc: Oral Oral Oral   SpO2: 95% 96% 97%   Weight:    84.7 kg  Height:         Body mass index is 34.15 kg/m.  Physical Exam   Constitutional: Appears well-developed and well-nourished.  Psych: Appears slightly anxious Eyes: No scleral injection.  HENT: No OP obstrucion.  Head: Normocephalic.  Cardiovascular: Normal rate and regular rhythm.  Respiratory: Effort normal, non-labored breathing.  Skin: WDI.   Neurologic Examination   Mental Status: Patient awakens easily to voice. Alert and oriented x4. States name, date, month, year, location. Able to tell me why she is in the hospital.  Speech/Language: speech is without dysarthria. Starts having inappropriate word choice with occasional phonemic  paraphasias when speaking fast, but when she slows her speech down this seems to improve. Identifies objects correctly Cranial Nerves:  II: PERRL.   III, IV, VI: Extraocular movements intact V: Sensation is intact to light touch and symmetrical to face.  VII: Smile is symmetrical.  VIII: hearing intact to voice. IX, X: Phonation is normal.  XII: tongue is midline without fasciculations. Motor: Able to move all 4 extremities symmetrically with antigravity strength and resists 5/5 x 4 Tone is normal and bulk is normal Sensation- Intact to light touch bilaterally.   Coordination: FTN intact bilaterally Gait- deferred   Medications  Current Facility-Administered Medications:    aspirin EC tablet 81 mg, 81 mg, Oral, Daily, Tobie Modest V, MD, 81 mg at 12/18/23 1041   atorvastatin  (LIPITOR) tablet 40 mg, 40 mg, Oral, Daily, Patel, Ekta V, MD, 40 mg at 12/18/23 1040   heparin injection 5,000 Units, 5,000 Units, Subcutaneous, Q8H, Tobie Modest V, MD, 5,000 Units at 12/19/23 0608   lacosamide  (VIMPAT ) 200 mg in sodium chloride  0.9 % 25 mL IVPB, 200 mg, Intravenous, Q12H, de [place], [place] E, NP, Last Rate: 90 mL/hr at 12/18/23 2124, 200 mg at 12/18/23 2124   ondansetron  (ZOFRAN ) injection 4 mg, 4 mg, Intravenous, Q6H PRN, Howerter, Justin B, DO, 4 mg at 12/17/23 0132   pantoprazole  (PROTONIX ) injection 40 mg, 40 mg, Intravenous, Q12H, Tobie Modest GAILS, MD, 40 mg at 12/18/23 2208   potassium chloride  (KLOR-CON ) packet 40 mEq, 40 mEq, Oral,  Once, Leotis Bogus, MD   raloxifene  (EVISTA ) tablet 60 mg, 60 mg, Oral, Daily, Patel, Ekta V, MD, 60 mg at 12/18/23 1041   sodium chloride  flush (NS) 0.9 % injection 3 mL, 3 mL, Intravenous, Q12H, Tobie Mario GAILS, MD, 3 mL at 12/18/23 1041   venlafaxine  XR (EFFEXOR -XR) 24 hr capsule 150 mg, 150 mg, Oral, Q breakfast, Tobie Mario V, MD, 150 mg at 12/18/23 1040   ziprasidone  (GEODON ) injection 20 mg, 20 mg, Intramuscular, Once PRN, HowerterEva NOVAK, DO  Labs  and Diagnostic Imaging   CBC:  Recent Labs  Lab 12/16/23 1043 12/16/23 1203 12/18/23 0255 12/19/23 0137  WBC 14.3*  --  10.4 8.8  NEUTROABS 12.6*  --   --   --   HGB 12.4   < > 10.8* 9.9*  HCT 38.3   < > 33.5* 30.6*  MCV 83.8  --  84.4 84.8  PLT 505*  --  454* 378   < > = values in this interval not displayed.    Basic Metabolic Panel:  Lab Results  Component Value Date   NA 141 12/19/2023   K 3.4 (L) 12/19/2023   CO2 22 12/19/2023   GLUCOSE 116 (H) 12/19/2023   BUN 28 (H) 12/19/2023   CREATININE 0.97 12/19/2023   CALCIUM  8.5 (L) 12/19/2023   GFRNONAA 59 (L) 12/19/2023   GFRAA 53 (L) 06/30/2020   Lipid Panel:  Lab Results  Component Value Date   LDLCALC 50 12/17/2023   HgbA1c:  Lab Results  Component Value Date   HGBA1C 5.7 (H) 12/17/2023   Alcohol Level     Component Value Date/Time   ETH <15 12/16/2023 1040   INR  Lab Results  Component Value Date   INR 1.1 12/16/2023   APTT  Lab Results  Component Value Date   APTT <22 (L) 12/16/2023   UDS pending B12 235 Methylmalonic acid pending Ammonia 36 Folate greater than 20 HIV negative RPR nonreactive TSH 1.444 Beta hydroxybutyric acid 1.41 Urinalysis negative for UTI  MR Angio head without contrast and Carotid Duplex BL(Personally reviewed): Normal intracranial MRA. No large vessel occlusion or other vascular abnormality. No hemodynamically significant or correctable stenosis.  MRI Brain(Personally reviewed): Punctate 3 mm focus of restricted diffusion and mesial right temporal lobe/hippocampal formation  Continuous EEG: 12/17/2023 0951 to 12/18/2023 1030 - EEG was disconnected between 12/17/2023 2020 to 12/18/2023 0754  This study is within normal limits. No seizures or epileptiform discharges were seen throughout the recording. Event button was pressed on 12/18/2023 at 1026 without concomitant EEG change.  This was most likely not an epileptic event. A normal interictal EEG does not exclude the  diagnosis of epilepsy.  Assessment  [name] is a 78 y.o. female with history of anxiety, hypertension, hyperlipidemia and recent prior suspected seizure provoked in the setting of THC gummy use who presents again with altered mental status.  She initially reported that she has been having speech difficulties as well as balance difficulties for the past few days.  She was admitted in September with altered mental status in the setting of having taken a THC gummy to help with insomnia.  Patient reports that she has not used any more of these Gummies. [name] states that she was in her usual state of health until Friday, when she had several falls after suddenly losing her balance.  [name] states that she fell several times on Friday, Saturday and Sunday and began having difficulties with speech and  confusion during that time.  He reports that she did take a 10 mg THC gummy to help with sleep about 2 weeks ago. UDS is again positive for THC.  - [name], she is significantly improved, but still with occasional word salad and paraphasias when speaking rapidly. - LTM EEG report for yesterday morning: This study is within normal limits. No seizures or epileptiform discharges were seen throughout the recording. Event button was pressed on 12/18/2023 at 1026 without concomitant EEG change.  This was most likely not an epileptic event.  - She was found to have a B12 deficiency on previous admission, and cyanocobalamin  prescription was not filled.  B12 this admission is 235. Will supplement with IM cyanocobalamin .   - LTM EEG report for today: Intermittent slow, generalized. This study is suggestive of mild generalized cerebral dysfunction ( encephalopathy). No seizures or epileptiform discharges were seen throughout the recording. - Ammonia is elevated at 36 (last admission it was 38); do not suspect that this mild elevation is the source of her confusion. Other AMS labs have  been normal so far. Beta-hydroxybutyrate is elevated, but unsure of the significance of this as she is not hyperglycemic.   - Patient does have a punctate right hippocampal DWI abnormality on MRI, but this does not explain her altered mental status and aphasia.  - Impression: Primary component of the DDx was subclinical seizure activity as the etiology for her altered mental status and aphasia, however, LTM EEGs have been negative for seizures or epileptiform abnormalities. THC intoxication is now off the DDx given last use 2 weeks ago. She may have a delirium that occurred spontaneously and is now resolving; suspect possible underlying incipient dementia.   Recommendations  - Continue atorvastatin  40 mg daily - Aspirin 81 mg daily antiplt/anticoag - Tele - PT/OT/SLP - Repeat MRI to assess for possible left MCA stroke not detectable on initial MRI (ordered) - Discontinuing LTM EEG - Discussed with patient and family the option of tapering off Vimpat  as description of the event by relative that occurred prior to her first admission sounds more like agitated movements in the setting of a delirium at that time, rather than a seizure. Patient requests that Vimpat  be tapered off in this context and family is in agreement. Will lower dose to 100 mg BID with goal of gradual further tapering off of this medication over the next 4 weeks.  - Cyanocobalamin  injection 1000 mcg today x 1 then weekly x 4 weeks, followed by daily oral supplementation - Patient agrees to completely cease use of all cannabis products including gummies. Recommend family locates the bottle and discard safely. - Trazodone  100 mg at bedtime for insomnia (ordered) - Discontinuing Geodon   Patient seen and examined by NP/APP with MD.    Jorene Last, DNP, FNP-BC [facility] Pager: [phone]  Electronically signed: Dr. Marckus Hanover

## 2023-12-20 DIAGNOSIS — Z72821 Inadequate sleep hygiene: Secondary | ICD-10-CM

## 2023-12-20 DIAGNOSIS — F0390 Unspecified dementia without behavioral disturbance: Secondary | ICD-10-CM | POA: Diagnosis not present

## 2023-12-20 DIAGNOSIS — R410 Disorientation, unspecified: Secondary | ICD-10-CM | POA: Diagnosis not present

## 2023-12-20 DIAGNOSIS — R42 Dizziness and giddiness: Secondary | ICD-10-CM | POA: Diagnosis not present

## 2023-12-20 LAB — METHYLMALONIC ACID, SERUM: Methylmalonic Acid, Quantitative: 164 nmol/L (ref 0–378)

## 2023-12-20 MED ORDER — ACETAMINOPHEN 325 MG PO TABS
650.0000 mg | ORAL_TABLET | Freq: Four times a day (QID) | ORAL | Status: DC | PRN
Start: 2023-12-20 — End: 2023-12-23
  Administered 2023-12-20: 650 mg via ORAL
  Filled 2023-12-20 (×2): qty 2

## 2023-12-20 MED ORDER — METHOCARBAMOL 500 MG PO TABS
500.0000 mg | ORAL_TABLET | Freq: Three times a day (TID) | ORAL | Status: DC | PRN
Start: 2023-12-20 — End: 2023-12-23
  Administered 2023-12-20 – 2023-12-22 (×4): 500 mg via ORAL
  Filled 2023-12-20 (×4): qty 1

## 2023-12-20 MED ORDER — ACETAMINOPHEN 325 MG PO TABS
650.0000 mg | ORAL_TABLET | Freq: Four times a day (QID) | ORAL | Status: DC
  Administered 2023-12-20 – 2023-12-23 (×11): 650 mg via ORAL
  Filled 2023-12-20 (×10): qty 2

## 2023-12-20 MED ORDER — SODIUM CHLORIDE 0.9 % IV SOLN: 100.0000 mg | Freq: Two times a day (BID) | INTRAVENOUS | Status: DC

## 2023-12-20 NOTE — Progress Notes (Signed)
 NEUROLOGY CONSULT FOLLOW UP NOTE   Date of service: December 20, 2023 Patient Name: [name] MRN:  [id number] DOB:  [birth year]  Interval Hx/subjective  Family states that she is significantly better today in terms of language, essentially back to her baseline, after a very restful night of sleep after receiving Robaxin at about 2 AM for muscle aches related to her fall injuries. She also received Trazodone  100 mg at 10 PM, which did not help her sleep initially due to the muscle aches, but may have helped after the Robaxin was given.   Vitals   Vitals:   12/19/23 2119 12/20/23 0007 12/20/23 0752 12/20/23 1132  BP: (!) 148/64 (!) 153/67 (!) 146/62 (!) 143/73  Pulse: 89 83 93 96  Resp: 18 17 19 18   Temp: 97.6 F (36.4 C) 98.2 F (36.8 C) 97.8 F (36.6 C) 97.9 F (36.6 C)  TempSrc: Oral Oral Oral Oral  SpO2: 99% 90% 96% 99%  Weight:      Height:         Body mass index is 34.15 kg/m.  Physical Exam   Constitutional: Appears well-developed and well-nourished.  Psych: Bright affect and more alert today. Thought content more linear. Non-tangential.  Eyes: No scleral injection.  HENT: No OP obstrucion.  Head: Normocephalic.  Cardiovascular: Normal rate and regular rhythm.  Respiratory: Effort normal, non-labored breathing.  Skin: WDI.   Neurologic Examination   Mental Status: Patient is awake and conversing with family when neurologist enters the room. Alert and oriented x 5. Fully oriented to circumstance. Speech is fluent without dysarthria. Comprehension intact to all questions and commands. Has some trouble hearing at times, even with hearing aids in. No inappropriate word choices and no paraphasias. Naming intact. Able to recite the months of the year forwards and backwards normally and without difficulty.  Cranial Nerves:  II: PERRL.   III, IV, VI: Extraocular movements intact V: Sensation is intact to light touch and symmetrical to face.  VII: Smile is  symmetrical.  VIII: HOH which improves with hearing aids in place.  IX, X: Phonation is normal.  XII: No lingual dysarthria.  Motor: Able to move all 4 extremities symmetrically with antigravity strength and resists 5/5 x 4 Sensation- Intact to light touch bilaterally x 4  Coordination: FTN intact bilaterally Gait- Deferred  Medications  Current Facility-Administered Medications:    acetaminophen  (TYLENOL ) tablet 650 mg, 650 mg, Oral, Q6H PRN, Rathore, Vasundhra, MD, 650 mg at 12/20/23 0506   acetaminophen  (TYLENOL ) tablet 650 mg, 650 mg, Oral, Q6H, Khatri, Pardeep, MD, 650 mg at 12/20/23 1717   amLODipine  (NORVASC ) tablet 5 mg, 5 mg, Oral, Daily, Leotis, Pardeep, MD, 5 mg at 12/20/23 0949   aspirin EC tablet 81 mg, 81 mg, Oral, Daily, Tobie Modest V, MD, 81 mg at 12/20/23 0949   atorvastatin  (LIPITOR) tablet 40 mg, 40 mg, Oral, Daily, Patel, Ekta V, MD, 40 mg at 12/20/23 0949   benazepril  (LOTENSIN ) tablet 20 mg, 20 mg, Oral, Daily, Khatri, Pardeep, MD, 20 mg at 12/20/23 0949   heparin injection 5,000 Units, 5,000 Units, Subcutaneous, Q8H, Tobie Modest V, MD, 5,000 Units at 12/20/23 1350   lacosamide  (VIMPAT ) 100 mg in sodium chloride  0.9 % 25 mL IVPB, 100 mg, Intravenous, Q12H, Samia Kukla, MD, Last Rate: 70 mL/hr at 12/20/23 0957, 100 mg at 12/20/23 0957   methocarbamol (ROBAXIN) tablet 500 mg, 500 mg, Oral, Q8H PRN, Leotis, Pardeep, MD, 500 mg at 12/20/23 1255   ondansetron  (ZOFRAN ) injection  4 mg, 4 mg, Intravenous, Q6H PRN, Howerter, Justin B, DO, 4 mg at 12/17/23 0132   pantoprazole  (PROTONIX ) EC tablet 40 mg, 40 mg, Oral, BID, Chen, Lydia D, RPH, 40 mg at 12/20/23 9050   potassium chloride  (KLOR-CON ) packet 40 mEq, 40 mEq, Oral, Once, Leotis Bogus, MD   raloxifene  (EVISTA ) tablet 60 mg, 60 mg, Oral, Daily, Tobie Modest V, MD, 60 mg at 12/20/23 0949   sodium chloride  flush (NS) 0.9 % injection 3 mL, 3 mL, Intravenous, Q12H, Tobie Modest V, MD, 3 mL at 12/20/23 0949   traZODone   (DESYREL ) tablet 100 mg, 100 mg, Oral, QHS, Kylan Veach, MD, 100 mg at 12/19/23 2105   venlafaxine  XR (EFFEXOR -XR) 24 hr capsule 150 mg, 150 mg, Oral, Q breakfast, Tobie Modest V, MD, 150 mg at 12/20/23 0837  Labs and Diagnostic Imaging   CBC:  Recent Labs  Lab 12/16/23 1043 12/16/23 1203 12/18/23 0255 12/19/23 0137  WBC 14.3*  --  10.4 8.8  NEUTROABS 12.6*  --   --   --   HGB 12.4   < > 10.8* 9.9*  HCT 38.3   < > 33.5* 30.6*  MCV 83.8  --  84.4 84.8  PLT 505*  --  454* 378   < > = values in this interval not displayed.    Basic Metabolic Panel:  Lab Results  Component Value Date   NA 141 12/19/2023   K 3.4 (L) 12/19/2023   CO2 22 12/19/2023   GLUCOSE 116 (H) 12/19/2023   BUN 28 (H) 12/19/2023   CREATININE 0.97 12/19/2023   CALCIUM  8.5 (L) 12/19/2023   GFRNONAA 59 (L) 12/19/2023   GFRAA 53 (L) 06/30/2020   Lipid Panel:  Lab Results  Component Value Date   LDLCALC 50 12/17/2023   HgbA1c:  Lab Results  Component Value Date   HGBA1C 5.7 (H) 12/17/2023   Urine Drug Screen:     Component Value Date/Time   LABOPIA NONE DETECTED 12/19/2023 0350   COCAINSCRNUR NONE DETECTED 12/19/2023 0350   LABBENZ POSITIVE (A) 12/19/2023 0350   AMPHETMU NONE DETECTED 12/19/2023 0350   THCU POSITIVE (A) 12/19/2023 0350   LABBARB NONE DETECTED 12/19/2023 0350    Alcohol Level     Component Value Date/Time   ETH <15 12/16/2023 1040   INR  Lab Results  Component Value Date   INR 1.1 12/16/2023   APTT  Lab Results  Component Value Date   APTT <22 (L) 12/16/2023     Assessment  [name] is a 78 y.o. female with history of anxiety, hypertension, hyperlipidemia and recent prior suspected seizure provoked in the setting of THC gummy use who presents again with altered mental status.  She initially reported that she has been having speech difficulties as well as balance difficulties for the past few days.  She was admitted in September with altered mental status in  the setting of having taken a THC gummy to help with insomnia.  Patient reports that she has not used any more of these Gummies. [name] states that she was in her usual state of health until Friday, when she had several falls after suddenly losing her balance.  [name] states that she fell several times on Friday, Saturday and Sunday and began having difficulties with speech and confusion during that time.  He reports that she did take a 10 mg THC gummy to help with sleep about 2 weeks ago. UDS is again positive for THC.  -  [name], she is back to baseline. - EEGs: - LTM EEG report for Tuesday morning: This study is within normal limits. No seizures or epileptiform discharges were seen throughout the recording. Event button was pressed on 12/18/2023 at 1026 without concomitant EEG change.  This was most likely not an epileptic event.  - LTM EEG report for Wednesday: Intermittent slow, generalized. This study is suggestive of mild generalized cerebral dysfunction ( encephalopathy). No seizures or epileptiform discharges were seen throughout the recording. - She was found to have a B12 deficiency on previous admission, and cyanocobalamin  prescription was not filled.  B12 this admission is 235. Will supplement with IM cyanocobalamin . - Patient does have a punctate right hippocampal DWI abnormality on MRI, but this does not explain her altered mental status and aphasia.  - Impression: Primary component of the DDx was subclinical seizure activity as the etiology for her altered mental status and aphasia, however, LTM EEGs were negative for seizures or epileptiform abnormalities and seizure is lower on the DDx. The higher dose of lacosamide  this admission may have slowed her recovery; she is doing better now that she is on 100 mg BID. THC intoxication is now off the DDx given last use 2 weeks ago. She may have had a delirium that occurred spontaneously and is now resolved. Suspect possible  underlying incipient dementia making her susceptible to delirium secondary to relatively mild insults. Poor sleep hygiene/insomnia may also have played a role as she is significantly improved today, back to baseline, after sleeping for about 12 hours from 2 AM to 2 PM.   Recommendations  - Continue atorvastatin  40 mg daily - Aspirin 81 mg daily antiplt/anticoag - Tele - PT/OT/SLP - No need for repeat MRI - Discussed with patient and family the option of tapering off Vimpat  as description of the event by relative that occurred prior to her first admission sounds more like agitated movements in the setting of a delirium at that time, rather than a seizure. Patient requests that Vimpat  be tapered off in this context and family is in agreement. Will lower dose to 100 mg BID with goal of gradual further tapering off of this medication over the next 4 weeks. This should be managed outpatient by her Neurologist.  - Cyanocobalamin  injection 1000 mcg today x 1 then weekly x 4 weeks, followed by daily oral supplementation - Patient agrees to completely cease use of all cannabis products including gummies. Recommend family locates the bottle and discard safely. - Continue trazodone  100 mg at bedtime for insomnia - Continue Robaxin PRN for muscle pain. - Discontinued Geodon  yesterday. Avoid neuroleptics in this patient.   - Encourage oral hydration towards the goal of normalizing her elevated BUN/Cr ratio.  - Neurohospitalist service will sign off. Please call if there are additional questions.  ______________________________________________________________________   Bonney SHARK, Trayveon Beckford, MD [facility]

## 2023-12-20 NOTE — Progress Notes (Signed)
 Physical Therapy Treatment Patient Details Name: [name] MRN: [id number] DOB: 1945-11-10 Today's Date: 12/20/2023   History of Present Illness Pt is a 78 y.o. F who presents 12/16/2023 with multiple falls from feeling dizzy, slurred speech, and SOB. Hit head during fall on 10/31. MR showed punctate 3 mm focus of restricted diffusion involving the mesial right  temporal lobe/hippocampal formation. Primary  differential considerations include a punctate acute ischemic nonhemorrhagic  infarct versus changes of transient global amnesia. Performing EEG to rule out seizure. Was recently admitted 10/2023 for seizure-like activity. MRI with no evidence of acute intracranial abnormality. CT chest 11/5 revealed acute non-displaced L 4-5 lateral rib fractures. PMH positive for HTN, GERD, MDD, GAD, insomina, CVA.    PT Comments  Patient a&ox4. She agreed to limited therapy due to her stomachache. Once sitting EOB, she refused to stand due to fear of having diarrhea. She agreed to do sitting exercises (see below). States she feels better after some mobility and hopes to feel well enough to walk tomorrow.     If plan is discharge home, recommend the following: A lot of help with walking and/or transfers;A lot of help with bathing/dressing/bathroom;Assistance with cooking/housework;Assistance with feeding;Direct supervision/assist for medications management;Direct supervision/assist for financial management;Assist for transportation;Help with stairs or ramp for entrance;Supervision due to cognitive status   Can travel by private vehicle        Equipment Recommendations  Other (comment) (TBA)    Recommendations for Other Services       Precautions / Restrictions Precautions Precautions: Fall Recall of Precautions/Restrictions: Intact Precaution/Restrictions Comments: monitor HR Restrictions Weight Bearing Restrictions Per Provider Order: No     Mobility  Bed Mobility Overal bed mobility:  Needs Assistance Bed Mobility: Sit to Supine, Supine to Sit     Supine to sit: Contact guard Sit to supine: Contact guard assist   General bed mobility comments: HOB flat and no rail; slight incr time/effort    Transfers                   General transfer comment: pt refused stating she had diarrhea this morning and her stomach doesn't feel well. She is afraid she will have diarrhea again if she stands.    Ambulation/Gait                   Stairs             Wheelchair Mobility     Tilt Bed    Modified Rankin (Stroke Patients Only)       Balance Overall balance assessment: Needs assistance Sitting-balance support: No upper extremity supported, Feet supported Sitting balance-Leahy Scale: Fair Sitting balance - Comments: seated EOB                                    Communication Communication Communication: Impaired Factors Affecting Communication: Hearing impaired  Cognition Arousal: Alert Behavior During Therapy: Anxious   PT - Cognitive impairments: Memory, Attention, Problem solving, Safety/Judgement                       PT - Cognition Comments: a&o x4 (knew she had fallen PTA) Following commands: Impaired Following commands impaired: Follows one step commands inconsistently, Follows one step commands with increased time    Cueing Cueing Techniques: Verbal cues, Gestural cues  Exercises General Exercises - Lower Extremity Long Arc Quad: AROM, Both, 10  reps, Seated Toe Raises: AROM, Both, 10 reps, Seated Heel Raises: AROM, Both, 10 reps    General Comments        Pertinent Vitals/Pain Pain Assessment Pain Assessment: Faces Faces Pain Scale: Hurts a little bit Pain Location: abdomen Pain Descriptors / Indicators: Discomfort Pain Intervention(s): Limited activity within patient's tolerance, Monitored during session    Home Living                          Prior Function            PT  Goals (current goals can now be found in the care plan section) Acute Rehab PT Goals Patient Stated Goal: Return home Time For Goal Achievement: 01/01/24 Potential to Achieve Goals: Good Progress towards PT goals: Not progressing toward goals - comment    Frequency    Min 3X/week      PT Plan      Co-evaluation              AM-PAC PT 6 Clicks Mobility   Outcome Measure  Help needed turning from your back to your side while in a flat bed without using bedrails?: A Little Help needed moving from lying on your back to sitting on the side of a flat bed without using bedrails?: A Little Help needed moving to and from a bed to a chair (including a wheelchair)?: Total Help needed standing up from a chair using your arms (e.g., wheelchair or bedside chair)?: Total Help needed to walk in hospital room?: Total Help needed climbing 3-5 steps with a railing? : Total 6 Click Score: 10    End of Session   Activity Tolerance: Treatment limited secondary to medical complications (Comment) (abd discomfort; concerned ?diarrhea) Patient left: in bed;with call bell/phone within reach;with family/visitor present;with bed alarm set Nurse Communication: Mobility status;Patient requests pain meds PT Visit Diagnosis: Unsteadiness on feet (R26.81);History of falling (Z91.81);Muscle weakness (generalized) (M62.81)     Time: 8546-8495 PT Time Calculation (min) (ACUTE ONLY): 11 min  Charges:    $Therapeutic Activity: 8-22 mins PT General Charges $$ ACUTE PT VISIT: 1 Visit                      Macario RAMAN, PT Acute Rehabilitation Services  Office [phone]    Macario SHAUNNA Soja 12/20/2023, 3:16 PM

## 2023-12-20 NOTE — Progress Notes (Signed)
 Occupational Therapy Treatment Patient Details Name: [name] MRN: [id number] DOB: 08-Apr-1945 Today's Date: 12/20/2023   History of present illness Pt is a 78 y.o. F who presents 12/16/2023 with multiple falls from feeling dizzy, slurred speech, and SOB. Hit head during fall on 10/31. MR showed punctate 3 mm focus of restricted diffusion involving the mesial right  temporal lobe/hippocampal formation. Primary  differential considerations include a punctate acute ischemic nonhemorrhagic  infarct versus changes of transient global amnesia. Performing EEG to rule out seizure. Was recently admitted 10/2023 for seizure-like activity. MRI with no evidence of acute intracranial abnormality. CT chest 11/5 revealed acute non-displaced L 4-5 lateral rib fractures. PMH positive for HTN, GERD, MDD, GAD, insomina, CVA.   OT comments  Pt received seated on BSC, bed alarm going off. Pt requiring assist for hygiene and return to bed. She needed min A for posterior peri care following BM and setup with cognitive cues for simple seated hand washing. Min A for stepping BSC>bed via RW, cues to maintain appropriate proximity to AD. Pt with reduced shakiness this date, still forgetful and with impaired attention/problem solving skills. Remained tachycardic with rates in 120s, SpO2 98% on RA. Pt is progressing well towards functional goals, OT to continue to follow.      If plan is discharge home, recommend the following:  A lot of help with bathing/dressing/bathroom;Assistance with cooking/housework;Direct supervision/assist for medications management;Direct supervision/assist for financial management;Assist for transportation;Help with stairs or ramp for entrance;Supervision due to cognitive status;A little help with walking and/or transfers   Equipment Recommendations  Other (comment) (defer)    Recommendations for Other Services      Precautions / Restrictions Precautions Precautions: Fall Recall of  Precautions/Restrictions: Impaired Precaution/Restrictions Comments: monitor HR Restrictions Weight Bearing Restrictions Per Provider Order: No       Mobility Bed Mobility Overal bed mobility: Needs Assistance Bed Mobility: Sit to Supine       Sit to supine: Min assist, HOB elevated, Used rails   General bed mobility comments: assist for LE mgmt sit>supine    Transfers Overall transfer level: Needs assistance Equipment used: Rolling walker (2 wheels) Transfers: Sit to/from Stand, Bed to chair/wheelchair/BSC       Step pivot transfers: Min assist     General transfer comment: Cued for proper use/sequencing use of RW to approach bed safely.     Balance Overall balance assessment: Needs assistance Sitting-balance support: No upper extremity supported, Feet supported Sitting balance-Leahy Scale: Fair Sitting balance - Comments: seated EOB   Standing balance support: Bilateral upper extremity supported, During functional activity, Reliant on assistive device for balance Standing balance-Leahy Scale: Poor Standing balance comment: min A for stepping to bed with cues to maintain appropriate proximity to AD                           ADL either performed or assessed with clinical judgement   ADL Overall ADL's : Needs assistance/impaired     Grooming: Set up;Bed level;Wash/dry hands Grooming Details (indicate cue type and reason): wiping hands with soapy washcloth, needs cues to sequence exchanging wet for dry clean washcloth                 Toilet Transfer: Contact guard assist;Stand-pivot;BSC/3in1;Rolling walker (2 wheels) Toilet Transfer Details (indicate cue type and reason): cued for hand placement and safe transfer technique Toileting- Clothing Manipulation and Hygiene: Minimal assistance;Sitting/lateral lean Toileting - Clothing Manipulation Details (indicate cue type and  reason): assist for thoroughness following BM     Functional mobility  during ADLs: Minimal assistance;Rolling walker (2 wheels)      Extremity/Trunk Assessment              Vision       Perception     Praxis     Communication Communication Communication: Impaired Factors Affecting Communication:  (verbose)   Cognition Arousal: Alert Behavior During Therapy: Flat affect Cognition: Cognition impaired   Orientation impairments:  (not formally assessed this date) Awareness: Intellectual awareness impaired Memory impairment (select all impairments): Short-term memory, Working memory Attention impairment (select first level of impairment): Sustained attention Executive functioning impairment (select all impairments): Organization, Sequencing, Reasoning, Problem solving OT - Cognition Comments: forgetful, perseverative on random thoughts throughout session, redirectable and pleasant                 Following commands: Impaired Following commands impaired: Follows one step commands inconsistently, Follows one step commands with increased time      Cueing   Cueing Techniques: Verbal cues, Gestural cues  Exercises      Shoulder Instructions       General Comments supportive husband at bedside    Pertinent Vitals/ Pain       Pain Assessment Pain Assessment: Faces Faces Pain Scale: Hurts little more Pain Location: generalized Pain Descriptors / Indicators: Discomfort Pain Intervention(s): Monitored during session, Repositioned  Home Living                                          Prior Functioning/Environment              Frequency  Min 2X/week        Progress Toward Goals  OT Goals(current goals can now be found in the care plan section)  Progress towards OT goals: Progressing toward goals     Plan      Co-evaluation                 AM-PAC OT 6 Clicks Daily Activity     Outcome Measure   Help from another person eating meals?: A Little Help from another person taking care of  personal grooming?: A Little Help from another person toileting, which includes using toliet, bedpan, or urinal?: A Little Help from another person bathing (including washing, rinsing, drying)?: A Lot Help from another person to put on and taking off regular upper body clothing?: A Little Help from another person to put on and taking off regular lower body clothing?: A Lot 6 Click Score: 16    End of Session Equipment Utilized During Treatment: Rolling walker (2 wheels)  OT Visit Diagnosis: Unsteadiness on feet (R26.81);History of falling (Z91.81);Muscle weakness (generalized) (M62.81);Cognitive communication deficit (R41.841) Symptoms and signs involving cognitive functions: Other cerebrovascular disease   Activity Tolerance Patient tolerated treatment well   Patient Left in bed;with call bell/phone within reach;with bed alarm set;with family/visitor present;with SCD's reapplied   Nurse Communication Mobility status        Time: 8963-8952 OT Time Calculation (min): 11 min  Charges: OT General Charges $OT Visit: 1 Visit OT Treatments $Self Care/Home Management : 8-22 mins  Isiah Scheel D., MSOT, OTR/L Acute Rehabilitation Services [phone] Secure Chat Preferred  Rikki Milch 12/20/2023, 11:13 AM

## 2023-12-20 NOTE — Progress Notes (Signed)
 PROGRESS NOTE    [name]  FMW:[id number] DOB: 11/08/1945 DOA: 12/16/2023  PCP: Wendolyn Jenkins Jansky, MD   Brief Narrative:  This 78 yrs old female with PMH significant for hypertension, hyperlipidemia, anxiety, previous history of stroke presented in the ED with altered mental status.  Patient reports that she has been having difficulty with her speech over the last couple of days as well as difficulty with her balance.  On her prior hospitalization she was monitored on EEG which did not demonstrate any seizure activity.  Patient was started on Vimpat  50 mg twice daily, it is uncertain if she is taking medicine.  LP was attempted but was unsuccessful even with fluoroscopy guidance.  Encephalopathy has  improved and She was discharged home. Workup in the ED reveals CT head negative for acute intracranial abnormality.  Patient was admitted for further evaluation.  Neurology is consulted.  Assessment & Plan:   Principal Problem:   Dizziness Active Problems:   Essential hypertension  Dizziness: Patient presented with dizziness and subsequent fall likely due to dehydration and hypovolemia. Continued on  IV fluid resuscitation, Continue fall precautions. PT/ OT > recommended acute inpatient rehab but family prefers home health services. She reports slight improvement in dizziness.  Dysarthria: MRI concerning for stroke. Neurology consulted recommended stroke workup. Continued on EEG monitoring. MRA > No large vessel occlusion or other vascular abnormality. Vit B12 235, Ammonia level 36, hemoglobin A1c 5.7. May consider repeating LP if no improvement. EEG suggestive of mild generalized cerebral dysfunction, no seizure or epileptic discharges were seen. Echo shows LVEF 65 to 70%, left ventricle has normal function.  No RWMA.  Neurologist recommended repeat MRI which was also negative for stroke. Continue aspirin 81 mg, Lipitor 40 mg daily. Discussed with family the option of  tapering of Vimpat . Patient will also discontinue use of all cannabis products including Gummies.  Left sided rib fractures: Mildly displaced left 4th and 5th lateral rib fractures likely as a result of fall. Continue adequate pain control.  Muscle relaxers.  Anion gap metabolic acidosis: Likely due to lactic acidosis which is resolved with IV hydration.  Chronic diarrhea: Patient has a GI appointment that is upcoming. This could be Infectious or GIB .   Less likely inflammatory or microscopic colitis or malignancy. Follow up outpatient.  Diarrhea improving.   Hypokalemia: Replaced.Continue to monitor   Acute kidney injury: Baseline serum creatinine normal.  Presented with creatinine of 2.04 Serum creatinine improved with IV hydration.  AKI resolved.  Generalized anxiety disorder / depression: Continue venlafaxine .   Seizure disorder: Continued on  Vimpat .   Continue seizure precautions,  aspiration and fall precautions.     DVT prophylaxis: Heparin sq Code Status:Full code Family Communication: Family at bed side. Disposition Plan:   Status is: Inpatient Remains inpatient appropriate because:      Admitted for acute encephalopathy likely multifactorial.  Workup is in process , Neurology is consulted.  EEG discontinued. Patient is medically clear , awaiting CIR placement.  Consultants:  Neurology  Procedures: CT Head. MRI  brain  Antimicrobials:  Anti-infectives (From admission, onward)    None      Subjective: Patient was seen and examined at bedside.  Overnight events noted. Patient reports feeling better.  MRI and CTA chest negative for PE. EEG is discontinued.  She complains of having left-sided chest pain.  Objective: Vitals:   12/19/23 2119 12/20/23 0007 12/20/23 0752 12/20/23 1132  BP: (!) 148/64 (!) 153/67 (!) 146/62 (!) 143/73  Pulse: 89 83 93 96  Resp: 18 17 19 18   Temp: 97.6 F (36.4 C) 98.2 F (36.8 C) 97.8 F (36.6 C) 97.9 F (36.6 C)   TempSrc: Oral Oral Oral Oral  SpO2: 99% 90% 96% 99%  Weight:      Height:       No intake or output data in the 24 hours ending 12/20/23 1232  Filed Weights   12/16/23 1038 12/18/23 0500 12/19/23 0413  Weight: 81.6 kg 82.8 kg 84.7 kg    Examination:  General exam: Appears calm and comfortable, not in any acute distress. Respiratory system: CTA Bilaterally.  Respiratory effort normal.  RR 14 Cardiovascular system: S1 & S2 heard, RRR. No JVD, murmurs, rubs, gallops or clicks. No pedal edema. Gastrointestinal system: Abdomen is non distended, soft and non tender. Normal bowel sounds heard. Central nervous system: Alert and oriented x 2. No focal neurological deficits. Extremities: No edema, no cyanosis, no clubbing. Skin: No rashes, lesions or ulcers Psychiatry: Judgement and insight appear normal. Mood & affect appropriate.     Data Reviewed: I have personally reviewed following labs and imaging studies  CBC: Recent Labs  Lab 12/16/23 1043 12/16/23 1203 12/16/23 2112 12/18/23 0255 12/19/23 0137  WBC 14.3*  --   --  10.4 8.8  NEUTROABS 12.6*  --   --   --   --   HGB 12.4 13.3 10.2* 10.8* 9.9*  HCT 38.3 39.0 31.5* 33.5* 30.6*  MCV 83.8  --   --  84.4 84.8  PLT 505*  --   --  454* 378   Basic Metabolic Panel: Recent Labs  Lab 12/16/23 1043 12/16/23 1203 12/16/23 1658 12/16/23 2027 12/17/23 0602 12/18/23 0255 12/19/23 0137  NA 134* 133*  --  135 138 140 141  K 3.4* 3.2*  --  3.0* 3.4* 3.3* 3.4*  CL 95* 101  --  100 102 105 105  CO2 15*  --   --  19* 22 20* 22  GLUCOSE 133* 128*  --  105* 121* 133* 116*  BUN 22 23  --  24* 22 24* 28*  CREATININE 2.04* 2.10*  --  1.75* 1.37* 1.15* 0.97  CALCIUM  9.3  --   --  8.4* 8.5* 8.6* 8.5*  MG  --   --  2.1 1.9  --  2.0 1.9  PHOS  --   --   --   --   --  3.1 2.5   GFR: Estimated Creatinine Clearance: 47.4 mL/min (by C-G formula based on SCr of 0.97 mg/dL). Liver Function Tests: Recent Labs  Lab 12/16/23 1043  AST 33   ALT 25  ALKPHOS 103  BILITOT 1.1  PROT 7.5  ALBUMIN 4.1   No results for input(s): LIPASE, AMYLASE in the last 168 hours. Recent Labs  Lab 12/17/23 0602  AMMONIA 36*   Coagulation Profile: Recent Labs  Lab 12/16/23 1043  INR 1.1   Cardiac Enzymes: Recent Labs  Lab 12/16/23 1658  CKTOTAL 230   BNP (last 3 results) No results for input(s): PROBNP in the last 8760 hours. HbA1C: No results for input(s): HGBA1C in the last 72 hours.  CBG: Recent Labs  Lab 12/17/23 0644 12/18/23 0645 12/18/23 1147 12/18/23 1626 12/19/23 0610  GLUCAP 115* 90 114* 117* 94   Lipid Profile: No results for input(s): CHOL, HDL, LDLCALC, TRIG, CHOLHDL, LDLDIRECT in the last 72 hours.  Thyroid  Function Tests: No results for input(s): TSH, T4TOTAL, FREET4, T3FREE, THYROIDAB in the last  72 hours.  Anemia Panel: No results for input(s): VITAMINB12, FOLATE, FERRITIN, TIBC, IRON, RETICCTPCT in the last 72 hours.  Sepsis Labs: Recent Labs  Lab 12/16/23 1434 12/16/23 1700 12/16/23 2032 12/16/23 2225  LATICACIDVEN 2.0* 3.1* 1.2 1.0    Recent Results (from the past 240 hours)  Blood culture (routine x 2)     Status: Abnormal   Collection Time: 12/16/23  8:25 PM   Specimen: BLOOD  Result Value Ref Range Status   Specimen Description BLOOD LEFT ANTECUBITAL  Final   Special Requests   Final    BOTTLES DRAWN AEROBIC AND ANAEROBIC Blood Culture results may not be optimal due to an inadequate volume of blood received in culture bottles   Culture  Setup Time   Final    GRAM POSITIVE COCCI IN BOTH AEROBIC AND ANAEROBIC BOTTLES CRITICAL RESULT CALLED TO, READ BACK BY AND VERIFIED WITH: PHARMD C. AMEND [id number] AT 1650, ADC Performed at [facility], [address] [place], [place] [address]    Culture STAPHYLOCOCCUS EPIDERMIDIS (A)  Final   Report Status 12/19/2023 FINAL  Final   Organism ID, Bacteria STAPHYLOCOCCUS EPIDERMIDIS  Final       Susceptibility   Staphylococcus epidermidis - MIC*    CIPROFLOXACIN <=0.5 SENSITIVE Sensitive     ERYTHROMYCIN >=8 RESISTANT Resistant     GENTAMICIN <=0.5 SENSITIVE Sensitive     OXACILLIN >=4 RESISTANT Resistant     TETRACYCLINE >=16 RESISTANT Resistant     VANCOMYCIN  2 SENSITIVE Sensitive     TRIMETH/SULFA 160 RESISTANT Resistant     CLINDAMYCIN <=0.25 SENSITIVE Sensitive     RIFAMPIN <=0.5 SENSITIVE Sensitive     Inducible Clindamycin NEGATIVE Sensitive     * STAPHYLOCOCCUS EPIDERMIDIS  Blood Culture ID Panel (Reflexed)     Status: Abnormal   Collection Time: 12/16/23  8:25 PM  Result Value Ref Range Status   Enterococcus faecalis NOT DETECTED NOT DETECTED Final   Enterococcus Faecium NOT DETECTED NOT DETECTED Final   Listeria monocytogenes NOT DETECTED NOT DETECTED Final   Staphylococcus species DETECTED (A) NOT DETECTED Final    Comment: CRITICAL RESULT CALLED TO, READ BACK BY AND VERIFIED WITH: PHARMD C. AMEND [id number] AT 1650, ADC    Staphylococcus aureus (BCID) NOT DETECTED NOT DETECTED Final   Staphylococcus epidermidis DETECTED (A) NOT DETECTED Final    Comment: Methicillin (oxacillin) resistant coagulase negative staphylococcus. Possible blood culture contaminant (unless isolated from more than one blood culture draw or clinical case suggests pathogenicity). No antibiotic treatment is indicated for blood  culture contaminants. CRITICAL RESULT CALLED TO, READ BACK BY AND VERIFIED WITH: PHARMD C. AMEND [id number] AT 1650, ADC    Staphylococcus lugdunensis NOT DETECTED NOT DETECTED Final   Streptococcus species NOT DETECTED NOT DETECTED Final   Streptococcus agalactiae NOT DETECTED NOT DETECTED Final   Streptococcus pneumoniae NOT DETECTED NOT DETECTED Final   Streptococcus pyogenes NOT DETECTED NOT DETECTED Final   A.calcoaceticus-baumannii NOT DETECTED NOT DETECTED Final   Bacteroides fragilis NOT DETECTED NOT DETECTED Final   Enterobacterales NOT DETECTED NOT DETECTED Final    Enterobacter cloacae complex NOT DETECTED NOT DETECTED Final   Escherichia coli NOT DETECTED NOT DETECTED Final   Klebsiella aerogenes NOT DETECTED NOT DETECTED Final   Klebsiella oxytoca NOT DETECTED NOT DETECTED Final   Klebsiella pneumoniae NOT DETECTED NOT DETECTED Final   Proteus species NOT DETECTED NOT DETECTED Final   Salmonella species NOT DETECTED NOT DETECTED Final   Serratia marcescens NOT DETECTED NOT  DETECTED Final   Haemophilus influenzae NOT DETECTED NOT DETECTED Final   Neisseria meningitidis NOT DETECTED NOT DETECTED Final   Pseudomonas aeruginosa NOT DETECTED NOT DETECTED Final   Stenotrophomonas maltophilia NOT DETECTED NOT DETECTED Final   Candida albicans NOT DETECTED NOT DETECTED Final   Candida auris NOT DETECTED NOT DETECTED Final   Candida glabrata NOT DETECTED NOT DETECTED Final   Candida krusei NOT DETECTED NOT DETECTED Final   Candida parapsilosis NOT DETECTED NOT DETECTED Final   Candida tropicalis NOT DETECTED NOT DETECTED Final   Cryptococcus neoformans/gattii NOT DETECTED NOT DETECTED Final   Methicillin resistance mecA/C DETECTED (A) NOT DETECTED Final    Comment: CRITICAL RESULT CALLED TO, READ BACK BY AND VERIFIED WITH: PHARMD C. AMEND [id number] AT 1650, ADC Performed at [facility], [address], [place], [place] [address]   Blood culture (routine x 2)     Status: Abnormal   Collection Time: 12/16/23  8:30 PM   Specimen: BLOOD LEFT FOREARM  Result Value Ref Range Status   Specimen Description BLOOD LEFT FOREARM  Final   Special Requests   Final    BOTTLES DRAWN AEROBIC AND ANAEROBIC Blood Culture results may not be optimal due to an inadequate volume of blood received in culture bottles   Culture  Setup Time   Final    GRAM POSITIVE COCCI IN CLUSTERS AEROBIC BOTTLE ONLY CRITICAL VALUE NOTED.  VALUE IS CONSISTENT WITH PREVIOUSLY REPORTED AND CALLED VALUE.    Culture (A)  Final    STAPHYLOCOCCUS EPIDERMIDIS SUSCEPTIBILITIES PERFORMED  ON PREVIOUS CULTURE WITHIN THE LAST 5 DAYS. Performed at [facility], [address], [place], [place] [address]    Report Status 12/19/2023 FINAL  Final    Radiology Studies: MR BRAIN WO CONTRAST Result Date: 12/20/2023 EXAM: MRI BRAIN WITHOUT CONTRAST 12/19/2023 08:45:00 PM TECHNIQUE: Multiplanar multisequence MRI of the head/brain was performed without the administration of intravenous contrast. COMPARISON: MR Head 12/16/2023 CLINICAL HISTORY: Stroke, follow up. FINDINGS: BRAIN AND VENTRICLES: Unchanged punctate focus of restricted diffusion in the right hippocampus as described on 12/16/2023 MRI. No new restricted diffusion No intracranial hemorrhage. No mass. No midline shift. No hydrocephalus. Normal flow voids. ORBITS: No acute abnormality. SINUSES AND MASTOIDS: No acute abnormality. BONES AND SOFT TISSUES: Normal marrow signal. No acute soft tissue abnormality. IMPRESSION: 1. Unchanged punctate focus of restricted diffusion in the right hippocampus as described on 12/16/2023 MRI. 2. No new/interval acute abnormality. Electronically signed by: Gilmore Molt MD 12/20/2023 02:27 AM EST RP Workstation: [id number]   CT Angio Chest Pulmonary Embolism (PE) W or WO Contrast Result Date: 12/19/2023 CLINICAL DATA:  Elevated D-dimer EXAM: CT ANGIOGRAPHY CHEST WITH CONTRAST TECHNIQUE: Multidetector CT imaging of the chest was performed using the standard protocol during bolus administration of intravenous contrast. Multiplanar CT image reconstructions and MIPs were obtained to evaluate the vascular anatomy. RADIATION DOSE REDUCTION: This exam was performed according to the departmental dose-optimization program which includes automated exposure control, adjustment of the mA and/or kV according to patient size and/or use of iterative reconstruction technique. CONTRAST:  75mL OMNIPAQUE  IOHEXOL  350 MG/ML SOLN COMPARISON:  Chest x-ray 12/18/2023 FINDINGS: Cardiovascular: Satisfactory opacification of the  pulmonary arteries to the segmental level. No evidence of pulmonary embolism. Mild aortic atherosclerosis. No aneurysm or dissection. Cardiomegaly. No pericardial effusion. Mediastinum/Nodes: Patent trachea. No thyroid  mass. No suspicious lymph nodes. Esophagus within normal limits. Lungs/Pleura: Small left-sided pleural effusion. Dependent atelectasis in the left base. Scarring or atelectasis right base. Linear  density in the left apex posteriorly extending toward pleural surface with nodular focus of airspace disease measuring up to 8 mm on series 5, image 25. Upper Abdomen: No acute finding Musculoskeletal: Acute appearing mildly displaced left fourth and fifth lateral rib fractures. Review of the MIP images confirms the above findings. IMPRESSION: 1. Negative for acute pulmonary embolus. 2. Acute appearing mildly displaced left fourth and fifth lateral rib fractures. Small left-sided pleural effusion with dependent atelectasis in the left base. 3. Linear density in the left apex posteriorly extending toward pleural surface with nodular focus of airspace disease measuring up to 8 mm. This could be due to scarring, focal atelectasis or nodule. Non-contrast chest CT at 6-12 months is recommended. If the nodule is stable at time of repeat CT, then future CT at 18-24 months (from today's scan) is considered optional for low-risk patients, but is recommended for high-risk patients. This recommendation follows the consensus statement: Guidelines for Management of Incidental Pulmonary Nodules Detected on CT Images: From the Fleischner Society 2017; Radiology 2017; 284:228-243. 4. Cardiomegaly. 5. Aortic atherosclerosis. Aortic Atherosclerosis (ICD10-I70.0). Electronically Signed   By: Luke Bun M.D.   On: 12/19/2023 22:20   Scheduled Meds:  acetaminophen   650 mg Oral Q6H   amLODipine   5 mg Oral Daily   aspirin EC  81 mg Oral Daily   atorvastatin   40 mg Oral Daily   benazepril   20 mg Oral Daily   heparin   5,000 Units Subcutaneous Q8H   pantoprazole   40 mg Oral BID   potassium chloride   40 mEq Oral Once   raloxifene   60 mg Oral Daily   sodium chloride  flush  3 mL Intravenous Q12H   traZODone   100 mg Oral QHS   venlafaxine  XR  150 mg Oral Q breakfast   Continuous Infusions:  lacosamide  (VIMPAT ) IV 100 mg (12/20/23 0957)     LOS: 3 days    Time spent: 35 mins    Darcel Dawley, MD [facility]   If 7PM-7AM, please contact night-coverage

## 2023-12-21 DIAGNOSIS — R42 Dizziness and giddiness: Secondary | ICD-10-CM | POA: Diagnosis not present

## 2023-12-21 LAB — BASIC METABOLIC PANEL WITH GFR
Anion gap: 14 (ref 5–15)
BUN: 21 mg/dL (ref 8–23)
CO2: 22 mmol/L (ref 22–32)
Calcium: 8.3 mg/dL — ABNORMAL LOW (ref 8.9–10.3)
Chloride: 104 mmol/L (ref 98–111)
Creatinine, Ser: 1.05 mg/dL — ABNORMAL HIGH (ref 0.44–1.00)
GFR, Estimated: 54 mL/min — ABNORMAL LOW (ref >60)
Glucose, Bld: 93 mg/dL (ref 70–99)
Potassium: 3.4 mmol/L — ABNORMAL LOW (ref 3.5–5.1)
Sodium: 140 mmol/L (ref 135–145)

## 2023-12-21 LAB — CBC
HCT: 32.3 % — ABNORMAL LOW (ref 36.0–46.0)
Hemoglobin: 10.4 g/dL — ABNORMAL LOW (ref 12.0–15.0)
MCH: 27.3 pg (ref 26.0–34.0)
MCHC: 32.2 g/dL (ref 30.0–36.0)
MCV: 84.8 fL (ref 80.0–100.0)
Platelets: 411 K/uL — ABNORMAL HIGH (ref 150–400)
RBC: 3.81 MIL/uL — ABNORMAL LOW (ref 3.87–5.11)
RDW: 16.7 % — ABNORMAL HIGH (ref 11.5–15.5)
WBC: 10.8 K/uL — ABNORMAL HIGH (ref 4.0–10.5)
nRBC: 0 % (ref 0.0–0.2)

## 2023-12-21 LAB — PHOSPHORUS: Phosphorus: 2.7 mg/dL (ref 2.5–4.6)

## 2023-12-21 LAB — GLUCOSE, CAPILLARY: Glucose-Capillary: 106 mg/dL — ABNORMAL HIGH (ref 70–99)

## 2023-12-21 LAB — MAGNESIUM: Magnesium: 1.5 mg/dL — ABNORMAL LOW (ref 1.7–2.4)

## 2023-12-21 MED ORDER — LACOSAMIDE 50 MG PO TABS
100.0000 mg | ORAL_TABLET | Freq: Two times a day (BID) | ORAL | Status: DC
  Administered 2023-12-21 – 2023-12-23 (×4): 100 mg via ORAL
  Filled 2023-12-21 (×4): qty 2

## 2023-12-21 MED ORDER — POTASSIUM CHLORIDE 20 MEQ PO PACK
40.0000 meq | PACK | Freq: Once | ORAL | Status: AC
  Administered 2023-12-21: 40 meq via ORAL
  Filled 2023-12-21: qty 2

## 2023-12-21 MED ORDER — MAGNESIUM SULFATE 2 GM/50ML IV SOLN
2.0000 g | Freq: Once | INTRAVENOUS | Status: AC
  Administered 2023-12-21: 2 g via INTRAVENOUS
  Filled 2023-12-21: qty 50

## 2023-12-21 NOTE — Progress Notes (Signed)
 Physical Therapy Treatment Patient Details Name: [name] MRN: [id number] DOB: 1945-03-06 Today's Date: 12/21/2023   History of Present Illness Pt is a 78 y.o. F who presents 12/16/2023 with multiple falls from feeling dizzy, slurred speech, and SOB. Hit head during fall on 10/31. MR showed punctate 3 mm focus of restricted diffusion involving the mesial right  temporal lobe/hippocampal formation. Primary  differential considerations include a punctate acute ischemic nonhemorrhagic  infarct versus changes of transient global amnesia. Performing EEG to rule out seizure. Was recently admitted 10/2023 for seizure-like activity. MRI with no evidence of acute intracranial abnormality. CT chest 11/5 revealed acute non-displaced L 4-5 lateral rib fractures. PMH positive for HTN, GERD, MDD, GAD, insomina, CVA.    PT Comments  Pt received in supine and agreeable to session despite reporting fatigue. Pt reports multiple trips to [facility] throughout the day. Pt able to perform BLE exercises and pivots to and from [facility] with CGA for safety. Pt demonstrates trembling throughout mobility, but no buckling or LOB without AD. Session limited by quick fatigue. Pt continues to benefit from PT services to progress toward functional mobility goals.    If plan is discharge home, recommend the following: A lot of help with walking and/or transfers;A lot of help with bathing/dressing/bathroom;Assistance with cooking/housework;Assistance with feeding;Direct supervision/assist for medications management;Direct supervision/assist for financial management;Assist for transportation;Help with stairs or ramp for entrance;Supervision due to cognitive status   Can travel by private vehicle        Equipment Recommendations  Other (comment) (TBA)    Recommendations for Other Services       Precautions / Restrictions Precautions Precautions: Fall Recall of Precautions/Restrictions: Intact Precaution/Restrictions Comments:  monitor HR Restrictions Weight Bearing Restrictions Per Provider Order: No     Mobility  Bed Mobility Overal bed mobility: Needs Assistance Bed Mobility: Supine to Sit     Supine to sit: Contact guard, Used rails, HOB elevated Sit to supine: Contact guard assist   General bed mobility comments: increased time    Transfers Overall transfer level: Needs assistance Equipment used: None Transfers: Sit to/from Stand, Bed to chair/wheelchair/BSC Sit to Stand: Contact guard assist   Step pivot transfers: Contact guard assist       General transfer comment: Pt appears shaky, but no buckling or LOB. Pt able to stand x2 and pivot to/from [facility] with CGA for safety    Ambulation/Gait               General Gait Details: pt deferred due to fatigue and shaking   Stairs             Wheelchair Mobility     Tilt Bed    Modified Rankin (Stroke Patients Only)       Balance Overall balance assessment: Needs assistance Sitting-balance support: No upper extremity supported, Feet supported Sitting balance-Leahy Scale: Fair Sitting balance - Comments: CGA sitting EOB   Standing balance support: Bilateral upper extremity supported, During functional activity, Reliant on assistive device for balance Standing balance-Leahy Scale: Fair Standing balance comment: CGA to pivot without AD                            Communication Communication Communication: Impaired Factors Affecting Communication: Hearing impaired  Cognition Arousal: Alert Behavior During Therapy: WFL for tasks assessed/performed   PT - Cognitive impairments: Attention, Problem solving  Following commands: Impaired Following commands impaired: Follows one step commands with increased time, Follows multi-step commands inconsistently    Cueing Cueing Techniques: Verbal cues, Visual cues  Exercises General Exercises - Lower Extremity Heel Slides: AROM,  Supine, Both, 5 reps Hip Flexion/Marching: AROM, Seated, Both, 5 reps    General Comments        Pertinent Vitals/Pain Pain Assessment Pain Assessment: Faces Faces Pain Scale: No hurt     PT Goals (current goals can now be found in the care plan section) Acute Rehab PT Goals Patient Stated Goal: Return home PT Goal Formulation: With patient/family Time For Goal Achievement: 01/01/24 Progress towards PT goals: Progressing toward goals    Frequency    Min 3X/week       AM-PAC PT 6 Clicks Mobility   Outcome Measure  Help needed turning from your back to your side while in a flat bed without using bedrails?: A Little Help needed moving from lying on your back to sitting on the side of a flat bed without using bedrails?: A Little Help needed moving to and from a bed to a chair (including a wheelchair)?: A Little Help needed standing up from a chair using your arms (e.g., wheelchair or bedside chair)?: A Little Help needed to walk in hospital room?: A Lot Help needed climbing 3-5 steps with a railing? : Total 6 Click Score: 15    End of Session Equipment Utilized During Treatment: Gait belt Activity Tolerance: Patient tolerated treatment well;Patient limited by fatigue Patient left: in bed;with call bell/phone within reach;with family/visitor present;with bed alarm set Nurse Communication: Mobility status PT Visit Diagnosis: Unsteadiness on feet (R26.81);History of falling (Z91.81);Muscle weakness (generalized) (M62.81)     Time: 8655-8640 PT Time Calculation (min) (ACUTE ONLY): 15 min  Charges:    $Therapeutic Activity: 8-22 mins PT General Charges $$ ACUTE PT VISIT: 1 Visit                     Darryle George, PTA Acute Rehabilitation Services Secure Chat Preferred  [phone]    Darryle George 12/21/2023, 2:46 PM

## 2023-12-21 NOTE — Plan of Care (Signed)
  Problem: Clinical Measurements: Goal: Ability to maintain clinical measurements within normal limits will improve Outcome: Progressing Goal: Will remain free from infection Outcome: Progressing Goal: Diagnostic test results will improve Outcome: Progressing Goal: Respiratory complications will improve Outcome: Progressing Goal: Cardiovascular complication will be avoided Outcome: Progressing   Problem: Nutrition: Goal: Adequate nutrition will be maintained Outcome: Progressing   Problem: Activity: Goal: Risk for activity intolerance will decrease Outcome: Progressing   Problem: Elimination: Goal: Will not experience complications related to bowel motility Outcome: Progressing Goal: Will not experience complications related to urinary retention Outcome: Progressing   Problem: Pain Managment: Goal: General experience of comfort will improve and/or be controlled Outcome: Progressing   Problem: Safety: Goal: Ability to remain free from injury will improve Outcome: Progressing   Problem: Skin Integrity: Goal: Risk for impaired skin integrity will decrease Outcome: Progressing

## 2023-12-21 NOTE — Progress Notes (Signed)
 IP rehab admissions - I do not have a rehab bed for this patient today.  We have limited beds over the weekend.  We will continue to follow progress.  [phone]

## 2023-12-21 NOTE — Progress Notes (Signed)
 PROGRESS NOTE    [name]  FMW:[id number] DOB: 28-May-1945 DOA: 12/16/2023  PCP: Wendolyn Jenkins Jansky, MD   Brief Narrative:  This 78 yrs old female with PMH significant for hypertension, hyperlipidemia, anxiety, previous history of stroke presented in the ED with altered mental status.  Patient reports that she has been having difficulty with her speech over the last couple of days as well as difficulty with her balance.  On her prior hospitalization she was monitored on EEG which did not demonstrate any seizure activity.  Patient was started on Vimpat  50 mg twice daily, it is uncertain if she is taking medicine.  LP was attempted but was unsuccessful even with fluoroscopy guidance.  Encephalopathy has  improved and She was discharged home. Workup in the ED reveals CT head negative for acute intracranial abnormality.  Patient was admitted for further evaluation.  Neurology is consulted.  Assessment & Plan:   Principal Problem:   Dizziness Active Problems:   Essential hypertension  Dizziness: Patient presented with dizziness and subsequent fall likely due to dehydration and hypovolemia. Continued on  IV fluid resuscitation, Continue fall precautions. PT/ OT > recommended acute inpatient rehab but family prefers home health services. She reports slight improvement in dizziness.  Dysarthria: MRI concerning for stroke. Neurology consulted recommended stroke workup. Continued on EEG monitoring. MRA > No large vessel occlusion or other vascular abnormality. Vit B12 235, Ammonia level 36, hemoglobin A1c 5.7. EEG suggestive of mild generalized cerebral dysfunction, no seizure or epileptic discharges were seen. Echo shows LVEF 65 to 70%, left ventricle has normal function.  No RWMA.  Neurologist recommended repeat MRI which was also negative for stroke. Continue aspirin 81 mg, Lipitor 40 mg daily. Discussed with family the option of tapering of Vimpat . Patient will also discontinue  use of all cannabis products including Gummies.  Left sided rib fractures: Mildly displaced left 4th and 5th lateral rib fractures likely as a result of fall. Continue adequate pain control.  Muscle relaxers. She reports pain is reasonably controlled.  Anion gap metabolic acidosis: Likely due to lactic acidosis which is resolved with IV hydration.  Chronic diarrhea: Patient has a GI appointment that is upcoming. This could be Infectious or GIB .   Less likely inflammatory or microscopic colitis or malignancy. Follow up outpatient.  Diarrhea improving.   Hypokalemia/ Hypomagnesemia: Replaced. Continue to monitor   Acute kidney injury: Baseline serum creatinine normal.  Presented with creatinine of 2.04 Serum creatinine improved with IV hydration.  AKI resolved.  Generalized anxiety disorder / depression: Continue venlafaxine .   Seizure disorder: Started tapering  Vimpat .   Continue seizure precautions,  aspiration and fall precautions.     DVT prophylaxis: Heparin sq Code Status:Full code Family Communication: Family at bed side. Disposition Plan:   Status is: Inpatient Remains inpatient appropriate because:      Admitted for acute encephalopathy likely multifactorial.  Workup is in process , Neurology is consulted.  EEG discontinued.  Patient is medically clear , awaiting CIR placement.  Consultants:  Neurology  Procedures: CT Head. MRI  brain  Antimicrobials:  Anti-infectives (From admission, onward)    None      Subjective: Patient was seen and examined at bedside.  Overnight events noted. Patient reports feeling better.  MRI and CTA chest negative for PE. EEG is discontinued.  She was sitting in the recliner , states pain is reasonably controlled  Objective: Vitals:   12/20/23 2309 12/21/23 0434 12/21/23 0435 12/21/23 0759  BP: (!) 151/61 ROLLEN)  169/62  (!) 160/65  Pulse: (!) 107 91  85  Resp: 19 17  17   Temp: 98.5 F (36.9 C) 98.3 F (36.8 C)   98.4 F (36.9 C)  TempSrc: Oral Oral  Oral  SpO2: 96% 96%  97%  Weight:   83.3 kg   Height:        Intake/Output Summary (Last 24 hours) at 12/21/2023 1120 Last data filed at 12/20/2023 2300 Gross per 24 hour  Intake 35 ml  Output --  Net 35 ml    Filed Weights   12/18/23 0500 12/19/23 0413 12/21/23 0435  Weight: 82.8 kg 84.7 kg 83.3 kg    Examination:  General exam: Appears calm and comfortable, not in any acute distress. Respiratory system: CTA Bilaterally.  Respiratory effort normal.  RR 16 Cardiovascular system: S1 & S2 heard, RRR. No JVD, murmurs, rubs, gallops or clicks. No pedal edema. Gastrointestinal system: Abdomen is non distended, soft and non tender. Normal bowel sounds heard. Central nervous system: Alert and oriented x 2. No focal neurological deficits. Extremities: No edema, no cyanosis, no clubbing. Skin: No rashes, lesions or ulcers Psychiatry: Judgement and insight appear normal. Mood & affect appropriate.     Data Reviewed: I have personally reviewed following labs and imaging studies  CBC: Recent Labs  Lab 12/16/23 1043 12/16/23 1203 12/16/23 2112 12/18/23 0255 12/19/23 0137 12/21/23 0117  WBC 14.3*  --   --  10.4 8.8 10.8*  NEUTROABS 12.6*  --   --   --   --   --   HGB 12.4 13.3 10.2* 10.8* 9.9* 10.4*  HCT 38.3 39.0 31.5* 33.5* 30.6* 32.3*  MCV 83.8  --   --  84.4 84.8 84.8  PLT 505*  --   --  454* 378 411*   Basic Metabolic Panel: Recent Labs  Lab 12/16/23 1658 12/16/23 2027 12/17/23 0602 12/18/23 0255 12/19/23 0137 12/21/23 0117  NA  --  135 138 140 141 140  K  --  3.0* 3.4* 3.3* 3.4* 3.4*  CL  --  100 102 105 105 104  CO2  --  19* 22 20* 22 22  GLUCOSE  --  105* 121* 133* 116* 93  BUN  --  24* 22 24* 28* 21  CREATININE  --  1.75* 1.37* 1.15* 0.97 1.05*  CALCIUM   --  8.4* 8.5* 8.6* 8.5* 8.3*  MG 2.1 1.9  --  2.0 1.9 1.5*  PHOS  --   --   --  3.1 2.5 2.7   GFR: Estimated Creatinine Clearance: 43.5 mL/min (A) (by C-G formula  based on SCr of 1.05 mg/dL (H)). Liver Function Tests: Recent Labs  Lab 12/16/23 1043  AST 33  ALT 25  ALKPHOS 103  BILITOT 1.1  PROT 7.5  ALBUMIN 4.1   No results for input(s): LIPASE, AMYLASE in the last 168 hours. Recent Labs  Lab 12/17/23 0602  AMMONIA 36*   Coagulation Profile: Recent Labs  Lab 12/16/23 1043  INR 1.1   Cardiac Enzymes: Recent Labs  Lab 12/16/23 1658  CKTOTAL 230   BNP (last 3 results) No results for input(s): PROBNP in the last 8760 hours. HbA1C: No results for input(s): HGBA1C in the last 72 hours.  CBG: Recent Labs  Lab 12/18/23 0645 12/18/23 1147 12/18/23 1626 12/19/23 0610 12/21/23 0551  GLUCAP 90 114* 117* 94 106*   Lipid Profile: No results for input(s): CHOL, HDL, LDLCALC, TRIG, CHOLHDL, LDLDIRECT in the last 72 hours.  Thyroid  Function Tests:  No results for input(s): TSH, T4TOTAL, FREET4, T3FREE, THYROIDAB in the last 72 hours.  Anemia Panel: No results for input(s): VITAMINB12, FOLATE, FERRITIN, TIBC, IRON, RETICCTPCT in the last 72 hours.  Sepsis Labs: Recent Labs  Lab 12/16/23 1434 12/16/23 1700 12/16/23 2032 12/16/23 2225  LATICACIDVEN 2.0* 3.1* 1.2 1.0    Recent Results (from the past 240 hours)  Blood culture (routine x 2)     Status: Abnormal   Collection Time: 12/16/23  8:25 PM   Specimen: BLOOD  Result Value Ref Range Status   Specimen Description BLOOD LEFT ANTECUBITAL  Final   Special Requests   Final    BOTTLES DRAWN AEROBIC AND ANAEROBIC Blood Culture results may not be optimal due to an inadequate volume of blood received in culture bottles   Culture  Setup Time   Final    GRAM POSITIVE COCCI IN BOTH AEROBIC AND ANAEROBIC BOTTLES CRITICAL RESULT CALLED TO, READ BACK BY AND VERIFIED WITH: PHARMD C. AMEND [id number] AT 1650, ADC Performed at [facility], [address], [place], [place] [address]    Culture STAPHYLOCOCCUS EPIDERMIDIS (A)  Final   Report  Status 12/19/2023 FINAL  Final   Organism ID, Bacteria STAPHYLOCOCCUS EPIDERMIDIS  Final      Susceptibility   Staphylococcus epidermidis - MIC*    CIPROFLOXACIN <=0.5 SENSITIVE Sensitive     ERYTHROMYCIN >=8 RESISTANT Resistant     GENTAMICIN <=0.5 SENSITIVE Sensitive     OXACILLIN >=4 RESISTANT Resistant     TETRACYCLINE >=16 RESISTANT Resistant     VANCOMYCIN  2 SENSITIVE Sensitive     TRIMETH/SULFA 160 RESISTANT Resistant     CLINDAMYCIN <=0.25 SENSITIVE Sensitive     RIFAMPIN <=0.5 SENSITIVE Sensitive     Inducible Clindamycin NEGATIVE Sensitive     * STAPHYLOCOCCUS EPIDERMIDIS  Blood Culture ID Panel (Reflexed)     Status: Abnormal   Collection Time: 12/16/23  8:25 PM  Result Value Ref Range Status   Enterococcus faecalis NOT DETECTED NOT DETECTED Final   Enterococcus Faecium NOT DETECTED NOT DETECTED Final   Listeria monocytogenes NOT DETECTED NOT DETECTED Final   Staphylococcus species DETECTED (A) NOT DETECTED Final    Comment: CRITICAL RESULT CALLED TO, READ BACK BY AND VERIFIED WITH: PHARMD C. AMEND [id number] AT 1650, ADC    Staphylococcus aureus (BCID) NOT DETECTED NOT DETECTED Final   Staphylococcus epidermidis DETECTED (A) NOT DETECTED Final    Comment: Methicillin (oxacillin) resistant coagulase negative staphylococcus. Possible blood culture contaminant (unless isolated from more than one blood culture draw or clinical case suggests pathogenicity). No antibiotic treatment is indicated for blood  culture contaminants. CRITICAL RESULT CALLED TO, READ BACK BY AND VERIFIED WITH: PHARMD C. AMEND [id number] AT 1650, ADC    Staphylococcus lugdunensis NOT DETECTED NOT DETECTED Final   Streptococcus species NOT DETECTED NOT DETECTED Final   Streptococcus agalactiae NOT DETECTED NOT DETECTED Final   Streptococcus pneumoniae NOT DETECTED NOT DETECTED Final   Streptococcus pyogenes NOT DETECTED NOT DETECTED Final   A.calcoaceticus-baumannii NOT DETECTED NOT DETECTED Final    Bacteroides fragilis NOT DETECTED NOT DETECTED Final   Enterobacterales NOT DETECTED NOT DETECTED Final   Enterobacter cloacae complex NOT DETECTED NOT DETECTED Final   Escherichia coli NOT DETECTED NOT DETECTED Final   Klebsiella aerogenes NOT DETECTED NOT DETECTED Final   Klebsiella oxytoca NOT DETECTED NOT DETECTED Final   Klebsiella pneumoniae NOT DETECTED NOT DETECTED Final   Proteus species NOT DETECTED NOT DETECTED Final   Salmonella species  NOT DETECTED NOT DETECTED Final   Serratia marcescens NOT DETECTED NOT DETECTED Final   Haemophilus influenzae NOT DETECTED NOT DETECTED Final   Neisseria meningitidis NOT DETECTED NOT DETECTED Final   Pseudomonas aeruginosa NOT DETECTED NOT DETECTED Final   Stenotrophomonas maltophilia NOT DETECTED NOT DETECTED Final   Candida albicans NOT DETECTED NOT DETECTED Final   Candida auris NOT DETECTED NOT DETECTED Final   Candida glabrata NOT DETECTED NOT DETECTED Final   Candida krusei NOT DETECTED NOT DETECTED Final   Candida parapsilosis NOT DETECTED NOT DETECTED Final   Candida tropicalis NOT DETECTED NOT DETECTED Final   Cryptococcus neoformans/gattii NOT DETECTED NOT DETECTED Final   Methicillin resistance mecA/C DETECTED (A) NOT DETECTED Final    Comment: CRITICAL RESULT CALLED TO, READ BACK BY AND VERIFIED WITH: PHARMD C. AMEND [id number] AT 1650, ADC Performed at [facility], [address] [place], [place] [address]   Blood culture (routine x 2)     Status: Abnormal   Collection Time: 12/16/23  8:30 PM   Specimen: BLOOD LEFT FOREARM  Result Value Ref Range Status   Specimen Description BLOOD LEFT FOREARM  Final   Special Requests   Final    BOTTLES DRAWN AEROBIC AND ANAEROBIC Blood Culture results may not be optimal due to an inadequate volume of blood received in culture bottles   Culture  Setup Time   Final    GRAM POSITIVE COCCI IN CLUSTERS AEROBIC BOTTLE ONLY CRITICAL VALUE NOTED.  VALUE IS CONSISTENT WITH PREVIOUSLY  REPORTED AND CALLED VALUE.    Culture (A)  Final    STAPHYLOCOCCUS EPIDERMIDIS SUSCEPTIBILITIES PERFORMED ON PREVIOUS CULTURE WITHIN THE LAST 5 DAYS. Performed at [facility], [address] [place], [place] [address]    Report Status 12/19/2023 FINAL  Final    Radiology Studies: MR BRAIN WO CONTRAST Result Date: 12/20/2023 EXAM: MRI BRAIN WITHOUT CONTRAST 12/19/2023 08:45:00 PM TECHNIQUE: Multiplanar multisequence MRI of the head/brain was performed without the administration of intravenous contrast. COMPARISON: MR Head 12/16/2023 CLINICAL HISTORY: Stroke, follow up. FINDINGS: BRAIN AND VENTRICLES: Unchanged punctate focus of restricted diffusion in the right hippocampus as described on 12/16/2023 MRI. No new restricted diffusion No intracranial hemorrhage. No mass. No midline shift. No hydrocephalus. Normal flow voids. ORBITS: No acute abnormality. SINUSES AND MASTOIDS: No acute abnormality. BONES AND SOFT TISSUES: Normal marrow signal. No acute soft tissue abnormality. IMPRESSION: 1. Unchanged punctate focus of restricted diffusion in the right hippocampus as described on 12/16/2023 MRI. 2. No new/interval acute abnormality. Electronically signed by: Gilmore Molt MD 12/20/2023 02:27 AM EST RP Workstation: [id number]   CT Angio Chest Pulmonary Embolism (PE) W or WO Contrast Result Date: 12/19/2023 CLINICAL DATA:  Elevated D-dimer EXAM: CT ANGIOGRAPHY CHEST WITH CONTRAST TECHNIQUE: Multidetector CT imaging of the chest was performed using the standard protocol during bolus administration of intravenous contrast. Multiplanar CT image reconstructions and MIPs were obtained to evaluate the vascular anatomy. RADIATION DOSE REDUCTION: This exam was performed according to the departmental dose-optimization program which includes automated exposure control, adjustment of the mA and/or kV according to patient size and/or use of iterative reconstruction technique. CONTRAST:  75mL OMNIPAQUE  IOHEXOL   350 MG/ML SOLN COMPARISON:  Chest x-ray 12/18/2023 FINDINGS: Cardiovascular: Satisfactory opacification of the pulmonary arteries to the segmental level. No evidence of pulmonary embolism. Mild aortic atherosclerosis. No aneurysm or dissection. Cardiomegaly. No pericardial effusion. Mediastinum/Nodes: Patent trachea. No thyroid  mass. No suspicious lymph nodes. Esophagus within normal limits. Lungs/Pleura: Small left-sided pleural effusion.  Dependent atelectasis in the left base. Scarring or atelectasis right base. Linear density in the left apex posteriorly extending toward pleural surface with nodular focus of airspace disease measuring up to 8 mm on series 5, image 25. Upper Abdomen: No acute finding Musculoskeletal: Acute appearing mildly displaced left fourth and fifth lateral rib fractures. Review of the MIP images confirms the above findings. IMPRESSION: 1. Negative for acute pulmonary embolus. 2. Acute appearing mildly displaced left fourth and fifth lateral rib fractures. Small left-sided pleural effusion with dependent atelectasis in the left base. 3. Linear density in the left apex posteriorly extending toward pleural surface with nodular focus of airspace disease measuring up to 8 mm. This could be due to scarring, focal atelectasis or nodule. Non-contrast chest CT at 6-12 months is recommended. If the nodule is stable at time of repeat CT, then future CT at 18-24 months (from today's scan) is considered optional for low-risk patients, but is recommended for high-risk patients. This recommendation follows the consensus statement: Guidelines for Management of Incidental Pulmonary Nodules Detected on CT Images: From the Fleischner Society 2017; Radiology 2017; 284:228-243. 4. Cardiomegaly. 5. Aortic atherosclerosis. Aortic Atherosclerosis (ICD10-I70.0). Electronically Signed   By: Luke Bun M.D.   On: 12/19/2023 22:20   Scheduled Meds:  acetaminophen   650 mg Oral Q6H   amLODipine   5 mg Oral Daily    aspirin EC  81 mg Oral Daily   atorvastatin   40 mg Oral Daily   benazepril   20 mg Oral Daily   heparin  5,000 Units Subcutaneous Q8H   lacosamide   100 mg Oral BID   pantoprazole   40 mg Oral BID   raloxifene   60 mg Oral Daily   sodium chloride  flush  3 mL Intravenous Q12H   traZODone   100 mg Oral QHS   venlafaxine  XR  150 mg Oral Q breakfast   Continuous Infusions:     LOS: 4 days    Time spent: 35 mins    Darcel Dawley, MD [facility]   If 7PM-7AM, please contact night-coverage

## 2023-12-21 NOTE — Progress Notes (Signed)
 Occupational Therapy Treatment Patient Details Name: [name] MRN: [id number] DOB: Jul 07, 1945 Today's Date: 12/21/2023   History of present illness Pt is a 78 y.o. F who presents 12/16/2023 with multiple falls from feeling dizzy, slurred speech, and SOB. Hit head during fall on 10/31. MR showed punctate 3 mm focus of restricted diffusion involving the mesial right  temporal lobe/hippocampal formation. Primary  differential considerations include a punctate acute ischemic nonhemorrhagic  infarct versus changes of transient global amnesia. Performing EEG to rule out seizure. Was recently admitted 10/2023 for seizure-like activity. MRI with no evidence of acute intracranial abnormality. CT chest 11/5 revealed acute non-displaced L 4-5 lateral rib fractures. PMH positive for HTN, GERD, MDD, GAD, insomina, CVA.   OT comments  Pt received in supine, initially reluctant, reporting she wanted to lay in bed and be a bump on a log because she did not sleep well last night. With encouragement, very participatory and engaged. She completed various seated UB/LB ADLs at EOB as detailed below. Ambulated to chair with RW with min A, cues for sequencing and safe approach to chair. HR 100 to start, up to 130s with mobility; setup with breakfast tray in chair. Pt is demonstrating excellent progress towards goals, will continue to follow.      If plan is discharge home, recommend the following:  A little help with walking and/or transfers;A little help with bathing/dressing/bathroom;Assistance with cooking/housework;Direct supervision/assist for medications management;Direct supervision/assist for financial management;Assist for transportation;Supervision due to cognitive status   Equipment Recommendations  Other (comment) (defer to next level of care)    Recommendations for Other Services      Precautions / Restrictions Precautions Precautions: Fall Recall of Precautions/Restrictions:  Intact Precaution/Restrictions Comments: monitor HR Restrictions Weight Bearing Restrictions Per Provider Order: No       Mobility Bed Mobility Overal bed mobility: Needs Assistance Bed Mobility: Supine to Sit     Supine to sit: Contact guard, Used rails, HOB elevated     General bed mobility comments: Exited to the L side, incr effort to scoot towards EOB    Transfers Overall transfer level: Needs assistance Equipment used: Rolling walker (2 wheels) Transfers: Sit to/from Stand, Bed to chair/wheelchair/BSC Sit to Stand: Min assist     Step pivot transfers: Min assist     General transfer comment: Ambulated to recliner with RW, cues for steady pace and to maintain appropriate proximity to RW     Balance Overall balance assessment: Needs assistance Sitting-balance support: No upper extremity supported, Feet supported Sitting balance-Leahy Scale: Fair Sitting balance - Comments: seated EOB, no LOB   Standing balance support: Bilateral upper extremity supported, During functional activity, Reliant on assistive device for balance Standing balance-Leahy Scale: Poor Standing balance comment: CGA-min A, reliant on RW during mobility                           ADL either performed or assessed with clinical judgement   ADL Overall ADL's : Needs assistance/impaired Eating/Feeding: Set up;Sitting Eating/Feeding Details (indicate cue type and reason): up in chair Grooming: Set up;Wash/dry face;Oral care;Applying deodorant;Sitting Grooming Details (indicate cue type and reason): seated EOB Upper Body Bathing: Set up;Sitting   Lower Body Bathing: Minimal assistance;Sitting/lateral leans;Sit to/from stand Lower Body Bathing Details (indicate cue type and reason): thoroughness for peri area bathing Upper Body Dressing : Minimal assistance;Sitting Upper Body Dressing Details (indicate cue type and reason): gown exchange, assist for IV mgmt and threading clean gown  fully  over shoulders Lower Body Dressing: Minimal assistance;Sitting/lateral leans Lower Body Dressing Details (indicate cue type and reason): exchanging dirty socks for clean ones via figure four method with incr effort Toilet Transfer: Contact guard assist;Stand-pivot;BSC/3in1;Rolling walker (2 wheels) Toilet Transfer Details (indicate cue type and reason): cued for safe approach to [facility]   Toileting - Clothing Manipulation Details (indicate cue type and reason): attempted to void, unable     Functional mobility during ADLs: Minimal assistance;Rolling walker (2 wheels)      Extremity/Trunk Assessment              Vision       Perception     Praxis     Communication Communication Communication: Impaired Factors Affecting Communication: Hearing impaired (hearing aides worn during OT session)   Cognition Arousal: Alert Behavior During Therapy: Anxious (shaky, anxious-appearing) Cognition: Cognition impaired     Awareness: Online awareness impaired Memory impairment (select all impairments): Short-term memory, Working memory Attention impairment (select first level of impairment): Selective attention Executive functioning impairment (select all impairments): Organization, Sequencing, Reasoning, Problem solving OT - Cognition Comments: overall cognition & attention improving this date, repeats self during session                 Following commands: Impaired Following commands impaired: Follows one step commands with increased time, Follows multi-step commands inconsistently      Cueing   Cueing Techniques: Verbal cues, Visual cues  Exercises      Shoulder Instructions       General Comments HR 100-130s during session    Pertinent Vitals/ Pain       Pain Assessment Pain Assessment: Faces Faces Pain Scale: Hurts a little bit Pain Location: L flank, back Pain Descriptors / Indicators: Sore Pain Intervention(s): Monitored during session, Limited activity within  patient's tolerance, Repositioned  Home Living                                          Prior Functioning/Environment              Frequency  Min 2X/week        Progress Toward Goals  OT Goals(current goals can now be found in the care plan section)  Progress towards OT goals: Progressing toward goals     Plan      Co-evaluation                 AM-PAC OT 6 Clicks Daily Activity     Outcome Measure   Help from another person eating meals?: None Help from another person taking care of personal grooming?: A Little Help from another person toileting, which includes using toliet, bedpan, or urinal?: A Little Help from another person bathing (including washing, rinsing, drying)?: A Little Help from another person to put on and taking off regular upper body clothing?: A Lot Help from another person to put on and taking off regular lower body clothing?: A Little 6 Click Score: 18    End of Session Equipment Utilized During Treatment: Rolling walker (2 wheels)  OT Visit Diagnosis: Unsteadiness on feet (R26.81);History of falling (Z91.81);Muscle weakness (generalized) (M62.81);Cognitive communication deficit (R41.841) Symptoms and signs involving cognitive functions: Other cerebrovascular disease   Activity Tolerance Patient tolerated treatment well   Patient Left in chair;with call bell/phone within reach;with chair alarm set;with nursing/sitter in room   Nurse Communication Mobility status  Time: 9197-9159 OT Time Calculation (min): 38 min  Charges: OT General Charges $OT Visit: 1 Visit OT Treatments $Self Care/Home Management : 38-52 mins  Zyshonne Malecha D., MSOT, OTR/L Acute Rehabilitation Services [phone] Secure Chat Preferred  Rikki Milch 12/21/2023, 10:22 AM

## 2023-12-22 DIAGNOSIS — R42 Dizziness and giddiness: Secondary | ICD-10-CM | POA: Diagnosis not present

## 2023-12-22 LAB — GLUCOSE, CAPILLARY: Glucose-Capillary: 107 mg/dL — ABNORMAL HIGH (ref 70–99)

## 2023-12-22 LAB — BASIC METABOLIC PANEL WITH GFR
Anion gap: 15 (ref 5–15)
BUN: 17 mg/dL (ref 8–23)
CO2: 20 mmol/L — ABNORMAL LOW (ref 22–32)
Calcium: 8.7 mg/dL — ABNORMAL LOW (ref 8.9–10.3)
Chloride: 106 mmol/L (ref 98–111)
Creatinine, Ser: 0.93 mg/dL (ref 0.44–1.00)
GFR, Estimated: >60 mL/min (ref >60)
Glucose, Bld: 98 mg/dL (ref 70–99)
Potassium: 3.3 mmol/L — ABNORMAL LOW (ref 3.5–5.1)
Sodium: 141 mmol/L (ref 135–145)

## 2023-12-22 LAB — MAGNESIUM: Magnesium: 1.8 mg/dL (ref 1.7–2.4)

## 2023-12-22 LAB — CBC
HCT: 32.6 % — ABNORMAL LOW (ref 36.0–46.0)
Hemoglobin: 10.8 g/dL — ABNORMAL LOW (ref 12.0–15.0)
MCH: 27.8 pg (ref 26.0–34.0)
MCHC: 33.1 g/dL (ref 30.0–36.0)
MCV: 83.8 fL (ref 80.0–100.0)
Platelets: 440 K/uL — ABNORMAL HIGH (ref 150–400)
RBC: 3.89 MIL/uL (ref 3.87–5.11)
RDW: 16.9 % — ABNORMAL HIGH (ref 11.5–15.5)
WBC: 9.7 K/uL (ref 4.0–10.5)
nRBC: 0 % (ref 0.0–0.2)

## 2023-12-22 LAB — PHOSPHORUS: Phosphorus: 3.1 mg/dL (ref 2.5–4.6)

## 2023-12-22 NOTE — Plan of Care (Signed)

## 2023-12-22 NOTE — Progress Notes (Signed)
 PROGRESS NOTE    [name]  FMW:[id number] DOB: May 18, 1945 DOA: 12/16/2023  PCP: Wendolyn Jenkins Jansky, MD   Brief Narrative:  This 78 yrs old female with PMH significant for hypertension, hyperlipidemia, anxiety, previous history of stroke presented in the ED with altered mental status.  Patient reports that she has been having difficulty with her speech over the last couple of days as well as difficulty with her balance.  On her prior hospitalization she was monitored on EEG which did not demonstrate any seizure activity.  Patient was started on Vimpat  50 mg twice daily, it is uncertain if she is taking medicine.  LP was attempted but was unsuccessful even with fluoroscopy guidance.  Encephalopathy has  improved and She was discharged home. Workup in the ED reveals CT head negative for acute intracranial abnormality.  Patient was admitted for further evaluation.  Neurology is consulted.  Assessment & Plan:   Principal Problem:   Dizziness Active Problems:   Essential hypertension  Dizziness: Patient presented with dizziness and subsequent fall likely due to dehydration and hypovolemia. Continued on  IV fluid resuscitation, Continue fall precautions. PT/ OT > recommended acute inpatient rehab but family prefers home health services. She reports slight improvement in dizziness.  Dysarthria: MRI concerning for stroke. Neurology consulted recommended stroke workup. Continued on EEG monitoring. MRA > No large vessel occlusion or other vascular abnormality. Vit B12 235, Ammonia level 36, hemoglobin A1c 5.7. EEG suggestive of mild generalized cerebral dysfunction, no seizure or epileptic discharges were seen. Echo shows LVEF 65 to 70%, left ventricle has normal function.  No RWMA.  Neurologist recommended repeat MRI which was also negative for stroke. Continue aspirin 81 mg, Lipitor 40 mg daily. Discussed with family the option of tapering of Vimpat . Patient will also discontinue  use of all cannabis products including Gummies.  Left sided rib fractures: Mildly displaced left 4th and 5th lateral rib fractures likely as a result of fall. Continue adequate pain control.  Muscle relaxers. She reports pain is reasonably controlled.  Anion gap metabolic acidosis: Likely due to lactic acidosis which is resolved with IV hydration.  Chronic diarrhea: Patient has a GI appointment that is upcoming. This could be Infectious or GIB .   Less likely inflammatory or microscopic colitis or malignancy. Follow up outpatient.  Diarrhea improving.   Hypokalemia/ Hypomagnesemia: Replaced. Continue to monitor   Acute kidney injury: Baseline serum creatinine normal.  Presented with creatinine of 2.04 Serum creatinine improved with IV hydration.  AKI resolved.  Generalized anxiety disorder / depression: Continue venlafaxine .   Seizure disorder: Started tapering  Vimpat .   Continue seizure precautions,  aspiration and fall precautions.     DVT prophylaxis: Heparin sq Code Status:Full code Family Communication: No Family at bed side. Disposition Plan:   Status is: Inpatient Remains inpatient appropriate because:     Admitted for acute encephalopathy likely multifactorial.  Workup is in process , Neurology is consulted.  EEG discontinued.  Patient is medically clear , awaiting CIR placement.  Consultants:  Neurology  Procedures: CT Head. MRI  brain  Antimicrobials:  Anti-infectives (From admission, onward)    None      Subjective: Patient was seen and examined at bedside.  Overnight events noted. Patient reports feeling better.  MRI and CTA chest negative for PE. EEG is discontinued.  Patient says she is ready to be discharged to rehab  Objective: Vitals:   12/21/23 2328 12/22/23 0425 12/22/23 0555 12/22/23 0746  BP: (!) 149/64 121/69  ROLLEN)  148/61  Pulse: 94 98  92  Resp: 16 16  16   Temp: 98.4 F (36.9 C) 98.2 F (36.8 C)  98 F (36.7 C)  TempSrc:  Oral Oral  Oral  SpO2: 98% 93%  97%  Weight:   84 kg   Height:        Intake/Output Summary (Last 24 hours) at 12/22/2023 1133 Last data filed at 12/21/2023 2150 Gross per 24 hour  Intake 3 ml  Output --  Net 3 ml    Filed Weights   12/19/23 0413 12/21/23 0435 12/22/23 0555  Weight: 84.7 kg 83.3 kg 84 kg    Examination:  General exam: Appears calm and comfortable, not in any acute distress. Respiratory system: CTA Bilaterally.  Respiratory effort normal.  RR 14 Cardiovascular system: S1 & S2 heard, RRR. No JVD, murmurs, rubs, gallops or clicks. No pedal edema. Gastrointestinal system: Abdomen is non distended, soft and non tender. Normal bowel sounds heard. Central nervous system: Alert and oriented x 3. No focal neurological deficits. Extremities: No edema, no cyanosis, no clubbing. Skin: No rashes, lesions or ulcers Psychiatry: Judgement and insight appear normal. Mood & affect appropriate.     Data Reviewed: I have personally reviewed following labs and imaging studies  CBC: Recent Labs  Lab 12/16/23 1043 12/16/23 1203 12/16/23 2112 12/18/23 0255 12/19/23 0137 12/21/23 0117 12/22/23 0239  WBC 14.3*  --   --  10.4 8.8 10.8* 9.7  NEUTROABS 12.6*  --   --   --   --   --   --   HGB 12.4   < > 10.2* 10.8* 9.9* 10.4* 10.8*  HCT 38.3   < > 31.5* 33.5* 30.6* 32.3* 32.6*  MCV 83.8  --   --  84.4 84.8 84.8 83.8  PLT 505*  --   --  454* 378 411* 440*   < > = values in this interval not displayed.   Basic Metabolic Panel: Recent Labs  Lab 12/16/23 2027 12/17/23 0602 12/18/23 0255 12/19/23 0137 12/21/23 0117 12/22/23 0239  NA 135 138 140 141 140 141  K 3.0* 3.4* 3.3* 3.4* 3.4* 3.3*  CL 100 102 105 105 104 106  CO2 19* 22 20* 22 22 20*  GLUCOSE 105* 121* 133* 116* 93 98  BUN 24* 22 24* 28* 21 17  CREATININE 1.75* 1.37* 1.15* 0.97 1.05* 0.93  CALCIUM  8.4* 8.5* 8.6* 8.5* 8.3* 8.7*  MG 1.9  --  2.0 1.9 1.5* 1.8  PHOS  --   --  3.1 2.5 2.7 3.1   GFR: Estimated  Creatinine Clearance: 49.3 mL/min (by C-G formula based on SCr of 0.93 mg/dL). Liver Function Tests: Recent Labs  Lab 12/16/23 1043  AST 33  ALT 25  ALKPHOS 103  BILITOT 1.1  PROT 7.5  ALBUMIN 4.1   No results for input(s): LIPASE, AMYLASE in the last 168 hours. Recent Labs  Lab 12/17/23 0602  AMMONIA 36*   Coagulation Profile: Recent Labs  Lab 12/16/23 1043  INR 1.1   Cardiac Enzymes: Recent Labs  Lab 12/16/23 1658  CKTOTAL 230   BNP (last 3 results) No results for input(s): PROBNP in the last 8760 hours. HbA1C: No results for input(s): HGBA1C in the last 72 hours.  CBG: Recent Labs  Lab 12/18/23 1147 12/18/23 1626 12/19/23 0610 12/21/23 0551 12/22/23 0427  GLUCAP 114* 117* 94 106* 107*   Lipid Profile: No results for input(s): CHOL, HDL, LDLCALC, TRIG, CHOLHDL, LDLDIRECT in the last 72 hours.  Thyroid  Function Tests: No results for input(s): TSH, T4TOTAL, FREET4, T3FREE, THYROIDAB in the last 72 hours.  Anemia Panel: No results for input(s): VITAMINB12, FOLATE, FERRITIN, TIBC, IRON, RETICCTPCT in the last 72 hours.  Sepsis Labs: Recent Labs  Lab 12/16/23 1434 12/16/23 1700 12/16/23 2032 12/16/23 2225  LATICACIDVEN 2.0* 3.1* 1.2 1.0    Recent Results (from the past 240 hours)  Blood culture (routine x 2)     Status: Abnormal   Collection Time: 12/16/23  8:25 PM   Specimen: BLOOD  Result Value Ref Range Status   Specimen Description BLOOD LEFT ANTECUBITAL  Final   Special Requests   Final    BOTTLES DRAWN AEROBIC AND ANAEROBIC Blood Culture results may not be optimal due to an inadequate volume of blood received in culture bottles   Culture  Setup Time   Final    GRAM POSITIVE COCCI IN BOTH AEROBIC AND ANAEROBIC BOTTLES CRITICAL RESULT CALLED TO, READ BACK BY AND VERIFIED WITH: PHARMD C. AMEND [id number] AT 1650, ADC Performed at [facility], [address], [place], [place] [address]    Culture  STAPHYLOCOCCUS EPIDERMIDIS (A)  Final   Report Status 12/19/2023 FINAL  Final   Organism ID, Bacteria STAPHYLOCOCCUS EPIDERMIDIS  Final      Susceptibility   Staphylococcus epidermidis - MIC*    CIPROFLOXACIN <=0.5 SENSITIVE Sensitive     ERYTHROMYCIN >=8 RESISTANT Resistant     GENTAMICIN <=0.5 SENSITIVE Sensitive     OXACILLIN >=4 RESISTANT Resistant     TETRACYCLINE >=16 RESISTANT Resistant     VANCOMYCIN  2 SENSITIVE Sensitive     TRIMETH/SULFA 160 RESISTANT Resistant     CLINDAMYCIN <=0.25 SENSITIVE Sensitive     RIFAMPIN <=0.5 SENSITIVE Sensitive     Inducible Clindamycin NEGATIVE Sensitive     * STAPHYLOCOCCUS EPIDERMIDIS  Blood Culture ID Panel (Reflexed)     Status: Abnormal   Collection Time: 12/16/23  8:25 PM  Result Value Ref Range Status   Enterococcus faecalis NOT DETECTED NOT DETECTED Final   Enterococcus Faecium NOT DETECTED NOT DETECTED Final   Listeria monocytogenes NOT DETECTED NOT DETECTED Final   Staphylococcus species DETECTED (A) NOT DETECTED Final    Comment: CRITICAL RESULT CALLED TO, READ BACK BY AND VERIFIED WITH: PHARMD C. AMEND [id number] AT 1650, ADC    Staphylococcus aureus (BCID) NOT DETECTED NOT DETECTED Final   Staphylococcus epidermidis DETECTED (A) NOT DETECTED Final    Comment: Methicillin (oxacillin) resistant coagulase negative staphylococcus. Possible blood culture contaminant (unless isolated from more than one blood culture draw or clinical case suggests pathogenicity). No antibiotic treatment is indicated for blood  culture contaminants. CRITICAL RESULT CALLED TO, READ BACK BY AND VERIFIED WITH: PHARMD C. AMEND [id number] AT 1650, ADC    Staphylococcus lugdunensis NOT DETECTED NOT DETECTED Final   Streptococcus species NOT DETECTED NOT DETECTED Final   Streptococcus agalactiae NOT DETECTED NOT DETECTED Final   Streptococcus pneumoniae NOT DETECTED NOT DETECTED Final   Streptococcus pyogenes NOT DETECTED NOT DETECTED Final    A.calcoaceticus-baumannii NOT DETECTED NOT DETECTED Final   Bacteroides fragilis NOT DETECTED NOT DETECTED Final   Enterobacterales NOT DETECTED NOT DETECTED Final   Enterobacter cloacae complex NOT DETECTED NOT DETECTED Final   Escherichia coli NOT DETECTED NOT DETECTED Final   Klebsiella aerogenes NOT DETECTED NOT DETECTED Final   Klebsiella oxytoca NOT DETECTED NOT DETECTED Final   Klebsiella pneumoniae NOT DETECTED NOT DETECTED Final   Proteus species NOT DETECTED NOT DETECTED Final  Salmonella species NOT DETECTED NOT DETECTED Final   Serratia marcescens NOT DETECTED NOT DETECTED Final   Haemophilus influenzae NOT DETECTED NOT DETECTED Final   Neisseria meningitidis NOT DETECTED NOT DETECTED Final   Pseudomonas aeruginosa NOT DETECTED NOT DETECTED Final   Stenotrophomonas maltophilia NOT DETECTED NOT DETECTED Final   Candida albicans NOT DETECTED NOT DETECTED Final   Candida auris NOT DETECTED NOT DETECTED Final   Candida glabrata NOT DETECTED NOT DETECTED Final   Candida krusei NOT DETECTED NOT DETECTED Final   Candida parapsilosis NOT DETECTED NOT DETECTED Final   Candida tropicalis NOT DETECTED NOT DETECTED Final   Cryptococcus neoformans/gattii NOT DETECTED NOT DETECTED Final   Methicillin resistance mecA/C DETECTED (A) NOT DETECTED Final    Comment: CRITICAL RESULT CALLED TO, READ BACK BY AND VERIFIED WITH: PHARMD C. AMEND [id number] AT 1650, ADC Performed at [facility], [address], [place], [place] [address]   Blood culture (routine x 2)     Status: Abnormal   Collection Time: 12/16/23  8:30 PM   Specimen: BLOOD LEFT FOREARM  Result Value Ref Range Status   Specimen Description BLOOD LEFT FOREARM  Final   Special Requests   Final    BOTTLES DRAWN AEROBIC AND ANAEROBIC Blood Culture results may not be optimal due to an inadequate volume of blood received in culture bottles   Culture  Setup Time   Final    GRAM POSITIVE COCCI IN CLUSTERS AEROBIC BOTTLE  ONLY CRITICAL VALUE NOTED.  VALUE IS CONSISTENT WITH PREVIOUSLY REPORTED AND CALLED VALUE.    Culture (A)  Final    STAPHYLOCOCCUS EPIDERMIDIS SUSCEPTIBILITIES PERFORMED ON PREVIOUS CULTURE WITHIN THE LAST 5 DAYS. Performed at [facility], [address], [place], [place] [address]    Report Status 12/19/2023 FINAL  Final    Radiology Studies: No results found.  Scheduled Meds:  acetaminophen   650 mg Oral Q6H   amLODipine   5 mg Oral Daily   aspirin EC  81 mg Oral Daily   atorvastatin   40 mg Oral Daily   benazepril   20 mg Oral Daily   heparin  5,000 Units Subcutaneous Q8H   lacosamide   100 mg Oral BID   pantoprazole   40 mg Oral BID   raloxifene   60 mg Oral Daily   sodium chloride  flush  3 mL Intravenous Q12H   traZODone   100 mg Oral QHS   venlafaxine  XR  150 mg Oral Q breakfast   Continuous Infusions:     LOS: 5 days    Time spent: 35 mins    Darcel Dawley, MD [facility]   If 7PM-7AM, please contact night-coverage

## 2023-12-23 ENCOUNTER — Encounter (HOSPITAL_COMMUNITY): Payer: Self-pay | Admitting: Physical Medicine and Rehabilitation

## 2023-12-23 ENCOUNTER — Other Ambulatory Visit (HOSPITAL_COMMUNITY): Payer: Self-pay

## 2023-12-23 ENCOUNTER — Other Ambulatory Visit: Payer: Self-pay

## 2023-12-23 ENCOUNTER — Inpatient Hospital Stay (HOSPITAL_COMMUNITY)
Admission: RE | Admit: 2023-12-23 | Discharge: 2023-12-27 | DRG: 945 | Disposition: A | Discharge status: Home / Self Care | Source: Intra-hospital | Attending: Physical Medicine and Rehabilitation | Admitting: Physical Medicine and Rehabilitation

## 2023-12-23 DIAGNOSIS — G9341 Metabolic encephalopathy: Secondary | ICD-10-CM | POA: Diagnosis not present

## 2023-12-23 DIAGNOSIS — R5381 Other malaise: Principal | ICD-10-CM | POA: Diagnosis present

## 2023-12-23 DIAGNOSIS — E861 Hypovolemia: Secondary | ICD-10-CM | POA: Diagnosis present

## 2023-12-23 DIAGNOSIS — Z8249 Family history of ischemic heart disease and other diseases of the circulatory system: Secondary | ICD-10-CM | POA: Diagnosis not present

## 2023-12-23 DIAGNOSIS — Z79899 Other long term (current) drug therapy: Secondary | ICD-10-CM

## 2023-12-23 DIAGNOSIS — Z7982 Long term (current) use of aspirin: Secondary | ICD-10-CM

## 2023-12-23 DIAGNOSIS — M8580 Other specified disorders of bone density and structure, unspecified site: Secondary | ICD-10-CM | POA: Diagnosis present

## 2023-12-23 DIAGNOSIS — Z7409 Other reduced mobility: Secondary | ICD-10-CM | POA: Diagnosis present

## 2023-12-23 DIAGNOSIS — F5101 Primary insomnia: Secondary | ICD-10-CM | POA: Diagnosis present

## 2023-12-23 DIAGNOSIS — E785 Hyperlipidemia, unspecified: Secondary | ICD-10-CM | POA: Diagnosis present

## 2023-12-23 DIAGNOSIS — F3340 Major depressive disorder, recurrent, in remission, unspecified: Secondary | ICD-10-CM | POA: Diagnosis present

## 2023-12-23 DIAGNOSIS — Z822 Family history of deafness and hearing loss: Secondary | ICD-10-CM

## 2023-12-23 DIAGNOSIS — F411 Generalized anxiety disorder: Secondary | ICD-10-CM | POA: Diagnosis not present

## 2023-12-23 DIAGNOSIS — Z882 Allergy status to sulfonamides status: Secondary | ICD-10-CM

## 2023-12-23 DIAGNOSIS — R569 Unspecified convulsions: Secondary | ICD-10-CM | POA: Diagnosis present

## 2023-12-23 DIAGNOSIS — Z8261 Family history of arthritis: Secondary | ICD-10-CM

## 2023-12-23 DIAGNOSIS — E860 Dehydration: Secondary | ICD-10-CM | POA: Diagnosis present

## 2023-12-23 DIAGNOSIS — I129 Hypertensive chronic kidney disease with stage 1 through stage 4 chronic kidney disease, or unspecified chronic kidney disease: Secondary | ICD-10-CM | POA: Diagnosis present

## 2023-12-23 DIAGNOSIS — R32 Unspecified urinary incontinence: Secondary | ICD-10-CM | POA: Diagnosis not present

## 2023-12-23 DIAGNOSIS — R42 Dizziness and giddiness: Secondary | ICD-10-CM | POA: Diagnosis not present

## 2023-12-23 DIAGNOSIS — Z888 Allergy status to other drugs, medicaments and biological substances status: Secondary | ICD-10-CM

## 2023-12-23 DIAGNOSIS — E876 Hypokalemia: Secondary | ICD-10-CM | POA: Diagnosis present

## 2023-12-23 DIAGNOSIS — Z96651 Presence of right artificial knee joint: Secondary | ICD-10-CM | POA: Diagnosis present

## 2023-12-23 DIAGNOSIS — Z880 Allergy status to penicillin: Secondary | ICD-10-CM

## 2023-12-23 DIAGNOSIS — M1711 Unilateral primary osteoarthritis, right knee: Secondary | ICD-10-CM | POA: Diagnosis present

## 2023-12-23 DIAGNOSIS — Z825 Family history of asthma and other chronic lower respiratory diseases: Secondary | ICD-10-CM

## 2023-12-23 DIAGNOSIS — Z83438 Family history of other disorder of lipoprotein metabolism and other lipidemia: Secondary | ICD-10-CM

## 2023-12-23 DIAGNOSIS — R471 Dysarthria and anarthria: Secondary | ICD-10-CM | POA: Diagnosis present

## 2023-12-23 DIAGNOSIS — Z811 Family history of alcohol abuse and dependence: Secondary | ICD-10-CM | POA: Diagnosis not present

## 2023-12-23 DIAGNOSIS — K529 Noninfective gastroenteritis and colitis, unspecified: Secondary | ICD-10-CM | POA: Diagnosis present

## 2023-12-23 DIAGNOSIS — N189 Chronic kidney disease, unspecified: Secondary | ICD-10-CM | POA: Diagnosis present

## 2023-12-23 DIAGNOSIS — Z818 Family history of other mental and behavioral disorders: Secondary | ICD-10-CM | POA: Diagnosis not present

## 2023-12-23 DIAGNOSIS — E872 Acidosis, unspecified: Secondary | ICD-10-CM | POA: Diagnosis present

## 2023-12-23 DIAGNOSIS — F1290 Cannabis use, unspecified, uncomplicated: Secondary | ICD-10-CM | POA: Diagnosis present

## 2023-12-23 LAB — GASTROINTESTINAL PANEL BY PCR, STOOL (REPLACES STOOL CULTURE)

## 2023-12-23 MED ORDER — BENAZEPRIL HCL 20 MG PO TABS
20.0000 mg | ORAL_TABLET | Freq: Every day | ORAL | Status: DC
  Administered 2023-12-24 – 2023-12-27 (×4): 20 mg via ORAL
  Filled 2023-12-23 (×5): qty 1

## 2023-12-23 MED ORDER — LACOSAMIDE 50 MG PO TABS
100.0000 mg | ORAL_TABLET | Freq: Two times a day (BID) | ORAL | Status: DC
  Administered 2023-12-23 – 2023-12-27 (×8): 100 mg via ORAL
  Filled 2023-12-23 (×8): qty 2

## 2023-12-23 MED ORDER — ONDANSETRON HCL 4 MG/2ML IJ SOLN: 4.0000 mg | Freq: Four times a day (QID) | INTRAMUSCULAR | Status: DC | PRN

## 2023-12-23 MED ORDER — PANTOPRAZOLE SODIUM 40 MG PO TBEC
40.0000 mg | DELAYED_RELEASE_TABLET | Freq: Two times a day (BID) | ORAL | Status: DC
  Administered 2023-12-23 – 2023-12-27 (×8): 40 mg via ORAL
  Filled 2023-12-23 (×8): qty 1

## 2023-12-23 MED ORDER — ATORVASTATIN CALCIUM 40 MG PO TABS
40.0000 mg | ORAL_TABLET | Freq: Every day | ORAL | Status: DC
  Administered 2023-12-24 – 2023-12-27 (×4): 40 mg via ORAL
  Filled 2023-12-23 (×4): qty 1

## 2023-12-23 MED ORDER — SODIUM CHLORIDE 0.9% FLUSH
3.0000 mL | Freq: Two times a day (BID) | INTRAVENOUS | Status: DC
  Administered 2023-12-23 – 2023-12-24 (×3): 3 mL via INTRAVENOUS

## 2023-12-23 MED ORDER — ACETAMINOPHEN 325 MG PO TABS
650.0000 mg | ORAL_TABLET | Freq: Four times a day (QID) | ORAL | Status: DC
  Administered 2023-12-23 – 2023-12-27 (×14): 650 mg via ORAL
  Filled 2023-12-23 (×13): qty 2

## 2023-12-23 MED ORDER — VENLAFAXINE HCL ER 150 MG PO CP24
150.0000 mg | ORAL_CAPSULE | Freq: Every day | ORAL | Status: DC
  Administered 2023-12-24 – 2023-12-27 (×4): 150 mg via ORAL
  Filled 2023-12-23 (×4): qty 1

## 2023-12-23 MED ORDER — METHOCARBAMOL 500 MG PO TABS
500.0000 mg | ORAL_TABLET | Freq: Three times a day (TID) | ORAL | 0 refills | Status: DC | PRN
  Filled 2023-12-23: qty 30, 10d supply, fill #0

## 2023-12-23 MED ORDER — ASPIRIN 81 MG PO TBEC
81.0000 mg | DELAYED_RELEASE_TABLET | Freq: Every day | ORAL | 12 refills | Status: AC
  Filled 2023-12-23: qty 30, 30d supply, fill #0

## 2023-12-23 MED ORDER — HEPARIN SODIUM (PORCINE) 5000 UNIT/ML IJ SOLN
5000.0000 [IU] | Freq: Three times a day (TID) | INTRAMUSCULAR | Status: DC
  Administered 2023-12-23 – 2023-12-24 (×3): 5000 [IU] via SUBCUTANEOUS
  Filled 2023-12-23 (×3): qty 1

## 2023-12-23 MED ORDER — METHOCARBAMOL 500 MG PO TABS
500.0000 mg | ORAL_TABLET | Freq: Three times a day (TID) | ORAL | Status: DC | PRN
Start: 2023-12-23 — End: 2023-12-27
  Administered 2023-12-25 – 2023-12-26 (×2): 500 mg via ORAL
  Filled 2023-12-23 (×2): qty 1

## 2023-12-23 MED ORDER — TRAZODONE HCL 50 MG PO TABS
100.0000 mg | ORAL_TABLET | Freq: Every day | ORAL | Status: DC
  Administered 2023-12-23 – 2023-12-24 (×2): 100 mg via ORAL
  Filled 2023-12-23 (×2): qty 2

## 2023-12-23 MED ORDER — ACETAMINOPHEN 325 MG PO TABS: 650.0000 mg | ORAL_TABLET | Freq: Four times a day (QID) | ORAL | Status: DC | PRN

## 2023-12-23 MED ORDER — RALOXIFENE HCL 60 MG PO TABS
60.0000 mg | ORAL_TABLET | Freq: Every day | ORAL | Status: DC
  Administered 2023-12-24 – 2023-12-27 (×4): 60 mg via ORAL
  Filled 2023-12-23 (×4): qty 1

## 2023-12-23 MED ORDER — ASPIRIN 81 MG PO TBEC
81.0000 mg | DELAYED_RELEASE_TABLET | Freq: Every day | ORAL | Status: DC
  Administered 2023-12-24 – 2023-12-27 (×4): 81 mg via ORAL
  Filled 2023-12-23 (×4): qty 1

## 2023-12-23 MED ORDER — AMLODIPINE BESYLATE 5 MG PO TABS
5.0000 mg | ORAL_TABLET | Freq: Every day | ORAL | Status: DC
  Administered 2023-12-24 – 2023-12-27 (×4): 5 mg via ORAL
  Filled 2023-12-23 (×4): qty 1

## 2023-12-23 NOTE — Progress Notes (Signed)
 PMR Admission Coordinator Pre-Admission Assessment   Patient: [name] is an 78 y.o., female MRN: [id number] DOB: 05-26-1945 Height: 5' 2 (157.5 cm) Weight: 83.2 kg   Insurance Information HMO:     PPO:      PCP:      IPA:      80/20: yes     OTHER:  PRIMARY: Medicare Part A and B      Policy#: [id number]      Subscriber: patient CM Name:       Phone#:      Fax#:  Pre-Cert#:       Employer:  Benefits:  Phone #: verified eligibility via [employer]     Name:  Eff. Date: Part A and B effective 10/14/09     Deduct: $1,676      Out of Pocket Max: NA      Life Max: NA CIR: 100% coverage      SNF: 100% coverage for days 1-20, 80% coverage for days 21-100 Outpatient: 80% coverage     Co-Pay: 20% Home Health: 100% coverage      Co-Pay:  DME: 80% coverage     Co-Pay: 20% Providers: pt's choice SECONDARY: U[employer]/U[employer] PPO      Policy#: [id number]     Phone#: [phone]   Financial Counselor:       Phone#:    The "Data Collection Information Summary" for patients in Inpatient Rehabilitation Facilities with attached "Privacy Act Statement-Health Care Records" was provided and verbally reviewed with: Family   Emergency Contact Information Contact Information       Name Relation Home Work Mobile    [name] Spouse     [phone]    [name], Garbers     [phone]         Other Contacts   None on File        Current Medical History  Patient Admitting Diagnosis: CVA History of Present Illness: Pt is a 78 year old female with medical hx significant for: HTN, hyperlipidemia, osteopenia, seizures, CKD. Pt was hospitalized in September with AMS. Pt presented to [facility] on 12/16/23 d/t falls and slurred speech. Pt hit head. Pt also reported nausea and loose bowel movements. CT head negative for acute abnormality. Chest x-ray revealed mild cardiomegaly with no acute infiltrates. C-spine, right knee, right rib, left shoulder x-rays negative. Urinalysis WNL. MRI showed  possible punctate stroke. MRA negative for LVO. LTM EEG on 11/4 negative for seizures. LTM EEG on 11/5 negative for seizures. Therapy evaluations completed and CIR recommended d/t pt's deficits in functional mobility. Complete NIHSS TOTAL: 1   Patient's medical record from [facility] has been reviewed by the rehabilitation admission coordinator and physician.   Past Medical History      Past Medical History:  Diagnosis Date   Allergy 1980's    Sulfa drugs and penecillin   Anxiety     Cataract 2021    Corrected   Chronic kidney disease 2024    Stage 3   Depression     Headache      SINUS   Hyperlipidemia     Hypertension 2021   Osteopenia     Osteopenia     Primary localized osteoarthritis of right knee     Seizures (HCC) 10/2023          Has the patient had major surgery during 100 days prior to admission? No   Family History   family history includes Alcohol abuse in her brother,  maternal aunt, and maternal aunt; Anxiety disorder in her mother; Arthritis in her mother and sister; Asthma in her brother; Depression in her mother and son; Early death in her brother and father; Hearing loss in her father, mother, and sister; Heart attack in her brother, brother, father, and mother; Heart disease in her brother, father, mother, and sister; Heart failure in her mother; Hyperlipidemia in her brother and sister; Hypertension in her brother and sister; Irregular heart beat in her sister; Varicose Veins in her mother.   Current Medications  Current Medications    Current Facility-Administered Medications:    acetaminophen  (TYLENOL ) tablet 650 mg, 650 mg, Oral, Q6H PRN, Rathore, Vasundhra, MD, 650 mg at 12/20/23 0506   acetaminophen  (TYLENOL ) tablet 650 mg, 650 mg, Oral, Q6H, Khatri, Pardeep, MD, 650 mg at 12/23/23 0602   amLODipine  (NORVASC ) tablet 5 mg, 5 mg, Oral, Daily, Leotis, Pardeep, MD, 5 mg at 12/23/23 9078   aspirin EC tablet 81 mg, 81 mg, Oral, Daily, Tobie Modest V,  MD, 81 mg at 12/23/23 9078   atorvastatin  (LIPITOR) tablet 40 mg, 40 mg, Oral, Daily, Patel, Ekta V, MD, 40 mg at 12/23/23 9078   benazepril  (LOTENSIN ) tablet 20 mg, 20 mg, Oral, Daily, Leotis, Pardeep, MD, 20 mg at 12/23/23 0921   heparin injection 5,000 Units, 5,000 Units, Subcutaneous, Q8H, Tobie Modest V, MD, 5,000 Units at 12/23/23 0603   lacosamide  (VIMPAT ) tablet 100 mg, 100 mg, Oral, BID, Khatri, Pardeep, MD, 100 mg at 12/23/23 0921   methocarbamol (ROBAXIN) tablet 500 mg, 500 mg, Oral, Q8H PRN, Leotis, Pardeep, MD, 500 mg at 12/22/23 2137   ondansetron  (ZOFRAN ) injection 4 mg, 4 mg, Intravenous, Q6H PRN, Howerter, Justin B, DO, 4 mg at 12/17/23 0132   pantoprazole  (PROTONIX ) EC tablet 40 mg, 40 mg, Oral, BID, Chen, Lydia D, RPH, 40 mg at 12/23/23 9078   raloxifene  (EVISTA ) tablet 60 mg, 60 mg, Oral, Daily, Tobie Modest V, MD, 60 mg at 12/23/23 0921   sodium chloride  flush (NS) 0.9 % injection 3 mL, 3 mL, Intravenous, Q12H, Tobie Modest V, MD, 3 mL at 12/23/23 9077   traZODone  (DESYREL ) tablet 100 mg, 100 mg, Oral, QHS, Lindzen, Eric, MD, 100 mg at 12/22/23 2137   venlafaxine  XR (EFFEXOR -XR) 24 hr capsule 150 mg, 150 mg, Oral, Q breakfast, Tobie Modest V, MD, 150 mg at 12/23/23 9078     Patients Current Diet:  Diet Order                  DIET DYS 3 Room service appropriate? Yes; Fluid consistency: Thin  Diet effective now                         Precautions / Restrictions Precautions Precautions: Fall Precaution/Restrictions Comments: monitor HR Restrictions Weight Bearing Restrictions Per Provider Order: No    Has the patient had 2 or more falls or a fall with injury in the past year? Yes   Prior Activity Level   Prior Functional Level Self Care: Did the patient need help bathing, dressing, using the toilet or eating? Independent   Indoor Mobility: Did the patient need assistance with walking from room to room (with or without device)? Independent   Stairs: Did the  patient need assistance with internal or external stairs (with or without device)? Independent   Functional Cognition: Did the patient need help planning regular tasks such as shopping or remembering to take medications? Independent   Patient Information Are  you of Hispanic, Latino/a,or Spanish origin?: A. No, not of Hispanic, Latino/a, or Spanish origin What is your race?: A. White Do you need or want an interpreter to communicate with a doctor or health care staff?: 0. No   Patient's Response To:  Health Literacy and Transportation Health Literacy - How often do you need to have someone help you when you read instructions, pamphlets, or other written material from your doctor or pharmacy?: Never In the past 12 months, has lack of transportation kept you from medical appointments or from getting medications?: No In the past 12 months, has lack of transportation kept you from meetings, work, or from getting things needed for daily living?: No   Home Assistive Devices / Equipment Home Equipment: Agricultural Consultant (2 wheels), [employer] - single point, [employer] - tub/shower, Information systems manager   Prior Device Use: Indicate devices/aids used by the patient prior to current illness, exacerbation or injury? Walker (used occasionally)   Current Functional Level Cognition   Orientation Level: Oriented X4    Extremity Assessment (includes Sensation/Coordination)   Upper Extremity Assessment: Right hand dominant, Generalized weakness (proximal>distal weakness (husband reporting pt with hx arthritis)) RUE Deficits / Details: Grossly 4/5 (shoulder flexion, elbow flexion/extension) RUE Coordination: decreased gross motor LUE Deficits / Details: Grossly 4/5 (shoulder flexion, elbow flexion/extension) LUE Coordination: decreased gross motor  Lower Extremity Assessment: Generalized weakness RLE Deficits / Details: Grossly 4/5 (hip flexion, knee flexion/extension, ankle PF/DF) LLE Deficits / Details: Grossly 4/5  (hip flexion, knee flexion/extension, ankle PF/DF)     ADLs   Overall ADL's : Needs assistance/impaired Eating/Feeding: Set up, Sitting Eating/Feeding Details (indicate cue type and reason): up in chair Grooming: Set up, Wash/dry face, Oral care, Applying deodorant, Sitting Grooming Details (indicate cue type and reason): seated EOB Upper Body Bathing: Set up, Sitting Lower Body Bathing: Minimal assistance, Sitting/lateral leans, Sit to/from stand Lower Body Bathing Details (indicate cue type and reason): thoroughness for peri area bathing Upper Body Dressing : Minimal assistance, Sitting Upper Body Dressing Details (indicate cue type and reason): gown exchange, assist for IV mgmt and threading clean gown fully over shoulders Lower Body Dressing: Minimal assistance, Sitting/lateral leans Lower Body Dressing Details (indicate cue type and reason): exchanging dirty socks for clean ones via figure four method with incr effort Toilet Transfer: Contact guard assist, Stand-pivot, BSC/3in1, Rolling walker (2 wheels) Toilet Transfer Details (indicate cue type and reason): cued for safe approach to [facility] Toileting- Clothing Manipulation and Hygiene: Minimal assistance, Sitting/lateral lean Toileting - Clothing Manipulation Details (indicate cue type and reason): attempted to void, unable Functional mobility during ADLs: Minimal assistance, Rolling walker (2 wheels)     Mobility   Overal bed mobility: Needs Assistance Bed Mobility: Supine to Sit Supine to sit: Contact guard, Used rails, HOB elevated Sit to supine: Contact guard assist General bed mobility comments: increased time     Transfers   Overall transfer level: Needs assistance Equipment used: None Transfers: Sit to/from Stand, Bed to chair/wheelchair/BSC Sit to Stand: Contact guard assist Bed to/from chair/wheelchair/BSC transfer type:: Step pivot Step pivot transfers: Contact guard assist General transfer comment: Pt appears shaky,  but no buckling or LOB. Pt able to stand x2 and pivot to/from [facility] with CGA for safety     Ambulation / Gait / Stairs / Wheelchair Mobility   Ambulation/Gait General Gait Details: pt deferred due to fatigue and shaking     Posture / Balance Dynamic Sitting Balance Sitting balance - Comments: CGA sitting EOB Balance Overall  balance assessment: Needs assistance Sitting-balance support: No upper extremity supported, Feet supported Sitting balance-Leahy Scale: Fair Sitting balance - Comments: CGA sitting EOB Standing balance support: Bilateral upper extremity supported, During functional activity, Reliant on assistive device for balance Standing balance-Leahy Scale: Fair Standing balance comment: CGA to pivot without AD     Special considerations/life events  Skin Ecchymosis: arm, back, buttocks, leg/bilateral and External Urinary Catheter    Previous Home Environment (from acute therapy documentation) Living Arrangements: Spouse/significant other Available Help at Discharge: Family, Available 24 hours/day Type of Home: House Home Layout: One level Home Access: Stairs to enter Entrance Stairs-Rails: Right Entrance Stairs-Number of Steps: 2 Bathroom Shower/Tub: Health Visitor: Administrator Accessibility: No Home Care Services: Yes Type of Home Care Services: Home PT Additional Comments: son and spouse present to assist with providing PLOF (pt questionable historian)   Discharge Living Setting Plans for Discharge Living Setting: House Type of Home at Discharge: House Discharge Home Layout: One level Discharge Home Access: Stairs to enter Entrance Stairs-Rails: Right Entrance Stairs-Number of Steps: 2 Discharge Bathroom Shower/Tub: Walk-in shower Discharge Bathroom Toilet: Standard Discharge Bathroom Accessibility: No Does the patient have any problems obtaining your medications?: No   Social/Family/Support Systems Patient Roles: Spouse Contact  Information: [phone] Anticipated Caregiver: [name] Ability/Limitations of Caregiver: 24/7 Supervision Caregiver Availability: 24/7 Discharge Plan Discussed with Primary Caregiver: Yes Is Caregiver In Agreement with Plan?: Yes Does Caregiver/Family have Issues with Lodging/Transportation while Pt is in Rehab?: No   Goals Patient/Family Goal for Rehab: PT/OT Supervision Expected length of stay: 7-10 days Pt/Family Agrees to Admission and willing to participate: Yes Program Orientation Provided & Reviewed with Pt/Caregiver Including Roles  & Responsibilities: Yes   Decrease burden of Care through IP rehab admission: NA   Possible need for SNF placement upon discharge: Not anticipated   Patient Condition: I have reviewed medical records from [facility], spoken with CM, and patient and spouse. I met with patient at the bedside and discussed via phone for inpatient rehabilitation assessment.  Patient will benefit from ongoing PT, OT, and SLP, can actively participate in 3 hours of therapy a day 5 days of the week, and can make measurable gains during the admission.  Patient will also benefit from the coordinated team approach during an Inpatient Acute Rehabilitation admission.  The patient will receive intensive therapy as well as Rehabilitation physician, nursing, social worker, and care management interventions.  Due to bowel management, safety, skin/wound care, disease management, medication administration, pain management, and patient education the patient requires 24 hour a day rehabilitation nursing.  The patient is currently  with mobility and basic ADLs.  Discharge setting and therapy post discharge at home with home health is anticipated.  Patient has agreed to participate in the Acute Inpatient Rehabilitation Program and will admit today.   Preadmission Screen Completed By:  Tinnie SHAUNNA Yvone Delayne, 12/23/2023 10:57  AM ______________________________________________________________________   Discussed status with Dr. Lorilee  on 12/23/23 at 1051 and received approval for admission today.   Admission Coordinator:  Tinnie SHAUNNA Yvone Delayne, CCC-SLP, time 1051/Date 12/23/23    Assessment/Plan: Diagnosis: Debility 2/2 dizziness 2/2 dehydration and hypovolemia Does the need for close, 24 hr/day Medical supervision in concert with the patient's rehab needs make it unreasonable for this patient to be served in a less intensive setting? Yes Co-Morbidities requiring supervision/potential complications: dysarthria, left sided rib fractures, anion gap metabolic acidosis, chronic diarrhea, hypokalemia and hypomagnesemia Due to bladder management, bowel management, safety, skin/wound care,  disease management, medication administration, pain management, and patient education, does the patient require 24 hr/day rehab nursing? Yes Does the patient require coordinated care of a physician, rehab nurse, PT, OT to address physical and functional deficits in the context of the above medical diagnosis(es)? Yes Addressing deficits in the following areas: balance, endurance, locomotion, strength, transferring, bowel/bladder control, bathing, dressing, feeding, grooming, toileting, and psychosocial support Can the patient actively participate in an intensive therapy program of at least 3 hrs of therapy 5 days a week? Yes The potential for patient to make measurable gains while on inpatient rehab is excellent Anticipated functional outcomes upon discharge from inpatient rehab: supervision PT, supervision OT, supervision SLP Estimated rehab length of stay to reach the above functional goals is: 10-14 days Anticipated discharge destination: Home 10. Overall Rehab/Functional Prognosis: excellent     MD Signature: Sven Elks, MD

## 2023-12-23 NOTE — Discharge Summary (Signed)
 Physician Discharge Summary  [name] FMW:[id number] DOB: 1945-05-25 DOA: 12/16/2023  PCP: Wendolyn Jenkins Jansky, MD  Admit date: 12/16/2023  Discharge date: 12/23/2023  Admitted From: Home  Disposition:  Acute Inpatient Rehab.  Recommendations for Outpatient Follow-up:  Follow up with PCP in 1-2 weeks Please obtain BMP/CBC in one week Patient is being discharged to acute inpatient rehab in hospital.  Home Health: None Equipment/Devices:None  Discharge Condition: Stable CODE STATUS: Full code Diet recommendation: Heart Healthy   Brief [facility] Course: This 78 yrs old female with PMH significant for hypertension, hyperlipidemia, anxiety, previous history of stroke presented in the ED with altered mental status. Patient reports that she has been having difficulty with her speech over the last couple of days as well as difficulty with her balance. On her prior hospitalization she was monitored on EEG which did not demonstrate any seizure activity. Patient was started on Vimpat  50 mg twice daily, it is uncertain if she is taking medicine. LP was attempted but was unsuccessful even with fluoroscopy guidance. Encephalopathy has improved and She was discharged home. Workup in the ED reveals CT head negative for acute intracranial abnormality. Patient was admitted for further evaluation. Neurology was consulted.  MRI ruled out stroke.  EEG shows generalized cerebral dysfunction, No seizure or epileptiform discharges were seen.  Echocardiogram showed LVEF 65 to 70%,  Left ventricle has normal function, Patient was continued on aspirin and Lipitor. Patient has been taking cannabis products that could be contributing to her symptoms.  Advised family to discontinue cannabis products including Gummies. Patient did have left-sided 4th and 5th rib fractures as a result of fall.  Pain was reasonably controlled.  Patient approved for acute inpatient rehab and patient being  discharged.  Discharge Diagnoses:  Principal Problem:   Dizziness Active Problems:   Essential hypertension  Dizziness: Patient presented with dizziness and subsequent fall likely due to dehydration and hypovolemia. Continued on  IV fluid resuscitation, Continue fall precautions. PT/ OT > recommended acute inpatient rehab but family prefers home health services. She reports slight improvement in dizziness.   Dysarthria: MRI concerning for stroke. Neurology consulted recommended stroke workup. Continued on EEG monitoring. MRA > No large vessel occlusion or other vascular abnormality. Vit B12 235, Ammonia level 36, hemoglobin A1c 5.7. EEG suggestive of mild generalized cerebral dysfunction, no seizure or epileptic discharges were seen. Echo shows LVEF 65 to 70%, left ventricle has normal function.  No RWMA.  Neurologist recommended repeat MRI which was also negative for stroke. Continue aspirin 81 mg, Lipitor 40 mg daily. Discussed with family the option of tapering of Vimpat . Patient will also discontinue use of all cannabis products including Gummies. Neurology signed off.   Left sided rib fractures: Mildly displaced left 4th and 5th lateral rib fractures likely as a result of fall. Continue adequate pain control.  Muscle relaxers. She reports pain is reasonably controlled.   Anion gap metabolic acidosis: Likely due to lactic acidosis which is resolved with IV hydration.   Chronic diarrhea: Patient has a GI appointment that is upcoming. This could be Infectious or GIB .   Less likely inflammatory or microscopic colitis or malignancy. Follow up outpatient.  Diarrhea improving.   Hypokalemia/ Hypomagnesemia: Replaced. Continue to monitor   Acute kidney injury: Baseline serum creatinine normal.  Presented with creatinine of 2.04 Serum creatinine improved with IV hydration.  AKI resolved.   Generalized anxiety disorder / depression: Continue venlafaxine .   Seizure  disorder: Started tapering  Vimpat .  Continue seizure precautions,  aspiration and fall precautions.     Discharge Instructions  Discharge Instructions     Call MD for:  difficulty breathing, headache or visual disturbances   Complete by: As directed    Call MD for:  persistant nausea and vomiting   Complete by: As directed    Diet - low sodium heart healthy   Complete by: As directed    Diet general   Complete by: As directed    Discharge instructions   Complete by: As directed    Advised to follow-up with primary care physician in 1 week. Patient is being discharged to acute inpatient rehab in hospital.   Increase activity slowly   Complete by: As directed       Allergies as of 12/23/2023       Reactions   Penicillins Hives   Tolerates Cephalosporins   Prednisone  Other (See Comments)   Hyperactivity Sleep disturbance Confusion   Sulfa Antibiotics Hives           Medication List     STOP taking these medications    ibuprofen 600 MG tablet Commonly known as: ADVIL   THC FREE PO       TAKE these medications    acetaminophen  500 MG tablet Commonly known as: TYLENOL  Take 500 mg by mouth every 6 (six) hours as needed for moderate pain (pain score 4-6).   amLODipine -benazepril  5-20 MG capsule Commonly known as: LOTREL  Take 1 capsule by mouth daily.   aspirin EC 81 MG tablet Take 1 tablet (81 mg total) by mouth daily. Swallow whole. Start taking on: December 24, 2023   atorvastatin  20 MG tablet Commonly known as: LIPITOR Take 1 tablet (20 mg total) by mouth daily.   cyanocobalamin  1000 MCG tablet Take 0.5 tablets (500 mcg total) by mouth daily.   cyclobenzaprine  5 MG tablet Commonly known as: FLEXERIL  Take 1 tablet (5 mg total) by mouth 3 (three) times daily as needed for muscle spasms.   lacosamide  50 MG Tabs tablet Commonly known as: VIMPAT  Take 1 tablet (50 mg total) by mouth 2 (two) times daily.   methocarbamol 500 MG tablet Commonly  known as: ROBAXIN Take 1 tablet (500 mg total) by mouth every 8 (eight) hours as needed for muscle spasms.   Multivitamin Women 50+ Tabs Take 1 tablet by mouth daily.   omeprazole  40 MG capsule Commonly known as: PRILOSEC Take 1 capsule (40 mg total) by mouth daily.   OVER THE COUNTER MEDICATION Take 1 capsule by mouth every 12 (twelve) hours as needed (headache, cold/flu-like symptoms). OTC cold capsules   Polyethyl Glycol-Propyl Glycol 0.4-0.3 % Soln Place 1 drop into both eyes at bedtime.   QUEtiapine  25 MG tablet Commonly known as: SEROQUEL  Take 25 mg by mouth at bedtime.   raloxifene  60 MG tablet Commonly known as: EVISTA  Take 1 tablet (60 mg total) by mouth daily.   triamterene -hydrochlorothiazide  37.5-25 MG capsule Commonly known as: DYAZIDE  Take 1 capsule by mouth daily.   venlafaxine  XR 150 MG 24 hr capsule Commonly known as: EFFEXOR -XR Take 1 capsule (150 mg total) by mouth daily with breakfast.   zolpidem  5 MG tablet Commonly known as: AMBIEN  Take 1-1.5 tablets (5-7.5 mg total) by mouth at bedtime as needed for sleep.        Follow-up Information     Wendolyn Jenkins Jansky, MD Follow up in 1 week(s).   Specialty: Family Medicine Contact information: [address] [place] [address] [phone]  Allergies  Allergen Reactions   Penicillins Hives    Tolerates Cephalosporins    Prednisone  Other (See Comments)    Hyperactivity Sleep disturbance Confusion   Sulfa Antibiotics Hives         Consultations: Neurology   Procedures/Studies: MR BRAIN WO CONTRAST Result Date: 12/20/2023 EXAM: MRI BRAIN WITHOUT CONTRAST 12/19/2023 08:45:00 PM TECHNIQUE: Multiplanar multisequence MRI of the head/brain was performed without the administration of intravenous contrast. COMPARISON: MR Head 12/16/2023 CLINICAL HISTORY: Stroke, follow up. FINDINGS: BRAIN AND VENTRICLES: Unchanged punctate focus of restricted diffusion in the right  hippocampus as described on 12/16/2023 MRI. No new restricted diffusion No intracranial hemorrhage. No mass. No midline shift. No hydrocephalus. Normal flow voids. ORBITS: No acute abnormality. SINUSES AND MASTOIDS: No acute abnormality. BONES AND SOFT TISSUES: Normal marrow signal. No acute soft tissue abnormality. IMPRESSION: 1. Unchanged punctate focus of restricted diffusion in the right hippocampus as described on 12/16/2023 MRI. 2. No new/interval acute abnormality. Electronically signed by: Gilmore Molt MD 12/20/2023 02:27 AM EST RP Workstation: [id number]   CT Angio Chest Pulmonary Embolism (PE) W or WO Contrast Result Date: 12/19/2023 CLINICAL DATA:  Elevated D-dimer EXAM: CT ANGIOGRAPHY CHEST WITH CONTRAST TECHNIQUE: Multidetector CT imaging of the chest was performed using the standard protocol during bolus administration of intravenous contrast. Multiplanar CT image reconstructions and MIPs were obtained to evaluate the vascular anatomy. RADIATION DOSE REDUCTION: This exam was performed according to the departmental dose-optimization program which includes automated exposure control, adjustment of the mA and/or kV according to patient size and/or use of iterative reconstruction technique. CONTRAST:  75mL OMNIPAQUE  IOHEXOL  350 MG/ML SOLN COMPARISON:  Chest x-ray 12/18/2023 FINDINGS: Cardiovascular: Satisfactory opacification of the pulmonary arteries to the segmental level. No evidence of pulmonary embolism. Mild aortic atherosclerosis. No aneurysm or dissection. Cardiomegaly. No pericardial effusion. Mediastinum/Nodes: Patent trachea. No thyroid  mass. No suspicious lymph nodes. Esophagus within normal limits. Lungs/Pleura: Small left-sided pleural effusion. Dependent atelectasis in the left base. Scarring or atelectasis right base. Linear density in the left apex posteriorly extending toward pleural surface with nodular focus of airspace disease measuring up to 8 mm on series 5, image 25. Upper  Abdomen: No acute finding Musculoskeletal: Acute appearing mildly displaced left fourth and fifth lateral rib fractures. Review of the MIP images confirms the above findings. IMPRESSION: 1. Negative for acute pulmonary embolus. 2. Acute appearing mildly displaced left fourth and fifth lateral rib fractures. Small left-sided pleural effusion with dependent atelectasis in the left base. 3. Linear density in the left apex posteriorly extending toward pleural surface with nodular focus of airspace disease measuring up to 8 mm. This could be due to scarring, focal atelectasis or nodule. Non-contrast chest CT at 6-12 months is recommended. If the nodule is stable at time of repeat CT, then future CT at 18-24 months (from today's scan) is considered optional for low-risk patients, but is recommended for high-risk patients. This recommendation follows the consensus statement: Guidelines for Management of Incidental Pulmonary Nodules Detected on CT Images: From the Fleischner Society 2017; Radiology 2017; 284:228-243. 4. Cardiomegaly. 5. Aortic atherosclerosis. Aortic Atherosclerosis (ICD10-I70.0). Electronically Signed   By: Luke Bun M.D.   On: 12/19/2023 22:20   DG CHEST PORT 1 VIEW Result Date: 12/18/2023 EXAM: 1 VIEW(S) XRAY OF THE CHEST 12/18/2023 08:41:00 AM COMPARISON: 12/16/2023 CLINICAL HISTORY: Shortness of breath FINDINGS: LUNGS AND PLEURA: High and low lung volumes. Indistinct left costophrenic angle, cannot exclude trace left pleural fluid. No focal pulmonary opacity. No pulmonary edema. No pneumothorax. HEART  AND MEDIASTINUM: Mild cardiomegaly, stable. Aortic arch atherosclerosis. BONES AND SOFT TISSUES: Thoracic spondylosis. IMPRESSION: 1. No acute cardiopulmonary process identified. 2. Indistinct left costophrenic angle, trace left pleural fluid cannot be excluded. 3. Mild cardiomegaly, stable. 4. Aortic arch atherosclerosis. 5. Thoracic spondylosis. Electronically signed by: Ryan Salvage MD  12/18/2023 06:08 PM EST RP Workstation: [id number]   VAS US  CAROTID Result Date: 12/18/2023 Carotid Arterial Duplex Study Patient Name:  CHANIYAH JAHR [facility]  Date of Exam:   12/17/2023 Medical Rec #: [id number]             Accession #:    [id number] Date of Birth: [birth year]             Patient Gender: F Patient Age:   44 years Exam Location:  [facility] Procedure:      VAS US  CAROTID Referring Phys: EARLE DE LA TORRE --------------------------------------------------------------------------------  Indications:       CVA. Risk Factors:      Hypertension, hyperlipidemia. Comparison Study:  No prior studies. Performing Technologist: Cordella Collet RVT  Examination Guidelines: A complete evaluation includes B-mode imaging, spectral Doppler, color Doppler, and power Doppler as needed of all accessible portions of each vessel. Bilateral testing is considered an integral part of a complete examination. Limited examinations for reoccurring indications may be performed as noted.  Right Carotid Findings: +----------+--------+--------+--------+-----------------------+--------+           PSV cm/sEDV cm/sStenosisPlaque Description     Comments +----------+--------+--------+--------+-----------------------+--------+ CCA Prox  155     15                                              +----------+--------+--------+--------+-----------------------+--------+ CCA Distal131     19              smooth and heterogenous         +----------+--------+--------+--------+-----------------------+--------+ ICA Prox  118     32              smooth and heterogenous         +----------+--------+--------+--------+-----------------------+--------+ ICA Mid   125     35                                              +----------+--------+--------+--------+-----------------------+--------+ ICA Distal160     39                                               +----------+--------+--------+--------+-----------------------+--------+ ECA       189     10                                              +----------+--------+--------+--------+-----------------------+--------+ +----------+--------+-------+--------+-------------------+           PSV cm/sEDV cmsDescribeArm Pressure (mmHG) +----------+--------+-------+--------+-------------------+ Subclavian221                                        +----------+--------+-------+--------+-------------------+ +---------+--------+--+--------+--+---------+  VertebralPSV cm/s73EDV cm/s14Antegrade +---------+--------+--+--------+--+---------+  Left Carotid Findings: +----------+--------+--------+--------+-----------------------+--------+           PSV cm/sEDV cm/sStenosisPlaque Description     Comments +----------+--------+--------+--------+-----------------------+--------+ CCA Prox  161     19              smooth and heterogenous         +----------+--------+--------+--------+-----------------------+--------+ CCA Distal105     19              smooth and heterogenous         +----------+--------+--------+--------+-----------------------+--------+ ICA Prox  87      21              smooth and heterogenous         +----------+--------+--------+--------+-----------------------+--------+ ICA Mid   165     31                                     tortuous +----------+--------+--------+--------+-----------------------+--------+ ICA Distal138     27                                              +----------+--------+--------+--------+-----------------------+--------+ ECA       117     1                                               +----------+--------+--------+--------+-----------------------+--------+ +----------+--------+--------+--------+-------------------+           PSV cm/sEDV cm/sDescribeArm Pressure (mmHG)  +----------+--------+--------+--------+-------------------+ [id number]                                         +----------+--------+--------+--------+-------------------+ +---------+--------+--+--------+--+---------+ VertebralPSV cm/s65EDV cm/s16Antegrade +---------+--------+--+--------+--+---------+   Summary: Right Carotid: Velocities in the right ICA are consistent with a 1-39% stenosis. Left Carotid: Velocities in the left ICA are consistent with a 1-39% stenosis. Vertebrals: Bilateral vertebral arteries demonstrate antegrade flow. *See table(s) above for measurements and observations.  Electronically signed by Eather Popp MD on 12/18/2023 at 11:32:29 AM.    Final    Overnight EEG with video Result Date: 12/18/2023 Shelton Arlin KIDD, MD     12/19/2023  9:07 AM Patient Name: [name] MRN: [id number] Epilepsy Attending: Arlin KIDD Shelton Referring Physician/Provider: Michaela Aisha SQUIBB, MD Duration: 12/17/2023 0951 to 12/18/2023 2130  Patient history: 78 y.o. female past history of anxiety, depression, hypertension, hyperlipidemia, insomnia who presented for evaluation of altered mental status after ingestion of THC. EEG to evaluate for seizure  Level of alertness: Awake, asleep  AEDs during EEG study: LCM  Technical aspects: This EEG study was done with scalp electrodes positioned according to the 10-20 International system of electrode placement. Electrical activity was reviewed with band pass filter of 1-70Hz , sensitivity of 7 uV/mm, display speed of 35mm/sec with a 60Hz  notched filter applied as appropriate. EEG data were recorded continuously and digitally stored.  Video monitoring was available and reviewed as appropriate.  Description: The posterior dominant rhythm consists of 8-9 Hz activity of moderate voltage (25-35 uV) seen predominantly in posterior head regions, symmetric and reactive to eye opening and eye closing.  Sleep was characterized by vertex waves, sleep spindles  (12 to 14 Hz), maximal frontocentral region. Intermittent generalized 3-5hz  theta-delta slowing was also noted. Hyperventilation and photic stimulation were not performed.   Event button was pressed on 12/18/2023 at 1026.  Concomitant EEG before, during and after the event did not show any EEG to suggest seizure. Event button was pressed on 12/18/2023 at 1032, 1400 and 1548 for jerking in lower extremities, staring at times. Concomitant EEG before, during and after the event did not show any EEG to suggest seizure. EEG was disconnected between 12/17/2023 2020 to 12/18/2023 0754 due to technical difficulties ABNORMALITY - Intermittent slow, generalized  IMPRESSION: This study is suggestive of mild generalized cerebral dysfunction ( encephalopathy). No seizures or epileptiform discharges were seen throughout the recording. Event button was pressed on 12/18/2023 at 1026 without concomitant EEG change. This was most likely not an epileptic event. Event button was pressed on 12/18/2023 at 1032, 1400 and 1548 for jerking in lower extremities and staring at times without concomitant EEG change. These were most likely NON epileptic .  A normal interictal EEG does not exclude the diagnosis of epilepsy.   Priyanka O Yadav   VAS US  LOWER EXTREMITY VENOUS (DVT) Result Date: 12/17/2023  Lower Venous DVT Study Patient Name:  [name] [facility]  Date of Exam:   12/17/2023 Medical Rec #: [id number]             Accession #:    [id number] Date of Birth: 11-20-1945             Patient Gender: F Patient Age:   36 years Exam Location:  [facility] Procedure:      VAS US  LOWER EXTREMITY VENOUS (DVT) Referring Phys: EKTA PATEL --------------------------------------------------------------------------------  Indications: Positive D dimer [320372].  Risk Factors: None identified. Limitations: Poor ultrasound/tissue interface and body habitus. Comparison Study: No prior studies. Performing Technologist: Cordella Collet RVT  Examination  Guidelines: A complete evaluation includes B-mode imaging, spectral Doppler, color Doppler, and power Doppler as needed of all accessible portions of each vessel. Bilateral testing is considered an integral part of a complete examination. Limited examinations for reoccurring indications may be performed as noted. The reflux portion of the exam is performed with the patient in reverse Trendelenburg.  +---------+---------------+---------+-----------+----------+-------------------+ RIGHT    CompressibilityPhasicitySpontaneityPropertiesThrombus Aging      +---------+---------------+---------+-----------+----------+-------------------+ CFV      Full           Yes      Yes                                      +---------+---------------+---------+-----------+----------+-------------------+ SFJ      Full                                                             +---------+---------------+---------+-----------+----------+-------------------+ FV Prox  Full                                                             +---------+---------------+---------+-----------+----------+-------------------+ FV  Mid   Full                                                             +---------+---------------+---------+-----------+----------+-------------------+ FV DistalFull                                                             +---------+---------------+---------+-----------+----------+-------------------+ PFV      Full                                                             +---------+---------------+---------+-----------+----------+-------------------+ POP      Full           Yes      Yes                                      +---------+---------------+---------+-----------+----------+-------------------+ PTV      Full                                                             +---------+---------------+---------+-----------+----------+-------------------+ PERO                                                   Not well visualized +---------+---------------+---------+-----------+----------+-------------------+   +---------+---------------+---------+-----------+----------+-------------------+ LEFT     CompressibilityPhasicitySpontaneityPropertiesThrombus Aging      +---------+---------------+---------+-----------+----------+-------------------+ CFV      Full           Yes      Yes                                      +---------+---------------+---------+-----------+----------+-------------------+ SFJ      Full                                                             +---------+---------------+---------+-----------+----------+-------------------+ FV Prox  Full                                                             +---------+---------------+---------+-----------+----------+-------------------+ FV Mid   Full                                                             +---------+---------------+---------+-----------+----------+-------------------+  FV DistalFull                                                             +---------+---------------+---------+-----------+----------+-------------------+ PFV      Full                                                             +---------+---------------+---------+-----------+----------+-------------------+ POP      Full           Yes      Yes                                      +---------+---------------+---------+-----------+----------+-------------------+ PTV      Full                                                             +---------+---------------+---------+-----------+----------+-------------------+ PERO                                                  Not well visualized +---------+---------------+---------+-----------+----------+-------------------+     Summary: RIGHT: - There is no evidence of deep vein thrombosis in the lower extremity.  However, portions of this examination were limited- see technologist comments above.  - No cystic structure found in the popliteal fossa.  LEFT: - There is no evidence of deep vein thrombosis in the lower extremity. However, portions of this examination were limited- see technologist comments above.  - No cystic structure found in the popliteal fossa.  *See table(s) above for measurements and observations. Electronically signed by Lonni Gaskins MD on 12/17/2023 at 1:11:50 PM.    Final    ECHOCARDIOGRAM COMPLETE Result Date: 12/17/2023    ECHOCARDIOGRAM REPORT   Patient Name:   [name] [facility] Date of Exam: 12/17/2023 Medical Rec #:  [id number]            Height:       62.0 in Accession #:    [id number]           Weight:       180.0 lb Date of Birth:  Apr 25, 1945            BSA:          1.828 m Patient Age:    78 years             BP:           114/49 mmHg Patient Gender: F                    HR:           74 bpm. Exam Location:  Inpatient Procedure: 2D Echo (Both Spectral and Color Flow Doppler were  utilized during            procedure). Indications:    TIA/dizziness  History:        Patient has prior history of Echocardiogram examinations. Risk                 Factors:Hypertension.  Sonographer:    Charmaine Gaskins Referring Phys: [phone] EKTA V PATEL  Sonographer Comments: Technically challenging study due to limited acoustic windows. Image acquisition challenging due to uncooperative patient. IMPRESSIONS  1. Left ventricular ejection fraction, by estimation, is 65 to 70%. The left ventricle has normal function. The left ventricle has no regional wall motion abnormalities. Left ventricular diastolic parameters are consistent with Grade I diastolic dysfunction (impaired relaxation).  2. Right ventricular systolic function is normal. The right ventricular size is normal.  3. The mitral valve is normal in structure. No evidence of mitral valve regurgitation. No evidence of mitral stenosis.  4. The aortic valve  is normal in structure. There is moderate calcification of the aortic valve. Aortic valve regurgitation is not visualized. No aortic stenosis is present.  5. The inferior vena cava is normal in size with greater than 50% respiratory variability, suggesting right atrial pressure of 3 mmHg. FINDINGS  Left Ventricle: Left ventricular ejection fraction, by estimation, is 65 to 70%. The left ventricle has normal function. The left ventricle has no regional wall motion abnormalities. The left ventricular internal cavity size was normal in size. There is  no left ventricular hypertrophy. Left ventricular diastolic parameters are consistent with Grade I diastolic dysfunction (impaired relaxation). Right Ventricle: The right ventricular size is normal. No increase in right ventricular wall thickness. Right ventricular systolic function is normal. Left Atrium: Left atrial size was normal in size. Right Atrium: Right atrial size was normal in size. Pericardium: There is no evidence of pericardial effusion. Mitral Valve: The mitral valve is normal in structure. No evidence of mitral valve regurgitation. No evidence of mitral valve stenosis. Tricuspid Valve: The tricuspid valve is normal in structure. Tricuspid valve regurgitation is trivial. No evidence of tricuspid stenosis. Aortic Valve: The aortic valve is normal in structure. There is moderate calcification of the aortic valve. Aortic valve regurgitation is not visualized. No aortic stenosis is present. Pulmonic Valve: The pulmonic valve was not well visualized. Pulmonic valve regurgitation is not visualized. No evidence of pulmonic stenosis. Aorta: The aortic root is normal in size and structure. Venous: The inferior vena cava is normal in size with greater than 50% respiratory variability, suggesting right atrial pressure of 3 mmHg. IAS/Shunts: No atrial level shunt detected by color flow Doppler.  LEFT VENTRICLE PLAX 2D LVIDd:         4.70 cm   Diastology LVIDs:          2.10 cm   LV e' medial:    7.51 cm/s LV PW:         1.00 cm   LV E/e' medial:  12.3 LV IVS:        0.80 cm   LV e' lateral:   6.42 cm/s LVOT diam:     1.90 cm   LV E/e' lateral: 14.4 LVOT Area:     2.84 cm  RIGHT VENTRICLE RV Basal diam:  3.30 cm RV Mid diam:    2.90 cm RV S prime:     13.20 cm/s LEFT ATRIUM             Index        RIGHT ATRIUM  Index LA diam:        3.10 cm 1.70 cm/m   RA Area:     11.90 cm LA Vol (A2C):   37.1 ml 20.30 ml/m  RA Volume:   27.30 ml  14.93 ml/m LA Vol (A4C):   47.6 ml 26.04 ml/m LA Biplane Vol: 45.5 ml 24.89 ml/m   AORTA Ao Root diam: 2.40 cm Ao Asc diam:  2.60 cm MITRAL VALVE MV Area (PHT): 4.36 cm     SHUNTS MV Decel Time: 174 msec     Systemic Diam: 1.90 cm MV E velocity: 92.50 cm/s MV A velocity: 123.00 cm/s MV E/A ratio:  0.75 Franck Azobou Tonleu Electronically signed by Joelle Cedars Tonleu Signature Date/Time: 12/17/2023/10:23:29 AM    Final    MR ANGIO HEAD WO CONTRAST Result Date: 12/17/2023 CLINICAL DATA:  Initial evaluation for acute neuro deficit, stroke suspected. EXAM: MRA HEAD WITHOUT CONTRAST TECHNIQUE: Angiographic images of the Circle of Willis were acquired using MRA technique without intravenous contrast. COMPARISON:  Comparison made with prior brain MRI from 12/16/2023. FINDINGS: Anterior circulation: Both internal carotid arteries are widely patent through the siphons without stenosis or other abnormality. Small funnel shaped outpouching extending laterally from the cavernous right ICA noted, consistent with a small vascular infundibulum (series 9, image 61). A1 segments patent bilaterally. Normal anterior communicating artery complex. Anterior cerebral arteries patent without stenosis. No M1 stenosis or occlusion. No proximal MCA branch occlusion or high-grade stenosis. Distal MCA branches perfused and symmetric. Posterior circulation: Visualized distal right V4 segment is patent without stenosis. Right vertebral artery dominant. Partially  visualized distal left V4 segment is markedly hypoplastic but grossly patent as well. Left PICA patent at its takeoff. Right PICA origin not seen. Basilar patent without stenosis. Superior cerebral arteries patent bilaterally. Both PCA supplied via hypoplastic P1 segments and robust bilateral posterior communicating arteries. Both PCAs patent to their distal aspects without stenosis. Anatomic variants: As above. Other: No intracranial aneurysm. IMPRESSION: Normal intracranial MRA. No large vessel occlusion or other vascular abnormality. No hemodynamically significant or correctable stenosis. Electronically Signed   By: Morene Hoard M.D.   On: 12/17/2023 02:36   MR BRAIN WO CONTRAST Result Date: 12/16/2023 EXAM: MRI BRAIN WITHOUT CONTRAST 12/16/2023 05:50:56 PM TECHNIQUE: Multiplanar multisequence MRI of the head/brain was performed without the administration of intravenous contrast. COMPARISON: Compared to CT from earlier the same day. CLINICAL HISTORY: FINDINGS: BRAIN AND VENTRICLES: Punctate 3 mm focus of restricted diffusion seen involving the mesial right temporal lobe/hippocampal formation (series 5, 67). No associated hemorrhage. No mass. No midline shift. No hydrocephalus. The sella is unremarkable. Normal flow voids. ORBITS: Prior bilateral ocular lens replacement. SINUSES AND MASTOIDS: No acute abnormality. BONES AND SOFT TISSUES: Normal marrow signal. No acute soft tissue abnormality. IMPRESSION: 1. Punctate 3 mm focus of restricted diffusion involving the mesial right temporal lobe/hippocampal formation. No associated hemorrhage. Primary differential considerations include a punctate acute ischemic nonhemorrhagic infarct versus changes of transient global amnesia. 2. Otherwise essentially normal brain MRI for age. Electronically signed by: Morene Hoard MD 12/16/2023 06:35 PM EST RP Workstation: [id number]   DG Hip Unilat W or Wo Pelvis 2-3 Views Right Result Date: 12/16/2023 EXAM: 2  or 3 VIEW(S) XRAY OF THE RIGHT HIP 12/16/2023 01:31:00 PM COMPARISON: CT abdomen and pelvis 02/05/2022. CLINICAL HISTORY: pain bruising after fall FINDINGS: BONES AND JOINTS: No acute fracture or focal osseous lesion is identified in the right hip. The right hip joint is maintained. No significant degenerative changes are  seen in the right hip. There is a small sclerotic lesion in the left femoral head, most likely a bone island. SOFT TISSUES: The soft tissues are unremarkable. IMPRESSION: 1. No acute fracture or dislocation. Electronically signed by: Greig Pique MD 12/16/2023 01:45 PM EST RP Workstation: [id number]   DG Knee Complete 4 Views Right Result Date: 12/16/2023 EXAM: 4 VIEW(S) XRAY OF THE RIGHT KNEE 12/16/2023 01:31:00 PM COMPARISON: None available. CLINICAL HISTORY: pain bruising after fall FINDINGS: BONES AND JOINTS: Right knee total arthroplasty is in anatomic alignment. No evidence for hardware loosening. No acute fracture. No focal osseous lesion. No joint dislocation. No significant joint effusion. SOFT TISSUES: The soft tissues are unremarkable. IMPRESSION: 1. No acute findings. 2. Right knee total arthroplasty in anatomic alignment without evidence of hardware loosening. Electronically signed by: Greig Pique MD 12/16/2023 01:44 PM EST RP Workstation: [id number]   DG Ribs Unilateral Left Result Date: 12/16/2023 EXAM: 2 VIEW(S) XRAY OF THE LEFT RIBS 12/16/2023 01:31:00 PM COMPARISON: None available. CLINICAL HISTORY: [id number] Pain 144615; 190176 Fall 190176 Pain 144615; 190176 Fall 190176 FINDINGS: BONES: No acute displaced rib fracture. LUNGS AND PLEURA: Visualized lungs demonstrate no acute abnormality. IMPRESSION: 1. No acute displaced rib fracture. Electronically signed by: Greig Pique MD 12/16/2023 01:42 PM EST RP Workstation: [id number]   DG Shoulder Left Result Date: 12/16/2023 EXAM: 1 VIEW XRAY OF THE LEFT SHOULDER 12/16/2023 01:31:00 PM COMPARISON: None available. CLINICAL  HISTORY: Pain bruising after fall. FINDINGS: BONES AND JOINTS: Glenohumeral joint is normally aligned. No acute fracture or dislocation. The [facility] joint is unremarkable in appearance. SOFT TISSUES: No abnormal calcifications. Visualized lung is unremarkable. IMPRESSION: 1. No significant abnormality. Electronically signed by: Greig Pique MD 12/16/2023 01:41 PM EST RP Workstation: [id number]   DG Chest 2 View Result Date: 12/16/2023 EXAM: 2 VIEW(S) XRAY OF THE CHEST 12/16/2023 01:31:00 PM COMPARISON: Chest X-ray 10/17/2023. CLINICAL HISTORY: SOB SOB FINDINGS: LUNGS AND PLEURA: No focal pulmonary opacity. No pulmonary edema. No pleural effusion. No pneumothorax. HEART AND MEDIASTINUM: No acute abnormality of the cardiac and mediastinal silhouettes. BONES AND SOFT TISSUES: No acute osseous abnormality. IMPRESSION: 1. No acute process. Electronically signed by: Greig Pique MD 12/16/2023 01:40 PM EST RP Workstation: [id number]   CT Cervical Spine Wo Contrast Result Date: 12/16/2023 EXAM: CT CERVICAL SPINE WITHOUT CONTRAST 12/16/2023 11:39:12 AM TECHNIQUE: CT of the cervical spine was performed without the administration of intravenous contrast. Multiplanar reformatted images are provided for review. Automated exposure control, iterative reconstruction, and/or weight based adjustment of the mA/kV was utilized to reduce the radiation dose to as low as reasonably achievable. COMPARISON: None available. CLINICAL HISTORY: Neck trauma (Age >= 65y) FINDINGS: CERVICAL SPINE: BONES AND ALIGNMENT: There is no evidence of fracture or acute traumatic injury. DEGENERATIVE CHANGES: There is slight degenerative anterolisthesis at C3-C4. There is moderate disc space narrowing and mild endplate ridging at C4-C5, C5-C6, and C6-C7. SOFT TISSUES: No prevertebral soft tissue swelling. The paraspinous soft tissues are unremarkable. IMPRESSION: 1. No acute abnormality of the cervical spine. 2. Slight degenerative anterolisthesis at C3-4.  3. Moderate disc space narrowing and mild endplate ridging at C4-5, C5-6, and C6-7. Electronically signed by: Evalene Coho MD 12/16/2023 11:45 AM EST RP Workstation: [id number]   CT HEAD WO CONTRAST Result Date: 12/16/2023 EXAM: CT HEAD WITHOUT CONTRAST 12/16/2023 11:39:12 AM TECHNIQUE: CT of the head was performed without the administration of intravenous contrast. Automated exposure control, iterative reconstruction, and/or weight based adjustment of the mA/kV was utilized to reduce the radiation dose to as  low as reasonably achievable. COMPARISON: 10/17/2023 CLINICAL HISTORY: Neuro deficit, acute, stroke suspected. FINDINGS: BRAIN AND VENTRICLES: No acute hemorrhage. No evidence of acute infarct. No hydrocephalus. No extra-axial collection. No mass effect or midline shift. VASCULATURE: Atherosclerosis of skullbase vasculature without hyperdense vessel or abnormal calcification. ORBITS: Bilateral cataract resection. SINUSES: No acute abnormality. SOFT TISSUES AND SKULL: No acute soft tissue abnormality. No skull fracture. Frontal hyperostosis. IMPRESSION: 1. No acute intracranial abnormality related to the suspected stroke. Electronically signed by: Evalene Coho MD 12/16/2023 11:43 AM EST RP Workstation: [id number]   Subjective: Patient was seen and examined at bedside.  Overnight events noted. Patient reports doing much better. She is being discharged to acute inpatient rehab.  Discharge Exam: Vitals:   12/23/23 0350 12/23/23 0809  BP: (!) 148/65 100/61  Pulse: 89 98  Resp: 17 16  Temp: 98.2 F (36.8 C) 97.7 F (36.5 C)  SpO2: 97% 100%   Vitals:   12/23/23 0025 12/23/23 0350 12/23/23 0500 12/23/23 0809  BP: (!) 155/63 (!) 148/65  100/61  Pulse: 89 89  98  Resp: 19 17  16   Temp: 98.9 F (37.2 C) 98.2 F (36.8 C)  97.7 F (36.5 C)  TempSrc: Oral Oral  Oral  SpO2: 98% 97%  100%  Weight:   83.2 kg   Height:        General: Pt is alert, awake, not in acute  distress Cardiovascular: RRR, S1/S2 +, no rubs, no gallops Respiratory: CTA bilaterally, no wheezing, no rhonchi Abdominal: Soft, NT, ND, bowel sounds + Extremities: no edema, no cyanosis    The results of significant diagnostics from this hospitalization (including imaging, microbiology, ancillary and laboratory) are listed below for reference.     Microbiology: Recent Results (from the past 240 hours)  Gastrointestinal Panel by PCR , Stool     Status: None   Collection Time: 12/16/23  2:01 PM   Specimen: Rectum; Stool  Result Value Ref Range Status   Campylobacter species NOT DETECTED NOT DETECTED Final   Plesimonas shigelloides NOT DETECTED NOT DETECTED Final   Salmonella species NOT DETECTED NOT DETECTED Final   Yersinia enterocolitica NOT DETECTED NOT DETECTED Final   Vibrio species NOT DETECTED NOT DETECTED Final   Vibrio cholerae NOT DETECTED NOT DETECTED Final   Enteroaggregative E coli (EAEC) NOT DETECTED NOT DETECTED Final   Enteropathogenic E coli (EPEC) NOT DETECTED NOT DETECTED Final   Enterotoxigenic E coli (ETEC) NOT DETECTED NOT DETECTED Final   Shiga like toxin producing E coli (STEC) NOT DETECTED NOT DETECTED Final   Shigella/Enteroinvasive E coli (EIEC) NOT DETECTED NOT DETECTED Final   Cryptosporidium NOT DETECTED NOT DETECTED Final   Cyclospora cayetanensis NOT DETECTED NOT DETECTED Final   Entamoeba histolytica NOT DETECTED NOT DETECTED Final   Giardia lamblia NOT DETECTED NOT DETECTED Final   Adenovirus F40/41 NOT DETECTED NOT DETECTED Final   Astrovirus NOT DETECTED NOT DETECTED Final   Norovirus GI/GII NOT DETECTED NOT DETECTED Final   Rotavirus A NOT DETECTED NOT DETECTED Final   Sapovirus (I, II, IV, and V) NOT DETECTED NOT DETECTED Final    Comment: Performed at [facility], [address] [place], [place] [address]  Blood culture (routine x 2)     Status: Abnormal   Collection Time: 12/16/23  8:25 PM   Specimen: BLOOD  Result Value Ref  Range Status   Specimen Description BLOOD LEFT ANTECUBITAL  Final   Special Requests   Final    BOTTLES DRAWN AEROBIC  AND ANAEROBIC Blood Culture results may not be optimal due to an inadequate volume of blood received in culture bottles   Culture  Setup Time   Final    GRAM POSITIVE COCCI IN BOTH AEROBIC AND ANAEROBIC BOTTLES CRITICAL RESULT CALLED TO, READ BACK BY AND VERIFIED WITH: PHARMD C. AMEND [id number] AT 1650, ADC Performed at [facility], [address] [place], [place] [address]    Culture STAPHYLOCOCCUS EPIDERMIDIS (A)  Final   Report Status 12/19/2023 FINAL  Final   Organism ID, Bacteria STAPHYLOCOCCUS EPIDERMIDIS  Final      Susceptibility   Staphylococcus epidermidis - MIC*    CIPROFLOXACIN <=0.5 SENSITIVE Sensitive     ERYTHROMYCIN >=8 RESISTANT Resistant     GENTAMICIN <=0.5 SENSITIVE Sensitive     OXACILLIN >=4 RESISTANT Resistant     TETRACYCLINE >=16 RESISTANT Resistant     VANCOMYCIN  2 SENSITIVE Sensitive     TRIMETH/SULFA 160 RESISTANT Resistant     CLINDAMYCIN <=0.25 SENSITIVE Sensitive     RIFAMPIN <=0.5 SENSITIVE Sensitive     Inducible Clindamycin NEGATIVE Sensitive     * STAPHYLOCOCCUS EPIDERMIDIS  Blood Culture ID Panel (Reflexed)     Status: Abnormal   Collection Time: 12/16/23  8:25 PM  Result Value Ref Range Status   Enterococcus faecalis NOT DETECTED NOT DETECTED Final   Enterococcus Faecium NOT DETECTED NOT DETECTED Final   Listeria monocytogenes NOT DETECTED NOT DETECTED Final   Staphylococcus species DETECTED (A) NOT DETECTED Final    Comment: CRITICAL RESULT CALLED TO, READ BACK BY AND VERIFIED WITH: PHARMD C. AMEND [id number] AT 1650, ADC    Staphylococcus aureus (BCID) NOT DETECTED NOT DETECTED Final   Staphylococcus epidermidis DETECTED (A) NOT DETECTED Final    Comment: Methicillin (oxacillin) resistant coagulase negative staphylococcus. Possible blood culture contaminant (unless isolated from more than one blood culture draw or clinical  case suggests pathogenicity). No antibiotic treatment is indicated for blood  culture contaminants. CRITICAL RESULT CALLED TO, READ BACK BY AND VERIFIED WITH: PHARMD C. AMEND [id number] AT 1650, ADC    Staphylococcus lugdunensis NOT DETECTED NOT DETECTED Final   Streptococcus species NOT DETECTED NOT DETECTED Final   Streptococcus agalactiae NOT DETECTED NOT DETECTED Final   Streptococcus pneumoniae NOT DETECTED NOT DETECTED Final   Streptococcus pyogenes NOT DETECTED NOT DETECTED Final   A.calcoaceticus-baumannii NOT DETECTED NOT DETECTED Final   Bacteroides fragilis NOT DETECTED NOT DETECTED Final   Enterobacterales NOT DETECTED NOT DETECTED Final   Enterobacter cloacae complex NOT DETECTED NOT DETECTED Final   Escherichia coli NOT DETECTED NOT DETECTED Final   Klebsiella aerogenes NOT DETECTED NOT DETECTED Final   Klebsiella oxytoca NOT DETECTED NOT DETECTED Final   Klebsiella pneumoniae NOT DETECTED NOT DETECTED Final   Proteus species NOT DETECTED NOT DETECTED Final   Salmonella species NOT DETECTED NOT DETECTED Final   Serratia marcescens NOT DETECTED NOT DETECTED Final   Haemophilus influenzae NOT DETECTED NOT DETECTED Final   Neisseria meningitidis NOT DETECTED NOT DETECTED Final   Pseudomonas aeruginosa NOT DETECTED NOT DETECTED Final   Stenotrophomonas maltophilia NOT DETECTED NOT DETECTED Final   Candida albicans NOT DETECTED NOT DETECTED Final   Candida auris NOT DETECTED NOT DETECTED Final   Candida glabrata NOT DETECTED NOT DETECTED Final   Candida krusei NOT DETECTED NOT DETECTED Final   Candida parapsilosis NOT DETECTED NOT DETECTED Final   Candida tropicalis NOT DETECTED NOT DETECTED Final   Cryptococcus neoformans/gattii NOT DETECTED NOT DETECTED Final   Methicillin  resistance mecA/C DETECTED (A) NOT DETECTED Final    Comment: CRITICAL RESULT CALLED TO, READ BACK BY AND VERIFIED WITH: PHARMD C. AMEND [id number] AT 1650, ADC Performed at [facility], [address] [place], [place] [address]   Blood culture (routine x 2)     Status: Abnormal   Collection Time: 12/16/23  8:30 PM   Specimen: BLOOD LEFT FOREARM  Result Value Ref Range Status   Specimen Description BLOOD LEFT FOREARM  Final   Special Requests   Final    BOTTLES DRAWN AEROBIC AND ANAEROBIC Blood Culture results may not be optimal due to an inadequate volume of blood received in culture bottles   Culture  Setup Time   Final    GRAM POSITIVE COCCI IN CLUSTERS AEROBIC BOTTLE ONLY CRITICAL VALUE NOTED.  VALUE IS CONSISTENT WITH PREVIOUSLY REPORTED AND CALLED VALUE.    Culture (A)  Final    STAPHYLOCOCCUS EPIDERMIDIS SUSCEPTIBILITIES PERFORMED ON PREVIOUS CULTURE WITHIN THE LAST 5 DAYS. Performed at [facility], [address] [place], [place] [address]    Report Status 12/19/2023 FINAL  Final     Labs: BNP (last 3 results) Recent Labs    12/25/22 1347  BNP 76.9   Basic Metabolic Panel: Recent Labs  Lab 12/16/23 2027 12/17/23 0602 12/18/23 0255 12/19/23 0137 12/21/23 0117 12/22/23 0239  NA 135 138 140 141 140 141  K 3.0* 3.4* 3.3* 3.4* 3.4* 3.3*  CL 100 102 105 105 104 106  CO2 19* 22 20* 22 22 20*  GLUCOSE 105* 121* 133* 116* 93 98  BUN 24* 22 24* 28* 21 17  CREATININE 1.75* 1.37* 1.15* 0.97 1.05* 0.93  CALCIUM  8.4* 8.5* 8.6* 8.5* 8.3* 8.7*  MG 1.9  --  2.0 1.9 1.5* 1.8  PHOS  --   --  3.1 2.5 2.7 3.1   Liver Function Tests: No results for input(s): AST, ALT, ALKPHOS, BILITOT, PROT, ALBUMIN in the last 168 hours. No results for input(s): LIPASE, AMYLASE in the last 168 hours. Recent Labs  Lab 12/17/23 0602  AMMONIA 36*   CBC: Recent Labs  Lab 12/16/23 2112 12/18/23 0255 12/19/23 0137 12/21/23 0117 12/22/23 0239  WBC  --  10.4 8.8 10.8* 9.7  HGB 10.2* 10.8* 9.9* 10.4* 10.8*  HCT 31.5* 33.5* 30.6* 32.3* 32.6*  MCV  --  84.4 84.8 84.8 83.8  PLT  --  454* 378 411* 440*   Cardiac Enzymes: Recent Labs  Lab 12/16/23 1658   CKTOTAL 230   BNP: Invalid input(s): POCBNP CBG: Recent Labs  Lab 12/18/23 1147 12/18/23 1626 12/19/23 0610 12/21/23 0551 12/22/23 0427  GLUCAP 114* 117* 94 106* 107*   D-Dimer No results for input(s): DDIMER in the last 72 hours. Hgb A1c No results for input(s): HGBA1C in the last 72 hours. Lipid Profile No results for input(s): CHOL, HDL, LDLCALC, TRIG, CHOLHDL, LDLDIRECT in the last 72 hours. Thyroid  function studies No results for input(s): TSH, T4TOTAL, T3FREE, THYROIDAB in the last 72 hours.  Invalid input(s): FREET3 Anemia work up No results for input(s): VITAMINB12, FOLATE, FERRITIN, TIBC, IRON, RETICCTPCT in the last 72 hours. Urinalysis    Component Value Date/Time   COLORURINE STRAW (A) 12/17/2023 0602   APPEARANCEUR CLEAR 12/17/2023 0602   LABSPEC 1.012 12/17/2023 0602   PHURINE 6.0 12/17/2023 0602   GLUCOSEU NEGATIVE 12/17/2023 0602   HGBUR NEGATIVE 12/17/2023 0602   BILIRUBINUR NEGATIVE 12/17/2023 0602   KETONESUR 5 (A) 12/17/2023 0602   PROTEINUR  NEGATIVE 12/17/2023 0602   NITRITE NEGATIVE 12/17/2023 0602   LEUKOCYTESUR TRACE (A) 12/17/2023 0602   Sepsis Labs Recent Labs  Lab 12/18/23 0255 12/19/23 0137 12/21/23 0117 12/22/23 0239  WBC 10.4 8.8 10.8* 9.7   Microbiology Recent Results (from the past 240 hours)  Gastrointestinal Panel by PCR , Stool     Status: None   Collection Time: 12/16/23  2:01 PM   Specimen: Rectum; Stool  Result Value Ref Range Status   Campylobacter species NOT DETECTED NOT DETECTED Final   Plesimonas shigelloides NOT DETECTED NOT DETECTED Final   Salmonella species NOT DETECTED NOT DETECTED Final   Yersinia enterocolitica NOT DETECTED NOT DETECTED Final   Vibrio species NOT DETECTED NOT DETECTED Final   Vibrio cholerae NOT DETECTED NOT DETECTED Final   Enteroaggregative E coli (EAEC) NOT DETECTED NOT DETECTED Final   Enteropathogenic E coli (EPEC) NOT DETECTED NOT DETECTED  Final   Enterotoxigenic E coli (ETEC) NOT DETECTED NOT DETECTED Final   Shiga like toxin producing E coli (STEC) NOT DETECTED NOT DETECTED Final   Shigella/Enteroinvasive E coli (EIEC) NOT DETECTED NOT DETECTED Final   Cryptosporidium NOT DETECTED NOT DETECTED Final   Cyclospora cayetanensis NOT DETECTED NOT DETECTED Final   Entamoeba histolytica NOT DETECTED NOT DETECTED Final   Giardia lamblia NOT DETECTED NOT DETECTED Final   Adenovirus F40/41 NOT DETECTED NOT DETECTED Final   Astrovirus NOT DETECTED NOT DETECTED Final   Norovirus GI/GII NOT DETECTED NOT DETECTED Final   Rotavirus A NOT DETECTED NOT DETECTED Final   Sapovirus (I, II, IV, and V) NOT DETECTED NOT DETECTED Final    Comment: Performed at [facility], [address] [place], [place] [address]  Blood culture (routine x 2)     Status: Abnormal   Collection Time: 12/16/23  8:25 PM   Specimen: BLOOD  Result Value Ref Range Status   Specimen Description BLOOD LEFT ANTECUBITAL  Final   Special Requests   Final    BOTTLES DRAWN AEROBIC AND ANAEROBIC Blood Culture results may not be optimal due to an inadequate volume of blood received in culture bottles   Culture  Setup Time   Final    GRAM POSITIVE COCCI IN BOTH AEROBIC AND ANAEROBIC BOTTLES CRITICAL RESULT CALLED TO, READ BACK BY AND VERIFIED WITH: PHARMD C. AMEND [id number] AT 1650, ADC Performed at [facility], [address] [place], [place] [address]    Culture STAPHYLOCOCCUS EPIDERMIDIS (A)  Final   Report Status 12/19/2023 FINAL  Final   Organism ID, Bacteria STAPHYLOCOCCUS EPIDERMIDIS  Final      Susceptibility   Staphylococcus epidermidis - MIC*    CIPROFLOXACIN <=0.5 SENSITIVE Sensitive     ERYTHROMYCIN >=8 RESISTANT Resistant     GENTAMICIN <=0.5 SENSITIVE Sensitive     OXACILLIN >=4 RESISTANT Resistant     TETRACYCLINE >=16 RESISTANT Resistant     VANCOMYCIN  2 SENSITIVE Sensitive     TRIMETH/SULFA 160 RESISTANT Resistant     CLINDAMYCIN  <=0.25 SENSITIVE Sensitive     RIFAMPIN <=0.5 SENSITIVE Sensitive     Inducible Clindamycin NEGATIVE Sensitive     * STAPHYLOCOCCUS EPIDERMIDIS  Blood Culture ID Panel (Reflexed)     Status: Abnormal   Collection Time: 12/16/23  8:25 PM  Result Value Ref Range Status   Enterococcus faecalis NOT DETECTED NOT DETECTED Final   Enterococcus Faecium NOT DETECTED NOT DETECTED Final   Listeria monocytogenes NOT DETECTED NOT DETECTED Final   Staphylococcus species DETECTED (A) NOT  DETECTED Final    Comment: CRITICAL RESULT CALLED TO, READ BACK BY AND VERIFIED WITH: PHARMD C. AMEND [id number] AT 1650, ADC    Staphylococcus aureus (BCID) NOT DETECTED NOT DETECTED Final   Staphylococcus epidermidis DETECTED (A) NOT DETECTED Final    Comment: Methicillin (oxacillin) resistant coagulase negative staphylococcus. Possible blood culture contaminant (unless isolated from more than one blood culture draw or clinical case suggests pathogenicity). No antibiotic treatment is indicated for blood  culture contaminants. CRITICAL RESULT CALLED TO, READ BACK BY AND VERIFIED WITH: PHARMD C. AMEND [id number] AT 1650, ADC    Staphylococcus lugdunensis NOT DETECTED NOT DETECTED Final   Streptococcus species NOT DETECTED NOT DETECTED Final   Streptococcus agalactiae NOT DETECTED NOT DETECTED Final   Streptococcus pneumoniae NOT DETECTED NOT DETECTED Final   Streptococcus pyogenes NOT DETECTED NOT DETECTED Final   A.calcoaceticus-baumannii NOT DETECTED NOT DETECTED Final   Bacteroides fragilis NOT DETECTED NOT DETECTED Final   Enterobacterales NOT DETECTED NOT DETECTED Final   Enterobacter cloacae complex NOT DETECTED NOT DETECTED Final   Escherichia coli NOT DETECTED NOT DETECTED Final   Klebsiella aerogenes NOT DETECTED NOT DETECTED Final   Klebsiella oxytoca NOT DETECTED NOT DETECTED Final   Klebsiella pneumoniae NOT DETECTED NOT DETECTED Final   Proteus species NOT DETECTED NOT DETECTED Final   Salmonella species NOT  DETECTED NOT DETECTED Final   Serratia marcescens NOT DETECTED NOT DETECTED Final   Haemophilus influenzae NOT DETECTED NOT DETECTED Final   Neisseria meningitidis NOT DETECTED NOT DETECTED Final   Pseudomonas aeruginosa NOT DETECTED NOT DETECTED Final   Stenotrophomonas maltophilia NOT DETECTED NOT DETECTED Final   Candida albicans NOT DETECTED NOT DETECTED Final   Candida auris NOT DETECTED NOT DETECTED Final   Candida glabrata NOT DETECTED NOT DETECTED Final   Candida krusei NOT DETECTED NOT DETECTED Final   Candida parapsilosis NOT DETECTED NOT DETECTED Final   Candida tropicalis NOT DETECTED NOT DETECTED Final   Cryptococcus neoformans/gattii NOT DETECTED NOT DETECTED Final   Methicillin resistance mecA/C DETECTED (A) NOT DETECTED Final    Comment: CRITICAL RESULT CALLED TO, READ BACK BY AND VERIFIED WITH: PHARMD C. AMEND [id number] AT 1650, ADC Performed at [facility], [address] [place], [place] [address]   Blood culture (routine x 2)     Status: Abnormal   Collection Time: 12/16/23  8:30 PM   Specimen: BLOOD LEFT FOREARM  Result Value Ref Range Status   Specimen Description BLOOD LEFT FOREARM  Final   Special Requests   Final    BOTTLES DRAWN AEROBIC AND ANAEROBIC Blood Culture results may not be optimal due to an inadequate volume of blood received in culture bottles   Culture  Setup Time   Final    GRAM POSITIVE COCCI IN CLUSTERS AEROBIC BOTTLE ONLY CRITICAL VALUE NOTED.  VALUE IS CONSISTENT WITH PREVIOUSLY REPORTED AND CALLED VALUE.    Culture (A)  Final    STAPHYLOCOCCUS EPIDERMIDIS SUSCEPTIBILITIES PERFORMED ON PREVIOUS CULTURE WITHIN THE LAST 5 DAYS. Performed at [facility], [address] [place], [place] [address]    Report Status 12/19/2023 FINAL  Final     Time coordinating discharge: Over 30 minutes  SIGNED:   Darcel Dawley, MD  [facility] 12/23/2023, 12:03 PM Pager   If 7PM-7AM, please contact night-coverage

## 2023-12-23 NOTE — H&P (Signed)
 Physical Medicine and Rehabilitation Admission H&P    CC: debility 2/2 hypovolemia and dehydration  HPI: [name] is a 78 year old woman with a PMH of HTN, HLD, osteopenia, seizures, and CKD. She was hospitalized in September with AMS. She presented to [facility] on 12/16/23 with falls and slurred speech. She had hit her head and reported nausea and diarrhea. CT head was negative for acute abnormalities. CXR revealed mild cardiomegaly with no acute infiltrates. C-spine, right knee, right rib, and left shoulder Xrs have been negative. UA was WNL. MRI showed possible punctate stroke MRA negative for LVO. LTM EEG on 11/4 is negative for seizures. LTM EEG on 11/5 was also negative for seizures. Patient is being admitted to CIR for impaired mobility and ADLs.  ROS +diffuse generalized weakness Past Medical History:  Diagnosis Date   Allergy 1980's   Sulfa drugs and penecillin   Anxiety    Cataract 2021   Corrected   Chronic kidney disease 2024   Stage 3   Depression    Headache    SINUS   Hyperlipidemia    Hypertension 2021   Osteopenia    Osteopenia    Primary localized osteoarthritis of right knee    Seizures (HCC) 10/2023   Past Surgical History:  Procedure Laterality Date   BREAST EXCISIONAL BIOPSY Right    benign cyst removal    CARPAL TUNNEL RELEASE Right 2015   Ortho in [place]   CHOLECYSTECTOMY N/A 02/08/2022   Procedure: LAPAROSCOPIC CHOLECYSTECTOMY WITH INTRAOPERATIVE CHOLANGIOGRAM;  Surgeon: Debby Hila, MD;  Location: [facility] ORS;  Service: General;  Laterality: N/A;   EYE SURGERY  12/2019   cataracts   FRACTURE SURGERY  2016   Rt.  leg   JOINT REPLACEMENT  09/2015   Right knee   LASIK     MENISCUS REPAIR Right 2011   Orthopedic in [facility]   TOTAL KNEE ARTHROPLASTY Right 09/20/2015   TOTAL KNEE ARTHROPLASTY Right 09/20/2015   Procedure: TOTAL KNEE ARTHROPLASTY;  Surgeon: Lamar Millman, MD;  Location: [facility] OR;  Service: Orthopedics;  Laterality: Right;   Trigger  Thumb Right 2015   Done in [place] at the same time as the carpal tunnel release   TUBAL LIGATION  1980   Family History  Problem Relation Age of Onset   Heart disease Mother    Arthritis Mother    Heart failure Mother    Heart attack Mother    Anxiety disorder Mother    Depression Mother    Hearing loss Mother    Varicose Veins Mother    Early death Father    Heart attack Father    Hearing loss Father    Heart disease Father    Hyperlipidemia Sister    Heart disease Sister    Hearing loss Sister    Arthritis Sister    Irregular heart beat Sister    Hypertension Sister    Hypertension Brother    Hyperlipidemia Brother    Heart attack Brother        Leisure Centre Manager (Brother)   Asthma Brother    Alcohol abuse Brother    Heart disease Brother    Early death Brother    Heart attack Brother    Depression Son    Alcohol abuse Maternal Aunt    Alcohol abuse Maternal Aunt    Social History:  reports that she has never smoked. She has never used smokeless tobacco. She reports that she does not currently use alcohol. She reports that  she does not use drugs. Allergies:  Allergies  Allergen Reactions   Penicillins Hives    Tolerates Cephalosporins    Prednisone  Other (See Comments)    Hyperactivity Sleep disturbance Confusion   Sulfa Antibiotics Hives        Medications Prior to Admission  Medication Sig Dispense Refill   acetaminophen  (TYLENOL ) 500 MG tablet Take 500 mg by mouth every 6 (six) hours as needed for moderate pain (pain score 4-6).     amLODipine -benazepril  (LOTREL ) 5-20 MG capsule Take 1 capsule by mouth daily. 90 capsule 3   atorvastatin  (LIPITOR) 20 MG tablet Take 1 tablet (20 mg total) by mouth daily. 90 tablet 3   Cannabinoids (THC FREE PO) Take 1 each by mouth at bedtime as needed (sleep).     cyanocobalamin  1000 MCG tablet Take 0.5 tablets (500 mcg total) by mouth daily. 90 tablet 0   cyclobenzaprine  (FLEXERIL ) 5 MG tablet Take 1 tablet (5 mg total) by mouth  3 (three) times daily as needed for muscle spasms. 15 tablet 0   ibuprofen (ADVIL) 600 MG tablet Take 1 tablet (600 mg total) by mouth every 8 (eight) hours as needed. 60 tablet 1   lacosamide  (VIMPAT ) 50 MG TABS tablet Take 1 tablet (50 mg total) by mouth 2 (two) times daily. 180 tablet 2   Multiple Vitamins-Minerals (MULTIVITAMIN WOMEN 50+) TABS Take 1 tablet by mouth daily.     omeprazole  (PRILOSEC) 40 MG capsule Take 1 capsule (40 mg total) by mouth daily. 90 capsule 1   OVER THE COUNTER MEDICATION Take 1 capsule by mouth every 12 (twelve) hours as needed (headache, cold/flu-like symptoms). OTC cold capsules     Polyethyl Glycol-Propyl Glycol 0.4-0.3 % SOLN Place 1 drop into both eyes at bedtime.     QUEtiapine  (SEROQUEL ) 25 MG tablet Take 25 mg by mouth at bedtime.     raloxifene  (EVISTA ) 60 MG tablet Take 1 tablet (60 mg total) by mouth daily. 90 tablet 3   triamterene -hydrochlorothiazide  (DYAZIDE ) 37.5-25 MG capsule Take 1 capsule by mouth daily. 90 capsule 3   venlafaxine  XR (EFFEXOR -XR) 150 MG 24 hr capsule Take 1 capsule (150 mg total) by mouth daily with breakfast. 90 capsule 0   zolpidem  (AMBIEN ) 5 MG tablet Take 1-1.5 tablets (5-7.5 mg total) by mouth at bedtime as needed for sleep. 45 tablet 1   Home: Home Living Family/patient expects to be discharged to:: Private residence Living Arrangements: Spouse/significant other Available Help at Discharge: Family, Available 24 hours/day Type of Home: House Home Access: Stairs to enter [employer] of Steps: 2 Entrance Stairs-Rails: Right Home Layout: One level Bathroom Shower/Tub: Health Visitor: Standard Bathroom Accessibility: No Home Equipment: Agricultural Consultant (2 wheels), [employer] - single point, Grab bars - tub/shower, Environmental Education Officer Comments: son and spouse present to assist with providing PLOF (pt questionable historian)   Functional History: Prior Function Prior Level of Function :  Independent/Modified Independent, History of Falls (last six months) (only fall on 10/31 per family) Mobility Comments: No AD PTA ADLs Comments: indep, + driving, medication & financial mgmt prior to previous hospitalization in Sept   Functional Status:  Mobility: Bed Mobility Overal bed mobility: Needs Assistance Bed Mobility: Supine to Sit Supine to sit: Contact guard, Used rails, HOB elevated Sit to supine: Contact guard assist General bed mobility comments: increased time Transfers Overall transfer level: Needs assistance Equipment used: None Transfers: Sit to/from Stand, Bed to chair/wheelchair/BSC Sit to Stand: Contact guard assist Bed to/from chair/wheelchair/BSC  transfer type:: Step pivot Step pivot transfers: Contact guard assist General transfer comment: Pt appears shaky, but no buckling or LOB. Pt able to stand x2 and pivot to/from [facility] with CGA for safety Ambulation/Gait General Gait Details: pt deferred due to fatigue and shaking   ADL: ADL Overall ADL's : Needs assistance/impaired Eating/Feeding: Set up, Sitting Eating/Feeding Details (indicate cue type and reason): up in chair Grooming: Set up, Wash/dry face, Oral care, Applying deodorant, Sitting Grooming Details (indicate cue type and reason): seated EOB Upper Body Bathing: Set up, Sitting Lower Body Bathing: Minimal assistance, Sitting/lateral leans, Sit to/from stand Lower Body Bathing Details (indicate cue type and reason): thoroughness for peri area bathing Upper Body Dressing : Minimal assistance, Sitting Upper Body Dressing Details (indicate cue type and reason): gown exchange, assist for IV mgmt and threading clean gown fully over shoulders Lower Body Dressing: Minimal assistance, Sitting/lateral leans Lower Body Dressing Details (indicate cue type and reason): exchanging dirty socks for clean ones via figure four method with incr effort Toilet Transfer: Contact guard assist, Stand-pivot, BSC/3in1, Rolling  walker (2 wheels) Toilet Transfer Details (indicate cue type and reason): cued for safe approach to [facility] Toileting- Clothing Manipulation and Hygiene: Minimal assistance, Sitting/lateral lean Toileting - Clothing Manipulation Details (indicate cue type and reason): attempted to void, unable Functional mobility during ADLs: Minimal assistance, Rolling walker (2 wheels)   Cognition: Cognition Orientation Level: Oriented X4 Cognition Arousal: Alert Behavior During Therapy: WFL for tasks assessed/performed   Physical Exam: There were no vitals taken for this visit. Physical Exam Gen: no distress, normal appearing HEENT: oral mucosa pink and moist, NCAT Cardio: Reg rate Chest: normal effort, normal rate of breathing Abd: soft, non-distended Ext: no edema Psych: pleasant, normal affect Skin: intact Neuro: Alert and oriented x3, 4/5 strength throughout. Sensation is intact throughout  Results for orders placed or performed during the hospital encounter of 12/16/23 (from the past 48 hours)  CBC     Status: Abnormal   Collection Time: 12/22/23  2:39 AM  Result Value Ref Range   WBC 9.7 4.0 - 10.5 K/uL   RBC 3.89 3.87 - 5.11 MIL/uL   Hemoglobin 10.8 (L) 12.0 - 15.0 g/dL   HCT 67.3 (L) 63.9 - 53.9 %   MCV 83.8 80.0 - 100.0 fL   MCH 27.8 26.0 - 34.0 pg   MCHC 33.1 30.0 - 36.0 g/dL   RDW 83.0 (H) 88.4 - 84.4 %   Platelets 440 (H) 150 - 400 K/uL   nRBC 0.0 0.0 - 0.2 %    Comment: Performed at [facility], [address] [place], [place] [address]  Magnesium      Status: None   Collection Time: 12/22/23  2:39 AM  Result Value Ref Range   Magnesium  1.8 1.7 - 2.4 mg/dL    Comment: Performed at [facility], [address] [place], [place] [address]  Basic metabolic panel with GFR     Status: Abnormal   Collection Time: 12/22/23  2:39 AM  Result Value Ref Range   Sodium 141 135 - 145 mmol/L   Potassium 3.3 (L) 3.5 - 5.1 mmol/L   Chloride 106 98 - 111 mmol/L   CO2 20  (L) 22 - 32 mmol/L   Glucose, Bld 98 70 - 99 mg/dL    Comment: Glucose reference range applies only to samples taken after fasting for at least 8 hours.   BUN 17 8 - 23 mg/dL   Creatinine, Ser 9.06 0.44 -  1.00 mg/dL   Calcium  8.7 (L) 8.9 - 10.3 mg/dL   GFR, Estimated >39 >39 mL/min    Comment: (NOTE) Calculated using the CKD-EPI Creatinine Equation (2021)    Anion gap 15 5 - 15    Comment: Performed at [facility], [address] [place], [place] [address]  Phosphorus     Status: None   Collection Time: 12/22/23  2:39 AM  Result Value Ref Range   Phosphorus 3.1 2.5 - 4.6 mg/dL    Comment: Performed at [facility], [address], [place], [place] [address]  Glucose, capillary     Status: Abnormal   Collection Time: 12/22/23  4:27 AM  Result Value Ref Range   Glucose-Capillary 107 (H) 70 - 99 mg/dL    Comment: Glucose reference range applies only to samples taken after fasting for at least 8 hours.   No results found.    There were no vitals taken for this visit.  Medical Problem List and Plan: 1. Debility 2/2 dizziness 2/2 dehydration and hypovolemia  -patient may shower  -ELOS/Goals: 10-14 days S  Admit to CIR  2.  Antithrombotics: -DVT/anticoagulation:  Heparin  -antiplatelet therapy: aspirin 81 mg daily  3. Pain: continue tylenol  q6H scheduled  4. Insomnia: continue trazodone  100mg  HS  5. Neuropsych/cognition: This patient is capable of making decisions on her own behalf.  6. Seizure like activity: EEG was negative, Neurology has recommended Vimpat  taper and outpatient neurology follow-up to manage taper  7. Generalized anxiety disorder and depression: continue Venlafaxine .  8. Dysarthria: appears to have greatly improved, does not appear to require SLP   9. Impaired cognition: also appears to have greatly improved, will not order SLP at this time  10. Chronic diarrhea: continue outpatient GI follow-up, consider psyllium husk to help bulk  stool  11. HTN: Continue Lotensin  and amlodipine   12. Osteopenia: continue Evista   13. Punctate right hippocampal DWI abnormality: as per neurology not concerning for CVA and no need for repeat MRI, continue statin   I have personally performed a face to face diagnostic evaluation, including, but not limited to relevant history and physical exam findings, of this patient and developed relevant assessment and plan.  Additionally, I have reviewed and concur with the physician assistant's documentation above.   Sven SHAUNNA Elks, MD 12/23/2023

## 2023-12-23 NOTE — Plan of Care (Signed)

## 2023-12-23 NOTE — Discharge Instructions (Signed)
 Patient is being discharged to acute inpatient rehab in hospital.

## 2023-12-23 NOTE — H&P (Signed)
 Physical Medicine and Rehabilitation Admission H&P    Chief Complaint  Patient presents with   Fall   Shortness of Breath   Aphasia  : HPI: [name] is a 78 year old woman with a PMH of HTN, HLD, osteopenia, seizures, and CKD. She was hospitalized in September with AMS. She presented to [facility] on 12/16/23 with falls and slurred speech. She had hit her head and reported nausea and diarrhea. CT head was negative for acute abnormalities. CXR revealed mild cardiomegaly with no acute infiltrates. C-spine, right knee, right rib, and left shoulder Xrs have been negative. UA was WNL. MRI showed possible punctate stroke MRA negative for LVO. LTM EEG on 11/4 is negative for seizures. LTM EEG on 11/5 was also negative for seizures. Patient is being admitted to CIR for impaired mobility and ADLs.  ROS +diffuse generalized weakness Past Medical History:  Diagnosis Date   Allergy 1980's   Sulfa drugs and penecillin   Anxiety    Cataract 2021   Corrected   Chronic kidney disease 2024   Stage 3   Depression    Headache    SINUS   Hyperlipidemia    Hypertension 2021   Osteopenia    Osteopenia    Primary localized osteoarthritis of right knee    Seizures (HCC) 10/2023   Past Surgical History:  Procedure Laterality Date   BREAST EXCISIONAL BIOPSY Right    benign cyst removal    CARPAL TUNNEL RELEASE Right 2015   Ortho in [place]   CHOLECYSTECTOMY N/A 02/08/2022   Procedure: LAPAROSCOPIC CHOLECYSTECTOMY WITH INTRAOPERATIVE CHOLANGIOGRAM;  Surgeon: Debby Hila, MD;  Location: [facility] ORS;  Service: General;  Laterality: N/A;   EYE SURGERY  12/2019   cataracts   FRACTURE SURGERY  2016   Rt.  leg   JOINT REPLACEMENT  09/2015   Right knee   LASIK     MENISCUS REPAIR Right 2011   Orthopedic in [facility]   TOTAL KNEE ARTHROPLASTY Right 09/20/2015   TOTAL KNEE ARTHROPLASTY Right 09/20/2015   Procedure: TOTAL KNEE ARTHROPLASTY;  Surgeon: Lamar Millman, MD;  Location: [facility];  Service:  Orthopedics;  Laterality: Right;   Trigger Thumb Right 2015   Done in [place] at the same time as the carpal tunnel release   TUBAL LIGATION  1980   Family History  Problem Relation Age of Onset   Heart disease Mother    Arthritis Mother    Heart failure Mother    Heart attack Mother    Anxiety disorder Mother    Depression Mother    Hearing loss Mother    Varicose Veins Mother    Early death Father    Heart attack Father    Hearing loss Father    Heart disease Father    Hyperlipidemia Sister    Heart disease Sister    Hearing loss Sister    Arthritis Sister    Irregular heart beat Sister    Hypertension Sister    Hypertension Brother    Hyperlipidemia Brother    Heart attack Brother        Leisure Centre Manager (Brother)   Asthma Brother    Alcohol abuse Brother    Heart disease Brother    Early death Brother    Heart attack Brother    Depression Son    Alcohol abuse Maternal Aunt    Alcohol abuse Maternal Aunt    Social History:  reports that she has never smoked. She has never used smokeless tobacco.  She reports that she does not currently use alcohol. She reports that she does not use drugs. Allergies:  Allergies  Allergen Reactions   Penicillins Hives    Tolerates Cephalosporins    Prednisone  Other (See Comments)    Hyperactivity Sleep disturbance Confusion   Sulfa Antibiotics Hives        Medications Prior to Admission  Medication Sig Dispense Refill   acetaminophen  (TYLENOL ) 500 MG tablet Take 500 mg by mouth every 6 (six) hours as needed for moderate pain (pain score 4-6).     amLODipine -benazepril  (LOTREL ) 5-20 MG capsule Take 1 capsule by mouth daily. 90 capsule 3   atorvastatin  (LIPITOR) 20 MG tablet Take 1 tablet (20 mg total) by mouth daily. 90 tablet 3   Cannabinoids (THC FREE PO) Take 1 each by mouth at bedtime as needed (sleep).     cyanocobalamin  1000 MCG tablet Take 0.5 tablets (500 mcg total) by mouth daily. 90 tablet 0   cyclobenzaprine  (FLEXERIL ) 5 MG  tablet Take 1 tablet (5 mg total) by mouth 3 (three) times daily as needed for muscle spasms. 15 tablet 0   ibuprofen (ADVIL) 600 MG tablet Take 1 tablet (600 mg total) by mouth every 8 (eight) hours as needed. 60 tablet 1   lacosamide  (VIMPAT ) 50 MG TABS tablet Take 1 tablet (50 mg total) by mouth 2 (two) times daily. 180 tablet 2   Multiple Vitamins-Minerals (MULTIVITAMIN WOMEN 50+) TABS Take 1 tablet by mouth daily.     omeprazole  (PRILOSEC) 40 MG capsule Take 1 capsule (40 mg total) by mouth daily. 90 capsule 1   OVER THE COUNTER MEDICATION Take 1 capsule by mouth every 12 (twelve) hours as needed (headache, cold/flu-like symptoms). OTC cold capsules     Polyethyl Glycol-Propyl Glycol 0.4-0.3 % SOLN Place 1 drop into both eyes at bedtime.     QUEtiapine  (SEROQUEL ) 25 MG tablet Take 25 mg by mouth at bedtime.     raloxifene  (EVISTA ) 60 MG tablet Take 1 tablet (60 mg total) by mouth daily. 90 tablet 3   triamterene -hydrochlorothiazide  (DYAZIDE ) 37.5-25 MG capsule Take 1 capsule by mouth daily. 90 capsule 3   venlafaxine  XR (EFFEXOR -XR) 150 MG 24 hr capsule Take 1 capsule (150 mg total) by mouth daily with breakfast. 90 capsule 0   zolpidem  (AMBIEN ) 5 MG tablet Take 1-1.5 tablets (5-7.5 mg total) by mouth at bedtime as needed for sleep. 45 tablet 1      Home: Home Living Family/patient expects to be discharged to:: Private residence Living Arrangements: Spouse/significant other Available Help at Discharge: Family, Available 24 hours/day Type of Home: House Home Access: Stairs to enter [employer] of Steps: 2 Entrance Stairs-Rails: Right Home Layout: One level Bathroom Shower/Tub: Health Visitor: Standard Bathroom Accessibility: No Home Equipment: Agricultural Consultant (2 wheels), [employer] - single point, Grab bars - tub/shower, Environmental Education Officer Comments: son and spouse present to assist with providing PLOF (pt questionable historian)   Functional  History: Prior Function Prior Level of Function : Independent/Modified Independent, History of Falls (last six months) (only fall on 10/31 per family) Mobility Comments: No AD PTA ADLs Comments: indep, + driving, medication & financial mgmt prior to previous hospitalization in Sept  Functional Status:  Mobility: Bed Mobility Overal bed mobility: Needs Assistance Bed Mobility: Supine to Sit Supine to sit: Contact guard, Used rails, HOB elevated Sit to supine: Contact guard assist General bed mobility comments: increased time Transfers Overall transfer level: Needs assistance Equipment used: None Transfers: Sit  to/from Stand, Bed to chair/wheelchair/BSC Sit to Stand: Contact guard assist Bed to/from chair/wheelchair/BSC transfer type:: Step pivot Step pivot transfers: Contact guard assist General transfer comment: Pt appears shaky, but no buckling or LOB. Pt able to stand x2 and pivot to/from [facility] with CGA for safety Ambulation/Gait General Gait Details: pt deferred due to fatigue and shaking    ADL: ADL Overall ADL's : Needs assistance/impaired Eating/Feeding: Set up, Sitting Eating/Feeding Details (indicate cue type and reason): up in chair Grooming: Set up, Wash/dry face, Oral care, Applying deodorant, Sitting Grooming Details (indicate cue type and reason): seated EOB Upper Body Bathing: Set up, Sitting Lower Body Bathing: Minimal assistance, Sitting/lateral leans, Sit to/from stand Lower Body Bathing Details (indicate cue type and reason): thoroughness for peri area bathing Upper Body Dressing : Minimal assistance, Sitting Upper Body Dressing Details (indicate cue type and reason): gown exchange, assist for IV mgmt and threading clean gown fully over shoulders Lower Body Dressing: Minimal assistance, Sitting/lateral leans Lower Body Dressing Details (indicate cue type and reason): exchanging dirty socks for clean ones via figure four method with incr effort Toilet Transfer:  Contact guard assist, Stand-pivot, BSC/3in1, Rolling walker (2 wheels) Toilet Transfer Details (indicate cue type and reason): cued for safe approach to [facility] [address] Toileting- Clothing Manipulation and Hygiene: Minimal assistance, Sitting/lateral lean Toileting - Clothing Manipulation Details (indicate cue type and reason): attempted to void, unable Functional mobility during ADLs: Minimal assistance, Rolling walker (2 wheels)  Cognition: Cognition Orientation Level: Oriented X4 Cognition Arousal: Alert Behavior During Therapy: WFL for tasks assessed/performed  Physical Exam: Blood pressure 100/61, pulse 98, temperature 97.7 F (36.5 C), temperature source Oral, resp. rate 16, height 5' 2 (1.575 m), weight 83.2 kg, SpO2 100%. Physical Exam Gen: no distress, normal appearing HEENT: oral mucosa pink and moist, NCAT Cardio: Reg rate Chest: normal effort, normal rate of breathing Abd: soft, non-distended Ext: no edema Psych: pleasant, normal affect Skin: intact Neuro: Alert and oriented x3, 4/5 strength throughout. Sensation is intact throughout  Results for orders placed or performed during the hospital encounter of 12/16/23 (from the past 48 hours)  CBC     Status: Abnormal   Collection Time: 12/22/23  2:39 AM  Result Value Ref Range   WBC 9.7 4.0 - 10.5 K/uL   RBC 3.89 3.87 - 5.11 MIL/uL   Hemoglobin 10.8 (L) 12.0 - 15.0 g/dL   HCT 67.3 (L) 63.9 - 53.9 %   MCV 83.8 80.0 - 100.0 fL   MCH 27.8 26.0 - 34.0 pg   MCHC 33.1 30.0 - 36.0 g/dL   RDW 83.0 (H) 88.4 - 84.4 %   Platelets 440 (H) 150 - 400 K/uL   nRBC 0.0 0.0 - 0.2 %    Comment: Performed at [facility], [address], [place], [place] [address]  Magnesium      Status: None   Collection Time: 12/22/23  2:39 AM  Result Value Ref Range   Magnesium  1.8 1.7 - 2.4 mg/dL    Comment: Performed at [facility], [address], [place], [place] [address]  Basic metabolic panel with GFR     Status: Abnormal   Collection  Time: 12/22/23  2:39 AM  Result Value Ref Range   Sodium 141 135 - 145 mmol/L   Potassium 3.3 (L) 3.5 - 5.1 mmol/L   Chloride 106 98 - 111 mmol/L   CO2 20 (L) 22 - 32 mmol/L   Glucose, Bld 98 70 - 99 mg/dL  Comment: Glucose reference range applies only to samples taken after fasting for at least 8 hours.   BUN 17 8 - 23 mg/dL   Creatinine, Ser 9.06 0.44 - 1.00 mg/dL   Calcium  8.7 (L) 8.9 - 10.3 mg/dL   GFR, Estimated >39 >39 mL/min    Comment: (NOTE) Calculated using the CKD-EPI Creatinine Equation (2021)    Anion gap 15 5 - 15    Comment: Performed at [facility], [address] [place], [place] [address]  Phosphorus     Status: None   Collection Time: 12/22/23  2:39 AM  Result Value Ref Range   Phosphorus 3.1 2.5 - 4.6 mg/dL    Comment: Performed at [facility], [address], [place], [place] [address]  Glucose, capillary     Status: Abnormal   Collection Time: 12/22/23  4:27 AM  Result Value Ref Range   Glucose-Capillary 107 (H) 70 - 99 mg/dL    Comment: Glucose reference range applies only to samples taken after fasting for at least 8 hours.   No results found.    Blood pressure 100/61, pulse 98, temperature 97.7 F (36.5 C), temperature source Oral, resp. rate 16, height 5' 2 (1.575 m), weight 83.2 kg, SpO2 100%.  Medical Problem List and Plan: 1. Debility 2/2 dizziness 2/2 dehydration and hypovolemia  -patient may shower  -ELOS/Goals: 10-14 days S  Admit to CIR  2.  Antithrombotics: -DVT/anticoagulation:  Heparin  -antiplatelet therapy: aspirin 81 mg daily  3. Pain: continue tylenol  q6H scheduled  4. Insomnia: continue trazodone  100mg  HS  5. Neuropsych/cognition: This patient is capable of making decisions on her own behalf.  6. Seizure like activity: EEG was negative, Neurology has recommended Vimpat  taper and outpatient neurology follow-up to manage taper  7. Generalized anxiety disorder and depression: continue Venlafaxine .  8.  Dysarthria: appears to have greatly improved, does not appear to require SLP   9. Impaired cognition: also appears to have greatly improved, will not order SLP at this time  10. Chronic diarrhea: continue outpatient GI follow-up, consider psyllium husk to help bulk stool  11. HTN: Continue Lotensin  and amlodipine   12. Osteopenia: continue Evista   13. Punctate right hippocampal DWI abnormality: as per neurology not concerning for CVA and no need for repeat MRI, continue statin   I have personally performed a face to face diagnostic evaluation, including, but not limited to relevant history and physical exam findings, of this patient and developed relevant assessment and plan.  Additionally, I have reviewed and concur with the physician assistant's documentation above.   Emma SHAUNNA Elks, MD 12/23/2023

## 2023-12-23 NOTE — Progress Notes (Signed)
 Patient transported to 4MW11. Belongings and husband at bedside. No bed in room, patient placed in recliner with call bell in reach. No chair alarm placed under patient and NT made aware.

## 2023-12-23 NOTE — Plan of Care (Signed)
 Patient admitted to rehab. Safety plan reviewed with patient and family. Call bell left within reach.

## 2023-12-23 NOTE — Progress Notes (Signed)
Inpatient Rehab Admissions Coordinator:  ° °I have a CIR bed for this Pt. Today. RN may call report to [phone]. ° °Charish Schroepfer, MS, CCC-SLP °Rehab Admissions Coordinator  °[phone] (celll) °[phone] (office) ° °

## 2023-12-23 NOTE — Progress Notes (Signed)
 Inpatient Rehabilitation Admission Medication Review by a Pharmacist  A complete drug regimen review was completed for this patient to identify any potential clinically significant medication issues.  High Risk Drug Classes Is patient taking? Indication by Medication  Antipsychotic No   Anticoagulant Yes Heparin SQ - DVT prophylaxis  Antibiotic No   Opioid No   Antiplatelet Yes bASA- history of CVA  Hypoglycemics/insulin  No   Vasoactive Medication Yes Amlodipine , benazepril  - HTN  Chemotherapy No   Other Yes Venlafazine - mood Atorvastatin  - HLD Zofran  - N/V Protonix  - GERD APAP,robaxin - pain Vimpat  - seizures     Type of Medication Issue Identified Description of Issue Recommendation(s)  Drug Interaction(s) (clinically significant)     Duplicate Therapy     Allergy     No Medication Administration End Date     Incorrect Dose     Additional Drug Therapy Needed     Significant med changes from prior encounter (inform family/care partners about these prior to discharge).    Other       Clinically significant medication issues were identified that warrant physician communication and completion of prescribed/recommended actions by midnight of the next day:  No  Name of provider notified for urgent issues identified:   Provider Method of Notification:     Pharmacist comments:   Time spent performing this drug regimen review (minutes):  15   Larraine Brazier, PharmD Clinical Pharmacist 12/23/2023  5:03 PM **Pharmacist phone directory can now be found on [email or web address] ([email or web address]).  Listed under [facility].

## 2023-12-24 ENCOUNTER — Other Ambulatory Visit (HOSPITAL_COMMUNITY): Payer: Self-pay

## 2023-12-24 DIAGNOSIS — G9341 Metabolic encephalopathy: Secondary | ICD-10-CM | POA: Diagnosis not present

## 2023-12-24 DIAGNOSIS — R5381 Other malaise: Principal | ICD-10-CM | POA: Diagnosis present

## 2023-12-24 LAB — GLUCOSE, CAPILLARY: Glucose-Capillary: 104 mg/dL — ABNORMAL HIGH (ref 70–99)

## 2023-12-24 MED ORDER — ENOXAPARIN SODIUM 40 MG/0.4ML IJ SOSY
40.0000 mg | PREFILLED_SYRINGE | INTRAMUSCULAR | Status: DC
  Administered 2023-12-25 – 2023-12-27 (×3): 40 mg via SUBCUTANEOUS
  Filled 2023-12-24 (×3): qty 0.4

## 2023-12-24 NOTE — Progress Notes (Signed)
 Inpatient Rehabilitation  Patient information reviewed and entered into eRehab system by [facility]. Karen Kays., CCC/SLP, PPS Coordinator.  Information including medical coding, functional ability and quality indicators will be reviewed and updated through discharge.

## 2023-12-24 NOTE — Plan of Care (Signed)
  Problem: RH SKIN INTEGRITY Goal: RH STG SKIN FREE OF INFECTION/BREAKDOWN Outcome: Progressing   Problem: RH SAFETY Goal: RH STG ADHERE TO SAFETY PRECAUTIONS W/ASSISTANCE/DEVICE Description: STG Adhere to Safety Precautions With Assistance/Device. Outcome: Progressing   Problem: RH PAIN MANAGEMENT Goal: RH STG PAIN MANAGED AT OR BELOW PT'S PAIN GOAL Outcome: Progressing   

## 2023-12-24 NOTE — Evaluation (Signed)
 Physical Therapy Assessment and Plan  Patient Details  Name: [name] MRN: [id number] Date of Birth: 03-17-1945  PT Diagnosis: Difficulty walking, Dizziness and giddiness, Muscle weakness, and Pain in joint Rehab Potential: Excellent ELOS: 5-7 days   Today's Date: 12/24/2023 PT Individual Time: 1300-1400 PT Individual Time Calculation (min): 60 min    Hospital Problem: Active Problems:   * No active hospital problems. *   Past Medical History:  Past Medical History:  Diagnosis Date   Allergy 1980's   Sulfa drugs and penecillin   Anxiety    Cataract 2021   Corrected   Chronic kidney disease 2024   Stage 3   Depression    Headache    SINUS   Hyperlipidemia    Hypertension 2021   Osteopenia    Osteopenia    Primary localized osteoarthritis of right knee    Seizures (HCC) 10/2023   Past Surgical History:  Past Surgical History:  Procedure Laterality Date   BREAST EXCISIONAL BIOPSY Right    benign cyst removal    CARPAL TUNNEL RELEASE Right 2015   Ortho in [place]   CHOLECYSTECTOMY N/A 02/08/2022   Procedure: LAPAROSCOPIC CHOLECYSTECTOMY WITH INTRAOPERATIVE CHOLANGIOGRAM;  Surgeon: Debby Hila, MD;  Location: [facility];  Service: General;  Laterality: N/A;   EYE SURGERY  12/2019   cataracts   FRACTURE SURGERY  2016   Rt.  leg   JOINT REPLACEMENT  09/2015   Right knee   LASIK     MENISCUS REPAIR Right 2011   [facility]thopedic in [facility]   TOTAL KNEE ARTHROPLASTY Right 09/20/2015   TOTAL KNEE ARTHROPLASTY Right 09/20/2015   Procedure: TOTAL KNEE ARTHROPLASTY;  Surgeon: Lamar Millman, MD;  Location: [facility] OR;  Service: Orthopedics;  Laterality: Right;   Trigger Thumb Right 2015   Done in [facility] at the same time as the carpal tunnel release   TUBAL LIGATION  1980    Assessment & Plan Clinical Impression: Mrs. [name] is a 78 year old woman with a PMH of HTN, HLD, osteopenia, seizures, and CKD. She was hospitalized in September with AMS. She presented to [facility] on  12/16/23 with falls and slurred speech. She had hit her head and reported nausea and diarrhea. CT head was negative for acute abnormalities. CXR revealed mild cardiomegaly with no acute infiltrates. C-spine, right knee, right rib, and left shoulder Xrs have been negative. UA was WNL. MRI showed possible punctate stroke MRA negative for LVO. LTM EEG on 11/4 is negative for seizures. LTM EEG on 11/5 was also negative for seizures. Patient is being admitted to CIR for impaired mobility and ADLs.   Patient transferred to CIR on 12/23/2023 .   Patient currently requires min with mobility secondary to muscle weakness and decreased postural control and decreased balance strategies.  Prior to hospitalization, patient was independent  with mobility and lived with Spouse in a House home.  Home access is 2Stairs to enter.  Patient will benefit from skilled PT intervention to maximize safe functional mobility, minimize fall risk, and decrease caregiver burden for planned discharge home with 24 hour supervision.  Anticipate patient will benefit from follow up OP at discharge.  PT - End of Session Endurance Deficit: Yes Endurance Deficit Description: generalized deconditioning PT Assessment Rehab Potential (ACUTE/IP ONLY): Excellent PT Barriers to Discharge: Decreased caregiver support;Home environment access/layout PT Patient demonstrates impairments in the following area(s): Balance;Skin Integrity;Endurance;Safety;Motor;Sensory PT Transfers Functional Problem(s): Bed Mobility;Bed to Chair;Car PT Locomotion Functional Problem(s): Ambulation;Wheelchair Mobility;Stairs PT Plan PT Intensity:  Minimum of 1-2 x/day ,45 to 90 minutes PT Frequency: 5 out of 7 days PT Duration Estimated Length of Stay: 5-7 days PT Treatment/Interventions: Ambulation/gait training;Cognitive remediation/compensation;Discharge planning;DME/adaptive equipment instruction;Functional mobility training;Pain management;Psychosocial  support;Therapeutic Activities;Splinting/orthotics;UE/LE Strength taining/ROM;Visual/perceptual remediation/compensation;Wheelchair propulsion/positioning;UE/LE Coordination activities;Therapeutic Exercise;Stair training;Skin care/wound management;Patient/family education;Neuromuscular re-education;Functional electrical stimulation;Disease management/prevention;Community reintegration;Balance/vestibular training PT Transfers Anticipated Outcome(s): mod I with LRAD PT Locomotion Anticipated Outcome(s): supervision long distance gait PT Recommendation Recommendations for Other Services: None Follow Up Recommendations: Outpatient PT Patient destination: Home Equipment Recommended: To be determined   PT Evaluation Precautions/Restrictions Precautions Precautions: Fall Restrictions Weight Bearing Restrictions Per Provider Order: No General   Vital SignsTherapy Vitals Temp: 98.1 F (36.7 C) Temp Source: Oral Pulse Rate: 85 Resp: 17 BP: (!) 148/67 Patient Position (if appropriate): Sitting Oxygen Therapy SpO2: 97 % O2 Device: Room Air Pain Pain Assessment Pain Scale: 0-10 Pain Score: 4  Pain Location: Back Pain Intervention(s): Medication (See eMAR) Pain Interference Pain Interference Pain Effect on Sleep: 1. Rarely or not at all Pain Interference with Therapy Activities: 1. Rarely or not at all Pain Interference with Day-to-Day Activities: 3. Frequently Home Living/Prior Functioning Home Living Living Arrangements: Spouse/significant other Available Help at Discharge: Family;Available 24 hours/day Type of Home: House Home Access: Stairs to enter [employer] of Steps: 2 Entrance Stairs-Rails: Right Home Layout: One level  Lives With: Spouse Prior Function Level of Independence: Independent with gait;Independent with transfers Vision/Perception  Vision - History Ability to See in Adequate Light: 0 Adequate Perception Perception: Within Functional  Limits Praxis Praxis: WFL  Cognition Overall Cognitive Status: Within Functional Limits for tasks assessed Arousal/Alertness: Awake/alert Orientation Level: Oriented X4 Year: 2025 Month: November Day of Week: Correct Focused Attention: Impaired (pt reports difficulty with memory) Sensation Sensation Light Touch: Appears Intact Proprioception: Appears Intact Stereognosis: Not tested Coordination Gross Motor Movements are Fluid and Coordinated: No Fine Motor Movements are Fluid and Coordinated: No Coordination and Movement Description: Tremor in UE, L>R. Generalized weakness 9 Hole Peg Test: R: 36.02, L: 44.01 Motor  Motor Motor: Other (comment) Motor - Skilled Clinical Observations: generalized weakness   Trunk/Postural Assessment  Cervical Assessment Cervical Assessment: Within Functional Limits Thoracic Assessment Thoracic Assessment: Within Functional Limits Lumbar Assessment Lumbar Assessment: Within Functional Limits Postural Control Postural Control: Within Functional Limits  Balance Balance Balance Assessed: Yes Static Sitting Balance Static Sitting - Balance Support: No upper extremity supported Static Sitting - Level of Assistance: 7: Independent Dynamic Sitting Balance Dynamic Sitting - Balance Support: No upper extremity supported Dynamic Sitting - Level of Assistance: 6: Modified independent (Device/Increase time) Static Standing Balance Static Standing - Balance Support: Bilateral upper extremity supported Static Standing - Level of Assistance: 5: Stand by assistance Dynamic Standing Balance Dynamic Standing - Balance Support: Bilateral upper extremity supported Dynamic Standing - Level of Assistance: 4: Min assist Extremity Assessment      RLE Assessment RLE Assessment: Exceptions to [facility] General Strength Comments: 4/5 grossly LLE Assessment LLE Assessment: Exceptions to [facility] General Strength Comments: 4-/5 grossly  Care Tool Care Tool Bed  Mobility Roll left and right activity   Roll left and right assist level: Supervision/Verbal cueing    Sit to lying activity   Sit to lying assist level: Supervision/Verbal cueing    Lying to sitting on side of bed activity   Lying to sitting on side of bed assist level: the ability to move from lying on the back to sitting on the side of the bed with no back support.: Supervision/Verbal cueing  Care Tool Transfers Sit to stand transfer   Sit to stand assist level: Contact Guard/Touching assist    Chair/bed transfer   Chair/bed transfer assist level: Contact Guard/Touching assist    Car transfer   Car transfer assist level: Contact Guard/Touching assist      Care Tool Locomotion Ambulation   Assist level: Contact Guard/Touching assist Assistive device: Walker-rolling Max distance: 100  Walk 10 feet activity   Assist level: Contact Guard/Touching assist Assistive device: Walker-rolling   Walk 50 feet with 2 turns activity   Assist level: Contact Guard/Touching assist Assistive device: Walker-rolling  Walk 150 feet activity Walk 150 feet activity did not occur: Safety/medical concerns (fatigue, SOB)      Walk 10 feet on uneven surfaces activity   Assist level: Contact Guard/Touching assist Assistive device: Walker-rolling  Stairs   Assist level: Contact Guard/Touching assist Stairs assistive device: 2 hand rails Max number of stairs: 12  Walk up/down 1 step activity   Walk up/down 1 step (curb) assist level: Contact Guard/Touching assist Walk up/down 1 step or curb assistive device: 2 hand rails  Walk up/down 4 steps activity   Walk up/down 4 steps assist level: Contact Guard/Touching assist Walk up/down 4 steps assistive device: 2 hand rails  Walk up/down 12 steps activity   Walk up/down 12 steps assist level: Contact Guard/Touching assist Walk up/down 12 steps assistive device: 2 hand rails  Pick up small objects from floor Pick up small object from the floor  (from standing position) activity did not occur: Safety/medical concerns      Wheelchair Is the patient using a wheelchair?: Yes Type of Wheelchair: Manual   Wheelchair assist level: Supervision/Verbal cueing Max wheelchair distance: 150  Wheel 50 feet with 2 turns activity   Assist Level: Supervision/Verbal cueing  Wheel 150 feet activity   Assist Level: Supervision/Verbal cueing    Refer to Care Plan for Long Term Goals  SHORT TERM GOAL WEEK 1 PT Short Term Goal 1 (Week 1): =LTGs d/t ELOS  Recommendations for other services: None   Skilled Therapeutic Intervention Evaluation completed (see details above) with patient education regarding purpose of PT evaluation, PT POC and goals, therapy schedule, weekly team meetings, and other CIR information including safety plan and fall risk safetythe patient reports 4/10 pain, recd pain medication during session.  Pt performed the below functional mobility tasks with the specified levels of skilled cuing and assistance. Pt was most limited by fatigue and decreased endurance this session. Pt remained in recliner at end of session, was left with all needs in reach and alarm active.   Mobility Bed Mobility Bed Mobility: Sit to Supine;Supine to Sit Supine to Sit: Supervision/Verbal cueing Sit to Supine: Supervision/Verbal cueing Transfers Transfers: Sit to Stand;Stand Pivot Transfers;Stand to Sit Sit to Stand: Contact Guard/Touching assist Stand to Sit: Contact Guard/Touching assist Stand Pivot Transfers: Contact Guard/Touching assist Transfer (Assistive device): Rolling walker Locomotion  Gait Ambulation: Yes Gait Assistance: Contact Guard/Touching assist Gait Distance (Feet): 100 Feet Assistive device: Rolling walker Gait Gait: Yes Gait Pattern: Within Functional Limits Gait velocity: decr Stairs / Additional Locomotion Stairs: Yes Stairs Assistance: Contact Guard/Touching assist Stair Management Technique: Two rails Number of  Stairs: 12 Height of Stairs: 6 Ramp: Contact Guard/touching assist Wheelchair Mobility Wheelchair Mobility: Yes Wheelchair Assistance: Doctor, General Practice: Both upper extremities Wheelchair Parts Management: Supervision/cueing   Discharge Criteria: Patient will be discharged from PT if patient refuses treatment 3 consecutive times without medical reason, if treatment goals not met, if  there is a change in medical status, if patient makes no progress towards goals or if patient is discharged from hospital.  The above assessment, treatment plan, treatment alternatives and goals were discussed and mutually agreed upon: by patient  Schuyler JAYSON Batter 12/24/2023, 4:07 PM

## 2023-12-24 NOTE — Progress Notes (Signed)
 Inpatient Rehabilitation Care Coordinator Assessment and Plan Patient Details  Name: [name] MRN: [id number] Date of Birth: March 19, 1945  Today's Date: 12/24/2023  Hospital Problems: Active Problems:   * No active hospital problems. *  Past Medical History:  Past Medical History:  Diagnosis Date   Allergy 1980's   Sulfa drugs and penecillin   Anxiety    Cataract 2021   Corrected   Chronic kidney disease 2024   Stage 3   Depression    Headache    SINUS   Hyperlipidemia    Hypertension 2021   Osteopenia    Osteopenia    Primary localized osteoarthritis of right knee    Seizures (HCC) 10/2023   Past Surgical History:  Past Surgical History:  Procedure Laterality Date   BREAST EXCISIONAL BIOPSY Right    benign cyst removal    CARPAL TUNNEL RELEASE Right 2015   Ortho in [place]   CHOLECYSTECTOMY N/A 02/08/2022   Procedure: LAPAROSCOPIC CHOLECYSTECTOMY WITH INTRAOPERATIVE CHOLANGIOGRAM;  Surgeon: Debby Hila, MD;  Location: [facility];  Service: General;  Laterality: N/A;   EYE SURGERY  12/2019   cataracts   FRACTURE SURGERY  2016   Rt.  leg   JOINT REPLACEMENT  09/2015   Right knee   LASIK     MENISCUS REPAIR Right 2011   Orthopedic in [facility]   TOTAL KNEE ARTHROPLASTY Right 09/20/2015   TOTAL KNEE ARTHROPLASTY Right 09/20/2015   Procedure: TOTAL KNEE ARTHROPLASTY;  Surgeon: Lamar Millman, MD;  Location: [facility] OR;  Service: Orthopedics;  Laterality: Right;   Trigger Thumb Right 2015   Done in [place] at the same time as the carpal tunnel release   TUBAL LIGATION  1980   Social History:  reports that she has never smoked. She has never used smokeless tobacco. She reports that she does not currently use alcohol. She reports that she does not use drugs.  Family / Support Systems Marital Status: Married How Long?: 57 years Patient Roles: Spouse, Parent Spouse/Significant Other: Jerilynn (husband) Children: 1 son- jeremy lives in [place] and can provide PRN  support Other Supports: none Anticipated Caregiver: Husbnad Ability/Limitations of Caregiver: Pt husband will be primary caregiver Caregiver Availability: 24/7 Family Dynamics: Pt lives with her husband  Social History Preferred language: English Religion: Christian Cultural Background: Patient worked as a comptroller (pubic/university) Education: Chief Strategy Officer - How often do you need to have someone help you when you read instructions, pamphlets, or other written material from your doctor or pharmacy?: Rarely Writes: Yes Employment Status: Retired Marine Scientist Issues: Denies Electronics Engineer: Denies   Abuse/Neglect Abuse/Neglect Assessment Can Be Completed: Yes Physical Abuse: Denies Verbal Abuse: Denies Sexual Abuse: Denies Exploitation of patient/patient's resources: Denies Self-Neglect: Denies  Patient response to: Social Isolation - How often do you feel lonely or isolated from those around you?: Rarely  Emotional Status Pt's affect, behavior and adjustment status: Pt in good spirits at time visit Recent Psychosocial Issues: Denies Psychiatric History: Pt reports she has always had insomnia, and always prone to stress Substance Abuse History: Denies  Patient / Family Perceptions, Expectations & Goals Pt/Family understanding of illness & functional limitations: Pt and husband has a general understanding of care needs Premorbid pt/family roles/activities: Independent Anticipated changes in roles/activities/participation: Assistance with ADLs/IADLs Pt/family expectations/goals: Pt goal is to work n legs and strength; shares she is ready to go home  [employer]: None Premorbid Home Care/DME Agencies: None Transportation available at discharge: Husband Is the patient  able to respond to transportation needs?: Yes In the past 12 months, has lack of transportation kept you from medical appointments or from getting  medications?: No In the past 12 months, has lack of transportation kept you from meetings, work, or from getting things needed for daily living?: No Resource referrals recommended: Neuropsychology  Discharge Planning Living Arrangements: Spouse/significant other Support Systems: Spouse/significant other Type of Residence: Private residence Insurance Resources: Electrical Engineer Resources: Restaurant Manager, Fast Food Screen Referred: No Living Expenses: Banker Management: Patient, Spouse Does the patient have any problems obtaining your medications?: No Home Management: Reports she and her husband prepare meals; they have hired a pensions consultant to assist Patient/Family Preliminary Plans: no changes Care Coordinator Barriers to Discharge: Decreased caregiver support, Lack of/limited family support Care Coordinator Anticipated Follow Up Needs: HH/OP  Clinical Impression SW met with pt at bedside to introduce self, explain role, and discuss discharge process. She is not a cytogeneticist. No HCPOA . DME- cane, 3in1 BSC, RW, and shower stool. Permission to call her husband.   Rye Dorado A Ruchy Wildrick 12/24/2023, 2:41 PM

## 2023-12-24 NOTE — Progress Notes (Signed)
 Met with patient to current situation,  team conference and review plan of care. Reviewed medications, safety, bowel, bladder and sleep. Per patient she has difficulty with sleep.Encouraged patient to ask for med to aid for sleeping in order for her to be able to participate with therapy the next day. Nurse notified. Continue to follow along to provide educational needs to facilitate preparation for discharge.

## 2023-12-24 NOTE — Evaluation (Signed)
 Speech Language Pathology Assessment and Plan  Patient Details  Name: [name] MRN: [id number] Date of Birth: July 20, 1945  SLP Diagnosis:    Rehab Potential:   ELOS:     Today's Date: 12/24/2023 SLP Individual Time: 9198-9143 SLP Individual Time Calculation (min): 55 min  Hospital Problem: Active Problems:   * No active hospital problems. *  Past Medical History:  Past Medical History:  Diagnosis Date   Allergy 1980's   Sulfa drugs and penecillin   Anxiety    Cataract 2021   Corrected   Chronic kidney disease 2024   Stage 3   Depression    Headache    SINUS   Hyperlipidemia    Hypertension 2021   Osteopenia    Osteopenia    Primary localized osteoarthritis of right knee    Seizures (HCC) 10/2023   Past Surgical History:  Past Surgical History:  Procedure Laterality Date   BREAST EXCISIONAL BIOPSY Right    benign cyst removal    CARPAL TUNNEL RELEASE Right 2015   Ortho in [place]   CHOLECYSTECTOMY N/A 02/08/2022   Procedure: LAPAROSCOPIC CHOLECYSTECTOMY WITH INTRAOPERATIVE CHOLANGIOGRAM;  Surgeon: Debby Hila, MD;  Location: [facility];  Service: General;  Laterality: N/A;   EYE SURGERY  12/2019   cataracts   FRACTURE SURGERY  2016   Rt.  leg   JOINT REPLACEMENT  09/2015   Right knee   LASIK     MENISCUS REPAIR Right 2011   Orthopedic in [facility]   TOTAL KNEE ARTHROPLASTY Right 09/20/2015   TOTAL KNEE ARTHROPLASTY Right 09/20/2015   Procedure: TOTAL KNEE ARTHROPLASTY;  Surgeon: Lamar Millman, MD;  Location: [facility] [place] OR;  Service: Orthopedics;  Laterality: Right;   Trigger Thumb Right 2015   Done in [place] at the same time as the carpal tunnel release   TUBAL LIGATION  1980    Assessment / Plan / Recommendation Clinical Impression [name] is a 78 year old woman with a PMH of HTN, HLD, osteopenia, seizures, and CKD. She was hospitalized in September with AMS. She presented to [facility] on 12/16/23 with falls and slurred speech. She had hit her head and  reported nausea and diarrhea. CT head was negative for acute abnormalities. CXR revealed mild cardiomegaly with no acute infiltrates. C-spine, right knee, right rib, and left shoulder Xrs have been negative. UA was WNL. MRI showed possible punctate stroke MRA negative for LVO. LTM EEG on 11/4 is negative for seizures. LTM EEG on 11/5 was also negative for seizures. Patient is being admitted to CIR for impaired mobility and ADLs.   Cognitive/ Linguistic. Pt presents with a mild expressive language deficit. Portions of the Western Aphasia Battery- Bedside Form completed revealing strengths in naming, auditory comprehension, and repetition. Her fluency was described as; some hesitations and word finding difficulty. Pt confirms changes to language upon hospitalization however, reports symptoms have largely resolved and she is back to baseline. Informally, some instances of phonemic paraphasias noted with pt able to self correct consistently. She also demonstrated some mild cluttering though it did not impact communication. Patient's overall cognitive functioning was not formally assessed but appeared [facility] during functional tasks. Patient also endorses that she feels she is at her cognitive baseline. She was oriented x4 and verbalized recent medical hx in detail. No follow up intervention warranted at this time.   Swallowing: Oral mechansism exam completed and was unremarkable besides some missing dentition. She consumed sips of thin liquid via straw without overt s/s aspiration and administered  trials of regular solids. Pt with timely mastication of textures and consistent oral clearance. X1 cough observed during trials though suspect due to pt attempting to talk during trial. Pt appropriate for diet upgrade to regular solids at this time given standard swallow precautions. No further skilled intervention warranted at this time.   No further skilled speech language services warranted at this time.     Skilled  Therapeutic Interventions          BSE, informal assessment measures, and portions of WAB- bedside form administered. Please see full report for additional details.      SLP Assessment  Patient does not need any further Speech Lanaguage Pathology Services    Recommendations  SLP Diet Recommendations: Age appropriate regular solids;Thin Medication Administration: Whole meds with liquid Supervision: Patient able to self feed Compensations: Small sips/bites Postural Changes and/or Swallow Maneuvers: Seated upright 90 degrees Oral Care Recommendations: Oral care BID Patient destination: Home Follow up Recommendations: None Equipment Recommended: None recommended by SLP    SLP Frequency     SLP Duration  SLP Intensity  SLP Treatment/Interventions            Pain Pain Assessment Pain Scale: 0-10 Pain Score: 0-No pain Pain Location: Shoulder Pain Intervention(s): Medication (See eMAR);Repositioned  Prior Functioning Cognitive/Linguistic Baseline: Within functional limits Type of Home: House  Lives With: Spouse Available Help at Discharge: Family;Available 24 hours/day Vocation: Retired  Architectural Technologist Overall Cognitive Status: Within Functional Limits for tasks assessed Arousal/Alertness: Awake/alert Orientation Level: Oriented X4 Year: 2025 Month: November Day of Week: Correct Attention: Focused Focused Attention: Appears intact Memory: Appears intact Awareness: Appears intact Problem Solving: Appears intact Safety/Judgment: Appears intact  Comprehension Auditory Comprehension Overall Auditory Comprehension: Impaired Commands: Impaired Complex Commands: 75-100% accurate Conversation: Simple Expression Expression Primary Mode of Expression: Verbal Verbal Expression Overall Verbal Expression: Other (comment) (some phonemic paraphasia which pt is able to correct indep) Initiation: No impairment Level of Generative/Spontaneous Verbalization:  Conversation Repetition: No impairment Naming: No impairment Pragmatics: No impairment Written Expression Dominant Hand: Right Oral Motor Oral Motor/Sensory Function Overall Oral Motor/Sensory Function: Within functional limits Motor Speech Overall Motor Speech: Appears within functional limits for tasks assessed Respiration: Within functional limits Articulation: Within functional limitis Intelligibility: Intelligible  Care Tool Care Tool Cognition Ability to hear (with hearing aid or hearing appliances if normally used Ability to hear (with hearing aid or hearing appliances if normally used): 0. Adequate - no difficulty in normal conservation, social interaction, listening to TV   Expression of Ideas and Wants Expression of Ideas and Wants: 4. Without difficulty (complex and basic) - expresses complex messages without difficulty and with speech that is clear and easy to understand   Understanding Verbal and Non-Verbal Content Understanding Verbal and Non-Verbal Content: 4. Understands (complex and basic) - clear comprehension without cues or repetitions  Memory/Recall Ability Memory/Recall Ability : Current season;That he or she is in a hospital/hospital unit   Bedside Swallowing Assessment General Temperature Spikes Noted: No Respiratory Status: Room air History of Recent Intubation: No Behavior/Cognition: Alert;Cooperative Oral Cavity - Dentition: Adequate natural dentition Self-Feeding Abilities: Able to feed self Patient Positioning: Upright in bed Baseline Vocal Quality: Normal Volitional Cough: Strong Volitional Swallow: Able to elicit  Oral Care Assessment Oral Assessment  (WDL): Exceptions to WDL Lips: Symmetrical Teeth:  (some missing teeth) Tongue: Pink;Moist Mucous Membrane(s): Moist;Pink Saliva: Moist, saliva free flowing Level of Consciousness: Alert Is patient on any of following O2 devices?: None of the above Nutritional status:  No high risk factors Oral  Assessment Risk : Low Risk Ice Chips Ice chips: Not tested Thin Liquid Thin Liquid: Within functional limits Nectar Thick Nectar Thick Liquid: Not tested Honey Thick Honey Thick Liquid: Not tested Puree Puree: Not tested Solid Solid: Within functional limits BSE Assessment Risk for Aspiration Impact on safety and function: Mild aspiration risk Other Related Risk Factors: Deconditioning  Short Term Goals: Week 1:    Refer to Care Plan for Long Term Goals  Recommendations for other services: None   Discharge Criteria: Patient will be discharged from SLP if patient refuses treatment 3 consecutive times without medical reason, if treatment goals not met, if there is a change in medical status, if patient makes no progress towards goals or if patient is discharged from hospital.  The above assessment, treatment plan, treatment alternatives and goals were discussed and mutually agreed upon: by patient  Joane GORMAN Fuss 12/24/2023, 8:54 AM

## 2023-12-24 NOTE — Progress Notes (Signed)
 PROGRESS NOTE   Subjective/Complaints:  Pt reports has had Diarrhea 1-3x/day for months- was planning to see GI outpt, but hadn't yet.   LBM yesterday AM-  Per pt- per Chart was 11/8- small type 6.   Barely slept due to insomnia- had Trazodone  last night 100 mg- but says barely slept-  Mind going   ROS:  Pt denies SOB, abd pain, CP, N/V/C/D, and vision changes    Objective:   No results found. Recent Labs    12/22/23 0239  WBC 9.7  HGB 10.8*  HCT 32.6*  PLT 440*   Recent Labs    12/22/23 0239  NA 141  K 3.3*  CL 106  CO2 20*  GLUCOSE 98  BUN 17  CREATININE 0.93  CALCIUM  8.7*   No intake or output data in the 24 hours ending 12/24/23 1808      Physical Exam: Vital Signs Blood pressure (!) 148/67, pulse 85, temperature 98.1 F (36.7 C), temperature source Oral, resp. rate 17, height 5' 2 (1.575 m), weight 83.1 kg, SpO2 97%.    General: awake, alert, appropriate,  supine in bed; perseverating on goo in hair, appears comfortable; NAD HENT: conjugate gaze; oropharynx dry CV: regular rate and rhythm; no JVD Pulmonary: CTA B/L; no W/R/R- good air movement GI: soft, NT, ND, (+)BS- normoactive Psychiatric: appropriate- slightly anxious and focused on EEG goo.  Neurological: Ox3- but tangential    Assessment/Plan: 1. Functional deficits which require 3+ hours per day of interdisciplinary therapy in a comprehensive inpatient rehab setting. Physiatrist is providing close team supervision and 24 hour management of active medical problems listed below. Physiatrist and rehab team continue to assess barriers to discharge/monitor patient progress toward functional and medical goals  Care Tool:  Bathing    Body parts bathed by patient: Right arm, Left arm, Chest, Abdomen, Buttocks, Front perineal area, Right upper leg, Left upper leg, Right lower leg, Left lower leg, Face         Bathing  assist Assist Level: Contact Guard/Touching assist     Upper Body Dressing/Undressing Upper body dressing   What is the patient wearing?: Pull over shirt    Upper body assist Assist Level: Supervision/Verbal cueing    Lower Body Dressing/Undressing Lower body dressing      What is the patient wearing?: Pants, Underwear/pull up     Lower body assist Assist for lower body dressing: Contact Guard/Touching assist     Toileting Toileting    Toileting assist Assist for toileting: Contact Guard/Touching assist     Transfers Chair/bed transfer  Transfers assist     Chair/bed transfer assist level: Contact Guard/Touching assist     Locomotion Ambulation   Ambulation assist      Assist level: Contact Guard/Touching assist Assistive device: Walker-rolling Max distance: 100   Walk 10 feet activity   Assist     Assist level: Contact Guard/Touching assist Assistive device: Walker-rolling   Walk 50 feet activity   Assist    Assist level: Contact Guard/Touching assist Assistive device: Walker-rolling    Walk 150 feet activity   Assist Walk 150 feet activity did not occur: Safety/medical concerns (fatigue, SOB)  Walk 10 feet on uneven surface  activity   Assist     Assist level: Contact Guard/Touching assist Assistive device: Walker-rolling   Wheelchair     Assist Is the patient using a wheelchair?: Yes Type of Wheelchair: Manual    Wheelchair assist level: Supervision/Verbal cueing Max wheelchair distance: 150    Wheelchair 50 feet with 2 turns activity    Assist        Assist Level: Supervision/Verbal cueing   Wheelchair 150 feet activity     Assist      Assist Level: Supervision/Verbal cueing   Blood pressure (!) 148/67, pulse 85, temperature 98.1 F (36.7 C), temperature source Oral, resp. rate 17, height 5' 2 (1.575 m), weight 83.1 kg, SpO2 97%.  Medical Problem List and Plan: 1. Debility due to  encephalopathy/questionable seizures/dehydration             -patient may shower             -ELOS/Goals: 10-14 days S             Admit to CIR   First day of evaluations- con't CIR PT, OT and SLP 2.  Antithrombotics: -DVT/anticoagulation:  Heparin             -antiplatelet therapy: aspirin 81 mg daily   11/10- change Heparin to Lovenox - renal levels back to baseline 3. Pain: continue tylenol  q6H scheduled   4. Insomnia: continue trazodone  100mg  HS   11/10- pt on Trazodone  100 mg at bedtime- pt didn't remember when offered to start her on Trazodone - scheduled- will see how does 2nd night after therapies today.   5. Neuropsych/cognition: This patient is usually capable of making decisions on her own behalf.   6. Seizure like activity: EEG was negative, Neurology has recommended Vimpat  taper and outpatient neurology follow-up to manage taper   7. Generalized anxiety disorder and depression: continue Venlafaxine .   8. Dysarthria: appears to have greatly improved, does not appear to require SLP    9.Encephalopathy- : also appears to have greatly improved, will not order SLP at this time   10. Chronic diarrhea: continue outpatient GI follow-up, consider psyllium husk to help bulk stool-  11/10- pt had GI panel- was (-)- LBM 24+ hours ago- unlikely to be C Diff- will d/c Enteric precautions   11. HTN: Continue Lotensin  and amlodipine    12. Osteopenia: continue Evista    13. Punctate right hippocampal DWI abnormality: as per neurology not concerning for CVA and no need for repeat MRI, continue statin  11/10- pt's initial MRI was concerning for CVA, however f/u was less concerning per Neuro- did have pt do continuous long term EEG- was (-)-   - tapering off Vimpat . Will decrease later this week  14. Hypokalemia  11/10 - last K+ was 3.3- will recheck in AM     I spent a total of  39  minutes on total care today- >50% coordination of care- due to  Review of chart, d/w SLP lacking orders;  also review of orders and chart- will order labs for AM and went over B/B with nursing- stopped enteric precautions.    LOS: 1 days A FACE TO FACE EVALUATION WAS PERFORMED  Kamin Niblack 12/24/2023, 6:08 PM

## 2023-12-24 NOTE — Progress Notes (Signed)
 Occupational Therapy Assessment and Plan  Patient Details  Name: [name] MRN: [id number] Date of Birth: Jul 16, 1945  OT Diagnosis: muscle weakness (generalized) Rehab Potential: Rehab Potential (ACUTE ONLY): Good ELOS: 5-7 days   Today's Date: 12/24/2023 OT Individual Time: 9044-8894 OT Individual Time Calculation (min): 70 min     Hospital Problem: Active Problems:   * No active hospital problems. *   Past Medical History:  Past Medical History:  Diagnosis Date   Allergy 1980's   Sulfa drugs and penecillin   Anxiety    Cataract 2021   Corrected   Chronic kidney disease 2024   Stage 3   Depression    Headache    SINUS   Hyperlipidemia    Hypertension 2021   Osteopenia    Osteopenia    Primary localized osteoarthritis of right knee    Seizures (HCC) 10/2023   Past Surgical History:  Past Surgical History:  Procedure Laterality Date   BREAST EXCISIONAL BIOPSY Right    benign cyst removal    CARPAL TUNNEL RELEASE Right 2015   Ortho in [place]   CHOLECYSTECTOMY N/A 02/08/2022   Procedure: LAPAROSCOPIC CHOLECYSTECTOMY WITH INTRAOPERATIVE CHOLANGIOGRAM;  Surgeon: Debby Hila, MD;  Location: [facility];  Service: General;  Laterality: N/A;   EYE SURGERY  12/2019   cataracts   FRACTURE SURGERY  2016   Rt.  leg   JOINT REPLACEMENT  09/2015   Right knee   LASIK     MENISCUS REPAIR Right 2011   Orthopedic in [facility]   TOTAL KNEE ARTHROPLASTY Right 09/20/2015   TOTAL KNEE ARTHROPLASTY Right 09/20/2015   Procedure: TOTAL KNEE ARTHROPLASTY;  Surgeon: Lamar Millman, MD;  Location: [facility] OR;  Service: Orthopedics;  Laterality: Right;   Trigger Thumb Right 2015   Done in [place] at the same time as the carpal tunnel release   TUBAL LIGATION  1980    Assessment & Plan Clinical Impression:[name] is a 78 year old woman with a PMH of HTN, HLD, osteopenia, seizures, and CKD. She was hospitalized in September with AMS. She presented to [facility] on 12/16/23 with falls  and slurred speech. She had hit her head and reported nausea and diarrhea. CT head was negative for acute abnormalities. CXR revealed mild cardiomegaly with no acute infiltrates. C-spine, right knee, right rib, and left shoulder Xrs have been negative. UA was WNL. MRI showed possible punctate stroke MRA negative for LVO. LTM EEG on 11/4 is negative for seizures. LTM EEG on 11/5 was also negative for seizures. Patient is being admitted to CIR for impaired mobility and ADLs.  Patient transferred to CIR on 12/23/2023 .    Patient currently requires CGA with basic self-care skills and IADL secondary to muscle weakness, decreased cardiorespiratoy endurance, and decreased standing balance, decreased postural control, and decreased balance strategies.  Prior to hospitalization, patient could complete ADLs with independent .  Patient will benefit from skilled intervention to increase independence with basic self-care skills prior to discharge home with care partner.  Anticipate patient will require intermittent supervision and follow up outpatient.  OT - End of Session Activity Tolerance: Tolerates 10 - 20 min activity with multiple rests Endurance Deficit: Yes Endurance Deficit Description: generalized deconditioning OT Assessment Rehab Potential (ACUTE ONLY): Good OT Barriers to Discharge: None OT Patient demonstrates impairments in the following area(s): Balance;Endurance;Motor;Safety OT Basic ADL's Functional Problem(s): Bathing;Dressing;Toileting OT Advanced ADL's Functional Problem(s): Simple Meal Preparation OT Transfers Functional Problem(s): Toilet;Tub/Shower OT Additional Impairment(s): None OT Plan OT Intensity:  Minimum of 1-2 x/day, 45 to 90 minutes OT Frequency: 5 out of 7 days OT Duration/Estimated Length of Stay: 5-7 days OT Treatment/Interventions: Balance/vestibular training;DME/adaptive equipment instruction;Patient/family education;Therapeutic Activities;Psychosocial  support;Therapeutic Exercise;Functional mobility training;Self Care/advanced ADL retraining;UE/LE Strength taining/ROM;UE/LE Coordination activities;Discharge planning;Disease mangement/prevention OT Self Feeding Anticipated Outcome(s): no goal OT Basic Self-Care Anticipated Outcome(s): mod I OT Toileting Anticipated Outcome(s): mod I OT Bathroom Transfers Anticipated Outcome(s): mod I OT Recommendation Recommendations for Other Services: None Patient destination: Home Follow Up Recommendations: Outpatient OT Equipment Recommended: To be determined   OT Evaluation Precautions/Restrictions  Precautions Precautions: Fall Restrictions Weight Bearing Restrictions Per Provider Order: No General Chart Reviewed: Yes Family/Caregiver Present: No Pain Pain Assessment Pain Scale: 0-10 Pain Score: 0-No pain Home Living/Prior Functioning Home Living Family/patient expects to be discharged to:: Private residence Living Arrangements: Spouse/significant other, Children Available Help at Discharge: Family, Available 24 hours/day Type of Home: House Home Access: Stairs to enter [employer] of Steps: 2 Entrance Stairs-Rails: Right Home Layout: One level Bathroom Shower/Tub: Health Visitor: Handicapped height Bathroom Accessibility: No  Lives With: Spouse IADL History Homemaking Responsibilities: Yes Meal Prep Responsibility: Secondary Laundry Responsibility: Secondary Cleaning Responsibility: Secondary Bill Paying/Finance Responsibility: Secondary Shopping Responsibility: Secondary Current License: Yes Mode of Transportation: Car Occupation: Retired Leisure and Hobbies: Loves to read, takes walks, spend time with grandchildren IADL Comments: Was using a RW as of recent Prior Function Level of Independence: Independent with basic ADLs, Independent with transfers, Independent with gait Driving: Yes Vocation: Retired Administrator, Sports Baseline Vision/History: 1  Wears glasses Ability to See in Adequate Light: 0 Adequate Patient Visual Report: No change from baseline Vision Assessment?: No apparent visual deficits Perception  Perception: Within Functional Limits Praxis Praxis: WFL Cognition Cognition Overall Cognitive Status: Within Functional Limits for tasks assessed Arousal/Alertness: Awake/alert Memory: Appears intact Attention: Alternating Focused Attention: Appears intact Awareness: Appears intact Problem Solving: Appears intact Safety/Judgment: Appears intact Brief Interview for Mental Status (BIMS) Repetition of Three Words (First Attempt): 3 Temporal Orientation: Year: Correct Temporal Orientation: Month: Accurate within 5 days Temporal Orientation: Day: Correct Recall: Sock: Yes, no cue required Recall: Blue: Yes, no cue required Recall: Bed: Yes, no cue required BIMS Summary Score: 15 Sensation Sensation Light Touch: Appears Intact Hot/Cold: Appears Intact Proprioception: Appears Intact Coordination Gross Motor Movements are Fluid and Coordinated: No Fine Motor Movements are Fluid and Coordinated: No Coordination and Movement Description: Tremor in UE, L>R. Generalized weakness Motor  Motor Motor: Other (comment) Motor - Skilled Clinical Observations: generalized weakness  Trunk/Postural Assessment  Cervical Assessment Cervical Assessment: Exceptions to [facility] (forward head) Thoracic Assessment Thoracic Assessment: Exceptions to [facility] (rounded shoulders) Lumbar Assessment Lumbar Assessment: Within Functional Limits Postural Control Postural Control: Deficits on evaluation Righting Reactions: delayed  Balance Balance Balance Assessed: Yes Static Sitting Balance Static Sitting - Balance Support: No upper extremity supported Static Sitting - Level of Assistance: 7: Independent Dynamic Sitting Balance Dynamic Sitting - Balance Support: No upper extremity supported Dynamic Sitting - Level of Assistance: 6:  Modified independent (Device/Increase time) Static Standing Balance Static Standing - Balance Support: Bilateral upper extremity supported Static Standing - Level of Assistance: 5: Stand by assistance (CGA) Dynamic Standing Balance Dynamic Standing - Balance Support: Bilateral upper extremity supported Dynamic Standing - Level of Assistance: 4: Min assist Extremity/Trunk Assessment RUE Assessment RUE Assessment: Exceptions to [facility] Active Range of Motion (AROM) Comments: 4-/5 General Strength Comments: baseline tremor LUE Assessment LUE Assessment: Exceptions to [facility] Active Range of Motion (AROM) Comments: 4-/5 General Strength Comments:  baseline tremor  Care Tool Care Tool Self Care Eating   Eating Assist Level: Supervision/Verbal cueing    Oral Care    Oral Care Assist Level: Supervision/Verbal cueing    Bathing   Body parts bathed by patient: Right arm;Left arm;Chest;Abdomen;Buttocks;Front perineal area;Right upper leg;Left upper leg;Right lower leg;Left lower leg;Face     Assist Level: Contact Guard/Touching assist    Upper Body Dressing(including orthotics)   What is the patient wearing?: Pull over shirt   Assist Level: Supervision/Verbal cueing    Lower Body Dressing (excluding footwear)   What is the patient wearing?: Pants;Underwear/pull up Assist for lower body dressing: Contact Guard/Touching assist    Putting on/Taking off footwear   What is the patient wearing?: Non-skid slipper socks Assist for footwear: Supervision/Verbal cueing       Care Tool Toileting Toileting activity   Assist for toileting: Contact Guard/Touching assist     Care Tool Bed Mobility Roll left and right activity   Roll left and right assist level: Supervision/Verbal cueing    Sit to lying activity   Sit to lying assist level: Supervision/Verbal cueing    Lying to sitting on side of bed activity   Lying to sitting on side of bed assist level: the ability to move from lying on the  back to sitting on the side of the bed with no back support.: Supervision/Verbal cueing     Care Tool Transfers Sit to stand transfer   Sit to stand assist level: Contact Guard/Touching assist    Chair/bed transfer   Chair/bed transfer assist level: Contact Guard/Touching assist     Toilet transfer   Assist Level: Contact Guard/Touching assist     Care Tool Cognition  Expression of Ideas and Wants Expression of Ideas and Wants: 4. Without difficulty (complex and basic) - expresses complex messages without difficulty and with speech that is clear and easy to understand  Understanding Verbal and Non-Verbal Content Understanding Verbal and Non-Verbal Content: 4. Understands (complex and basic) - clear comprehension without cues or repetitions   Memory/Recall Ability Memory/Recall Ability : Current season;That he or she is in a hospital/hospital unit   Refer to Care Plan for Long Term Goals  SHORT TERM GOAL WEEK 1 OT Short Term Goal 1 (Week 1): STG= LTG d/t ELOS  Recommendations for other services: None    Skilled Therapeutic Intervention ADL ADL Eating: Supervision/safety Grooming: Supervision/safety Where Assessed-Grooming: Sitting at sink Upper Body Bathing: Supervision/safety Where Assessed-Upper Body Bathing: Shower Lower Body Bathing: Contact guard Where Assessed-Lower Body Bathing: Shower Upper Body Dressing: Supervision/safety Where Assessed-Upper Body Dressing: Edge of bed Lower Body Dressing: Contact guard Where Assessed-Lower Body Dressing: Edge of bed Toileting: Contact guard Where Assessed-Toileting: Teacher, Adult Education: Furniture Conservator/restorer Method: Biomedical Scientist Method: Designer, Industrial/product: Information systems manager with back Mobility  Bed Mobility Bed Mobility: Sit to Supine;Supine to Sit Supine to Sit: Supervision/Verbal cueing Sit to Supine: Supervision/Verbal  cueing Transfers Sit to Stand: Contact Guard/Touching assist Stand to Sit: Contact Guard/Touching assist   Skilled OT evaluation completed with the creation of pt centered OT POC. Pt educated on condition, ELOS, rehab expectations, RW management and fall risk reduction strategies throughout session. Pt presents to OT with deficits in generalized weakness, balance deficits, and BUE tremors that impact her ability to perform ADLs with PLOF. Pt completed a full shower as described above. She was very receptive to education and will need continued education on RW management, safety,  and fall risk reduction. Pt was left sitting up in the recliner  with all needs met, chair alarm set, and call bell within reach.    Discharge Criteria: Patient will be discharged from OT if patient refuses treatment 3 consecutive times without medical reason, if treatment goals not met, if there is a change in medical status, if patient makes no progress towards goals or if patient is discharged from hospital.  The above assessment, treatment plan, treatment alternatives and goals were discussed and mutually agreed upon: by patient  Nena VEAR Moats 12/24/2023, 10:40 AM

## 2023-12-24 NOTE — Progress Notes (Incomplete)
 Physical Therapy Assessment and Plan  Patient Details  Name: [name] MRN: [id number] Date of Birth: April 27, 1945  PT Diagnosis: {diagnoses:3041673} Rehab Potential:   ELOS:     {CHL IP REHAB PT TIME CALCULATION:304800500}   Hospital Problem: Active Problems:   * No active hospital problems. *   Past Medical History:  Past Medical History:  Diagnosis Date   Allergy 1980's   Sulfa drugs and penecillin   Anxiety    Cataract 2021   Corrected   Chronic kidney disease 2024   Stage 3   Depression    Headache    SINUS   Hyperlipidemia    Hypertension 2021   Osteopenia    Osteopenia    Primary localized osteoarthritis of right knee    Seizures (HCC) 10/2023   Past Surgical History:  Past Surgical History:  Procedure Laterality Date   BREAST EXCISIONAL BIOPSY Right    benign cyst removal    CARPAL TUNNEL RELEASE Right 2015   Ortho in [place]   CHOLECYSTECTOMY N/A 02/08/2022   Procedure: LAPAROSCOPIC CHOLECYSTECTOMY WITH INTRAOPERATIVE CHOLANGIOGRAM;  Surgeon: Debby Hila, MD;  Location: [facility];  Service: General;  Laterality: N/A;   EYE SURGERY  12/2019   cataracts   FRACTURE SURGERY  2016   Rt.  leg   JOINT REPLACEMENT  09/2015   Right knee   LASIK     MENISCUS REPAIR Right 2011   Orthopedic in [facility]   TOTAL KNEE ARTHROPLASTY Right 09/20/2015   TOTAL KNEE ARTHROPLASTY Right 09/20/2015   Procedure: TOTAL KNEE ARTHROPLASTY;  Surgeon: Lamar Millman, MD;  Location: [facility] OR;  Service: Orthopedics;  Laterality: Right;   Trigger Thumb Right 2015   Done in [facility] at the same time as the carpal tunnel release   TUBAL LIGATION  1980    Assessment & Plan Clinical Impression: Patient is a 78 y.o. year old female with recent admission to the hospital on *** with ***.  Patient transferred to CIR on 12/23/2023 .   Patient currently requires {ONJ:6958369} with mobility secondary to {impairments:3041632}.  Prior to hospitalization, patient was {ONJ:6958369} with  mobility and lived with Spouse in a House home.  Home access is 2Stairs to enter.  Patient will benefit from skilled PT intervention to {benefits:22816} for planned discharge {planned discharge:3041670}.  Anticipate patient will {follow le:6958327} at discharge.      PT Evaluation Precautions/Restrictions Precautions Precautions: Fall Restrictions Weight Bearing Restrictions Per Provider Order: No General   Vital SignsTherapy Vitals Temp: 98.1 F (36.7 C) Temp Source: Oral Pulse Rate: 85 Resp: 17 BP: (!) 148/67 Patient Position (if appropriate): Sitting Oxygen Therapy SpO2: 97 % O2 Device: Room Air Pain Pain Assessment Pain Scale: 0-10 Pain Score: 4  Pain Location: Back Pain Intervention(s): Medication (See eMAR) Pain Interference Pain Interference Pain Effect on Sleep: 1. Rarely or not at all Pain Interference with Therapy Activities: 1. Rarely or not at all Pain Interference with Day-to-Day Activities: 3. Frequently Home Living/Prior Functioning Home Living Living Arrangements: Spouse/significant other Available Help at Discharge: Family;Available 24 hours/day Type of Home: House Home Access: Stairs to enter [employer] of Steps: 2 Entrance Stairs-Rails: Right Home Layout: One level  Lives With: Spouse Prior Function Level of Independence: Independent with gait;Independent with transfers Vision/Perception  Vision - History Ability to See in Adequate Light: 0 Adequate Perception Perception: Within Functional Limits Praxis Praxis: WFL  Cognition Overall Cognitive Status: Within Functional Limits for tasks assessed Arousal/Alertness: Awake/alert Orientation Level: Oriented X4 Year: 2025 Month: November  Day of Week: Correct Focused Attention: Impaired (pt reports difficulty with memory) Sensation Sensation Light Touch: Appears Intact Proprioception: Appears Intact Stereognosis: Not tested Coordination Gross Motor Movements are Fluid and  Coordinated: No Fine Motor Movements are Fluid and Coordinated: No Coordination and Movement Description: Tremor in UE, L>R. Generalized weakness 9 Hole Peg Test: R: 36.02, L: 44.01 Motor  Motor Motor: Other (comment) Motor - Skilled Clinical Observations: generalized weakness   Trunk/Postural Assessment     Balance   Extremity Assessment      RLE Assessment RLE Assessment: Exceptions to [facility] General Strength Comments: 4/5 grossly LLE Assessment LLE Assessment: Exceptions to [facility] General Strength Comments: 4-/5 grossly  Care Tool Care Tool Bed Mobility Roll left and right activity   Roll left and right assist level: Supervision/Verbal cueing    Sit to lying activity   Sit to lying assist level: Supervision/Verbal cueing    Lying to sitting on side of bed activity   Lying to sitting on side of bed assist level: the ability to move from lying on the back to sitting on the side of the bed with no back support.: Supervision/Verbal cueing     Care Tool Transfers Sit to stand transfer   Sit to stand assist level: Contact Guard/Touching assist    Chair/bed transfer   Chair/bed transfer assist level: Contact Guard/Touching assist    Car transfer   Car transfer assist level: Contact Guard/Touching assist      Care Tool Locomotion Ambulation   Assist level: Contact Guard/Touching assist Assistive device: Walker-rolling Max distance: 100  Walk 10 feet activity   Assist level: Contact Guard/Touching assist Assistive device: Walker-rolling   Walk 50 feet with 2 turns activity   Assist level: Contact Guard/Touching assist Assistive device: Walker-rolling  Walk 150 feet activity Walk 150 feet activity did not occur: Safety/medical concerns (fatigue, SOB)      Walk 10 feet on uneven surfaces activity   Assist level: Contact Guard/Touching assist Assistive device: Walker-rolling  Stairs   Assist level: Contact Guard/Touching assist Stairs assistive device: 2 hand  rails Max number of stairs: 12  Walk up/down 1 step activity   Walk up/down 1 step (curb) assist level: Contact Guard/Touching assist Walk up/down 1 step or curb assistive device: 2 hand rails  Walk up/down 4 steps activity   Walk up/down 4 steps assist level: Contact Guard/Touching assist Walk up/down 4 steps assistive device: 2 hand rails  Walk up/down 12 steps activity   Walk up/down 12 steps assist level: Contact Guard/Touching assist Walk up/down 12 steps assistive device: 2 hand rails  Pick up small objects from floor Pick up small object from the floor (from standing position) activity did not occur: Safety/medical concerns      Wheelchair Is the patient using a wheelchair?: Yes Type of Wheelchair: Manual   Wheelchair assist level: Supervision/Verbal cueing Max wheelchair distance: 150  Wheel 50 feet with 2 turns activity   Assist Level: Supervision/Verbal cueing  Wheel 150 feet activity   Assist Level: Supervision/Verbal cueing    Refer to Care Plan for Long Term Goals  SHORT TERM GOAL WEEK 1    Recommendations for other services: {RECOMMENDATIONS FOR OTHER SERVICES:3049016}  Skilled Therapeutic Intervention Mobility Bed Mobility Bed Mobility: (P) Sit to Supine;Supine to Sit Supine to Sit: (P) Supervision/Verbal cueing Sit to Supine: (P) Supervision/Verbal cueing Locomotion      Discharge Criteria: Patient will be discharged from PT if patient refuses treatment 3 consecutive times without medical reason, if treatment  goals not met, if there is a change in medical status, if patient makes no progress towards goals or if patient is discharged from hospital.  The above assessment, treatment plan, treatment alternatives and goals were discussed and mutually agreed upon: {Assessment/Treatment Plan Discussed/Agreed:3049017}  Schuyler JAYSON Batter 12/24/2023, 4:01 PM

## 2023-12-25 DIAGNOSIS — G9341 Metabolic encephalopathy: Secondary | ICD-10-CM | POA: Diagnosis not present

## 2023-12-25 LAB — COMPREHENSIVE METABOLIC PANEL WITH GFR
ALT: 124 U/L — ABNORMAL HIGH (ref 0–44)
AST: 119 U/L — ABNORMAL HIGH (ref 15–41)
Albumin: 3.2 g/dL — ABNORMAL LOW (ref 3.5–5.0)
Alkaline Phosphatase: 97 U/L (ref 38–126)
Anion gap: 10 (ref 5–15)
BUN: 16 mg/dL (ref 8–23)
CO2: 23 mmol/L (ref 22–32)
Calcium: 8.8 mg/dL — ABNORMAL LOW (ref 8.9–10.3)
Chloride: 107 mmol/L (ref 98–111)
Creatinine, Ser: 1.09 mg/dL — ABNORMAL HIGH (ref 0.44–1.00)
GFR, Estimated: 52 mL/min — ABNORMAL LOW (ref >60)
Glucose, Bld: 103 mg/dL — ABNORMAL HIGH (ref 70–99)
Potassium: 3.3 mmol/L — ABNORMAL LOW (ref 3.5–5.1)
Sodium: 140 mmol/L (ref 135–145)
Total Bilirubin: 0.4 mg/dL (ref 0.0–1.2)
Total Protein: 6.2 g/dL — ABNORMAL LOW (ref 6.5–8.1)

## 2023-12-25 LAB — CBC WITH DIFFERENTIAL/PLATELET
Abs Immature Granulocytes: 0.06 K/uL (ref 0.00–0.07)
Basophils Absolute: 0.1 K/uL (ref 0.0–0.1)
Basophils Relative: 1 %
Eosinophils Absolute: 0.2 K/uL (ref 0.0–0.5)
Eosinophils Relative: 3 %
HCT: 31.4 % — ABNORMAL LOW (ref 36.0–46.0)
Hemoglobin: 10.3 g/dL — ABNORMAL LOW (ref 12.0–15.0)
Immature Granulocytes: 1 %
Lymphocytes Relative: 28 %
Lymphs Abs: 2.4 K/uL (ref 0.7–4.0)
MCH: 27.5 pg (ref 26.0–34.0)
MCHC: 32.8 g/dL (ref 30.0–36.0)
MCV: 83.7 fL (ref 80.0–100.0)
Monocytes Absolute: 0.6 K/uL (ref 0.1–1.0)
Monocytes Relative: 7 %
Neutro Abs: 5.4 K/uL (ref 1.7–7.7)
Neutrophils Relative %: 60 %
Platelets: 389 K/uL (ref 150–400)
RBC: 3.75 MIL/uL — ABNORMAL LOW (ref 3.87–5.11)
RDW: 17.2 % — ABNORMAL HIGH (ref 11.5–15.5)
WBC: 8.8 K/uL (ref 4.0–10.5)
nRBC: 0 % (ref 0.0–0.2)

## 2023-12-25 LAB — GLUCOSE, CAPILLARY
Glucose-Capillary: 110 mg/dL — ABNORMAL HIGH (ref 70–99)
Glucose-Capillary: 125 mg/dL — ABNORMAL HIGH (ref 70–99)

## 2023-12-25 MED ORDER — TEMAZEPAM 7.5 MG PO CAPS
15.0000 mg | ORAL_CAPSULE | Freq: Every day | ORAL | Status: DC
  Administered 2023-12-25: 15 mg via ORAL
  Filled 2023-12-25: qty 2

## 2023-12-25 MED ORDER — POTASSIUM CHLORIDE CRYS ER 10 MEQ PO TBCR
10.0000 meq | EXTENDED_RELEASE_TABLET | Freq: Every day | ORAL | Status: DC
  Administered 2023-12-26 – 2023-12-27 (×2): 10 meq via ORAL
  Filled 2023-12-25 (×2): qty 1

## 2023-12-25 MED ORDER — POTASSIUM CHLORIDE CRYS ER 20 MEQ PO TBCR
40.0000 meq | EXTENDED_RELEASE_TABLET | Freq: Once | ORAL | Status: AC
  Administered 2023-12-25: 40 meq via ORAL
  Filled 2023-12-25: qty 2

## 2023-12-25 MED ORDER — LOPERAMIDE HCL 2 MG PO CAPS
2.0000 mg | ORAL_CAPSULE | Freq: Two times a day (BID) | ORAL | Status: DC | PRN
  Administered 2023-12-25: 2 mg via ORAL
  Filled 2023-12-25: qty 1

## 2023-12-25 NOTE — Progress Notes (Signed)
 Occupational Therapy Note  Patient Details  Name: Josy Peaden MRN: [id number] Date of Birth: 1945-08-20   Occupational Therapy Assistant participated in the interdisciplinary team conference, providing clinical information regarding the patient's current status, treatment goals, and weekly focus, including any barriers that need to be addressed. Please see the Inpatient Rehabilitation Team Conference and Plan of Care Update for further details.    Maritza Ned [facility] 12/25/2023, 3:16 PM

## 2023-12-25 NOTE — Plan of Care (Signed)
  Problem: Consults Goal: Skin Care Protocol Initiated - if Braden Score 18 or less Description: If consults are not indicated, leave blank or document N/A Outcome: Progressing   Problem: RH BOWEL ELIMINATION Goal: RH STG MANAGE BOWEL WITH ASSISTANCE Description: STG Manage Bowel with supervision Assistance. Outcome: Progressing   Problem: RH SKIN INTEGRITY Goal: RH STG SKIN FREE OF INFECTION/BREAKDOWN Description: Manage skin free of infection with supervision assistance Outcome: Progressing Goal: RH STG MAINTAIN SKIN INTEGRITY WITH ASSISTANCE Description: STG Maintain Skin Integrity With Assistance. Outcome: Progressing   Problem: RH SAFETY Goal: RH STG ADHERE TO SAFETY PRECAUTIONS W/ASSISTANCE/DEVICE Description: STG Adhere to Safety Precautions With supervision Assistance/Device. Outcome: Progressing   Problem: RH PAIN MANAGEMENT Goal: RH STG PAIN MANAGED AT OR BELOW PT'S PAIN GOAL Description: <4 w prns Outcome: Progressing   Problem: RH KNOWLEDGE DEFICIT GENERAL Goal: RH STG INCREASE KNOWLEDGE OF SELF CARE AFTER HOSPITALIZATION Description: Manage increase knowledge of self care after hospitalization with supervision assistance from husband using educational materials provided Outcome: Progressing   Problem: Consults Goal: RH GENERAL PATIENT EDUCATION Description: See Patient Education module for education specifics. Outcome: Progressing   Problem: RH BLADDER ELIMINATION Goal: RH STG MANAGE BLADDER WITH ASSISTANCE Description: STG Manage Bladder With supervision Assistance Outcome: Progressing   Problem: RH SKIN INTEGRITY Goal: RH STG SKIN FREE OF INFECTION/BREAKDOWN Outcome: Progressing   Problem: RH SAFETY Goal: RH STG ADHERE TO SAFETY PRECAUTIONS W/ASSISTANCE/DEVICE Description: STG Adhere to Safety Precautions With supervision  Assistance/Device. Outcome: Progressing   Problem: RH PAIN MANAGEMENT Goal: RH STG PAIN MANAGED AT OR BELOW PT'S PAIN  GOAL Outcome: Progressing   Problem: RH KNOWLEDGE DEFICIT GENERAL Goal: RH STG INCREASE KNOWLEDGE OF SELF CARE AFTER HOSPITALIZATION Description: Manage increase knowledge of self care after hospitalization with supervision assistance from husband using educatonal materials provided Outcome: Progressing

## 2023-12-25 NOTE — Discharge Instructions (Addendum)
 Inpatient Rehab Discharge Instructions  [name] Discharge date and time:  12/27/23  Activities/Precautions/ Functional Status: Activity: no lifting, driving, or strenuous exercise till cleared by MD Diet: regular diet Wound Care: none needed   Functional status:  ___ No restrictions     ___ Walk up steps independently ___ 24/7 supervision/assistance   ___ Walk up steps with assistance ___ Intermittent supervision/assistance  ___ Bathe/dress independently ___ Walk with walker     ___ Bathe/dress with assistance ___ Walk Independently    ___ Shower independently ___ Walk with assistance    ___ Shower with assistance _X__ No alcohol     ___ Return to work/school ________   Special Instructions: Need to discontinue use of all Cannabis products to avoid encephalopathy/stroke/seizure risk.  Family needs to assist with medication/supplement management.   Per [place] statutes, patients with seizures are not allowed to drive until  they have been seizure-free for six months. Use caution when using heavy equipment or power tools. Avoid working on ladders or at heights. Take showers instead of baths. Ensure the water temperature is not too high on the home water heater. Do not go swimming alone. When caring for infants or small children, sit down when holding, feeding, or changing them to minimize risk of injury to the child in the event you have a seizure. Also, Maintain good sleep hygiene. Avoid alcohol.     COMMUNITY REFERRALS UPON DISCHARGE:    Outpatient: PT     OT                Agency: [facility] [place] location       Phone: [phone]             Appointment Date/Time: *Please expect follow-up within 7-10 business days to schedule your appointment. If you have not received follow-up, be sure to contact the site directly.*      My questions have been answered and I understand these instructions. I will adhere to these goals and the provided  educational materials after my discharge from the hospital.  Patient/Caregiver Signature _______________________________ Date __________  Clinician Signature _______________________________________ Date __________  Please bring this form and your medication list with you to all your follow-up doctor's appointments.

## 2023-12-25 NOTE — Care Management (Signed)
 Inpatient Rehabilitation Center Individual Statement of Services  Patient Name:  [name]  Date:  12/25/2023  Welcome to the Inpatient Rehabilitation Center.  Our goal is to provide you with an individualized program based on your diagnosis and situation, designed to meet your specific needs.  With this comprehensive rehabilitation program, you will be expected to participate in at least 3 hours of rehabilitation therapies Monday-Friday, with modified therapy programming on the weekends.  Your rehabilitation program will include the following services:  Physical Therapy (PT), Occupational Therapy (OT), Speech Therapy (ST), 24 hour per day rehabilitation nursing, Therapeutic Recreaction (TR), Psychology, Neuropsychology, Care Coordinator, Rehabilitation Medicine, Nutrition Services, Pharmacy Services, and Other  Weekly team conferences will be held on Tuesday to discuss your progress.  Your Inpatient Rehabilitation Care Coordinator will talk with you frequently to get your input and to update you on team discussions.  Team conferences with you and your family in attendance may also be held.  Expected length of stay: 5-7 days    Overall anticipated outcome: Supervision  Depending on your progress and recovery, your program may change. Your Inpatient Rehabilitation Care Coordinator will coordinate services and will keep you informed of any changes. Your Inpatient Rehabilitation Care Coordinator's name and contact numbers are listed  below.  The following services may also be recommended but are not provided by the Inpatient Rehabilitation Center:  Driving Evaluations Home Health Rehabiltiation Services Outpatient Rehabilitation Services Vocational Rehabilitation   Arrangements will be made to provide these services after discharge if needed.  Arrangements include referral to agencies that provide these services.  Your insurance has been verified to be:  Medicare A/B  Your primary  doctor is:  [name]  Pertinent information will be shared with your doctor and your insurance company.  Inpatient Rehabilitation Care Coordinator:  Graeme [name], KEN [phone] or [phone]  Information discussed with and copy given to patient by: [name], 12/25/2023, 9:40 AM

## 2023-12-25 NOTE — Progress Notes (Addendum)
 PROGRESS NOTE   Subjective/Complaints:  Pt reports doing well except still didn't sleep a wink overnight- doesn't think Trazodone  helpful- wants to try something else.  Explained we can try Restoril , but will need PCP to continue it if she needs it at home.   ROS:  Per HPI   Pt denies SOB, abd pain, CP, N/V/C/D, and vision changes     Objective:   No results found. Recent Labs    12/25/23 0449  WBC 8.8  HGB 10.3*  HCT 31.4*  PLT 389   Recent Labs    12/25/23 0449  NA 140  K 3.3*  CL 107  CO2 23  GLUCOSE 103*  BUN 16  CREATININE 1.09*  CALCIUM  8.8*    Intake/Output Summary (Last 24 hours) at 12/25/2023 0845 Last data filed at 12/25/2023 0818 Gross per 24 hour  Intake 1024 ml  Output --  Net 1024 ml        Physical Exam: Vital Signs Blood pressure (!) 150/56, pulse 91, temperature 98.3 F (36.8 C), resp. rate 18, height 5' 2 (1.575 m), weight 84.4 kg, SpO2 93%.     General: awake, alert, appropriate,  sitting on Edge of mat- with OT; walked to gym with RW from  apartment with w/c follow; NAD HENT: conjugate gaze; oropharynx moist CV: regular rate and rhythm; no JVD Pulmonary: CTA B/L; no W/R/R- good air movement GI: soft, NT, ND, (+)BS- normoactive Psychiatric: appropriate Neurological: Ox3    Assessment/Plan: 1. Functional deficits which require 3+ hours per day of interdisciplinary therapy in a comprehensive inpatient rehab setting. Physiatrist is providing close team supervision and 24 hour management of active medical problems listed below. Physiatrist and rehab team continue to assess barriers to discharge/monitor patient progress toward functional and medical goals  Care Tool:  Bathing    Body parts bathed by patient: Right arm, Left arm, Chest, Abdomen, Buttocks, Front perineal area, Right upper leg, Left upper leg, Right lower leg, Left lower leg, Face         Bathing  assist Assist Level: Contact Guard/Touching assist     Upper Body Dressing/Undressing Upper body dressing   What is the patient wearing?: Pull over shirt    Upper body assist Assist Level: Independent with assistive device    Lower Body Dressing/Undressing Lower body dressing      What is the patient wearing?: Pants, Underwear/pull up     Lower body assist Assist for lower body dressing: Supervision/Verbal cueing     Toileting Toileting    Toileting assist Assist for toileting: Contact Guard/Touching assist     Transfers Chair/bed transfer  Transfers assist     Chair/bed transfer assist level: Contact Guard/Touching assist     Locomotion Ambulation   Ambulation assist      Assist level: Contact Guard/Touching assist Assistive device: Walker-rolling Max distance: 100   Walk 10 feet activity   Assist     Assist level: Contact Guard/Touching assist Assistive device: Walker-rolling   Walk 50 feet activity   Assist    Assist level: Contact Guard/Touching assist Assistive device: Walker-rolling    Walk 150 feet activity   Assist Walk 150 feet activity did  not occur: Safety/medical concerns (fatigue, SOB)         Walk 10 feet on uneven surface  activity   Assist     Assist level: Contact Guard/Touching assist Assistive device: Walker-rolling   Wheelchair     Assist Is the patient using a wheelchair?: Yes Type of Wheelchair: Manual    Wheelchair assist level: Supervision/Verbal cueing Max wheelchair distance: 150    Wheelchair 50 feet with 2 turns activity    Assist        Assist Level: Supervision/Verbal cueing   Wheelchair 150 feet activity     Assist      Assist Level: Supervision/Verbal cueing   Blood pressure (!) 150/56, pulse 91, temperature 98.3 F (36.8 C), resp. rate 18, height 5' 2 (1.575 m), weight 84.4 kg, SpO2 93%.  Medical Problem List and Plan: 1. Debility due to  encephalopathy/questionable seizures/dehydration             -patient may shower             -ELOS/Goals: 10-14 days S             Admit to CIR   Con't CIR PT and OT and SLP for swallowing  Team conference today to determine LOS 2.  Antithrombotics: -DVT/anticoagulation:  Heparin             -antiplatelet therapy: aspirin 81 mg daily   11/10- change Heparin to Lovenox - renal levels back to baseline 3. Pain: continue tylenol  q6H scheduled   4. Insomnia: continue trazodone  100mg  HS   11/10- pt on Trazodone  100 mg at bedtime- pt didn't remember when offered to start her on Trazodone - scheduled- will see how does 2nd night after therapies today.   11/11- changed to Restoril  15 mg at bedtime- will need to get from PCP if needs after d/c 5. Neuropsych/cognition: This patient is  capable of making decisions on her own behalf.   6. Seizure like activity: EEG was negative, Neurology has recommended Vimpat  taper and outpatient neurology follow-up to manage taper   - do NOT taper Vimpat - will need Neuro f/u.  7. Generalized anxiety disorder and depression: continue Venlafaxine .   8. Dysarthria: appears to have greatly improved, does not appear to require SLP    9.Encephalopathy- : also appears to have greatly improved, will not order SLP at this time   10. Chronic diarrhea: continue outpatient GI follow-up, consider psyllium husk to help bulk stool-  11/10- pt had GI panel- was (-)- LBM 24+ hours ago- unlikely to be C Diff- will d/c Enteric precautions   11. HTN: Continue Lotensin  and amlodipine    12. Osteopenia: continue Evista    13. Punctate right hippocampal DWI abnormality: as per neurology not concerning for CVA and no need for repeat MRI, continue statin  11/10- pt's initial MRI was concerning for CVA, however f/u was less concerning per Neuro- did have pt do continuous long term EEG- was (-)-   - tapering off Vimpat . Will decrease later this week  14. Hypokalemia  11/10 - last K+ was  3.3- will recheck in AM    11/11- K+ 3.3- will replete with 40 Meq x1 and 10 mEq daily starting tomorrow-     I spent a total of 39   minutes on total care today- >50% coordination of care- due to  D/w pt and OT about pt's level of function- also with SLP about swallowing- doesn't need for cognition- also team conference to determine LOS   LOS: 2  days A FACE TO FACE EVALUATION WAS PERFORMED  Knute Mazzuca 12/25/2023, 8:45 AM

## 2023-12-25 NOTE — Patient Care Conference (Signed)
 Inpatient RehabilitationTeam Conference and Plan of Care Update Date: 12/25/2023   Time: 1105 am    Patient Name: [name]      Medical Record Number: [id number]  Date of Birth: Feb 06, 1946 Sex: Female         Room/Bed: 4M11C/4M11C-01 Payor Info: Payor: MEDICARE / Plan: MEDICARE PART A AND B / Product Type: *No Product type* /    Admit Date/Time:  12/23/2023  4:13 PM  Primary Diagnosis:  Debility  Hospital Problems: Principal Problem:   Debility Active Problems:   Encephalopathy, metabolic    Expected Discharge Date: Expected Discharge Date: 12/27/23  Team Members Present: Physician leading conference: Dr. Duwaine Barrs Social Worker Present: Graeme [name], LCSW Nurse Present: Eulalio Falls, RN PT Present: Schuyler Batter, PT OT Present: Charlena Cha, Darrol Hoe, OT     Current Status/Progress Goal Weekly Team Focus  Bowel/Bladder   Patient is continent of bowel and bladder. Last BM 11/10 per patient. Last BM charted on 10/8   Patient will remain continent of bowel and bladder.   Assess patient's need to use the bathroom while in room. Assist patient to the bathroom in a timely manner to ensure continence.    Swallow/Nutrition/ Hydration               ADL's   CGA/supervisin bathing/dressing and functional transfers   mod I overall   activity tolerance, education, discharge planning    Mobility   CGA-supervision, limited by endurance but rapidly improving   supervision to mod i  endurance, global strength    Communication                Safety/Cognition/ Behavioral Observations               Pain   Patient reports pain in her back. Scheduled tylenol  per MAR and PRN Robaxin.   Patient will report a pain score of less than 4/10   Assess pain every shift and as needed. Provide pain interventions when necessary.    Skin   Scattered bruising noted.   Patient will remain free of skin breakdown.  Assess skin every shift and as  needed. Provide pressure reduction measures to prevent skin breakdown. Keep skin clean and dry.      Discharge Planning:  D/c to home with her husband who will be her primary caregiver. SW will confirm there are no barriers to discharge.    Team Discussion: Patient was admitted post debility due to encephalopathy/questionable seizures/dehydration.Patient with insomia/ vertigo: medication adjusted by MD. Patient progress limited by poor endurance but rapidly improving.   Patient on target to meet rehab goals: yes, currently patient needs CGA/supervision with ADLs, transfers and ambulation. Overall goals at discharge are set for supervision to mod I assistance.   *See Care Plan and progress notes for long and short-term goals.   Revisions to Treatment Plan:  N/a   Teaching Needs: Safety, medications, transfers, toileting , etc   Current Barriers to Discharge: Decreased caregiver support and Home enviroment access/layout  Possible Resolutions to Barriers: Family Education Outpatient follow up DME: RW/ cane     Medical Summary Current Status: insomnia- pain mild- K+ 3.3; pain not limiting for therapy- continent- only brusiing  Barriers to Discharge: Behavior/Mood;Electrolyte abnormality;Other (comments);Self-care education;Morbid Obesity  Barriers to Discharge Comments: severe insomnia; vertigo; /floaters;- overall endurance-  mod I goals Possible Resolutions to [employer] Focus: Replaced K+; outpt PT- adde dRestoril- will d/c 11/13- Thursday   Continued Need for Acute Rehabilitation Level of  Care: The patient requires daily medical management by a physician with specialized training in physical medicine and rehabilitation for the following reasons: Direction of a multidisciplinary physical rehabilitation program to maximize functional independence : Yes Medical management of patient stability for increased activity during participation in an intensive rehabilitation regime.:  Yes Analysis of laboratory values and/or radiology reports with any subsequent need for medication adjustment and/or medical intervention. : Yes   I attest that I was present, lead the team conference, and concur with the assessment and plan of the team.   Christpoher Sievers Gayo 12/25/2023, 1105 am

## 2023-12-25 NOTE — Progress Notes (Signed)
 Occupational Therapy Note  Patient Details  Name: [name] Name MRN: [id number] Date of Birth: 12/05/1945  Today's Date: 12/25/2023 OT Missed Time: 30 Minutes Missed Time Reason: Patient fatigue  Upon arrival pt in bed with husband present. Pt reported not getting sleep last night and feeling under the weather. OT educating on importance of participating in therapies. Pt reporting too fatigued and politely declining further OT services this date. Will make up missed time as able.   Camie Hoe, OTD, OTR/L 12/25/2023, 1:47 PM

## 2023-12-25 NOTE — Progress Notes (Signed)
 Patient ID: [name], female   DOB: May 10, 1945, 78 y.o.   MRN: [id number]  SW met with pt in room to provide updates from team conference, and d/c date 11/13. SW discussed d/c recs outpatient PT/OT. Prefers [facility] locations.   1422- SW left message for pt husband to inform on above.  *SW returned phone call to pt husband to inform on above.    Graeme [name], MSW, LCSW Office: [phone] Cell: [phone] Fax: [phone]

## 2023-12-25 NOTE — Progress Notes (Signed)
 Physical Therapy Session Note  Patient Details  Name: [name] MRN: [id number] Date of Birth: 1945/12/31  Today's Date: 12/25/2023 PT Individual Time: 9154-9084 PT Individual Time Calculation (min): 30 min  and Today's Date: 12/25/2023 PT Missed Time: 75 Minutes Missed Time Reason: Patient ill (Comment) (feeling lightheaded)  Short Term Goals: Week 1:  PT Short Term Goal 1 (Week 1): =LTGs d/t ELOS  Skilled Therapeutic Interventions/Progress Updates:    pt received in bed and agreeable to therapy. Pt reports 5/10 pain in back and shoulders, rest and positioning as needed. Pt requesting imodium d/t having bout of diarrhea earlier, consulted with nsg. Bed mobility with supervision, Stand pivot transfer with supervision. Pt ambulated to ortho gym with CGA. Set up on nustep for low impact global strengthening and endurance x 5 min. At 5 min, pt reports spotty vision and light headedness. Nsg made aware. Pt requesting to lie down. CGA for Stand pivot transfer nustep>w/c>EOB. Pt moving quickly to return to bed and continues to report  feeling unwell. Given ~15 min to rest before attempting to continue therapy. At this time pt refuses any further therapy, instead requesting to rest. Pt missed x 45 min d/t feeling ill and needing to rest.    Session 2: Pt recd in bed, reports still feeling ill. Nsg present to provide medication. Pt refuses therapy at this time, missed x 30 min of therapy, will make up as able   Therapy Documentation Precautions:  Precautions Precautions: Fall Restrictions Weight Bearing Restrictions Per Provider Order: No General: PT Amount of Missed Time (min): 75 Minutes PT Missed Treatment Reason: Patient ill (Comment) (feeling lightheaded)    Therapy/Group: Individual Therapy  [name] 12/25/2023, 9:21 AM

## 2023-12-25 NOTE — Progress Notes (Signed)
 Occupational Therapy Session Note  Patient Details  Name: [name] MRN: [id number] Date of Birth: 18-Dec-1945  Today's Date: 12/25/2023 OT Individual Time: 0700-0755 OT Individual Time Calculation (min): 55 min    Short Term Goals: Week 1:  OT Short Term Goal 1 (Week 1): STG= LTG d/t ELOS  Skilled Therapeutic Interventions/Progress Updates:    Pt resting in bed upon arrival. Pt reported she only had 3 hrs sleep previous night but she wanted to begin therapy. Pt completed UB/LB dressing with sit<>stand from EOB without assistance. Pt declined grooming tasks, etc. Pt amb with RW to ADL apartment and practiced furniture transfers before amb to main gym to practice std bed transfers. Pt completed all tasks with supervisoin. No LOB noted. No unsafe behaviors noted. Pt reports she will have assistance from husband at home. Pt amb with RW back to room and returned to w/c eat breakfast. Pt remained in w/c with all needs within reach.   Therapy Documentation Precautions:  Precautions Precautions: Fall Restrictions Weight Bearing Restrictions Per Provider Order: No Pain: Pain Assessment Pain Scale: 0-10 Pain Score: 4  Pain Location: Hip Pain Intervention(s): Meds admin at end of session; rest   Therapy/Group: Individual Therapy  [name] 12/25/2023, 8:02 AM

## 2023-12-26 ENCOUNTER — Other Ambulatory Visit (HOSPITAL_COMMUNITY): Payer: Self-pay

## 2023-12-26 ENCOUNTER — Telehealth (HOSPITAL_COMMUNITY): Admitting: Psychiatry

## 2023-12-26 DIAGNOSIS — F411 Generalized anxiety disorder: Secondary | ICD-10-CM

## 2023-12-26 DIAGNOSIS — R5381 Other malaise: Secondary | ICD-10-CM | POA: Diagnosis not present

## 2023-12-26 DIAGNOSIS — G9341 Metabolic encephalopathy: Secondary | ICD-10-CM | POA: Diagnosis not present

## 2023-12-26 LAB — URINALYSIS, MICROSCOPIC (REFLEX)

## 2023-12-26 LAB — URINALYSIS, ROUTINE W REFLEX MICROSCOPIC
Bilirubin Urine: NEGATIVE
Glucose, UA: NEGATIVE mg/dL
Hgb urine dipstick: NEGATIVE
Ketones, ur: NEGATIVE mg/dL
Nitrite: NEGATIVE
Protein, ur: NEGATIVE mg/dL
Specific Gravity, Urine: >1.03 — ABNORMAL HIGH (ref 1.005–1.030)
pH: 6 (ref 5.0–8.0)

## 2023-12-26 LAB — GLUCOSE, CAPILLARY: Glucose-Capillary: 98 mg/dL (ref 70–99)

## 2023-12-26 MED ORDER — POTASSIUM CHLORIDE CRYS ER 10 MEQ PO TBCR
10.0000 meq | EXTENDED_RELEASE_TABLET | Freq: Every day | ORAL | 0 refills | Status: DC
  Filled 2023-12-26: qty 14, 14d supply, fill #0

## 2023-12-26 MED ORDER — VENLAFAXINE HCL ER 150 MG PO CP24
150.0000 mg | ORAL_CAPSULE | Freq: Every day | ORAL | 0 refills | Status: DC
Start: 2023-12-26 — End: 2024-01-04
  Filled 2023-12-26: qty 30, 30d supply, fill #0

## 2023-12-26 MED ORDER — TEMAZEPAM 7.5 MG PO CAPS
7.5000 mg | ORAL_CAPSULE | Freq: Every day | ORAL | Status: DC
  Administered 2023-12-26: 7.5 mg via ORAL
  Filled 2023-12-26: qty 1

## 2023-12-26 MED ORDER — METHOCARBAMOL 500 MG PO TABS
500.0000 mg | ORAL_TABLET | Freq: Three times a day (TID) | ORAL | 0 refills | Status: DC | PRN
  Filled 2023-12-26: qty 30, 10d supply, fill #0

## 2023-12-26 MED ORDER — LACOSAMIDE 100 MG PO TABS
100.0000 mg | ORAL_TABLET | Freq: Two times a day (BID) | ORAL | 0 refills | Status: DC
  Filled 2023-12-26: qty 60, 30d supply, fill #0

## 2023-12-26 NOTE — Progress Notes (Addendum)
 PROGRESS NOTE   Subjective/Complaints:  Pt reports slept very well with Restoril - actually groggy somewhat this AM, still- slept that well. We discussed decreasing Restoril  somewhat.    Had 2 bladder accidents- was awake- had 10 seconds notice- never had since she was adult.   Did have diarrhea x1 yesterday- was continent but feels like body is betraying her.   Of note has chronic insomnia.   ROS:  Per HPI   Pt denies SOB, abd pain, CP, N/V/C/D, and vision changes    Objective:   No results found. Recent Labs    12/25/23 0449  WBC 8.8  HGB 10.3*  HCT 31.4*  PLT 389   Recent Labs    12/25/23 0449  NA 140  K 3.3*  CL 107  CO2 23  GLUCOSE 103*  BUN 16  CREATININE 1.09*  CALCIUM  8.8*    Intake/Output Summary (Last 24 hours) at 12/26/2023 9187 Last data filed at 12/26/2023 0600 Gross per 24 hour  Intake 628 ml  Output --  Net 628 ml        Physical Exam: Vital Signs Blood pressure (!) 155/67, pulse 94, temperature 97.7 F (36.5 C), resp. rate 18, height 5' 2 (1.575 m), weight 82.8 kg, SpO2 97%.      General: awake, alert, appropriate,  sitting up in bed; NAD HENT: conjugate gaze; oropharynx moist CV: regular rate and rhythm- rate in 90's; no JVD Pulmonary: CTA B/L; no W/R/R- good air movement GI: soft, NT, ND, (+)BS Psychiatric: appropriate- a little sleepy Neurological: Ox3     Assessment/Plan: 1. Functional deficits which require 3+ hours per day of interdisciplinary therapy in a comprehensive inpatient rehab setting. Physiatrist is providing close team supervision and 24 hour management of active medical problems listed below. Physiatrist and rehab team continue to assess barriers to discharge/monitor patient progress toward functional and medical goals  Care Tool:  Bathing    Body parts bathed by patient: Right arm, Left arm, Chest, Abdomen, Buttocks, Front perineal area,  Right upper leg, Left upper leg, Right lower leg, Left lower leg, Face         Bathing assist Assist Level: Contact Guard/Touching assist     Upper Body Dressing/Undressing Upper body dressing   What is the patient wearing?: Pull over shirt    Upper body assist Assist Level: Independent with assistive device    Lower Body Dressing/Undressing Lower body dressing      What is the patient wearing?: Pants, Underwear/pull up     Lower body assist Assist for lower body dressing: Supervision/Verbal cueing     Toileting Toileting    Toileting assist Assist for toileting: Contact Guard/Touching assist     Transfers Chair/bed transfer  Transfers assist     Chair/bed transfer assist level: Contact Guard/Touching assist     Locomotion Ambulation   Ambulation assist      Assist level: Contact Guard/Touching assist Assistive device: Walker-rolling Max distance: 100   Walk 10 feet activity   Assist     Assist level: Contact Guard/Touching assist Assistive device: Walker-rolling   Walk 50 feet activity   Assist    Assist level: Contact Guard/Touching assist Assistive device:  Walker-rolling    Walk 150 feet activity   Assist Walk 150 feet activity did not occur: Safety/medical concerns (fatigue, SOB)         Walk 10 feet on uneven surface  activity   Assist     Assist level: Contact Guard/Touching assist Assistive device: Walker-rolling   Wheelchair     Assist Is the patient using a wheelchair?: Yes Type of Wheelchair: Manual    Wheelchair assist level: Supervision/Verbal cueing Max wheelchair distance: 150    Wheelchair 50 feet with 2 turns activity    Assist        Assist Level: Supervision/Verbal cueing   Wheelchair 150 feet activity     Assist      Assist Level: Supervision/Verbal cueing   Blood pressure (!) 155/67, pulse 94, temperature 97.7 F (36.5 C), resp. rate 18, height 5' 2 (1.575 m), weight 82.8  kg, SpO2 97%.  Medical Problem List and Plan: 1. Debility due to encephalopathy/questionable seizures/dehydration             -patient may shower             -ELOS/Goals: 10-14 days S             Admit to CIR   D/c Thursday 11/13  Con't CIR PT and OT  Might have UTI- checking U/A and Cx 2.  Antithrombotics: -DVT/anticoagulation:  Heparin             -antiplatelet therapy: aspirin 81 mg daily   11/10- change Heparin to Lovenox - renal levels back to baseline  11/12- will not need to go home on meds 3. Pain: continue tylenol  q6H scheduled   4. Insomnia: continue trazodone  100mg  HS   11/10- pt on Trazodone  100 mg at bedtime- pt didn't remember when offered to start her on Trazodone - scheduled- will see how does 2nd night after therapies today.   11/11- changed to Restoril  15 mg at bedtime- will need to get from PCP if needs after d/c  11/12- changed to 7.5 mg at bedtime we discussed that could ask for it prn at home from PCP 5. Neuropsych/cognition: This patient is  capable of making decisions on her own behalf.   6. Seizure like activity: EEG was negative, Neurology has recommended Vimpat  taper and outpatient neurology follow-up to manage taper   - do NOT taper Vimpat - will need Neuro f/u.  7. Generalized anxiety disorder and depression: continue Venlafaxine .   8. Dysarthria: appears to have greatly improved, does not appear to require SLP    9.Encephalopathy- : also appears to have greatly improved, will not order SLP at this time   10. Chronic diarrhea: continue outpatient GI follow-up, consider psyllium husk to help bulk stool-  11/10- pt had GI panel- was (-)- LBM 24+ hours ago- unlikely to be C Diff- will d/c Enteric precautions   11/12- 1 x diarrhea last night 11. HTN: Continue Lotensin  and amlodipine    12. Osteopenia: continue Evista    13. Punctate right hippocampal DWI abnormality: as per neurology not concerning for CVA and no need for repeat MRI, continue statin  11/10-  pt's initial MRI was concerning for CVA, however f/u was less concerning per Neuro- did have pt do continuous long term EEG- was (-)-   - tapering off Vimpat . Will decrease later this week  14. Hypokalemia  11/10 - last K+ was 3.3- will recheck in AM    11/11- K+ 3.3- will replete with 40 Meq x1 and 10 mEq daily starting  tomorrow-  15. UTI?- urinary incontinence  11/12- will check U/A and Cx- reached out to nursing to let them know.   -pending Results.     I spent a total of  39  minutes on total care today- >50% coordination of care- due to  IPOC, doing U/A and Cx- and d/w pt about incontinence. Also reached out to nurse and ordered labs to f/u tomorrow before d/c.   Addendum: No UTI on U/A  LOS: 3 days A FACE TO FACE EVALUATION WAS PERFORMED  [name] 12/26/2023, 8:12 AM

## 2023-12-26 NOTE — Progress Notes (Signed)
 Occupational Therapy Discharge Summary  Patient Details  Name: [name] MRN: [id number] Date of Birth: Nov 14, 1945  Date of Discharge from OT service:December 26, 2023  Patient has met 6 of 6 long term goals due to improved activity tolerance, improved balance, ability to compensate for deficits, and improved coordination.  Pt made excellent progress with BADLs, IADLS, and functional transfers during this admission. Pt completes BADLs with mod I using RW for tranfsers. Kitchen tasks with supervision. Walk in shower tranfsers with supervision. Reviewed energy conservation strategies and home safety. Pt fatigues quickly. Husband present for educaiton. Pt and husband verbalized understainding of recommendations. Patient to discharge at overall Modified Independent level.  Patient's care partner is independent to provide the necessary physical assistance at discharge.    Reasons goals not met: n/a  Recommendation:  Patient will benefit from ongoing skilled OT services in outpatient setting to continue to advance functional skills in the area of BADL and Reduce care partner burden.  Equipment: No equipment provided  Reasons for discharge: treatment goals met and discharge from hospital  Patient/family agrees with progress made and goals achieved: Yes  OT Discharge ADL ADL Eating: Independent Grooming: Modified independent Where Assessed-Grooming: Sitting at sink Upper Body Bathing: Modified independent Where Assessed-Upper Body Bathing: Shower Lower Body Bathing: Modified independent Where Assessed-Lower Body Bathing: Shower Upper Body Dressing: Independent Where Assessed-Upper Body Dressing: Edge of bed Lower Body Dressing: Modified independent Where Assessed-Lower Body Dressing: Edge of bed Toileting: Modified independent Where Assessed-Toileting: Teacher, Adult Education: Engineer, Agricultural Method: Event Organiser: Close  supervision Film/video Editor Method: Designer, Industrial/product: Information systems manager with back Vision Baseline Vision/History: 1 Wears glasses Patient Visual Report: No change from baseline Vision Assessment?: No apparent visual deficits Perception  Perception: Within Functional Limits Praxis Praxis: WFL Cognition Cognition Overall Cognitive Status: Within Functional Limits for tasks assessed Arousal/Alertness: Awake/alert Orientation Level: Person;Place;Situation Person: Oriented Place: Oriented Situation: Oriented Memory: Appears intact Attention: Alternating Focused Attention: Appears intact Awareness: Appears intact Problem Solving: Appears intact Safety/Judgment: Appears intact Brief Interview for Mental Status (BIMS) Repetition of Three Words (First Attempt): 3 Temporal Orientation: Year: Correct Temporal Orientation: Month: Accurate within 5 days Temporal Orientation: Day: Correct Recall: Sock: Yes, no cue required Recall: Blue: Yes, no cue required Recall: Bed: Yes, no cue required BIMS Summary Score: 15 Sensation Sensation Light Touch: Appears Intact Hot/Cold: Appears Intact Proprioception: Appears Intact Stereognosis: Not tested Coordination Gross Motor Movements are Fluid and Coordinated: No Fine Motor Movements are Fluid and Coordinated: No Coordination and Movement Description: Tremor in UE, L>R. Generalized weakness Motor  Motor Motor: Other (comment) Motor - Skilled Clinical Observations: generalized weakness Mobility  Bed Mobility Bed Mobility: Sit to Supine;Supine to Sit Supine to Sit: Independent with assistive device Sit to Supine: Independent with assistive device Transfers Sit to Stand: Independent with assistive device Stand to Sit: Independent with assistive device  Trunk/Postural Assessment  Cervical Assessment Cervical Assessment: Within Functional Limits Thoracic Assessment Thoracic Assessment: Within Functional  Limits Lumbar Assessment Lumbar Assessment: Within Functional Limits Postural Control Postural Control: Within Functional Limits  Balance Static Sitting Balance Static Sitting - Balance Support: Feet supported Static Sitting - Level of Assistance: 7: Independent Dynamic Sitting Balance Dynamic Sitting - Balance Support: During functional activity Dynamic Sitting - Level of Assistance: 6: Modified independent (Device/Increase time) Extremity/Trunk Assessment RUE Assessment RUE Assessment: Exceptions to [facility] Active Range of Motion (AROM) Comments: 4-/5 General Strength Comments: baseline tremor LUE Assessment LUE Assessment: Exceptions to [facility]  Active Range of Motion (AROM) Comments: 4-/5 General Strength Comments: baseline tremor   [name] 12/26/2023, 12:41 PM

## 2023-12-26 NOTE — Progress Notes (Signed)
 Inpatient Rehabilitation Care Coordinator Discharge Note   Patient Details  Name: [name] MRN: [id number] Date of Birth: [birth year]   Discharge location: D/c to home with husband  Length of Stay: 3 days  Discharge activity level: Supervision  Home/community participation: Limited  Patient response un:Yzjouy Literacy - How often do you need to have someone help you when you read instructions, pamphlets, or other written material from your doctor or pharmacy?: Rarely  Patient response un:Dnrpjo Isolation - How often do you feel lonely or isolated from those around you?: Rarely  Services provided included: MD, RD, PT, OT, RN, CM, Pharmacy, Neuropsych, SW, TR  Financial Services:  Financial Services Utilized: Medicare    Choices offered to/list presented to: patient  Follow-up services arranged:  Outpatient, DME    Outpatient Servicies: [facility] [place] location for PT/OT DME : [employer] for rollator    Patient response to transportation need: Is the patient able to respond to transportation needs?: Yes In the past 12 months, has lack of transportation kept you from medical appointments or from getting medications?: No In the past 12 months, has lack of transportation kept you from meetings, work, or from getting things needed for daily living?: No   Patient/Family verbalized understanding of follow-up arrangements:  Yes  Individual responsible for coordination of the follow-up plan: contact pt or pt husband  Confirmed correct DME delivered: [name] [name] [name] 12/26/2023    Comments (or additional information):  Summary of Stay    Date/Time Discharge Planning CSW  12/25/23 1109 D/c to home with her husband who will be her primary caregiver. SW will confirm there are no barriers to discharge. AAC       [name] A [name]

## 2023-12-26 NOTE — Progress Notes (Signed)
 Occupational Therapy Session Note  Patient Details  Name: [name] MRN: [id number] Date of Birth: 1945/09/30  Today's Date: 12/26/2023 OT Individual Time: 0930-1030 OT Individual Time Calculation (min): 60 min    Short Term Goals: Week 1:  OT Short Term Goal 1 (Week 1): STG= LTG d/t ELOS  Skilled Therapeutic Interventions/Progress Updates:    Skilled OT intervention with focus on bed mobility, dressing, toileting, functional amb with RW, simulated shower transfers, kitchen safety, home safety, and activity tolerance to increase independence with BADLs. Dressing with mod I. Bed mobility with mod I. All furniture transfers with mod I. Kitchen tasks with supervision. Recommended use of seat to rest when preparing meals, etc with husband. Shower transfer with supervision. Pt has grab bars in shower. Pt fatigues quickley. Reviewed energy conservation strategies. Husband waiting for pt on return. Reviewed pt's current functional status and recommendations. Pt and husband verbalized understanding. Pt pleased with progress and looking forwared to return home. Therapy Documentation Precautions:  Precautions Precautions: Fall Restrictions Weight Bearing Restrictions Per Provider Order: No   Pain: Pt c/o 2/10 generalized pain; meds admin prior to therapy  Therapy/Group: Individual Therapy  [name] 12/26/2023, 12:34 PM

## 2023-12-26 NOTE — IPOC Note (Signed)
 Overall Plan of Care [facility]) Patient Details Name: [name] MRN: [id number] DOB: 1945/07/28  Admitting Diagnosis: Debility  Hospital Problems: Principal Problem:   Debility Active Problems:   Encephalopathy, metabolic     Functional Problem List: Nursing Bladder, Bowel, Edema, Endurance, Medication Management, Pain, Safety  PT Balance, Skin Integrity, Endurance, Safety, Motor, Sensory  OT Balance, Endurance, Motor, Safety  SLP    TR         Basic ADL's: OT Bathing, Dressing, Toileting     Advanced  ADL's: OT Simple Meal Preparation     Transfers: PT Bed Mobility, Bed to Chair, Customer Service Manager, Tub/Shower     Locomotion: PT Ambulation, Psychologist, [employer], Stairs     Additional Impairments: OT None  SLP        TR      Anticipated Outcomes Item Anticipated Outcome  Self Feeding no goal  Swallowing      Basic self-care  mod I  Toileting  mod I   Bathroom Transfers mod I  Bowel/Bladder  manage bowels with medications/ mange bladder with toileting assistance  Transfers  mod I with LRAD  Locomotion  supervision long distance gait  Communication     Cognition     Pain  <4 w prns  Safety/Judgment  manage safety with supervision assistance   Therapy Plan: PT Intensity: Minimum of 1-2 x/day ,45 to 90 minutes PT Frequency: 5 out of 7 days PT Duration Estimated Length of Stay: 5-7 days OT Intensity: Minimum of 1-2 x/day, 45 to 90 minutes OT Frequency: 5 out of 7 days OT Duration/Estimated Length of Stay: 5-7 days     Team Interventions: Nursing Interventions Patient/Family Education, Bladder Management, Bowel Management, Disease Management/Prevention, Pain Management, Medication Management, Discharge Planning  PT interventions Ambulation/gait training, Cognitive remediation/compensation, Discharge planning, DME/adaptive equipment instruction, Functional mobility training, Pain management, Psychosocial support, Therapeutic Activities,  Splinting/orthotics, UE/LE Strength taining/ROM, Visual/perceptual remediation/compensation, Wheelchair propulsion/positioning, UE/LE Coordination activities, Therapeutic Exercise, Stair training, Skin care/wound management, Patient/family education, Neuromuscular re-education, Functional electrical stimulation, Disease management/prevention, Firefighter, Warden/ranger  OT Interventions Warden/ranger, Fish Farm Manager, Patient/family education, Therapeutic Activities, Psychosocial support, Therapeutic Exercise, Functional mobility training, Self Care/advanced ADL retraining, UE/LE Strength taining/ROM, UE/LE Coordination activities, Discharge planning, Disease mangement/prevention  SLP Interventions    TR Interventions    SW/CM Interventions Discharge Planning, Psychosocial Support, Patient/Family Education   Barriers to Discharge MD  Medical stability, Home enviroment access/loayout, Incontinence, and Lack of/limited family support  Nursing Decreased caregiver support, Home environment access/layout Discharge: House  Discharge Home Layout: One level  Discharge Home Access: Stairs to enter  Entrance Stairs-Rails: Right  Entrance Stairs-Number of Steps: 2  PT Decreased caregiver support, Home environment access/layout    OT None    SLP      SW Decreased caregiver support, Lack of/limited family support     Team Discharge Planning: Destination: PT-Home ,OT- Home , SLP-Home Projected Follow-up: PT-Outpatient PT, OT-  Outpatient OT, SLP-None Projected Equipment Needs: PT-To be determined, OT- To be determined, SLP-None recommended by SLP Equipment Details: PT- , OT-  Patient/family involved in discharge planning: PT- Patient,  OT-Patient, Family member/caregiver, SLP-Patient  MD ELOS: 5 days or so Medical Rehab Prognosis:  Excellent Assessment: The patient has been admitted for CIR therapies with the diagnosis of  Encephalopathy/debility. The team will be addressing functional mobility, strength, stamina, balance, safety, adaptive techniques and equipment, self-care, bowel and bladder mgt, patient and caregiver education, . Goals have been set at [facility]  I. Anticipated discharge destination is home .        See Team Conference Notes for weekly updates to the plan of care

## 2023-12-26 NOTE — Progress Notes (Signed)
 Physical Therapy Discharge Summary  Patient Details  Name: [name] MRN: [id number] Date of Birth: [birth year]  Date of Discharge from PT service:December 26, 2023  Today's Date: 12/27/2023       Patient has met 6 of 6 long term goals due to improved activity tolerance, improved balance, improved postural control, and increased strength.  Patient to discharge at an ambulatory level Modified Independent.   Patient's care partner is independent to provide the necessary physical assistance at discharge.  Reasons goals not met: NA  Recommendation:  Patient will benefit from ongoing skilled PT services in outpatient setting to continue to advance safe functional mobility, address ongoing impairments in strength, balance, endurance, and minimize fall risk.  Equipment: Rollator  Reasons for discharge: treatment goals met and discharge from hospital  Patient/family agrees with progress made and goals achieved: Yes  PT Discharge Precautions/Restrictions Precautions Precautions: Fall Precaution/Restrictions Comments: monitor HR Restrictions Weight Bearing Restrictions Per Provider Order: No Pain Interference Pain Interference Pain Effect on Sleep: 1. Rarely or not at all Pain Interference with Therapy Activities: 1. Rarely or not at all Pain Interference with Day-to-Day Activities: 1. Rarely or not at all Vision/Perception  Vision - History Ability to See in Adequate Light: 0 Adequate Perception Perception: Within Functional Limits Praxis Praxis: WFL  Cognition Overall Cognitive Status: Within Functional Limits for tasks assessed Arousal/Alertness: Awake/alert Orientation Level: Oriented X4 Sensation Sensation Light Touch: Appears Intact Hot/Cold: Appears Intact Proprioception: Appears Intact Stereognosis: Not tested Coordination Gross Motor Movements are Fluid and Coordinated: No Fine Motor Movements are Fluid and Coordinated: No Coordination and Movement  Description: Tremor in UE, L>R. Generalized weakness Motor  Motor Motor: Other (comment) Motor - Skilled Clinical Observations: generalized weakness  Mobility Bed Mobility Bed Mobility: Sit to Supine;Supine to Sit Supine to Sit: Independent with assistive device Sit to Supine: Independent with assistive device Transfers Transfers: Stand Pivot Transfers;Sit to Stand Sit to Stand: Independent with assistive device Stand to Sit: Independent with assistive device Stand Pivot Transfers: Independent with assistive device Transfer (Assistive device): Rolling walker Locomotion  Pick up small object from the floor assist level: Supervision/Verbal cueing  Trunk/Postural Assessment  Cervical Assessment Cervical Assessment: Within Functional Limits Thoracic Assessment Thoracic Assessment: Within Functional Limits Lumbar Assessment Lumbar Assessment: Within Functional Limits Postural Control Postural Control: Within Functional Limits  Balance Balance Balance Assessed: Yes Standardized Balance Assessment Standardized Balance Assessment: Timed Up and Go Test Timed Up and Go Test TUG: Normal TUG Normal TUG (seconds): 17.52 Static Sitting Balance Static Sitting - Balance Support: Feet supported Static Sitting - Level of Assistance: 7: Independent Dynamic Sitting Balance Dynamic Sitting - Balance Support: During functional activity Dynamic Sitting - Level of Assistance: 6: Modified independent (Device/Increase time) Static Standing Balance Static Standing - Balance Support: Bilateral upper extremity supported Static Standing - Level of Assistance: 6: Modified independent (Device/Increase time) Dynamic Standing Balance Dynamic Standing - Balance Support: Bilateral upper extremity supported Dynamic Standing - Level of Assistance: 6: Modified independent (Device/Increase time) Extremity Assessment      RLE Assessment RLE Assessment: Exceptions to [facility] General Strength Comments: 4/5  grossly LLE Assessment LLE Assessment: Exceptions to [facility] General Strength Comments: 4-/5 grossly   Kalyna Paolella C Korinna Tat 12/27/2023, 5:08 PM

## 2023-12-26 NOTE — Progress Notes (Signed)
 Physical Therapy Session Note  Patient Details  Name: [name] MRN: [id number] Date of Birth: 02-Apr-1945  Today's Date: 12/26/2023 PT Individual Time: 1050-1200 PT Individual Time Calculation (min): 70 min   Short Term Goals: Week 1:  PT Short Term Goal 1 (Week 1): =LTGs d/t ELOS  Skilled Therapeutic Interventions/Progress Updates:    Pt presents in room in [facility], agreeable to PT. Pt reports soreness in L shoulder, unrated. Session focused on therapeutic activities for DC planning with pt husband present for family education. Pt completes transfers including car transfer with supervision with RW/rollator throughout session secondary to impulsivity and cueing for brake management with rollator. Pt ambulates 2x100', 150', and 200' with supervision with rollator throughout session. Pt completes up/down 8 steps with RHR with CGA/close supervision demonstrating good technique and stability throughout. Pt completes TUG in 17.52 seconds indicating increased risk for falls. Pt completes one set of the following exercises and provided with handout to complete as HEP at DC including:  Access Code: [email or web address] URL: [email or web address] Date: 12/26/2023 Prepared by: Reche Ohara  Exercises - Standing March with Counter Support  - 1 x daily - 7 x weekly - 3 sets - 20 reps - Mini Squat with Counter Support  - 1 x daily - 7 x weekly - 3 sets - 10 reps - Heel Raises with Counter Support  - 1 x daily - 7 x weekly - 3 sets - 20 reps - Standing Hamstring Curl with RW  - 1 x daily - 7 x weekly - 3 sets - 20 reps  Pt husband present throughout session, provided with education on supervision and cueing for safety with pt demonstrating throughout session. Pt returns to room and returns to supine in bed where she remains semi reclined with all needs within reach, call light in place, and bed alarm activated at end of session.   Therapy Documentation Precautions:   Precautions Precautions: Fall Restrictions Weight Bearing Restrictions Per Provider Order: No    Therapy/Group: Individual Therapy  Reche Ohara PT, DPT 12/26/2023, 4:58 PM

## 2023-12-26 NOTE — Consult Note (Signed)
 Neuropsychological Consultation Comprehensive Inpatient Rehab   Patient:   [name]   DOB:   [birth year]  MR Number:  [id number]  Location:  [facility] [address] CENTER [address] [place] [address] Dept: [phone] Loc: [phone]           Date of Service:   12/26/2023  Start Time:   1 PM End Time:   2 PM  Provider/Observer:  Norleen Asa, Psy.D.       Clinical Neuropsychologist       Billing Code/Service: [phone]  Reason for Service:    [name] is a 78 year old female (right-handed) referred for neuropsychological consultation after admission to CIR for impaired mobility and activities of daily living following recent presentation with falls and slurred speech. Assessment requested to evaluate cognitive status and provide psychoeducation following suspected punctate stroke vs seizure.   Presenting Concerns: Reports increased fatigue and shakiness, particularly in the right hand when tired. Expresses concerns about speech difficulties, described as words getting jumbled, especially with multisyllabic words. Notes this is exacerbated by fatigue, nervousness, or focusing on the speech itself. Reports increased urinary frequency.   Relevant Clinical History: Neurological: Hospitalized in September with altered mental status, suspected to be a seizure. No prior history of seizures, blackouts, or strange feelings. Patient regular user of THC products.  Presented to [facility] on 12/16/2023 with falls and slurred speech. MRI showed possible punctate stroke. MRA negative for large vessel occlusion. LTM EEGs on 12/18/2023 and 12/19/2023 were negative for seizures. No reported changes in sensation. Psychiatric: Prior history of Anxiety and likely self medicating. Medical: PMHx of HTN, HLD, osteopenia, and CKD. Reports extensive bruising on arms, attributed to blood thinning agents and IV sites.    Functional Capacity: Reported to be out of commission for eight days. Currently participating in physical therapy to regain strength. Able to ambulate. Reports being an active tennis player prior to this event.   Mental State Examination: Appearance and Behaviour: Appeared well-groomed. Cooperative with interview. Reports increased fatigability. No motor restlessness or agitation observed. Face appears symmetrical with no evident drooping. Noted to have extensive bruising on both arms. Speech: Subjective report of word-finding difficulties and jumbled speech, particularly with multisyllabic words, when fatigued or nervous. During the interview, speech was mostly fluent and coherent, but some mild dysarthria was noted for sepcific words mild in nature and patient reports that this has in fact been improving. Rate and volume were within normal limits. Mood and Affect: Mood described as basically good. Affect was appropriate to the content of the discussion, stable, and full-range. Thought Process/Content: Thought process was logical and coherent. No evidence of flight of ideas, disorganization, or psychotic content. Cognition: Oriented. Attention appeared adequate for conversation. Reports feeling foggy and having difficulty concentrating in the days immediately following the event. Able to recall recent and remote events. Insight and Judgement: Demonstrates good insight into speech deficits and contributing factors (fatigue, stress). Judgement appears intact.   Clinical Impressions: [name] is a 78 year old right-handed woman admitted following a suspected small stroke. Primary cognitive complaint is a mild dysarthria, which has shown significant improvement over the past week. She is engaged and motivated for recovery. The clinical presentation is consistent with a small cerebrovascular event vs seizure with post ictal patterns, likely intermediate between a transient ischemic attack  and a larger stroke, affecting pathways related to speech production (e.g., Broca's area or connecting white matter tracts). The lack of significant  motor or sensory deficits on the right side suggests a very small, localized area of involvement. Prognosis for continued improvement in speech is good, given the recovery trajectory to date.   Recommendations and Next Steps: Continue with planned medication adjustments for secondary stroke prevention, to be managed by the primary team (neurology/cardiology). Psychoeducation provided regarding the nature of the suspected stroke, the brain's healing process, and prognosis for recovery. Advised to not avoid speaking and to remain physically active within safe limits, balancing activity with rest to manage fatigue. Advised that developing compensatory strategies (e.g., slowing down, concentrating) is a useful tool for managing residual deficits. Advised to stop using any substance such as THC that could brain blood flow patterns. Discharge planning to be completed by the PA, Pam, including outpatient therapy recommendations and medication review. Family education to be provided as needed prior to discharge. Continued follow-up with primary care physician, Dr. Nadean. No further neuropsychological assessment indicated at this time. Will be available for consultation if new concerns arise.   Diagnosis:    Generalized Anxiety         Electronically Signed   _______________________ Norleen Asa, Psy.D. Clinical Neuropsychologist

## 2023-12-26 NOTE — Progress Notes (Signed)
 Patient had several episodes of urinary incontinence this shift. Patient denies burning sensation when voiding. Patient was prompted to toilet this AM and was able to use the bathroom without incontinence.

## 2023-12-26 NOTE — Progress Notes (Addendum)
 Patient ID: [name], female   DOB: 1945/03/09, 78 y.o.   MRN: [id number]  Therapy recommends a rollator for pt.  SW ordered rollator with [employer] via parachute.   SW met with pt in room to discuss above; rollator already delivered.   Graeme [name], MSW, LCSW Office: [phone] Cell: [phone] Fax: [phone]

## 2023-12-26 NOTE — Progress Notes (Signed)
 Physical Therapy Session Note  Patient Details  Name: [name] MRN: [id number] Date of Birth: 1946/01/12  Today's Date: 12/26/2023 PT Individual Time: 8581-8471 PT Individual Time Calculation (min): 70 min   Short Term Goals: Week 1:  PT Short Term Goal 1 (Week 1): =LTGs d/t ELOS  Skilled Therapeutic Interventions/Progress Updates:     Pt received supine in bed and agrees to therapy. No complaint of pain. Supine to sit with bed features. Pt performs sit to stand with rollator and cues for safety with brake management. Pt ambulates multiple bouts during session to work on endurance and mobility training. Pt ambulates x175' prior to rest break on rollator, then x200' and additional extended seated rest break in hard backed chair.   Pt tasked with endurance training via circuit activity, ambulating bouts of x175' with rest breaks between each bout. Pt given rest break for 6:00 between each bout. Pt completes x3 bouts.  Pt then completes Nustep for endurance training, completing x10:00 at workload of 4 with average steps per minute ~45. PT provides cues for hand and foot placement and completing full available ROM.   Pt ambulates back to room with rollator and same assistance and cues, taking several seated rest breaks. Following, pt left seated with all needs within reach .  Therapy Documentation Precautions:  Precautions Precautions: Fall Restrictions Weight Bearing Restrictions Per Provider Order: No   Therapy/Group: Individual Therapy  Elsie JAYSON Dawn, PT, DPT 12/26/2023, 3:47 PM

## 2023-12-26 NOTE — Plan of Care (Signed)
  Problem: Consults Goal: Skin Care Protocol Initiated - if Braden Score 18 or less Description: If consults are not indicated, leave blank or document N/A Outcome: Progressing   Problem: RH BOWEL ELIMINATION Goal: RH STG MANAGE BOWEL WITH ASSISTANCE Description: STG Manage Bowel with supervision Assistance. Outcome: Progressing   Problem: RH SKIN INTEGRITY Goal: RH STG SKIN FREE OF INFECTION/BREAKDOWN Description: Manage skin free of infection with supervision assistance Outcome: Progressing Goal: RH STG MAINTAIN SKIN INTEGRITY WITH ASSISTANCE Description: STG Maintain Skin Integrity With Assistance. Outcome: Progressing   Problem: RH SAFETY Goal: RH STG ADHERE TO SAFETY PRECAUTIONS W/ASSISTANCE/DEVICE Description: STG Adhere to Safety Precautions With supervision Assistance/Device. Outcome: Progressing   Problem: RH PAIN MANAGEMENT Goal: RH STG PAIN MANAGED AT OR BELOW PT'S PAIN GOAL Description: <4 w prns Outcome: Progressing   Problem: RH KNOWLEDGE DEFICIT GENERAL Goal: RH STG INCREASE KNOWLEDGE OF SELF CARE AFTER HOSPITALIZATION Description: Manage increase knowledge of self care after hospitalization with supervision assistance from husband using educational materials provided Outcome: Progressing   Problem: Consults Goal: RH GENERAL PATIENT EDUCATION Description: See Patient Education module for education specifics. Outcome: Progressing   Problem: RH BLADDER ELIMINATION Goal: RH STG MANAGE BLADDER WITH ASSISTANCE Description: STG Manage Bladder With supervision Assistance Outcome: Progressing   Problem: RH SKIN INTEGRITY Goal: RH STG SKIN FREE OF INFECTION/BREAKDOWN Outcome: Progressing   Problem: RH SAFETY Goal: RH STG ADHERE TO SAFETY PRECAUTIONS W/ASSISTANCE/DEVICE Description: STG Adhere to Safety Precautions With supervision  Assistance/Device. Outcome: Progressing   Problem: RH PAIN MANAGEMENT Goal: RH STG PAIN MANAGED AT OR BELOW PT'S PAIN  GOAL Outcome: Progressing   Problem: RH KNOWLEDGE DEFICIT GENERAL Goal: RH STG INCREASE KNOWLEDGE OF SELF CARE AFTER HOSPITALIZATION Description: Manage increase knowledge of self care after hospitalization with supervision assistance from husband using educatonal materials provided Outcome: Progressing

## 2023-12-27 ENCOUNTER — Other Ambulatory Visit (HOSPITAL_COMMUNITY): Payer: Self-pay

## 2023-12-27 DIAGNOSIS — R5381 Other malaise: Secondary | ICD-10-CM | POA: Diagnosis not present

## 2023-12-27 LAB — BASIC METABOLIC PANEL WITH GFR
Anion gap: 12 (ref 5–15)
BUN: 21 mg/dL (ref 8–23)
CO2: 22 mmol/L (ref 22–32)
Calcium: 8.7 mg/dL — ABNORMAL LOW (ref 8.9–10.3)
Chloride: 106 mmol/L (ref 98–111)
Creatinine, Ser: 1.08 mg/dL — ABNORMAL HIGH (ref 0.44–1.00)
GFR, Estimated: 52 mL/min — ABNORMAL LOW (ref >60)
Glucose, Bld: 100 mg/dL — ABNORMAL HIGH (ref 70–99)
Potassium: 4.4 mmol/L (ref 3.5–5.1)
Sodium: 140 mmol/L (ref 135–145)

## 2023-12-27 LAB — CBC WITH DIFFERENTIAL/PLATELET
Abs Immature Granulocytes: 0.05 K/uL (ref 0.00–0.07)
Basophils Absolute: 0.1 K/uL (ref 0.0–0.1)
Basophils Relative: 1 %
Eosinophils Absolute: 0.2 K/uL (ref 0.0–0.5)
Eosinophils Relative: 2 %
HCT: 32.1 % — ABNORMAL LOW (ref 36.0–46.0)
Hemoglobin: 10.5 g/dL — ABNORMAL LOW (ref 12.0–15.0)
Immature Granulocytes: 1 %
Lymphocytes Relative: 25 %
Lymphs Abs: 2.2 K/uL (ref 0.7–4.0)
MCH: 27.9 pg (ref 26.0–34.0)
MCHC: 32.7 g/dL (ref 30.0–36.0)
MCV: 85.4 fL (ref 80.0–100.0)
Monocytes Absolute: 0.6 K/uL (ref 0.1–1.0)
Monocytes Relative: 7 %
Neutro Abs: 5.5 K/uL (ref 1.7–7.7)
Neutrophils Relative %: 64 %
Platelets: 367 K/uL (ref 150–400)
RBC: 3.76 MIL/uL — ABNORMAL LOW (ref 3.87–5.11)
RDW: 17.6 % — ABNORMAL HIGH (ref 11.5–15.5)
WBC: 8.6 K/uL (ref 4.0–10.5)
nRBC: 0 % (ref 0.0–0.2)

## 2023-12-27 LAB — GLUCOSE, CAPILLARY: Glucose-Capillary: 122 mg/dL — ABNORMAL HIGH (ref 70–99)

## 2023-12-27 NOTE — Progress Notes (Signed)
 Inpatient Rehabilitation Discharge Medication Review by a Pharmacist  A complete drug regimen review was completed for this patient to identify any potential clinically significant medication issues.  High Risk Drug Classes Is patient taking? Indication by Medication  Antipsychotic No   Anticoagulant No   Antibiotic No   Opioid No   Antiplatelet Yes bASA- history of CVA  Hypoglycemics/insulin  No   Vasoactive Medication Yes Lotrel  - HTN  Chemotherapy No   Other Yes Venlafazine - mood Atorvastatin  - HLD Prilosec - GERD APAP,robaxin - pain Vimpat  - seizures KCL, MVI - supplement Raloxifen - Osteopenia Effexor  - mood     Type of Medication Issue Identified Description of Issue Recommendation(s)  Drug Interaction(s) (clinically significant)     Duplicate Therapy     Allergy     No Medication Administration End Date     Incorrect Dose     Additional Drug Therapy Needed     Significant med changes from prior encounter (inform family/care partners about these prior to discharge). Medications stopped: Vit B12, Flexeril , Quetiapine , Dyazide , Zolpidem    Other       Clinically significant medication issues were identified that warrant physician communication and completion of prescribed/recommended actions by midnight of the next day:  No  Name of provider notified for urgent issues identified:   Provider Method of Notification:     Pharmacist comments:   Time spent performing this drug regimen review (minutes):  15   Gregorio Cara, PharmD Clinical Pharmacist 12/27/2023 7:48 AM *Pharmacist phone directory can now be found on [email or web address] ([email or web address]).  Listed under [facility].

## 2023-12-27 NOTE — Discharge Summary (Signed)
 Physician Discharge Summary  Patient ID: [name] MRN: [id number] DOB/AGE: 1945/05/21 78 y.o.  Admit date: 12/23/2023 Discharge date: 12/27/2023  Discharge Diagnoses:  Principal Problem:   Debility Active Problems:   Primary insomnia   MDD (recurrent major depressive disorder) in remission   Seizure-like activity (HCC)   Encephalopathy, metabolic   Anxiety state   High blood pressure   Hypokalemia   Discharged Condition: stable  Significant Diagnostic Studies: N/A   Labs:  Basic Metabolic Panel: Recent Labs  Lab 12/21/23 0117 12/22/23 0239 12/25/23 0449 12/27/23 0513  NA 140 141 140 140  K 3.4* 3.3* 3.3* 4.4  CL 104 106 107 106  CO2 22 20* 23 22  GLUCOSE 93 98 103* 100*  BUN 21 17 16 21   CREATININE 1.05* 0.93 1.09* 1.08*  CALCIUM  8.3* 8.7* 8.8* 8.7*  MG 1.5* 1.8  --   --   PHOS 2.7 3.1  --   --     CBC: Recent Labs  Lab 12/22/23 0239 12/25/23 0449 12/27/23 0513  WBC 9.7 8.8 8.6  NEUTROABS  --  5.4 5.5  HGB 10.8* 10.3* 10.5*  HCT 32.6* 31.4* 32.1*  MCV 83.8 83.7 85.4  PLT 440* 389 367    CBG: Recent Labs  Lab 12/24/23 0622 12/25/23 0605 12/25/23 1133 12/26/23 0548 12/27/23 0727  GLUCAP 104* 110* 125* 98 122*    Brief HPI:   [name] is a 79 y.o. female with history of HTN, osteopenia, CKD, GAD, AMS sept felt concerning for seizure and placed on Lacosamide . She was admitted on 12/16/23 with reports of SOB, speech difficulty X few days as well as balance deficits. Anion gap metabolic acidosis  noted and she was treated with cautious hydration. MRI/MRA brain done which was negative for LVO and showed punctate focus of restricted diffusion question punctate infarct v/s changes of transient global amnesia. Vimpat  level noted to be subtherapeutic and dose increased to 100 mg BID.  LT-EEG without evidence of seizure activity. UDS positive for THC and patient and family advised to cease use of all cannabis products.  Intermittent  hallucinations and delusions that has resolved.   MRI brain repeated 11/05 showing no change in punctate diffusion abnormality. Dr. Merrianne questioned possible underlying incipient dementia make her susceptible to delirium secondary to relatively mild insults and to continue Vimpat  at 100 mg bid with gradual taper over 4 weeks to be managed by outpatient neurologist. B12 level boderline low with recommendations of weekly Cyanocobalamin  X 4 followed by daily oral supplement. PT/OT was consulted and patient was noted to be requiring min assist with ADLs and CGA with mobility. CIR recommended due to recent decline.    Hospital Course: [name] was admitted to rehab 12/23/2023 for inpatient therapies to consist of PT and OT at least three hours five days a week. Past admission physiatrist, therapy team and rehab RN have worked together to provide customized collaborative inpatient rehab. Her blood pressures were monitored on TID basis and has been stable on home dose Lotrel . Dysarthria has resolved and ST evaluation revealed some hesitations and word finding difficulty with instances of phonemic paraphasias that she was able to self correct and no follow up ST was recommended during her stay. Repeat check of BMET showed recurrent hypokalemia with K down to 3.3 which was supplemented with 40 meq and she is to continue on 10 meq for 2 weeks with repeat labs at PCP for recheck and need for further supplementation.  Cognition is intact and mood has been stable on Venlafaxine . She reported that trazodone  100 mg was ineffective and wanted to try Restoril  for sleep. She was advised that this would be short term use in hospital and that she would need to follow up with PCP for refills. She has been educated on good sleep hygiene and changing her habits for chronic issues with sleep wake disruption. Dr. Rodenbough/neuropsychologist has educated patient on developing compensatory strategies and balancing  activity with rest as well as importance of cessation of THC products that could affect brain flow patterns. She has made gains during her stay and supervision is recommended for safety. She will continue to receive follow up outpatient PT and OT at [facility] after discharge.   . Rehab course: During patient's stay in rehab weekly team conferences were held to monitor patient's progress, set goals and discuss barriers to discharge. At admission, patient required CGA with ADL tasks and min assist with mobility. She  has had improvement in activity tolerance, balance, postural control as well as ability to compensate for deficits. She is able to complete ADL tasks at modified independent level and requires supervision for shower transfers. She requires   Disposition: Home w/outpatient therapy  Diet: Regular.   Special Instructions: Discontinue use of all Cannabis/THC products to avoid encephalopathy, stroke and seizure risk.  Family needs to assist with medication/supplement management.  3.   Recommend weekly cyanocobalamin  injections 1000 mc X 4 followed by daily oral supplement.  4. Recommend repeat check BMET to monitor potassium level.    Discharge Instructions     Ambulatory referral to Neurology   Complete by: As directed    An appointment is requested in approximately: 2 weeks/follow up and medication taper   Ambulatory referral to Occupational Therapy   Complete by: As directed    Eval and treat   Ambulatory referral to Physical Therapy   Complete by: As directed    Eval and treat      Allergies as of 12/27/2023       Reactions   Penicillins Hives   Tolerates Cephalosporins   Prednisone  Other (See Comments)   Hyperactivity Sleep disturbance Confusion   Sulfa Antibiotics Hives           Medication List     STOP taking these medications    cyanocobalamin  1000 MCG tablet   cyclobenzaprine  5 MG tablet Commonly known as: FLEXERIL     QUEtiapine  25 MG tablet Commonly known as: SEROQUEL    triamterene -hydrochlorothiazide  37.5-25 MG capsule Commonly known as: DYAZIDE    zolpidem  5 MG tablet Commonly known as: AMBIEN        TAKE these medications    acetaminophen  500 MG tablet Commonly known as: TYLENOL  Take 500 mg by mouth every 6 (six) hours as needed for moderate pain (pain score 4-6).   amLODipine -benazepril  5-20 MG capsule Commonly known as: LOTREL  Take 1 capsule by mouth daily.   aspirin EC 81 MG tablet Take 1 tablet (81 mg total) by mouth daily. Swallow whole.   atorvastatin  20 MG tablet Commonly known as: LIPITOR Take 1 tablet (20 mg total) by mouth daily.   Lacosamide  100 MG Tabs Take 1 tablet (100 mg total) by mouth 2 (two) times daily. What changed:  medication strength how much to take   methocarbamol 500 MG tablet Commonly known as: ROBAXIN Take 1 tablet (500 mg total) by mouth every 8 (eight) hours as needed for muscle spasms.   Multivitamin Women 50+ Tabs Take  1 tablet by mouth daily.   omeprazole  40 MG capsule Commonly known as: PRILOSEC Take 1 capsule (40 mg total) by mouth daily.   OVER THE COUNTER MEDICATION Take 1 capsule by mouth every 12 (twelve) hours as needed (headache, cold/flu-like symptoms). OTC cold capsules   Polyethyl Glycol-Propyl Glycol 0.4-0.3 % Soln Place 1 drop into both eyes at bedtime.   potassium chloride  10 MEQ tablet Commonly known as: KLOR-CON  M Take 1 tablet (10 mEq total) by mouth daily.   raloxifene  60 MG tablet Commonly known as: EVISTA  Take 1 tablet (60 mg total) by mouth daily.   venlafaxine  XR 150 MG 24 hr capsule Commonly known as: EFFEXOR -XR Take 1 capsule (150 mg total) by mouth daily with breakfast.        Follow-up Information     Wendolyn Jenkins Jansky, MD Follow up.   Specialty: Family Medicine Why: Call in 1-2 days for post hospital follow up Contact information: [address] [place] [address] [phone]          Cornelio Bouchard, MD. Call.   Specialty: Physical Medicine and Rehabilitation Why: As needed Contact information: [address] [place] [address] [phone]         Gregg Lek, MD Follow up.   Specialty: Neurology Why: Call in 1-2 days for post hospital follow up and for taper of Vimpat  Contact information: [address] [place] [address] [phone]                 Signed: Sharlet GORMAN Schmitz 12/27/2023, 3:52 PM

## 2023-12-27 NOTE — Progress Notes (Signed)
 PROGRESS NOTE   Subjective/Complaints:  Pt reports doing well.   Ready for d/c- wants to go home!    ROS:  Per HPI   Pt denies SOB, abd pain, CP, N/V/C/D, and vision changes    Objective:   No results found. Recent Labs    12/25/23 0449 12/27/23 0513  WBC 8.8 8.6  HGB 10.3* 10.5*  HCT 31.4* 32.1*  PLT 389 367   Recent Labs    12/25/23 0449 12/27/23 0513  NA 140 140  K 3.3* 4.4  CL 107 106  CO2 23 22  GLUCOSE 103* 100*  BUN 16 21  CREATININE 1.09* 1.08*  CALCIUM  8.8* 8.7*    Intake/Output Summary (Last 24 hours) at 12/27/2023 0845 Last data filed at 12/27/2023 0800 Gross per 24 hour  Intake 418 ml  Output --  Net 418 ml        Physical Exam: Vital Signs Blood pressure (!) 154/73, pulse 86, temperature 98.3 F (36.8 C), resp. rate 19, height 5' 2 (1.575 m), weight 81.4 kg, SpO2 94%.       General: awake, alert, appropriate, sitting up in bed; NAD HENT: conjugate gaze; oropharynx moist CV: regular rate; no JVD Pulmonary: CTA B/L; no W/R/R- good air movement GI: soft, NT, ND, (+)BS Psychiatric: appropriate Neurological: Ox3     Assessment/Plan: 1. Functional deficits which require 3+ hours per day of interdisciplinary therapy in a comprehensive inpatient rehab setting. Physiatrist is providing close team supervision and 24 hour management of active medical problems listed below. Physiatrist and rehab team continue to assess barriers to discharge/monitor patient progress toward functional and medical goals  Care Tool:  Bathing    Body parts bathed by patient: Right arm, Left arm, Chest, Abdomen, Buttocks, Front perineal area, Right upper leg, Left upper leg, Right lower leg, Left lower leg, Face         Bathing assist Assist Level: Independent with assistive device     Upper Body Dressing/Undressing Upper body dressing   What is the patient wearing?: Pull over shirt     Upper body assist Assist Level: Independent    Lower Body Dressing/Undressing Lower body dressing      What is the patient wearing?: Pants, Underwear/pull up     Lower body assist Assist for lower body dressing: Independent with assitive device     Toileting Toileting    Toileting assist Assist for toileting: Independent with assistive device     Transfers Chair/bed transfer  Transfers assist     Chair/bed transfer assist level: Supervision/Verbal cueing     Locomotion Ambulation   Ambulation assist      Assist level: Supervision/Verbal cueing Assistive device: Rollator Max distance: 200'   Walk 10 feet activity   Assist     Assist level: Supervision/Verbal cueing Assistive device: Rollator   Walk 50 feet activity   Assist    Assist level: Supervision/Verbal cueing Assistive device: Rollator    Walk 150 feet activity   Assist Walk 150 feet activity did not occur: Safety/medical concerns (fatigue, SOB)  Assist level: Supervision/Verbal cueing Assistive device: Rollator    Walk 10 feet on uneven surface  activity   Assist  Assist level: Supervision/Verbal cueing Assistive device: Rollator   Wheelchair     Assist Is the patient using a wheelchair?: No (ambulating on unit) Type of Wheelchair: Manual    Wheelchair assist level: Supervision/Verbal cueing Max wheelchair distance: 150    Wheelchair 50 feet with 2 turns activity    Assist        Assist Level: Supervision/Verbal cueing   Wheelchair 150 feet activity     Assist      Assist Level: Supervision/Verbal cueing   Blood pressure (!) 154/73, pulse 86, temperature 98.3 F (36.8 C), resp. rate 19, height 5' 2 (1.575 m), weight 81.4 kg, SpO2 94%.  Medical Problem List and Plan: 1. Debility due to encephalopathy/questionable seizures/dehydration             -patient may shower             -ELOS/Goals: 10-14 days S             Admit to CIR   D/c  Thursday 11/13  No UTI  D/c today- doesn't need rehab f/u.  2.  Antithrombotics: -DVT/anticoagulation:  Heparin             -antiplatelet therapy: aspirin 81 mg daily   11/10- change Heparin to Lovenox - renal levels back to baseline  11/12- will not need to go home on meds 3. Pain: continue tylenol  q6H scheduled   4. Insomnia: continue trazodone  100mg  HS   11/10- pt on Trazodone  100 mg at bedtime- pt didn't remember when offered to start her on Trazodone - scheduled- will see how does 2nd night after therapies today.   11/11- changed to Restoril  15 mg at bedtime- will need to get from PCP if needs after d/c  11/12- changed to 7.5 mg at bedtime we discussed that could ask for it prn at home from PCP 5. Neuropsych/cognition: This patient is  capable of making decisions on her own behalf.   6. Seizure like activity: EEG was negative, Neurology has recommended Vimpat  taper and outpatient neurology follow-up to manage taper   - do NOT taper Vimpat - will need Neuro f/u.  7. Generalized anxiety disorder and depression: continue Venlafaxine .   8. Dysarthria: appears to have greatly improved, does not appear to require SLP    9.Encephalopathy- : also appears to have greatly improved, will not order SLP at this time   10. Chronic diarrhea: continue outpatient GI follow-up, consider psyllium husk to help bulk stool-  11/10- pt had GI panel- was (-)- LBM 24+ hours ago- unlikely to be C Diff- will d/c Enteric precautions   11/12- 1 x diarrhea last night  11/13- no again yet 11. HTN: Continue Lotensin  and amlodipine    12. Osteopenia: continue Evista    13. Punctate right hippocampal DWI abnormality: as per neurology not concerning for CVA and no need for repeat MRI, continue statin  11/10- pt's initial MRI was concerning for CVA, however f/u was less concerning per Neuro- did have pt do continuous long term EEG- was (-)-   - tapering off Vimpat . Will decrease later this week  14.  Hypokalemia  11/10 - last K+ was 3.3- will recheck in AM    11/11- K+ 3.3- will replete with 40 Meq x1 and 10 mEq daily starting tomorrow-  15. UTI?- urinary incontinence  11/12- will check U/A and Cx- reached out to nursing to let them know.   -pending Results.   11/13- no UTI per U/A    The patient is medically ready for  discharge to home and will not need follow-up with [facility]. In addition, they will need to follow up with their PCP, Neurology for Possible taper of Vimpat .    LOS: 4 days A FACE TO FACE EVALUATION WAS PERFORMED  [name] 12/27/2023, 8:45 AM

## 2023-12-27 NOTE — Plan of Care (Signed)
  Problem: RH SKIN INTEGRITY Goal: RH STG SKIN FREE OF INFECTION/BREAKDOWN Description: Manage skin free of infection with supervision assistance Outcome: Progressing Goal: RH STG MAINTAIN SKIN INTEGRITY WITH ASSISTANCE Description: STG Maintain Skin Integrity With Assistance. Outcome: Progressing

## 2023-12-28 ENCOUNTER — Ambulatory Visit: Payer: Self-pay | Admitting: Family Medicine

## 2023-12-28 LAB — URINE CULTURE: Culture: 20000 — AB

## 2023-12-28 NOTE — Progress Notes (Signed)
 Please call pt.  Was she having bladder symptoms or why did they do the urine test.  I may need to treat it if symptoms

## 2024-01-04 ENCOUNTER — Ambulatory Visit: Payer: Self-pay | Admitting: Family Medicine

## 2024-01-04 ENCOUNTER — Ambulatory Visit: Admitting: Family Medicine

## 2024-01-04 ENCOUNTER — Encounter: Payer: Self-pay | Admitting: Family Medicine

## 2024-01-04 ENCOUNTER — Other Ambulatory Visit (HOSPITAL_COMMUNITY): Payer: Self-pay

## 2024-01-04 VITALS — BP 124/64 | HR 97 | Temp 98.0°F | Ht 62.0 in | Wt 182.6 lb

## 2024-01-04 DIAGNOSIS — E538 Deficiency of other specified B group vitamins: Secondary | ICD-10-CM

## 2024-01-04 DIAGNOSIS — R569 Unspecified convulsions: Secondary | ICD-10-CM | POA: Diagnosis not present

## 2024-01-04 DIAGNOSIS — D649 Anemia, unspecified: Secondary | ICD-10-CM

## 2024-01-04 DIAGNOSIS — Z79899 Other long term (current) drug therapy: Secondary | ICD-10-CM | POA: Diagnosis not present

## 2024-01-04 DIAGNOSIS — F5101 Primary insomnia: Secondary | ICD-10-CM | POA: Diagnosis not present

## 2024-01-04 DIAGNOSIS — E876 Hypokalemia: Secondary | ICD-10-CM | POA: Diagnosis not present

## 2024-01-04 DIAGNOSIS — F3340 Major depressive disorder, recurrent, in remission, unspecified: Secondary | ICD-10-CM | POA: Diagnosis not present

## 2024-01-04 DIAGNOSIS — F411 Generalized anxiety disorder: Secondary | ICD-10-CM | POA: Diagnosis not present

## 2024-01-04 DIAGNOSIS — G9341 Metabolic encephalopathy: Secondary | ICD-10-CM

## 2024-01-04 LAB — CBC WITH DIFFERENTIAL/PLATELET
Basophils Absolute: 0.1 K/uL (ref 0.0–0.1)
Basophils Relative: 1.2 % (ref 0.0–3.0)
Eosinophils Absolute: 0.1 K/uL (ref 0.0–0.7)
Eosinophils Relative: 1.6 % (ref 0.0–5.0)
HCT: 33.9 % — ABNORMAL LOW (ref 36.0–46.0)
Hemoglobin: 11.1 g/dL — ABNORMAL LOW (ref 12.0–15.0)
Lymphocytes Relative: 17.6 % (ref 12.0–46.0)
Lymphs Abs: 1.6 K/uL (ref 0.7–4.0)
MCHC: 32.6 g/dL (ref 30.0–36.0)
MCV: 86 fl (ref 78.0–100.0)
Monocytes Absolute: 0.5 K/uL (ref 0.1–1.0)
Monocytes Relative: 6.2 % (ref 3.0–12.0)
Neutro Abs: 6.5 K/uL (ref 1.4–7.7)
Neutrophils Relative %: 73.4 % (ref 43.0–77.0)
Platelets: 473 K/uL — ABNORMAL HIGH (ref 150.0–400.0)
RBC: 3.94 Mil/uL (ref 3.87–5.11)
RDW: 18.2 % — ABNORMAL HIGH (ref 11.5–15.5)
WBC: 8.8 K/uL (ref 4.0–10.5)

## 2024-01-04 LAB — COMPREHENSIVE METABOLIC PANEL WITH GFR
ALT: 21 U/L (ref 0–35)
AST: 23 U/L (ref 0–37)
Albumin: 4.2 g/dL (ref 3.5–5.2)
Alkaline Phosphatase: 153 U/L — ABNORMAL HIGH (ref 39–117)
BUN: 23 mg/dL (ref 6–23)
CO2: 21 meq/L (ref 19–32)
Calcium: 9 mg/dL (ref 8.4–10.5)
Chloride: 108 meq/L (ref 96–112)
Creatinine, Ser: 0.92 mg/dL (ref 0.40–1.20)
GFR: 59.36 mL/min — ABNORMAL LOW (ref >60.00)
Glucose, Bld: 103 mg/dL — ABNORMAL HIGH (ref 70–99)
Potassium: 3.5 meq/L (ref 3.5–5.1)
Sodium: 139 meq/L (ref 135–145)
Total Bilirubin: 0.3 mg/dL (ref 0.2–1.2)
Total Protein: 7.3 g/dL (ref 6.0–8.3)

## 2024-01-04 LAB — MAGNESIUM: Magnesium: 1.4 mg/dL — ABNORMAL LOW (ref 1.5–2.5)

## 2024-01-04 MED ORDER — BUSPIRONE HCL 5 MG PO TABS
5.0000 mg | ORAL_TABLET | Freq: Two times a day (BID) | ORAL | 1 refills | Status: DC
  Filled 2024-01-04: qty 60, 30d supply, fill #0
  Filled 2024-01-27: qty 60, 30d supply, fill #1

## 2024-01-04 MED ORDER — VENLAFAXINE HCL ER 150 MG PO CP24
150.0000 mg | ORAL_CAPSULE | Freq: Every day | ORAL | 1 refills | Status: DC
  Filled 2024-01-04 – 2024-02-11 (×2): qty 30, 30d supply, fill #0
  Filled 2024-03-16: qty 30, 30d supply, fill #1

## 2024-01-04 MED ORDER — POTASSIUM CHLORIDE CRYS ER 10 MEQ PO TBCR
10.0000 meq | EXTENDED_RELEASE_TABLET | Freq: Every day | ORAL | 0 refills | Status: DC
  Filled 2024-01-04 – 2024-01-12 (×2): qty 30, 30d supply, fill #0

## 2024-01-04 MED ORDER — CYANOCOBALAMIN 1000 MCG/ML IJ SOLN
1000.0000 ug | Freq: Once | INTRAMUSCULAR | Status: AC
  Administered 2024-01-04: 1000 ug via INTRAMUSCULAR

## 2024-01-04 MED ORDER — METHOCARBAMOL 500 MG PO TABS
500.0000 mg | ORAL_TABLET | Freq: Three times a day (TID) | ORAL | 1 refills | Status: DC | PRN
  Filled 2024-01-04: qty 60, 20d supply, fill #0

## 2024-01-04 MED ORDER — ZOLPIDEM TARTRATE 5 MG PO TABS
5.0000 mg | ORAL_TABLET | Freq: Every evening | ORAL | 1 refills | Status: DC | PRN
  Filled 2024-01-04 (×2): qty 30, 30d supply, fill #0
  Filled 2024-01-12: qty 30, 30d supply, fill #1

## 2024-01-04 NOTE — Progress Notes (Signed)
 Anemia not worse Alk phos elevated from fractures Potassium barely normal-stay on it Magnesium  low-take otc magnesium  glycinate 500mg  daily  Repeat potassium and Mg in 1-2 wks-orders in, just needs lab appt

## 2024-01-04 NOTE — Patient Instructions (Addendum)
 [phone]   call GI  Call about the sleep place  Manage her ambien 

## 2024-01-04 NOTE — Progress Notes (Signed)
 Subjective:     Patient ID: [name], female    DOB: October 25, 1945, 78 y.o.   MRN: [id number]  Chief Complaint  Patient presents with   Hospitalization Follow-up    Dizziness Principal problem  12/23/2023 - 12/27/2023 (4 days) [facility]  Debility Principal problem  Pt stated that she had a couple of broken ribs that still bothers her and she is not sleeping   11/2-hosp11/9-13  Discussed the use of AI scribe software for clinical note transcription with the patient, who gave verbal consent to proceed.  History of Present Illness [name] is a 78 year old female who presents for follow-up after hospitalization for confusion, encephalopathy, and falls. She is accompanied by her husband.  She was recently hospitalized due to confusion, encephalopathy, and multiple falls. An MRI initially showed changes poss cva, and a subsequent EEG was performed. She is scheduled to see a neurologist next week for further evaluation. There is some confusion on whether or not she had a stroke  Prior to the hosp, She experienced multiple falls over a weekend, beginning with a gentle tipping over on Friday night, followed by several falls on Saturday, resulting in bruises and two broken ribs. Since returning home from rehab, she has not fallen and is participating in home physical therapy while awaiting outpatient therapy after Thanksgiving.  Her current medications include atorvastatin , aspirin , and a seizure medication, lacosamide , which was initiated during her hospital stay. She also takes methocarbamol  for muscle pain, used two to three times a day, and omeprazole  for her stomach. A diuretic was discontinued during her hospital stay due to low potassium levels.  She experiences insomnia, which has worsened recently, possibly due to anxiety. She has a history of using Ambien  and temazepam  for sleep but is currently out of Ambien . She is seeking alternative solutions  for her insomnia.has had insomnia whole like and then gets anxious and desparate to sleep so has taken extra meds at times. Was seeing psych but not helpful and doesn't want to return  Anxiety-has been on mult meds and nothing seems to control well.  Gets anxious about not sleeping and starts cycling.  No SI.  Taking effexor  long time  She has a history of low vitamin B12 levels, with recent lab results showing a level of 235, which is considered low. She was not taking B12 supplements prior to her hospitalization but started them during her stay. She also experienced low potassium levels during her hospitalization, leading to the discontinuation of her diuretic.  She experiences diarrhea once a day, typically after breakfast, which she attributes to a possible side effect of her medications-has had for many years. No current issues with urination, although she had an episode of incontinence during her hospital stay, which resolved without treatment.  Discharge Diagnoses:  Principal Problem:   Debility Active Problems:   Primary insomnia   MDD (recurrent major depressive disorder) in remission   Seizure-like activity (HCC)   Encephalopathy, metabolic   Anxiety state   High blood pressure   Hypokalemia L 3-4 rib fx Lung nodule  Health Maintenance Due  Topic Date Due   Medicare Annual Wellness (AWV)  08/08/2023    Past Medical History:  Diagnosis Date   Allergy 1980's   Sulfa drugs and penecillin   Anxiety    Cataract 2021   Corrected   Chronic kidney disease 2024   Stage 3   Depression    Headache    SINUS  Hyperlipidemia    Hypertension 2021   Osteopenia    Osteopenia    Primary localized osteoarthritis of right knee    Seizures (HCC) 10/2023    Past Surgical History:  Procedure Laterality Date   BREAST EXCISIONAL BIOPSY Right    benign cyst removal    CARPAL TUNNEL RELEASE Right 2015   Ortho in [place]   CHOLECYSTECTOMY N/A 02/08/2022   Procedure: LAPAROSCOPIC  CHOLECYSTECTOMY WITH INTRAOPERATIVE CHOLANGIOGRAM;  Surgeon: Debby Hila, MD;  Location: [facility] ORS;  Service: General;  Laterality: N/A;   EYE SURGERY  12/2019   cataracts   FRACTURE SURGERY  2016   Rt.  leg   JOINT REPLACEMENT  09/2015   Right knee   LASIK     MENISCUS REPAIR Right 2011   [facility]thopedic in [facility]   TOTAL KNEE ARTHROPLASTY Right 09/20/2015   TOTAL KNEE ARTHROPLASTY Right 09/20/2015   Procedure: TOTAL KNEE ARTHROPLASTY;  Surgeon: Lamar Millman, MD;  Location: [facility] OR;  Service: Orthopedics;  Laterality: Right;   Trigger Thumb Right 2015   Done in [facility] at the same time as the carpal tunnel release   TUBAL LIGATION  1980     Current Outpatient Medications:    acetaminophen  (TYLENOL ) 500 MG tablet, Take 500 mg by mouth every 6 (six) hours as needed for moderate pain (pain score 4-6)., Disp: , Rfl:    amLODipine -benazepril  (LOTREL ) 5-20 MG capsule, Take 1 capsule by mouth daily., Disp: 90 capsule, Rfl: 3   aspirin  EC 81 MG tablet, Take 1 tablet (81 mg total) by mouth daily. Swallow whole., Disp: 30 tablet, Rfl: 12   atorvastatin  (LIPITOR) 20 MG tablet, Take 1 tablet (20 mg total) by mouth daily., Disp: 90 tablet, Rfl: 3   busPIRone  (BUSPAR ) 5 MG tablet, Take 1 tablet (5 mg total) by mouth 2 (two) times daily., Disp: 60 tablet, Rfl: 1   Lacosamide  100 MG TABS, Take 1 tablet (100 mg total) by mouth 2 (two) times daily., Disp: 60 tablet, Rfl: 0   Multiple Vitamins-Minerals (MULTIVITAMIN WOMEN 50+) TABS, Take 1 tablet by mouth daily., Disp: , Rfl:    omeprazole  (PRILOSEC) 40 MG capsule, Take 1 capsule (40 mg total) by mouth daily., Disp: 90 capsule, Rfl: 1   OVER THE COUNTER MEDICATION, Take 1 capsule by mouth every 12 (twelve) hours as needed (headache, cold/flu-like symptoms). OTC cold capsules, Disp: , Rfl:    Polyethyl Glycol-Propyl Glycol 0.4-0.3 % SOLN, Place 1 drop into both eyes at bedtime., Disp: , Rfl:    potassium chloride  (KLOR-CON  M) 10 MEQ tablet, Take 1 tablet (10  mEq total) by mouth daily., Disp: 14 tablet, Rfl: 0   raloxifene  (EVISTA ) 60 MG tablet, Take 1 tablet (60 mg total) by mouth daily., Disp: 90 tablet, Rfl: 3   zolpidem  (AMBIEN ) 5 MG tablet, Take 1 tablet (5 mg total) by mouth at bedtime as needed for sleep., Disp: 30 tablet, Rfl: 1   methocarbamol  (ROBAXIN ) 500 MG tablet, Take 1 tablet (500 mg total) by mouth every 8 (eight) hours as needed for muscle spasms., Disp: 60 tablet, Rfl: 1   venlafaxine  XR (EFFEXOR -XR) 150 MG 24 hr capsule, Take 1 capsule (150 mg total) by mouth daily with breakfast., Disp: 30 capsule, Rfl: 1  Allergies  Allergen Reactions   Penicillins Hives    Tolerates Cephalosporins    Prednisone  Other (See Comments)    Hyperactivity Sleep disturbance Confusion   Sulfa Antibiotics Hives        ROS neg/noncontributory except as  noted HPI/below      Objective:     BP 124/64   Pulse 97   Temp 98 F (36.7 C)   Ht 5' 2 (1.575 m)   Wt 182 lb 9.6 oz (82.8 kg)   SpO2 98%   BMI 33.40 kg/m  Wt Readings from Last 3 Encounters:  01/04/24 182 lb 9.6 oz (82.8 kg)  12/27/23 179 lb 7.3 oz (81.4 kg)  12/23/23 183 lb 6.8 oz (83.2 kg)    Physical Exam VITALS: BP- 124/64 GENERAL: Well developed, well nourished, no acute distress x anxious. HEAD EYES EARS NOSE THROAT: Normocephalic, atraumatic, conjunctiva not injected, sclera nonicteric. CARDIAC: Regular rate and rhythm, S1 S2 present, no murmur, dorsalis pedis 2+ bilaterally. NECK: Supple, no thyromegaly, no lymphadenopathy, no carotid bruits. LUNGS: Clear to auscultation bilaterally, no wheezes. ABDOMEN: Bowel sounds present, soft, non-tender, non-distended, no hepatosplenomegaly, no masses. EXTREMITIES: No edema. MUSCULOSKELETAL: No gross abnormalities. NEUROLOGICAL: Alert and oriented x3, cranial nerves II through XII intact. PSYCHIATRIC: Normal mood, good eye contact.  Reviewed hosp records, labs, etc.   I personally spent a total of 60 minutes in the care of  the patient today including preparing to see the patient, getting/reviewing separately obtained history, performing a medically appropriate exam/evaluation, counseling and educating, placing orders, referring and communicating with other health care professionals, documenting clinical information in the EHR, independently interpreting results, and communicating results.        Assessment & Plan:  Encephalopathy, metabolic  Hypokalemia -     Comprehensive metabolic panel with GFR -     Magnesium   High risk medication use -     CBC with Differential/Platelet -     Comprehensive metabolic panel with GFR -     Magnesium   Anemia, unspecified type  Primary insomnia  MDD (recurrent major depressive disorder) in remission -     Venlafaxine  HCl ER; Take 1 capsule (150 mg total) by mouth daily with breakfast.  Dispense: 30 capsule; Refill: 1  Generalized anxiety disorder -     Venlafaxine  HCl ER; Take 1 capsule (150 mg total) by mouth daily with breakfast.  Dispense: 30 capsule; Refill: 1 -     busPIRone  HCl; Take 1 tablet (5 mg total) by mouth 2 (two) times daily.  Dispense: 60 tablet; Refill: 1  B12 deficiency -     Cyanocobalamin   Seizure-like activity (HCC)  Other orders -     Methocarbamol ; Take 1 tablet (500 mg total) by mouth every 8 (eight) hours as needed for muscle spasms.  Dispense: 60 tablet; Refill: 1 -     Zolpidem  Tartrate; Take 1 tablet (5 mg total) by mouth at bedtime as needed for sleep.  Dispense: 30 tablet; Refill: 1    Assessment and Plan Assessment & Plan Insomnia with prescription management concerns   Chronic insomnia, worsened by anxiety, has been exacerbated by recent hospitalization and medication changes. Concerns exist about Ambien  overuse leading to potential overdose and contributing to hospitalization. She has been without Ambien   resulting in significant sleep disturbances. Prescribe low-dose Ambien  with strict monitoring to prevent overdose. Encourage  contacting the sleep center at [facility] for further evaluation and management. Discuss cognitive behavioral therapy for sleep as a long-term solution. Pdmp checked.  Advised not my specialty but pt needs help and husband will monitor meds/use. Declines psych.    Generalized anxiety disorder with medication management   Chronic anxiety is managed with venlafaxine , but there are concerns about its efficacy . Previous trials of Buspar   and hydroxyzine  had limited success but more since she was gaging sleep as the end point. She is dissatisfied with current psychiatric care and seeks alternative options. Prescribe Buspar  at a low dose to manage anxiety and made aware this is not for affecting sleep.  I am working more on the anxiety side of things. Encourage contacting the sleep center at [facility] for further evaluation and management. Discuss potential referral to a new psychiatrist if current management is unsatisfactory.  Anemia and vitamin B12 deficiency   Vitamin B12 levels are low, with recent levels at 235. Anemia and other symptoms may relate to B12 deficiency. She was not taking B12 supplements prior to hospitalization. Administer B12 injection and encourage resumption of oral B12 supplementation. She will contact GI for f/u of her anemia  Blood pressure is well-controlled on Lotrel  alone. Discontinue diuretic permanently as caused hypokalemia.  Recent falls with rib fractures and persistent back pain   Multiple falls have resulted in rib fractures and persistent back pain. Pain is migratory and affects various areas of the back. Methocarbamol  is being used for pain management, but its efficacy is questionable. Continue home physical therapy exercises. Follow up with sports medicine for further evaluation of back pain. Consider discontinuing methocarbamol  if not effective and advised not to overuse.  Confusion/encephalopathy, possible seizure or stroke (under evaluation)   Recent  hospitalization for confusion and encephalopathy with differential diagnosis including small stroke, transient global amnesia, or seizure. MRI and EEG results are conflicting. Neurologist consultation is pending for further evaluation. Follow up with neurologist for further evaluation and management.  General Health Maintenance   Routine health maintenance discussed, including medication management and lifestyle modifications. Ensure home safety measures are in place to prevent falls, including securing loose rugs and installing grab bars. Encourage regular physical activity as tolerated.     Return in about 4 weeks (around 02/01/2024) for chronic follow-up.  Jenkins CHRISTELLA Carrel, MD

## 2024-01-05 ENCOUNTER — Other Ambulatory Visit (HOSPITAL_COMMUNITY): Payer: Self-pay

## 2024-01-07 ENCOUNTER — Other Ambulatory Visit (HOSPITAL_COMMUNITY): Payer: Self-pay

## 2024-01-08 ENCOUNTER — Encounter: Payer: Self-pay | Admitting: Neurology

## 2024-01-08 ENCOUNTER — Inpatient Hospital Stay: Admitting: Neurology

## 2024-01-08 NOTE — Progress Notes (Unsigned)
 [name] [id number] [birth year]   Chief Complaint:  Referring Provider: Wendolyn Jenkins Jansky, MD Primary GI MD: Sampson  HPI: [name] is a 78 y.o. female with past medical history of anxiety/depression, CKD, HTN, HLD, osteopenia, seizures who presents today for a complaint of diarrhea.    Referred by PCP 11/27/2023 for chronic intermittent diarrhea.  Reportedly has had a previous colonoscopy over 8 years ago, recent Cologuard test was normal 2021.  No plan for repeat colonoscopy due to age.  Recent hospital admission 12/16/2023 to 12/23/2023.  Presented to the ED with altered mental status.  On prior hospitalization was monitored on EEG which did not demonstrate any seizure activity.  Was started on Vimpat  50 mg twice daily, was unclear if she was taking the medication.  LP was attempted but was unsuccessful even with fluoroscopy guidance.  Encephalopathy improved.  She was discharged home.  Workup in the ED showed CT head negative for acute intracranial abnormality.  Admitted for further evaluation and neurology was consulted.  MRI ruled out stroke.  EEG showed generalized cerebral dysfunction, no seizure or epileptic form discharges were seen.  Echo showed LVEF of 65 to 70%.  Patient had been taking cannabis products that could have been contributing to her symptoms.  Advised to discontinue.  Discharged to acute inpatient rehab. Was noted to have chronic diarrhea during that admission, possible infection, less likely inflammatory microscopic colitis or malignancy.  Diarrhea was improving.  Advised to keep follow-up appointment with GI outpatient.  Has stable anemia on recent labs, low normal potassium and is on supplementation.  Has had low magnesium  and advised to take supplement daily.  Alk phos elevated from fractures.  Discussed the use of AI scribe software for clinical note transcription with the patient, who gave verbal consent to proceed.  History of Present  Illness       Previous GI Procedures/Imaging      Past Medical History:  Diagnosis Date   Allergy 1980's   Sulfa drugs and penecillin   Anxiety    Cataract 2021   Corrected   Chronic kidney disease 2024   Stage 3   Depression    Headache    SINUS   Hyperlipidemia    Hypertension 2021   Osteopenia    Osteopenia    Primary localized osteoarthritis of right knee    Seizures (HCC) 10/2023    Past Surgical History:  Procedure Laterality Date   BREAST EXCISIONAL BIOPSY Right    benign cyst removal    CARPAL TUNNEL RELEASE Right 2015   Ortho in [place]   CHOLECYSTECTOMY N/A 02/08/2022   Procedure: LAPAROSCOPIC CHOLECYSTECTOMY WITH INTRAOPERATIVE CHOLANGIOGRAM;  Surgeon: Debby Hila, MD;  Location: [facility];  Service: General;  Laterality: N/A;   EYE SURGERY  12/2019   cataracts   FRACTURE SURGERY  2016   Rt.  leg   JOINT REPLACEMENT  09/2015   Right knee   LASIK     MENISCUS REPAIR Right 2011   Orthopedic in [facility]   TOTAL KNEE ARTHROPLASTY Right 09/20/2015   TOTAL KNEE ARTHROPLASTY Right 09/20/2015   Procedure: TOTAL KNEE ARTHROPLASTY;  Surgeon: Lamar Millman, MD;  Location: [facility] OR;  Service: Orthopedics;  Laterality: Right;   Trigger Thumb Right 2015   Done in [facility] at the same time as the carpal tunnel release   TUBAL LIGATION  1980    Current Outpatient Medications  Medication Sig Dispense Refill   acetaminophen  (TYLENOL ) 500 MG tablet Take 500 mg  by mouth every 6 (six) hours as needed for moderate pain (pain score 4-6).     amLODipine -benazepril  (LOTREL ) 5-20 MG capsule Take 1 capsule by mouth daily. 90 capsule 3   aspirin  EC 81 MG tablet Take 1 tablet (81 mg total) by mouth daily. Swallow whole. 30 tablet 12   atorvastatin  (LIPITOR) 20 MG tablet Take 1 tablet (20 mg total) by mouth daily. 90 tablet 3   busPIRone  (BUSPAR ) 5 MG tablet Take 1 tablet (5 mg total) by mouth 2 (two) times daily. 60 tablet 1   Lacosamide  100 MG TABS Take 1 tablet (100 mg total)  by mouth 2 (two) times daily. 60 tablet 0   methocarbamol  (ROBAXIN ) 500 MG tablet Take 1 tablet (500 mg total) by mouth every 8 (eight) hours as needed for muscle spasms. 60 tablet 1   Multiple Vitamins-Minerals (MULTIVITAMIN WOMEN 50+) TABS Take 1 tablet by mouth daily.     omeprazole  (PRILOSEC) 40 MG capsule Take 1 capsule (40 mg total) by mouth daily. 90 capsule 1   OVER THE COUNTER MEDICATION Take 1 capsule by mouth every 12 (twelve) hours as needed (headache, cold/flu-like symptoms). OTC cold capsules     Polyethyl Glycol-Propyl Glycol 0.4-0.3 % SOLN Place 1 drop into both eyes at bedtime.     potassium chloride  (KLOR-CON  M) 10 MEQ tablet Take 1 tablet (10 mEq total) by mouth daily. 30 tablet 0   raloxifene  (EVISTA ) 60 MG tablet Take 1 tablet (60 mg total) by mouth daily. 90 tablet 3   venlafaxine  XR (EFFEXOR -XR) 150 MG 24 hr capsule Take 1 capsule (150 mg total) by mouth daily with breakfast. 30 capsule 1   zolpidem  (AMBIEN ) 5 MG tablet Take 1 tablet (5 mg total) by mouth at bedtime as needed for sleep. 30 tablet 1   No current facility-administered medications for this visit.    Allergies as of 01/09/2024 - Review Complete 01/04/2024  Allergen Reaction Noted   Penicillins Hives 06/14/2015   Prednisone  Other (See Comments) 12/26/2021   Sulfa antibiotics Hives 06/14/2015    Family History  Problem Relation Age of Onset   Heart disease Mother    Arthritis Mother    Heart failure Mother    Heart attack Mother    Anxiety disorder Mother    Depression Mother    Hearing loss Mother    Varicose Veins Mother    Early death Father    Heart attack Father    Hearing loss Father    Heart disease Father    Hyperlipidemia Sister    Heart disease Sister    Hearing loss Sister    Arthritis Sister    Irregular heart beat Sister    Hypertension Sister    Hypertension Brother    Hyperlipidemia Brother    Heart attack Brother        Le[employer] (Brother)   Asthma Brother    Alcohol abuse  Brother    Heart disease Brother    Early death Brother    Heart attack Brother    Depression Son    Alcohol abuse Maternal Aunt    Alcohol abuse Maternal Aunt     Social History   Tobacco Use   Smoking status: Never   Smokeless tobacco: Never  Vaping Use   Vaping status: Never Used  Substance Use Topics   Alcohol use: Not Currently    Comment: previously 1 glass a month   Drug use: Never     Review of Systems:  Constitutional: No weight loss, fever, chills, weakness or fatigue Eyes: No change in vision Ears, Nose, Throat:  No change in hearing or congestion Skin: No rash or itching Cardiovascular: No chest pain, chest pressure or palpitations   Respiratory: No SOB or cough Gastrointestinal: See HPI and otherwise negative Genitourinary: No dysuria or change in urinary frequency Neurological: No headache, dizziness or syncope Musculoskeletal: No new muscle or joint pain Hematologic: No bleeding or bruising    Physical Exam:  Vital signs: There were no vitals taken for this visit.  Constitutional: NAD, Well developed, Well nourished, alert and cooperative Head:  Normocephalic and atraumatic.  Eyes: No scleral icterus. Conjunctiva pink. Mouth: No oral lesions. Respiratory: Respirations even and unlabored. Lungs clear to auscultation bilaterally.  No wheezes, crackles, or rhonchi.  Cardiovascular:  Regular rate and rhythm. No murmurs. No peripheral edema. Gastrointestinal:  Soft, nondistended, nontender. No rebound or guarding. Normal bowel sounds. No appreciable masses or hepatomegaly. Rectal:  Not performed.  Neurologic:  Alert and oriented x4;  grossly normal neurologically.  Skin:   Dry and intact without significant lesions or rashes. Psychiatric: Oriented to person, place and time. Demonstrates good judgement and reason without abnormal affect or behaviors.   RELEVANT LABS AND IMAGING: CBC    Component Value Date/Time   WBC 8.8 01/04/2024 1357   RBC 3.94  01/04/2024 1357   HGB 11.1 (L) 01/04/2024 1357   HCT 33.9 (L) 01/04/2024 1357   PLT 473.0 (H) 01/04/2024 1357   MCV 86.0 01/04/2024 1357   MCH 27.9 12/27/2023 0513   MCHC 32.6 01/04/2024 1357   RDW 18.2 (H) 01/04/2024 1357   LYMPHSABS 1.6 01/04/2024 1357   MONOABS 0.5 01/04/2024 1357   EOSABS 0.1 01/04/2024 1357   BASOSABS 0.1 01/04/2024 1357    CMP     Component Value Date/Time   NA 139 01/04/2024 1357   K 3.5 01/04/2024 1357   CL 108 01/04/2024 1357   CO2 21 01/04/2024 1357   GLUCOSE 103 (H) 01/04/2024 1357   BUN 23 01/04/2024 1357   BUN 15 06/21/2014 0000   CREATININE 0.92 01/04/2024 1357   CREATININE 1.10 (H) 06/30/2021 0000   CALCIUM  9.0 01/04/2024 1357   PROT 7.3 01/04/2024 1357   ALBUMIN 4.2 01/04/2024 1357   AST 23 01/04/2024 1357   ALT 21 01/04/2024 1357   ALKPHOS 153 (H) 01/04/2024 1357   BILITOT 0.3 01/04/2024 1357   GFRNONAA 52 (L) 12/27/2023 0513   GFRNONAA 46 (L) 06/30/2020 1129   GFRAA 53 (L) 06/30/2020 1129     Assessment/Plan:    Assessment and Plan Assessment & Plan        Camie Furbish, PA-C [facility] 01/08/2024, 5:42 PM  Patient Care Team: Wendolyn Jenkins Jansky, MD as PCP - General (Family Medicine)

## 2024-01-09 ENCOUNTER — Other Ambulatory Visit (INDEPENDENT_AMBULATORY_CARE_PROVIDER_SITE_OTHER)

## 2024-01-09 ENCOUNTER — Ambulatory Visit: Admitting: Gastroenterology

## 2024-01-09 ENCOUNTER — Encounter: Payer: Self-pay | Admitting: Gastroenterology

## 2024-01-09 VITALS — BP 156/82 | HR 127 | Ht 62.0 in | Wt 177.0 lb

## 2024-01-09 DIAGNOSIS — K529 Noninfective gastroenteritis and colitis, unspecified: Secondary | ICD-10-CM | POA: Diagnosis not present

## 2024-01-09 DIAGNOSIS — R109 Unspecified abdominal pain: Secondary | ICD-10-CM

## 2024-01-09 DIAGNOSIS — E876 Hypokalemia: Secondary | ICD-10-CM

## 2024-01-09 DIAGNOSIS — R159 Full incontinence of feces: Secondary | ICD-10-CM

## 2024-01-09 DIAGNOSIS — Z9049 Acquired absence of other specified parts of digestive tract: Secondary | ICD-10-CM

## 2024-01-09 DIAGNOSIS — F419 Anxiety disorder, unspecified: Secondary | ICD-10-CM

## 2024-01-09 DIAGNOSIS — R569 Unspecified convulsions: Secondary | ICD-10-CM

## 2024-01-09 LAB — MAGNESIUM: Magnesium: 1.4 mg/dL — ABNORMAL LOW (ref 1.5–2.5)

## 2024-01-09 LAB — POTASSIUM: Potassium: 3.6 meq/L (ref 3.5–5.1)

## 2024-01-09 NOTE — Patient Instructions (Addendum)
 Your provider has requested that you go to the basement level for lab work before leaving today. Press B on the elevator. The lab is located at the first door on the left as you exit the elevator.   Avoid lactose for 3 weeks, call with an update.   Start daily fiber supplement.   Continue Imodium  as needed.   Keep follow up appointment with PCP.    Ways to prevent diarrhea with magnesium :  1) Don't take all your magnesium  at the same time, have 2-3 smaller doses through out the day 2) Try taking your magnesium  with high fiber meals.  3) If this does not help, take the magnesium  on an empty stomach. Fiber for some people can bind the magnesium  too well and prevent absorption in your gut.

## 2024-01-09 NOTE — Therapy (Incomplete)
 OUTPATIENT PHYSICAL THERAPY NEURO EVALUATION   Patient Name: [name] MRN: [id number] DOB:Jan 12, 1946, 78 y.o., female Today's Date: 01/09/2024   PCP: Wendolyn Jenkins Jansky, MD  REFERRING PROVIDER: Maurice Sharlet RAMAN, PA-C  END OF SESSION:   Past Medical History:  Diagnosis Date   Allergy 1980's   Sulfa drugs and penecillin   Anxiety    Cataract 2021   Corrected   Chronic kidney disease 2024   Stage 3   Depression    Headache    SINUS   Hyperlipidemia    Hypertension 2021   Osteopenia    Osteopenia    Primary localized osteoarthritis of right knee    Seizures (HCC) 10/2023   Past Surgical History:  Procedure Laterality Date   BREAST EXCISIONAL BIOPSY Right    benign cyst removal    CARPAL TUNNEL RELEASE Right 2015   Ortho in [place]   CHOLECYSTECTOMY N/A 02/08/2022   Procedure: LAPAROSCOPIC CHOLECYSTECTOMY WITH INTRAOPERATIVE CHOLANGIOGRAM;  Surgeon: Debby Hila, MD;  Location: [facility];  Service: General;  Laterality: N/A;   EYE SURGERY  12/2019   cataracts   FRACTURE SURGERY  2016   Rt.  leg   JOINT REPLACEMENT  09/2015   Right knee   LASIK     MENISCUS REPAIR Right 2011   [facility]thopedic in [facility]   TOTAL KNEE ARTHROPLASTY Right 09/20/2015   TOTAL KNEE ARTHROPLASTY Right 09/20/2015   Procedure: TOTAL KNEE ARTHROPLASTY;  Surgeon: Lamar Millman, MD;  Location: [facility] OR;  Service: Orthopedics;  Laterality: Right;   Trigger Thumb Right 2015   Done in [place] at the same time as the carpal tunnel release   TUBAL LIGATION  1980   Patient Active Problem List   Diagnosis Date Noted   Anxiety state 12/26/2023   Debility 12/24/2023   Dizziness 12/16/2023   Seizure (HCC) 10/23/2023   Encephalopathy, metabolic 10/23/2023   AKI (acute kidney injury) 10/23/2023   Hypokalemia 10/23/2023   Seizure-like activity (HCC) 10/17/2023   Trigger finger, left ring finger 09/04/2023   DOE (dyspnea on exertion) 03/02/2023   Stage 3 chronic kidney disease (HCC) 03/02/2023    Left carpal tunnel syndrome 07/25/2022   Primary osteoarthritis of both first carpometacarpal joints 07/25/2022   Arthritis 07/04/2022   Anxiety 07/04/2022   Depression 07/04/2022   Primary osteoarthritis involving multiple joints 06/11/2022   Aortic atherosclerosis 02/05/2022   Facial lesion 09/30/2021   Bilateral hand pain 09/30/2021   Impingement syndrome, shoulder, left 02/16/2021   Impingement syndrome, shoulder, right 09/07/2020   MDD (recurrent major depressive disorder) in remission 09/02/2019   Generalized anxiety disorder 09/02/2019   Primary insomnia 08/16/2019   Post-menopausal 05/14/2017   Prediabetes 01/12/2017   Snoring 07/20/2016   Obsessive-compulsive personality trait 12/07/2015   Primary localized osteoarthritis of right knee    Essential hypertension 08/04/2015   Hyperlipidemia 08/04/2015   Osteopenia 08/04/2015    ONSET DATE: 12/16/23  REFERRING DIAG: G93.41 (ICD-10-CM) - Encephalopathy, metabolic  THERAPY DIAG:  No diagnosis found.  Rationale for Evaluation and Treatment: Rehabilitation  SUBJECTIVE:  SUBJECTIVE STATEMENT: *** Pt accompanied by: {accompnied:27141}  PERTINENT HISTORY: Anxiety, CKD, depression, HA, HLD, HTN, seizures, R TKA, breast lumpectomy   PAIN:  Are you having pain? {OPRCPAIN:27236}  PRECAUTIONS: {Therapy precautions:24002}  RED FLAGS: {PT Red Flags:29287}   WEIGHT BEARING RESTRICTIONS: {Yes ***/No:24003}  FALLS: Has patient fallen in last 6 months? {fallsyesno:27318}  LIVING ENVIRONMENT: Lives with: {OPRC lives with:25569::lives with their family} Lives in: {Lives in:25570} Stairs: {opstairs:27293} Has following equipment at home: {Assistive devices:23999}  PLOF: {PLOF:24004}  PATIENT GOALS: ***  OBJECTIVE:  Note: Objective  measures were completed at Evaluation unless otherwise noted.  DIAGNOSTIC FINDINGS:  12/19/23 brain MRI:  Unchanged punctate focus of restricted diffusion in the right hippocampus as described on 12/16/2023 MRI.  12/17/23 MR head angio: Normal intracranial MRA. No large vessel occlusion or other vascular abnormality. No hemodynamically significant or correctable stenosis.  COGNITION: Overall cognitive status: {cognition:24006}   SENSATION: {sensation:27233}  COORDINATION: Alternating pronation/supination: *** Alternating toe tap: *** Finger to nose: *** Heel to shin: ***    EDEMA:  {edema:24020}  MUSCLE TONE: {LE tone:25568}   POSTURE: {posture:25561}  LOWER EXTREMITY ROM:     Active  Right Eval Left Eval  Hip flexion    Hip extension    Hip abduction    Hip adduction    Hip internal rotation    Hip external rotation    Knee flexion    Knee extension    Ankle dorsiflexion    Ankle plantarflexion    Ankle inversion    Ankle eversion     (Blank rows = not tested)  LOWER EXTREMITY MMT:    MMT Right Eval Left Eval  Hip flexion    Hip extension    Hip abduction    Hip adduction    Hip internal rotation    Hip external rotation    Knee flexion    Knee extension    Ankle dorsiflexion    Ankle plantarflexion    Ankle inversion    Ankle eversion    (Blank rows = not tested)  GAIT: Findings: Assistive device utilized:{Assistive devices:23999}, Level of assistance: {Levels of assistance:24026}, and Comments: ***  FUNCTIONAL TESTS:  {Functional tests:24029}  PATIENT SURVEYS:  {rehab surveys:24030}                                                                                                                              TREATMENT DATE: ***    PATIENT EDUCATION: Education details: *** Person educated: {Person educated:25204} Education method: {Education Method:25205} Education comprehension: {Education Comprehension:25206}  HOME EXERCISE  PROGRAM: ***  GOALS: Goals reviewed with patient? {yes/no:20286}  SHORT TERM GOALS: Target date: {follow up:25551}  Patient to be independent with initial HEP. Baseline: HEP initiated Goal status: {GOALSTATUS:25110}    LONG TERM GOALS: Target date: {follow up:25551}  Patient to be independent with advanced HEP. Baseline: Not yet initiated  Goal status: {GOALSTATUS:25110}  Patient to demonstrate B LE strength >/=4+/5.  Baseline: See  above Goal status: {GOALSTATUS:25110}  Patient to demonstrate *** ROM WFL and without pain limiting.  Baseline: *** Goal status: {GOALSTATUS:25110}  Patient to report and demonstrate improved head, neck, and shoulder posture at rest and with activity.  Baseline: *** Goal status: {GOALSTATUS:25110}  Patient to demonstrate alternating reciprocal pattern when ascending and descending stairs with good stability and 1 handrail as needed.   Baseline: Unable Goal status: {GOALSTATUS:25110}  Patient to score at least 20/24 on DGI in order to decrease risk of falls.  Baseline: *** Goal status: {GOALSTATUS:25110}  Patient to complete TUG in <14 sec with LRAD in order to decrease risk of falls.   Baseline: *** Goal status: {GOALSTATUS:25110}  Patient to demonstrate 5xSTS test in <15 sec in order to decrease risk of falls.  Baseline: *** Goal status: {GOALSTATUS:25110}  Patient to score at least ***/56 on Berg in order to decrease risk of falls.  Baseline: *** Goal status: {GOALSTATUS:25110}  Patient to score at least *** on FOTO in order to indicate improved functional outcomes.  Baseline: *** Goal status: {GOALSTATUS:25110}  ASSESSMENT:  CLINICAL IMPRESSION:  Patient is a 78 y/o F presenting to [facility] s/p hospitalization 12/16/23-12/23/23 for AMS and fall with head trauma on 12/14/23 resulting in L 4-5 ribs fx. MRI showed possible punctate stroke. EEG was negative for seizures. Patient participated in CIR 12/23/23-12/27/23, D/C'd home.     Patient today presenting with ***.    Patient was educated on gentle *** HEP and reported understanding. Prior to current episode, patient was independent. Would benefit from skilled PT services *** x/week for *** weeks to address aforementioned impairments in order to optimize level of function.    OBJECTIVE IMPAIRMENTS: {opptimpairments:25111}.   ACTIVITY LIMITATIONS: {activitylimitations:27494}  PARTICIPATION LIMITATIONS: {participationrestrictions:25113}  PERSONAL FACTORS: {Personal factors:25162} are also affecting patient's functional outcome.   REHAB POTENTIAL: {rehabpotential:25112}  CLINICAL DECISION MAKING: {clinical decision making:25114}  EVALUATION COMPLEXITY: {Evaluation complexity:25115}  PLAN:  PT FREQUENCY: {rehab frequency:25116}  PT DURATION: {rehab duration:25117}  PLANNED INTERVENTIONS: {rehab planned interventions:25118::97110-Therapeutic exercises,97530- Therapeutic [phone]- Neuromuscular re-education,97535- Self Rjmz,02859- Manual therapy,Patient/Family education}  PLAN FOR NEXT SESSION: ***   Slater MARLA Christians, PT 01/09/2024, 8:36 AM

## 2024-01-11 ENCOUNTER — Other Ambulatory Visit (HOSPITAL_COMMUNITY): Payer: Self-pay

## 2024-01-11 LAB — TISSUE TRANSGLUTAMINASE ABS,IGG,IGA
(tTG) Ab, IgA: <1 U/mL
(tTG) Ab, IgG: <1 U/mL

## 2024-01-11 LAB — IGA: Immunoglobulin A: 319 mg/dL (ref 70–320)

## 2024-01-12 ENCOUNTER — Other Ambulatory Visit (HOSPITAL_COMMUNITY): Payer: Self-pay

## 2024-01-12 ENCOUNTER — Ambulatory Visit: Payer: Self-pay | Admitting: Family

## 2024-01-13 NOTE — Progress Notes (Signed)
 Attending Physician's Attestation   I have reviewed the chart.   I agree with the Advanced Practitioner's note, impression, and recommendations with any updates as below. Endoscopic evaluation may be required pending her overall symptomatology if it persists. Agree with trying to minimize magnesium  supplementation if possible. Recommend repeat blood work be checked either in our office or with PCP office within 4 weeks to evaluate signs of anemia.  Would do full anemia panel including iron indices and B12 levels.  If she is found to have evidence of iron deficiency, may need upper and lower endoscopy just for evaluation of that.  No iron indices since 2019.   Aloha Finner, MD [facility] Advanced Endoscopy Office # [phone]

## 2024-01-14 ENCOUNTER — Other Ambulatory Visit (HOSPITAL_COMMUNITY): Payer: Self-pay

## 2024-01-14 ENCOUNTER — Other Ambulatory Visit: Payer: Self-pay

## 2024-01-14 ENCOUNTER — Ambulatory Visit: Admitting: Occupational Therapy

## 2024-01-14 ENCOUNTER — Ambulatory Visit: Admitting: Physical Therapy

## 2024-01-14 ENCOUNTER — Ambulatory Visit: Payer: Self-pay | Admitting: Gastroenterology

## 2024-01-14 ENCOUNTER — Telehealth: Payer: Self-pay | Admitting: Gastroenterology

## 2024-01-14 NOTE — Telephone Encounter (Signed)
 Please have patient come in for labs in the next 4 weeks to include CBC, iron/ferritin, vitamin B12, folate. If she has evidence of iron deficiency, per Dr. Wilhelmenia would need to consider EGD/colonoscopy for further evaluation.

## 2024-01-14 NOTE — Telephone Encounter (Signed)
 Called the patient to advise. Left message of the plan and will call her again in the next 4 weeks to come for labs.

## 2024-01-15 ENCOUNTER — Other Ambulatory Visit

## 2024-01-15 DIAGNOSIS — R159 Full incontinence of feces: Secondary | ICD-10-CM

## 2024-01-15 DIAGNOSIS — Z9049 Acquired absence of other specified parts of digestive tract: Secondary | ICD-10-CM

## 2024-01-15 DIAGNOSIS — K529 Noninfective gastroenteritis and colitis, unspecified: Secondary | ICD-10-CM

## 2024-01-15 DIAGNOSIS — F419 Anxiety disorder, unspecified: Secondary | ICD-10-CM | POA: Diagnosis not present

## 2024-01-15 DIAGNOSIS — R109 Unspecified abdominal pain: Secondary | ICD-10-CM | POA: Diagnosis not present

## 2024-01-16 ENCOUNTER — Ambulatory Visit: Admitting: Physician Assistant

## 2024-01-16 ENCOUNTER — Encounter: Payer: Self-pay | Admitting: Neurology

## 2024-01-16 ENCOUNTER — Ambulatory Visit: Admitting: Neurology

## 2024-01-16 VITALS — BP 144/78 | HR 100 | Ht 62.0 in | Wt 182.0 lb

## 2024-01-16 DIAGNOSIS — R569 Unspecified convulsions: Secondary | ICD-10-CM

## 2024-01-16 DIAGNOSIS — R4781 Slurred speech: Secondary | ICD-10-CM | POA: Diagnosis not present

## 2024-01-16 DIAGNOSIS — G4700 Insomnia, unspecified: Secondary | ICD-10-CM | POA: Diagnosis not present

## 2024-01-16 DIAGNOSIS — R4789 Other speech disturbances: Secondary | ICD-10-CM | POA: Diagnosis not present

## 2024-01-16 NOTE — Progress Notes (Signed)
 [facility]  PATIENT: [name] DOB: 16-Jul-1945  REQUESTING CLINICIAN: Wendolyn Jenkins Jansky, MD HISTORY FROM: Patient/Husband  REASON FOR VISIT: Seizure in the setting of THC use    HISTORICAL  CHIEF COMPLAINT:  Chief Complaint  Patient presents with   RM13/HOSPITAL FU    Pt is here with her Husband. Pt states that she went to the hospital and is trying to determine wether she had a seizure or stroke.    INTERVAL HISTORY 01/16/2024 Patient presents today for follow-up, last visit was in September at that time we diagnosed her with a seizure that was provoked by the use of THC.  She presented to the clinic after being admitted to the hospital last month due to multiple falls, slurred speech, strokelike symptoms.  Her MRI was negative for any acute stroke and she was thought to have another breakthrough seizure in the setting of THC use.  She however claims on only taking a small amount, 1/4 of a gummy. She is currently on Vimpat  100 mg twice daily.  Her EEG in the hospital did not capture any seizures.  From her falls, she does complain of back pain and shoulder pain for which she is supposed to start physical therapy next week.  She is still complaining of word finding difficulty and slurred speech.   HISTORY OF PRESENT ILLNESS:  This is a 78 year old woman with past medical history of anxiety/depression, hypertension, hyperlipidemia, chronic insomnia who is presenting after a seizure in the setting of THC use.  Patient was struggling with insomnia, prior some THC to sleep but the next morning was acting incoherently.  Husband called EMS and en route to the hospital she did have a seizures with tongue biting and bowel or bladder incontinence.  She was admitted, workup including EEG  no seizures and her MRI was unrevealing other than brain atrophy.  Her mental status improved and she was discharged home.  5 days later she represented to the ED due to altered mental  status again EEG initially showed diffuse delta slowing which then normalized later.  No seizures was captured.  She was started on Vimpat  50 mg twice daily and has been doing well since.  She denies any previous history of seizures, denies any seizure risk factor other than using THC.  Husband tells me that her mental status is improving, however she appears more anxious.  Handedness: right handed   Onset: 10/17/2023  Seizure Type: Generalized convulsion   Current frequency: Once on 9/2  Any injuries from seizures: Back soreness, tongue biting   Seizure risk factors: use of THC  Previous ASMs: None   Currenty ASMs: Vimpat  100 mg twice daily   ASMs side effects: Denies   Brain Images: Atrophy   Previous EEGs: Diffuse slowing, repeat EEG normal    OTHER MEDICAL CONDITIONS: Anxiety/Depression, Hypertension, hyperlipidemia, Arthritis, Insomnia   REVIEW OF SYSTEMS: Full 14 system review of systems performed and negative with exception of: As noted in the HPI   ALLERGIES: Allergies  Allergen Reactions   Penicillins Hives    Tolerates Cephalosporins    Prednisone  Other (See Comments)    Hyperactivity Sleep disturbance Confusion   Sulfa Antibiotics Hives         HOME MEDICATIONS: Outpatient Medications Prior to Visit  Medication Sig Dispense Refill   acetaminophen  (TYLENOL ) 500 MG tablet Take 500 mg by mouth every 6 (six) hours as needed for moderate pain (pain score 4-6).     amLODipine -benazepril  (LOTREL ) 5-20  MG capsule Take 1 capsule by mouth daily. 90 capsule 3   aspirin  EC 81 MG tablet Take 1 tablet (81 mg total) by mouth daily. Swallow whole. 30 tablet 12   atorvastatin  (LIPITOR) 20 MG tablet Take 1 tablet (20 mg total) by mouth daily. 90 tablet 3   busPIRone  (BUSPAR ) 5 MG tablet Take 1 tablet (5 mg total) by mouth 2 (two) times daily. 60 tablet 1   Lacosamide  100 MG TABS Take 1 tablet (100 mg total) by mouth 2 (two) times daily. 60 tablet 0   Multiple  Vitamins-Minerals (MULTIVITAMIN WOMEN 50+) TABS Take 1 tablet by mouth daily.     omeprazole  (PRILOSEC) 40 MG capsule Take 1 capsule (40 mg total) by mouth daily. 90 capsule 1   OVER THE COUNTER MEDICATION Take 1 capsule by mouth every 12 (twelve) hours as needed (headache, cold/flu-like symptoms). OTC cold capsules     Polyethyl Glycol-Propyl Glycol 0.4-0.3 % SOLN Place 1 drop into both eyes at bedtime.     potassium chloride  (KLOR-CON  M) 10 MEQ tablet Take 1 tablet (10 mEq total) by mouth daily. 30 tablet 0   raloxifene  (EVISTA ) 60 MG tablet Take 1 tablet (60 mg total) by mouth daily. 90 tablet 3   venlafaxine  XR (EFFEXOR -XR) 150 MG 24 hr capsule Take 1 capsule (150 mg total) by mouth daily with breakfast. 30 capsule 1   zolpidem  (AMBIEN ) 5 MG tablet Take 1 tablet (5 mg total) by mouth at bedtime as needed for sleep. 30 tablet 1   No facility-administered medications prior to visit.    PAST MEDICAL HISTORY: Past Medical History:  Diagnosis Date   Allergy 1980's   Sulfa drugs and penecillin   Anxiety    Cataract 2021   Corrected   Chronic kidney disease 2024   Stage 3   Depression    Headache    SINUS   Hyperlipidemia    Hypertension 2021   Osteopenia    Osteopenia    Primary localized osteoarthritis of right knee    Seizures (HCC) 10/2023    PAST SURGICAL HISTORY: Past Surgical History:  Procedure Laterality Date   BREAST EXCISIONAL BIOPSY Right    benign cyst removal    CARPAL TUNNEL RELEASE Right 2015   Ortho in [place]   CHOLECYSTECTOMY N/A 02/08/2022   Procedure: LAPAROSCOPIC CHOLECYSTECTOMY WITH INTRAOPERATIVE CHOLANGIOGRAM;  Surgeon: Debby Hila, MD;  Location: [facility];  Service: General;  Laterality: N/A;   EYE SURGERY  12/2019   cataracts   FRACTURE SURGERY  2016   Rt.  leg   JOINT REPLACEMENT  09/2015   Right knee   LASIK     MENISCUS REPAIR Right 2011   [facility]thopedic in [facility]   TOTAL KNEE ARTHROPLASTY Right 09/20/2015   TOTAL KNEE ARTHROPLASTY Right  09/20/2015   Procedure: TOTAL KNEE ARTHROPLASTY;  Surgeon: Lamar Millman, MD;  Location: [facility] OR;  Service: Orthopedics;  Laterality: Right;   Trigger Thumb Right 2015   Done in [place] at the same time as the carpal tunnel release   TUBAL LIGATION  1980    FAMILY HISTORY: Family History  Problem Relation Age of Onset   Heart disease Mother    Arthritis Mother    Heart failure Mother    Heart attack Mother    Anxiety disorder Mother    Depression Mother    Hearing loss Mother    Varicose Veins Mother    Early death Father    Heart attack Father  Hearing loss Father    Heart disease Father    Hyperlipidemia Sister    Heart disease Sister    Hearing loss Sister    Arthritis Sister    Irregular heart beat Sister    Hypertension Sister    Hypertension Brother    Hyperlipidemia Brother    Heart attack Brother        Leisure Centre Manager (Brother)   Asthma Brother    Alcohol abuse Brother    Heart disease Brother    Early death Brother    Heart attack Brother    Depression Son    Alcohol abuse Maternal Aunt    Alcohol abuse Maternal Aunt     SOCIAL HISTORY: Social History   Socioeconomic History   Marital status: Married    Spouse name: Lawrence   Number of children: 1   Years of education: 17.5   Highest education level: Master's degree (e.g., MA, MS, MEng, MEd, MSW, MBA)  Occupational History   Occupation: retired    Comment: comptroller  Tobacco Use   Smoking status: Never   Smokeless tobacco: Never  Vaping Use   Vaping status: Never Used  Substance and Sexual Activity   Alcohol use: Not Currently    Comment: previously 1 glass a month   Drug use: Never   Sexual activity: Yes    Birth control/protection: Post-menopausal  Other Topics Concern   Not on file  Social History Narrative   Reads paper, walks, housework errands. Caffeine use daily.   3 grands   Social Drivers of Corporate Investment Banker Strain: Low Risk  (11/23/2023)   Overall Financial Resource  Strain (CARDIA)    Difficulty of Paying Living Expenses: Not hard at all  Food Insecurity: No Food Insecurity (12/16/2023)   Hunger Vital Sign    Worried About Running Out of Food in the Last Year: Never true    Ran Out of Food in the Last Year: Never true  Transportation Needs: No Transportation Needs (12/16/2023)   PRAPARE - Administrator, Civil Service (Medical): No    Lack of Transportation (Non-Medical): No  Physical Activity: Insufficiently Active (11/23/2023)   Exercise Vital Sign    Days of Exercise per Week: 1 day    Minutes of Exercise per Session: 20 min  Stress: Stress Concern Present (11/23/2023)   [employer] of Occupational Health - Occupational Stress Questionnaire    Feeling of Stress: To some extent  Social Connections: Moderately Integrated (12/16/2023)   Social Connection and Isolation Panel    Frequency of Communication with Friends and Family: Three times a week    Frequency of Social Gatherings with Friends and Family: Twice a week    Attends Religious Services: Never    Database Administrator or Organizations: Yes    Attends Banker Meetings: 1 to 4 times per year    Marital Status: Married  Catering Manager Violence: Not At Risk (12/17/2023)   Humiliation, Afraid, Rape, and Kick questionnaire    Fear of Current or Ex-Partner: No    Emotionally Abused: No    Physically Abused: No    Sexually Abused: No    PHYSICAL EXAM  GENERAL EXAM/CONSTITUTIONAL: Vitals:  Vitals:   01/16/24 1533  BP: (!) 144/78  Pulse: 100  SpO2: 95%  Weight: 182 lb (82.6 kg)  Height: 5' 2 (1.575 m)   Body mass index is 33.29 kg/m. Wt Readings from Last 3 Encounters:  01/16/24 182 lb (82.6 kg)  01/09/24 177 lb (80.3 kg)  01/04/24 182 lb 9.6 oz (82.8 kg)   Patient is in no distress; well developed, nourished and groomed; neck is supple  MUSCULOSKELETAL: Gait, strength, tone, movements noted in Neurologic exam below  NEUROLOGIC: MENTAL  STATUS:      No data to display         awake, alert, oriented to person, place and time recent and remote memory intact normal attention and concentration language fluent, comprehension intact, naming intact fund of knowledge appropriate  CRANIAL NERVE:  2nd, 3rd, 4th, 6th - Visual fields full to confrontation, extraocular muscles intact, no nystagmus 5th - facial sensation symmetric 7th - facial strength symmetric 8th - hearing intact 9th - palate elevates symmetrically, uvula midline 11th - shoulder shrug symmetric 12th - tongue protrusion midline  MOTOR:  normal bulk and tone, full strength in the BUE, BLE  SENSORY:  normal and symmetric to light touch  COORDINATION:  finger-nose-finger, fine finger movements normal. Resting and action tremors noted.   GAIT/STATION:  normal   DIAGNOSTIC DATA (LABS, IMAGING, TESTING) - I reviewed patient records, labs, notes, testing and imaging myself where available.  Lab Results  Component Value Date   WBC 8.8 01/04/2024   HGB 11.1 (L) 01/04/2024   HCT 33.9 (L) 01/04/2024   MCV 86.0 01/04/2024   PLT 473.0 (H) 01/04/2024      Component Value Date/Time   NA 139 01/04/2024 1357   K 3.6 01/09/2024 1258   CL 108 01/04/2024 1357   CO2 21 01/04/2024 1357   GLUCOSE 103 (H) 01/04/2024 1357   BUN 23 01/04/2024 1357   BUN 15 06/21/2014 0000   CREATININE 0.92 01/04/2024 1357   CREATININE 1.10 (H) 06/30/2021 0000   CALCIUM  9.0 01/04/2024 1357   PROT 7.3 01/04/2024 1357   ALBUMIN 4.2 01/04/2024 1357   AST 23 01/04/2024 1357   ALT 21 01/04/2024 1357   ALKPHOS 153 (H) 01/04/2024 1357   BILITOT 0.3 01/04/2024 1357   GFRNONAA 52 (L) 12/27/2023 0513   GFRNONAA 46 (L) 06/30/2020 1129   GFRAA 53 (L) 06/30/2020 1129   Lab Results  Component Value Date   CHOL 134 12/17/2023   HDL 57 12/17/2023   LDLCALC 50 12/17/2023   TRIG 135 12/17/2023   Lab Results  Component Value Date   HGBA1C 5.7 (H) 12/17/2023   Lab Results   Component Value Date   VITAMINB12 235 12/17/2023   Lab Results  Component Value Date   TSH 1.444 12/16/2023    Overnight EEG 10/24/2023 - Continuous rhythmic delta slow, generalized   Routine EEG 10/26/2023 Normal   LTM 12/17/2023 - Intermittent slow, generalized    MRI Brain 10/24/2023 1. No evidence of an acute intracranial abnormality. 2. No specific seizure etiology is identified. 3. Minor chronic small vessel ischemic changes within the pons. 4. Mild cerebral atrophy. 5. Mild-to-moderate cerebellar atrophy.   MRI Brain 12/20/2023 1. Unchanged punctate focus of restricted diffusion in the right hippocampus as described on 12/16/2023 MRI. 2. No new/interval acute abnormality  I personally reviewed brain Images and previous EEG reports.   ASSESSMENT AND PLAN  78 y.o. year old female  with history of anxiety, depression, hypertension, hyperlipidemia, chronic insomnia who is presenting for follow-up after being admitted to the hospital for multiple falls, weakness, slurred speech and aphasia.  Her MRI at that time showed a punctate focus of restriction diffusion in the right hippocampus.  I do not believe this finding is explaining her symptoms.  She was also positive on THC.  Patient's symptoms likely related to another seizure in the setting of THC use, or weakness/encephalopathy related to Ambien  use.  We discussed discontinuation of THC use, following up with PCP regarding Ambien  for insomnia as she still uses it and to continue with Vimpat  100 mg twice daily.  I will see her as scheduled in April.    1. Seizures (HCC)   2. Insomnia, unspecified type   3. Slurred speech   4. Word finding difficulty      Patient Instructions  Continue with Vimpat  100 mg twice daily  Continue to follow up with PCP  Referral to speech therapy for word finding difficulty and slurred speech  Follow up with sleep specialist  Return as scheduled in April   Per [place]  DMV  statutes, patients with seizures are not allowed to drive until they have been seizure-free for six months.  Other recommendations include using caution when using heavy equipment or power tools. Avoid working on ladders or at heights. Take showers instead of baths.  Do not swim alone.  Ensure the water temperature is not too high on the home water heater. Do not go swimming alone. Do not lock yourself in a room alone (i.e. bathroom). When caring for infants or small children, sit down when holding, feeding, or changing them to minimize risk of injury to the child in the event you have a seizure. Maintain good sleep hygiene. Avoid alcohol.  Also recommend adequate sleep, hydration, good diet and minimize stress.   During the Seizure  - First, ensure adequate ventilation and place patients on the floor on their left side  Loosen clothing around the neck and ensure the airway is patent. If the patient is clenching the teeth, do not force the mouth open with any object as this can cause severe damage - Remove all items from the surrounding that can be hazardous. The patient may be oblivious to what's happening and may not even know what he or she is doing. If the patient is confused and wandering, either gently guide him/her away and block access to outside areas - Reassure the individual and be comforting - Call 911. In most cases, the seizure ends before EMS arrives. However, there are cases when seizures may last over 3 to 5 minutes. Or the individual may have developed breathing difficulties or severe injuries. If a pregnant patient or a person with diabetes develops a seizure, it is prudent to call an ambulance. - Finally, if the patient does not regain full consciousness, then call EMS. Most patients will remain confused for about 45 to 90 minutes after a seizure, so you must use judgment in calling for help. - Avoid restraints but make sure the patient is in a bed with padded side rails - Place the  individual in a lateral position with the neck slightly flexed; this will help the saliva drain from the mouth and prevent the tongue from falling backward - Remove all nearby furniture and other hazards from the area - Provide verbal assurance as the individual is regaining consciousness - Provide the patient with privacy if possible - Call for help and start treatment as ordered by the caregiver   After the Seizure (Postictal Stage)  After a seizure, most patients experience confusion, fatigue, muscle pain and/or a headache. Thus, one should permit the individual to sleep. For the next few days, reassurance is essential. Being calm and helping reorient the person is also of importance.  Most seizures are painless and end spontaneously. Seizures are not harmful to others but can lead to complications such as stress on the lungs, brain and the heart. Individuals with prior lung problems may develop labored breathing and respiratory distress.    Discussed Patients with epilepsy have a small risk of sudden unexpected death, a condition referred to as sudden unexpected death in epilepsy (SUDEP). SUDEP is defined specifically as the sudden, unexpected, witnessed or unwitnessed, nontraumatic and nondrowning death in patients with epilepsy with or without evidence for a seizure, and excluding documented status epilepticus, in which post mortem examination does not reveal a structural or toxicologic cause for death     Orders Placed This Encounter  Procedures   Ambulatory referral to Speech Therapy    No orders of the defined types were placed in this encounter.   No follow-ups on file.   I personally spent a total of 45 minutes in the care of the patient today including preparing to see the patient, getting/reviewing separately obtained history, performing a medically appropriate exam/evaluation, counseling and educating, placing orders, referring and communicating with other health care  professionals, documenting clinical information in the EHR, and independently interpreting results.   Pastor Falling, MD 01/16/2024, 4:36 PM  [facility] [address], [address] [place], [place] [address] [phone]

## 2024-01-16 NOTE — Patient Instructions (Addendum)
 Continue with Vimpat  100 mg twice daily  Continue to follow up with PCP  Referral to speech therapy for word finding difficulty and slurred speech  Follow up with sleep specialist  Return as scheduled in April

## 2024-01-17 ENCOUNTER — Telehealth: Payer: Self-pay | Admitting: Neurology

## 2024-01-17 LAB — CLOSTRIDIUM DIFFICILE TOXIN B, QUALITATIVE, REAL-TIME PCR: Toxigenic C. Difficile by PCR: NOT DETECTED

## 2024-01-17 NOTE — Telephone Encounter (Signed)
 Referral to Speech therapy sent thru Epic to [facility]  [facility] Phone: [phone]

## 2024-01-18 ENCOUNTER — Other Ambulatory Visit

## 2024-01-19 LAB — STOOL CULTURE: E coli, Shiga toxin Assay: NEGATIVE

## 2024-01-20 LAB — PANCREATIC ELASTASE, FECAL: Pancreatic Elastase-1, Stool: 643 ug/g (ref >200)

## 2024-01-21 ENCOUNTER — Other Ambulatory Visit: Payer: Self-pay | Admitting: Family Medicine

## 2024-01-21 ENCOUNTER — Other Ambulatory Visit

## 2024-01-21 ENCOUNTER — Telehealth: Payer: Self-pay | Admitting: Family Medicine

## 2024-01-21 ENCOUNTER — Ambulatory Visit: Payer: Self-pay | Admitting: Family Medicine

## 2024-01-21 LAB — MAGNESIUM: Magnesium: 1.8 mg/dL (ref 1.5–2.5)

## 2024-01-21 NOTE — Progress Notes (Signed)
 Better.  Continue same dose of magnesium 

## 2024-01-21 NOTE — Telephone Encounter (Signed)
 Pt was scheduled by [facility] for labs but orders are not in. Pt is due for Magnesium  recheck. Please place orders.

## 2024-01-22 ENCOUNTER — Encounter: Payer: Self-pay | Admitting: Family Medicine

## 2024-01-22 ENCOUNTER — Other Ambulatory Visit: Payer: Self-pay | Admitting: Family Medicine

## 2024-01-22 DIAGNOSIS — F5101 Primary insomnia: Secondary | ICD-10-CM

## 2024-01-22 NOTE — Progress Notes (Signed)
 Pt has read results smk

## 2024-01-23 NOTE — Telephone Encounter (Signed)
 Patient notified or referral approval via MyChart.

## 2024-01-25 DIAGNOSIS — Z961 Presence of intraocular lens: Secondary | ICD-10-CM | POA: Diagnosis not present

## 2024-01-25 DIAGNOSIS — H53143 Visual discomfort, bilateral: Secondary | ICD-10-CM | POA: Diagnosis not present

## 2024-01-25 DIAGNOSIS — H5212 Myopia, left eye: Secondary | ICD-10-CM | POA: Diagnosis not present

## 2024-01-25 DIAGNOSIS — H43393 Other vitreous opacities, bilateral: Secondary | ICD-10-CM | POA: Diagnosis not present

## 2024-01-25 DIAGNOSIS — Z9849 Cataract extraction status, unspecified eye: Secondary | ICD-10-CM | POA: Diagnosis not present

## 2024-01-25 DIAGNOSIS — H52221 Regular astigmatism, right eye: Secondary | ICD-10-CM | POA: Diagnosis not present

## 2024-01-25 DIAGNOSIS — H524 Presbyopia: Secondary | ICD-10-CM | POA: Diagnosis not present

## 2024-01-29 ENCOUNTER — Other Ambulatory Visit: Payer: Self-pay | Admitting: Family Medicine

## 2024-01-29 ENCOUNTER — Other Ambulatory Visit (HOSPITAL_COMMUNITY): Payer: Self-pay

## 2024-01-29 ENCOUNTER — Other Ambulatory Visit: Payer: Self-pay | Admitting: Neurology

## 2024-01-29 ENCOUNTER — Encounter: Payer: Self-pay | Admitting: Family Medicine

## 2024-01-29 MED ORDER — LACOSAMIDE 100 MG PO TABS
100.0000 mg | ORAL_TABLET | Freq: Two times a day (BID) | ORAL | 5 refills | Status: AC
  Filled 2024-01-29: qty 60, 30d supply, fill #0
  Filled 2024-02-23 – 2024-02-27 (×2): qty 60, 30d supply, fill #1

## 2024-01-29 NOTE — Therapy (Signed)
 OUTPATIENT PHYSICAL THERAPY NEURO EVALUATION   Patient Name: [name] MRN: [id number] DOB:[birth year], 78 y.o., female Today's Date: 01/30/2024   PCP: Wendolyn Jenkins Jansky, MD  REFERRING PROVIDER: Maurice Sharlet RAMAN, PA-C  END OF SESSION:  PT End of Session - 01/30/24 0936     Visit Number 1    Number of Visits 17    Date for Recertification  03/26/24    Authorization Type Medicare/UMR/GEHA    Authorization - Number of Visits 60   combined   PT Start Time 0850    PT Stop Time 0931    PT Time Calculation (min) 41 min    Equipment Utilized During Treatment Gait belt    Activity Tolerance Patient tolerated treatment well    Behavior During Therapy [facility] for tasks assessed/performed          Past Medical History:  Diagnosis Date   Allergy 1980's   Sulfa drugs and penecillin   Anxiety    Cataract 2021   Corrected   Chronic kidney disease 2024   Stage 3   Depression    Headache    SINUS   Hyperlipidemia    Hypertension 2021   Osteopenia    Osteopenia    Primary localized osteoarthritis of right knee    Seizures (HCC) 10/2023   Past Surgical History:  Procedure Laterality Date   BREAST EXCISIONAL BIOPSY Right    benign cyst removal    CARPAL TUNNEL RELEASE Right 2015   Ortho in [place]   CHOLECYSTECTOMY N/A 02/08/2022   Procedure: LAPAROSCOPIC CHOLECYSTECTOMY WITH INTRAOPERATIVE CHOLANGIOGRAM;  Surgeon: Debby Hila, MD;  Location: [facility];  Service: General;  Laterality: N/A;   EYE SURGERY  12/2019   cataracts   FRACTURE SURGERY  2016   Rt.  leg   JOINT REPLACEMENT  09/2015   Right knee   LASIK     MENISCUS REPAIR Right 2011   Orthopedic in [facility]   TOTAL KNEE ARTHROPLASTY Right 09/20/2015   TOTAL KNEE ARTHROPLASTY Right 09/20/2015   Procedure: TOTAL KNEE ARTHROPLASTY;  Surgeon: Lamar Millman, MD;  Location: [facility] OR;  Service: Orthopedics;  Laterality: Right;   Trigger Thumb Right 2015   Done in [place] at the same time as the carpal tunnel release    TUBAL LIGATION  1980   Patient Active Problem List   Diagnosis Date Noted   Anxiety state 12/26/2023   Debility 12/24/2023   Dizziness 12/16/2023   Seizure (HCC) 10/23/2023   Encephalopathy, metabolic 10/23/2023   AKI (acute kidney injury) 10/23/2023   Hypokalemia 10/23/2023   Seizure-like activity (HCC) 10/17/2023   Trigger finger, left ring finger 09/04/2023   DOE (dyspnea on exertion) 03/02/2023   Stage 3 chronic kidney disease (HCC) 03/02/2023   Left carpal tunnel syndrome 07/25/2022   Primary osteoarthritis of both first carpometacarpal joints 07/25/2022   Arthritis 07/04/2022   Anxiety 07/04/2022   Depression 07/04/2022   Primary osteoarthritis involving multiple joints 06/11/2022   Aortic atherosclerosis 02/05/2022   Facial lesion 09/30/2021   Bilateral hand pain 09/30/2021   Impingement syndrome, shoulder, left 02/16/2021   Impingement syndrome, shoulder, right 09/07/2020   MDD (recurrent major depressive disorder) in remission 09/02/2019   Generalized anxiety disorder 09/02/2019   Primary insomnia 08/16/2019   Post-menopausal 05/14/2017   Prediabetes 01/12/2017   Snoring 07/20/2016   Obsessive-compulsive personality trait 12/07/2015   Primary localized osteoarthritis of right knee    Essential hypertension 08/04/2015   Hyperlipidemia 08/04/2015   Osteopenia 08/04/2015  ONSET DATE: 12/16/23  REFERRING DIAG: G93.41 (ICD-10-CM) - Encephalopathy, metabolic  THERAPY DIAG:  Muscle weakness (generalized)  Other symptoms and signs involving the nervous system  Unsteadiness on feet  Other low back pain  Rationale for Evaluation and Treatment: Rehabilitation  SUBJECTIVE:                                                                                                                                                                                             SUBJECTIVE STATEMENT: Patient reports that since her recent hospitalization, she has experienced B hands  shaking, sweating, and getting red, SOB when doing more activity. Reports 5-7 minutes of activity like washing the dishes before she is fatigued. This was not present at [facility]. Using 4WW now since being hospitalized but sometimes still walks without it across the room- was not using this at [facility]. Reports balance is not as strong as my legs and reports that she is glad when there is something for her to hold onto. Reports that she had a fall in the beginning of November when she broke some ribs (pt not sure which side) and B LBP and tightness and notices that she stands hunching over. Denies N/T or radiation. Denies rib pain/chest pain with coughing or sneezing    Pt accompanied by: self  PERTINENT HISTORY: Anxiety, CKD, depression, HA, HLD, HTN, seizures, R TKA, breast lumpectomy   PAIN:  Are you having pain? Yes: NPRS scale: 1-2/10 Pain location: B LB Pain description: dull, achy Aggravating factors: prolonged standing Relieving factors: heating pad  PRECAUTIONS: Fall and Other: L 4 and 5 ribs fx  RED FLAGS: None   WEIGHT BEARING RESTRICTIONS: No  FALLS: Has patient fallen in last 6 months? Yes. Number of falls 1  LIVING ENVIRONMENT: Lives with: lives with their spouse Lives in: House/apartment Stairs: 3 steps to enter back door with handrial on R; 1 story home Has following equipment at home: Single point cane, Walker - 4 wheeled, Shower bench, bed side commode, and Grab bars  PLOF: Independent, Vocation/Vocational requirements: retired, and Leisure: reading, watching sports  PATIENT GOALS: I'd like to get rid of this tight back and the walker.  OBJECTIVE:  Note: Objective measures were completed at Evaluation unless otherwise noted.  DIAGNOSTIC FINDINGS:  12/19/23 brain MRI:  Unchanged punctate focus of restricted diffusion in the right hippocampus as described on 12/16/2023 MRI.  12/17/23 MR head angio: Normal intracranial MRA. No large vessel occlusion or other  vascular abnormality. No hemodynamically significant or correctable stenosis.  12/19/23 CT angio: Acute appearing mildly displaced left fourth and fifth lateral rib fractures. Small left-sided pleural  effusion with dependent atelectasis in the left base.   COGNITION: Overall cognitive status: Within functional limits for tasks assessed   SENSATION: Pt denies N/T in UE/LEs  COORDINATION: Alternating pronation/supination: WNL B Alternating toe tap: WNL B Finger to nose: tremor, slightly slowed B   MUSCLE TONE: 1-2 bears clonus in B ankles  PALPATION: TTP over posterior R and L ribs, midline LB and B lumbar paraspinals. Very tight in B paraspinals and QL   POSTURE: rounded shoulders, forward head, increased thoracic kyphosis, and R shoulder depressed Observe slight B LE tremor in standing  LOWER EXTREMITY MMT:     MMT  (in sitting) Right Eval Left Eval  Hip flexion 4- 4  Hip extension    Hip abduction 4 4  Hip adduction 4 4  Hip internal rotation    Hip external rotation    Knee flexion 4+ 4+  Knee extension 4+ 4  Ankle dorsiflexion 4+ 4+  Ankle plantarflexion 4+ 4+  Ankle inversion    Ankle eversion     (Blank rows = not tested)  LOWER EXTREMITY ROM:    AROM Right Eval Left Eval  Hip flexion    Hip extension    Hip abduction    Hip adduction    Hip internal rotation    Hip external rotation    Knee flexion    Knee extension    Ankle dorsiflexion 29 15  Ankle plantarflexion    Ankle inversion    Ankle eversion    (Blank rows = not tested)  GAIT: Findings: Assistive device utilized:Walker - 4 wheeled, Level of assistance: Modified independence, and Comments: difficulty maneuvering in tight spaces with walker  FUNCTIONAL TESTS:  5 times sit to stand: 13.65 sec with B armrests Berg: 39/56   [facility] PT Assessment - 01/30/24 0001       Standardized Balance Assessment   Standardized Balance Assessment Berg Balance Test      Berg Balance Test   Sit to  Stand Able to stand without using hands and stabilize independently    Standing Unsupported Able to stand 2 minutes with supervision    Sitting with Back Unsupported but Feet Supported on Floor or Stool Able to sit safely and securely 2 minutes    Stand to Sit Sits safely with minimal use of hands    Transfers Able to transfer safely, minor use of hands    Standing Unsupported with Eyes Closed Able to stand 10 seconds with supervision    Standing Unsupported with Feet Together Needs help to attain position but able to stand for 30 seconds with feet together    From Standing, Reach Forward with Outstretched Arm Can reach forward >12 cm safely (5)    From Standing Position, Pick up Object from Floor Able to pick up shoe, needs supervision    From Standing Position, Turn to Look Behind Over each Shoulder Looks behind one side only/other side shows less weight shift    Turn 360 Degrees Able to turn 360 degrees safely one side only in 4 seconds or less    Standing Unsupported, Alternately Place Feet on Step/Stool Able to complete >2 steps/needs minimal assist    Standing Unsupported, One Foot in Front Able to take small step independently and hold 30 seconds    Standing on One Leg Tries to lift leg/unable to hold 3 seconds but remains standing independently    Total Score 39    Berg comment: pt required 2 sit breaks  TREATMENT DATE: 01/30/24    PATIENT EDUCATION: Education details: prognosis, POC, edu on exam findings and how they relate to functional impairments, pt's goals  Person educated: Patient Education method: Explanation Education comprehension: verbalized understanding  HOME EXERCISE PROGRAM: Not yet initiated  GOALS: Goals reviewed with patient? Yes  SHORT TERM GOALS: Target date: 02/27/2024  Patient to be independent with initial  HEP. Baseline: HEP initiated Goal status: INITIAL    LONG TERM GOALS: Target date: 03/26/2024  Patient to be independent with advanced HEP. Baseline: Not yet initiated  Goal status: INITIAL  Patient to demonstrate B LE strength >/=4+/5.  Baseline: See above Goal status: INITIAL  6 minute walk goal to be created.  Baseline: - Goal status: INITIAL  Patient to ambulate 272ft without AD with good safety and stability.  Baseline: using 4WW Goal status: INITIAL  Patient to demonstrate 5xSTS test in <15 sec without UE support in order to decrease risk of falls.  Baseline: 13 sec with B armrests Goal status: INITIAL  Patient to score at least 45/56 on Berg in order to decrease risk of falls.  Baseline: 39 Goal status: INITIAL  Patient to report tolerance for 30 minutes of chores without rest break without fatigue limiting.  Baseline: 5-7 minutes  Goal status: INITIAL  ASSESSMENT:  CLINICAL IMPRESSION:  Patient is a 78 y/o F presenting to OPPT s/p hospitalization 12/16/23-12/23/23 for AMS and fall with head trauma on 12/14/23 resulting in L 4-5 ribs fx. MRI showed possible punctate stroke. EEG was negative for seizures. Patient participated in CIR 12/23/23-12/27/23, D/C'd home. Patient today presenting with Slightly slowed coordination testing and B hand and LE tremor, B ankle clonus, TTP and tightness in posterior chain musculature, kyphotic posture, B hip weakness, imbalance, and limited functional activity tolerance. Prior to current episode, patient was independent. Would benefit from skilled PT services 2 x/week for 8 weeks to address aforementioned impairments in order to optimize level of function.    OBJECTIVE IMPAIRMENTS: Abnormal gait, decreased activity tolerance, decreased balance, decreased coordination, decreased endurance, decreased mobility, difficulty walking, decreased strength, increased muscle spasms, postural dysfunction, and pain.   ACTIVITY LIMITATIONS:  carrying, lifting, bending, sitting, standing, squatting, stairs, transfers, bathing, toileting, dressing, reach over head, hygiene/grooming, and locomotion level  PARTICIPATION LIMITATIONS: meal prep, cleaning, laundry, shopping, community activity, and church  PERSONAL FACTORS: Age, Fitness, Past/current experiences, Time since onset of injury/illness/exacerbation, and 3+ comorbidities: Anxiety, CKD, depression, HA, HLD, HTN, seizures, R TKA, breast lumpectomy  are also affecting patient's functional outcome.   REHAB POTENTIAL: Good  CLINICAL DECISION MAKING: Evolving/moderate complexity  EVALUATION COMPLEXITY: Moderate  PLAN:  PT FREQUENCY: 2x/week  PT DURATION: 8 weeks  PLANNED INTERVENTIONS: 97164- PT Re-evaluation, 97750- Physical Performance Testing, 97110-Therapeutic exercises, 97530- Therapeutic activity, 97112- Neuromuscular re-education, 97535- Self Care, [id number]- Manual therapy, [phone]- Gait training, [phone]- Canalith repositioning, [phone]- Aquatic Therapy, [phone] (1-2 muscles), [id number] (3+ muscles)- Dry Needling, Patient/Family education, Balance training, Stair training, Taping, Joint mobilization, Spinal mobilization, Vestibular training, DME instructions, Cryotherapy, and Moist heat  PLAN FOR NEXT SESSION: 6 min walk and create goal; initiate HEP with standing balance, hip strength, gentle lumbar mobility while being mindful of L ribs fx     Louana Terrilyn Christians, PT, DPT 01/30/2024 9:48 AM  [facility] at [facility] [address], [address] [place], [place] [address] Phone # [phone] Fax # [phone]

## 2024-01-30 ENCOUNTER — Ambulatory Visit: Admitting: Physical Therapy

## 2024-01-30 ENCOUNTER — Other Ambulatory Visit (HOSPITAL_COMMUNITY): Payer: Self-pay

## 2024-01-30 ENCOUNTER — Other Ambulatory Visit: Payer: Self-pay

## 2024-01-30 ENCOUNTER — Ambulatory Visit

## 2024-01-30 ENCOUNTER — Ambulatory Visit: Attending: Physical Medicine and Rehabilitation

## 2024-01-30 ENCOUNTER — Encounter: Payer: Self-pay | Admitting: Physical Therapy

## 2024-01-30 DIAGNOSIS — R29898 Other symptoms and signs involving the musculoskeletal system: Secondary | ICD-10-CM | POA: Insufficient documentation

## 2024-01-30 DIAGNOSIS — R29818 Other symptoms and signs involving the nervous system: Secondary | ICD-10-CM | POA: Insufficient documentation

## 2024-01-30 DIAGNOSIS — R41841 Cognitive communication deficit: Secondary | ICD-10-CM | POA: Diagnosis present

## 2024-01-30 DIAGNOSIS — R4701 Aphasia: Secondary | ICD-10-CM | POA: Insufficient documentation

## 2024-01-30 DIAGNOSIS — R251 Tremor, unspecified: Secondary | ICD-10-CM | POA: Insufficient documentation

## 2024-01-30 DIAGNOSIS — R2681 Unsteadiness on feet: Secondary | ICD-10-CM | POA: Insufficient documentation

## 2024-01-30 DIAGNOSIS — M6281 Muscle weakness (generalized): Secondary | ICD-10-CM | POA: Diagnosis present

## 2024-01-30 DIAGNOSIS — R278 Other lack of coordination: Secondary | ICD-10-CM | POA: Diagnosis present

## 2024-01-30 DIAGNOSIS — M5459 Other low back pain: Secondary | ICD-10-CM | POA: Diagnosis present

## 2024-01-30 DIAGNOSIS — G9341 Metabolic encephalopathy: Secondary | ICD-10-CM | POA: Diagnosis not present

## 2024-01-30 NOTE — Therapy (Signed)
 OUTPATIENT OCCUPATIONAL THERAPY NEURO EVALUATION  Patient Name: [name] MRN: [id number] DOB:05/06/1945, 78 y.o., female Today's Date: 01/30/2024  PCP: Wendolyn Jenkins Jansky, MD REFERRING PROVIDER: Maurice Sharlet RAMAN, PA-C  END OF SESSION:  OT End of Session - 01/30/24 0901     Visit Number 1    Number of Visits 9   including eval   Date for Recertification  04/29/24    Authorization Type MCR A&B, visit limit PT/OT/ST combined 60    OT Start Time 0802    OT Stop Time 0847    OT Time Calculation (min) 45 min    Activity Tolerance Patient tolerated treatment well    Behavior During Therapy [facility] for tasks assessed/performed          Past Medical History:  Diagnosis Date   Allergy 1980's   Sulfa drugs and penecillin   Anxiety    Cataract 2021   Corrected   Chronic kidney disease 2024   Stage 3   Depression    Headache    SINUS   Hyperlipidemia    Hypertension 2021   Osteopenia    Osteopenia    Primary localized osteoarthritis of right knee    Seizures (HCC) 10/2023   Past Surgical History:  Procedure Laterality Date   BREAST EXCISIONAL BIOPSY Right    benign cyst removal    CARPAL TUNNEL RELEASE Right 2015   Ortho in [place]   CHOLECYSTECTOMY N/A 02/08/2022   Procedure: LAPAROSCOPIC CHOLECYSTECTOMY WITH INTRAOPERATIVE CHOLANGIOGRAM;  Surgeon: Debby Hila, MD;  Location: [facility];  Service: General;  Laterality: N/A;   EYE SURGERY  12/2019   cataracts   FRACTURE SURGERY  2016   Rt.  leg   JOINT REPLACEMENT  09/2015   Right knee   LASIK     MENISCUS REPAIR Right 2011   Orthopedic in [facility]   TOTAL KNEE ARTHROPLASTY Right 09/20/2015   TOTAL KNEE ARTHROPLASTY Right 09/20/2015   Procedure: TOTAL KNEE ARTHROPLASTY;  Surgeon: Lamar Millman, MD;  Location: [facility];  Service: Orthopedics;  Laterality: Right;   Trigger Thumb Right 2015   Done in [place] at the same time as the carpal tunnel release   TUBAL LIGATION  1980   Patient Active Problem List    Diagnosis Date Noted   Anxiety state 12/26/2023   Debility 12/24/2023   Dizziness 12/16/2023   Seizure (HCC) 10/23/2023   Encephalopathy, metabolic 10/23/2023   AKI (acute kidney injury) 10/23/2023   Hypokalemia 10/23/2023   Seizure-like activity (HCC) 10/17/2023   Trigger finger, left ring finger 09/04/2023   DOE (dyspnea on exertion) 03/02/2023   Stage 3 chronic kidney disease (HCC) 03/02/2023   Left carpal tunnel syndrome 07/25/2022   Primary osteoarthritis of both first carpometacarpal joints 07/25/2022   Arthritis 07/04/2022   Anxiety 07/04/2022   Depression 07/04/2022   Primary osteoarthritis involving multiple joints 06/11/2022   Aortic atherosclerosis 02/05/2022   Facial lesion 09/30/2021   Bilateral hand pain 09/30/2021   Impingement syndrome, shoulder, left 02/16/2021   Impingement syndrome, shoulder, right 09/07/2020   MDD (recurrent major depressive disorder) in remission 09/02/2019   Generalized anxiety disorder 09/02/2019   Primary insomnia 08/16/2019   Post-menopausal 05/14/2017   Prediabetes 01/12/2017   Snoring 07/20/2016   Obsessive-compulsive personality trait 12/07/2015   Primary localized osteoarthritis of right knee    Essential hypertension 08/04/2015   Hyperlipidemia 08/04/2015   Osteopenia 08/04/2015    ONSET DATE: 12/25/2023  REFERRING DIAG: G93.41 (ICD-10-CM) - Encephalopathy, metabolic  THERAPY DIAG:  Muscle weakness (generalized)  Other lack of coordination  Other symptoms and signs involving the musculoskeletal system  Tremor  Rationale for Evaluation and Treatment: Rehabilitation  SUBJECTIVE:   SUBJECTIVE STATEMENT: I'm concerned about the shakes I have Pt accompanied by: self  PERTINENT HISTORY: Pt hit head during fall on 10/31. MR showed punctate 3 mm focus of restricted diffusion involving the mesial right temporal lobe/hippocampal formation. Primary differential considerations include a punctate acute ischemic  nonhemorrhagic infarct versus changes of transient global amnesia. Performing EEG to rule out seizure. Was recently admitted 10/2023 for seizure-like activity. MRI with no evidence of acute intracranial abnormality. CT chest 11/5 revealed acute non-displaced L 4-5 lateral rib fractures. PMH positive for HTN, GERD, MDD, GAD, insomina, CVA.  PRECAUTIONS: Other: seizures hx, no electrical modalities  WEIGHT BEARING RESTRICTIONS: No  PAIN:  Are you having pain? Yes: NPRS scale: 3-4/10 Pain location: lower back Pain description: aching Aggravating factors: twisting, standing prolonged Relieving factors: Tylenol   FALLS: Has patient fallen in last 6 months? Yes. Number of falls 1, fractured ribs  LIVING ENVIRONMENT: Lives with: lives with their spouse Lives in: House/apartment 1 level Stairs: Yes: External: 3 steps; on right going up Has following equipment at home: Walker - 4 wheeled, Grab bars, and walk in shower   PLOF: Independent, Independent with household mobility without device, and Independent with community mobility without device  PATIENT GOALS: I was sewing before but my coordination is not what it used to be since the shakes, I needed to hem some of my pants. I want to improve the shakes in my hands too, I have trouble getting dressed and sometimes feeding myself, it gets worse when I get tired or stressed.  OBJECTIVE:  Note: Objective measures were completed at Evaluation unless otherwise noted.  HAND DOMINANCE: Right  ADLs: Overall ADLs: Independent but does take longer Transfers/ambulation related to ADLs: Eating: Independent with increased time Grooming: Independent with increased time UB Dressing: Independent with increased time LB Dressing: Independent with increased time Toileting: Independent Bathing: Mod I Tub Shower transfers: Mod I Equipment: Shower seat with back, Grab bars, and Walk in shower  IADLs: Shopping: Husband has been completing more since  hospitalization Light housekeeping: Has hired help  Meal Prep: Has done a little, husband  Community mobility: Not allowed to drive for 6 months since d/c Medication management: husband has been setting up pill organizer since hospitalization Financial management: Husband has been completing since hospitalization Handwriting: 75% legible and tremors since hospitalization  MOBILITY STATUS: Needs Assist: utilizes rollator and Hx of falls  POSTURE COMMENTS:  No Significant postural limitations Sitting balance: WNL  ACTIVITY TOLERANCE: Activity tolerance: lower back hurts affecting ability to stand prolonged   FUNCTIONAL OUTCOME MEASURES: PSFS: 4.3 average score  UPPER EXTREMITY ROM:  WNL  Active ROM Right eval Left eval  Shoulder flexion    Shoulder abduction    Shoulder adduction    Shoulder extension    Shoulder internal rotation    Shoulder external rotation    Elbow flexion    Elbow extension    Wrist flexion    Wrist extension    Wrist ulnar deviation    Wrist radial deviation    Wrist pronation    Wrist supination    (Blank rows = not tested)  UPPER EXTREMITY MMT:     MMT Right eval Left eval  Shoulder flexion    Shoulder abduction    Shoulder adduction    Shoulder extension    Shoulder  internal rotation    Shoulder external rotation    Middle trapezius    Lower trapezius    Elbow flexion    Elbow extension    Wrist flexion    Wrist extension    Wrist ulnar deviation    Wrist radial deviation    Wrist pronation    Wrist supination    (Blank rows = not tested)  HAND FUNCTION: Grip strength: Right: 24 lbs; Left: 28 lbs  COORDINATION: 9 Hole Peg test: Right: 31.09 sec; Left: 40.35 sec Box and Blocks:  Right 38 blocks, Left 35 blocks Tremors: Resting and action Pt did hit divider a couple times d/t tremors, min cues for proper form d/t pt bringing block around divider  PPT 2 (self feeding): 16.81 seconds  Button/unbutton 3 buttons: 24.09  seconds SENSATION: WFL  EDEMA: none  MUSCLE TONE: RUE: Within functional limits and LUE: Within functional limits  COGNITION: Overall cognitive status: Within functional limits for tasks assessed  VISION: Subjective report: Sometimes I feel like my vision is a little different but I went to the optometrist last week and he said I didn't even need to change my prescription. Baseline vision: Wears glasses for reading only Visual history: cataracts  VISION ASSESSMENT: To be further assessed in functional context  Patient has difficulty with following activities due to following visual impairments: n/a  PERCEPTION: WFL  PRAXIS: Impaired: Motor planning  OBSERVATIONS: tremors and weakness in grip strength                                                                                                                             TREATMENT DATE: 01/30/24   Pt educated in purpose of OT, goals, and POC. Also educated in tremor compensation strategies, see Pt instructions for handout provided      PATIENT EDUCATION: Education details: purpose of OT, tremor compensation strategies Person educated: Patient Education method: Explanation and Handouts Education comprehension: verbalized understanding and needs further education  HOME EXERCISE PROGRAM:    GOALS: Goals reviewed with patient? Yes  SHORT TERM GOALS: Target date: 03/02/23  Patient will demonstrate HEP for B grip and coordination with 25% verbal cues or less for proper execution.  Baseline: Goal status: INITIAL  2.  Patient will independently recall at least 3 energy conservation principles in relation to ADLs to increase functional independence.  Baseline:  Goal status: INITIAL  3.  Pt will verbalize understanding of adaptive strategies to increase ease with ADLS/IADLS   Baseline:  Goal status: INITIAL  4.  Patient will demo improved FM coordination as evidenced by completing nine-hole peg with use of L hand  in 5 seconds or less.  Baseline:  Goal status: INITIAL   LONG TERM GOALS: Target date: 04/29/24  Patient will report at least two-point increase in average PSFS score or at least three-point increase in a single activity score indicating functionally significant improvement given minimum detectable change.  Baseline: 4.3 total score (See above  for individual activity scores)   Goal status: INITIAL  2.  Patient will demonstrate at least 30 lbs R grip strength as needed to open jars and other containers.  Baseline:  Right: 24 lbs; Left: 28 lbs Goal status: INITIAL  3.  Pt will demonstrate improved ease with fastening buttons as evidenced by decreasing 3 button/unbutton time by 4 seconds  Baseline: Button/unbutton 3 buttons: 24.09 seconds Goal status: INITIAL  4.  Pt will demonstrate improved ease with feeding as evidenced by decreasing PPT#2 by 3 secs  Baseline: PPT 2 (self feeding): 16.81 seconds  Goal status: INITIAL  5.  Pt will complete simulated med mgmt with 100% accuracy and no more than min verbal cues  Baseline:  Goal status: INITIAL    ASSESSMENT:  CLINICAL IMPRESSION: Patient is a 78 y.o. female who was seen today for occupational therapy evaluation for s/p hospitalization for metabolic encephalopathy. Hx includes OA of R knee, falls, seizures, CKD Stage III. Patient currently presents slightly below baseline level of functioning demonstrating functional deficits and impairments as noted below. Pt would benefit from skilled OT services in the outpatient setting to work on impairments as noted below to help pt return to PLOF as able.     PERFORMANCE DEFICITS: in functional skills including ADLs, IADLs, coordination, dexterity, strength, Fine motor control, and endurance, cognitive skills including safety awareness, and psychosocial skills including coping strategies, environmental adaptation, and routines and behaviors.   IMPAIRMENTS: are limiting patient from ADLs,  IADLs, and leisure.   CO-MORBIDITIES: may have co-morbidities  that affects occupational performance. Patient will benefit from skilled OT to address above impairments and improve overall function.  MODIFICATION OR ASSISTANCE TO COMPLETE EVALUATION: No modification of tasks or assist necessary to complete an evaluation.  OT OCCUPATIONAL PROFILE AND HISTORY: Problem focused assessment: Including review of records relating to presenting problem.  CLINICAL DECISION MAKING: LOW - limited treatment options, no task modification necessary  REHAB POTENTIAL: Good  EVALUATION COMPLEXITY: Low    PLAN:  OT FREQUENCY: 1x/week  OT DURATION: 8 weeks  PLANNED INTERVENTIONS: 97168 OT Re-evaluation, 97535 self care/ADL training, 02889 therapeutic exercise, 97530 therapeutic activity, 97112 neuromuscular re-education, psychosocial skills training, energy conservation, coping strategies training, patient/family education, and DME and/or AE instructions  RECOMMENDED OTHER SERVICES: none, receiving PT/SLP evals  CONSULTED AND AGREED WITH PLAN OF CARE: Patient  PLAN FOR NEXT SESSION: AE education (button hook, weighted pen, weighted utensils) Coordination/putty HEP Energy conservation   [employer], OT 01/30/2024, 9:15 AM

## 2024-01-30 NOTE — Therapy (Signed)
 OUTPATIENT SPEECH LANGUAGE PATHOLOGY APHASIA EVALUATION   Patient Name: [name] MRN: [id number] DOB:1945-09-27, 78 y.o., female Today's Date: 01/30/2024  PCP: Wendolyn Jenkins Jansky, MD REFERRING PROVIDER: Gregg Lek, MD  END OF SESSION:  End of Session - 01/30/24 1415     Visit Number 1    Number of Visits 17    Date for Recertification  04/04/24    SLP Start Time 0850    SLP Stop Time  0930    SLP Time Calculation (min) 40 min    Activity Tolerance Patient tolerated treatment well          Past Medical History:  Diagnosis Date   Allergy 1980's   Sulfa drugs and penecillin   Anxiety    Cataract 2021   Corrected   Chronic kidney disease 2024   Stage 3   Depression    Headache    SINUS   Hyperlipidemia    Hypertension 2021   Osteopenia    Osteopenia    Primary localized osteoarthritis of right knee    Seizures (HCC) 10/2023   Past Surgical History:  Procedure Laterality Date   BREAST EXCISIONAL BIOPSY Right    benign cyst removal    CARPAL TUNNEL RELEASE Right 2015   Ortho in [place]   CHOLECYSTECTOMY N/A 02/08/2022   Procedure: LAPAROSCOPIC CHOLECYSTECTOMY WITH INTRAOPERATIVE CHOLANGIOGRAM;  Surgeon: Debby Hila, MD;  Location: [facility];  Service: General;  Laterality: N/A;   EYE SURGERY  12/2019   cataracts   FRACTURE SURGERY  2016   Rt.  leg   JOINT REPLACEMENT  09/2015   Right knee   LASIK     MENISCUS REPAIR Right 2011   Orthopedic in [facility]   TOTAL KNEE ARTHROPLASTY Right 09/20/2015   TOTAL KNEE ARTHROPLASTY Right 09/20/2015   Procedure: TOTAL KNEE ARTHROPLASTY;  Surgeon: Lamar Millman, MD;  Location: [facility] OR;  Service: Orthopedics;  Laterality: Right;   Trigger Thumb Right 2015   Done in [place] at the same time as the carpal tunnel release   TUBAL LIGATION  1980   Patient Active Problem List   Diagnosis Date Noted   Anxiety state 12/26/2023   Debility 12/24/2023   Dizziness 12/16/2023   Seizure (HCC) 10/23/2023    Encephalopathy, metabolic 10/23/2023   AKI (acute kidney injury) 10/23/2023   Hypokalemia 10/23/2023   Seizure-like activity (HCC) 10/17/2023   Trigger finger, left ring finger 09/04/2023   DOE (dyspnea on exertion) 03/02/2023   Stage 3 chronic kidney disease (HCC) 03/02/2023   Left carpal tunnel syndrome 07/25/2022   Primary osteoarthritis of both first carpometacarpal joints 07/25/2022   Arthritis 07/04/2022   Anxiety 07/04/2022   Depression 07/04/2022   Primary osteoarthritis involving multiple joints 06/11/2022   Aortic atherosclerosis 02/05/2022   Facial lesion 09/30/2021   Bilateral hand pain 09/30/2021   Impingement syndrome, shoulder, left 02/16/2021   Impingement syndrome, shoulder, right 09/07/2020   MDD (recurrent major depressive disorder) in remission 09/02/2019   Generalized anxiety disorder 09/02/2019   Primary insomnia 08/16/2019   Post-menopausal 05/14/2017   Prediabetes 01/12/2017   Snoring 07/20/2016   Obsessive-compulsive personality trait 12/07/2015   Primary localized osteoarthritis of right knee    Essential hypertension 08/04/2015   Hyperlipidemia 08/04/2015   Osteopenia 08/04/2015    ONSET DATE: 12/16/23   REFERRING DIAG: R47.81 (ICD-10-CM) - Slurred speech R47.89 (ICD-10-CM) - Word finding difficulty  THERAPY DIAG:  Aphasia  Rationale for Evaluation and Treatment: Rehabilitation  SUBJECTIVE:   SUBJECTIVE STATEMENT: In't not  as flu- fluid as it was before. Pt accompanied by: self  PERTINENT HISTORY: PMH of HTN, HLD, osteopenia, seizures, and CKD. She was hospitalized in September with AMS. She presented to [facility] on 12/16/23 with falls and slurred speech. She had hit her head and reported nausea and diarrhea. CT head was negative for acute abnormalities. CXR revealed mild cardiomegaly with no acute infiltrates. C-spine, right knee, right rib, and left shoulder Xrs have been negative. UA was WNL. MRI showed possible punctate stroke MRA negative for  LVO. LTM EEG on 11/4 is negative for seizures. LTM EEG on 11/5 was also negative for seizures. Patient is being admitted to CIR for impaired mobility and ADLs.   PAIN:  Are you having pain? Yes: NPRS scale: 2/10 Pain location: lower back Pain description: dull Aggravating factors: standing Relieving factors: heat  FALLS: Has patient fallen in last 6 months?  See PT evaluation for details  LIVING ENVIRONMENT: Lives with: lives with their spouse Lives in: House/apartment  PLOF:  Level of assistance: Independent with ADLs, Independent with IADLs Employment: Retired  PATIENT GOALS: Get rid of this tremor. Get my talking back to normal  OBJECTIVE:  Note: Objective measures were completed at Evaluation unless otherwise noted.  DIAGNOSTIC FINDINGS:  SLE 02/23/23: Cognitive/ Linguistic. Pt presents with a mild expressive language deficit. Portions of the Western Aphasia Battery- Bedside Form completed revealing strengths in naming, auditory comprehension, and repetition. Her fluency was described as; some hesitations and word finding difficulty. Pt confirms changes to language upon hospitalization however, reports symptoms have largely resolved and she is back to baseline. Informally, some instances of phonemic paraphasias noted with pt able to self correct consistently. She also demonstrated some mild cluttering though it did not impact communication. Patient's overall cognitive functioning was not formally assessed but appeared [facility] during functional tasks. Patient also endorses that she feels she is at her cognitive baseline. She was oriented x4 and verbalized recent medical hx in detail. No follow up intervention warranted at this time.   COGNITION: Overall cognitive status: Impaired Areas of impairment:  To be assessed in first 1-2 sessions Functional deficits: Pt no longer doing monthly bills due to feeling foggy  AUDITORY COMPREHENSION: Overall auditory comprehension: Appears  intact YES/NO questions: Appears intact Following directions: Appears intact Conversation: Complex and Moderately Complex  READING COMPREHENSION: Intact  EXPRESSION: verbal  VERBAL EXPRESSION: Level of generative/spontaneous verbalization: conversation Automatic speech: day of week: intact and month of year: intact  Repetition: Appears intact Naming: Confrontation: 76-100% Pragmatics: Appears intact Comments: Pt with 1-2 word finding episodes during ST eval today. Interfering components: ? Cognitive deficits Effective technique: phonemic cues and reducing speaking rate  WRITTEN EXPRESSION: Dominant hand: right Written expression: Appears intact  MOTOR SPEECH: Overall motor speech: appears like pt has mild impairment with stuttering/cluttering. No evidence of apraxia of speech in reading task or syllables and words of increasing length Level of impairment: Sentence and Conversation Respiration: thoracic breathing Phonation: normal Resonance: hyponasality Articulation: see above in overall motor speech Intelligibility: Intelligible Motor speech errors: aware and inconsistent Effective technique: slow rate and thers TBD PRN in therapy sessions  ORAL MOTOR EXAMINATION: Overall status: WFL Comments: mild lingual weakness on lt, mild labial weakness on rt  STANDARDIZED ASSESSMENTS: BOSTON NAMING TEST: (BNT-2): pt scored just below the mean - 48/60 (mean=48.9)  PATIENT REPORTED OUTCOME MEASURES (PROM): Communication Effectiveness Survey: provided in first 1-2 sessions.  TREATMENT DATE:   01/29/25: Eval completed. N/A.  PATIENT EDUCATION: Education details: SLP suspects deficits are mostly linguistic in nature, possible goals for ST Person educated: Patient Education method: Explanation Education comprehension: verbalized  understanding   GOALS: Goals reviewed with patient? Yes  SHORT TERM GOALS: Target date: 02/29/24  Pt will undergo cognitive testing and complete PROM in first 2 sessions Baseline: Goal status: INITIAL  2.  Pt will use trained compensations for anomic episodes in 5 minutes simple conversation x2 sessions Baseline:  Goal status: INITIAL  3.  Pt will complete SFA and /or VNEST with occasional min A x2 sessions Baseline:  Goal status: INITIAL  4.  Pt will use slowed rate to foster more fluid speech in sentence responses 80% of the time in 2 sessions Baseline:  Goal status: INITIAL   LONG TERM GOALS: Target date: 04/04/24  Pt will improve PROM Baseline:  Goal status: INITIAL  2.  Pt will use trained compensations for anomic episodes in 8 minutes simple conversation x2 sessions Baseline:  Goal status: INITIAL  3.  Pt will complete SFA and /or VNEST with rare min A x2 sessions Baseline:  Goal status: INITIAL  4.  Pt will use slowed rate to foster more fluid speech 80% of the time in 8 minutes of simple conversation in 2 sessions Baseline:  Goal status: INITIAL   ASSESSMENT:  CLINICAL IMPRESSION: Patient is a 78 y.o. F who was seen today for speech/language in light of seizure and . Metabolic encephalopathy with acute kidney injury. On date of eval Chasey stated I can't come up with the word I was sometimes, and sometimes it comes out strangely. Pt also endorsed repetitions in her speech, with reduced speech fluidity since November admission. Lastly she told SLP that her husband is handling monthly bills, something pt used to do prior to hospitalization in September. SLP will administer cognitive assessment in first 1-2 sessions and add goals as necessary.  OBJECTIVE IMPAIRMENTS: include attention, memory, executive functioning, and aphasia. These impairments are limiting patient from managing medications, managing finances, household responsibilities, ADLs/IADLs, and  effectively communicating at home and in community. Factors affecting potential to achieve goals and functional outcome are co-morbidities. Patient will benefit from skilled SLP services to address above impairments and improve overall function.  REHAB POTENTIAL: Good  PLAN:  SLP FREQUENCY: 2x/week  SLP DURATION: 8 weeks  PLANNED INTERVENTIONS: Language facilitation, Environmental controls, Cognitive reorganization, Internal/external aids, Functional tasks, Multimodal communication approach, SLP instruction and feedback, Compensatory strategies, Patient/family education, and [id number] Treatment of speech (30 or 45 min)     Saathvik Every, CCC-SLP 01/30/2024, 2:16 PM

## 2024-01-30 NOTE — Patient Instructions (Signed)
   Compensation Strategies for Tremors  When eating, try the following: . Eat out of bowls, divided plates, or use a plate guard (available at a medical supply store) and eat with a spoon so that you have an edge to scoop up food. . Try raising your plate so that there is less distance between the plate and mouth. . Try stabilizing elbows on the table or against your body. . Use utensil with built-up/larger grips as they are easier to hold.  When writing, try the following:   . Stabilize forearm on the table . Take your time as rushing/being stressed can increase tremors. . Try a felt-tipped pen, it does not glide as much.  Avoid gel pens (they move too much). . Consider using pre-printed labels with your name and address (carry them with you when you go out) or you can get stamps with your address or signature on it. . Use a small tape recorder to record messages/reminders for yourself. . Use pens with bigger grips.  When brushing your teeth, putting on make-up, or styling hair, try the following: . Use an electric toothbrush. . Use items with built-up grips. . Stabilize your elbows against your body or on the counter. . Use long-handled brushes/combs. . Use a hair dryer with a stand.  In general: . Avoid stress, fatigue, or rushing as this can increase tremors. . Sit down for activities that require more control/coordination.

## 2024-02-01 NOTE — Addendum Note (Signed)
 Addended by: CONCHA NORRIS A on: 02/01/2024 11:27 AM   Modules accepted: Orders

## 2024-02-01 NOTE — Telephone Encounter (Signed)
 Patient reminded to come for labs.

## 2024-02-03 ENCOUNTER — Other Ambulatory Visit: Payer: Self-pay | Admitting: Family Medicine

## 2024-02-04 ENCOUNTER — Ambulatory Visit

## 2024-02-04 DIAGNOSIS — R4701 Aphasia: Secondary | ICD-10-CM

## 2024-02-04 DIAGNOSIS — M6281 Muscle weakness (generalized): Secondary | ICD-10-CM | POA: Diagnosis not present

## 2024-02-04 DIAGNOSIS — R41841 Cognitive communication deficit: Secondary | ICD-10-CM

## 2024-02-04 NOTE — Patient Instructions (Signed)

## 2024-02-04 NOTE — Therapy (Unsigned)
 " OUTPATIENT SPEECH LANGUAGE PATHOLOGY APHASIA TREATMENT   Patient Name: [name] MRN: [id number] DOB:[birth year], 78 y.o., female Today's Date: 02/05/2024  PCP: Wendolyn Jenkins Jansky, MD REFERRING PROVIDER: Gregg Lek, MD  END OF SESSION:  End of Session - 02/04/24 1144     Visit Number 2    Number of Visits 17    Date for Recertification  04/04/24    SLP Start Time 1106    SLP Stop Time  1146    SLP Time Calculation (min) 40 min    Activity Tolerance Patient tolerated treatment well           Past Medical History:  Diagnosis Date   Allergy 1980's   Sulfa drugs and penecillin   Anxiety    Cataract 2021   Corrected   Chronic kidney disease 2024   Stage 3   Depression    Headache    SINUS   Hyperlipidemia    Hypertension 2021   Osteopenia    Osteopenia    Primary localized osteoarthritis of right knee    Seizures (HCC) 10/2023   Past Surgical History:  Procedure Laterality Date   BREAST EXCISIONAL BIOPSY Right    benign cyst removal    CARPAL TUNNEL RELEASE Right 2015   Ortho in [place]   CHOLECYSTECTOMY N/A 02/08/2022   Procedure: LAPAROSCOPIC CHOLECYSTECTOMY WITH INTRAOPERATIVE CHOLANGIOGRAM;  Surgeon: Debby Hila, MD;  Location: [facility];  Service: General;  Laterality: N/A;   EYE SURGERY  12/2019   cataracts   FRACTURE SURGERY  2016   Rt.  leg   JOINT REPLACEMENT  09/2015   Right knee   LASIK     MENISCUS REPAIR Right 2011   [facility]thopedic in [facility]   TOTAL KNEE ARTHROPLASTY Right 09/20/2015   TOTAL KNEE ARTHROPLASTY Right 09/20/2015   Procedure: TOTAL KNEE ARTHROPLASTY;  Surgeon: Lamar Millman, MD;  Location: [facility] OR;  Service: Orthopedics;  Laterality: Right;   Trigger Thumb Right 2015   Done in [place] at the same time as the carpal tunnel release   TUBAL LIGATION  1980   Patient Active Problem List   Diagnosis Date Noted   Anxiety state 12/26/2023   Debility 12/24/2023   Dizziness 12/16/2023   Seizure (HCC) 10/23/2023    Encephalopathy, metabolic 10/23/2023   AKI (acute kidney injury) 10/23/2023   Hypokalemia 10/23/2023   Seizure-like activity (HCC) 10/17/2023   Trigger finger, left ring finger 09/04/2023   DOE (dyspnea on exertion) 03/02/2023   Stage 3 chronic kidney disease (HCC) 03/02/2023   Left carpal tunnel syndrome 07/25/2022   Primary osteoarthritis of both first carpometacarpal joints 07/25/2022   Arthritis 07/04/2022   Anxiety 07/04/2022   Depression 07/04/2022   Primary osteoarthritis involving multiple joints 06/11/2022   Aortic atherosclerosis 02/05/2022   Facial lesion 09/30/2021   Bilateral hand pain 09/30/2021   Impingement syndrome, shoulder, left 02/16/2021   Impingement syndrome, shoulder, right 09/07/2020   MDD (recurrent major depressive disorder) in remission 09/02/2019   Generalized anxiety disorder 09/02/2019   Primary insomnia 08/16/2019   Post-menopausal 05/14/2017   Prediabetes 01/12/2017   Snoring 07/20/2016   Obsessive-compulsive personality trait 12/07/2015   Primary localized osteoarthritis of right knee    Essential hypertension 08/04/2015   Hyperlipidemia 08/04/2015   Osteopenia 08/04/2015    ONSET DATE: 12/16/23   REFERRING DIAG: R47.81 (ICD-10-CM) - Slurred speech R47.89 (ICD-10-CM) - Word finding difficulty  THERAPY DIAG:  Aphasia  Cognitive communication deficit  Rationale for Evaluation and Treatment: Rehabilitation  SUBJECTIVE:  SUBJECTIVE STATEMENT: Pt was agreeable for ST this morning.  Pt accompanied by: self  PERTINENT HISTORY: PMH of HTN, HLD, osteopenia, seizures, and CKD. She was hospitalized in September with AMS. She presented to [facility] on 12/16[facility]23 with falls and slurred speech. She had hit her head and reported nausea and diarrhea. CT head was negative for acute abnormalities. CXR revealed mild cardiomegaly with no acute infiltrates. C-spine, right knee, right rib, and left shoulder Xrs have been negative. UA was WNL. MRI showed possible  punctate stroke MRA negative for LVO. LTM EEG on 11/4 is negative for seizures. LTM EEG on 11/5 was also negative for seizures. Patient is being admitted to CIR for impaired mobility and ADLs.   PAIN:  Are you having pain? Yes: NPRS scale: 2/10 Pain location: lower back Pain description: tight, dull Aggravating factors: walking or standing Relieving factors: heat  FALLS: Has patient fallen in last 6 months?  See PT evaluation for details  LIVING ENVIRONMENT: Lives with: lives with their spouse Lives in: House/apartment  PLOF:  Level of assistance: Independent with ADLs, Independent with IADLs Employment: Retired  PATIENT GOALS: Get rid of this tremor. Get my talking back to normal  OBJECTIVE:  Note: Objective measures were completed at Evaluation unless otherwise noted.  DIAGNOSTIC FINDINGS:  SLE 02/23/23: Cognitive/ Linguistic. Pt presents with a mild expressive language deficit. Portions of the Western Aphasia Battery- Bedside Form completed revealing strengths in naming, auditory comprehension, and repetition. Her fluency was described as; some hesitations and word finding difficulty. Pt confirms changes to language upon hospitalization however, reports symptoms have largely resolved and she is back to baseline. Informally, some instances of phonemic paraphasias noted with pt able to self correct consistently. She also demonstrated some mild cluttering though it did not impact communication. Patient's overall cognitive functioning was not formally assessed but appeared [facility] during functional tasks. Patient also endorses that she feels she is at her cognitive baseline. She was oriented x4 and verbalized recent medical hx in detail. No follow up intervention warranted at this time.    PATIENT REPORTED OUTCOME MEASURES (PROM): Communication Effectiveness Survey: provided in first 1-2 sessions.                                                                                                                              TREATMENT DATE:   02/04/24: Pt with one phonemic paraphasia in today's session. SLUMS today. [name] scored 24/30 however SLP wonders if pt's visual acuity/spatial orientation was deficient for clock draw, as pt demonstrated [facility] explanations for verbal problem solving, organization/planning, and executive function (buying carpet, and if unexpected changes occurred in the buying experience). Pt indicated her husband fills the medbox but she is independent in taking meds. She is not writing checks for bills at this time due to her writing (reportedly), she is reminding husband to pay (They are just sitting around, I'm used to paying them more quickly). On eval date she told SLP  she is not paying bills due to feeling foggy. Lastly, pt states that she writes appointments on the calendar religiously and (reportedly) knew about today's appointment when she awoke this morning. Pt's working memory/mental math/number manipulation was WNL  01/29/25: Eval completed. N/A.  PATIENT EDUCATION: Education details: SLP suspects deficits are mostly linguistic in nature, possible goals for ST Person educated: Patient Education method: Explanation Education comprehension: verbalized understanding   GOALS: Goals reviewed with patient? Yes  SHORT TERM GOALS: Target date: 02/29/24  Pt will undergo cognitive testing and complete PROM in first 2 sessions Baseline: Goal status: INITIAL  2.  Pt will use trained compensations for anomic episodes in 5 minutes simple conversation x2 sessions Baseline:  Goal status: INITIAL  3.  Pt will complete SFA and /or VNEST with occasional min A x2 sessions Baseline:  Goal status: INITIAL  4.  Pt will use slowed rate to foster more fluid speech in sentence responses 80% of the time in 2 sessions Baseline:  Goal status: INITIAL   LONG TERM GOALS: Target date: 04/04/24  Pt will improve PROM Baseline:  Goal status: INITIAL  2.  Pt  will use trained compensations for anomic episodes in 8 minutes simple conversation x2 sessions Baseline:  Goal status: INITIAL  3.  Pt will complete SFA and /or VNEST with rare min A x2 sessions Baseline:  Goal status: INITIAL  4.  Pt will use slowed rate to foster more fluid speech 80% of the time in 8 minutes of simple conversation in 2 sessions Baseline:  Goal status: INITIAL   ASSESSMENT:  CLINICAL IMPRESSION: Patient is a 78 y.o. F who was seen today for speech/language in light of seizure and . Metabolic encephalopathy with acute kidney injury. On date of eval [name] stated I can't come up with the word I was sometimes, and sometimes it comes out strangely. Pt also endorsed repetitions in her speech, with reduced speech fluidity since November admission. Lastly she told SLP that her husband is handling monthly bills, something pt used to do prior to hospitalization in September. Today SLP completed cognitive evaluation but suspects errors for clock drawing were visuospatial in nature and not cognitive as verbally, pt demonstrated good planning, organization, and executive function. No goals will be added.   OBJECTIVE IMPAIRMENTS: include attention, memory, executive functioning, and aphasia. These impairments are limiting patient from managing medications, managing finances, household responsibilities, ADLs/IADLs, and effectively communicating at home and in community. Factors affecting potential to achieve goals and functional outcome are co-morbidities. Patient will benefit from skilled SLP services to address above impairments and improve overall function.  REHAB POTENTIAL: Good  PLAN:  SLP FREQUENCY: 2x/week  SLP DURATION: 8 weeks  PLANNED INTERVENTIONS: Language facilitation, Environmental controls, Cognitive reorganization, Internal/external aids, Functional tasks, Multimodal communication approach, SLP instruction and feedback, Compensatory strategies, Patient/family  education, and [id number] Treatment of speech (30 or 45 min)     [name], CCC-SLP 02/05/2024, 8:39 AM      "

## 2024-02-05 NOTE — Telephone Encounter (Signed)
 Called pts husband he is in control of her meds and she has not been over using he just wasn't for sure if it was time because of the date

## 2024-02-06 ENCOUNTER — Ambulatory Visit

## 2024-02-08 NOTE — Therapy (Signed)
 " OUTPATIENT PHYSICAL THERAPY NEURO TREATMENT   Patient Name: [name] MRN: [id number] DOB:1945/02/17, 78 y.o., female Today's Date: 02/11/2024   PCP: Wendolyn Jenkins Jansky, MD  REFERRING PROVIDER: Maurice Sharlet RAMAN, PA-C  END OF SESSION:  PT End of Session - 02/11/24 1702     Visit Number 2    Number of Visits 17    Date for Recertification  03/26/24    Authorization Type Medicare/UMR/GEHA    Authorization - Number of Visits 60   combined   PT Start Time 1620    PT Stop Time 1658    PT Time Calculation (min) 38 min    Equipment Utilized During Treatment Gait belt    Activity Tolerance Patient tolerated treatment well    Behavior During Therapy [facility] for tasks assessed/performed           Past Medical History:  Diagnosis Date   Allergy 1980's   Sulfa drugs and penecillin   Anxiety    Cataract 2021   Corrected   Chronic kidney disease 2024   Stage 3   Depression    Headache    SINUS   Hyperlipidemia    Hypertension 2021   Osteopenia    Osteopenia    Primary localized osteoarthritis of right knee    Seizures (HCC) 10/2023   Past Surgical History:  Procedure Laterality Date   BREAST EXCISIONAL BIOPSY Right    benign cyst removal    CARPAL TUNNEL RELEASE Right 2015   Ortho in [place]   CHOLECYSTECTOMY N/A 02/08/2022   Procedure: LAPAROSCOPIC CHOLECYSTECTOMY WITH INTRAOPERATIVE CHOLANGIOGRAM;  Surgeon: Debby Hila, MD;  Location: [facility];  Service: General;  Laterality: N/A;   EYE SURGERY  12/2019   cataracts   FRACTURE SURGERY  2016   Rt.  leg   JOINT REPLACEMENT  09/2015   Right knee   LASIK     MENISCUS REPAIR Right 2011   [facility]thopedic in [facility]   TOTAL KNEE ARTHROPLASTY Right 09/20/2015   TOTAL KNEE ARTHROPLASTY Right 09/20/2015   Procedure: TOTAL KNEE ARTHROPLASTY;  Surgeon: Lamar Millman, MD;  Location: [facility];  Service: Orthopedics;  Laterality: Right;   Trigger Thumb Right 2015   Done in [place] at the same time as the carpal tunnel release    TUBAL LIGATION  1980   Patient Active Problem List   Diagnosis Date Noted   Anxiety state 12/26/2023   Debility 12/24/2023   Dizziness 12/16/2023   Seizure (HCC) 10/23/2023   Encephalopathy, metabolic 10/23/2023   AKI (acute kidney injury) 10/23/2023   Hypokalemia 10/23/2023   Seizure-like activity (HCC) 10/17/2023   Trigger finger, left ring finger 09/04/2023   DOE (dyspnea on exertion) 03/02/2023   Stage 3 chronic kidney disease (HCC) 03/02/2023   Left carpal tunnel syndrome 07/25/2022   Primary osteoarthritis of both first carpometacarpal joints 07/25/2022   Arthritis 07/04/2022   Anxiety 07/04/2022   Depression 07/04/2022   Primary osteoarthritis involving multiple joints 06/11/2022   Aortic atherosclerosis 02/05/2022   Facial lesion 09/30/2021   Bilateral hand pain 09/30/2021   Impingement syndrome, shoulder, left 02/16/2021   Impingement syndrome, shoulder, right 09/07/2020   MDD (recurrent major depressive disorder) in remission 09/02/2019   Generalized anxiety disorder 09/02/2019   Primary insomnia 08/16/2019   Post-menopausal 05/14/2017   Prediabetes 01/12/2017   Snoring 07/20/2016   Obsessive-compulsive personality trait 12/07/2015   Primary localized osteoarthritis of right knee    Essential hypertension 08/04/2015   Hyperlipidemia 08/04/2015   Osteopenia 08/04/2015  ONSET DATE: 12/16/23  REFERRING DIAG: G93.41 (ICD-10-CM) - Encephalopathy, metabolic  THERAPY DIAG:  Muscle weakness (generalized)  Other symptoms and signs involving the nervous system  Unsteadiness on feet  Other low back pain  Rationale for Evaluation and Treatment: Rehabilitation  SUBJECTIVE:                                                                                                                                                                                             SUBJECTIVE STATEMENT: Patient arrives from SLP session reporting having a bad day and feeling more  brain fog. Also reports that her back is hurting her quite a bit and this is her main complaint today. Denies rib pain.    Pt accompanied by: self  PERTINENT HISTORY: Anxiety, CKD, depression, HA, HLD, HTN, seizures, R TKA, breast lumpectomy   PAIN:  Are you having pain? Yes: NPRS scale: 5/10 Pain location: B LB Pain description: discomfort, tight Aggravating factors: prolonged standing Relieving factors: heating pad  PRECAUTIONS: Fall and Other: L 4 and 5 ribs fx  RED FLAGS: None   WEIGHT BEARING RESTRICTIONS: No  FALLS: Has patient fallen in last 6 months? Yes. Number of falls 1  LIVING ENVIRONMENT: Lives with: lives with their spouse Lives in: House/apartment Stairs: 3 steps to enter back door with handrial on R; 1 story home Has following equipment at home: Single point cane, Walker - 4 wheeled, Shower bench, bed side commode, and Grab bars  PLOF: Independent, Vocation/Vocational requirements: retired, and Leisure: reading, watching sports  PATIENT GOALS: I'd like to get rid of this tight back and the walker.  OBJECTIVE:      TODAY'S TREATMENT: 02/11/24 Activity Comments  Vitals at start of session  158/83 mmHg, 84bpm, 96% spO2   sitting pelvic tilts Cueing to reduce ROM to perform within pain-free range  prayer stretch with pball forward, then diagonals  cueing to reduce ROM to perform within pain-free range. Pt reports that feels good  LTR 20x Cues to avoid straining, move within comfortable ROM   Hooklying fig 4 stretch 30 To tolerance  Hooklying KTOS  stretch 30 To tolerance  Gait training with 4WW to assess back pain 60ft Pt reports the R hip feels more involved  Vitals  173/87 mmHg, 102 bpm, 96spO2   Instruction and practice on Diaphragmatic breathing    vitals 95% spO2, 88bpm, 141/80 mmHg        HOME EXERCISE PROGRAM Last updated: 02/11/24 Access Code: [email or web address] URL: [email or web address] Date: 02/11/2024 Prepared by: [facility] -  Outpatient  Rehab - [facility]  Exercises - Seated Flexion Stretch with Swiss  Ball  - 1 x daily - 5 x weekly - 2 sets - 10 reps - Seated Thoracic Flexion and Rotation with Swiss Ball  - 1 x daily - 5 x weekly - 2 sets - 10 reps - Supine Lower Trunk Rotation  - 1 x daily - 5 x weekly - 2 sets - 10 reps  ALSO ADDED: sitting diaphragmatic breathing handout PRN   PATIENT EDUCATION: Education details: HEP with edu to stop if pain occurs, edu on benefits of diaphragmatic breathing on her vitals and pt's nervous energy that she complains of since hospitalization. Also advised to speak to PCP about this in case this could be a medication side effect  Person educated: Patient Education method: Explanation, Demonstration, Tactile cues, Verbal cues, and Handouts Education comprehension: verbalized understanding and returned demonstration      Note: Objective measures were completed at Evaluation unless otherwise noted.  DIAGNOSTIC FINDINGS:  12/19/23 brain MRI:  Unchanged punctate focus of restricted diffusion in the right hippocampus as described on 12/16/2023 MRI.  12/17/23 MR head angio: Normal intracranial MRA. No large vessel occlusion or other vascular abnormality. No hemodynamically significant or correctable stenosis.  12/19/23 CT angio: Acute appearing mildly displaced left fourth and fifth lateral rib fractures. Small left-sided pleural effusion with dependent atelectasis in the left base.   COGNITION: Overall cognitive status: Within functional limits for tasks assessed   SENSATION: Pt denies N/T in UE/LEs  COORDINATION: Alternating pronation/supination: WNL B Alternating toe tap: WNL B Finger to nose: tremor, slightly slowed B   MUSCLE TONE: 1-2 bears clonus in B ankles  PALPATION: TTP over posterior R and L ribs, midline LB and B lumbar paraspinals. Very tight in B paraspinals and QL   POSTURE: rounded shoulders, forward head, increased thoracic  kyphosis, and R shoulder depressed Observe slight B LE tremor in standing  LOWER EXTREMITY MMT:     MMT  (in sitting) Right Eval Left Eval  Hip flexion 4- 4  Hip extension    Hip abduction 4 4  Hip adduction 4 4  Hip internal rotation    Hip external rotation    Knee flexion 4+ 4+  Knee extension 4+ 4  Ankle dorsiflexion 4+ 4+  Ankle plantarflexion 4+ 4+  Ankle inversion    Ankle eversion     (Blank rows = not tested)  LOWER EXTREMITY ROM:    AROM Right Eval Left Eval  Hip flexion    Hip extension    Hip abduction    Hip adduction    Hip internal rotation    Hip external rotation    Knee flexion    Knee extension    Ankle dorsiflexion 29 15  Ankle plantarflexion    Ankle inversion    Ankle eversion    (Blank rows = not tested)  GAIT: Findings: Assistive device utilized:Walker - 4 wheeled, Level of assistance: Modified independence, and Comments: difficulty maneuvering in tight spaces with walker  FUNCTIONAL TESTS:  5 times sit to stand: 13.65 sec with B armrests Berg: 39/56   [facility] PT Assessment - 01/30/24 0001       Standardized Balance Assessment   Standardized Balance Assessment Berg Balance Test      Berg Balance Test   Sit to Stand Able to stand without using hands and stabilize independently    Standing Unsupported Able to stand 2 minutes with supervision    Sitting with Back Unsupported but Feet Supported on Floor or Stool Able to sit safely and securely  2 minutes    Stand to Sit Sits safely with minimal use of hands    Transfers Able to transfer safely, minor use of hands    Standing Unsupported with Eyes Closed Able to stand 10 seconds with supervision    Standing Unsupported with Feet Together Needs help to attain position but able to stand for 30 seconds with feet together    From Standing, Reach Forward with Outstretched Arm Can reach forward >12 cm safely (5)    From Standing Position, Pick up Object from Floor Able to pick up shoe, needs  supervision    From Standing Position, Turn to Look Behind Over each Shoulder Looks behind one side only/other side shows less weight shift    Turn 360 Degrees Able to turn 360 degrees safely one side only in 4 seconds or less    Standing Unsupported, Alternately Place Feet on Step/Stool Able to complete >2 steps/needs minimal assist    Standing Unsupported, One Foot in Front Able to take small step independently and hold 30 seconds    Standing on One Leg Tries to lift leg/unable to hold 3 seconds but remains standing independently    Total Score 39    Berg comment: pt required 2 sit breaks                                                                                                                                      TREATMENT DATE: 01/30/24    PATIENT EDUCATION: Education details: prognosis, POC, edu on exam findings and how they relate to functional impairments, pt's goals  Person educated: Patient Education method: Explanation Education comprehension: verbalized understanding  HOME EXERCISE PROGRAM: Not yet initiated  GOALS: Goals reviewed with patient? Yes  SHORT TERM GOALS: Target date: 02/27/2024  Patient to be independent with initial HEP. Baseline: HEP initiated Goal status: IN PROGRESS    LONG TERM GOALS: Target date: 03/26/2024  Patient to be independent with advanced HEP. Baseline: Not yet initiated  Goal status: IN PROGRESS  Patient to demonstrate B LE strength >/=4+/5.  Baseline: See above Goal status: IN PROGRESS  6 minute walk goal to be created.  Baseline: - Goal status: IN PROGRESS  Patient to ambulate 220ft without AD with good safety and stability.  Baseline: using 4WW Goal status: IN PROGRESS  Patient to demonstrate 5xSTS test in <15 sec without UE support in order to decrease risk of falls.  Baseline: 13 sec with B armrests Goal status: IN PROGRESS  Patient to score at least 45/56 on Berg in order to decrease risk of falls.  Baseline:  39 Goal status: IN PROGRESS  Patient to report tolerance for 30 minutes of chores without rest break without fatigue limiting.  Baseline: 5-7 minutes  Goal status: IN PROGRESS  ASSESSMENT:  CLINICAL IMPRESSION:  Patient arrived to session with report of increased brain fog today as well as increased LB discomfort and  tightness. Vitals revealed somewhat elevated BP, thus proceeded with session while monitoring vitals and symptoms. Session focused on moving within gentle, pain-free ROM to address LB discomfort. Patient requires cues to avoid pushing into pain. BP was further elevated after activity, but patient responded well to diaphragmatic breathing. Patient tolerated session well and without complaints at end of appointment.    OBJECTIVE IMPAIRMENTS: Abnormal gait, decreased activity tolerance, decreased balance, decreased coordination, decreased endurance, decreased mobility, difficulty walking, decreased strength, increased muscle spasms, postural dysfunction, and pain.   ACTIVITY LIMITATIONS: carrying, lifting, bending, sitting, standing, squatting, stairs, transfers, bathing, toileting, dressing, reach over head, hygiene/grooming, and locomotion level  PARTICIPATION LIMITATIONS: meal prep, cleaning, laundry, shopping, community activity, and church  PERSONAL FACTORS: Age, Fitness, Past/current experiences, Time since onset of injury/illness/exacerbation, and 3+ comorbidities: Anxiety, CKD, depression, HA, HLD, HTN, seizures, R TKA, breast lumpectomy  are also affecting patient's functional outcome.   REHAB POTENTIAL: Good  CLINICAL DECISION MAKING: Evolving/moderate complexity  EVALUATION COMPLEXITY: Moderate  PLAN:  PT FREQUENCY: 2x/week  PT DURATION: 8 weeks  PLANNED INTERVENTIONS: 97164- PT Re-evaluation, 97750- Physical Performance Testing, 97110-Therapeutic exercises, 97530- Therapeutic activity, 97112- Neuromuscular re-education, 97535- Self Care, [id number]- Manual therapy,  [phone]- Gait training, [phone]- Canalith repositioning, [phone]- Aquatic Therapy, [phone] (1-2 muscles), [phone] (3+ muscles)- Dry Needling, Patient/Family education, Balance training, Stair training, Taping, Joint mobilization, Spinal mobilization, Vestibular training, DME instructions, Cryotherapy, and Moist heat  PLAN FOR NEXT SESSION: 6 min walk and create goal; initiate HEP with standing balance, hip strength, gentle lumbar mobility while being mindful of L ribs fx     Louana Terrilyn Christians, PT, DPT 02/11/2024 5:10 PM  [facility] [address], [address] [place], [place] [address] Phone # [phone] Fax # [phone]        "

## 2024-02-11 ENCOUNTER — Ambulatory Visit: Payer: Self-pay | Admitting: Gastroenterology

## 2024-02-11 ENCOUNTER — Other Ambulatory Visit: Payer: Self-pay

## 2024-02-11 ENCOUNTER — Ambulatory Visit

## 2024-02-11 ENCOUNTER — Other Ambulatory Visit (INDEPENDENT_AMBULATORY_CARE_PROVIDER_SITE_OTHER)

## 2024-02-11 ENCOUNTER — Encounter: Payer: Self-pay | Admitting: Physical Therapy

## 2024-02-11 ENCOUNTER — Other Ambulatory Visit (HOSPITAL_COMMUNITY): Payer: Self-pay

## 2024-02-11 ENCOUNTER — Ambulatory Visit: Admitting: Physical Therapy

## 2024-02-11 ENCOUNTER — Other Ambulatory Visit: Payer: Self-pay | Admitting: Family Medicine

## 2024-02-11 DIAGNOSIS — N183 Chronic kidney disease, stage 3 unspecified: Secondary | ICD-10-CM

## 2024-02-11 DIAGNOSIS — M5459 Other low back pain: Secondary | ICD-10-CM

## 2024-02-11 DIAGNOSIS — R41841 Cognitive communication deficit: Secondary | ICD-10-CM

## 2024-02-11 DIAGNOSIS — M6281 Muscle weakness (generalized): Secondary | ICD-10-CM | POA: Diagnosis not present

## 2024-02-11 DIAGNOSIS — R29818 Other symptoms and signs involving the nervous system: Secondary | ICD-10-CM

## 2024-02-11 DIAGNOSIS — R4701 Aphasia: Secondary | ICD-10-CM

## 2024-02-11 DIAGNOSIS — R2681 Unsteadiness on feet: Secondary | ICD-10-CM

## 2024-02-11 DIAGNOSIS — D509 Iron deficiency anemia, unspecified: Secondary | ICD-10-CM | POA: Diagnosis not present

## 2024-02-11 LAB — CBC WITH DIFFERENTIAL/PLATELET
Basophils Absolute: 0.1 K/uL (ref 0.0–0.1)
Basophils Relative: 0.9 % (ref 0.0–3.0)
Eosinophils Absolute: 0.1 K/uL (ref 0.0–0.7)
Eosinophils Relative: 1 % (ref 0.0–5.0)
HCT: 35.7 % — ABNORMAL LOW (ref 36.0–46.0)
Hemoglobin: 11.7 g/dL — ABNORMAL LOW (ref 12.0–15.0)
Lymphocytes Relative: 22.6 % (ref 12.0–46.0)
Lymphs Abs: 1.9 K/uL (ref 0.7–4.0)
MCHC: 32.7 g/dL (ref 30.0–36.0)
MCV: 86.1 fl (ref 78.0–100.0)
Monocytes Absolute: 0.6 K/uL (ref 0.1–1.0)
Monocytes Relative: 6.9 % (ref 3.0–12.0)
Neutro Abs: 5.7 K/uL (ref 1.4–7.7)
Neutrophils Relative %: 68.6 % (ref 43.0–77.0)
Platelets: 502 K/uL — ABNORMAL HIGH (ref 150.0–400.0)
RBC: 4.14 Mil/uL (ref 3.87–5.11)
RDW: 16.8 % — ABNORMAL HIGH (ref 11.5–15.5)
WBC: 8.4 K/uL (ref 4.0–10.5)

## 2024-02-11 LAB — B12 AND FOLATE PANEL
Folate: 15 ng/mL
Vitamin B-12: >1500 pg/mL — ABNORMAL HIGH (ref 211–911)

## 2024-02-11 LAB — FERRITIN: Ferritin: 20.2 ng/mL (ref 10.0–291.0)

## 2024-02-11 LAB — IRON: Iron: 50 ug/dL (ref 42–145)

## 2024-02-11 MED ORDER — POTASSIUM CHLORIDE CRYS ER 10 MEQ PO TBCR
10.0000 meq | EXTENDED_RELEASE_TABLET | Freq: Every day | ORAL | 0 refills | Status: DC
  Filled 2024-02-11: qty 30, 30d supply, fill #0

## 2024-02-11 NOTE — Patient Instructions (Signed)
" °  Speech Exercises  Repeat these phrases 2 times, 2 times a day  Call the cat Buttercup A calendar of [place], [place] Four floors to cover Yellow oil ointment Fellow lovers of felines Catastrophe in [place] Plump plumbers plums The churchs chimes chimed Telling time 'til eleven Five valve levers Keep the gate closed Go see that guy Fat cows give milk [place]  [employer] Fat frogs flip freely [employer] into bed Get that game to [employer] Thick thistles stick together Cinnamon aluminum linoleum Black bugs blood Lovely lemon linament Red leather, yellow leather  Big grocery buggy    Purple baby carriage [facility] [place] copper coffee pot Ripe purple cabbage Three free throws [employer] tackled  [employer] [place]  Deatrice dipped the dessert  [employer] Buckle that [employer] [place] Shirts shrink, shells shouldnt [place] 49ers Take the tackle box File the flash message Give me five flapjacks Fundamental relatives Dye the pets purple Talking turkey time after time Dark chocolate chunks Political landscape of the kingdom Actuary genius  "

## 2024-02-11 NOTE — Therapy (Signed)
 " OUTPATIENT SPEECH LANGUAGE PATHOLOGY APHASIA TREATMENT   Patient Name: [name] MRN: [id number] DOB:09-29-1945, 78 y.o., female Today's Date: 02/11/2024  PCP: Wendolyn Jenkins Jansky, MD REFERRING PROVIDER: Gregg Lek, MD  END OF SESSION:  End of Session - 02/11/24 1531     Visit Number 3    Number of Visits 17    Date for Recertification  04/04/24    SLP Start Time 1534    SLP Stop Time  1615    SLP Time Calculation (min) 41 min    Activity Tolerance Patient tolerated treatment well            Past Medical History:  Diagnosis Date   Allergy 1980's   Sulfa drugs and penecillin   Anxiety    Cataract 2021   Corrected   Chronic kidney disease 2024   Stage 3   Depression    Headache    SINUS   Hyperlipidemia    Hypertension 2021   Osteopenia    Osteopenia    Primary localized osteoarthritis of right knee    Seizures (HCC) 10/2023   Past Surgical History:  Procedure Laterality Date   BREAST EXCISIONAL BIOPSY Right    benign cyst removal    CARPAL TUNNEL RELEASE Right 2015   Ortho in [place]   CHOLECYSTECTOMY N/A 02/08/2022   Procedure: LAPAROSCOPIC CHOLECYSTECTOMY WITH INTRAOPERATIVE CHOLANGIOGRAM;  Surgeon: Debby Hila, MD;  Location: [facility];  Service: General;  Laterality: N/A;   EYE SURGERY  12/2019   cataracts   FRACTURE SURGERY  2016   Rt.  leg   JOINT REPLACEMENT  09/2015   Right knee   LASIK     MENISCUS REPAIR Right 2011   [facility]thopedic in [facility]   TOTAL KNEE ARTHROPLASTY Right 09/20/2015   TOTAL KNEE ARTHROPLASTY Right 09/20/2015   Procedure: TOTAL KNEE ARTHROPLASTY;  Surgeon: Lamar Millman, MD;  Location: [facility] OR;  Service: Orthopedics;  Laterality: Right;   Trigger Thumb Right 2015   Done in [place] at the same time as the carpal tunnel release   TUBAL LIGATION  1980   Patient Active Problem List   Diagnosis Date Noted   Anxiety state 12/26/2023   Debility 12/24/2023   Dizziness 12/16/2023   Seizure (HCC) 10/23/2023    Encephalopathy, metabolic 10/23/2023   AKI (acute kidney injury) 10/23/2023   Hypokalemia 10/23/2023   Seizure-like activity (HCC) 10/17/2023   Trigger finger, left ring finger 09/04/2023   DOE (dyspnea on exertion) 03/02/2023   Stage 3 chronic kidney disease (HCC) 03/02/2023   Left carpal tunnel syndrome 07/25/2022   Primary osteoarthritis of both first carpometacarpal joints 07/25/2022   Arthritis 07/04/2022   Anxiety 07/04/2022   Depression 07/04/2022   Primary osteoarthritis involving multiple joints 06/11/2022   Aortic atherosclerosis 02/05/2022   Facial lesion 09/30/2021   Bilateral hand pain 09/30/2021   Impingement syndrome, shoulder, left 02/16/2021   Impingement syndrome, shoulder, right 09/07/2020   MDD (recurrent major depressive disorder) in remission 09/02/2019   Generalized anxiety disorder 09/02/2019   Primary insomnia 08/16/2019   Post-menopausal 05/14/2017   Prediabetes 01/12/2017   Snoring 07/20/2016   Obsessive-compulsive personality trait 12/07/2015   Primary localized osteoarthritis of right knee    Essential hypertension 08/04/2015   Hyperlipidemia 08/04/2015   Osteopenia 08/04/2015    ONSET DATE: 12/16/23   REFERRING DIAG: R47.81 (ICD-10-CM) - Slurred speech R47.89 (ICD-10-CM) - Word finding difficulty  THERAPY DIAG:  Aphasia  Cognitive communication deficit  Rationale for Evaluation and Treatment: Rehabilitation  SUBJECTIVE:   SUBJECTIVE STATEMENT: I'm just out of it more today. I didn't sleep so well last night - maybe that's a part of it.  Pt accompanied by: self  PERTINENT HISTORY: PMH of HTN, HLD, osteopenia, seizures, and CKD. She was hospitalized in September with AMS. She presented to [facility] on 12/16/23 with falls and slurred speech. She had hit her head and reported nausea and diarrhea. CT head was negative for acute abnormalities. CXR revealed mild cardiomegaly with no acute infiltrates. C-spine, right knee, right rib, and left shoulder  Xrs have been negative. UA was WNL. MRI showed possible punctate stroke MRA negative for LVO. LTM EEG on 11/4 is negative for seizures. LTM EEG on 11/5 was also negative for seizures. Patient is being admitted to CIR for impaired mobility and ADLs.   PAIN:  Are you having pain? Yes: NPRS scale: 2/10 Pain location: lower back Pain description: it's so tight, dull Aggravating factors: walking or standing Relieving factors: heat  FALLS: Has patient fallen in last 6 months?  See PT evaluation for details   PATIENT GOALS: Get rid of this tremor. Get my talking back to normal  OBJECTIVE:  Note: Objective measures were completed at Evaluation unless otherwise noted.  DIAGNOSTIC FINDINGS:  SLE 02/23/23: Cognitive/ Linguistic. Pt presents with a mild expressive language deficit. Portions of the Western Aphasia Battery- Bedside Form completed revealing strengths in naming, auditory comprehension, and repetition. Her fluency was described as; some hesitations and word finding difficulty. Pt confirms changes to language upon hospitalization however, reports symptoms have largely resolved and she is back to baseline. Informally, some instances of phonemic paraphasias noted with pt able to self correct consistently. She also demonstrated some mild cluttering though it did not impact communication. Patient's overall cognitive functioning was not formally assessed but appeared [facility] during functional tasks. Patient also endorses that she feels she is at her cognitive baseline. She was oriented x4 and verbalized recent medical hx in detail. No follow up intervention warranted at this time.    PATIENT REPORTED OUTCOME MEASURES (PROM): Communication Effectiveness Survey: provided in first 1-2 sessions.                                                                                                                             TREATMENT DATE:  SFA-Semantic Feature Analysis  02/11/24: Pt needs VNEST next  session. I think I've gotten pretty good at halfway through the sentence finding a different word to use. SLP educated pt about SFA today and pt noted to rush through the task and appear to ignore SLP cues. Attention appeared min varying to moderately worse today than prior sessions. Pt made articulatory errors x5 today - more repetitious in nature than aphasic. She asked at end of session, Is there something I can do to work on this 'blahblhblhblah (pt imitation of her articulatory errors today)? SLP provided consonant-laden phrases and modeled slow and more deliberate articulation with these sentences for pt.  She return demonstrated very well.   02/04/24: Pt with one phonemic paraphasia in today's session. SLUMS today. Stevey scored 24/30 however SLP wonders if pt's visual acuity/spatial orientation was deficient for clock draw, as pt demonstrated [facility] explanations for verbal problem solving, organization/planning, and executive function (buying carpet, and if unexpected changes occurred in the buying experience). Pt indicated her husband fills the medbox but she is independent in taking meds. She is not writing checks for bills at this time due to her writing (reportedly), she is reminding husband to pay (They are just sitting around, I'm used to paying them more quickly). On eval date she told SLP she is not paying bills due to feeling foggy. Lastly, pt states that she writes appointments on the calendar religiously and (reportedly) knew about today's appointment when she awoke this morning. Pt's working memory/mental math/number manipulation was WNL  01/29/25: Eval completed. N/A.  PATIENT EDUCATION: Education details: SLP suspects deficits are mostly linguistic in nature, possible goals for ST Person educated: Patient Education method: Explanation Education comprehension: verbalized understanding   GOALS: Goals reviewed with patient? Yes  SHORT TERM GOALS: Target date: 02/29/24  Pt  will undergo cognitive testing and complete PROM in first 2 sessions Baseline: Goal status: met  2.  Pt will use trained compensations for anomic episodes in 5 minutes simple conversation x2 sessions Baseline:  Goal status: INITIAL  3.  Pt will complete SFA and /or VNEST with occasional min A x2 sessions Baseline:  Goal status: INITIAL  4.  Pt will use slowed rate to foster more fluid speech in sentence responses 80% of the time in 2 sessions Baseline:  Goal status: INITIAL   LONG TERM GOALS: Target date: 04/04/24  Pt will improve PROM Baseline:  Goal status: INITIAL  2.  Pt will use trained compensations for anomic episodes in 8 minutes simple conversation x2 sessions Baseline:  Goal status: INITIAL  3.  Pt will complete SFA and /or VNEST with rare min A x2 sessions Baseline:  Goal status: INITIAL  4.  Pt will use slowed rate to foster more fluid speech 80% of the time in 8 minutes of simple conversation in 2 sessions Baseline:  Goal status: INITIAL   ASSESSMENT:  CLINICAL IMPRESSION: Patient is a 78 y.o. F who was seen today for speech/language in light of seizure and . metabolic encephalopathy with acute kidney injury. See treatment date above for today's date for further details on today's session. On date of eval Seryna stated I can't come up with the word I was sometimes, and sometimes it comes out strangely. Pt also endorsed repetitions in her speech, with reduced speech fluidity since November admission. Lastly she told SLP that her husband is handling monthly bills, something pt used to do prior to hospitalization in September. Today SLP completed cognitive evaluation but suspects errors for clock drawing were visuospatial in nature and not cognitive as verbally, pt demonstrated good planning, organization, and executive function. No goals will be added.   OBJECTIVE IMPAIRMENTS: include attention, memory, executive functioning, and aphasia. These impairments are  limiting patient from managing medications, managing finances, household responsibilities, ADLs/IADLs, and effectively communicating at home and in community. Factors affecting potential to achieve goals and functional outcome are co-morbidities. Patient will benefit from skilled SLP services to address above impairments and improve overall function.  REHAB POTENTIAL: Good  PLAN:  SLP FREQUENCY: 2x/week  SLP DURATION: 8 weeks  PLANNED INTERVENTIONS: Language facilitation, Environmental controls, Cognitive reorganization, Internal/external aids, Functional tasks, Multimodal  communication approach, SLP instruction and feedback, Compensatory strategies, Patient/family education, and [id number] Treatment of speech (30 or 45 min)     Espn Zeman, CCC-SLP 02/11/2024, 3:31 PM      "

## 2024-02-12 ENCOUNTER — Other Ambulatory Visit: Payer: Self-pay

## 2024-02-12 ENCOUNTER — Encounter: Payer: Self-pay | Admitting: Pharmacist

## 2024-02-12 ENCOUNTER — Other Ambulatory Visit (HOSPITAL_COMMUNITY): Payer: Self-pay

## 2024-02-13 ENCOUNTER — Encounter: Payer: Self-pay | Admitting: Physical Therapy

## 2024-02-13 ENCOUNTER — Ambulatory Visit

## 2024-02-13 ENCOUNTER — Ambulatory Visit: Admitting: Occupational Therapy

## 2024-02-13 ENCOUNTER — Ambulatory Visit: Admitting: Physical Therapy

## 2024-02-13 ENCOUNTER — Other Ambulatory Visit: Payer: Self-pay | Admitting: Family Medicine

## 2024-02-13 ENCOUNTER — Other Ambulatory Visit (HOSPITAL_COMMUNITY): Payer: Self-pay

## 2024-02-13 DIAGNOSIS — R4701 Aphasia: Secondary | ICD-10-CM

## 2024-02-13 DIAGNOSIS — R41841 Cognitive communication deficit: Secondary | ICD-10-CM

## 2024-02-13 DIAGNOSIS — R278 Other lack of coordination: Secondary | ICD-10-CM

## 2024-02-13 DIAGNOSIS — M5459 Other low back pain: Secondary | ICD-10-CM

## 2024-02-13 DIAGNOSIS — R29898 Other symptoms and signs involving the musculoskeletal system: Secondary | ICD-10-CM

## 2024-02-13 DIAGNOSIS — R29818 Other symptoms and signs involving the nervous system: Secondary | ICD-10-CM

## 2024-02-13 DIAGNOSIS — M6281 Muscle weakness (generalized): Secondary | ICD-10-CM | POA: Diagnosis not present

## 2024-02-13 DIAGNOSIS — R2681 Unsteadiness on feet: Secondary | ICD-10-CM

## 2024-02-13 DIAGNOSIS — R251 Tremor, unspecified: Secondary | ICD-10-CM

## 2024-02-13 MED ORDER — ZOLPIDEM TARTRATE 5 MG PO TABS
5.0000 mg | ORAL_TABLET | Freq: Every evening | ORAL | 0 refills | Status: DC | PRN
  Filled 2024-02-13: qty 30, 30d supply, fill #0

## 2024-02-13 NOTE — Therapy (Signed)
 " OUTPATIENT OCCUPATIONAL THERAPY NEURO Treatment Session  Patient Name: [name] MRN: [id number] DOB:07-14-1945, 78 y.o., female Today's Date: 02/13/2024  PCP: Wendolyn Jenkins Jansky, MD REFERRING PROVIDER: Maurice Sharlet RAMAN, PA-C  END OF SESSION:  OT End of Session - 02/13/24 1219     Visit Number 2    Number of Visits 9    Date for Recertification  04/29/24    Authorization Type MCR A&B, visit limit PT/OT/ST combined 60    OT Start Time 0930    OT Stop Time 1013    OT Time Calculation (min) 43 min    Activity Tolerance Patient tolerated treatment well    Behavior During Therapy [facility] for tasks assessed/performed           Past Medical History:  Diagnosis Date   Allergy 1980's   Sulfa drugs and penecillin   Anxiety    Cataract 2021   Corrected   Chronic kidney disease 2024   Stage 3   Depression    Headache    SINUS   Hyperlipidemia    Hypertension 2021   Osteopenia    Osteopenia    Primary localized osteoarthritis of right knee    Seizures (HCC) 10/2023   Past Surgical History:  Procedure Laterality Date   BREAST EXCISIONAL BIOPSY Right    benign cyst removal    CARPAL TUNNEL RELEASE Right 2015   Ortho in [place]   CHOLECYSTECTOMY N/A 02/08/2022   Procedure: LAPAROSCOPIC CHOLECYSTECTOMY WITH INTRAOPERATIVE CHOLANGIOGRAM;  Surgeon: Debby Hila, MD;  Location: [facility] ORS;  Service: General;  Laterality: N/A;   EYE SURGERY  12/2019   cataracts   FRACTURE SURGERY  2016   Rt.  leg   JOINT REPLACEMENT  09/2015   Right knee   LASIK     MENISCUS REPAIR Right 2011   Orthopedic in [facility]   TOTAL KNEE ARTHROPLASTY Right 09/20/2015   TOTAL KNEE ARTHROPLASTY Right 09/20/2015   Procedure: TOTAL KNEE ARTHROPLASTY;  Surgeon: Lamar Millman, MD;  Location: [facility] OR;  Service: Orthopedics;  Laterality: Right;   Trigger Thumb Right 2015   Done in [place] at the same time as the carpal tunnel release   TUBAL LIGATION  1980   Patient Active Problem List   Diagnosis  Date Noted   Anxiety state 12/26/2023   Debility 12/24/2023   Dizziness 12/16/2023   Seizure (HCC) 10/23/2023   Encephalopathy, metabolic 10/23/2023   AKI (acute kidney injury) 10/23/2023   Hypokalemia 10/23/2023   Seizure-like activity (HCC) 10/17/2023   Trigger finger, left ring finger 09/04/2023   DOE (dyspnea on exertion) 03/02/2023   Stage 3 chronic kidney disease (HCC) 03/02/2023   Left carpal tunnel syndrome 07/25/2022   Primary osteoarthritis of both first carpometacarpal joints 07/25/2022   Arthritis 07/04/2022   Anxiety 07/04/2022   Depression 07/04/2022   Primary osteoarthritis involving multiple joints 06/11/2022   Aortic atherosclerosis 02/05/2022   Facial lesion 09/30/2021   Bilateral hand pain 09/30/2021   Impingement syndrome, shoulder, left 02/16/2021   Impingement syndrome, shoulder, right 09/07/2020   MDD (recurrent major depressive disorder) in remission 09/02/2019   Generalized anxiety disorder 09/02/2019   Primary insomnia 08/16/2019   Post-menopausal 05/14/2017   Prediabetes 01/12/2017   Snoring 07/20/2016   Obsessive-compulsive personality trait 12/07/2015   Primary localized osteoarthritis of right knee    Essential hypertension 08/04/2015   Hyperlipidemia 08/04/2015   Osteopenia 08/04/2015    ONSET DATE: 12/25/2023  REFERRING DIAG: G93.41 (ICD-10-CM) - Encephalopathy, metabolic  THERAPY DIAG:  Muscle weakness (generalized)  Other symptoms and signs involving the nervous system  Other lack of coordination  Tremor  Other symptoms and signs involving the musculoskeletal system  Rationale for Evaluation and Treatment: Rehabilitation  SUBJECTIVE:   SUBJECTIVE STATEMENT: Pt states her tremors are limiting - pt believes the R is worse than L. Pt reports she is also trying to remember to lock her rollator brakes more often when getting up/down.  Pt accompanied by: self  PERTINENT HISTORY: Pt hit head during fall on 10/31. MR showed punctate  3 mm focus of restricted diffusion involving the mesial right temporal lobe/hippocampal formation. Primary differential considerations include a punctate acute ischemic nonhemorrhagic infarct versus changes of transient global amnesia. Performing EEG to rule out seizure. Was recently admitted 10/2023 for seizure-like activity. MRI with no evidence of acute intracranial abnormality. CT chest 11/5 revealed acute non-displaced L 4-5 lateral rib fractures. PMH positive for HTN, GERD, MDD, GAD, insomina, CVA.  PRECAUTIONS: Other: seizures hx, no electrical modalities  WEIGHT BEARING RESTRICTIONS: No  PAIN:  Are you having pain? Yes: NPRS scale: 3-4/10 Pain location: lower back Pain description: aching Aggravating factors: twisting, standing prolonged Relieving factors: Tylenol   FALLS: Has patient fallen in last 6 months? Yes. Number of falls 1, fractured ribs  LIVING ENVIRONMENT: Lives with: lives with their spouse Lives in: House/apartment 1 level Stairs: Yes: External: 3 steps; on right going up Has following equipment at home: Walker - 4 wheeled, Grab bars, and walk in shower   PLOF: Independent, Independent with household mobility without device, and Independent with community mobility without device  PATIENT GOALS: I was sewing before but my coordination is not what it used to be since the shakes, I needed to hem some of my pants. I want to improve the shakes in my hands too, I have trouble getting dressed and sometimes feeding myself, it gets worse when I get tired or stressed.  OBJECTIVE:  Note: Objective measures were completed at Evaluation unless otherwise noted.  HAND DOMINANCE: Right  ADLs: Overall ADLs: Independent but does take longer Transfers/ambulation related to ADLs: Eating: Independent with increased time Grooming: Independent with increased time UB Dressing: Independent with increased time LB Dressing: Independent with increased time Toileting:  Independent Bathing: Mod I Tub Shower transfers: Mod I Equipment: Shower seat with back, Grab bars, and Walk in shower  IADLs: Shopping: Husband has been completing more since hospitalization Light housekeeping: Has hired help  Meal Prep: Has done a little, husband  Community mobility: Not allowed to drive for 6 months since d/c Medication management: husband has been setting up pill organizer since hospitalization Financial management: Husband has been completing since hospitalization Handwriting: 75% legible and tremors since hospitalization  MOBILITY STATUS: Needs Assist: utilizes rollator and Hx of falls  POSTURE COMMENTS:  No Significant postural limitations Sitting balance: WNL  ACTIVITY TOLERANCE: Activity tolerance: lower back hurts affecting ability to stand prolonged   FUNCTIONAL OUTCOME MEASURES: PSFS: 4.3 average score  UPPER EXTREMITY ROM:  WNL  Active ROM Right eval Left eval  Shoulder flexion    Shoulder abduction    Shoulder adduction    Shoulder extension    Shoulder internal rotation    Shoulder external rotation    Elbow flexion    Elbow extension    Wrist flexion    Wrist extension    Wrist ulnar deviation    Wrist radial deviation    Wrist pronation    Wrist supination    (Blank rows = not  tested)  UPPER EXTREMITY MMT:     MMT Right eval Left eval  Shoulder flexion    Shoulder abduction    Shoulder adduction    Shoulder extension    Shoulder internal rotation    Shoulder external rotation    Middle trapezius    Lower trapezius    Elbow flexion    Elbow extension    Wrist flexion    Wrist extension    Wrist ulnar deviation    Wrist radial deviation    Wrist pronation    Wrist supination    (Blank rows = not tested)  HAND FUNCTION: Grip strength: Right: 24 lbs; Left: 28 lbs  COORDINATION: 9 Hole Peg test: Right: 31.09 sec; Left: 40.35 sec Box and Blocks:  Right 38 blocks, Left 35 blocks Tremors: Resting and action Pt did  hit divider a couple times d/t tremors, min cues for proper form d/t pt bringing block around divider  PPT 2 (self feeding): 16.81 seconds  Button/unbutton 3 buttons: 24.09 seconds SENSATION: WFL  EDEMA: none  MUSCLE TONE: RUE: Within functional limits and LUE: Within functional limits  COGNITION: Overall cognitive status: Within functional limits for tasks assessed  VISION: Subjective report: Sometimes I feel like my vision is a little different but I went to the optometrist last week and he said I didn't even need to change my prescription. Baseline vision: Wears glasses for reading only Visual history: cataracts  VISION ASSESSMENT: To be further assessed in functional context  Patient has difficulty with following activities due to following visual impairments: n/a  PERCEPTION: WFL  PRAXIS: Impaired: Motor planning  OBSERVATIONS: tremors and weakness in grip strength                                                                                                                             TREATMENT DATE:   02/13/24 Standing balance/rollator training: pt stating she sometimes tries to walk without rollator in home and educated on keeping with her for fall prevention. Rollator training for carryover to ADL/IADL with weaving through 6/6 cones with no hits, as well as training for parking and locking/unlocking brakes for seated recovery breaks for IADLs at home such as cooking, going to the grocery store, etc.  Pt performing FMC/grip strengthening task for reaching and placing clips colors yellow - black (significant difficulty with color black and having to modify) focusing on R hand only.  Note pt with tremors fluctuating throughout session - gently asking about potential anxiety and pt stating she does have anxiety. Educated as well for techniques to reduce tremors when eating/performing detailed movements for weight bearing and positioning at table.      PATIENT  EDUCATION: Education details: purpose of OT, tremor compensation strategies Person educated: Patient Education method: [employer] Technology [employer] Education comprehension: verbalized understanding and needs further education  HOME EXERCISE PROGRAM:    GOALS: Goals reviewed with patient? Yes  SHORT TERM GOALS: Target date: 03/02/23  Patient  will demonstrate HEP for B grip and coordination with 25% verbal cues or less for proper execution.  Baseline: Goal status: INITIAL  2.  Patient will independently recall at least 3 energy conservation principles in relation to ADLs to increase functional independence.  Baseline:  Goal status: INITIAL  3.  Pt will verbalize understanding of adaptive strategies to increase ease with ADLS/IADLS   Baseline:  Goal status: INITIAL  4.  Patient will demo improved FM coordination as evidenced by completing nine-hole peg with use of L hand in 5 seconds or less.  Baseline:  Goal status: INITIAL   LONG TERM GOALS: Target date: 04/29/24  Patient will report at least two-point increase in average PSFS score or at least three-point increase in a single activity score indicating functionally significant improvement given minimum detectable change.  Baseline: 4.3 total score (See above for individual activity scores)   Goal status: INITIAL  2.  Patient will demonstrate at least 30 lbs R grip strength as needed to open jars and other containers.  Baseline:  Right: 24 lbs; Left: 28 lbs Goal status: INITIAL  3.  Pt will demonstrate improved ease with fastening buttons as evidenced by decreasing 3 button/unbutton time by 4 seconds  Baseline: Button/unbutton 3 buttons: 24.09 seconds Goal status: INITIAL  4.  Pt will demonstrate improved ease with feeding as evidenced by decreasing PPT#2 by 3 secs  Baseline: PPT 2 (self feeding): 16.81 seconds  Goal status: INITIAL  5.  Pt will complete simulated med mgmt with 100% accuracy and no more than min verbal  cues  Baseline:  Goal status: INITIAL    ASSESSMENT:  CLINICAL IMPRESSION: Patient is a 78 y.o. female who was seen today for occupational therapy treatment session for s/p hospitalization for metabolic encephalopathy. Pt reporting as well MD unsure if seizure vs CVA activity as well. Hx includes OA of R knee, falls, seizures, CKD Stage III. Patient currently presents slightly below baseline level of functioning demonstrating functional deficits and impairments as noted below. Pt would benefit from skilled OT services in the outpatient setting to work on impairments as noted below to help pt return to PLOF as able.     PERFORMANCE DEFICITS: in functional skills including ADLs, IADLs, coordination, dexterity, strength, Fine motor control, and endurance, cognitive skills including safety awareness, and psychosocial skills including coping strategies, environmental adaptation, and routines and behaviors.   IMPAIRMENTS: are limiting patient from ADLs, IADLs, and leisure.   CO-MORBIDITIES: may have co-morbidities  that affects occupational performance. Patient will benefit from skilled OT to address above impairments and improve overall function.  MODIFICATION OR ASSISTANCE TO COMPLETE EVALUATION: No modification of tasks or assist necessary to complete an evaluation.  OT OCCUPATIONAL PROFILE AND HISTORY: Problem focused assessment: Including review of records relating to presenting problem.  CLINICAL DECISION MAKING: LOW - limited treatment options, no task modification necessary  REHAB POTENTIAL: Good  EVALUATION COMPLEXITY: Low    PLAN:  OT FREQUENCY: 1x/week  OT DURATION: 8 weeks  PLANNED INTERVENTIONS: 97168 OT Re-evaluation, 97535 self care/ADL training, [id number] therapeutic exercise, 97530 therapeutic activity, 97112 neuromuscular re-education, psychosocial skills training, energy conservation, coping strategies training, patient/family education, and DME and/or AE  instructions  RECOMMENDED OTHER SERVICES: none, receiving PT/SLP evals  CONSULTED AND AGREED WITH PLAN OF CARE: Patient  PLAN FOR NEXT SESSION:  AE education (button hook, weighted pen, weighted utensils) Coordination/putty HEP Energy conservation/locking rollator for seated recovery breaks   Chiquita JAYSON Hopping, OT 02/13/2024, 12:25 PM           "

## 2024-02-13 NOTE — Therapy (Signed)
 " OUTPATIENT PHYSICAL THERAPY NEURO TREATMENT   Patient Name: [name] MRN: [id number] DOB:1945/12/16, 78 y.o., female Today's Date: 02/13/2024   PCP: Wendolyn Jenkins Jansky, MD  REFERRING PROVIDER: Maurice Sharlet RAMAN, PA-C  END OF SESSION:  PT End of Session - 02/13/24 1149     Visit Number 3    Number of Visits 17    Date for Recertification  03/26/24    Authorization Type Medicare/UMR/GEHA    Authorization - Number of Visits 60   combined   Progress Note Due on Visit 10    PT Start Time 1147    PT Stop Time 1225    PT Time Calculation (min) 38 min    Equipment Utilized During Treatment --    Activity Tolerance Patient tolerated treatment well    Behavior During Therapy [facility] for tasks assessed/performed            Past Medical History:  Diagnosis Date   Allergy 1980's   Sulfa drugs and penecillin   Anxiety    Cataract 2021   Corrected   Chronic kidney disease 2024   Stage 3   Depression    Headache    SINUS   Hyperlipidemia    Hypertension 2021   Osteopenia    Osteopenia    Primary localized osteoarthritis of right knee    Seizures (HCC) 10/2023   Past Surgical History:  Procedure Laterality Date   BREAST EXCISIONAL BIOPSY Right    benign cyst removal    CARPAL TUNNEL RELEASE Right 2015   Ortho in [place]   CHOLECYSTECTOMY N/A 02/08/2022   Procedure: LAPAROSCOPIC CHOLECYSTECTOMY WITH INTRAOPERATIVE CHOLANGIOGRAM;  Surgeon: Debby Hila, MD;  Location: [facility] ORS;  Service: General;  Laterality: N/A;   EYE SURGERY  12/2019   cataracts   FRACTURE SURGERY  2016   Rt.  leg   JOINT REPLACEMENT  09/2015   Right knee   LASIK     MENISCUS REPAIR Right 2011   Orthopedic in [facility]   TOTAL KNEE ARTHROPLASTY Right 09/20/2015   TOTAL KNEE ARTHROPLASTY Right 09/20/2015   Procedure: TOTAL KNEE ARTHROPLASTY;  Surgeon: Lamar Millman, MD;  Location: [facility] OR;  Service: Orthopedics;  Laterality: Right;   Trigger Thumb Right 2015   Done in [place] at the same time  as the carpal tunnel release   TUBAL LIGATION  1980   Patient Active Problem List   Diagnosis Date Noted   Anxiety state 12/26/2023   Debility 12/24/2023   Dizziness 12/16/2023   Seizure (HCC) 10/23/2023   Encephalopathy, metabolic 10/23/2023   AKI (acute kidney injury) 10/23/2023   Hypokalemia 10/23/2023   Seizure-like activity (HCC) 10/17/2023   Trigger finger, left ring finger 09/04/2023   DOE (dyspnea on exertion) 03/02/2023   Stage 3 chronic kidney disease (HCC) 03/02/2023   Left carpal tunnel syndrome 07/25/2022   Primary osteoarthritis of both first carpometacarpal joints 07/25/2022   Arthritis 07/04/2022   Anxiety 07/04/2022   Depression 07/04/2022   Primary osteoarthritis involving multiple joints 06/11/2022   Aortic atherosclerosis 02/05/2022   Facial lesion 09/30/2021   Bilateral hand pain 09/30/2021   Impingement syndrome, shoulder, left 02/16/2021   Impingement syndrome, shoulder, right 09/07/2020   MDD (recurrent major depressive disorder) in remission 09/02/2019   Generalized anxiety disorder 09/02/2019   Primary insomnia 08/16/2019   Post-menopausal 05/14/2017   Prediabetes 01/12/2017   Snoring 07/20/2016   Obsessive-compulsive personality trait 12/07/2015   Primary localized osteoarthritis of right knee    Essential hypertension 08/04/2015  Hyperlipidemia 08/04/2015   Osteopenia 08/04/2015    ONSET DATE: 12/16/23  REFERRING DIAG: G93.41 (ICD-10-CM) - Encephalopathy, metabolic  THERAPY DIAG:  Muscle weakness (generalized)  Unsteadiness on feet  Other low back pain  Rationale for Evaluation and Treatment: Rehabilitation  SUBJECTIVE:                                                                                                                                                                                             SUBJECTIVE STATEMENT: Reports the exercises make her back feel better, but if she pushes too much, she notices soreness.     Pt  accompanied by: self  PERTINENT HISTORY: Anxiety, CKD, depression, HA, HLD, HTN, seizures, R TKA, breast lumpectomy   PAIN:  Are you having pain? Yes: NPRS scale: 5/10 Pain location: B LB Pain description: discomfort, tight Aggravating factors: prolonged standing Relieving factors: heating pad  PRECAUTIONS: Fall and Other: L 4 and 5 ribs fx  RED FLAGS: None   WEIGHT BEARING RESTRICTIONS: No  FALLS: Has patient fallen in last 6 months? Yes. Number of falls 1  LIVING ENVIRONMENT: Lives with: lives with their spouse Lives in: House/apartment Stairs: 3 steps to enter back door with handrial on R; 1 story home Has following equipment at home: Single point cane, Walker - 4 wheeled, Shower bench, bed side commode, and Grab bars  PLOF: Independent, Vocation/Vocational requirements: retired, and Leisure: reading, watching sports  PATIENT GOALS: I'd like to get rid of this tight back and the walker.  OBJECTIVE:     TODAY'S TREATMENT: 02/13/2024 Activity Comments  Pre-walk vitals: 138/80, HR 82, 98%   6 MWT distance:  620 ft with 4WW RPE 6/10 Reports SOB and nearly sweating 2 seated rest breaks  Post walk vitals 164/73, HR 103 bpm O2 97%   Seated pelvic tilts-ant/post tilts, 5 reps Increased soreness with lumbar extension  Seated forward lean to upright posture, 2 x 3 reps Feels better with forward flexion  Supine trunk rotation Guarded motion-cues for smaller, rocking motion to avoid pain  Supine pelvic tilts 2 x 3-5 rep  Hooklying march 2 x 5  Cues to relax as she notes shoulder tension Good form  Standing stagger stance rocking For ant/post rocking, change of positions to help back with standing; pt reports tightness in low back     TODAY'S TREATMENT: 02/11/24 Activity Comments  Vitals at start of session  158/83 mmHg, 84bpm, 96% spO2   sitting pelvic tilts Cueing to reduce ROM to perform within pain-free range  prayer stretch with pball forward, then diagonals   cueing to reduce ROM to perform within  pain-free range. Pt reports that feels good  LTR 20x Cues to avoid straining, move within comfortable ROM   Hooklying fig 4 stretch 30 To tolerance  Hooklying KTOS  stretch 30 To tolerance  Gait training with 4WW to assess back pain 64ft Pt reports the R hip feels more involved  Vitals  173/87 mmHg, 102 bpm, 96spO2   Instruction and practice on Diaphragmatic breathing    vitals 95% spO2, 88bpm, 141/80 mmHg        HOME EXERCISE PROGRAM Last updated: 02/11/24 Access Code: [email or web address] URL: [email or web address] Date: 02/11/2024 Prepared by: [facility] - Outpatient  Rehab - [facility]  Exercises - Seated Flexion Stretch with Swiss Ball  - 1 x daily - 5 x weekly - 2 sets - 10 reps - Seated Thoracic Flexion and Rotation with Swiss Ball  - 1 x daily - 5 x weekly - 2 sets - 10 reps - Supine Lower Trunk Rotation  - 1 x daily - 5 x weekly - 2 sets - 10 reps  ALSO ADDED: sitting diaphragmatic breathing handout PRN   PATIENT EDUCATION: Education details: *02/13/2024:  Reviewed HEP and encouraged gentle, small, motions to avoid pain.  HEP with edu to stop if pain occurs, edu on benefits of diaphragmatic breathing on her vitals and pt's nervous energy that she complains of since hospitalization. Also advised to speak to PCP about this in case this could be a medication side effect  Person educated: Patient Education method: Explanation, Demonstration, Tactile cues, Verbal cues, and Handouts Education comprehension: verbalized understanding and returned demonstration      Note: Objective measures were completed at Evaluation unless otherwise noted.  DIAGNOSTIC FINDINGS:  12/19/23 brain MRI:  Unchanged punctate focus of restricted diffusion in the right hippocampus as described on 12/16/2023 MRI.  12/17/23 MR head angio: Normal intracranial MRA. No large vessel occlusion or other vascular abnormality. No hemodynamically  significant or correctable stenosis.  12/19/23 CT angio: Acute appearing mildly displaced left fourth and fifth lateral rib fractures. Small left-sided pleural effusion with dependent atelectasis in the left base.   COGNITION: Overall cognitive status: Within functional limits for tasks assessed   SENSATION: Pt denies N/T in UE/LEs  COORDINATION: Alternating pronation/supination: WNL B Alternating toe tap: WNL B Finger to nose: tremor, slightly slowed B   MUSCLE TONE: 1-2 bears clonus in B ankles  PALPATION: TTP over posterior R and L ribs, midline LB and B lumbar paraspinals. Very tight in B paraspinals and QL   POSTURE: rounded shoulders, forward head, increased thoracic kyphosis, and R shoulder depressed Observe slight B LE tremor in standing  LOWER EXTREMITY MMT:     MMT  (in sitting) Right Eval Left Eval  Hip flexion 4- 4  Hip extension    Hip abduction 4 4  Hip adduction 4 4  Hip internal rotation    Hip external rotation    Knee flexion 4+ 4+  Knee extension 4+ 4  Ankle dorsiflexion 4+ 4+  Ankle plantarflexion 4+ 4+  Ankle inversion    Ankle eversion     (Blank rows = not tested)  LOWER EXTREMITY ROM:    AROM Right Eval Left Eval  Hip flexion    Hip extension    Hip abduction    Hip adduction    Hip internal rotation    Hip external rotation    Knee flexion    Knee extension    Ankle dorsiflexion 29 15  Ankle plantarflexion    Ankle inversion  Ankle eversion    (Blank rows = not tested)  GAIT: Findings: Assistive device utilized:Walker - 4 wheeled, Level of assistance: Modified independence, and Comments: difficulty maneuvering in tight spaces with walker  FUNCTIONAL TESTS:  5 times sit to stand: 13.65 sec with B armrests Berg: 39/56   [facility] PT Assessment - 01/30/24 0001       Standardized Balance Assessment   Standardized Balance Assessment Berg Balance Test      Berg Balance Test   Sit to Stand Able to stand without using hands  and stabilize independently    Standing Unsupported Able to stand 2 minutes with supervision    Sitting with Back Unsupported but Feet Supported on Floor or Stool Able to sit safely and securely 2 minutes    Stand to Sit Sits safely with minimal use of hands    Transfers Able to transfer safely, minor use of hands    Standing Unsupported with Eyes Closed Able to stand 10 seconds with supervision    Standing Unsupported with Feet Together Needs help to attain position but able to stand for 30 seconds with feet together    From Standing, Reach Forward with Outstretched Arm Can reach forward >12 cm safely (5)    From Standing Position, Pick up Object from Floor Able to pick up shoe, needs supervision    From Standing Position, Turn to Look Behind Over each Shoulder Looks behind one side only/other side shows less weight shift    Turn 360 Degrees Able to turn 360 degrees safely one side only in 4 seconds or less    Standing Unsupported, Alternately Place Feet on Step/Stool Able to complete >2 steps/needs minimal assist    Standing Unsupported, One Foot in Front Able to take small step independently and hold 30 seconds    Standing on One Leg Tries to lift leg/unable to hold 3 seconds but remains standing independently    Total Score 39    Berg comment: pt required 2 sit breaks                                                                                                                                      TREATMENT DATE: 01/30/24    PATIENT EDUCATION: Education details: prognosis, POC, edu on exam findings and how they relate to functional impairments, pt's goals  Person educated: Patient Education method: Explanation Education comprehension: verbalized understanding  HOME EXERCISE PROGRAM: Not yet initiated  GOALS: Goals reviewed with patient? Yes  SHORT TERM GOALS: Target date: 02/27/2024  Patient to be independent with initial HEP. Baseline: HEP initiated Goal status: IN  PROGRESS    LONG TERM GOALS: Target date: 03/26/2024  Patient to be independent with advanced HEP. Baseline: Not yet initiated  Goal status: IN PROGRESS  Patient to demonstrate B LE strength >/=4+/5.  Baseline: See above Goal status: IN PROGRESS  6 minute walk to improve to at least  700 ft, for improved gait efficiency and endurance.  Baseline: 620 ft, 2 seated rest breaks Goal status: IN PROGRESS  Patient to ambulate 24ft without AD with good safety and stability.  Baseline: using 4WW Goal status: IN PROGRESS  Patient to demonstrate 5xSTS test in <15 sec without UE support in order to decrease risk of falls.  Baseline: 13 sec with B armrests Goal status: IN PROGRESS  Patient to score at least 45/56 on Berg in order to decrease risk of falls.  Baseline: 39 Goal status: IN PROGRESS  Patient to report tolerance for 30 minutes of chores without rest break without fatigue limiting.  Baseline: 5-7 minutes  Goal status: IN PROGRESS  ASSESSMENT:  CLINICAL IMPRESSION: Pt presents today and assessed 6 MWT, with pt ambulating 620 ft in 6 minutes, with 2 seated rest breaks.  She notes having some muscle soreness after performing HEP from last session and educated pt on slowed, gentle motions to avoid pain.  Worked on varied sitting, standing, supine positions to try to relax back muscles and pt seems guarded throughout; when we challenged with larger movement patterns, she began more tremulous movements and noted increased SOB.   Pt will continue to benefit from skilled PT towards goals for improved functional mobility and decreased fall risk.    OBJECTIVE IMPAIRMENTS: Abnormal gait, decreased activity tolerance, decreased balance, decreased coordination, decreased endurance, decreased mobility, difficulty walking, decreased strength, increased muscle spasms, postural dysfunction, and pain.   ACTIVITY LIMITATIONS: carrying, lifting, bending, sitting, standing, squatting, stairs,  transfers, bathing, toileting, dressing, reach over head, hygiene/grooming, and locomotion level  PARTICIPATION LIMITATIONS: meal prep, cleaning, laundry, shopping, community activity, and church  PERSONAL FACTORS: Age, Fitness, Past/current experiences, Time since onset of injury/illness/exacerbation, and 3+ comorbidities: Anxiety, CKD, depression, HA, HLD, HTN, seizures, R TKA, breast lumpectomy  are also affecting patient's functional outcome.   REHAB POTENTIAL: Good  CLINICAL DECISION MAKING: Evolving/moderate complexity  EVALUATION COMPLEXITY: Moderate  PLAN:  PT FREQUENCY: 2x/week  PT DURATION: 8 weeks  PLANNED INTERVENTIONS: 97164- PT Re-evaluation, 97750- Physical Performance Testing, 97110-Therapeutic exercises, 97530- Therapeutic activity, 97112- Neuromuscular re-education, 97535- Self Care, [id number]- Manual therapy, [phone]- Gait training, [phone]- Canalith repositioning, [id number]- Aquatic Therapy, [phone] (1-2 muscles), [id number] (3+ muscles)- Dry Needling, Patient/Family education, Balance training, Stair training, Taping, Joint mobilization, Spinal mobilization, Vestibular training, DME instructions, Cryotherapy, and Moist heat  PLAN FOR NEXT SESSION: Try to initiate HEP with standing balance, hip strength, gentle lumbar mobility while being mindful of L ribs fx     Greig Anon, PT 02/13/2024 12:39 PM Phone: [phone] Fax: [phone]   [facility] at [facility] [address], [address] [place], [place] [address] Phone # [phone] Fax # [phone]        "

## 2024-02-13 NOTE — Therapy (Signed)
 " OUTPATIENT SPEECH LANGUAGE PATHOLOGY APHASIA TREATMENT   Patient Name: [name] MRN: [id number] DOB:09-16-1945, 78 y.o., female Today's Date: 02/13/2024  PCP: Wendolyn Jenkins Jansky, MD REFERRING PROVIDER: Gregg Lek, MD  END OF SESSION:  End of Session - 02/13/24 1707     Visit Number 4    Number of Visits 17    Date for Recertification  04/04/24    SLP Start Time 1103    SLP Stop Time  1145    SLP Time Calculation (Emma) 42 Emma    Activity Tolerance Patient tolerated treatment well             Past Medical History:  Diagnosis Date   Allergy 1980's   Sulfa drugs and penecillin   Anxiety    Cataract 2021   Corrected   Chronic kidney disease 2024   Stage 3   Depression    Headache    SINUS   Hyperlipidemia    Hypertension 2021   Osteopenia    Osteopenia    Primary localized osteoarthritis of right knee    Seizures (HCC) 10/2023   Past Surgical History:  Procedure Laterality Date   BREAST EXCISIONAL BIOPSY Right    benign cyst removal    CARPAL TUNNEL RELEASE Right 2015   Ortho in [place]   CHOLECYSTECTOMY N/A 02/08/2022   Procedure: LAPAROSCOPIC CHOLECYSTECTOMY WITH INTRAOPERATIVE CHOLANGIOGRAM;  Surgeon: Debby Hila, MD;  Location: [facility];  Service: General;  Laterality: N/A;   EYE SURGERY  12/2019   cataracts   FRACTURE SURGERY  2016   Rt.  leg   JOINT REPLACEMENT  09/2015   Right knee   LASIK     MENISCUS REPAIR Right 2011   [facility]thopedic in [facility]   TOTAL KNEE ARTHROPLASTY Right 09/20/2015   TOTAL KNEE ARTHROPLASTY Right 09/20/2015   Procedure: TOTAL KNEE ARTHROPLASTY;  Surgeon: Lamar Millman, MD;  Location: [facility] OR;  Service: Orthopedics;  Laterality: Right;   Trigger Thumb Right 2015   Done in [place] at the same time as the carpal tunnel release   TUBAL LIGATION  1980   Patient Active Problem List   Diagnosis Date Noted   Anxiety state 12/26/2023   Debility 12/24/2023   Dizziness 12/16/2023   Seizure (HCC) 10/23/2023    Encephalopathy, metabolic 10/23/2023   AKI (acute kidney injury) 10/23/2023   Hypokalemia 10/23/2023   Seizure-like activity (HCC) 10/17/2023   Trigger finger, left ring finger 09/04/2023   DOE (dyspnea on exertion) 03/02/2023   Stage 3 chronic kidney disease (HCC) 03/02/2023   Left carpal tunnel syndrome 07/25/2022   Primary osteoarthritis of both first carpometacarpal joints 07/25/2022   Arthritis 07/04/2022   Anxiety 07/04/2022   Depression 07/04/2022   Primary osteoarthritis involving multiple joints 06/11/2022   Aortic atherosclerosis 02/05/2022   Facial lesion 09/30/2021   Bilateral hand pain 09/30/2021   Impingement syndrome, shoulder, left 02/16/2021   Impingement syndrome, shoulder, right 09/07/2020   MDD (recurrent major depressive disorder) in remission 09/02/2019   Generalized anxiety disorder 09/02/2019   Primary insomnia 08/16/2019   Post-menopausal 05/14/2017   Prediabetes 01/12/2017   Snoring 07/20/2016   Obsessive-compulsive personality trait 12/07/2015   Primary localized osteoarthritis of right knee    Essential hypertension 08/04/2015   Hyperlipidemia 08/04/2015   Osteopenia 08/04/2015    ONSET DATE: 12/16/23   REFERRING DIAG: R47.81 (ICD-10-CM) - Slurred speech R47.89 (ICD-10-CM) - Word finding difficulty  THERAPY DIAG:  Cognitive communication deficit  Aphasia  Rationale for Evaluation and Treatment: Rehabilitation  SUBJECTIVE:   SUBJECTIVE STATEMENT: I get anxious easily.  Pt accompanied by: self  PERTINENT HISTORY: PMH of HTN, HLD, osteopenia, seizures, and CKD. She was hospitalized in September with AMS. She presented to [facility] on 12/16/23 with falls and slurred speech. She had hit her head and reported nausea and diarrhea. CT head was negative for acute abnormalities. CXR revealed mild cardiomegaly with no acute infiltrates. C-spine, right knee, right rib, and left shoulder Xrs have been negative. UA was WNL. MRI showed possible punctate stroke  MRA negative for LVO. LTM EEG on 11/4 is negative for seizures. LTM EEG on 11/5 was also negative for seizures. Patient is being admitted to CIR for impaired mobility and ADLs.   PAIN:  Are you having pain? Yes: NPRS scale: 2/10 Pain location: lower back Pain description: it's so tight, dull Aggravating factors: walking or standing Relieving factors: heat  FALLS: Has patient fallen in last 6 months?  See PT evaluation for details   PATIENT GOALS: Get rid of this tremor. Get my talking back to normal  OBJECTIVE:  Note: Objective measures were completed at Evaluation unless otherwise noted.  DIAGNOSTIC FINDINGS:  SLE 02/23/23: Cognitive/ Linguistic. Pt presents with a mild expressive language deficit. Portions of the Western Aphasia Battery- Bedside Form completed revealing strengths in naming, auditory comprehension, and repetition. Her fluency was described as; some hesitations and word finding difficulty. Pt confirms changes to language upon hospitalization however, reports symptoms have largely resolved and she is back to baseline. Informally, some instances of phonemic paraphasias noted with pt able to self correct consistently. She also demonstrated some mild cluttering though it did not impact communication. Patient's overall cognitive functioning was not formally assessed but appeared [facility] during functional tasks. Patient also endorses that she feels she is at her cognitive baseline. She was oriented x4 and verbalized recent medical hx in detail. No follow up intervention warranted at this time.    PATIENT REPORTED OUTCOME MEASURES (PROM): Communication Effectiveness Survey: provided in first 1-2 sessions.                                                                                                                             TREATMENT DATE:  SFA-Semantic Feature Analysis  02/13/24: Today SLP educated pt about VNEST. Pt noted to incr anxious sx as session progressed and her  handwriting got more illegible. She misspelled words more  after 5 minutes and this appeared to incr her anxious sx. She then began to not follow SLP directions and demonstrated more frequent impulsive behavior. SLP cued pt to slow down her task completion and her writing with Emma-mod success, for a short time (30 seconds) and then pt req'd cues again.   02/11/24: Pt needs VNEST next session. I think I've gotten pretty good at halfway through the sentence finding a different word to use. SLP educated pt about SFA today and pt noted to rush through the task and appear to ignore SLP cues.  Attention appeared Emma varying to moderately worse today than prior sessions. Pt made articulatory errors x5 today - more repetitious in nature than aphasic. She asked at end of session, Is there something I can do to work on this 'blahblhblhblah (pt imitation of her articulatory errors today)? SLP provided consonant-laden phrases and modeled slow and more deliberate articulation with these sentences for pt. She return demonstrated very well.   02/04/24: Pt with one phonemic paraphasia in today's session. SLUMS today. Anmol scored 24/30 however SLP wonders if pt's visual acuity/spatial orientation was deficient for clock draw, as pt demonstrated [facility] explanations for verbal problem solving, organization/planning, and executive function (buying carpet, and if unexpected changes occurred in the buying experience). Pt indicated her husband fills the medbox but she is independent in taking meds. She is not writing checks for bills at this time due to her writing (reportedly), she is reminding husband to pay (They are just sitting around, I'm used to paying them more quickly). On eval date she told SLP she is not paying bills due to feeling foggy. Lastly, pt states that she writes appointments on the calendar religiously and (reportedly) knew about today's appointment when she awoke this morning. Pt's working memory/mental  math/number manipulation was WNL  01/29/25: Eval completed. N/A.  PATIENT EDUCATION: Education details: SLP suspects deficits are mostly linguistic in nature, possible goals for ST Person educated: Patient Education method: Explanation Education comprehension: verbalized understanding   GOALS: Goals reviewed with patient? Yes  SHORT TERM GOALS: Target date: 02/29/24  Pt will undergo cognitive testing and complete PROM in first 2 sessions Baseline: Goal status: met  2.  Pt will use trained compensations for anomic episodes in 5 minutes simple conversation x2 sessions Baseline:  Goal status: INITIAL  3.  Pt will complete SFA and /or VNEST with occasional Emma A x2 sessions Baseline:  Goal status: INITIAL  4.  Pt will use slowed rate to foster more fluid speech in sentence responses 80% of the time in 2 sessions Baseline:  Goal status: INITIAL   LONG TERM GOALS: Target date: 04/04/24  Pt will improve PROM Baseline:  Goal status: INITIAL  2.  Pt will use trained compensations for anomic episodes in 8 minutes simple conversation x2 sessions Baseline:  Goal status: INITIAL  3.  Pt will complete SFA and /or VNEST with rare Emma A x2 sessions Baseline:  Goal status: INITIAL  4.  Pt will use slowed rate to foster more fluid speech 80% of the time in 8 minutes of simple conversation in 2 sessions Baseline:  Goal status: INITIAL   ASSESSMENT:  CLINICAL IMPRESSION: Patient is a 78 y.o. F who was seen today for speech/language in light of seizure and . metabolic encephalopathy with acute kidney injury. See treatment date above for today's date for further details on today's session. Pt demonstrated sx of anxiety in the last two sessions however today pt entered without demonstrating these and they began after SLP began therapy task today. On date of eval Cianna stated I can't come up with the word I was sometimes, and sometimes it comes out strangely. Pt also endorsed  repetitions in her speech, with reduced speech fluidity since November admission. Lastly she told SLP that her husband is handling monthly bills, something pt used to do prior to hospitalization in September. Today SLP completed cognitive evaluation but suspects errors for clock drawing were visuospatial in nature and not cognitive as verbally, pt demonstrated good planning, organization, and executive function. No goals  will be added.   OBJECTIVE IMPAIRMENTS: include attention, memory, executive functioning, and aphasia. These impairments are limiting patient from managing medications, managing finances, household responsibilities, ADLs/IADLs, and effectively communicating at home and in community. Factors affecting potential to achieve goals and functional outcome are co-morbidities. Patient will benefit from skilled SLP services to address above impairments and improve overall function.  REHAB POTENTIAL: Good  PLAN:  SLP FREQUENCY: 2x/week  SLP DURATION: 8 weeks  PLANNED INTERVENTIONS: Language facilitation, Environmental controls, Cognitive reorganization, Internal/external aids, Functional tasks, Multimodal communication approach, SLP instruction and feedback, Compensatory strategies, Patient/family education, and [id number] Treatment of speech (30 or 45 Emma)     Debbe Crumble, CCC-SLP 02/13/2024, 5:08 PM      "

## 2024-02-15 ENCOUNTER — Other Ambulatory Visit (HOSPITAL_COMMUNITY): Payer: Self-pay

## 2024-02-15 ENCOUNTER — Encounter: Payer: Self-pay | Admitting: Family Medicine

## 2024-02-15 ENCOUNTER — Ambulatory Visit: Admitting: Family Medicine

## 2024-02-15 VITALS — BP 110/52 | HR 85 | Temp 98.4°F | Resp 16 | Ht 62.0 in | Wt 177.6 lb

## 2024-02-15 DIAGNOSIS — L989 Disorder of the skin and subcutaneous tissue, unspecified: Secondary | ICD-10-CM | POA: Diagnosis not present

## 2024-02-15 DIAGNOSIS — R569 Unspecified convulsions: Secondary | ICD-10-CM | POA: Diagnosis not present

## 2024-02-15 DIAGNOSIS — F411 Generalized anxiety disorder: Secondary | ICD-10-CM

## 2024-02-15 DIAGNOSIS — F5101 Primary insomnia: Secondary | ICD-10-CM

## 2024-02-15 DIAGNOSIS — D649 Anemia, unspecified: Secondary | ICD-10-CM

## 2024-02-15 MED ORDER — POTASSIUM CHLORIDE CRYS ER 10 MEQ PO TBCR
10.0000 meq | EXTENDED_RELEASE_TABLET | Freq: Every day | ORAL | 0 refills | Status: AC
  Filled 2024-02-15 – 2024-03-16 (×2): qty 90, 90d supply, fill #0

## 2024-02-15 MED ORDER — CEPHALEXIN 500 MG PO CAPS
500.0000 mg | ORAL_CAPSULE | Freq: Two times a day (BID) | ORAL | 0 refills | Status: AC
  Filled 2024-02-15: qty 14, 7d supply, fill #0

## 2024-02-15 MED ORDER — BUSPIRONE HCL 10 MG PO TABS
10.0000 mg | ORAL_TABLET | Freq: Two times a day (BID) | ORAL | 1 refills | Status: DC
  Filled 2024-02-15: qty 60, 30d supply, fill #0
  Filled 2024-02-23 – 2024-02-25 (×2): qty 60, 30d supply, fill #1
  Filled ????-??-??: fill #1

## 2024-02-15 MED ORDER — ZOLPIDEM TARTRATE 5 MG PO TABS
5.0000 mg | ORAL_TABLET | Freq: Every evening | ORAL | 1 refills | Status: DC | PRN
  Filled 2024-02-15: qty 30, 30d supply, fill #0
  Filled 2024-03-17 (×2): qty 30, 30d supply, fill #1

## 2024-02-15 NOTE — Patient Instructions (Addendum)
 GI plans to do labs again in 3 months  Call and see if neuro can see you sooner.  Tremors  Increase buspar  to 10mg  2x/day  Warm soaks to armpit 2x/day. 5-10 minutes  For 7 days  do the antibiotics  Let me know if not better.  [phone] for mammogram, -schedule

## 2024-02-15 NOTE — Progress Notes (Signed)
 "  Subjective:     Patient ID: [name], female    DOB: April 04, 1945, 79 y.o.   MRN: [id number]  Chief Complaint  Patient presents with   Mass    Right arm pin   Injections    Wants to discuss when to get Covid and Flu vaccine due to just having seizure.     Discussed the use of AI scribe software for clinical note transcription with the patient, who gave verbal consent to proceed.  History of Present Illness [name] is a 79 year old female who presents with concerns about abnormal blood work and a lump in her right armpit. She is accompanied by her husband.  She is currently undergoing physical, speech, and occupational therapy due to tightness in her back and shaking. Her speech is affected, with difficulty articulating words. She has been in therapy for a couple of weeks.  She recently visited a gastroenterologist and had blood work done, revealing low iron levels, ferritin at the low end of normal, and super therapeutic B12 levels. She takes B12 daily and is considering reducing the frequency. She takes iron once a day. Platelets were noted to be high. She is scheduled to follow up with the gastroenterologist.  She has been experiencing a cold for about a week, with hoarseness but no fever or chills. She has not yet received COVID and flu shots this fall.  She takes zolpidem  for sleep, one tablet per night, and reports improved sleep. She is on buspirone  for anxiety, taking it twice a day, but does not notice a significant difference in her anxiety levels. She feels very anxious due to ongoing medical issues.  She discovered a lump in her right armpit about a week ago while showering. It is slightly painful and reminds her of a sore pimple. No nipple discharge or breast pain. She is overdue for a mammogram, which she has not had in two years. There is no family history of breast cancer, but there is a history of heart trouble.  She takes venlafaxine  150 mg daily  for anxiety and Evista  for osteoporosis. She has experienced tremors, which improved after hospitalization but have since worsened. She is on lacosamide  100 mg twice a day for seizures. She has a history of slight tremors, which she attributed to aging.  She has been experiencing diarrhea, which was initially severe but has improved. She takes potassium supplements due to low levels from diarrhea and uses magnesium  and fiber to manage her symptoms.    There are no preventive care reminders to display for this patient.  Past Medical History:  Diagnosis Date   Allergy 1980's   Sulfa drugs and penecillin   Anxiety    Cataract 2021   Corrected   Chronic kidney disease 2024   Stage 3   Depression    Headache    SINUS   Hyperlipidemia    Hypertension 2021   Osteopenia    Osteopenia    Primary localized osteoarthritis of right knee    Seizures (HCC) 10/2023    Past Surgical History:  Procedure Laterality Date   BREAST EXCISIONAL BIOPSY Right    benign cyst removal    CARPAL TUNNEL RELEASE Right 2015   Ortho in [place]   CHOLECYSTECTOMY N/A 02/08/2022   Procedure: LAPAROSCOPIC CHOLECYSTECTOMY WITH INTRAOPERATIVE CHOLANGIOGRAM;  Surgeon: Debby Hila, MD;  Location: [facility];  Service: General;  Laterality: N/A;   EYE SURGERY  12/2019   cataracts   FRACTURE SURGERY  2016   Rt.  leg   JOINT REPLACEMENT  09/2015   Right knee   LASIK     MENISCUS REPAIR Right 2011   Orthopedic in [facility]   TOTAL KNEE ARTHROPLASTY Right 09/20/2015   TOTAL KNEE ARTHROPLASTY Right 09/20/2015   Procedure: TOTAL KNEE ARTHROPLASTY;  Surgeon: Lamar Millman, MD;  Location[facility] OR;  Service: Orthopedics;  Laterality: Right;   Trigger Thumb Right 2015   Done in [place] at the same time as the carpal tunnel release   TUBAL LIGATION  1980    Current Medications[1]  Allergies[2] ROS neg/noncontributory except as noted HPI/below      Objective:     BP (!) 110/52   Pulse 85   Temp 98.4 F (36.9  C) (Temporal)   Resp 16   Ht 5' 2 (1.575 m)   Wt 177 lb 9.6 oz (80.6 kg)   SpO2 96%   BMI 32.48 kg/m  Wt Readings from Last 3 Encounters:  02/15/24 177 lb 9.6 oz (80.6 kg)  01/16/24 182 lb (82.6 kg)  01/09/24 177 lb (80.3 kg)    Physical Exam GENERAL: Well developed, well nourished, no acute distress. HEAD EYES EARS NOSE THROAT: Normocephalic, atraumatic, conjunctiva not injected, sclera nonicteric. CARDIAC: Regular rate and rhythm, S1 S2 present, no murmur, dorsalis pedis 2 plus bilaterally. NECK: Supple, no thyromegaly, no nodes, no carotid bruits. LUNGS: Clear to auscultation bilaterally, no wheezes. ABDOMEN: Bowel sounds present, soft, non-tender, non-distended, no hepatosplenomegaly, no masses. EXTREMITIES: No edema. MUSCULOSKELETAL: walker NEUROLOGICAL: Alert and oriented x3, cranial nerves II through XII intact. Intermitt hand tremors PSYCHIATRIC: Anxious mood, good eye contact. BREAST: Lump in right axilla, tender, not lymph node type, possible abscess. Some clear d/c, at surface.  red.  No breast lumps/no nipple d/c.  +B inverted nipples   PDMP checked.  Husband doesn't think they picked up the early December refill on zolpidem .     Assessment & Plan:  Seizure-like activity (HCC)  Anemia, unspecified type  Skin lesion  Generalized anxiety disorder -     busPIRone  HCl; Take 1 tablet (10 mg total) by mouth 2 (two) times daily.  Dispense: 60 tablet; Refill: 1  Primary insomnia  Other orders -     Zolpidem  Tartrate; Take 1 tablet (5 mg total) by mouth at bedtime as needed for sleep.  Dispense: 30 tablet; Refill: 1 -     Potassium Chloride  Crys ER; Take 1 tablet (10 mEq total) by mouth daily.  Dispense: 90 tablet; Refill: 0 -     Cephalexin ; Take 1 capsule (500 mg total) by mouth 2 (two) times daily for 7 days.  Dispense: 14 capsule; Refill: 0    Assessment and Plan Assessment & Plan Cutaneous abscess of right axilla   A tender lump in the right axilla, present  for about a week, is likely a cutaneous abscess or infection, not a lymph node or breast cancer. There is no significant lymphadenopathy or breast abnormalities. Prescribed Keflex  for infection. Instructed to perform warm soaks to the axilla twice daily for 7 days, 5-10 minutes each time. Advised to avoid squeezing the lump. Recommended scheduling a mammogram.  Generalized anxiety disorder   Persistent anxiety despite current treatment with buspirone  and venlafaxine  may be contributing to tremors and overall discomfort. Increased buspirone  to 10 mg twice daily. Advised to contact neurology for evaluation of tremors-poss worsened by antisz meds.  Iron deficiency anemia   Iron levels are low, with ferritin at the low end of normal. Platelets  are elevated, possibly due to iron deficiency, but hemoglobin is stable. Gastroenterology is monitoring blood levels and plans to repeat labs in three months. Colonoscopy is anticipated if anemia persists. Continue iron supplementation daily. Follow up with gastroenterology for repeat labs in three months. Will consider hematology referral if anemia does not improve.  Epilepsy   Seizure medication may be contributing to tremors. Neurology follow-up is scheduled for April, but earlier consultation is advised due to tremors. Contact neurology for earlier evaluation of tremors.  Insomnia   Recent pharmacy issues with prescription refills. Sleep may be improving with current medication regimen. Resent zolpidem  prescription to pharmacy. Continue zolpidem  as prescribed.  General Health Maintenance   Discussion about receiving COVID and flu vaccinations. She has not received these vaccinations this fall. Recommended receiving COVID and flu vaccinations at the pharmacy.     Return in about 4 weeks (around 03/14/2024) for mood  cancel the Jan 16 appt.  Jenkins CHRISTELLA Carrel, MD     [1]  Current Outpatient Medications:    acetaminophen  (TYLENOL ) 500 MG tablet, Take 500 mg  by mouth every 6 (six) hours as needed for moderate pain (pain score 4-6)., Disp: , Rfl:    amLODipine -benazepril  (LOTREL ) 5-20 MG capsule, Take 1 capsule by mouth daily., Disp: 90 capsule, Rfl: 3   aspirin  EC 81 MG tablet, Take 1 tablet (81 mg total) by mouth daily. Swallow whole., Disp: 30 tablet, Rfl: 12   atorvastatin  (LIPITOR) 20 MG tablet, Take 1 tablet (20 mg total) by mouth daily., Disp: 90 tablet, Rfl: 3   cephALEXin  (KEFLEX ) 500 MG capsule, Take 1 capsule (500 mg total) by mouth 2 (two) times daily for 7 days., Disp: 14 capsule, Rfl: 0   Lacosamide  100 MG TABS, Take 1 tablet (100 mg total) by mouth 2 (two) times daily., Disp: 60 tablet, Rfl: 5   Multiple Vitamins-Minerals (MULTIVITAMIN WOMEN 50+) TABS, Take 1 tablet by mouth daily., Disp: , Rfl:    omeprazole  (PRILOSEC) 40 MG capsule, Take 1 capsule (40 mg total) by mouth daily., Disp: 90 capsule, Rfl: 1   OVER THE COUNTER MEDICATION, Take 1 capsule by mouth every 12 (twelve) hours as needed (headache, cold/flu-like symptoms). OTC cold capsules, Disp: , Rfl:    Polyethyl Glycol-Propyl Glycol 0.4-0.3 % SOLN, Place 1 drop into both eyes at bedtime., Disp: , Rfl:    raloxifene  (EVISTA ) 60 MG tablet, Take 1 tablet (60 mg total) by mouth daily., Disp: 90 tablet, Rfl: 3   venlafaxine  XR (EFFEXOR -XR) 150 MG 24 hr capsule, Take 1 capsule (150 mg total) by mouth daily with breakfast., Disp: 30 capsule, Rfl: 1   busPIRone  (BUSPAR ) 10 MG tablet, Take 1 tablet (10 mg total) by mouth 2 (two) times daily., Disp: 60 tablet, Rfl: 1   potassium chloride  (KLOR-CON  M) 10 MEQ tablet, Take 1 tablet (10 mEq total) by mouth daily., Disp: 90 tablet, Rfl: 0   zolpidem  (AMBIEN ) 5 MG tablet, Take 1 tablet (5 mg total) by mouth at bedtime as needed for sleep., Disp: 30 tablet, Rfl: 1 [2]  Allergies Allergen Reactions   Penicillins Hives    Tolerates Cephalosporins    Sulfa Antibiotics Hives        Prednisone  Other (See Comments)    Hyperactivity Sleep  disturbance Confusion   "

## 2024-02-18 ENCOUNTER — Other Ambulatory Visit (HOSPITAL_COMMUNITY): Payer: Self-pay

## 2024-02-18 DIAGNOSIS — D509 Iron deficiency anemia, unspecified: Secondary | ICD-10-CM

## 2024-02-18 MED ORDER — NA SULFATE-K SULFATE-MG SULF 17.5-3.13-1.6 GM/177ML PO SOLN: ORAL | 0 refills | Status: AC

## 2024-02-19 ENCOUNTER — Ambulatory Visit

## 2024-02-19 ENCOUNTER — Ambulatory Visit: Attending: Physical Medicine and Rehabilitation

## 2024-02-19 ENCOUNTER — Ambulatory Visit: Admitting: Occupational Therapy

## 2024-02-19 DIAGNOSIS — R29818 Other symptoms and signs involving the nervous system: Secondary | ICD-10-CM

## 2024-02-19 DIAGNOSIS — R251 Tremor, unspecified: Secondary | ICD-10-CM | POA: Insufficient documentation

## 2024-02-19 DIAGNOSIS — M5459 Other low back pain: Secondary | ICD-10-CM | POA: Insufficient documentation

## 2024-02-19 DIAGNOSIS — R41841 Cognitive communication deficit: Secondary | ICD-10-CM | POA: Insufficient documentation

## 2024-02-19 DIAGNOSIS — R2681 Unsteadiness on feet: Secondary | ICD-10-CM

## 2024-02-19 DIAGNOSIS — R278 Other lack of coordination: Secondary | ICD-10-CM | POA: Insufficient documentation

## 2024-02-19 DIAGNOSIS — M6281 Muscle weakness (generalized): Secondary | ICD-10-CM

## 2024-02-19 DIAGNOSIS — R4701 Aphasia: Secondary | ICD-10-CM | POA: Insufficient documentation

## 2024-02-19 NOTE — Therapy (Signed)
 " OUTPATIENT SPEECH LANGUAGE PATHOLOGY TREATMENT   Patient Name: [name] MRN: [id number] DOB:[birth year], 79 y.o., female Today's Date: 02/19/2024  PCP: Emma Jenkins Jansky, MD REFERRING PROVIDER: Gregg Lek, MD  END OF SESSION:  End of Session - 02/19/24 1151     Visit Number 5    Number of Visits 17    Date for Recertification  04/04/24    SLP Start Time 1151    SLP Stop Time  1231    SLP Time Calculation (min) 40 min    Activity Tolerance Patient tolerated treatment well             Past Medical History:  Diagnosis Date   Allergy 1980's   Sulfa drugs and penecillin   Anxiety    Cataract 2021   Corrected   Chronic kidney disease 2024   Stage 3   Depression    Headache    SINUS   Hyperlipidemia    Hypertension 2021   Osteopenia    Osteopenia    Primary localized osteoarthritis of right knee    Seizures (HCC) 10/2023   Past Surgical History:  Procedure Laterality Date   BREAST EXCISIONAL BIOPSY Right    benign cyst removal    CARPAL TUNNEL RELEASE Right 2015   Ortho in [place]   CHOLECYSTECTOMY N/A 02/08/2022   Procedure: LAPAROSCOPIC CHOLECYSTECTOMY WITH INTRAOPERATIVE CHOLANGIOGRAM;  Surgeon: Debby Hila, MD;  Location: [facility];  Service: General;  Laterality: N/A;   EYE SURGERY  12/2019   cataracts   FRACTURE SURGERY  2016   Rt.  leg   JOINT REPLACEMENT  09/2015   Right knee   LASIK     MENISCUS REPAIR Right 2011   Orthopedic in [facility]   TOTAL KNEE ARTHROPLASTY Right 09/20/2015   TOTAL KNEE ARTHROPLASTY Right 09/20/2015   Procedure: TOTAL KNEE ARTHROPLASTY;  Surgeon: Lamar Millman, MD;  Location: [facility] OR;  Service: Orthopedics;  Laterality: Right;   Trigger Thumb Right 2015   Done in [place] at the same time as the carpal tunnel release   TUBAL LIGATION  1980   Patient Active Problem List   Diagnosis Date Noted   Anxiety state 12/26/2023   Debility 12/24/2023   Dizziness 12/16/2023   Seizure (HCC) 10/23/2023   Encephalopathy,  metabolic 10/23/2023   AKI (acute kidney injury) 10/23/2023   Hypokalemia 10/23/2023   Seizure-like activity (HCC) 10/17/2023   Trigger finger, left ring finger 09/04/2023   DOE (dyspnea on exertion) 03/02/2023   Stage 3 chronic kidney disease (HCC) 03/02/2023   Left carpal tunnel syndrome 07/25/2022   Primary osteoarthritis of both first carpometacarpal joints 07/25/2022   Arthritis 07/04/2022   Anxiety 07/04/2022   Depression 07/04/2022   Primary osteoarthritis involving multiple joints 06/11/2022   Aortic atherosclerosis 02/05/2022   Facial lesion 09/30/2021   Bilateral hand pain 09/30/2021   Impingement syndrome, shoulder, left 02/16/2021   Impingement syndrome, shoulder, right 09/07/2020   MDD (recurrent major depressive disorder) in remission 09/02/2019   Generalized anxiety disorder 09/02/2019   Primary insomnia 08/16/2019   Post-menopausal 05/14/2017   Prediabetes 01/12/2017   Snoring 07/20/2016   Obsessive-compulsive personality trait 12/07/2015   Primary localized osteoarthritis of right knee    Essential hypertension 08/04/2015   Hyperlipidemia 08/04/2015   Osteopenia 08/04/2015    ONSET DATE: 12/16/23   REFERRING DIAG: R47.81 (ICD-10-CM) - Slurred speech R47.89 (ICD-10-CM) - Word finding difficulty  THERAPY DIAG:  Cognitive communication deficit  Aphasia  Rationale for Evaluation and Treatment: Rehabilitation  SUBJECTIVE:   SUBJECTIVE STATEMENT: I get anxious and it makes it (speech errors) worse  Pt accompanied by: self  PERTINENT HISTORY: PMH of HTN, HLD, osteopenia, seizures, and CKD. She was hospitalized in September with AMS. She presented to [facility] on 12/16[facility]23 with falls and slurred speech. She had hit her head and reported nausea and diarrhea. CT head was negative for acute abnormalities. CXR revealed mild cardiomegaly with no acute infiltrates. C-spine, right knee, right rib, and left shoulder Xrs have been negative. UA was WNL. MRI showed possible  punctate stroke MRA negative for LVO. LTM EEG on 11/4 is negative for seizures. LTM EEG on 11/5 was also negative for seizures. Patient is being admitted to CIR for impaired mobility and ADLs.   PAIN:  Are you having pain? Yes: NPRS scale: 2/10 Pain location: lower back Pain description: it's so tight, dull Aggravating factors: walking or standing Relieving factors: heat  FALLS: Has patient fallen in last 6 months?  See PT evaluation for details   PATIENT GOALS: Get rid of this tremor. Get my talking back to normal  OBJECTIVE:  Note: Objective measures were completed at Evaluation unless otherwise noted.  DIAGNOSTIC FINDINGS:  SLE 02/23/23: Cognitive/ Linguistic. Pt presents with a mild expressive language deficit. Portions of the Western Aphasia Battery- Bedside Form completed revealing strengths in naming, auditory comprehension, and repetition. Her fluency was described as; some hesitations and word finding difficulty. Pt confirms changes to language upon hospitalization however, reports symptoms have largely resolved and she is back to baseline. Informally, some instances of phonemic paraphasias noted with pt able to self correct consistently. She also demonstrated some mild cluttering though it did not impact communication. Patient's overall cognitive functioning was not formally assessed but appeared [facility] during functional tasks. Patient also endorses that she feels she is at her cognitive baseline. She was oriented x4 and verbalized recent medical hx in detail. No follow up intervention warranted at this time.    PATIENT REPORTED OUTCOME MEASURES (PROM): Communication Effectiveness Survey: provided in first 1-2 sessions.                                                                                                                             TREATMENT DATE:  SFA-Semantic Feature Analysis  02/19/24: SLP engaged pt in disucssion about what would be best for her to practice today.  Pt, given her s statement, stated she would like to target slowed rate compensation today so that errors in her speech will occur less and thus her communication will not decline as frequently (due to decr'd frequency of anxiety from speaking errors). SLP modeled in sentences and pt repeated 90% success. In multiple sentence generation tasks today pt maintained WNL speech fluidity 85-90% of the time. SLP provided rare min A for slowing rate and overarticulation. Homework for generating slower speech rate in sentences.   02/13/24: Today SLP educated pt about VNEST. Pt noted to incr anxious sx as session progressed  and her handwriting got more illegible. She misspelled words more after 5 minutes and this appeared to incr her anxious sx. She then began to not follow SLP directions and demonstrated more frequent impulsive behavior. SLP cued pt to slow down her task completion and her writing with min-mod success, for a short time (30 seconds) and then pt req'd cues again.   02/11/24: Pt needs VNEST next session. I think I've gotten pretty good at halfway through the sentence finding a different word to use. SLP educated pt about SFA today and pt noted to rush through the task and appear to ignore SLP cues. Attention appeared min varying to moderately worse today than prior sessions. Pt made articulatory errors x5 today - more repetitious in nature than aphasic. She asked at end of session, Is there something I can do to work on this 'blahblhblhblah (pt imitation of her articulatory errors today)? SLP provided consonant-laden phrases and modeled slow and more deliberate articulation with these sentences for pt. She return demonstrated very well.   02/04/24: Pt with one phonemic paraphasia in today's session. SLUMS today. Cheronda scored 24/30 however SLP wonders if pt's visual acuity/spatial orientation was deficient for clock draw, as pt demonstrated [facility] explanations for verbal problem solving,  organization/planning, and executive function (buying carpet, and if unexpected changes occurred in the buying experience). Pt indicated her husband fills the medbox but she is independent in taking meds. She is not writing checks for bills at this time due to her writing (reportedly), she is reminding husband to pay (They are just sitting around, I'm used to paying them more quickly). On eval date she told SLP she is not paying bills due to feeling foggy. Lastly, pt states that she writes appointments on the calendar religiously and (reportedly) knew about today's appointment when she awoke this morning. Pt's working memory/mental math/number manipulation was WNL  01/29/25: Eval completed. N/A.  PATIENT EDUCATION: Education details: SLP suspects deficits are mostly linguistic in nature, possible goals for ST Person educated: Patient Education method: Explanation Education comprehension: verbalized understanding   GOALS: Goals reviewed with patient? Yes  SHORT TERM GOALS: Target date: 02/29/24  Pt will undergo cognitive testing and complete PROM in first 2 sessions Baseline: Goal status: met  2.  Pt will use trained compensations for anomic episodes in 5 minutes simple conversation x2 sessions Baseline:  Goal status: INITIAL  3.  Pt will complete SFA and /or VNEST with occasional min A x2 sessions Baseline: 02/13/24 Goal status: INITIAL  4.  Pt will use slowed rate to foster more fluid speech in sentence responses 80% of the time in 2 sessions Baseline: 02/19/24 Goal status: INITIAL   LONG TERM GOALS: Target date: 04/04/24  Pt will improve PROM Baseline:  Goal status: INITIAL  2.  Pt will use trained compensations for anomic episodes in 8 minutes simple conversation x2 sessions Baseline:  Goal status: INITIAL  3.  Pt will complete SFA and /or VNEST with rare min A x2 sessions Baseline:  Goal status: INITIAL  4.  Pt will use slowed rate to foster more fluid speech 80%  of the time in 8 minutes of simple conversation in 2 sessions Baseline:  Goal status: INITIAL   ASSESSMENT:  CLINICAL IMPRESSION: Patient is a 79 y.o. F who was seen today for speech/language in light of seizure and . metabolic encephalopathy with acute kidney injury. See treatment date above for today's date for further details on today's session. On date of eval Kati stated  I can't come up with the word I was sometimes, and sometimes it comes out strangely. Pt also endorsed repetitions in her speech, with reduced speech fluidity since November admission. Lastly she told SLP that her husband is handling monthly bills, something pt used to do prior to hospitalization in September. Today SLP completed cognitive evaluation but suspects errors for clock drawing were visuospatial in nature and not cognitive as verbally, pt demonstrated good planning, organization, and executive function. No goals will be added.   OBJECTIVE IMPAIRMENTS: include attention, memory, executive functioning, and aphasia. These impairments are limiting patient from managing medications, managing finances, household responsibilities, ADLs/IADLs, and effectively communicating at home and in community. Factors affecting potential to achieve goals and functional outcome are co-morbidities. Patient will benefit from skilled SLP services to address above impairments and improve overall function.  REHAB POTENTIAL: Good  PLAN:  SLP FREQUENCY: 2x/week  SLP DURATION: 8 weeks  PLANNED INTERVENTIONS: Language facilitation, Environmental controls, Cognitive reorganization, Internal/external aids, Functional tasks, Multimodal communication approach, SLP instruction and feedback, Compensatory strategies, Patient/family education, and [id number] Treatment of speech (30 or 45 min)     Shanisha Lech, CCC-SLP 02/19/2024, 11:51 AM      "

## 2024-02-19 NOTE — Therapy (Signed)
 " OUTPATIENT PHYSICAL THERAPY NEURO TREATMENT   Patient Name: [name] MRN: [id number] DOB:12-23-1945, 79 y.o., female Today's Date: 02/19/2024   PCP: Wendolyn Jenkins Jansky, MD  REFERRING PROVIDER: Maurice Sharlet RAMAN, PA-C  END OF SESSION:  PT End of Session - 02/19/24 1103     Visit Number 4    Number of Visits 17    Date for Recertification  03/26/24    Authorization Type Medicare/UMR/GEHA    Authorization - Number of Visits 60   combined   Progress Note Due on Visit 10    PT Start Time 1102    PT Stop Time 1145    PT Time Calculation (min) 43 min    Activity Tolerance Patient tolerated treatment well    Behavior During Therapy [facility] for tasks assessed/performed            Past Medical History:  Diagnosis Date   Allergy 1980's   Sulfa drugs and penecillin   Anxiety    Cataract 2021   Corrected   Chronic kidney disease 2024   Stage 3   Depression    Headache    SINUS   Hyperlipidemia    Hypertension 2021   Osteopenia    Osteopenia    Primary localized osteoarthritis of right knee    Seizures (HCC) 10/2023   Past Surgical History:  Procedure Laterality Date   BREAST EXCISIONAL BIOPSY Right    benign cyst removal    CARPAL TUNNEL RELEASE Right 2015   Ortho in [place]   CHOLECYSTECTOMY N/A 02/08/2022   Procedure: LAPAROSCOPIC CHOLECYSTECTOMY WITH INTRAOPERATIVE CHOLANGIOGRAM;  Surgeon: Debby Hila, MD;  Location: [facility];  Service: General;  Laterality: N/A;   EYE SURGERY  12/2019   cataracts   FRACTURE SURGERY  2016   Rt.  leg   JOINT REPLACEMENT  09/2015   Right knee   LASIK     MENISCUS REPAIR Right 2011   Orthopedic in [facility]   TOTAL KNEE ARTHROPLASTY Right 09/20/2015   TOTAL KNEE ARTHROPLASTY Right 09/20/2015   Procedure: TOTAL KNEE ARTHROPLASTY;  Surgeon: Lamar Millman, MD;  Location: [facility] OR;  Service: Orthopedics;  Laterality: Right;   Trigger Thumb Right 2015   Done in [place] at the same time as the carpal tunnel release   TUBAL LIGATION   1980   Patient Active Problem List   Diagnosis Date Noted   Anxiety state 12/26/2023   Debility 12/24/2023   Dizziness 12/16/2023   Seizure (HCC) 10/23/2023   Encephalopathy, metabolic 10/23/2023   AKI (acute kidney injury) 10/23/2023   Hypokalemia 10/23/2023   Seizure-like activity (HCC) 10/17/2023   Trigger finger, left ring finger 09/04/2023   DOE (dyspnea on exertion) 03/02/2023   Stage 3 chronic kidney disease (HCC) 03/02/2023   Left carpal tunnel syndrome 07/25/2022   Primary osteoarthritis of both first carpometacarpal joints 07/25/2022   Arthritis 07/04/2022   Anxiety 07/04/2022   Depression 07/04/2022   Primary osteoarthritis involving multiple joints 06/11/2022   Aortic atherosclerosis 02/05/2022   Facial lesion 09/30/2021   Bilateral hand pain 09/30/2021   Impingement syndrome, shoulder, left 02/16/2021   Impingement syndrome, shoulder, right 09/07/2020   MDD (recurrent major depressive disorder) in remission 09/02/2019   Generalized anxiety disorder 09/02/2019   Primary insomnia 08/16/2019   Post-menopausal 05/14/2017   Prediabetes 01/12/2017   Snoring 07/20/2016   Obsessive-compulsive personality trait 12/07/2015   Primary localized osteoarthritis of right knee    Essential hypertension 08/04/2015   Hyperlipidemia 08/04/2015   Osteopenia 08/04/2015  ONSET DATE: 12/16/23  REFERRING DIAG: G93.41 (ICD-10-CM) - Encephalopathy, metabolic  THERAPY DIAG:  Muscle weakness (generalized)  Other symptoms and signs involving the nervous system  Unsteadiness on feet  Other low back pain  Rationale for Evaluation and Treatment: Rehabilitation  SUBJECTIVE:                                                                                                                                                                                             SUBJECTIVE STATEMENT: Noting that feeling more dependent on rollator, apprehensive about walking in open space   Pt  accompanied by: self  PERTINENT HISTORY: Anxiety, CKD, depression, HA, HLD, HTN, seizures, R TKA, breast lumpectomy   PAIN:  Are you having pain? Yes: NPRS scale: 5/10 Pain location: B LB Pain description: discomfort, tight Aggravating factors: prolonged standing Relieving factors: heating pad  PRECAUTIONS: Fall and Other: L 4 and 5 ribs fx  RED FLAGS: None   WEIGHT BEARING RESTRICTIONS: No  FALLS: Has patient fallen in last 6 months? Yes. Number of falls 1  LIVING ENVIRONMENT: Lives with: lives with their spouse Lives in: House/apartment Stairs: 3 steps to enter back door with handrial on R; 1 story home Has following equipment at home: Single point cane, Walker - 4 wheeled, Shower bench, bed side commode, and Grab bars  PLOF: Independent, Vocation/Vocational requirements: retired, and Leisure: reading, watching sports  PATIENT GOALS: I'd like to get rid of this tight back and the walker.  OBJECTIVE:   TODAY'S TREATMENT: 02/19/23 Activity Comments  NU-step resistance intervals x 8 min Level 2 warm-up x 2 min 30 sec heavy resist; 60 sec light  L-stretch at counter 1x10   Bird dog at counter 1x10   Static multisensory balance Increase in LE tremors   Gait training Trials w/ cane and supervision for sequencing cues         TODAY'S TREATMENT: 02/13/2024 Activity Comments  Pre-walk vitals: 138/80, HR 82, 98%   6 MWT distance:  620 ft with 4WW RPE 6/10 Reports SOB and nearly sweating 2 seated rest breaks  Post walk vitals 164/73, HR 103 bpm O2 97%   Seated pelvic tilts-ant/post tilts, 5 reps Increased soreness with lumbar extension  Seated forward lean to upright posture, 2 x 3 reps Feels better with forward flexion  Supine trunk rotation Guarded motion-cues for smaller, rocking motion to avoid pain  Supine pelvic tilts 2 x 3-5 rep  Hooklying march 2 x 5  Cues to relax as she notes shoulder tension Good form  Standing stagger stance rocking For ant/post  rocking, change of positions to help back with  standing; pt reports tightness in low back     TODAY'S TREATMENT: 02/11/24 Activity Comments  Vitals at start of session  158/83 mmHg, 84bpm, 96% spO2   sitting pelvic tilts Cueing to reduce ROM to perform within pain-free range  prayer stretch with pball forward, then diagonals  cueing to reduce ROM to perform within pain-free range. Pt reports that feels good  LTR 20x Cues to avoid straining, move within comfortable ROM   Hooklying fig 4 stretch 30 To tolerance  Hooklying KTOS  stretch 30 To tolerance  Gait training with 4WW to assess back pain 67ft Pt reports the R hip feels more involved  Vitals  173/87 mmHg, 102 bpm, 96spO2   Instruction and practice on Diaphragmatic breathing    vitals 95% spO2, 88bpm, 141/80 mmHg        HOME EXERCISE PROGRAM Last updated: 02/11/24 Access Code: [email or web address] URL: [email or web address] Date: 02/11/2024 Prepared by: [facility] - Outpatient  Rehab - [facility]  Exercises - Seated Flexion Stretch with Swiss Ball  - 1 x daily - 5 x weekly - 2 sets - 10 reps - Seated Thoracic Flexion and Rotation with Swiss Ball  - 1 x daily - 5 x weekly - 2 sets - 10 reps - Supine Lower Trunk Rotation  - 1 x daily - 5 x weekly - 2 sets - 10 reps - Standing 'L' Stretch at Counter  - 1 x daily - 7 x weekly - 1-2 sets - 10 reps - Bird Dog on Counter  - 1 x daily - 3 x weekly - 3 sets - 10 reps  ALSO ADDED: sitting diaphragmatic breathing handout PRN   PATIENT EDUCATION: Education details: *02/13/2024:  Reviewed HEP and encouraged gentle, small, motions to avoid pain.  HEP with edu to stop if pain occurs, edu on benefits of diaphragmatic breathing on her vitals and pt's nervous energy that she complains of since hospitalization. Also advised to speak to PCP about this in case this could be a medication side effect  Person educated: Patient Education method: Explanation, Demonstration, Tactile  cues, Verbal cues, and Handouts Education comprehension: verbalized understanding and returned demonstration      Note: Objective measures were completed at Evaluation unless otherwise noted.  DIAGNOSTIC FINDINGS:  12/19/23 brain MRI:  Unchanged punctate focus of restricted diffusion in the right hippocampus as described on 12/16/2023 MRI.  12/17/23 MR head angio: Normal intracranial MRA. No large vessel occlusion or other vascular abnormality. No hemodynamically significant or correctable stenosis.  12/19/23 CT angio: Acute appearing mildly displaced left fourth and fifth lateral rib fractures. Small left-sided pleural effusion with dependent atelectasis in the left base.   COGNITION: Overall cognitive status: Within functional limits for tasks assessed   SENSATION: Pt denies N/T in UE/LEs  COORDINATION: Alternating pronation/supination: WNL B Alternating toe tap: WNL B Finger to nose: tremor, slightly slowed B   MUSCLE TONE: 1-2 bears clonus in B ankles  PALPATION: TTP over posterior R and L ribs, midline LB and B lumbar paraspinals. Very tight in B paraspinals and QL   POSTURE: rounded shoulders, forward head, increased thoracic kyphosis, and R shoulder depressed Observe slight B LE tremor in standing  LOWER EXTREMITY MMT:     MMT  (in sitting) Right Eval Left Eval  Hip flexion 4- 4  Hip extension    Hip abduction 4 4  Hip adduction 4 4  Hip internal rotation    Hip external rotation    Knee flexion 4+ 4+  Knee extension 4+ 4  Ankle dorsiflexion 4+ 4+  Ankle plantarflexion 4+ 4+  Ankle inversion    Ankle eversion     (Blank rows = not tested)  LOWER EXTREMITY ROM:    AROM Right Eval Left Eval  Hip flexion    Hip extension    Hip abduction    Hip adduction    Hip internal rotation    Hip external rotation    Knee flexion    Knee extension    Ankle dorsiflexion 29 15  Ankle plantarflexion    Ankle inversion    Ankle eversion    (Blank rows =  not tested)  GAIT: Findings: Assistive device utilized:Walker - 4 wheeled, Level of assistance: Modified independence, and Comments: difficulty maneuvering in tight spaces with walker  FUNCTIONAL TESTS:  5 times sit to stand: 13.65 sec with B armrests Berg: 39/56   [facility] PT Assessment - 01/30/24 0001       Standardized Balance Assessment   Standardized Balance Assessment Berg Balance Test      Berg Balance Test   Sit to Stand Able to stand without using hands and stabilize independently    Standing Unsupported Able to stand 2 minutes with supervision    Sitting with Back Unsupported but Feet Supported on Floor or Stool Able to sit safely and securely 2 minutes    Stand to Sit Sits safely with minimal use of hands    Transfers Able to transfer safely, minor use of hands    Standing Unsupported with Eyes Closed Able to stand 10 seconds with supervision    Standing Unsupported with Feet Together Needs help to attain position but able to stand for 30 seconds with feet together    From Standing, Reach Forward with Outstretched Arm Can reach forward >12 cm safely (5)    From Standing Position, Pick up Object from Floor Able to pick up shoe, needs supervision    From Standing Position, Turn to Look Behind Over each Shoulder Looks behind one side only/other side shows less weight shift    Turn 360 Degrees Able to turn 360 degrees safely one side only in 4 seconds or less    Standing Unsupported, Alternately Place Feet on Step/Stool Able to complete >2 steps/needs minimal assist    Standing Unsupported, One Foot in Front Able to take small step independently and hold 30 seconds    Standing on One Leg Tries to lift leg/unable to hold 3 seconds but remains standing independently    Total Score 39    Berg comment: pt required 2 sit breaks                                                                                                                                      TREATMENT DATE:  01/30/24    PATIENT EDUCATION: Education details: prognosis, POC, edu on exam findings and how they relate to functional  impairments, pt's goals  Person educated: Patient Education method: Explanation Education comprehension: verbalized understanding  HOME EXERCISE PROGRAM: Not yet initiated  GOALS: Goals reviewed with patient? Yes  SHORT TERM GOALS: Target date: 02/27/2024  Patient to be independent with initial HEP. Baseline: HEP initiated Goal status: IN PROGRESS    LONG TERM GOALS: Target date: 03/26/2024  Patient to be independent with advanced HEP. Baseline: Not yet initiated  Goal status: IN PROGRESS  Patient to demonstrate B LE strength >/=4+/5.  Baseline: See above Goal status: IN PROGRESS  6 minute walk to improve to at least 700 ft, for improved gait efficiency and endurance.  Baseline: 620 ft, 2 seated rest breaks Goal status: IN PROGRESS  Patient to ambulate 261ft without AD with good safety and stability.  Baseline: using 4WW Goal status: IN PROGRESS  Patient to demonstrate 5xSTS test in <15 sec without UE support in order to decrease risk of falls.  Baseline: 13 sec with B armrests Goal status: IN PROGRESS  Patient to score at least 45/56 on Berg in order to decrease risk of falls.  Baseline: 39 Goal status: IN PROGRESS  Patient to report tolerance for 30 minutes of chores without rest break without fatigue limiting.  Baseline: 5-7 minutes  Goal status: IN PROGRESS  ASSESSMENT:  CLINICAL IMPRESSION: Initiatde with NU-step for aerobic exercise to improve activity tolerance. Instructed in standing lumbar stretch and lumbar strength/stabilization exercise to reduce back pain.  Static balance with notable increase in BLE tremor with increased challenge or sensory demands, e.g. eyes closed, compliant surface, etc. Static standing with heavy dumbell hold bilat pt reports feeling more secure.  Pt with questions regarding tremor and therapist explained  tremor being a symptom of numerous conditions.  Discussed non-pharmacologic strategies for tremor and provided reference list/materials.  Gait training to improve safety and confidence with less restrictive AD per request and overall did quite well with the cane after initial practice and able to enact reciprocal steps thereafter.  Overall, limited in activity tolerance requiring seated rest periods every 3-5 min during standing/activity    OBJECTIVE IMPAIRMENTS: Abnormal gait, decreased activity tolerance, decreased balance, decreased coordination, decreased endurance, decreased mobility, difficulty walking, decreased strength, increased muscle spasms, postural dysfunction, and pain.   ACTIVITY LIMITATIONS: carrying, lifting, bending, sitting, standing, squatting, stairs, transfers, bathing, toileting, dressing, reach over head, hygiene/grooming, and locomotion level  PARTICIPATION LIMITATIONS: meal prep, cleaning, laundry, shopping, community activity, and church  PERSONAL FACTORS: Age, Fitness, Past/current experiences, Time since onset of injury/illness/exacerbation, and 3+ comorbidities: Anxiety, CKD, depression, HA, HLD, HTN, seizures, R TKA, breast lumpectomy  are also affecting patient's functional outcome.   REHAB POTENTIAL: Good  CLINICAL DECISION MAKING: Evolving/moderate complexity  EVALUATION COMPLEXITY: Moderate  PLAN:  PT FREQUENCY: 2x/week  PT DURATION: 8 weeks  PLANNED INTERVENTIONS: [id number]- PT Re-evaluation, 97750- Physical Performance Testing, 97110-Therapeutic exercises, 97530- Therapeutic activity, [id number]- Neuromuscular re-education, 97535- Self Care, [id number]- Manual therapy, [id number]- Gait training, [phone]- Canalith repositioning, [id number]- Aquatic Therapy, [phone] (1-2 muscles), [id number] (3+ muscles)- Dry Needling, Patient/Family education, Balance training, Stair training, Taping, Joint mobilization, Spinal mobilization, Vestibular training, DME instructions, Cryotherapy, and Moist  heat  PLAN FOR NEXT SESSION: Try to initiate HEP with standing balance, hip strength, gentle lumbar mobility while being mindful of L ribs fx     11:49 AM, 02/19/2024 M. Kelly Marquelle Musgrave, PT, DPT Physical Therapist-  Office Number: [phone]         "

## 2024-02-20 ENCOUNTER — Other Ambulatory Visit (HOSPITAL_COMMUNITY): Payer: Self-pay

## 2024-02-21 ENCOUNTER — Ambulatory Visit

## 2024-02-21 ENCOUNTER — Ambulatory Visit: Admitting: Gastroenterology

## 2024-02-24 ENCOUNTER — Other Ambulatory Visit (HOSPITAL_COMMUNITY): Payer: Self-pay

## 2024-02-24 ENCOUNTER — Other Ambulatory Visit: Payer: Self-pay

## 2024-02-25 ENCOUNTER — Telehealth: Payer: Self-pay | Admitting: Neurology

## 2024-02-25 ENCOUNTER — Other Ambulatory Visit (HOSPITAL_COMMUNITY): Payer: Self-pay

## 2024-02-25 ENCOUNTER — Other Ambulatory Visit: Payer: Self-pay

## 2024-02-25 NOTE — Telephone Encounter (Signed)
 She needs to be re-evaluated. Please add them to the cancellation list. Thanks

## 2024-02-25 NOTE — Telephone Encounter (Signed)
 Spouse states since last hospitalization in Nov pt's shaken has become more intense, spouse wants to know if there is something that can be called in to help pt.  Spouse also mentioned pt now has great difficulty feeding herself, please call .

## 2024-02-26 ENCOUNTER — Ambulatory Visit

## 2024-02-26 ENCOUNTER — Ambulatory Visit: Admitting: Occupational Therapy

## 2024-02-27 ENCOUNTER — Other Ambulatory Visit (HOSPITAL_COMMUNITY): Payer: Self-pay

## 2024-02-27 ENCOUNTER — Other Ambulatory Visit: Payer: Self-pay

## 2024-02-27 NOTE — Telephone Encounter (Signed)
 Pt husband called back stated that  Pt shakes has gotten worse and not sure id its the medication or her diginose . Pt husband will like to speak to nurse to inform what's going on . Did inform  Pt husband Pt husband that pt has been placed on waitlist . Pt husband would like to speak to someone

## 2024-02-28 ENCOUNTER — Encounter: Payer: Self-pay | Admitting: Physical Therapy

## 2024-02-28 ENCOUNTER — Ambulatory Visit: Admitting: Physical Therapy

## 2024-02-28 ENCOUNTER — Ambulatory Visit

## 2024-02-28 DIAGNOSIS — M6281 Muscle weakness (generalized): Secondary | ICD-10-CM

## 2024-02-28 DIAGNOSIS — R2681 Unsteadiness on feet: Secondary | ICD-10-CM

## 2024-02-28 DIAGNOSIS — R29818 Other symptoms and signs involving the nervous system: Secondary | ICD-10-CM

## 2024-02-28 DIAGNOSIS — R41841 Cognitive communication deficit: Secondary | ICD-10-CM

## 2024-02-28 DIAGNOSIS — R4701 Aphasia: Secondary | ICD-10-CM

## 2024-02-28 NOTE — Telephone Encounter (Signed)
 I called pt and LMVM that returning call.

## 2024-02-28 NOTE — Telephone Encounter (Signed)
 I called dropped call.  Called back.  Husband [phone] (M)  he stated that since pt on lacosamide  100mg  po bid since 01-16-2024 has noted hand, arm leg tremors. She had some slight sx of this back in 10/2023.  Feels like it is much worse since taking the lacosamide .  Her pcp did increase the  buspar  10mg  po bid but said to call neuro.  I told him that dr gregg out of the office this and next week, but will connect with [facility] for review.  It may be tomorrow or next week till I get back to them  he was ok with this.  She has had no seizures.

## 2024-02-28 NOTE — Therapy (Signed)
 " OUTPATIENT SPEECH LANGUAGE PATHOLOGY TREATMENT   Patient Name: [name] MRN: [id number] DOB:05/16/1945, 79 y.o., female Today's Date: 02/28/2024  PCP: Wendolyn Jenkins Jansky, MD REFERRING PROVIDER: Gregg Lek, MD  END OF SESSION:  End of Session - 02/28/24 1015     Visit Number 6    Number of Visits 17    Date for Recertification  04/04/24    SLP Start Time 0932    SLP Stop Time  1015    SLP Time Calculation (min) 43 min    Activity Tolerance Patient tolerated treatment well              Past Medical History:  Diagnosis Date   Allergy 1980's   Sulfa drugs and penecillin   Anxiety    Cataract 2021   Corrected   Chronic kidney disease 2024   Stage 3   Depression    Headache    SINUS   Hyperlipidemia    Hypertension 2021   Osteopenia    Osteopenia    Primary localized osteoarthritis of right knee    Seizures (HCC) 10/2023   Past Surgical History:  Procedure Laterality Date   BREAST EXCISIONAL BIOPSY Right    benign cyst removal    CARPAL TUNNEL RELEASE Right 2015   Ortho in [place]   CHOLECYSTECTOMY N/A 02/08/2022   Procedure: LAPAROSCOPIC CHOLECYSTECTOMY WITH INTRAOPERATIVE CHOLANGIOGRAM;  Surgeon: Debby Hila, MD;  Location: [facility];  Service: General;  Laterality: N/A;   EYE SURGERY  12/2019   cataracts   FRACTURE SURGERY  2016   Rt.  leg   JOINT REPLACEMENT  09/2015   Right knee   LASIK     MENISCUS REPAIR Right 2011   Orthopedic in [facility]   TOTAL KNEE ARTHROPLASTY Right 09/20/2015   TOTAL KNEE ARTHROPLASTY Right 09/20/2015   Procedure: TOTAL KNEE ARTHROPLASTY;  Surgeon: Lamar Millman, MD;  Location: [facility] OR;  Service: Orthopedics;  Laterality: Right;   Trigger Thumb Right 2015   Done in [place] at the same time as the carpal tunnel release   TUBAL LIGATION  1980   Patient Active Problem List   Diagnosis Date Noted   Anxiety state 12/26/2023   Debility 12/24/2023   Dizziness 12/16/2023   Seizure (HCC) 10/23/2023    Encephalopathy, metabolic 10/23/2023   AKI (acute kidney injury) 10/23/2023   Hypokalemia 10/23/2023   Seizure-like activity (HCC) 10/17/2023   Trigger finger, left ring finger 09/04/2023   DOE (dyspnea on exertion) 03/02/2023   Stage 3 chronic kidney disease (HCC) 03/02/2023   Left carpal tunnel syndrome 07/25/2022   Primary osteoarthritis of both first carpometacarpal joints 07/25/2022   Arthritis 07/04/2022   Anxiety 07/04/2022   Depression 07/04/2022   Primary osteoarthritis involving multiple joints 06/11/2022   Aortic atherosclerosis 02/05/2022   Facial lesion 09/30/2021   Bilateral hand pain 09/30/2021   Impingement syndrome, shoulder, left 02/16/2021   Impingement syndrome, shoulder, right 09/07/2020   MDD (recurrent major depressive disorder) in remission 09/02/2019   Generalized anxiety disorder 09/02/2019   Primary insomnia 08/16/2019   Post-menopausal 05/14/2017   Prediabetes 01/12/2017   Snoring 07/20/2016   Obsessive-compulsive personality trait 12/07/2015   Primary localized osteoarthritis of right knee    Essential hypertension 08/04/2015   Hyperlipidemia 08/04/2015   Osteopenia 08/04/2015    ONSET DATE: 12/16/23   REFERRING DIAG: R47.81 (ICD-10-CM) - Slurred speech R47.89 (ICD-10-CM) - Word finding difficulty  THERAPY DIAG:  Cognitive communication deficit  Aphasia  Rationale for Evaluation and Treatment: Rehabilitation  SUBJECTIVE:   SUBJECTIVE STATEMENT: I get anxious and it makes it (speech errors) worse  Pt accompanied by: self  PERTINENT HISTORY: PMH of HTN, HLD, osteopenia, seizures, and CKD. She was hospitalized in September with AMS. She presented to [facility] on 12/16/23 with falls and slurred speech. She had hit her head and reported nausea and diarrhea. CT head was negative for acute abnormalities. CXR revealed mild cardiomegaly with no acute infiltrates. C-spine, right knee, right rib, and left shoulder Xrs have been negative. UA was WNL. MRI  showed possible punctate stroke MRA negative for LVO. LTM EEG on 11/4 is negative for seizures. LTM EEG on 11/5 was also negative for seizures. Patient is being admitted to CIR for impaired mobility and ADLs.   PAIN:  Are you having pain? Yes: NPRS scale: 2/10 Pain location: lower back Pain description: it's so tight, dull Aggravating factors: walking or standing Relieving factors: heat  FALLS: Has patient fallen in last 6 months?  See PT evaluation for details   PATIENT GOALS: Get rid of this tremor. Get my talking back to normal  OBJECTIVE:  Note: Objective measures were completed at Evaluation unless otherwise noted.  DIAGNOSTIC FINDINGS:  SLE 02/23/23: Cognitive/ Linguistic. Pt presents with a mild expressive language deficit. Portions of the Western Aphasia Battery- Bedside Form completed revealing strengths in naming, auditory comprehension, and repetition. Her fluency was described as; some hesitations and word finding difficulty. Pt confirms changes to language upon hospitalization however, reports symptoms have largely resolved and she is back to baseline. Informally, some instances of phonemic paraphasias noted with pt able to self correct consistently. She also demonstrated some mild cluttering though it did not impact communication. Patient's overall cognitive functioning was not formally assessed but appeared [facility] during functional tasks. Patient also endorses that she feels she is at her cognitive baseline. She was oriented x4 and verbalized recent medical hx in detail. No follow up intervention warranted at this time.    PATIENT REPORTED OUTCOME MEASURES (PROM): Communication Effectiveness Survey: provided in first 1-2 sessions.                                                                                                                             TREATMENT DATE:  SFA-Semantic Feature Analysis  02/28/24: Pt states that she is still having difficulties with her speech  rate. She states that her racing thoughts were present at a smaller level prior to the seizure event; however, the racing thoughts are much worse now. SLP counseled pt about seeking a provider who specializes in mental health strategies for addressing potential underlying anxiety. Pt is agreeable to finding a mental health provider. SLP introduced diaphragmatic breathing as a strategies for optimizing speech rate. Pt utilzed diaphragmatic breathing with and without a straw to help with controlling inhalation/exhalation rate. Pt notes that she feels better using a straw and diaphragmatic breathing for making her feel more grounded. SLP required pt to read sentences to practice a  slow speech rate. Pt was 90% consistent with reading 10+ word sentences with a slow speech rate given occasional min A for regrounding herself with her diaphragmatic breath. In unstructured conversation, pt carried over slow rate 90-95% of the time given occasional min A. SLP targeted anomia and compensation of description through SFA. SLP provided education on use of SFA as rehabilitation tool to improve lexical representation with improved word finding, use as structure to aid in description in conversation, and as self-cueing strategy. Given visual target, pt able to name 3/3 on initial attempt. Pt able to generate average of 6/6 salient features given occasional min-A. Pt benefited from guided explanation for each category of descriptions. Plan is to continue word-finding and speech strategies for maximizing pt's communicative effectiveness.    02/19/24: SLP engaged pt in disucssion about what would be best for her to practice today. Pt, given her s statement, stated she would like to target slowed rate compensation today so that errors in her speech will occur less and thus her communication will not decline as frequently (due to decr'd frequency of anxiety from speaking errors). SLP modeled in sentences and pt repeated 90%  success. In multiple sentence generation tasks today pt maintained WNL speech fluidity 85-90% of the time. SLP provided rare min A for slowing rate and overarticulation. Homework for generating slower speech rate in sentences.   02/13/24: Today SLP educated pt about VNEST. Pt noted to incr anxious sx as session progressed and her handwriting got more illegible. She misspelled words more after 5 minutes and this appeared to incr her anxious sx. She then began to not follow SLP directions and demonstrated more frequent impulsive behavior. SLP cued pt to slow down her task completion and her writing with min-mod success, for a short time (30 seconds) and then pt req'd cues again.   02/11/24: Pt needs VNEST next session. I think I've gotten pretty good at halfway through the sentence finding a different word to use. SLP educated pt about SFA today and pt noted to rush through the task and appear to ignore SLP cues. Attention appeared min varying to moderately worse today than prior sessions. Pt made articulatory errors x5 today - more repetitious in nature than aphasic. She asked at end of session, Is there something I can do to work on this 'blahblhblhblah (pt imitation of her articulatory errors today)? SLP provided consonant-laden phrases and modeled slow and more deliberate articulation with these sentences for pt. She return demonstrated very well.   02/04/24: Pt with one phonemic paraphasia in today's session. SLUMS today. Farris scored 24/30 however SLP wonders if pt's visual acuity/spatial orientation was deficient for clock draw, as pt demonstrated [facility] explanations for verbal problem solving, organization/planning, and executive function (buying carpet, and if unexpected changes occurred in the buying experience). Pt indicated her husband fills the medbox but she is independent in taking meds. She is not writing checks for bills at this time due to her writing (reportedly), she is reminding husband to  pay (They are just sitting around, I'm used to paying them more quickly). On eval date she told SLP she is not paying bills due to feeling foggy. Lastly, pt states that she writes appointments on the calendar religiously and (reportedly) knew about today's appointment when she awoke this morning. Pt's working memory/mental math/number manipulation was WNL  01/29/25: Eval completed. N/A.  PATIENT EDUCATION: Education details: SLP suspects deficits are mostly linguistic in nature, possible goals for ST Person educated: Patient Education method:  Explanation Education comprehension: verbalized understanding   GOALS: Goals reviewed with patient? Yes  SHORT TERM GOALS: Target date: 02/29/24  Pt will undergo cognitive testing and complete PROM in first 2 sessions Baseline: Goal status: met  2.  Pt will use trained compensations for anomic episodes in 5 minutes simple conversation x2 sessions Baseline:  Goal status: INITIAL  3.  Pt will complete SFA and /or VNEST with occasional min A x2 sessions Baseline: 02/13/24 Goal status: INITIAL  4.  Pt will use slowed rate to foster more fluid speech in sentence responses 80% of the time in 2 sessions Baseline: 02/19/24 Goal status: INITIAL   LONG TERM GOALS: Target date: 04/04/24  Pt will improve PROM Baseline:  Goal status: INITIAL  2.  Pt will use trained compensations for anomic episodes in 8 minutes simple conversation x2 sessions Baseline:  Goal status: INITIAL  3.  Pt will complete SFA and /or VNEST with rare min A x2 sessions Baseline:  Goal status: INITIAL  4.  Pt will use slowed rate to foster more fluid speech 80% of the time in 8 minutes of simple conversation in 2 sessions Baseline:  Goal status: INITIAL   ASSESSMENT:  CLINICAL IMPRESSION: Patient is a 79 y.o. F who was seen today for speech/language in light of seizure and . metabolic encephalopathy with acute kidney injury. See treatment date above for today's  date for further details on today's session. On date of eval Talullah stated I can't come up with the word I was sometimes, and sometimes it comes out strangely. Pt also endorsed repetitions in her speech, with reduced speech fluidity since November admission. Lastly she told SLP that her husband is handling monthly bills, something pt used to do prior to hospitalization in September. Today SLP completed cognitive evaluation but suspects errors for clock drawing were visuospatial in nature and not cognitive as verbally, pt demonstrated good planning, organization, and executive function. No goals will be added.   OBJECTIVE IMPAIRMENTS: include attention, memory, executive functioning, and aphasia. These impairments are limiting patient from managing medications, managing finances, household responsibilities, ADLs/IADLs, and effectively communicating at home and in community. Factors affecting potential to achieve goals and functional outcome are co-morbidities. Patient will benefit from skilled SLP services to address above impairments and improve overall function.  REHAB POTENTIAL: Good  PLAN:  SLP FREQUENCY: 2x/week  SLP DURATION: 8 weeks  PLANNED INTERVENTIONS: Language facilitation, Environmental controls, Cognitive reorganization, Internal/external aids, Functional tasks, Multimodal communication approach, SLP instruction and feedback, Compensatory strategies, Patient/family education, and [id number] Treatment of speech (30 or 45 min)     Waddell Music, M.A., CF-SLP 02/28/2024, 10:18 AM      "

## 2024-02-28 NOTE — Therapy (Signed)
 " OUTPATIENT PHYSICAL THERAPY NEURO TREATMENT   Patient Name: [name] MRN: [id number] DOB:October 29, 1945, 79 y.o., female Today's Date: 02/28/2024   PCP: Wendolyn Jenkins Jansky, MD  REFERRING PROVIDER: Maurice Sharlet RAMAN, PA-C  END OF SESSION:  PT End of Session - 02/28/24 1012     Visit Number 5    Number of Visits 17    Date for Recertification  03/26/24    Authorization Type Medicare/UMR/GEHA    Authorization - Number of Visits 60   combined   Progress Note Due on Visit 10    PT Start Time 1017    PT Stop Time 1058    PT Time Calculation (min) 41 min    Activity Tolerance Patient tolerated treatment well    Behavior During Therapy [facility] for tasks assessed/performed            Past Medical History:  Diagnosis Date   Allergy 1980's   Sulfa drugs and penecillin   Anxiety    Cataract 2021   Corrected   Chronic kidney disease 2024   Stage 3   Depression    Headache    SINUS   Hyperlipidemia    Hypertension 2021   Osteopenia    Osteopenia    Primary localized osteoarthritis of right knee    Seizures (HCC) 10/2023   Past Surgical History:  Procedure Laterality Date   BREAST EXCISIONAL BIOPSY Right    benign cyst removal    CARPAL TUNNEL RELEASE Right 2015   Ortho in [place]   CHOLECYSTECTOMY N/A 02/08/2022   Procedure: LAPAROSCOPIC CHOLECYSTECTOMY WITH INTRAOPERATIVE CHOLANGIOGRAM;  Surgeon: Debby Hila, MD;  Location: [facility];  Service: General;  Laterality: N/A;   EYE SURGERY  12/2019   cataracts   FRACTURE SURGERY  2016   Rt.  leg   JOINT REPLACEMENT  09/2015   Right knee   LASIK     MENISCUS REPAIR Right 2011   Orthopedic in [facility]   TOTAL KNEE ARTHROPLASTY Right 09/20/2015   TOTAL KNEE ARTHROPLASTY Right 09/20/2015   Procedure: TOTAL KNEE ARTHROPLASTY;  Surgeon: Lamar Millman, MD;  Location: [facility];  Service: Orthopedics;  Laterality: Right;   Trigger Thumb Right 2015   Done in [place] at the same time as the carpal tunnel release   TUBAL  LIGATION  1980   Patient Active Problem List   Diagnosis Date Noted   Anxiety state 12/26/2023   Debility 12/24/2023   Dizziness 12/16/2023   Seizure (HCC) 10/23/2023   Encephalopathy, metabolic 10/23/2023   AKI (acute kidney injury) 10/23/2023   Hypokalemia 10/23/2023   Seizure-like activity (HCC) 10/17/2023   Trigger finger, left ring finger 09/04/2023   DOE (dyspnea on exertion) 03/02/2023   Stage 3 chronic kidney disease (HCC) 03/02/2023   Left carpal tunnel syndrome 07/25/2022   Primary osteoarthritis of both first carpometacarpal joints 07/25/2022   Arthritis 07/04/2022   Anxiety 07/04/2022   Depression 07/04/2022   Primary osteoarthritis involving multiple joints 06/11/2022   Aortic atherosclerosis 02/05/2022   Facial lesion 09/30/2021   Bilateral hand pain 09/30/2021   Impingement syndrome, shoulder, left 02/16/2021   Impingement syndrome, shoulder, right 09/07/2020   MDD (recurrent major depressive disorder) in remission 09/02/2019   Generalized anxiety disorder 09/02/2019   Primary insomnia 08/16/2019   Post-menopausal 05/14/2017   Prediabetes 01/12/2017   Snoring 07/20/2016   Obsessive-compulsive personality trait 12/07/2015   Primary localized osteoarthritis of right knee    Essential hypertension 08/04/2015   Hyperlipidemia 08/04/2015   Osteopenia 08/04/2015  ONSET DATE: 12/16/23  REFERRING DIAG: G93.41 (ICD-10-CM) - Encephalopathy, metabolic  THERAPY DIAG:  Muscle weakness (generalized)  Unsteadiness on feet  Other symptoms and signs involving the nervous system  Rationale for Evaluation and Treatment: Rehabilitation  SUBJECTIVE:                                                                                                                                                                                             SUBJECTIVE STATEMENT: The back still stays tight.  It limits me from standing too long.   Pt accompanied by: self  PERTINENT  HISTORY: Anxiety, CKD, depression, HA, HLD, HTN, seizures, R TKA, breast lumpectomy   PAIN:  Are you having pain? Yes: NPRS scale: 6-7/10 Pain location: Bilat LB Pain description: discomfort, tight Aggravating factors: prolonged standing Relieving factors: heating pad  PRECAUTIONS: Fall and Other: L 4 and 5 ribs fx  RED FLAGS: None   WEIGHT BEARING RESTRICTIONS: No  FALLS: Has patient fallen in last 6 months? Yes. Number of falls 1  LIVING ENVIRONMENT: Lives with: lives with their spouse Lives in: House/apartment Stairs: 3 steps to enter back door with handrial on R; 1 story home Has following equipment at home: Single point cane, Walker - 4 wheeled, Shower bench, bed side commode, and Grab bars  PLOF: Independent, Vocation/Vocational requirements: retired, and Leisure: reading, watching sports  PATIENT GOALS: I'd like to get rid of this tight back and the walker.  OBJECTIVE:   Pt asks about dry needling for her back. TODAY'S TREATMENT: 02/27/2024 Activity Comments  Manual therapy/STM to lumbar paraspinals with tightness/tenderness noted Follow up progression to tennis ball self stretch  Pt feels relief with both  Core stability seated: -alt UE lifts x 10 -alt leg lifts/marching x 10 reps -opposite/arm leg lifts x 5 reps -forward lean to upright posture 10 reps Cues for abdominal activation  Review of bird dog  Cues for abdominal activation and slowed pace-pt reports SOB and sweating  O2 sats 96%, HR 92 bpm   Sidestepping x 1 minute, Forward/back walk x 1 min Step taps to 4 step, x 1 min BUE>1 UE support Performed 2 sets Pt becomes more tremulous in BLEs with fatigue  *PT did adjust L handle of 4WW, as the handles were at different heights    Access Code: [email or web address] URL: [email or web address] Date: 02/28/2024 Prepared by: [facility] - Outpatient  Rehab - [facility]  Exercises - Seated Flexion Stretch with Swiss Ball  - 1 x daily - 5 x weekly -  2 sets - 10 reps - Seated Thoracic Flexion and Rotation with Swiss Mercer  -  1 x daily - 5 x weekly - 2 sets - 10 reps - Supine Lower Trunk Rotation  - 1 x daily - 5 x weekly - 2 sets - 10 reps - Standing 'L' Stretch at Counter  - 1 x daily - 7 x weekly - 1-2 sets - 10 reps - Bird Dog on Counter  - 1 x daily - 3 x weekly - 3 sets - 10 reps - Side Stepping with Counter Support  - 1 x daily - 4 x weekly - 3 sets - 1 min hold - Backward Walking with Counter Support  - 1 x daily - 4 x weekly - 1 sets - 1 min hold - Alternating Step Taps with Counter Support  - 1 x daily - 4 x weekly - 3 sets - 1 min hold         PATIENT EDUCATION: Education details: Updated HEP; briefly answered pt's questions about potential for dry needling; provided tennis ball for self-massage to lumbar paraspinals Person educated: Patient Education method: Explanation, Demonstration, Tactile cues, Verbal cues, and Handouts Education comprehension: verbalized understanding and returned demonstration      Note: Objective measures were completed at Evaluation unless otherwise noted.  DIAGNOSTIC FINDINGS:  12/19/23 brain MRI:  Unchanged punctate focus of restricted diffusion in the right hippocampus as described on 12/16/2023 MRI.  12/17/23 MR head angio: Normal intracranial MRA. No large vessel occlusion or other vascular abnormality. No hemodynamically significant or correctable stenosis.  12/19/23 CT angio: Acute appearing mildly displaced left fourth and fifth lateral rib fractures. Small left-sided pleural effusion with dependent atelectasis in the left base.   COGNITION: Overall cognitive status: Within functional limits for tasks assessed   SENSATION: Pt denies N/T in UE/LEs  COORDINATION: Alternating pronation/supination: WNL B Alternating toe tap: WNL B Finger to nose: tremor, slightly slowed B   MUSCLE TONE: 1-2 bears clonus in B ankles  PALPATION: TTP over posterior R and L ribs, midline LB and  B lumbar paraspinals. Very tight in B paraspinals and QL   POSTURE: rounded shoulders, forward head, increased thoracic kyphosis, and R shoulder depressed Observe slight B LE tremor in standing  LOWER EXTREMITY MMT:     MMT  (in sitting) Right Eval Left Eval  Hip flexion 4- 4  Hip extension    Hip abduction 4 4  Hip adduction 4 4  Hip internal rotation    Hip external rotation    Knee flexion 4+ 4+  Knee extension 4+ 4  Ankle dorsiflexion 4+ 4+  Ankle plantarflexion 4+ 4+  Ankle inversion    Ankle eversion     (Blank rows = not tested)  LOWER EXTREMITY ROM:    AROM Right Eval Left Eval  Hip flexion    Hip extension    Hip abduction    Hip adduction    Hip internal rotation    Hip external rotation    Knee flexion    Knee extension    Ankle dorsiflexion 29 15  Ankle plantarflexion    Ankle inversion    Ankle eversion    (Blank rows = not tested)  GAIT: Findings: Assistive device utilized:Walker - 4 wheeled, Level of assistance: Modified independence, and Comments: difficulty maneuvering in tight spaces with walker  FUNCTIONAL TESTS:  5 times sit to stand: 13.65 sec with B armrests BergBETHA PLENTY   [facility] PT Assessment - 01/30/24 0001       Standardized Balance Assessment   Standardized Balance Assessment Berg Balance Test  Berg Balance Test   Sit to Stand Able to stand without using hands and stabilize independently    Standing Unsupported Able to stand 2 minutes with supervision    Sitting with Back Unsupported but Feet Supported on Floor or Stool Able to sit safely and securely 2 minutes    Stand to Sit Sits safely with minimal use of hands    Transfers Able to transfer safely, minor use of hands    Standing Unsupported with Eyes Closed Able to stand 10 seconds with supervision    Standing Unsupported with Feet Together Needs help to attain position but able to stand for 30 seconds with feet together    From Standing, Reach Forward with Outstretched  Arm Can reach forward >12 cm safely (5)    From Standing Position, Pick up Object from Floor Able to pick up shoe, needs supervision    From Standing Position, Turn to Look Behind Over each Shoulder Looks behind one side only/other side shows less weight shift    Turn 360 Degrees Able to turn 360 degrees safely one side only in 4 seconds or less    Standing Unsupported, Alternately Place Feet on Step/Stool Able to complete >2 steps/needs minimal assist    Standing Unsupported, One Foot in Front Able to take small step independently and hold 30 seconds    Standing on One Leg Tries to lift leg/unable to hold 3 seconds but remains standing independently    Total Score 39    Berg comment: pt required 2 sit breaks                                                                                                                                      TREATMENT DATE: 01/30/24    PATIENT EDUCATION: Education details: prognosis, POC, edu on exam findings and how they relate to functional impairments, pt's goals  Person educated: Patient Education method: Explanation Education comprehension: verbalized understanding  HOME EXERCISE PROGRAM: Not yet initiated  GOALS: Goals reviewed with patient? Yes  SHORT TERM GOALS: Target date: 02/27/2024  Patient to be independent with initial HEP. Baseline: HEP initiated Goal status: MET 02/28/2024    LONG TERM GOALS: Target date: 03/26/2024  Patient to be independent with advanced HEP. Baseline: Not yet initiated  Goal status: IN PROGRESS  Patient to demonstrate B LE strength >/=4+/5.  Baseline: See above Goal status: IN PROGRESS  6 minute walk to improve to at least 700 ft, for improved gait efficiency and endurance.  Baseline: 620 ft, 2 seated rest breaks Goal status: IN PROGRESS  Patient to ambulate 223ft without AD with good safety and stability.  Baseline: using 4WW Goal status: IN PROGRESS  Patient to demonstrate 5xSTS test in <15 sec  without UE support in order to decrease risk of falls.  Baseline: 13 sec with B armrests Goal status: IN PROGRESS  Patient to score at least 45/56 on Berg  in order to decrease risk of falls.  Baseline: 39 Goal status: IN PROGRESS  Patient to report tolerance for 30 minutes of chores without rest break without fatigue limiting.  Baseline: 5-7 minutes  Goal status: IN PROGRESS  ASSESSMENT:  CLINICAL IMPRESSION: Pt presents today with continued reports of discomfort and stiffness in low back.  She continues to report stretches in her HEP feel good, but standing is limited due to fatigue and pain in low back. Skilled PT session focused on core stability and abdominal activation exercises sitting and standing, STM to lumbar paraspinals, with relief noted (and education on self-massage using tennis balls).  Also initiated counter balance exercises as part of HEP.  She has increased tremulous movements in BLEs with fatigue, but is able to resume after brief rest.  She has met STG 1.  She notes some improvement in back discomfort at end of session.  Pt will continue to benefit from skilled PT towards goals for improved functional mobility and decreased fall risk.    OBJECTIVE IMPAIRMENTS: Abnormal gait, decreased activity tolerance, decreased balance, decreased coordination, decreased endurance, decreased mobility, difficulty walking, decreased strength, increased muscle spasms, postural dysfunction, and pain.   ACTIVITY LIMITATIONS: carrying, lifting, bending, sitting, standing, squatting, stairs, transfers, bathing, toileting, dressing, reach over head, hygiene/grooming, and locomotion level  PARTICIPATION LIMITATIONS: meal prep, cleaning, laundry, shopping, community activity, and church  PERSONAL FACTORS: Age, Fitness, Past/current experiences, Time since onset of injury/illness/exacerbation, and 3+ comorbidities: Anxiety, CKD, depression, HA, HLD, HTN, seizures, R TKA, breast lumpectomy  are  also affecting patient's functional outcome.   REHAB POTENTIAL: Good  CLINICAL DECISION MAKING: Evolving/moderate complexity  EVALUATION COMPLEXITY: Moderate  PLAN:  PT FREQUENCY: 2x/week  PT DURATION: 8 weeks  PLANNED INTERVENTIONS: 97164- PT Re-evaluation, 97750- Physical Performance Testing, 97110-Therapeutic exercises, 97530- Therapeutic activity, [id number]- Neuromuscular re-education, 97535- Self Care, [id number]- Manual therapy, [id number]- Gait training, [phone]- Canalith repositioning, [id number]- Aquatic Therapy, [id number] (1-2 muscles), 20561 (3+ muscles)- Dry Needling, Patient/Family education, Balance training, Stair training, Taping, Joint mobilization, Spinal mobilization, Vestibular training, DME instructions, Cryotherapy, and Moist heat  PLAN FOR NEXT SESSION: Review and progress HEP for standing balance, hip strength, gentle lumbar mobility while being mindful of L ribs fx.  May discuss dry needling (per patient's request)    Greig Anon, PT 02/28/24 12:00 PM Phone: [phone] Fax: [phone]  [facility] at [facility] [address], [address] [place], [place] [address] Phone # [phone] Fax # [phone]        "

## 2024-02-29 ENCOUNTER — Ambulatory Visit: Admitting: Family Medicine

## 2024-02-29 NOTE — Telephone Encounter (Signed)
 I called husband.  I relayed message to him.  Pt is not sleeping well, she is hydrated, no caffiene or alcohol, no change in effexor , she does take ambien .   I relayed that she is on wailtist.  Will forward message to Dr. Gregg.

## 2024-02-29 NOTE — Telephone Encounter (Signed)
 It looks like the Vimpat  was increased 2 months ago, when she was hospitalized.  Since she has been on it for about 2 months, I think it is okay for Dr. Gregg to address the Vimpat  dosing upon his return.  Other medications or situations that can worsen tremors include sleep deprivation, dehydration, caffeine use, and alcohol consumption.  Please call patient and inquire: Is she drinking any caffeine?  If so, if she limiting to 1 serving per day or less? Is she drinking any alcohol, if so, I recommend she abstain from alcohol altogether. Is she drinking enough water, I recommend 6 to 8 cups of water per day, 8 ounce size each. Is she getting 7 to 8 hours of sleep on average?   Of note, she is taking Effexor  which can worsen tremors in susceptible patients or cause tremors.  Has the dose been increased recently?   I see that she also has a prescription for zolpidem  for insomnia.  Is she taking this still?   For now, I recommend that she continue with the Vimpat  at the current dose, as the hospital increased it after the level was found to be subtherapeutic.

## 2024-03-02 ENCOUNTER — Encounter: Payer: Self-pay | Admitting: Family Medicine

## 2024-03-03 ENCOUNTER — Other Ambulatory Visit (HOSPITAL_COMMUNITY): Payer: Self-pay

## 2024-03-03 ENCOUNTER — Other Ambulatory Visit: Payer: Self-pay | Admitting: Family Medicine

## 2024-03-03 DIAGNOSIS — F411 Generalized anxiety disorder: Secondary | ICD-10-CM

## 2024-03-03 MED ORDER — BUSPIRONE HCL 10 MG PO TABS
20.0000 mg | ORAL_TABLET | Freq: Two times a day (BID) | ORAL | 1 refills | Status: AC
  Filled 2024-03-03 – 2024-03-04 (×2): qty 120, 30d supply, fill #0

## 2024-03-04 ENCOUNTER — Ambulatory Visit

## 2024-03-04 ENCOUNTER — Encounter: Payer: Self-pay | Admitting: Dermatology

## 2024-03-04 ENCOUNTER — Other Ambulatory Visit: Payer: Self-pay

## 2024-03-04 ENCOUNTER — Ambulatory Visit: Admitting: Occupational Therapy

## 2024-03-04 ENCOUNTER — Ambulatory Visit: Admitting: Dermatology

## 2024-03-04 DIAGNOSIS — R278 Other lack of coordination: Secondary | ICD-10-CM

## 2024-03-04 DIAGNOSIS — M5459 Other low back pain: Secondary | ICD-10-CM

## 2024-03-04 DIAGNOSIS — R29818 Other symptoms and signs involving the nervous system: Secondary | ICD-10-CM

## 2024-03-04 DIAGNOSIS — R41841 Cognitive communication deficit: Secondary | ICD-10-CM

## 2024-03-04 DIAGNOSIS — L821 Other seborrheic keratosis: Secondary | ICD-10-CM

## 2024-03-04 DIAGNOSIS — R4701 Aphasia: Secondary | ICD-10-CM

## 2024-03-04 DIAGNOSIS — R251 Tremor, unspecified: Secondary | ICD-10-CM

## 2024-03-04 DIAGNOSIS — L820 Inflamed seborrheic keratosis: Secondary | ICD-10-CM

## 2024-03-04 DIAGNOSIS — M6281 Muscle weakness (generalized): Secondary | ICD-10-CM

## 2024-03-04 DIAGNOSIS — R2681 Unsteadiness on feet: Secondary | ICD-10-CM

## 2024-03-04 NOTE — Patient Instructions (Addendum)

## 2024-03-04 NOTE — Progress Notes (Signed)
" ° °  New Patient Visit  History of Present Illness [name] is a 79 year old female who presents with skin lesions on her neck and leg.  She has a lesion on her neck described as a 'fluffy little thing' that has been present for at least a year. The lesion has been increasing in size and causing discomfort, leading to her desire for removal. She has had spots frozen before.  Additionally, she has noticed a brown spot on her leg, which she associates with a previous skin issue from 1980.  Pt has growth on neck she'd like to have evaluated that has been present around a year. She has no hx of skin cancer  The following portions of the chart were reviewed this encounter and updated as appropriate: medications, allergies, medical history  Review of Systems:  No other skin or systemic complaints except as noted in HPI or Assessment and Plan.  Objective  Well appearing patient in no apparent distress; mood and affect are within normal limits.  A focused examination was performed of the following areas: neck Left leg     Relevant exam findings are noted in the Assessment and Plan. Neck - Anterior Inflamed stuck on papule  Assessment & Plan   SEBORRHEIC KERATOSIS- left leg - Stuck-on, waxy, tan-brown papules and/or plaques  - Benign-appearing - Discussed benign etiology and prognosis. - Observe - Call for any changes INFLAMED SEBORRHEIC KERATOSIS Neck - Anterior - Destruction of lesion - Neck - Anterior Complexity: simple   Destruction method: cryotherapy   Post-procedure details: wound care instructions given    SEBORRHEIC KERATOSIS    Return for TBSE with brenda.  I, Darice Smock, CMA, am acting as scribe for RUFUS CHRISTELLA HOLY, MD.   Documentation: I have reviewed the above documentation for accuracy and completeness, and I agree with the above.  RUFUS CHRISTELLA HOLY, MD    "

## 2024-03-04 NOTE — Therapy (Signed)
 " OUTPATIENT SPEECH LANGUAGE PATHOLOGY TREATMENT   Patient Name: [name] MRN: [id number] DOB:04-02-1945, 79 y.o., female Today's Date: 03/04/2024  PCP: Wendolyn Jenkins Jansky, MD REFERRING PROVIDER: Gregg Lek, MD  END OF SESSION:  End of Session - 03/04/24 0935     Visit Number 7    Number of Visits 17    Date for Recertification  04/04/24    SLP Start Time 0936    SLP Stop Time  1015    SLP Time Calculation (min) 39 min    Activity Tolerance Patient tolerated treatment well             Past Medical History:  Diagnosis Date   Allergy 1980's   Sulfa drugs and penecillin   Anxiety    Cataract 2021   Corrected   Chronic kidney disease 2024   Stage 3   Depression    Headache    SINUS   Hyperlipidemia    Hypertension 2021   Osteopenia    Osteopenia    Primary localized osteoarthritis of right knee    Seizures (HCC) 10/2023   Past Surgical History:  Procedure Laterality Date   BREAST EXCISIONAL BIOPSY Right    benign cyst removal    CARPAL TUNNEL RELEASE Right 2015   Ortho in [place]   CHOLECYSTECTOMY N/A 02/08/2022   Procedure: LAPAROSCOPIC CHOLECYSTECTOMY WITH INTRAOPERATIVE CHOLANGIOGRAM;  Surgeon: Debby Hila, MD;  Location: [facility];  Service: General;  Laterality: N/A;   EYE SURGERY  12/2019   cataracts   FRACTURE SURGERY  2016   Rt.  leg   JOINT REPLACEMENT  09/2015   Right knee   LASIK     MENISCUS REPAIR Right 2011   Orthopedic in [facility]   TOTAL KNEE ARTHROPLASTY Right 09/20/2015   TOTAL KNEE ARTHROPLASTY Right 09/20/2015   Procedure: TOTAL KNEE ARTHROPLASTY;  Surgeon: Lamar Millman, MD;  Location: [facility] OR;  Service: Orthopedics;  Laterality: Right;   Trigger Thumb Right 2015   Done in [place] at the same time as the carpal tunnel release   TUBAL LIGATION  1980   Patient Active Problem List   Diagnosis Date Noted   Anxiety state 12/26/2023   Debility 12/24/2023   Dizziness 12/16/2023   Seizure (HCC) 10/23/2023   Encephalopathy,  metabolic 10/23/2023   AKI (acute kidney injury) 10/23/2023   Hypokalemia 10/23/2023   Seizure-like activity (HCC) 10/17/2023   Trigger finger, left ring finger 09/04/2023   DOE (dyspnea on exertion) 03/02/2023   Stage 3 chronic kidney disease (HCC) 03/02/2023   Left carpal tunnel syndrome 07/25/2022   Primary osteoarthritis of both first carpometacarpal joints 07/25/2022   Arthritis 07/04/2022   Anxiety 07/04/2022   Depression 07/04/2022   Primary osteoarthritis involving multiple joints 06/11/2022   Aortic atherosclerosis 02/05/2022   Facial lesion 09/30/2021   Bilateral hand pain 09/30/2021   Impingement syndrome, shoulder, left 02/16/2021   Impingement syndrome, shoulder, right 09/07/2020   MDD (recurrent major depressive disorder) in remission 09/02/2019   Generalized anxiety disorder 09/02/2019   Primary insomnia 08/16/2019   Post-menopausal 05/14/2017   Prediabetes 01/12/2017   Snoring 07/20/2016   Obsessive-compulsive personality trait 12/07/2015   Primary localized osteoarthritis of right knee    Essential hypertension 08/04/2015   Hyperlipidemia 08/04/2015   Osteopenia 08/04/2015    ONSET DATE: 12/16/23   REFERRING DIAG: R47.81 (ICD-10-CM) - Slurred speech R47.89 (ICD-10-CM) - Word finding difficulty  THERAPY DIAG:  Cognitive communication deficit  Aphasia  Rationale for Evaluation and Treatment: Rehabilitation  SUBJECTIVE:   SUBJECTIVE STATEMENT: I can't think about enunciating and what I want to say.  Pt accompanied by: self  PERTINENT HISTORY: PMH of HTN, HLD, osteopenia, seizures, and CKD. She was hospitalized in September with AMS. She presented to [facility] on 12/16/23 with falls and slurred speech. She had hit her head and reported nausea and diarrhea. CT head was negative for acute abnormalities. CXR revealed mild cardiomegaly with no acute infiltrates. C-spine, right knee, right rib, and left shoulder Xrs have been negative. UA was WNL. MRI showed  possible punctate stroke MRA negative for LVO. LTM EEG on 11/4 is negative for seizures. LTM EEG on 11/5 was also negative for seizures. Patient is being admitted to CIR for impaired mobility and ADLs.   PAIN:  Are you having pain? Yes: NPRS scale: 2/10 Pain location: lower back Pain description: it's so tight, dull Aggravating factors: walking or standing Relieving factors: heat  FALLS: Has patient fallen in last 6 months?  See PT evaluation for details   PATIENT GOALS: Get rid of this tremor. Get my talking back to normal  OBJECTIVE:  Note: Objective measures were completed at Evaluation unless otherwise noted.  DIAGNOSTIC FINDINGS:  SLE 02/23/23: Cognitive/ Linguistic. Pt presents with a mild expressive language deficit. Portions of the Western Aphasia Battery- Bedside Form completed revealing strengths in naming, auditory comprehension, and repetition. Her fluency was described as; some hesitations and word finding difficulty. Pt confirms changes to language upon hospitalization however, reports symptoms have largely resolved and she is back to baseline. Informally, some instances of phonemic paraphasias noted with pt able to self correct consistently. She also demonstrated some mild cluttering though it did not impact communication. Patient's overall cognitive functioning was not formally assessed but appeared [facility] during functional tasks. Patient also endorses that she feels she is at her cognitive baseline. She was oriented x4 and verbalized recent medical hx in detail. No follow up intervention warranted at this time.    PATIENT REPORTED OUTCOME MEASURES (PROM): Communication Effectiveness Survey: provided in first 1-2 sessions.                                                                                                                             TREATMENT DATE:  SFA-Semantic Feature Analysis  03/04/24:SLP educated pt about energy conservation given her acknowledgement  that her speech worsens with fatigue. SLP worked with pt at decreasing her speech rate with overarticulation in sentences. In reading 9 word sentences Layonna was 85% successful in achieving a slower speech rate and minimizing/eliminating less-fluent speech. SLP moved to spontaneous sentence responses and the first 5 responses were almost WNL, and pt's overall success declined from that point. She said she was more anxious and SLP cued her to take some breaths and count to 10 which pt states normally helps her calm down. Pt's responses after this were better but after 4 responses began to decline again. SLP helped pt with calming down by encouraging  breathing.  02/19/24: SLP engaged pt in disucssion about what would be best for her to practice today. Pt, given her s statement, stated she would like to target slowed rate compensation today so that errors in her speech will occur less and thus her communication will not decline as frequently (due to decr'd frequency of anxiety from speaking errors). SLP modeled in sentences and pt repeated 90% success. In multiple sentence generation tasks today pt maintained WNL speech fluidity 85-90% of the time. SLP provided rare min A for slowing rate and overarticulation. Homework for generating slower speech rate in sentences.   02/13/24: Today SLP educated pt about VNEST. Pt noted to incr anxious sx as session progressed and her handwriting got more illegible. She misspelled words more after 5 minutes and this appeared to incr her anxious sx. She then began to not follow SLP directions and demonstrated more frequent impulsive behavior. SLP cued pt to slow down her task completion and her writing with min-mod success, for a short time (30 seconds) and then pt req'd cues again.   02/11/24: Pt needs VNEST next session. I think I've gotten pretty good at halfway through the sentence finding a different word to use. SLP educated pt about SFA today and pt noted to rush through  the task and appear to ignore SLP cues. Attention appeared min varying to moderately worse today than prior sessions. Pt made articulatory errors x5 today - more repetitious in nature than aphasic. She asked at end of session, Is there something I can do to work on this 'blahblhblhblah (pt imitation of her articulatory errors today)? SLP provided consonant-laden phrases and modeled slow and more deliberate articulation with these sentences for pt. She return demonstrated very well.   02/04/24: Pt with one phonemic paraphasia in today's session. SLUMS today. Jahzara scored 24/30 however SLP wonders if pt's visual acuity/spatial orientation was deficient for clock draw, as pt demonstrated [facility] explanations for verbal problem solving, organization/planning, and executive function (buying carpet, and if unexpected changes occurred in the buying experience). Pt indicated her husband fills the medbox but she is independent in taking meds. She is not writing checks for bills at this time due to her writing (reportedly), she is reminding husband to pay (They are just sitting around, I'm used to paying them more quickly). On eval date she told SLP she is not paying bills due to feeling foggy. Lastly, pt states that she writes appointments on the calendar religiously and (reportedly) knew about today's appointment when she awoke this morning. Pt's working memory/mental math/number manipulation was WNL  01/29/25: Eval completed. N/A.  PATIENT EDUCATION: Education details: SLP suspects deficits are mostly linguistic in nature, possible goals for ST Person educated: Patient Education method: Explanation Education comprehension: verbalized understanding   GOALS: Goals reviewed with patient? Yes  SHORT TERM GOALS: Target date:  03/07/24 (Due to visit number)  Pt will undergo cognitive testing and complete PROM in first 2 sessions Baseline: Goal status: met  2.  Pt will use trained compensations for  anomic episodes in 5 minutes simple conversation x2 sessions Baseline:  Goal status: INITIAL  3.  Pt will complete SFA and /or VNEST with occasional min A x2 sessions Baseline: 02/13/24 Goal status: INITIAL  4.  Pt will use slowed rate to foster more fluid speech in sentence responses 80% of the time in 2 sessions Baseline: 02/19/24 Goal status: INITIAL   LONG TERM GOALS: Target date: 04/04/24  Pt will improve PROM Baseline:  Goal status:  INITIAL  2.  Pt will use trained compensations for anomic episodes in 8 minutes simple conversation x2 sessions Baseline:  Goal status: INITIAL  3.  Pt will complete SFA and /or VNEST with rare min A x2 sessions Baseline:  Goal status: INITIAL  4.  Pt will use slowed rate to foster more fluid speech 80% of the time in 8 minutes of simple conversation in 2 sessions Baseline:  Goal status: INITIAL   ASSESSMENT:  CLINICAL IMPRESSION: Modifed STG date due to visit number. Patient is a 79 y.o. F who was seen today for speech/language in light of seizure and . metabolic encephalopathy with acute kidney injury. See treatment date above for today's date for further details on today's session. On date of eval Stefhanie stated I can't come up with the word I was sometimes, and sometimes it comes out strangely. Pt also endorsed repetitions in her speech, with reduced speech fluidity since November admission. Lastly she told SLP that her husband is handling monthly bills, something pt used to do prior to hospitalization in September. Today SLP completed cognitive evaluation but suspects errors for clock drawing were visuospatial in nature and not cognitive as verbally, pt demonstrated good planning, organization, and executive function. No goals will be added.   OBJECTIVE IMPAIRMENTS: include attention, memory, executive functioning, and aphasia. These impairments are limiting patient from managing medications, managing finances, household responsibilities,  ADLs/IADLs, and effectively communicating at home and in community. Factors affecting potential to achieve goals and functional outcome are co-morbidities. Patient will benefit from skilled SLP services to address above impairments and improve overall function.  REHAB POTENTIAL: Good  PLAN:  SLP FREQUENCY: 2x/week  SLP DURATION: 8 weeks  PLANNED INTERVENTIONS: Language facilitation, Environmental controls, Cognitive reorganization, Internal/external aids, Functional tasks, Multimodal communication approach, SLP instruction and feedback, Compensatory strategies, Patient/family education, and [id number] Treatment of speech (30 or 45 min)     Zacary Bauer, CCC-SLP 03/04/2024, 9:36 AM      "

## 2024-03-04 NOTE — Therapy (Signed)
 " OUTPATIENT PHYSICAL THERAPY NEURO TREATMENT   Patient Name: [name] MRN: [id number] DOB:April 27, 1945, 79 y.o., female Today's Date: 03/04/2024   PCP: Wendolyn Jenkins Jansky, MD  REFERRING PROVIDER: Maurice Sharlet RAMAN, PA-C  END OF SESSION:  PT End of Session - 03/04/24 1039     Visit Number 6    Number of Visits 17    Date for Recertification  03/26/24    Authorization Type Medicare/UMR/GEHA    Authorization - Number of Visits 60   combined   Progress Note Due on Visit 10    PT Start Time 1100    PT Stop Time 1145    PT Time Calculation (min) 45 min    Activity Tolerance Patient tolerated treatment well    Behavior During Therapy [facility] for tasks assessed/performed            Past Medical History:  Diagnosis Date   Allergy 1980's   Sulfa drugs and penecillin   Anxiety    Cataract 2021   Corrected   Chronic kidney disease 2024   Stage 3   Depression    Headache    SINUS   Hyperlipidemia    Hypertension 2021   Osteopenia    Osteopenia    Primary localized osteoarthritis of right knee    Seizures (HCC) 10/2023   Past Surgical History:  Procedure Laterality Date   BREAST EXCISIONAL BIOPSY Right    benign cyst removal    CARPAL TUNNEL RELEASE Right 2015   Ortho in [place]   CHOLECYSTECTOMY N/A 02/08/2022   Procedure: LAPAROSCOPIC CHOLECYSTECTOMY WITH INTRAOPERATIVE CHOLANGIOGRAM;  Surgeon: Debby Hila, MD;  Location: [facility];  Service: General;  Laterality: N/A;   EYE SURGERY  12/2019   cataracts   FRACTURE SURGERY  2016   Rt.  leg   JOINT REPLACEMENT  09/2015   Right knee   LASIK     MENISCUS REPAIR Right 2011   [facility]thopedic in [facility]   TOTAL KNEE ARTHROPLASTY Right 09/20/2015   TOTAL KNEE ARTHROPLASTY Right 09/20/2015   Procedure: TOTAL KNEE ARTHROPLASTY;  Surgeon: Lamar Millman, MD;  Location: [facility] OR;  Service: Orthopedics;  Laterality: Right;   Trigger Thumb Right 2015   Done in [place] at the same time as the carpal tunnel release   TUBAL  LIGATION  1980   Patient Active Problem List   Diagnosis Date Noted   Anxiety state 12/26/2023   Debility 12/24/2023   Dizziness 12/16/2023   Seizure (HCC) 10/23/2023   Encephalopathy, metabolic 10/23/2023   AKI (acute kidney injury) 10/23/2023   Hypokalemia 10/23/2023   Seizure-like activity (HCC) 10/17/2023   Trigger finger, left ring finger 09/04/2023   DOE (dyspnea on exertion) 03/02/2023   Stage 3 chronic kidney disease (HCC) 03/02/2023   Left carpal tunnel syndrome 07/25/2022   Primary osteoarthritis of both first carpometacarpal joints 07/25/2022   Arthritis 07/04/2022   Anxiety 07/04/2022   Depression 07/04/2022   Primary osteoarthritis involving multiple joints 06/11/2022   Aortic atherosclerosis 02/05/2022   Facial lesion 09/30/2021   Bilateral hand pain 09/30/2021   Impingement syndrome, shoulder, left 02/16/2021   Impingement syndrome, shoulder, right 09/07/2020   MDD (recurrent major depressive disorder) in remission 09/02/2019   Generalized anxiety disorder 09/02/2019   Primary insomnia 08/16/2019   Post-menopausal 05/14/2017   Prediabetes 01/12/2017   Snoring 07/20/2016   Obsessive-compulsive personality trait 12/07/2015   Primary localized osteoarthritis of right knee    Essential hypertension 08/04/2015   Hyperlipidemia 08/04/2015   Osteopenia 08/04/2015  ONSET DATE: 12/16/23  REFERRING DIAG: G93.41 (ICD-10-CM) - Encephalopathy, metabolic  THERAPY DIAG:  Muscle weakness (generalized)  Unsteadiness on feet  Other symptoms and signs involving the nervous system  Other low back pain  Rationale for Evaluation and Treatment: Rehabilitation  SUBJECTIVE:                                                                                                                                                                                             SUBJECTIVE STATEMENT: Doing ok, stayed up too late last night   Pt accompanied by: self  PERTINENT HISTORY:  Anxiety, CKD, depression, HA, HLD, HTN, seizures, R TKA, breast lumpectomy   PAIN:  Are you having pain? Yes: NPRS scale: 6/10 Pain location: B LB Pain description: discomfort, tight Aggravating factors: prolonged standing Relieving factors: heating pad  PRECAUTIONS: Fall and Other: L 4 and 5 ribs fx  RED FLAGS: None   WEIGHT BEARING RESTRICTIONS: No  FALLS: Has patient fallen in last 6 months? Yes. Number of falls 1  LIVING ENVIRONMENT: Lives with: lives with their spouse Lives in: House/apartment Stairs: 3 steps to enter back door with handrial on R; 1 story home Has following equipment at home: Single point cane, Walker - 4 wheeled, Shower bench, bed side commode, and Grab bars  PLOF: Independent, Vocation/Vocational requirements: retired, and Leisure: reading, watching sports  PATIENT GOALS: I'd like to get rid of this tight back and the walker.  OBJECTIVE:   TODAY'S TREATMENT: 03/04/24 Activity Comments  NU-step level 3 x 6 min W/ MHP to low back for back pain Pre-gait to promote reciprocal arm/leg swing  Gait training -w/out AD -w/ rollator --180 ft with onset of LE fatigue both conditions -w/out AD: uneven/compliant surfaces, slalom around posts and sharp turns  Pt education Re: youtube resources for seated exercise for seniors to improve activity during the day              TODAY'S TREATMENT: 02/19/23 Activity Comments  NU-step resistance intervals x 8 min Level 2 warm-up x 2 min 30 sec heavy resist; 60 sec light  L-stretch at counter 1x10   Bird dog at counter 1x10   Static multisensory balance Increase in LE tremors   Gait training Trials w/ cane and supervision for sequencing cues             HOME EXERCISE PROGRAM Last updated: 02/11/24 Access Code: [email or web address] URL: [email or web address] Date: 02/11/2024 Prepared by: [facility] - Outpatient  Rehab - [facility]  Exercises - Seated Flexion Stretch with Swiss Ball  - 1 x  daily - 5 x weekly - 2  sets - 10 reps - Seated Thoracic Flexion and Rotation with Swiss Ball  - 1 x daily - 5 x weekly - 2 sets - 10 reps - Supine Lower Trunk Rotation  - 1 x daily - 5 x weekly - 2 sets - 10 reps - Standing 'L' Stretch at Counter  - 1 x daily - 7 x weekly - 1-2 sets - 10 reps - Bird Dog on Counter  - 1 x daily - 3 x weekly - 3 sets - 10 reps  ALSO ADDED: sitting diaphragmatic breathing handout PRN   PATIENT EDUCATION: Education details: *02/13/2024:  Reviewed HEP and encouraged gentle, small, motions to avoid pain.  HEP with edu to stop if pain occurs, edu on benefits of diaphragmatic breathing on her vitals and pt's nervous energy that she complains of since hospitalization. Also advised to speak to PCP about this in case this could be a medication side effect  Person educated: Patient Education method: Explanation, Demonstration, Tactile cues, Verbal cues, and Handouts Education comprehension: verbalized understanding and returned demonstration      Note: Objective measures were completed at Evaluation unless otherwise noted.  DIAGNOSTIC FINDINGS:  12/19/23 brain MRI:  Unchanged punctate focus of restricted diffusion in the right hippocampus as described on 12/16/2023 MRI.  12/17/23 MR head angio: Normal intracranial MRA. No large vessel occlusion or other vascular abnormality. No hemodynamically significant or correctable stenosis.  12/19/23 CT angio: Acute appearing mildly displaced left fourth and fifth lateral rib fractures. Small left-sided pleural effusion with dependent atelectasis in the left base.   COGNITION: Overall cognitive status: Within functional limits for tasks assessed   SENSATION: Pt denies N/T in UE/LEs  COORDINATION: Alternating pronation/supination: WNL B Alternating toe tap: WNL B Finger to nose: tremor, slightly slowed B   MUSCLE TONE: 1-2 bears clonus in B ankles  PALPATION: TTP over posterior R and L ribs, midline LB and B  lumbar paraspinals. Very tight in B paraspinals and QL   POSTURE: rounded shoulders, forward head, increased thoracic kyphosis, and R shoulder depressed Observe slight B LE tremor in standing  LOWER EXTREMITY MMT:     MMT  (in sitting) Right Eval Left Eval  Hip flexion 4- 4  Hip extension    Hip abduction 4 4  Hip adduction 4 4  Hip internal rotation    Hip external rotation    Knee flexion 4+ 4+  Knee extension 4+ 4  Ankle dorsiflexion 4+ 4+  Ankle plantarflexion 4+ 4+  Ankle inversion    Ankle eversion     (Blank rows = not tested)  LOWER EXTREMITY ROM:    AROM Right Eval Left Eval  Hip flexion    Hip extension    Hip abduction    Hip adduction    Hip internal rotation    Hip external rotation    Knee flexion    Knee extension    Ankle dorsiflexion 29 15  Ankle plantarflexion    Ankle inversion    Ankle eversion    (Blank rows = not tested)  GAIT: Findings: Assistive device utilized:Walker - 4 wheeled, Level of assistance: Modified independence, and Comments: difficulty maneuvering in tight spaces with walker  FUNCTIONAL TESTS:  5 times sit to stand: 13.65 sec with B armrests BergBETHA PLENTY   [facility] PT Assessment - 01/30/24 0001       Standardized Balance Assessment   Standardized Balance Assessment Berg Balance Test      Berg Balance Test   Sit to  Stand Able to stand without using hands and stabilize independently    Standing Unsupported Able to stand 2 minutes with supervision    Sitting with Back Unsupported but Feet Supported on Floor or Stool Able to sit safely and securely 2 minutes    Stand to Sit Sits safely with minimal use of hands    Transfers Able to transfer safely, minor use of hands    Standing Unsupported with Eyes Closed Able to stand 10 seconds with supervision    Standing Unsupported with Feet Together Needs help to attain position but able to stand for 30 seconds with feet together    From Standing, Reach Forward with Outstretched Arm  Can reach forward >12 cm safely (5)    From Standing Position, Pick up Object from Floor Able to pick up shoe, needs supervision    From Standing Position, Turn to Look Behind Over each Shoulder Looks behind one side only/other side shows less weight shift    Turn 360 Degrees Able to turn 360 degrees safely one side only in 4 seconds or less    Standing Unsupported, Alternately Place Feet on Step/Stool Able to complete >2 steps/needs minimal assist    Standing Unsupported, One Foot in Front Able to take small step independently and hold 30 seconds    Standing on One Leg Tries to lift leg/unable to hold 3 seconds but remains standing independently    Total Score 39    Berg comment: pt required 2 sit breaks                                                                                                                                      TREATMENT DATE: 01/30/24    PATIENT EDUCATION: Education details: prognosis, POC, edu on exam findings and how they relate to functional impairments, pt's goals  Person educated: Patient Education method: Explanation Education comprehension: verbalized understanding  HOME EXERCISE PROGRAM: Not yet initiated  GOALS: Goals reviewed with patient? Yes  SHORT TERM GOALS: Target date: 02/27/2024  Patient to be independent with initial HEP. Baseline: HEP initiated Goal status: MET    LONG TERM GOALS: Target date: 03/26/2024  Patient to be independent with advanced HEP. Baseline: Not yet initiated  Goal status: IN PROGRESS  Patient to demonstrate B LE strength >/=4+/5.  Baseline: See above Goal status: IN PROGRESS  6 minute walk to improve to at least 700 ft, for improved gait efficiency and endurance.  Baseline: 620 ft, 2 seated rest breaks Goal status: IN PROGRESS  Patient to ambulate 211ft without AD with good safety and stability.  Baseline: using 4WW Goal status: IN PROGRESS  Patient to demonstrate 5xSTS test in <15 sec without UE  support in order to decrease risk of falls.  Baseline: 13 sec with B armrests Goal status: IN PROGRESS  Patient to score at least 45/56 on Berg in order to decrease risk of falls.  Baseline: 39 Goal status: IN PROGRESS  Patient to report tolerance for 30 minutes of chores without rest break without fatigue limiting.  Baseline: 5-7 minutes  Goal status: IN PROGRESS  ASSESSMENT:  CLINICAL IMPRESSION: Pt verbalizes compliance w/ HEP and notes ongoing issue of back pain and limited endurance affecting her balance/walking without AD.  NU-step to promote reciprocal arm/leg movement for gait mechanics and progressed to gait training w/out AD and use of visual anchors to help with walking open space w/out AD and performed well without obvious imbalance or notable gait deviations except for some LLE circumduction and decreased ankle DF in swing phase.  Added challenges to walking with obstacles and compliant surface and able to perform at supervision level with good foot clearance with compliant surface.  Chief limitation during gait with or without AD was onset of fatigue and similar distances between conditions.  Demonstrated Youtube as a resource for at-home exercise ideas to afford more variety and for aspect of improving endurance via such activities as seated exercise for seniors that would be safe to perform with intent on improving endurance/activity tolerance.    OBJECTIVE IMPAIRMENTS: Abnormal gait, decreased activity tolerance, decreased balance, decreased coordination, decreased endurance, decreased mobility, difficulty walking, decreased strength, increased muscle spasms, postural dysfunction, and pain.   ACTIVITY LIMITATIONS: carrying, lifting, bending, sitting, standing, squatting, stairs, transfers, bathing, toileting, dressing, reach over head, hygiene/grooming, and locomotion level  PARTICIPATION LIMITATIONS: meal prep, cleaning, laundry, shopping, community activity, and  church  PERSONAL FACTORS: Age, Fitness, Past/current experiences, Time since onset of injury/illness/exacerbation, and 3+ comorbidities: Anxiety, CKD, depression, HA, HLD, HTN, seizures, R TKA, breast lumpectomy  are also affecting patient's functional outcome.   REHAB POTENTIAL: Good  CLINICAL DECISION MAKING: Evolving/moderate complexity  EVALUATION COMPLEXITY: Moderate  PLAN:  PT FREQUENCY: 2x/week  PT DURATION: 8 weeks  PLANNED INTERVENTIONS: 97164- PT Re-evaluation, 97750- Physical Performance Testing, 97110-Therapeutic exercises, 97530- Therapeutic activity, [id number]- Neuromuscular re-education, 97535- Self Care, [id number]- Manual therapy, [id number]- Gait training, [phone]- Canalith repositioning, [id number]- Aquatic Therapy, [phone] (1-2 muscles), [id number] (3+ muscles)- Dry Needling, Patient/Family education, Balance training, Stair training, Taping, Joint mobilization, Spinal mobilization, Vestibular training, DME instructions, Cryotherapy, and Moist heat  PLAN FOR NEXT SESSION: Try to initiate HEP with standing balance, hip strength, gentle lumbar mobility while being mindful of L ribs fx     10:42 AM, 03/04/24 M. Kelly Harlyn Italiano, PT, DPT Physical Therapist- [place] Office Number: [phone]         "

## 2024-03-04 NOTE — Therapy (Signed)
 " OUTPATIENT OCCUPATIONAL THERAPY NEURO  Treatment Session  Patient Name: [name] MRN: [id number] DOB:1945-04-22, 79 y.o., female Today's Date: 03/04/2024  PCP: Wendolyn Jenkins Jansky, MD REFERRING PROVIDER: Maurice Sharlet RAMAN, PA-C  END OF SESSION:  OT End of Session - 03/04/24 1043     Visit Number 3    Number of Visits 9    Date for Recertification  04/29/24    Authorization Type MCR A&B, visit limit PT/OT/ST combined 60    OT Start Time 1020    OT Stop Time 1100    OT Time Calculation (min) 40 min    Activity Tolerance Patient tolerated treatment well    Behavior During Therapy [facility] for tasks assessed/performed            Past Medical History:  Diagnosis Date   Allergy 1980's   Sulfa drugs and penecillin   Anxiety    Cataract 2021   Corrected   Chronic kidney disease 2024   Stage 3   Depression    Headache    SINUS   Hyperlipidemia    Hypertension 2021   Osteopenia    Osteopenia    Primary localized osteoarthritis of right knee    Seizures (HCC) 10/2023   Past Surgical History:  Procedure Laterality Date   BREAST EXCISIONAL BIOPSY Right    benign cyst removal    CARPAL TUNNEL RELEASE Right 2015   Ortho in [place]   CHOLECYSTECTOMY N/A 02/08/2022   Procedure: LAPAROSCOPIC CHOLECYSTECTOMY WITH INTRAOPERATIVE CHOLANGIOGRAM;  Surgeon: Debby Hila, MD;  Location: [facility] ORS;  Service: General;  Laterality: N/A;   EYE SURGERY  12/2019   cataracts   FRACTURE SURGERY  2016   Rt.  leg   JOINT REPLACEMENT  09/2015   Right knee   LASIK     MENISCUS REPAIR Right 2011   Orthopedic in [facility]   TOTAL KNEE ARTHROPLASTY Right 09/20/2015   TOTAL KNEE ARTHROPLASTY Right 09/20/2015   Procedure: TOTAL KNEE ARTHROPLASTY;  Surgeon: Lamar Millman, MD;  Location: [facility] OR;  Service: Orthopedics;  Laterality: Right;   Trigger Thumb Right 2015   Done in [place] at the same time as the carpal tunnel release   TUBAL LIGATION  1980   Patient Active Problem List   Diagnosis  Date Noted   Anxiety state 12/26/2023   Debility 12/24/2023   Dizziness 12/16/2023   Seizure (HCC) 10/23/2023   Encephalopathy, metabolic 10/23/2023   AKI (acute kidney injury) 10/23/2023   Hypokalemia 10/23/2023   Seizure-like activity (HCC) 10/17/2023   Trigger finger, left ring finger 09/04/2023   DOE (dyspnea on exertion) 03/02/2023   Stage 3 chronic kidney disease (HCC) 03/02/2023   Left carpal tunnel syndrome 07/25/2022   Primary osteoarthritis of both first carpometacarpal joints 07/25/2022   Arthritis 07/04/2022   Anxiety 07/04/2022   Depression 07/04/2022   Primary osteoarthritis involving multiple joints 06/11/2022   Aortic atherosclerosis 02/05/2022   Facial lesion 09/30/2021   Bilateral hand pain 09/30/2021   Impingement syndrome, shoulder, left 02/16/2021   Impingement syndrome, shoulder, right 09/07/2020   MDD (recurrent major depressive disorder) in remission 09/02/2019   Generalized anxiety disorder 09/02/2019   Primary insomnia 08/16/2019   Post-menopausal 05/14/2017   Prediabetes 01/12/2017   Snoring 07/20/2016   Obsessive-compulsive personality trait 12/07/2015   Primary localized osteoarthritis of right knee    Essential hypertension 08/04/2015   Hyperlipidemia 08/04/2015   Osteopenia 08/04/2015    ONSET DATE: 12/25/2023  REFERRING DIAG: G93.41 (ICD-10-CM) - Encephalopathy, metabolic  THERAPY DIAG:  Muscle weakness (generalized)  Other symptoms and signs involving the nervous system  Other lack of coordination  Tremor  Rationale for Evaluation and Treatment: Rehabilitation  SUBJECTIVE:   SUBJECTIVE STATEMENT: Pt reporting that she is still shaking, she has a call into a neurologist to f/u.  Pt accompanied by: self  PERTINENT HISTORY: Pt hit head during fall on 10/31. MR showed punctate 3 mm focus of restricted diffusion involving the mesial right temporal lobe/hippocampal formation. Primary differential considerations include a punctate  acute ischemic nonhemorrhagic infarct versus changes of transient global amnesia. Performing EEG to rule out seizure. Was recently admitted 10/2023 for seizure-like activity. MRI with no evidence of acute intracranial abnormality. CT chest 11/5 revealed acute non-displaced L 4-5 lateral rib fractures. PMH positive for HTN, GERD, MDD, GAD, insomina, CVA.  PRECAUTIONS: Other: seizures hx, no electrical modalities  WEIGHT BEARING RESTRICTIONS: No  PAIN:  Are you having pain? Yes: NPRS scale: 2-3/10 Pain location: lower back Pain description: aching Aggravating factors: twisting, standing prolonged Relieving factors: Tylenol   FALLS: Has patient fallen in last 6 months? Yes. Number of falls 1, fractured ribs  LIVING ENVIRONMENT: Lives with: lives with their spouse Lives in: House/apartment 1 level Stairs: Yes: External: 3 steps; on right going up Has following equipment at home: Walker - 4 wheeled, Grab bars, and walk in shower   PLOF: Independent, Independent with household mobility without device, and Independent with community mobility without device  PATIENT GOALS: I was sewing before but my coordination is not what it used to be since the shakes, I needed to hem some of my pants. I want to improve the shakes in my hands too, I have trouble getting dressed and sometimes feeding myself, it gets worse when I get tired or stressed.  OBJECTIVE:  Note: Objective measures were completed at Evaluation unless otherwise noted.  HAND DOMINANCE: Right  ADLs: Overall ADLs: Independent but does take longer Transfers/ambulation related to ADLs: Eating: Independent with increased time Grooming: Independent with increased time UB Dressing: Independent with increased time LB Dressing: Independent with increased time Toileting: Independent Bathing: Mod I Tub Shower transfers: Mod I Equipment: Shower seat with back, Grab bars, and Walk in shower  IADLs: Shopping: Husband has been completing  more since hospitalization Light housekeeping: Has hired help  Meal Prep: Has done a little, husband  Community mobility: Not allowed to drive for 6 months since d/c Medication management: husband has been setting up pill organizer since hospitalization Financial management: Husband has been completing since hospitalization Handwriting: 75% legible and tremors since hospitalization  MOBILITY STATUS: Needs Assist: utilizes rollator and Hx of falls  POSTURE COMMENTS:  No Significant postural limitations Sitting balance: WNL  ACTIVITY TOLERANCE: Activity tolerance: lower back hurts affecting ability to stand prolonged   FUNCTIONAL OUTCOME MEASURES: PSFS: 4.3 average score  UPPER EXTREMITY ROM:  WNL  Active ROM Right eval Left eval  Shoulder flexion    Shoulder abduction    Shoulder adduction    Shoulder extension    Shoulder internal rotation    Shoulder external rotation    Elbow flexion    Elbow extension    Wrist flexion    Wrist extension    Wrist ulnar deviation    Wrist radial deviation    Wrist pronation    Wrist supination    (Blank rows = not tested)  UPPER EXTREMITY MMT:     MMT Right eval Left eval  Shoulder flexion    Shoulder abduction  Shoulder adduction    Shoulder extension    Shoulder internal rotation    Shoulder external rotation    Middle trapezius    Lower trapezius    Elbow flexion    Elbow extension    Wrist flexion    Wrist extension    Wrist ulnar deviation    Wrist radial deviation    Wrist pronation    Wrist supination    (Blank rows = not tested)  HAND FUNCTION: Grip strength: Right: 24 lbs; Left: 28 lbs  COORDINATION: 9 Hole Peg test: Right: 31.09 sec; Left: 40.35 sec Box and Blocks:  Right 38 blocks, Left 35 blocks Tremors: Resting and action Pt did hit divider a couple times d/t tremors, min cues for proper form d/t pt bringing block around divider  PPT 2 (self feeding): 16.81 seconds  Button/unbutton 3 buttons:  24.09 seconds SENSATION: WFL  EDEMA: none  MUSCLE TONE: RUE: Within functional limits and LUE: Within functional limits  COGNITION: Overall cognitive status: Within functional limits for tasks assessed  VISION: Subjective report: Sometimes I feel like my vision is a little different but I went to the optometrist last week and he said I didn't even need to change my prescription. Baseline vision: Wears glasses for reading only Visual history: cataracts  VISION ASSESSMENT: To be further assessed in functional context  Patient has difficulty with following activities due to following visual impairments: n/a  PERCEPTION: WFL  PRAXIS: Impaired: Motor planning  OBSERVATIONS: tremors and weakness in grip strength                                                                                                                             TREATMENT DATE:  03/04/24 Coordination: engaged in small peg board pattern replication with RUE and LUE.  OT demonstrating mod tremors bilaterally, reporting increased tremor as task went on.  OT educating on bracing of arm against torso or bracing with opposite arm to decrease tremor.  Provided pt with handout of tremor reducing strategies with bracing and weighted utensils.   Self-feeding: OT educating and having pt trial various setup and weighted utensils to aid in decreasing tremors during self-feeding.  OT educated on use of shelf liner or dycem to decrease movement of plates/bowls and weighted utensils to decrease tremors on scooping and self-feeding.  OT educating on hand placement on utensil closer to front of utensil to improve motor control.  OT also writing down how to add weight to utensil as pt not wanting to purchase weighted utensils just yet.      02/13/24 Standing balance/rollator training: pt stating she sometimes tries to walk without rollator in home and educated on keeping with her for fall prevention. Rollator training for carryover  to ADL/IADL with weaving through 6/6 cones with no hits, as well as training for parking and locking/unlocking brakes for seated recovery breaks for IADLs at home such as cooking, going to the grocery store, etc.  Pt  performing FMC/grip strengthening task for reaching and placing clips colors yellow - black (significant difficulty with color black and having to modify) focusing on R hand only.  Note pt with tremors fluctuating throughout session - gently asking about potential anxiety and pt stating she does have anxiety. Educated as well for techniques to reduce tremors when eating/performing detailed movements for weight bearing and positioning at table.      PATIENT EDUCATION: Education details: tremor compensation strategies Person educated: Patient Education method: [employer] Education comprehension: verbalized understanding and needs further education  HOME EXERCISE PROGRAM:    GOALS: Goals reviewed with patient? Yes  SHORT TERM GOALS: Target date: 03/02/23  Patient will demonstrate HEP for B grip and coordination with 25% verbal cues or less for proper execution.  Baseline: Goal status: in progress  2.  Patient will independently recall at least 3 energy conservation principles in relation to ADLs to increase functional independence.  Baseline:  Goal status: in progress  3.  Pt will verbalize understanding of adaptive strategies to increase ease with ADLS/IADLS   Baseline:  Goal status: in progress  4.  Patient will demo improved FM coordination as evidenced by completing nine-hole peg with use of L hand in 5 seconds or less.  Baseline:  Goal status: in progress   LONG TERM GOALS: Target date: 04/29/24  Patient will report at least two-point increase in average PSFS score or at least three-point increase in a single activity score indicating functionally significant improvement given minimum detectable change.  Baseline: 4.3 total score (See above for  individual activity scores)   Goal status: in progress  2.  Patient will demonstrate at least 30 lbs R grip strength as needed to open jars and other containers.  Baseline:  Right: 24 lbs; Left: 28 lbs Goal status: in progress  3.  Pt will demonstrate improved ease with fastening buttons as evidenced by decreasing 3 button/unbutton time by 4 seconds  Baseline: Button/unbutton 3 buttons: 24.09 seconds Goal status: in progress  4.  Pt will demonstrate improved ease with feeding as evidenced by decreasing PPT#2 by 3 secs  Baseline: PPT 2 (self feeding): 16.81 seconds  Goal status: in progress  5.  Pt will complete simulated med mgmt with 100% accuracy and no more than min verbal cues  Baseline:  Goal status: in progress    ASSESSMENT:  CLINICAL IMPRESSION: Patient is a 79 y.o. female who was seen today for occupational therapy treatment session for s/p hospitalization for metabolic encephalopathy. Pt reporting as well MD unsure if seizure vs CVA activity as well. Hx includes OA of R knee, falls, seizures, CKD Stage III. Pt asking good questions about strategies to aid in self-feeding, receptive to recommendations and demonstrating mild improvements with weighted utensils.  Pt demonstrating increased tremors when anxious or in a hurry as well as increased intention tremor with [facility] tasks.  Patient currently presents slightly below baseline level of functioning demonstrating functional deficits and impairments as noted below. Pt would benefit from skilled OT services in the outpatient setting to work on impairments as noted below to help pt return to PLOF as able.     PERFORMANCE DEFICITS: in functional skills including ADLs, IADLs, coordination, dexterity, strength, Fine motor control, and endurance, cognitive skills including safety awareness, and psychosocial skills including coping strategies, environmental adaptation, and routines and behaviors.    PLAN:  OT FREQUENCY: 1x/week  OT  DURATION: 8 weeks  PLANNED INTERVENTIONS: 02831 OT Re-evaluation, 97535 self care/ADL training, 02889  therapeutic exercise, 97530 therapeutic activity, 97112 neuromuscular re-education, psychosocial skills training, energy conservation, coping strategies training, patient/family education, and DME and/or AE instructions  RECOMMENDED OTHER SERVICES: none, receiving PT/SLP evals  CONSULTED AND AGREED WITH PLAN OF CARE: Patient  PLAN FOR NEXT SESSION:  AE education (button hook, trial weighted pen, review weighted utensils) Coordination/putty HEP Energy conservation/locking rollator for seated recovery breaks   Shireen Rayburn, OTR/L 03/04/2024, 10:43 AM   [facility] at [facility] [address] [place], [place] [address] Phone # [phone] Fax # [phone]         "

## 2024-03-05 NOTE — Therapy (Signed)
 " OUTPATIENT PHYSICAL THERAPY NEURO TREATMENT   Patient Name: [name] MRN: [id number] DOB:1945-12-28, 79 y.o., female Today's Date: 03/06/2024   PCP: Wendolyn Jenkins Jansky, MD  REFERRING PROVIDER: Maurice Sharlet RAMAN, PA-C  END OF SESSION:  PT End of Session - 03/06/24 1055     Visit Number 7    Number of Visits 17    Date for Recertification  03/26/24    Authorization Type Medicare/UMR/GEHA    Authorization - Number of Visits 60   combined   Progress Note Due on Visit 10    PT Start Time 1022    PT Stop Time 1050    PT Time Calculation (min) 28 min    Equipment Utilized During Treatment Gait belt    Activity Tolerance Other (comment)   limited by c/o shakiness, sweating, SOB with activity today   Behavior During Therapy Restless;Anxious             Past Medical History:  Diagnosis Date   Allergy 1980's   Sulfa drugs and penecillin   Anxiety    Cataract 2021   Corrected   Chronic kidney disease 2024   Stage 3   Depression    Headache    SINUS   Hyperlipidemia    Hypertension 2021   Osteopenia    Osteopenia    Primary localized osteoarthritis of right knee    Seizures (HCC) 10/2023   Past Surgical History:  Procedure Laterality Date   BREAST EXCISIONAL BIOPSY Right    benign cyst removal    CARPAL TUNNEL RELEASE Right 2015   Ortho in [place]   CHOLECYSTECTOMY N/A 02/08/2022   Procedure: LAPAROSCOPIC CHOLECYSTECTOMY WITH INTRAOPERATIVE CHOLANGIOGRAM;  Surgeon: Debby Hila, MD;  Location: [facility];  Service: General;  Laterality: N/A;   EYE SURGERY  12/2019   cataracts   FRACTURE SURGERY  2016   Rt.  leg   JOINT REPLACEMENT  09/2015   Right knee   LASIK     MENISCUS REPAIR Right 2011   [facility]   TOTAL KNEE ARTHROPLASTY Right 09/20/2015   TOTAL KNEE ARTHROPLASTY Right 09/20/2015   Procedure: TOTAL KNEE ARTHROPLASTY;  Surgeon: Lamar Millman, MD;  Location: [facility];  Service: Orthopedics;  Laterality: Right;   Trigger Thumb Right 2015    Done in [place] at the same time as the carpal tunnel release   TUBAL LIGATION  1980   Patient Active Problem List   Diagnosis Date Noted   Anxiety state 12/26/2023   Debility 12/24/2023   Dizziness 12/16/2023   Seizure (HCC) 10/23/2023   Encephalopathy, metabolic 10/23/2023   AKI (acute kidney injury) 10/23/2023   Hypokalemia 10/23/2023   Seizure-like activity (HCC) 10/17/2023   Trigger finger, left ring finger 09/04/2023   DOE (dyspnea on exertion) 03/02/2023   Stage 3 chronic kidney disease (HCC) 03/02/2023   Left carpal tunnel syndrome 07/25/2022   Primary osteoarthritis of both first carpometacarpal joints 07/25/2022   Arthritis 07/04/2022   Anxiety 07/04/2022   Depression 07/04/2022   Primary osteoarthritis involving multiple joints 06/11/2022   Aortic atherosclerosis 02/05/2022   Facial lesion 09/30/2021   Bilateral hand pain 09/30/2021   Impingement syndrome, shoulder, left 02/16/2021   Impingement syndrome, shoulder, right 09/07/2020   MDD (recurrent major depressive disorder) in remission 09/02/2019   Generalized anxiety disorder 09/02/2019   Primary insomnia 08/16/2019   Post-menopausal 05/14/2017   Prediabetes 01/12/2017   Snoring 07/20/2016   Obsessive-compulsive personality trait 12/07/2015   Primary localized osteoarthritis of right  knee    Essential hypertension 08/04/2015   Hyperlipidemia 08/04/2015   Osteopenia 08/04/2015    ONSET DATE: 12/16/23  REFERRING DIAG: G93.41 (ICD-10-CM) - Encephalopathy, metabolic  THERAPY DIAG:  Muscle weakness (generalized)  Unsteadiness on feet  Other symptoms and signs involving the nervous system  Other low back pain  Rationale for Evaluation and Treatment: Rehabilitation  SUBJECTIVE:                                                                                                                                                                                             SUBJECTIVE STATEMENT: Pretty well. I'm  pretty jumpy today- I think it's mostly nerves. Feeling frustrated that I'm not where I want to be. My back is still tight and it's still hard for me to stand for very long. The stretches have helped and back tightness is better than it was.    Pt accompanied by: self  PERTINENT HISTORY: Anxiety, CKD, depression, HA, HLD, HTN, seizures, R TKA, breast lumpectomy   PAIN:  Are you having pain? Yes: NPRS scale: 1/10 Pain location: B LB Pain description: discomfort, tight Aggravating factors: prolonged standing Relieving factors: heating pad  PRECAUTIONS: Fall and Other: L 4 and 5 ribs fx  RED FLAGS: None   WEIGHT BEARING RESTRICTIONS: No  FALLS: Has patient fallen in last 6 months? Yes. Number of falls 1  LIVING ENVIRONMENT: Lives with: lives with their spouse Lives in: House/apartment Stairs: 3 steps to enter back door with handrial on R; 1 story home Has following equipment at home: Single point cane, Walker - 4 wheeled, Shower bench, bed side commode, and Grab bars  PLOF: Independent, Vocation/Vocational requirements: retired, and Leisure: reading, watching sports  PATIENT GOALS: I'd like to get rid of this tight back and the walker.  OBJECTIVE:     TODAY'S TREATMENT: 03/06/24 Activity Comments  Gait training with SPC 3ft Min A required for sequencing as well as verbal instruction. Pt unsteady and shaky and c/o feeling sweaty, requiring sit break   Vitals after sitting water break  157/80 mmHg, 96 bpm 95% spO2  Gait training with SPC 88ft Much better sequencing but pt c/o SOB and feeling sweaty. Pt denies chest pain or other red flags  Vitals  156/83 mmHg, 98% spO2, 114bpm after short sit break, lowering to 98bpm after a few minutes. Patient also reports resolution of SOB        PATIENT EDUCATION: Education details: discussed pt's worsening anxiety since her hospitalization- patient is agreeable to seeing a counselor as she has done so in the past. Recommended to reach  out to PCP  about this. Edu on normal vitals and to track this at home, advised that if she continues to have elevated HR at rest and SOB, to call 911  Person educated: Patient Education method: Explanation Education comprehension: verbalized understanding       HOME EXERCISE PROGRAM Last updated: 02/11/24 Access Code: [email or web address] URL: [email or web address] Date: 02/11/2024 Prepared by: [name] - Outpatient  Rehab - [facility]  Exercises - Seated Flexion Stretch with Swiss Ball  - 1 x daily - 5 x weekly - 2 sets - 10 reps - Seated Thoracic Flexion and Rotation with Swiss Ball  - 1 x daily - 5 x weekly - 2 sets - 10 reps - Supine Lower Trunk Rotation  - 1 x daily - 5 x weekly - 2 sets - 10 reps - Standing 'L' Stretch at Counter  - 1 x daily - 7 x weekly - 1-2 sets - 10 reps - Bird Dog on Counter  - 1 x daily - 3 x weekly - 3 sets - 10 reps  ALSO ADDED: sitting diaphragmatic breathing handout PRN     Note: Objective measures were completed at Evaluation unless otherwise noted.  DIAGNOSTIC FINDINGS:  12/19/23 brain MRI:  Unchanged punctate focus of restricted diffusion in the right hippocampus as described on 12/16/2023 MRI.  12/17/23 MR head angio: Normal intracranial MRA. No large vessel occlusion or other vascular abnormality. No hemodynamically significant or correctable stenosis.  12/19/23 CT angio: Acute appearing mildly displaced left fourth and fifth lateral rib fractures. Small left-sided pleural effusion with dependent atelectasis in the left base.   COGNITION: Overall cognitive status: Within functional limits for tasks assessed   SENSATION: Pt denies N/T in UE/LEs  COORDINATION: Alternating pronation/supination: WNL B Alternating toe tap: WNL B Finger to nose: tremor, slightly slowed B   MUSCLE TONE: 1-2 bears clonus in B ankles  PALPATION: TTP over posterior R and L ribs, midline LB and B lumbar paraspinals. Very tight in B paraspinals  and QL   POSTURE: rounded shoulders, forward head, increased thoracic kyphosis, and R shoulder depressed Observe slight B LE tremor in standing  LOWER EXTREMITY MMT:     MMT  (in sitting) Right Eval Left Eval  Hip flexion 4- 4  Hip extension    Hip abduction 4 4  Hip adduction 4 4  Hip internal rotation    Hip external rotation    Knee flexion 4+ 4+  Knee extension 4+ 4  Ankle dorsiflexion 4+ 4+  Ankle plantarflexion 4+ 4+  Ankle inversion    Ankle eversion     (Blank rows = not tested)  LOWER EXTREMITY ROM:    AROM Right Eval Left Eval  Hip flexion    Hip extension    Hip abduction    Hip adduction    Hip internal rotation    Hip external rotation    Knee flexion    Knee extension    Ankle dorsiflexion 29 15  Ankle plantarflexion    Ankle inversion    Ankle eversion    (Blank rows = not tested)  GAIT: Findings: Assistive device utilized:Walker - 4 wheeled, Level of assistance: Modified independence, and Comments: difficulty maneuvering in tight spaces with walker  FUNCTIONAL TESTS:  5 times sit to stand: 13.65 sec with B armrests BergBETHA PLENTY   [facility] PT Assessment - 01/30/24 0001       Standardized Balance Assessment   Standardized Balance Assessment Berg Balance Test      [facility] Balance Test  Sit to Stand Able to stand without using hands and stabilize independently    Standing Unsupported Able to stand 2 minutes with supervision    Sitting with Back Unsupported but Feet Supported on Floor or Stool Able to sit safely and securely 2 minutes    Stand to Sit Sits safely with minimal use of hands    Transfers Able to transfer safely, minor use of hands    Standing Unsupported with Eyes Closed Able to stand 10 seconds with supervision    Standing Unsupported with Feet Together Needs help to attain position but able to stand for 30 seconds with feet together    From Standing, Reach Forward with Outstretched Arm Can reach forward >12 cm safely (5)    From  Standing Position, Pick up Object from Floor Able to pick up shoe, needs supervision    From Standing Position, Turn to Look Behind Over each Shoulder Looks behind one side only/other side shows less weight shift    Turn 360 Degrees Able to turn 360 degrees safely one side only in 4 seconds or less    Standing Unsupported, Alternately Place Feet on Step/Stool Able to complete >2 steps/needs minimal assist    Standing Unsupported, One Foot in Front Able to take small step independently and hold 30 seconds    Standing on One Leg Tries to lift leg/unable to hold 3 seconds but remains standing independently    Total Score 39    Berg comment: pt required 2 sit breaks                                                                                                                                      TREATMENT DATE: 01/30/24    PATIENT EDUCATION: Education details: prognosis, POC, edu on exam findings and how they relate to functional impairments, pt's goals  Person educated: Patient Education method: Explanation Education comprehension: verbalized understanding  HOME EXERCISE PROGRAM: Not yet initiated  GOALS: Goals reviewed with patient? Yes  SHORT TERM GOALS: Target date: 02/27/2024  Patient to be independent with initial HEP. Baseline: HEP initiated Goal status: MET    LONG TERM GOALS: Target date: 03/26/2024  Patient to be independent with advanced HEP. Baseline: Not yet initiated  Goal status: IN PROGRESS  Patient to demonstrate B LE strength >/=4+/5.  Baseline: See above Goal status: IN PROGRESS  6 minute walk to improve to at least 700 ft, for improved gait efficiency and endurance.  Baseline: 620 ft, 2 seated rest breaks Goal status: IN PROGRESS  Patient to ambulate 265ft without AD with good safety and stability.  Baseline: using 4WW Goal status: IN PROGRESS  Patient to demonstrate 5xSTS test in <15 sec without UE support in order to decrease risk of falls.   Baseline: 13 sec with B armrests Goal status: IN PROGRESS  Patient to score at least 45/56 on Berg in order to decrease risk of  falls.  Baseline: 39 Goal status: IN PROGRESS  Patient to report tolerance for 30 minutes of chores without rest break without fatigue limiting.  Baseline: 5-7 minutes  Goal status: IN PROGRESS  ASSESSMENT:  CLINICAL IMPRESSION: Patient arrived to session expressing frustration surrounding slow progress and c/o remaining anxiety and shakiness. Discussed asking PCP for mental counselor referral and pt is agreeable to do this. Trialed gait training with cane which required min A and cueing to encourage appropriate sequencing. Patient limited by c/o shakiness and SOB with gait training today. BP WFL however HR appeared elevated for level of activity. This resolved to normal levels after sitting water break both times. Patient continues to cite nerves as cause of her symptoms, however did educate on normal HR and to monitor at home as well as call 911 if SOB worsens. Patient exited session without further complaints.    OBJECTIVE IMPAIRMENTS: Abnormal gait, decreased activity tolerance, decreased balance, decreased coordination, decreased endurance, decreased mobility, difficulty walking, decreased strength, increased muscle spasms, postural dysfunction, and pain.   ACTIVITY LIMITATIONS: carrying, lifting, bending, sitting, standing, squatting, stairs, transfers, bathing, toileting, dressing, reach over head, hygiene/grooming, and locomotion level  PARTICIPATION LIMITATIONS: meal prep, cleaning, laundry, shopping, community activity, and church  PERSONAL FACTORS: Age, Fitness, Past/current experiences, Time since onset of injury/illness/exacerbation, and 3+ comorbidities: Anxiety, CKD, depression, HA, HLD, HTN, seizures, R TKA, breast lumpectomy  are also affecting patient's functional outcome.   REHAB POTENTIAL: Good  CLINICAL DECISION MAKING: Evolving/moderate  complexity  EVALUATION COMPLEXITY: Moderate  PLAN:  PT FREQUENCY: 2x/week  PT DURATION: 8 weeks  PLANNED INTERVENTIONS: [id number]- PT Re-evaluation, 97750- Physical Performance Testing, 97110-Therapeutic exercises, 97530- Therapeutic activity, 97112- Neuromuscular re-education, 97535- Self Care, [id number]- Manual therapy, [phone]- Gait training, [phone]- Canalith repositioning, [id number]- Aquatic Therapy, [phone] (1-2 muscles), [id number] (3+ muscles)- Dry Needling, Patient/Family education, Balance training, Stair training, Taping, Joint mobilization, Spinal mobilization, Vestibular training, DME instructions, Cryotherapy, and Moist heat  PLAN FOR NEXT SESSION: continue monitor vitals; Try to initiate HEP with standing balance, hip strength, gentle lumbar mobility while being mindful of L ribs fx      Louana Terrilyn Christians, PT, DPT 03/06/24 11:00 AM  [facility] at [facility] [address] [place], [place] [address] Phone # [phone] Fax # [phone]   "

## 2024-03-06 ENCOUNTER — Encounter: Payer: Self-pay | Admitting: Physical Therapy

## 2024-03-06 ENCOUNTER — Ambulatory Visit: Admitting: Physical Therapy

## 2024-03-06 ENCOUNTER — Ambulatory Visit

## 2024-03-06 DIAGNOSIS — R4701 Aphasia: Secondary | ICD-10-CM

## 2024-03-06 DIAGNOSIS — M5459 Other low back pain: Secondary | ICD-10-CM

## 2024-03-06 DIAGNOSIS — R41841 Cognitive communication deficit: Secondary | ICD-10-CM

## 2024-03-06 DIAGNOSIS — R29818 Other symptoms and signs involving the nervous system: Secondary | ICD-10-CM

## 2024-03-06 DIAGNOSIS — M6281 Muscle weakness (generalized): Secondary | ICD-10-CM

## 2024-03-06 DIAGNOSIS — R2681 Unsteadiness on feet: Secondary | ICD-10-CM

## 2024-03-06 NOTE — Therapy (Signed)
 " OUTPATIENT SPEECH LANGUAGE PATHOLOGY TREATMENT   Patient Name: [name] MRN: [id number] DOB:[birth year], 79 y.o., female Today's Date: 03/06/2024  PCP: Wendolyn Jenkins Jansky, MD REFERRING PROVIDER: Gregg Lek, MD  END OF SESSION:  End of Session - 03/06/24 0935     Visit Number 8    Number of Visits 17    Date for Recertification  04/04/24    SLP Start Time 0935    SLP Stop Time  1015    SLP Time Calculation (min) 40 min    Activity Tolerance Patient tolerated treatment well              Past Medical History:  Diagnosis Date   Allergy 1980's   Sulfa drugs and penecillin   Anxiety    Cataract 2021   Corrected   Chronic kidney disease 2024   Stage 3   Depression    Headache    SINUS   Hyperlipidemia    Hypertension 2021   Osteopenia    Osteopenia    Primary localized osteoarthritis of right knee    Seizures (HCC) 10/2023   Past Surgical History:  Procedure Laterality Date   BREAST EXCISIONAL BIOPSY Right    benign cyst removal    CARPAL TUNNEL RELEASE Right 2015   Ortho in [place]   CHOLECYSTECTOMY N/A 02/08/2022   Procedure: LAPAROSCOPIC CHOLECYSTECTOMY WITH INTRAOPERATIVE CHOLANGIOGRAM;  Surgeon: Debby Hila, MD;  Location: [facility];  Service: General;  Laterality: N/A;   EYE SURGERY  12/2019   cataracts   FRACTURE SURGERY  2016   Rt.  leg   JOINT REPLACEMENT  09/2015   Right knee   LASIK     MENISCUS REPAIR Right 2011   Orthopedic in [facility]   TOTAL KNEE ARTHROPLASTY Right 09/20/2015   TOTAL KNEE ARTHROPLASTY Right 09/20/2015   Procedure: TOTAL KNEE ARTHROPLASTY;  Surgeon: Lamar Millman, MD;  Location: [facility] OR;  Service: Orthopedics;  Laterality: Right;   Trigger Thumb Right 2015   Done in [place] at the same time as the carpal tunnel release   TUBAL LIGATION  1980   Patient Active Problem List   Diagnosis Date Noted   Anxiety state 12/26/2023   Debility 12/24/2023   Dizziness 12/16/2023   Seizure (HCC) 10/23/2023    Encephalopathy, metabolic 10/23/2023   AKI (acute kidney injury) 10/23/2023   Hypokalemia 10/23/2023   Seizure-like activity (HCC) 10/17/2023   Trigger finger, left ring finger 09/04/2023   DOE (dyspnea on exertion) 03/02/2023   Stage 3 chronic kidney disease (HCC) 03/02/2023   Left carpal tunnel syndrome 07/25/2022   Primary osteoarthritis of both first carpometacarpal joints 07/25/2022   Arthritis 07/04/2022   Anxiety 07/04/2022   Depression 07/04/2022   Primary osteoarthritis involving multiple joints 06/11/2022   Aortic atherosclerosis 02/05/2022   Facial lesion 09/30/2021   Bilateral hand pain 09/30/2021   Impingement syndrome, shoulder, left 02/16/2021   Impingement syndrome, shoulder, right 09/07/2020   MDD (recurrent major depressive disorder) in remission 09/02/2019   Generalized anxiety disorder 09/02/2019   Primary insomnia 08/16/2019   Post-menopausal 05/14/2017   Prediabetes 01/12/2017   Snoring 07/20/2016   Obsessive-compulsive personality trait 12/07/2015   Primary localized osteoarthritis of right knee    Essential hypertension 08/04/2015   Hyperlipidemia 08/04/2015   Osteopenia 08/04/2015    ONSET DATE: 12/16/23   REFERRING DIAG: R47.81 (ICD-10-CM) - Slurred speech R47.89 (ICD-10-CM) - Word finding difficulty  THERAPY DIAG:  Cognitive communication deficit  Aphasia  Rationale for Evaluation and Treatment: Rehabilitation  SUBJECTIVE:   SUBJECTIVE STATEMENT: I'm really jitttery today - I don't know what's going on.  Pt accompanied by: self  PERTINENT HISTORY: PMH of HTN, HLD, osteopenia, seizures, and CKD. She was hospitalized in September with AMS. She presented to [facility] on 12/16/23 with falls and slurred speech. She had hit her head and reported nausea and diarrhea. CT head was negative for acute abnormalities. CXR revealed mild cardiomegaly with no acute infiltrates. C-spine, right knee, right rib, and left shoulder Xrs have been negative. UA was WNL.  MRI showed possible punctate stroke MRA negative for LVO. LTM EEG on 11/4 is negative for seizures. LTM EEG on 11/5 was also negative for seizures. Patient is being admitted to CIR for impaired mobility and ADLs.   PAIN:  Are you having pain? Yes: NPRS scale: 5/10 Pain location: lower back Pain description: tight Aggravating factors: walking or standing Relieving factors: heat  FALLS: Has patient fallen in last 6 months?  See PT evaluation for details   PATIENT GOALS: Get rid of this tremor. Get my talking back to normal  OBJECTIVE:  Note: Objective measures were completed at Evaluation unless otherwise noted.  DIAGNOSTIC FINDINGS:  SLE 02/23/23: Cognitive/ Linguistic. Pt presents with a mild expressive language deficit. Portions of the Western Aphasia Battery- Bedside Form completed revealing strengths in naming, auditory comprehension, and repetition. Her fluency was described as; some hesitations and word finding difficulty. Pt confirms changes to language upon hospitalization however, reports symptoms have largely resolved and she is back to baseline. Informally, some instances of phonemic paraphasias noted with pt able to self correct consistently. She also demonstrated some mild cluttering though it did not impact communication. Patient's overall cognitive functioning was not formally assessed but appeared [facility] during functional tasks. Patient also endorses that she feels she is at her cognitive baseline. She was oriented x4 and verbalized recent medical hx in detail. No follow up intervention warranted at this time.    PATIENT REPORTED OUTCOME MEASURES (PROM): Communication Effectiveness Survey: not completed due to pt sx largely from psych basis                                                                                                                            TREATMENT DATE:  SFA-Semantic Feature Analysis  03/06/24: Pt more jittery and feeling more anxious feelings  today. Conversation back to ST room was with WNL fluidity but decr'd as pt sat down and SLP began practice with pt for overarticulation. After 7-8 minutes pt's speech was most like sx of cluttering. Pt endorsed feeling very anxious. SLP and pt reasoned that at this point pt's fluency sx worsen as pt thinks about her speech and the need to use compensations. She told pt that her sx are almost nothing at home when she is feeling normal (not anxious) and get worse when she feels jittery like today. She and SLP agreed that ST is likely not necessary for her at this time, as  he sx are worse due to psych issues and are very minimal/WFL when she is not feeling anxious. You hit it on the head, pt stated. SLP and pt agreed to cancel pt's remaining ST and pt will can to schedule more ST if she thinks it is necessary sometime in the next 4 weeks. If she does not call, she agreed to have SLP d/c her current plan of care.    03/04/24:SLP educated pt about energy conservation given her acknowledgement that her speech worsens with fatigue. SLP worked with pt at decreasing her speech rate with overarticulation in sentences. In reading 9 word sentences Lorinda was 85% successful in achieving a slower speech rate and minimizing/eliminating less-fluent speech. SLP moved to spontaneous sentence responses and the first 5 responses were almost WNL, and pt's overall success declined from that point. She said she was more anxious and SLP cued her to take some breaths and count to 10 which pt states normally helps her calm down. Pt's responses after this were better but after 4 responses began to decline again. SLP helped pt with calming down by encouraging breathing.  02/19/24: SLP engaged pt in disucssion about what would be best for her to practice today. Pt, given her s statement, stated she would like to target slowed rate compensation today so that errors in her speech will occur less and thus her communication will not  decline as frequently (due to decr'd frequency of anxiety from speaking errors). SLP modeled in sentences and pt repeated 90% success. In multiple sentence generation tasks today pt maintained WNL speech fluidity 85-90% of the time. SLP provided rare min A for slowing rate and overarticulation. Homework for generating slower speech rate in sentences.   02/13/24: Today SLP educated pt about VNEST. Pt noted to incr anxious sx as session progressed and her handwriting got more illegible. She misspelled words more after 5 minutes and this appeared to incr her anxious sx. She then began to not follow SLP directions and demonstrated more frequent impulsive behavior. SLP cued pt to slow down her task completion and her writing with min-mod success, for a short time (30 seconds) and then pt req'd cues again.   02/11/24: Pt needs VNEST next session. I think I've gotten pretty good at halfway through the sentence finding a different word to use. SLP educated pt about SFA today and pt noted to rush through the task and appear to ignore SLP cues. Attention appeared min varying to moderately worse today than prior sessions. Pt made articulatory errors x5 today - more repetitious in nature than aphasic. She asked at end of session, Is there something I can do to work on this 'blahblhblhblah (pt imitation of her articulatory errors today)? SLP provided consonant-laden phrases and modeled slow and more deliberate articulation with these sentences for pt. She return demonstrated very well.   02/04/24: Pt with one phonemic paraphasia in today's session. SLUMS today. Gaia scored 24/30 however SLP wonders if pt's visual acuity/spatial orientation was deficient for clock draw, as pt demonstrated [facility] explanations for verbal problem solving, organization/planning, and executive function (buying carpet, and if unexpected changes occurred in the buying experience). Pt indicated her husband fills the medbox but she is independent  in taking meds. She is not writing checks for bills at this time due to her writing (reportedly), she is reminding husband to pay (They are just sitting around, I'm used to paying them more quickly). On eval date she told SLP she is not paying bills  due to feeling foggy. Lastly, pt states that she writes appointments on the calendar religiously and (reportedly) knew about today's appointment when she awoke this morning. Pt's working memory/mental math/number manipulation was WNL  01/29/25: Eval completed. N/A.  PATIENT EDUCATION: Education details: SLP suspects deficits are mostly linguistic in nature, possible goals for ST Person educated: Patient Education method: Explanation Education comprehension: verbalized understanding   GOALS: Goals reviewed with patient? Yes  SHORT TERM GOALS: Target date:  03/07/24 (Due to visit number)  Pt will undergo cognitive testing and complete PROM in first 2 sessions Baseline: Goal status: met  2.  Pt will use trained compensations for anomic episodes in 5 minutes simple conversation x2 sessions Baseline:  Goal status: INITIAL  3.  Pt will complete SFA and /or VNEST with occasional min A x2 sessions Baseline: 02/13/24 Goal status: INITIAL  4.  Pt will use slowed rate to foster more fluid speech in sentence responses 80% of the time in 2 sessions Baseline: 02/19/24 Goal status: INITIAL   LONG TERM GOALS: Target date: 04/04/24  Pt will improve PROM Baseline:  Goal status: INITIAL  2.  Pt will use trained compensations for anomic episodes in 8 minutes simple conversation x2 sessions Baseline:  Goal status: INITIAL  3.  Pt will complete SFA and /or VNEST with rare min A x2 sessions Baseline:  Goal status: INITIAL  4.  Pt will use slowed rate to foster more fluid speech 80% of the time in 8 minutes of simple conversation in 2 sessions Baseline:  Goal status: INITIAL   ASSESSMENT:  CLINICAL IMPRESSION: See treatment date above  for today's date for further details on today's session.Patient is a 79 y.o. F who was seen today for speech/language in light of seizure and . metabolic encephalopathy with acute kidney injury.  On date of eval Berklee stated I can't come up with the word I was sometimes, and sometimes it comes out strangely. Pt also endorsed repetitions in her speech, with reduced speech fluidity since November admission. Lastly she told SLP that her husband is handling monthly bills, something pt used to do prior to hospitalization in September. Today SLP completed cognitive evaluation but suspects errors for clock drawing were visuospatial in nature and not cognitive as verbally, pt demonstrated good planning, organization, and executive function. No goals will be added. Pt will be put on hold for 4 weeks and will contact clinic if she desires more ST in the next month. If she does not contact clinic for more ST, SLP will discharge her from this ST plan of care.  OBJECTIVE IMPAIRMENTS: include attention, memory, executive functioning, and aphasia. These impairments are limiting patient from managing medications, managing finances, household responsibilities, ADLs/IADLs, and effectively communicating at home and in community. Factors affecting potential to achieve goals and functional outcome are co-morbidities. Patient will benefit from skilled SLP services to address above impairments and improve overall function.  REHAB POTENTIAL: Good  PLAN:  SLP FREQUENCY: 2x/week  SLP DURATION: 8 weeks  PLANNED INTERVENTIONS: Language facilitation, Environmental controls, Cognitive reorganization, Internal/external aids, Functional tasks, Multimodal communication approach, SLP instruction and feedback, Compensatory strategies, Patient/family education, and [id number] Treatment of speech (30 or 45 min)     Neesha Langton, CCC-SLP 03/06/2024, 9:35 AM      "

## 2024-03-08 NOTE — Telephone Encounter (Signed)
 Please call and inform husband to decrease the Vimpat  to 50 mg (1/2 tablet) twice daily and to monitor tremors.

## 2024-03-11 ENCOUNTER — Ambulatory Visit

## 2024-03-11 ENCOUNTER — Ambulatory Visit: Admitting: Physical Therapy

## 2024-03-11 ENCOUNTER — Ambulatory Visit: Admitting: Occupational Therapy

## 2024-03-11 NOTE — Telephone Encounter (Signed)
 Pt Husband called , returning call ,Informed that  he will get a call back ,

## 2024-03-11 NOTE — Telephone Encounter (Signed)
 Called the patient and left a message requesting a return call.

## 2024-03-12 NOTE — Telephone Encounter (Signed)
 Pt Husband called returning call to discuss Medication Changes , Informed  Pt Husband  He will get a callback

## 2024-03-12 NOTE — Therapy (Signed)
 " OUTPATIENT PHYSICAL THERAPY NEURO TREATMENT   Patient Name: [name] MRN: [id number] DOB:05/21/1945, 79 y.o., female Today's Date: 03/13/2024   PCP: Wendolyn Jenkins Jansky, MD  REFERRING PROVIDER: Maurice Sharlet RAMAN, PA-C  END OF SESSION:  PT End of Session - 03/13/24 1055     Visit Number 8    Number of Visits 17    Date for Recertification  03/26/24    Authorization Type Medicare/UMR/GEHA    Authorization - Number of Visits 60   combined   Progress Note Due on Visit 10    PT Start Time 1016    PT Stop Time 1059    PT Time Calculation (min) 43 min    Equipment Utilized During Treatment Gait belt    Activity Tolerance Other (comment)   limited by c/o shakiness, sweating, SOB with activity today   Behavior During Therapy Restless;Anxious              Past Medical History:  Diagnosis Date   Allergy 1980's   Sulfa drugs and penecillin   Anxiety    Cataract 2021   Corrected   Chronic kidney disease 2024   Stage 3   Depression    Headache    SINUS   Hyperlipidemia    Hypertension 2021   Osteopenia    Osteopenia    Primary localized osteoarthritis of right knee    Seizures (HCC) 10/2023   Past Surgical History:  Procedure Laterality Date   BREAST EXCISIONAL BIOPSY Right    benign cyst removal    CARPAL TUNNEL RELEASE Right 2015   Ortho in [place]   CHOLECYSTECTOMY N/A 02/08/2022   Procedure: LAPAROSCOPIC CHOLECYSTECTOMY WITH INTRAOPERATIVE CHOLANGIOGRAM;  Surgeon: Debby Hila, MD;  Location: [facility];  Service: General;  Laterality: N/A;   EYE SURGERY  12/2019   cataracts   FRACTURE SURGERY  2016   Rt.  leg   JOINT REPLACEMENT  09/2015   Right knee   LASIK     MENISCUS REPAIR Right 2011   Orthopedic in [facility]   TOTAL KNEE ARTHROPLASTY Right 09/20/2015   TOTAL KNEE ARTHROPLASTY Right 09/20/2015   Procedure: TOTAL KNEE ARTHROPLASTY;  Surgeon: Lamar Millman, MD;  Location: [facility] OR;  Service: Orthopedics;  Laterality: Right;   Trigger Thumb Right 2015    Done in [place] at the same time as the carpal tunnel release   TUBAL LIGATION  1980   Patient Active Problem List   Diagnosis Date Noted   Anxiety state 12/26/2023   Debility 12/24/2023   Dizziness 12/16/2023   Seizure (HCC) 10/23/2023   Encephalopathy, metabolic 10/23/2023   AKI (acute kidney injury) 10/23/2023   Hypokalemia 10/23/2023   Seizure-like activity (HCC) 10/17/2023   Trigger finger, left ring finger 09/04/2023   DOE (dyspnea on exertion) 03/02/2023   Stage 3 chronic kidney disease (HCC) 03/02/2023   Left carpal tunnel syndrome 07/25/2022   Primary osteoarthritis of both first carpometacarpal joints 07/25/2022   Arthritis 07/04/2022   Anxiety 07/04/2022   Depression 07/04/2022   Primary osteoarthritis involving multiple joints 06/11/2022   Aortic atherosclerosis 02/05/2022   Facial lesion 09/30/2021   Bilateral hand pain 09/30/2021   Impingement syndrome, shoulder, left 02/16/2021   Impingement syndrome, shoulder, right 09/07/2020   MDD (recurrent major depressive disorder) in remission 09/02/2019   Generalized anxiety disorder 09/02/2019   Primary insomnia 08/16/2019   Post-menopausal 05/14/2017   Prediabetes 01/12/2017   Snoring 07/20/2016   Obsessive-compulsive personality trait 12/07/2015   Primary localized osteoarthritis of  right knee    Essential hypertension 08/04/2015   Hyperlipidemia 08/04/2015   Osteopenia 08/04/2015    ONSET DATE: 12/16/23  REFERRING DIAG: G93.41 (ICD-10-CM) - Encephalopathy, metabolic  THERAPY DIAG:  Muscle weakness (generalized)  Unsteadiness on feet  Other low back pain  Rationale for Evaluation and Treatment: Rehabilitation  SUBJECTIVE:                                                                                                                                                                                             SUBJECTIVE STATEMENT: I'm fine. Just a little achy in this weather. Pt reports that she is  working on getting Psychiatry and Neurology appointments.   Pt accompanied by: self  PERTINENT HISTORY: Anxiety, CKD, depression, HA, HLD, HTN, seizures, R TKA, breast lumpectomy   PAIN:  Are you having pain? Yes: NPRS scale: 5-6/10 Pain location: B LB Pain description: discomfort, tight Aggravating factors: prolonged standing Relieving factors: heating pad  PRECAUTIONS: Fall and Other: L 4 and 5 ribs fx  RED FLAGS: None   WEIGHT BEARING RESTRICTIONS: No  FALLS: Has patient fallen in last 6 months? Yes. Number of falls 1  LIVING ENVIRONMENT: Lives with: lives with their spouse Lives in: House/apartment Stairs: 3 steps to enter back door with handrial on R; 1 story home Has following equipment at home: Single point cane, Walker - 4 wheeled, Shower bench, bed side commode, and Grab bars  PLOF: Independent, Vocation/Vocational requirements: retired, and Leisure: reading, watching sports  PATIENT GOALS: I'd like to get rid of this tight back and the walker.  OBJECTIVE:     TODAY'S TREATMENT: 03/13/24 Activity Comments  Vitals at start of session  98% spO2, 92 bpm, 148/78 mmHg  corner balance with walker in front: romberg EO/EC romberg + head turns/nods semi tandem Increased LE and UE tremor with more challenging balance tasks, cueing to ground by placing hands on walker  Gait training with quad tip cane 4x30ft 97% spO2, 118-124 bpm after 27ft of gait with CGA-min A and cueing to slow down/proper pattern, wider stance, slow down. Fluidity of movement improved with each trial  HR reduced to 98 after a few minutes of sitting     PATIENT EDUCATION: Education details: monitoring of vitals and edu on normal/abnormal vitals during sit breaks; emphasized not to ambulate with cane at home yet d/t remaining imbalance  Person educated: Patient Education method: Explanation Education comprehension: verbalized understanding   HOME EXERCISE PROGRAM Last updated:  02/11/24 Access Code: [email or web address] URL: [email or web address] Date: 02/11/2024 Prepared by: [facility] - Outpatient  Rehab - [facility]  Exercises - Seated Flexion  Stretch with [employer]  - 1 x daily - 5 x weekly - 2 sets - 10 reps - Seated Thoracic Flexion and Rotation with Swiss Ball  - 1 x daily - 5 x weekly - 2 sets - 10 reps - Supine Lower Trunk Rotation  - 1 x daily - 5 x weekly - 2 sets - 10 reps - Standing 'L' Stretch at Counter  - 1 x daily - 7 x weekly - 1-2 sets - 10 reps - Bird Dog on Counter  - 1 x daily - 3 x weekly - 3 sets - 10 reps  ALSO ADDED: sitting diaphragmatic breathing handout PRN     Note: Objective measures were completed at Evaluation unless otherwise noted.  DIAGNOSTIC FINDINGS:  12/19/23 brain MRI:  Unchanged punctate focus of restricted diffusion in the right hippocampus as described on 12/16/2023 MRI.  12/17/23 MR head angio: Normal intracranial MRA. No large vessel occlusion or other vascular abnormality. No hemodynamically significant or correctable stenosis.  12/19/23 CT angio: Acute appearing mildly displaced left fourth and fifth lateral rib fractures. Small left-sided pleural effusion with dependent atelectasis in the left base.   COGNITION: Overall cognitive status: Within functional limits for tasks assessed   SENSATION: Pt denies N/T in UE/LEs  COORDINATION: Alternating pronation/supination: WNL B Alternating toe tap: WNL B Finger to nose: tremor, slightly slowed B   MUSCLE TONE: 1-2 bears clonus in B ankles  PALPATION: TTP over posterior R and L ribs, midline LB and B lumbar paraspinals. Very tight in B paraspinals and QL   POSTURE: rounded shoulders, forward head, increased thoracic kyphosis, and R shoulder depressed Observe slight B LE tremor in standing  LOWER EXTREMITY MMT:     MMT  (in sitting) Right Eval Left Eval  Hip flexion 4- 4  Hip extension    Hip abduction 4 4  Hip adduction 4 4  Hip  internal rotation    Hip external rotation    Knee flexion 4+ 4+  Knee extension 4+ 4  Ankle dorsiflexion 4+ 4+  Ankle plantarflexion 4+ 4+  Ankle inversion    Ankle eversion     (Blank rows = not tested)  LOWER EXTREMITY ROM:    AROM Right Eval Left Eval  Hip flexion    Hip extension    Hip abduction    Hip adduction    Hip internal rotation    Hip external rotation    Knee flexion    Knee extension    Ankle dorsiflexion 29 15  Ankle plantarflexion    Ankle inversion    Ankle eversion    (Blank rows = not tested)  GAIT: Findings: Assistive device utilized:Walker - 4 wheeled, Level of assistance: Modified independence, and Comments: difficulty maneuvering in tight spaces with walker  FUNCTIONAL TESTS:  5 times sit to stand: 13.65 sec with B armrests Berg: 39/56   [facility] PT Assessment - 01/30/24 0001       Standardized Balance Assessment   Standardized Balance Assessment Berg Balance Test      Berg Balance Test   Sit to Stand Able to stand without using hands and stabilize independently    Standing Unsupported Able to stand 2 minutes with supervision    Sitting with Back Unsupported but Feet Supported on Floor or Stool Able to sit safely and securely 2 minutes    Stand to Sit Sits safely with minimal use of hands    Transfers Able to transfer safely, minor use of hands  Standing Unsupported with Eyes Closed Able to stand 10 seconds with supervision    Standing Unsupported with Feet Together Needs help to attain position but able to stand for 30 seconds with feet together    From Standing, Reach Forward with Outstretched Arm Can reach forward >12 cm safely (5)    From Standing Position, Pick up Object from Floor Able to pick up shoe, needs supervision    From Standing Position, Turn to Look Behind Over each Shoulder Looks behind one side only/other side shows less weight shift    Turn 360 Degrees Able to turn 360 degrees safely one side only in 4 seconds or less     Standing Unsupported, Alternately Place Feet on Step/Stool Able to complete >2 steps/needs minimal assist    Standing Unsupported, One Foot in Front Able to take small step independently and hold 30 seconds    Standing on One Leg Tries to lift leg/unable to hold 3 seconds but remains standing independently    Total Score 39    Berg comment: pt required 2 sit breaks                                                                                                                                      TREATMENT DATE: 01/30/24    PATIENT EDUCATION: Education details: prognosis, POC, edu on exam findings and how they relate to functional impairments, pt's goals  Person educated: Patient Education method: Explanation Education comprehension: verbalized understanding  HOME EXERCISE PROGRAM: Not yet initiated  GOALS: Goals reviewed with patient? Yes  SHORT TERM GOALS: Target date: 02/27/2024  Patient to be independent with initial HEP. Baseline: HEP initiated Goal status: MET    LONG TERM GOALS: Target date: 03/26/2024  Patient to be independent with advanced HEP. Baseline: Not yet initiated  Goal status: IN PROGRESS  Patient to demonstrate B LE strength >/=4+/5.  Baseline: See above Goal status: IN PROGRESS  6 minute walk to improve to at least 700 ft, for improved gait efficiency and endurance.  Baseline: 620 ft, 2 seated rest breaks Goal status: IN PROGRESS  Patient to ambulate 264ft without AD with good safety and stability.  Baseline: using 4WW Goal status: IN PROGRESS  Patient to demonstrate 5xSTS test in <15 sec without UE support in order to decrease risk of falls.  Baseline: 13 sec with B armrests Goal status: IN PROGRESS  Patient to score at least 45/56 on Berg in order to decrease risk of falls.  Baseline: 39 Goal status: IN PROGRESS  Patient to report tolerance for 30 minutes of chores without rest break without fatigue limiting.  Baseline: 5-7 minutes   Goal status: IN PROGRESS  ASSESSMENT:  CLINICAL IMPRESSION: Patient arrived to session with report of increased LBP d/t the weather. Vitals at start of session were [facility], thus proceeded with appointment while monitoring symptoms.  Session focused on static and dynamic balance tasks; patient  with marked increase in UE/LE tremor with balance challenges but able to perform safely with walker support. She required short sitting rest breaks d/t tremors. Patient again with elevated HR and increased tremor with trials of gait training with cane after short periods, indicating poor exertional tolerance. HR was back within normal limited upon leaving and pt without complaints at end of appointment.   OBJECTIVE IMPAIRMENTS: Abnormal gait, decreased activity tolerance, decreased balance, decreased coordination, decreased endurance, decreased mobility, difficulty walking, decreased strength, increased muscle spasms, postural dysfunction, and pain.   ACTIVITY LIMITATIONS: carrying, lifting, bending, sitting, standing, squatting, stairs, transfers, bathing, toileting, dressing, reach over head, hygiene/grooming, and locomotion level  PARTICIPATION LIMITATIONS: meal prep, cleaning, laundry, shopping, community activity, and church  PERSONAL FACTORS: Age, Fitness, Past/current experiences, Time since onset of injury/illness/exacerbation, and 3+ comorbidities: Anxiety, CKD, depression, HA, HLD, HTN, seizures, R TKA, breast lumpectomy  are also affecting patient's functional outcome.   REHAB POTENTIAL: Good  CLINICAL DECISION MAKING: Evolving/moderate complexity  EVALUATION COMPLEXITY: Moderate  PLAN:  PT FREQUENCY: 2x/week  PT DURATION: 8 weeks  PLANNED INTERVENTIONS: 97164- PT Re-evaluation, 97750- Physical Performance Testing, 97110-Therapeutic exercises, 97530- Therapeutic activity, 97112- Neuromuscular re-education, 97535- Self Care, [id number]- Manual therapy, [phone]- Gait training, [phone]- Canalith  repositioning, [phone]- Aquatic Therapy, [phone] (1-2 muscles), [phone] (3+ muscles)- Dry Needling, Patient/Family education, Balance training, Stair training, Taping, Joint mobilization, Spinal mobilization, Vestibular training, DME instructions, Cryotherapy, and Moist heat  PLAN FOR NEXT SESSION: continue monitor vitals and may consider messaging PCP about pt's elevating HR with short periods of activity before 2/6 appt  Try to initiate HEP with standing balance, hip strength, gentle lumbar mobility while being mindful of L ribs fx      Louana Terrilyn Christians, PT, DPT 03/13/24 11:00 AM  [facility] at [facility] [address], [address] [place], [place] [address] Phone # [phone] Fax # [phone]   "

## 2024-03-13 ENCOUNTER — Ambulatory Visit

## 2024-03-13 ENCOUNTER — Ambulatory Visit: Admitting: Physical Therapy

## 2024-03-13 ENCOUNTER — Encounter: Payer: Self-pay | Admitting: Physical Therapy

## 2024-03-13 DIAGNOSIS — M6281 Muscle weakness (generalized): Secondary | ICD-10-CM

## 2024-03-13 DIAGNOSIS — R2681 Unsteadiness on feet: Secondary | ICD-10-CM

## 2024-03-13 DIAGNOSIS — M5459 Other low back pain: Secondary | ICD-10-CM

## 2024-03-13 NOTE — Telephone Encounter (Signed)
Please add patient to the cancellation list

## 2024-03-16 ENCOUNTER — Other Ambulatory Visit (HOSPITAL_COMMUNITY): Payer: Self-pay

## 2024-03-16 ENCOUNTER — Other Ambulatory Visit: Payer: Self-pay

## 2024-03-17 ENCOUNTER — Other Ambulatory Visit (HOSPITAL_COMMUNITY): Payer: Self-pay

## 2024-03-17 ENCOUNTER — Other Ambulatory Visit: Payer: Self-pay

## 2024-03-18 ENCOUNTER — Ambulatory Visit

## 2024-03-18 ENCOUNTER — Encounter: Payer: Self-pay | Admitting: Physical Therapy

## 2024-03-18 ENCOUNTER — Ambulatory Visit: Admitting: Physical Therapy

## 2024-03-18 ENCOUNTER — Ambulatory Visit: Admitting: Occupational Therapy

## 2024-03-18 DIAGNOSIS — M6281 Muscle weakness (generalized): Secondary | ICD-10-CM

## 2024-03-18 DIAGNOSIS — R2681 Unsteadiness on feet: Secondary | ICD-10-CM

## 2024-03-18 DIAGNOSIS — R278 Other lack of coordination: Secondary | ICD-10-CM

## 2024-03-18 DIAGNOSIS — R251 Tremor, unspecified: Secondary | ICD-10-CM

## 2024-03-18 DIAGNOSIS — R29898 Other symptoms and signs involving the musculoskeletal system: Secondary | ICD-10-CM

## 2024-03-20 ENCOUNTER — Encounter: Payer: Self-pay | Admitting: Physical Therapy

## 2024-03-20 ENCOUNTER — Ambulatory Visit: Admitting: Physical Therapy

## 2024-03-20 ENCOUNTER — Ambulatory Visit

## 2024-03-20 DIAGNOSIS — R2681 Unsteadiness on feet: Secondary | ICD-10-CM

## 2024-03-20 DIAGNOSIS — M6281 Muscle weakness (generalized): Secondary | ICD-10-CM

## 2024-03-20 NOTE — Therapy (Signed)
 " OUTPATIENT PHYSICAL THERAPY NEURO TREATMENT/PROGRESS NOTE   Patient Name: [name] MRN: [id number] DOB:Aug 14, 1945, 79 y.o., female Today's Date: 03/20/2024   PCP: Wendolyn Jenkins Jansky, MD  REFERRING PROVIDER: Maurice Sharlet RAMAN, PA-C  Progress Note Reporting Period 01/30/2024 to 03/20/2024  See note below for Objective Data and Assessment of Progress/Goals.     END OF SESSION:  PT End of Session - 03/20/24 1021     Visit Number 10    Number of Visits 17    Date for Recertification  03/26/24    Authorization Type Medicare/UMR/GEHA    Authorization - Number of Visits 60   combined   Progress Note Due on Visit 10    PT Start Time 1021    PT Stop Time 1100    PT Time Calculation (min) 39 min    Equipment Utilized During Treatment Gait belt    Activity Tolerance Other (comment)   elevated HR and reports of sweating with activity   Behavior During Therapy Restless;Anxious                Past Medical History:  Diagnosis Date   Allergy 1980's   Sulfa drugs and penecillin   Anxiety    Cataract 2021   Corrected   Chronic kidney disease 2024   Stage 3   Depression    Headache    SINUS   Hyperlipidemia    Hypertension 2021   Osteopenia    Osteopenia    Primary localized osteoarthritis of right knee    Seizures (HCC) 10/2023   Past Surgical History:  Procedure Laterality Date   BREAST EXCISIONAL BIOPSY Right    benign cyst removal    CARPAL TUNNEL RELEASE Right 2015   Ortho in [place]   CHOLECYSTECTOMY N/A 02/08/2022   Procedure: LAPAROSCOPIC CHOLECYSTECTOMY WITH INTRAOPERATIVE CHOLANGIOGRAM;  Surgeon: Debby Hila, MD;  Location: [facility];  Service: General;  Laterality: N/A;   EYE SURGERY  12/2019   cataracts   FRACTURE SURGERY  2016   Rt.  leg   JOINT REPLACEMENT  09/2015   Right knee   LASIK     MENISCUS REPAIR Right 2011   [facility]thopedic in [facility]   TOTAL KNEE ARTHROPLASTY Right 09/20/2015   TOTAL KNEE ARTHROPLASTY Right 09/20/2015   Procedure:  TOTAL KNEE ARTHROPLASTY;  Surgeon: Lamar Millman, MD;  Location: [facility] OR;  Service: Orthopedics;  Laterality: Right;   Trigger Thumb Right 2015   Done in [place] at the same time as the carpal tunnel release   TUBAL LIGATION  1980   Patient Active Problem List   Diagnosis Date Noted   Anxiety state 12/26/2023   Debility 12/24/2023   Dizziness 12/16/2023   Seizure (HCC) 10/23/2023   Encephalopathy, metabolic 10/23/2023   AKI (acute kidney injury) 10/23/2023   Hypokalemia 10/23/2023   Seizure-like activity (HCC) 10/17/2023   Trigger finger, left ring finger 09/04/2023   DOE (dyspnea on exertion) 03/02/2023   Stage 3 chronic kidney disease (HCC) 03/02/2023   Left carpal tunnel syndrome 07/25/2022   Primary osteoarthritis of both first carpometacarpal joints 07/25/2022   Arthritis 07/04/2022   Anxiety 07/04/2022   Depression 07/04/2022   Primary osteoarthritis involving multiple joints 06/11/2022   Aortic atherosclerosis 02/05/2022   Facial lesion 09/30/2021   Bilateral hand pain 09/30/2021   Impingement syndrome, shoulder, left 02/16/2021   Impingement syndrome, shoulder, right 09/07/2020   MDD (recurrent major depressive disorder) in remission 09/02/2019   Generalized anxiety disorder 09/02/2019   Primary insomnia 08/16/2019  Post-menopausal 05/14/2017   Prediabetes 01/12/2017   Snoring 07/20/2016   Obsessive-compulsive personality trait 12/07/2015   Primary localized osteoarthritis of right knee    Essential hypertension 08/04/2015   Hyperlipidemia 08/04/2015   Osteopenia 08/04/2015    ONSET DATE: 12/16/23  REFERRING DIAG: G93.41 (ICD-10-CM) - Encephalopathy, metabolic  THERAPY DIAG:  Unsteadiness on feet  Muscle weakness (generalized)  Rationale for Evaluation and Treatment: Rehabilitation  SUBJECTIVE:                                                                                                                                                                                              SUBJECTIVE STATEMENT: Still get sweaty and shaky with any kind of activity.  Back still bothers me a little, but nothing like it did.  Using the heating pad and tennis balls helps   Pt accompanied by: self  PERTINENT HISTORY: Anxiety, CKD, depression, HA, HLD, HTN, seizures, R TKA, breast lumpectomy   PAIN:  Are you having pain? Yes: NPRS scale: 3-4/10 Pain location: B LB Pain description: discomfort, tight Aggravating factors: prolonged standing Relieving factors: heating pad  PRECAUTIONS: Fall and Other: L 4 and 5 ribs fx  RED FLAGS: None   WEIGHT BEARING RESTRICTIONS: No  FALLS: Has patient fallen in last 6 months? Yes. Number of falls 1  LIVING ENVIRONMENT: Lives with: lives with their spouse Lives in: House/apartment Stairs: 3 steps to enter back door with handrial on R; 1 story home Has following equipment at home: Single point cane, Walker - 4 wheeled, Shower bench, bed side commode, and Grab bars  PLOF: Independent, Vocation/Vocational requirements: retired, and Leisure: reading, watching sports  PATIENT GOALS: I'd like to get rid of this tight back and the walker.  OBJECTIVE:    TODAY'S TREATMENT: 03/20/2024 Activity Comments  Reviewed updates to HEP: -pelvic tilts-ant/post -lateral reach -trunk rotation  Cues to start from pelvis, not shoulders   Vitals:  97%, HR 110 bpm Manual check of HR:  104 bpm, with 2-3 skipped beats At beginning of session  FTSTS:  13.53 sec from mat surface Cues to avoid pushing surface with backs of legs  Gait with quad tip cane 50 ft x 2  Then gait with quad tip cane, 20 ft x 4 reps, simulating hallway in home  Gait with quad tip cane, at counter for additional UE support Good sequence with cane, less heavy reliance on cane Reports fatigue and needs to sit  Reports shakiness with fatigue  Vitals:  HR manually 112 bpm with 3 skipped beats   Alternating step taps at counter 2 x 10:  BUE >1  UE support,  then step tap/hover, BUE support 5 reps each leg More unsteady with hover   Standing feet together 30 sec, then partial heel to toe, 30 sec each position Light UE support at chair-pt notes increased shakiness and anxiety reported  HR manually 108 bpm, no skipped beats End of session     TREATMENT: 03/18/2024 Activity Comments  Vitals at start of session 97% sp O2, 101 bpm, 145/78   Seated pelvic tilts, 2 x 5 reps   Seated forward lean>upright posture x 5 reps Pt reports sweating; HR 109 bpm  Seated lateral same side lean and reach x 5 Seated reach across body and weightshift x 5 Seated step and reach x 5 Similar to PWR! Moves exercises for weightshift, rock, and posture  Use of tennis balls for low back massage Cues for use of towel roll for positioning in sitting   Gait training with Quad tip cane, 50 ft x 4 reps-cues for normal gait pattern and light support at cane, as pt tries to push too hard on cane, causing pt to be off balance and off sequence 97% O2, 121-124 bpm HR       PATIENT EDUCATION: Education details: 03/20/2024:  monitoring of vitals and edu on normal/abnormal vitals during sit breaks; emphasized again not to ambulate with cane at home yet d/t remaining imbalance (she is improving, but becomes more unsteady with fatigue); discussed plan to send MD progress note and info on elevated HR and skipped beats with manual HR check today.  Person educated: Patient Education method: Explanation Education comprehension: verbalized understanding   HOME EXERCISE PROGRAM Access Code: [email or web address] URL: [email or web address] Date: 03/18/2024 Prepared by: [facility] - Outpatient  Rehab - [facility]  Exercises - Seated Flexion Stretch with Swiss Ball  - 1 x daily - 5 x weekly - 2 sets - 10 reps - Seated Thoracic Flexion and Rotation with Swiss Ball  - 1 x daily - 5 x weekly - 2 sets - 10 reps - Supine Lower Trunk Rotation  - 1 x daily - 5 x weekly - 2 sets - 10 reps -  Standing 'L' Stretch at Counter  - 1 x daily - 7 x weekly - 1-2 sets - 10 reps - Bird Dog on Counter  - 1 x daily - 3 x weekly - 3 sets - 10 reps - Side Stepping with Counter Support  - 1 x daily - 4 x weekly - 3 sets - 1 min hold - Backward Walking with Counter Support  - 1 x daily - 4 x weekly - 1 sets - 1 min hold - Alternating Step Taps with Counter Support  - 1 x daily - 4 x weekly - 3 sets - 1 min hold-TRY with ONE HAND SUPPORT (03/20/24) - Seated Pelvic Tilts  - 1 x daily - 7 x weekly - 2 sets - 10 reps - Seated Balance Activity: Lateral Reaching  - 1 x daily - 7 x weekly - 2 sets - 5-10 reps - Seated Balance Activity: Rotating at Trunk  - 1 x daily - 7 x weekly - 2 sets - 5-10 reps         Note: Objective measures were completed at Evaluation unless otherwise noted.  DIAGNOSTIC FINDINGS:  12/19/23 brain MRI:  Unchanged punctate focus of restricted diffusion in the right hippocampus as described on 12/16/2023 MRI.  12/17/23 MR head angio: Normal intracranial MRA. No large vessel occlusion or other vascular abnormality. No hemodynamically significant or  correctable stenosis.  12/19/23 CT angio: Acute appearing mildly displaced left fourth and fifth lateral rib fractures. Small left-sided pleural effusion with dependent atelectasis in the left base.   COGNITION: Overall cognitive status: Within functional limits for tasks assessed   SENSATION: Pt denies N/T in UE/LEs  COORDINATION: Alternating pronation/supination: WNL B Alternating toe tap: WNL B Finger to nose: tremor, slightly slowed B   MUSCLE TONE: 1-2 bears clonus in B ankles  PALPATION: TTP over posterior R and L ribs, midline LB and B lumbar paraspinals. Very tight in B paraspinals and QL   POSTURE: rounded shoulders, forward head, increased thoracic kyphosis, and R shoulder depressed Observe slight B LE tremor in standing  LOWER EXTREMITY MMT:     MMT  (in sitting) Right Eval Left Eval  Hip flexion 4- 4   Hip extension    Hip abduction 4 4  Hip adduction 4 4  Hip internal rotation    Hip external rotation    Knee flexion 4+ 4+  Knee extension 4+ 4  Ankle dorsiflexion 4+ 4+  Ankle plantarflexion 4+ 4+  Ankle inversion    Ankle eversion     (Blank rows = not tested)  LOWER EXTREMITY ROM:    AROM Right Eval Left Eval  Hip flexion    Hip extension    Hip abduction    Hip adduction    Hip internal rotation    Hip external rotation    Knee flexion    Knee extension    Ankle dorsiflexion 29 15  Ankle plantarflexion    Ankle inversion    Ankle eversion    (Blank rows = not tested)  GAIT: Findings: Assistive device utilized:Walker - 4 wheeled, Level of assistance: Modified independence, and Comments: difficulty maneuvering in tight spaces with walker  FUNCTIONAL TESTS:  5 times sit to stand: 13.65 sec with B armrests Berg: 39/56   [facility] PT Assessment - 01/30/24 0001       Standardized Balance Assessment   Standardized Balance Assessment Berg Balance Test      Berg Balance Test   Sit to Stand Able to stand without using hands and stabilize independently    Standing Unsupported Able to stand 2 minutes with supervision    Sitting with Back Unsupported but Feet Supported on Floor or Stool Able to sit safely and securely 2 minutes    Stand to Sit Sits safely with minimal use of hands    Transfers Able to transfer safely, minor use of hands    Standing Unsupported with Eyes Closed Able to stand 10 seconds with supervision    Standing Unsupported with Feet Together Needs help to attain position but able to stand for 30 seconds with feet together    From Standing, Reach Forward with Outstretched Arm Can reach forward >12 cm safely (5)    From Standing Position, Pick up Object from Floor Able to pick up shoe, needs supervision    From Standing Position, Turn to Look Behind Over each Shoulder Looks behind one side only/other side shows less weight shift    Turn 360 Degrees Able  to turn 360 degrees safely one side only in 4 seconds or less    Standing Unsupported, Alternately Place Feet on Step/Stool Able to complete >2 steps/needs minimal assist    Standing Unsupported, One Foot in Front Able to take small step independently and hold 30 seconds    Standing on One Leg Tries to lift leg/unable to hold 3 seconds but remains  standing independently    Total Score 39    Berg comment: pt required 2 sit breaks                                                                                                                                      TREATMENT DATE: 01/30/24    PATIENT EDUCATION: Education details: prognosis, POC, edu on exam findings and how they relate to functional impairments, pt's goals  Person educated: Patient Education method: Explanation Education comprehension: verbalized understanding  HOME EXERCISE PROGRAM: Not yet initiated  GOALS: Goals reviewed with patient? Yes  SHORT TERM GOALS: Target date: 02/27/2024  Patient to be independent with initial HEP. Baseline: HEP initiated Goal status: MET    LONG TERM GOALS: Target date: 03/26/2024  Patient to be independent with advanced HEP. Baseline: Not yet initiated  Goal status: IN PROGRESS  Patient to demonstrate B LE strength >/=4+/5.  Baseline: See above Goal status: IN PROGRESS  6 minute walk to improve to at least 700 ft, for improved gait efficiency and endurance.  Baseline: 620 ft, 2 seated rest breaks Goal status: IN PROGRESS  Patient to ambulate 216ft without AD with good safety and stability.  Baseline: using 4WW; able to use quad tip cane, 50 ft x 2 prior to sitting due to fatigue Goal status: IN PROGRESS  Patient to demonstrate 5xSTS test in <15 sec without UE support in order to decrease risk of falls.  Baseline: 13 sec with B armrests; 13.5 sec without UE support from mat, 03/20/2024 Goal status: IN PROGRESS  Patient to score at least 45/56 on Berg in order to decrease  risk of falls.  Baseline: 39 Goal status: IN PROGRESS  Patient to report tolerance for 30 minutes of chores without rest break without fatigue limiting.  Baseline: 5-7 minutes  Goal status: IN PROGRESS  ASSESSMENT:  CLINICAL IMPRESSION: 10th Visit PN:  Pt presents today with reports of low back pain slightly better, but with continued fatigue/shortness of breath/shakiness/sweatiness with activity.  . Skilled PT session focused on assessing FTSTS, slightly decreased time of 13.5 seconds without UE support from mat surface.  Worked again on gait training with cane as well as standing balance exercises.  Pt is improving with gait with cane, needing less assistance and cues; however, she becomes slightly more unsteady with fatigue.  Pt notes fatigue quickly in session and at home, and she has increased shakiness, SOB, and reported anxiety/sweating.  HR at rest is >100 bpm again today; checked manually, it is >100 bpm and several skipped beats noted with several attempts at manual check of HR.  Pt is to see MD tomorrow and reports increased anxiety, knowing this today.  Continued to reinforce that the SOB and fatigue is a limiting factor in progression of activities both at home and in therapy sessions; also continued to reinforce that 4WW is safest assistive device with mobility at this time.  Pt will continue to benefit from skilled PT towards goals for improved functional mobility and decreased fall risk.   OBJECTIVE IMPAIRMENTS: Abnormal gait, decreased activity tolerance, decreased balance, decreased coordination, decreased endurance, decreased mobility, difficulty walking, decreased strength, increased muscle spasms, postural dysfunction, and pain.   ACTIVITY LIMITATIONS: carrying, lifting, bending, sitting, standing, squatting, stairs, transfers, bathing, toileting, dressing, reach over head, hygiene/grooming, and locomotion level  PARTICIPATION LIMITATIONS: meal prep, cleaning, laundry, shopping,  community activity, and church  PERSONAL FACTORS: Age, Fitness, Past/current experiences, Time since onset of injury/illness/exacerbation, and 3+ comorbidities: Anxiety, CKD, depression, HA, HLD, HTN, seizures, R TKA, breast lumpectomy  are also affecting patient's functional outcome.   REHAB POTENTIAL: Good  CLINICAL DECISION MAKING: Evolving/moderate complexity  EVALUATION COMPLEXITY: Moderate  PLAN:  PT FREQUENCY: 2x/week  PT DURATION: 8 weeks  PLANNED INTERVENTIONS: 97164- PT Re-evaluation, 97750- Physical Performance Testing, 97110-Therapeutic exercises, 97530- Therapeutic activity, [id number]- Neuromuscular re-education, 97535- Self Care, [id number]- Manual therapy, [id number]- Gait training, [phone]- Canalith repositioning, [id number]- Aquatic Therapy, [phone] (1-2 muscles), [id number] (3+ muscles)- Dry Needling, Patient/Family education, Balance training, Stair training, Taping, Joint mobilization, Spinal mobilization, Vestibular training, DME instructions, Cryotherapy, and Moist heat  PLAN FOR NEXT SESSION: Check LTGs and discuss POC; overall trying to improve balance, gait pattern with cane and progression with enduracne for gait.  Follow up from MD regarding elevated HR at rest and pt's fatigue level     Greig Anon, PT 03/20/24 10:21 AM Phone: [phone] Fax: [phone]  [facility] at [facility] [address], [address] [place], [place] [address] Phone # [phone] Fax # [phone]   "

## 2024-03-21 ENCOUNTER — Encounter: Payer: Self-pay | Admitting: Family Medicine

## 2024-03-21 ENCOUNTER — Ambulatory Visit

## 2024-03-21 ENCOUNTER — Other Ambulatory Visit (HOSPITAL_COMMUNITY): Payer: Self-pay

## 2024-03-21 ENCOUNTER — Ambulatory Visit: Admitting: Family Medicine

## 2024-03-21 VITALS — BP 122/68 | HR 74 | Temp 97.0°F | Ht 62.0 in | Wt 173.0 lb

## 2024-03-21 DIAGNOSIS — R0609 Other forms of dyspnea: Secondary | ICD-10-CM

## 2024-03-21 DIAGNOSIS — F5101 Primary insomnia: Secondary | ICD-10-CM

## 2024-03-21 DIAGNOSIS — I10 Essential (primary) hypertension: Secondary | ICD-10-CM

## 2024-03-21 DIAGNOSIS — F419 Anxiety disorder, unspecified: Secondary | ICD-10-CM

## 2024-03-21 DIAGNOSIS — F3340 Major depressive disorder, recurrent, in remission, unspecified: Secondary | ICD-10-CM

## 2024-03-21 DIAGNOSIS — I499 Cardiac arrhythmia, unspecified: Secondary | ICD-10-CM

## 2024-03-21 DIAGNOSIS — F411 Generalized anxiety disorder: Secondary | ICD-10-CM

## 2024-03-21 DIAGNOSIS — L749 Eccrine sweat disorder, unspecified: Secondary | ICD-10-CM

## 2024-03-21 MED ORDER — ZOLPIDEM TARTRATE 5 MG PO TABS
5.0000 mg | ORAL_TABLET | Freq: Every evening | ORAL | 1 refills | Status: AC | PRN
  Filled 2024-03-21: qty 30, 30d supply, fill #0

## 2024-03-21 MED ORDER — AMLODIPINE BESY-BENAZEPRIL HCL 5-20 MG PO CAPS
1.0000 | ORAL_CAPSULE | Freq: Every day | ORAL | 3 refills | Status: AC
  Filled 2024-03-21: qty 90, 90d supply, fill #0

## 2024-03-21 MED ORDER — VENLAFAXINE HCL ER 150 MG PO CP24
150.0000 mg | ORAL_CAPSULE | Freq: Every day | ORAL | 1 refills | Status: AC
  Filled 2024-03-21: qty 90, 90d supply, fill #0

## 2024-03-21 NOTE — Patient Instructions (Addendum)
 Emma Leavens, MD FASE [facility] Cardiologist [facility]  [address] [place], [address] [place], [place] [address] [phone]  Call Cardiology for appt  Referral to psych.  Zio monitor for heart is coming to the house soon,

## 2024-03-21 NOTE — Progress Notes (Unsigned)
 "  Subjective:     Patient ID: [name], female    DOB: 07/03/1945, 79 y.o.   MRN: [id number]  Chief Complaint  Patient presents with   Follow-up    Pt is here for follow up on seizure activity    Discussed the use of AI scribe software for clinical note transcription with the patient, who gave verbal consent to proceed.  History of Present Illness [name] is a 79 year old female with a history of seizures who presents with episodes of shakiness, sweating, and shortness of breath during minimal exertion. She is accompanied by her husband, Ubaldo.  She experiences episodes of shakiness, sweating, and shortness of breath with minimal exertion, such as walking from room to room. These symptoms have been ongoing since November, following a hospitalization where she was evaluated for a possible seizure or small stroke. No chest pain, but there is occasional shortness of breath and fatigue. Her physical therapist noted irregular heartbeats during a session, raising concerns about potential heart issues.  She has a history of seizures, with the last confirmed seizure occurring in September. In November, she experienced an episode that led to hospitalization, where it was unclear if it was a seizure or a small stroke. She is currently unable to drive due to the seizure history and is on a six-month hold from driving.  She underwent a comprehensive cardiac evaluation in November, including an echocardiogram. She had a stress test in January of the previous year, but she does not recall the results being communicated to her. She has not seen a cardiologist recently, despite a family history of heart disease.  Her current medications include venlafaxine  150 mg, atorvastatin , and buspirone  20 mg twice daily, which helps with anxiety. She also takes Ambien  5 mg for sleep, which she finds effective. She is scheduled for an endoscopy later this month due to anemia concerns.  She has  been participating in physical therapy and is considering transitioning from a walker to a cane. Her legs quiver when standing for long periods, and she experiences tremors in her hands and legs, which she attributes to medication or weakness.  Still get sweaty and shaky with any kind of activity   Health Maintenance Due  Topic Date Due   Medicare Annual Wellness (AWV)  08/08/2023    Past Medical History:  Diagnosis Date   Allergy 1980's   Sulfa drugs and penecillin   Anxiety    Cataract 2021   Corrected   Chronic kidney disease 2024   Stage 3   Depression    Headache    SINUS   Hyperlipidemia    Hypertension 2021   Osteopenia    Osteopenia    Primary localized osteoarthritis of right knee    Seizures (HCC) 10/2023    Past Surgical History:  Procedure Laterality Date   BREAST EXCISIONAL BIOPSY Right    benign cyst removal    CARPAL TUNNEL RELEASE Right 2015   Ortho in [place]   CHOLECYSTECTOMY N/A 02/08/2022   Procedure: LAPAROSCOPIC CHOLECYSTECTOMY WITH INTRAOPERATIVE CHOLANGIOGRAM;  Surgeon: Debby Hila, MD;  Location: [facility];  Service: General;  Laterality: N/A;   EYE SURGERY  12/2019   cataracts   FRACTURE SURGERY  2016   Rt.  leg   JOINT REPLACEMENT  09/2015   Right knee   LASIK     MENISCUS REPAIR Right 2011   Orthopedic in [facility]   TOTAL KNEE ARTHROPLASTY Right 09/20/2015   TOTAL KNEE ARTHROPLASTY  Right 09/20/2015   Procedure: TOTAL KNEE ARTHROPLASTY;  Surgeon: Lamar Millman, MD;  Location: [facility] OR;  Service: Orthopedics;  Laterality: Right;   Trigger Thumb Right 2015   Done in [facility] at the same time as the carpal tunnel release   TUBAL LIGATION  1980    Current Medications[1]  Allergies[2] ROS neg/noncontributory except as noted HPI/below      Objective:     BP 122/68 (BP Location: Left Arm, Patient Position: Sitting, Cuff Size: Normal)   Pulse 74   Temp (!) 97 F (36.1 C) (Temporal)   Ht 5' 2 (1.575 m)   Wt 173 lb (78.5 kg)   SpO2 95%    BMI 31.64 kg/m  Wt Readings from Last 3 Encounters:  03/21/24 173 lb (78.5 kg)  02/15/24 177 lb 9.6 oz (80.6 kg)  01/16/24 182 lb (82.6 kg)    Physical Exam GENERAL: Well developed, well nourished, no acute distress. HEAD EYES EARS NOSE THROAT: Normocephalic, atraumatic, conjunctiva not injected, sclera nonicteric. CARDIAC: Regular rate and rhythm, S1 S2 present, no murmur, dorsalis pedis 2 plus bilaterally. NECK: Supple, no thyromegaly, no nodes, no carotid bruits. LUNGS: Clear to auscultation bilaterally, no wheezes. ABDOMEN: Bowel sounds present, soft, non-tender, non-distended, no hepatosplenomegaly, no masses. EXTREMITIES: No edema. MUSCULOSKELETAL: No gross abnormalities. NEUROLOGICAL: Alert and oriented x3, cranial nerves II through XII intact. PSYCHIATRIC: Normal mood, good eye contact. SKIN: Skin lesion noted, possibly from previous treatment, partially detached.       Assessment & Plan:  Anxiety -     Ambulatory referral to Psychiatry  MDD (recurrent major depressive disorder) in remission -     Ambulatory referral to Psychiatry -     Venlafaxine  HCl ER; Take 1 capsule (150 mg total) by mouth daily with breakfast.  Dispense: 90 capsule; Refill: 1  Primary insomnia -     Ambulatory referral to Psychiatry -     Zolpidem  Tartrate; Take 1 tablet (5 mg total) by mouth at bedtime as needed for sleep.  Dispense: 30 tablet; Refill: 1  DOE (dyspnea on exertion) -     LONG TERM MONITOR (3-14 DAYS); Future  Sweating abnormality -     LONG TERM MONITOR (3-14 DAYS); Future  Irregular heart beat -     LONG TERM MONITOR (3-14 DAYS); Future  Generalized anxiety disorder -     Venlafaxine  HCl ER; Take 1 capsule (150 mg total) by mouth daily with breakfast.  Dispense: 90 capsule; Refill: 1  Essential hypertension -     amLODIPine  Besy-Benazepril  HCl; Take 1 capsule by mouth daily.  Dispense: 90 capsule; Refill: 3    Assessment and Plan Assessment & Plan Exertional  dyspnea, sweating, and possible cardiac arrhythmia   Intermittent exertional dyspnea, sweating, and possible cardiac arrhythmia occur with episodes of shakiness and sweating during minimal exertion. Previous EKGs and stress tests were normal, but symptoms persist. Differential includes cardiac arrhythmia, anxiety, or medication side effects. No recent cardiology follow-up. A cardiac monitor is ordered for 14 days to assess for arrhythmias during symptomatic episodes. She should press the monitor button during episodes of sweating and shaking to correlate symptoms with heart rhythm. Follow up with a cardiologist for further evaluation and potential additional testing is advised.  Generalized anxiety disorder   Anxiety symptoms may contribute to perceived cardiac symptoms. Current treatment with buspirone  20 mg twice daily appears effective. Continue buspirone  20 mg twice daily. Referred to psychiatrist Elna Main for further management of anxiety and insomnia.  Primary insomnia  Insomnia is managed with zolpidem  5 mg at bedtime, with recent improvement in sleep. Continue zolpidem  5 mg at bedtime as needed. Referred to psychiatrist Elna Main for further management of insomnia.  Seizure disorder   Recent seizure activity is under ongoing follow-up with a neurologist. Tremors are noted, possibly related to medication or other neurological issues. Driving restrictions are in place due to seizure history. Continue follow-up with a neurologist for seizure management and evaluation of tremors. Discuss tremors with the neurologist to determine if related to medication or other neurological issues.  Anemia under evaluation   Anemia is under evaluation with a planned endoscopy to investigate potential sources of bleeding. Previous tests were inconclusive. Proceed with the scheduled endoscopy to evaluate for sources of bleeding.     Return in about 3 months (around 06/18/2024) for chronic  follow-up.  Jenkins CHRISTELLA Carrel, MD     [1]  Current Outpatient Medications:    acetaminophen  (TYLENOL ) 500 MG tablet, Take 500 mg by mouth every 6 (six) hours as needed for moderate pain (pain score 4-6)., Disp: , Rfl:    aspirin  EC 81 MG tablet, Take 1 tablet (81 mg total) by mouth daily. Swallow whole., Disp: 30 tablet, Rfl: 12   atorvastatin  (LIPITOR) 20 MG tablet, Take 1 tablet (20 mg total) by mouth daily., Disp: 90 tablet, Rfl: 3   busPIRone  (BUSPAR ) 10 MG tablet, Take 2 tablets (20 mg total) by mouth 2 (two) times daily., Disp: 120 tablet, Rfl: 1   Lacosamide  100 MG TABS, Take 1 tablet (100 mg total) by mouth 2 (two) times daily., Disp: 60 tablet, Rfl: 5   Multiple Vitamins-Minerals (MULTIVITAMIN WOMEN 50+) TABS, Take 1 tablet by mouth daily., Disp: , Rfl:    Na Sulfate-K Sulfate-Mg Sulfate concentrate (SUPREP) 17.5-3.13-1.6 GM/177ML SOLN, Follow instructions provider by  GI, Disp: 354 mL, Rfl: 0   omeprazole  (PRILOSEC) 40 MG capsule, Take 1 capsule (40 mg total) by mouth daily., Disp: 90 capsule, Rfl: 1   OVER THE COUNTER MEDICATION, Take 1 capsule by mouth every 12 (twelve) hours as needed (headache, cold/flu-like symptoms). OTC cold capsules, Disp: , Rfl:    Polyethyl Glycol-Propyl Glycol 0.4-0.3 % SOLN, Place 1 drop into both eyes at bedtime., Disp: , Rfl:    potassium chloride  (KLOR-CON  M) 10 MEQ tablet, Take 1 tablet (10 mEq total) by mouth daily., Disp: 90 tablet, Rfl: 0   raloxifene  (EVISTA ) 60 MG tablet, Take 1 tablet (60 mg total) by mouth daily., Disp: 90 tablet, Rfl: 3   amLODipine -benazepril  (LOTREL ) 5-20 MG capsule, Take 1 capsule by mouth daily., Disp: 90 capsule, Rfl: 3   venlafaxine  XR (EFFEXOR -XR) 150 MG 24 hr capsule, Take 1 capsule (150 mg total) by mouth daily with breakfast., Disp: 90 capsule, Rfl: 1   zolpidem  (AMBIEN ) 5 MG tablet, Take 1 tablet (5 mg total) by mouth at bedtime as needed for sleep., Disp: 30 tablet, Rfl: 1 [2]  Allergies Allergen Reactions    Penicillins Hives    Tolerates Cephalosporins    Sulfa Antibiotics Hives        Prednisone  Other (See Comments)    Hyperactivity Sleep disturbance Confusion   "

## 2024-03-21 NOTE — Progress Notes (Unsigned)
 EP to read.

## 2024-03-25 ENCOUNTER — Ambulatory Visit: Admitting: Physical Therapy

## 2024-03-25 ENCOUNTER — Ambulatory Visit: Admitting: Occupational Therapy

## 2024-03-25 ENCOUNTER — Ambulatory Visit

## 2024-03-27 ENCOUNTER — Ambulatory Visit: Admitting: Physical Therapy

## 2024-03-27 ENCOUNTER — Ambulatory Visit: Payer: Self-pay | Admitting: Neurology

## 2024-03-27 ENCOUNTER — Ambulatory Visit

## 2024-04-08 ENCOUNTER — Encounter: Admitting: Gastroenterology

## 2024-06-09 ENCOUNTER — Ambulatory Visit: Admitting: Neurology

## 2024-06-11 ENCOUNTER — Ambulatory Visit: Admitting: Physician Assistant

## 2024-06-18 ENCOUNTER — Ambulatory Visit: Admitting: Family Medicine
